# Patient Record
Sex: Female | Born: 1945 | Race: White | Hispanic: No | Marital: Married | Smoking: Never smoker
Health system: Southern US, Community
[De-identification: ages and names within clinical notes are randomized; demographics above are authoritative.]

## PROBLEM LIST (undated history)

## (undated) DIAGNOSIS — Z8709 Personal history of other diseases of the respiratory system: Secondary | ICD-10-CM

## (undated) DIAGNOSIS — K219 Gastro-esophageal reflux disease without esophagitis: Secondary | ICD-10-CM

## (undated) DIAGNOSIS — Z8679 Personal history of other diseases of the circulatory system: Secondary | ICD-10-CM

## (undated) DIAGNOSIS — M5030 Other cervical disc degeneration, unspecified cervical region: Secondary | ICD-10-CM

## (undated) DIAGNOSIS — G459 Transient cerebral ischemic attack, unspecified: Secondary | ICD-10-CM

## (undated) DIAGNOSIS — I10 Essential (primary) hypertension: Secondary | ICD-10-CM

## (undated) DIAGNOSIS — E785 Hyperlipidemia, unspecified: Secondary | ICD-10-CM

## (undated) DIAGNOSIS — Z8701 Personal history of pneumonia (recurrent): Secondary | ICD-10-CM

## (undated) DIAGNOSIS — R011 Cardiac murmur, unspecified: Secondary | ICD-10-CM

## (undated) DIAGNOSIS — D649 Anemia, unspecified: Secondary | ICD-10-CM

## (undated) DIAGNOSIS — G629 Polyneuropathy, unspecified: Secondary | ICD-10-CM

## (undated) DIAGNOSIS — N183 Chronic kidney disease, stage 3 unspecified: Secondary | ICD-10-CM

## (undated) DIAGNOSIS — C541 Malignant neoplasm of endometrium: Secondary | ICD-10-CM

## (undated) DIAGNOSIS — Z9289 Personal history of other medical treatment: Secondary | ICD-10-CM

## (undated) DIAGNOSIS — J189 Pneumonia, unspecified organism: Secondary | ICD-10-CM

## (undated) DIAGNOSIS — J45909 Unspecified asthma, uncomplicated: Secondary | ICD-10-CM

## (undated) DIAGNOSIS — Z951 Presence of aortocoronary bypass graft: Secondary | ICD-10-CM

## (undated) DIAGNOSIS — M1990 Unspecified osteoarthritis, unspecified site: Secondary | ICD-10-CM

## (undated) DIAGNOSIS — E119 Type 2 diabetes mellitus without complications: Secondary | ICD-10-CM

## (undated) DIAGNOSIS — H9190 Unspecified hearing loss, unspecified ear: Secondary | ICD-10-CM

## (undated) DIAGNOSIS — R0600 Dyspnea, unspecified: Secondary | ICD-10-CM

## (undated) DIAGNOSIS — I639 Cerebral infarction, unspecified: Secondary | ICD-10-CM

## (undated) DIAGNOSIS — C649 Malignant neoplasm of unspecified kidney, except renal pelvis: Secondary | ICD-10-CM

## (undated) DIAGNOSIS — I2510 Atherosclerotic heart disease of native coronary artery without angina pectoris: Secondary | ICD-10-CM

## (undated) DIAGNOSIS — R7881 Bacteremia: Secondary | ICD-10-CM

## (undated) HISTORY — DX: Anemia, unspecified: D64.9

## (undated) HISTORY — PX: COLONOSCOPY: SHX174

## (undated) HISTORY — PX: CHOLECYSTECTOMY: SHX55

## (undated) HISTORY — DX: Personal history of pneumonia (recurrent): Z87.01

## (undated) HISTORY — PX: TUBAL LIGATION: SHX77

## (undated) HISTORY — PX: TONSILLECTOMY: SUR1361

## (undated) HISTORY — DX: Atherosclerotic heart disease of native coronary artery without angina pectoris: I25.10

## (undated) HISTORY — PX: WISDOM TOOTH EXTRACTION: SHX21

## (undated) HISTORY — DX: Bacteremia: R78.81

## (undated) HISTORY — DX: Hyperlipidemia, unspecified: E78.5

## (undated) HISTORY — DX: Presence of aortocoronary bypass graft: Z95.1

## (undated) HISTORY — DX: Chronic kidney disease, stage 3 unspecified: N18.30

## (undated) HISTORY — DX: Personal history of other diseases of the circulatory system: Z86.79

## (undated) HISTORY — DX: Personal history of other diseases of the respiratory system: Z87.09

## (undated) HISTORY — DX: Essential (primary) hypertension: I10

## (undated) HISTORY — PX: UPPER GI ENDOSCOPY: SHX6162

## (undated) HISTORY — DX: Type 2 diabetes mellitus without complications: E11.9

## (undated) HISTORY — PX: CAROTID ENDARTERECTOMY: SUR193

## (undated) HISTORY — DX: Malignant neoplasm of unspecified kidney, except renal pelvis: C64.9

## (undated) HISTORY — DX: Chronic kidney disease, stage 3 (moderate): N18.3

## (undated) HISTORY — PX: EYE SURGERY: SHX253

---

## 2003-07-22 ENCOUNTER — Ambulatory Visit (HOSPITAL_COMMUNITY): Admission: RE | Admit: 2003-07-22 | Discharge: 2003-07-22 | Payer: Self-pay | Admitting: Gastroenterology

## 2003-09-04 ENCOUNTER — Encounter: Admission: RE | Admit: 2003-09-04 | Discharge: 2003-09-04 | Payer: Self-pay | Admitting: Family Medicine

## 2005-09-08 ENCOUNTER — Inpatient Hospital Stay (HOSPITAL_COMMUNITY): Admission: RE | Admit: 2005-09-08 | Discharge: 2005-09-09 | Payer: Self-pay | Admitting: Neurology

## 2005-09-08 ENCOUNTER — Encounter: Payer: Self-pay | Admitting: Cardiology

## 2005-09-08 ENCOUNTER — Ambulatory Visit: Payer: Self-pay | Admitting: Cardiology

## 2005-10-11 ENCOUNTER — Inpatient Hospital Stay (HOSPITAL_COMMUNITY): Admission: RE | Admit: 2005-10-11 | Discharge: 2005-10-13 | Payer: Self-pay | Admitting: Neurosurgery

## 2005-10-11 HISTORY — PX: ANTERIOR CERVICAL DECOMP/DISCECTOMY FUSION: SHX1161

## 2006-05-12 ENCOUNTER — Ambulatory Visit (HOSPITAL_COMMUNITY): Admission: RE | Admit: 2006-05-12 | Discharge: 2006-05-12 | Payer: Self-pay | Admitting: Neurosurgery

## 2007-07-26 HISTORY — PX: NM MYOCAR SINGLE W/SPECT: HXRAD625

## 2008-10-11 ENCOUNTER — Encounter: Admission: RE | Admit: 2008-10-11 | Discharge: 2008-10-11 | Payer: Self-pay | Admitting: Nephrology

## 2008-11-18 ENCOUNTER — Inpatient Hospital Stay (HOSPITAL_COMMUNITY): Admission: RE | Admit: 2008-11-18 | Discharge: 2008-11-26 | Payer: Self-pay | Admitting: Urology

## 2008-11-18 ENCOUNTER — Encounter (INDEPENDENT_AMBULATORY_CARE_PROVIDER_SITE_OTHER): Payer: Self-pay | Admitting: Urology

## 2008-11-18 DIAGNOSIS — C649 Malignant neoplasm of unspecified kidney, except renal pelvis: Secondary | ICD-10-CM

## 2008-11-18 HISTORY — PX: PARTIAL NEPHRECTOMY: SHX414

## 2008-11-18 HISTORY — DX: Malignant neoplasm of unspecified kidney, except renal pelvis: C64.9

## 2008-12-04 ENCOUNTER — Inpatient Hospital Stay (HOSPITAL_COMMUNITY): Admission: EM | Admit: 2008-12-04 | Discharge: 2008-12-07 | Payer: Self-pay | Admitting: Emergency Medicine

## 2008-12-31 ENCOUNTER — Encounter (HOSPITAL_COMMUNITY): Admission: RE | Admit: 2008-12-31 | Discharge: 2009-03-19 | Payer: Self-pay | Admitting: Nephrology

## 2009-06-04 ENCOUNTER — Ambulatory Visit (HOSPITAL_COMMUNITY): Admission: RE | Admit: 2009-06-04 | Discharge: 2009-06-04 | Payer: Self-pay | Admitting: Urology

## 2009-12-19 ENCOUNTER — Ambulatory Visit (HOSPITAL_COMMUNITY)
Admission: RE | Admit: 2009-12-19 | Discharge: 2009-12-19 | Payer: Self-pay | Source: Home / Self Care | Attending: Urology | Admitting: Urology

## 2010-04-15 LAB — HEMOGLOBIN: Hemoglobin: 7.4 g/dL — ABNORMAL LOW (ref 12.0–15.0)

## 2010-04-15 LAB — PROTIME-INR
INR: 1.08 (ref 0.00–1.49)
Prothrombin Time: 13.9 seconds (ref 11.6–15.2)

## 2010-04-15 LAB — HEMOGLOBIN AND HEMATOCRIT, BLOOD
HCT: 22.1 % — ABNORMAL LOW (ref 36.0–46.0)
HCT: 19.8 % — ABNORMAL LOW (ref 36.0–46.0)
Hemoglobin: 7.6 g/dL — ABNORMAL LOW (ref 12.0–15.0)
Hemoglobin: 7.1 g/dL — ABNORMAL LOW (ref 12.0–15.0)

## 2010-04-15 LAB — CBC
HCT: 28 % — ABNORMAL LOW (ref 36.0–46.0)
Hemoglobin: 7.6 g/dL — ABNORMAL LOW (ref 12.0–15.0)
MCHC: 34.6 g/dL (ref 30.0–36.0)
MCHC: 34.8 g/dL (ref 30.0–36.0)
MCHC: 35 g/dL (ref 30.0–36.0)
MCHC: 34.6 g/dL (ref 30.0–36.0)
MCHC: 35.5 g/dL (ref 30.0–36.0)
MCV: 94.8 fL (ref 78.0–100.0)
MCV: 96.8 fL (ref 78.0–100.0)
MCV: 97.1 fL (ref 78.0–100.0)
Platelets: 264 10*3/uL (ref 150–400)
Platelets: 271 10*3/uL (ref 150–400)
Platelets: 104 10*3/uL — ABNORMAL LOW (ref 150–400)
Platelets: 123 10*3/uL — ABNORMAL LOW (ref 150–400)
Platelets: 92 10*3/uL — ABNORMAL LOW (ref 150–400)
RBC: 2.25 MIL/uL — ABNORMAL LOW (ref 3.87–5.11)
RBC: 2.52 MIL/uL — ABNORMAL LOW (ref 3.87–5.11)
RBC: 2.96 MIL/uL — ABNORMAL LOW (ref 3.87–5.11)
RBC: 2.97 MIL/uL — ABNORMAL LOW (ref 3.87–5.11)
RBC: 2.42 MIL/uL — ABNORMAL LOW (ref 3.87–5.11)
RDW: 12.4 % (ref 11.5–15.5)
RDW: 13.5 % (ref 11.5–15.5)
RDW: 13.7 % (ref 11.5–15.5)
WBC: 7.1 10*3/uL (ref 4.0–10.5)
WBC: 8.1 10*3/uL (ref 4.0–10.5)
WBC: 10.4 10*3/uL (ref 4.0–10.5)
WBC: 5.9 10*3/uL (ref 4.0–10.5)

## 2010-04-15 LAB — BASIC METABOLIC PANEL
BUN: 24 mg/dL — ABNORMAL HIGH (ref 6–23)
BUN: 27 mg/dL — ABNORMAL HIGH (ref 6–23)
BUN: 30 mg/dL — ABNORMAL HIGH (ref 6–23)
BUN: 33 mg/dL — ABNORMAL HIGH (ref 6–23)
BUN: 35 mg/dL — ABNORMAL HIGH (ref 6–23)
CO2: 26 mEq/L (ref 19–32)
CO2: 24 mEq/L (ref 19–32)
CO2: 20 mEq/L (ref 19–32)
CO2: 23 mEq/L (ref 19–32)
CO2: 23 mEq/L (ref 19–32)
CO2: 23 mEq/L (ref 19–32)
CO2: 24 mEq/L (ref 19–32)
CO2: 25 mEq/L (ref 19–32)
CO2: 25 mEq/L (ref 19–32)
CO2: 28 mEq/L (ref 19–32)
Calcium: 8 mg/dL — ABNORMAL LOW (ref 8.4–10.5)
Calcium: 8.1 mg/dL — ABNORMAL LOW (ref 8.4–10.5)
Calcium: 8.1 mg/dL — ABNORMAL LOW (ref 8.4–10.5)
Calcium: 8.1 mg/dL — ABNORMAL LOW (ref 8.4–10.5)
Calcium: 8.1 mg/dL — ABNORMAL LOW (ref 8.4–10.5)
Calcium: 8.3 mg/dL — ABNORMAL LOW (ref 8.4–10.5)
Chloride: 100 mEq/L (ref 96–112)
Chloride: 97 mEq/L (ref 96–112)
Chloride: 112 mEq/L (ref 96–112)
Chloride: 103 mEq/L (ref 96–112)
Chloride: 105 mEq/L (ref 96–112)
Chloride: 106 mEq/L (ref 96–112)
Chloride: 108 mEq/L (ref 96–112)
Chloride: 108 mEq/L (ref 96–112)
Chloride: 108 mEq/L (ref 96–112)
Creatinine, Ser: 2.06 mg/dL — ABNORMAL HIGH (ref 0.4–1.2)
Creatinine, Ser: 1.78 mg/dL — ABNORMAL HIGH (ref 0.4–1.2)
Creatinine, Ser: 1.96 mg/dL — ABNORMAL HIGH (ref 0.4–1.2)
Creatinine, Ser: 2.03 mg/dL — ABNORMAL HIGH (ref 0.4–1.2)
Creatinine, Ser: 2.18 mg/dL — ABNORMAL HIGH (ref 0.4–1.2)
Creatinine, Ser: 2.34 mg/dL — ABNORMAL HIGH (ref 0.4–1.2)
Creatinine, Ser: 2.64 mg/dL — ABNORMAL HIGH (ref 0.4–1.2)
Creatinine, Ser: 2.68 mg/dL — ABNORMAL HIGH (ref 0.4–1.2)
GFR calc Af Amer: 31 mL/min — ABNORMAL LOW (ref >60)
GFR calc Af Amer: 35 mL/min — ABNORMAL LOW (ref >60)
GFR calc Af Amer: 29 mL/min — ABNORMAL LOW (ref >60)
GFR calc Af Amer: 28 mL/min — ABNORMAL LOW (ref >60)
GFR calc Af Amer: 29 mL/min — ABNORMAL LOW (ref >60)
GFR calc Af Amer: 30 mL/min — ABNORMAL LOW (ref >60)
GFR calc Af Amer: 31 mL/min — ABNORMAL LOW (ref >60)
GFR calc non Af Amer: 29 mL/min — ABNORMAL LOW (ref >60)
GFR calc non Af Amer: 24 mL/min — ABNORMAL LOW (ref >60)
GFR calc non Af Amer: 25 mL/min — ABNORMAL LOW (ref >60)
GFR calc non Af Amer: 26 mL/min — ABNORMAL LOW (ref >60)
Glucose, Bld: 150 mg/dL — ABNORMAL HIGH (ref 70–99)
Glucose, Bld: 116 mg/dL — ABNORMAL HIGH (ref 70–99)
Glucose, Bld: 118 mg/dL — ABNORMAL HIGH (ref 70–99)
Glucose, Bld: 159 mg/dL — ABNORMAL HIGH (ref 70–99)
Glucose, Bld: 176 mg/dL — ABNORMAL HIGH (ref 70–99)
Potassium: 4.5 mEq/L (ref 3.5–5.1)
Potassium: 3.7 mEq/L (ref 3.5–5.1)
Potassium: 3.3 mEq/L — ABNORMAL LOW (ref 3.5–5.1)
Potassium: 3.4 mEq/L — ABNORMAL LOW (ref 3.5–5.1)
Sodium: 134 mEq/L — ABNORMAL LOW (ref 135–145)
Sodium: 130 mEq/L — ABNORMAL LOW (ref 135–145)
Sodium: 139 mEq/L (ref 135–145)
Sodium: 141 mEq/L (ref 135–145)

## 2010-04-15 LAB — URINALYSIS, ROUTINE W REFLEX MICROSCOPIC
Bilirubin Urine: NEGATIVE
Glucose, UA: NEGATIVE mg/dL
Hgb urine dipstick: NEGATIVE
Nitrite: NEGATIVE
Specific Gravity, Urine: 1.011 (ref 1.005–1.030)
pH: 7.5 (ref 5.0–8.0)

## 2010-04-15 LAB — CROSSMATCH

## 2010-04-15 LAB — DIFFERENTIAL
Basophils Absolute: 0 10*3/uL (ref 0.0–0.1)
Basophils Relative: 0 % (ref 0–1)
Eosinophils Absolute: 0.1 10*3/uL (ref 0.0–0.7)
Eosinophils Relative: 2 % (ref 0–5)
Monocytes Absolute: 0.5 10*3/uL (ref 0.1–1.0)
Monocytes Relative: 9 % (ref 3–12)

## 2010-04-15 LAB — URINE MICROSCOPIC-ADD ON

## 2010-04-15 LAB — HEMATOCRIT: HCT: 20.9 % — ABNORMAL LOW (ref 36.0–46.0)

## 2010-04-15 LAB — TYPE AND SCREEN: ABO/RH(D): A POS

## 2010-04-15 LAB — APTT: aPTT: 30 seconds (ref 24–37)

## 2010-04-15 LAB — ABO/RH: ABO/RH(D): A POS

## 2010-05-29 NOTE — Procedures (Signed)
HISTORY:  [age]-year-old woman with a history of fall and subarachnoid  hemorrhage.  Study is being done to look for the presence of seizures.   PROCEDURE:  The procedure is carried out on a 32-channel digital Cadwell  recorder reformatted into 16-channel montages with one devoted to EKG.  The  patient was awake and drowsy during the recording.  The International 7/20  system lead placement was used.   DESCRIPTION OF FINDINGS:  Dominant frequency is in 10-20 microvolt 8 Hz  activity that is well regulated.  Background activity shows mixed frequency  theta range components.  The patient appears to be somewhat drowsy at this  time.  Light natural sleep was not achieved.  The patient was aroused with  agitation and significant motion artifact.  Photic stimulation was not well  seen.  EKG showed regular sinus rhythm with ventricular response of 66 beats  per minute.   IMPRESSION:  Normal record with the patient awake and drowsy.      Princess Bruins. Gaynell Face, M.D.  Electronically Signed     [id number]  D:  09/09/2005 13:28:44  T:  09/10/2005 08:23:09  Job #:  [id number]   cc:   Hosie Spangle, M.D.  Fax: [phone]   Alyson Locket. Love, M.D.  Fax: [phone]

## 2010-05-29 NOTE — Op Note (Signed)
NAME:  Dana Jackson, Dana Jackson                        ACCOUNT NO.:  [id number]   MEDICAL RECORD NO.:  [id number]                   PATIENT TYPE:  AMB   LOCATION:  ENDO                                 FACILITY:  [facility]   PHYSICIAN:  Nelwyn Salisbury, M.D.               DATE OF BIRTH:  Mar 03, 1945   DATE OF PROCEDURE:  07/22/2003  DATE OF DISCHARGE:                                 OPERATIVE REPORT   PROCEDURE PERFORMED:  Screening colonoscopy.   ENDOSCOPIST:  Nelwyn Salisbury, M.D.   INSTRUMENT USED:  Olympus video colonoscope.   INDICATION FOR PROCEDURE:  A 65 year old white female with a history of  occasional BRBPR, undergoing a screening colonoscopy.  Rule out colonic  polyps, masses, etc.   PREPROCEDURE PREPARATION:  Informed consent was procured from the patient.  The patient was fasted for eight hours prior to the procedure and prepped  with a bottle of magnesium citrate and a gallon of GoLYTELY the night prior  to the procedure.   PREPROCEDURE PHYSICAL:  VITAL SIGNS:  The patient had stable vital signs.  NECK:  Supple.  CHEST:  Clear to auscultation.  S1, S2 regular.  ABDOMEN:  Soft with normal bowel sounds.   DESCRIPTION OF PROCEDURE:  The patient was placed in the left lateral  decubitus position and sedated with 50 mg of Demerol and 5 mg of Versed in  slow incremental doses.  Once the patient was adequately sedate and  maintained on low-flow oxygen and continuous cardiac monitoring, the Olympus  video colonoscope was advanced from the rectum to the cecum.  The  appendiceal orifice and the ileocecal valve were visualized and  photographed.  There was some residual stool in the colon.  Multiple washes  were done.  No masses, polyps, erosions, ulcerations, or diverticula were  seen.  Small lesions could have been missed.  Retroflexion in the rectum  revealed small hemorrhoids.  The patient tolerated the procedure well  without immediate complications.   IMPRESSION:  1.  Essentially unrevealing colonoscopy up to the cecum except for small     internal hemorrhoids.  2. Some residual stool in the colon.  Small lesions could have been missed.   RECOMMENDATIONS:  1. Continue a high-fiber diet with liberal fluid intake.  2. Repeat colonoscopy in the next five to 10 years unless the patient     develops any abnormal symptoms in the     interim.  3. Outpatient follow-up if rectal bleeding continues in spite of soft     stools.  4. Use stool softeners and avoid laxatives as needed.                                               Nelwyn Salisbury, M.D.    JNM/MEDQ  D:  07/22/2003  T:  07/22/2003  Job:  [id number]   cc:   Youlanda Roys. Deatra Ina, M.D.  [address]  [place]  [place] [address]  Fax: [phone]   Stanford Scotland, P.A.-C.  [facility]

## 2010-05-29 NOTE — H&P (Signed)
[name], [name]:  [id number]   MEDICAL RECORD NO.:  [id number]          PATIENT TYPE:  INP   LOCATION:  2852                         FACILITY:  [facility]   PHYSICIAN:  Alyson Locket. Love, M.D.    DATE OF BIRTH:  29-May-1945   DATE OF ADMISSION:  09/08/2005  DATE OF DISCHARGE:                                HISTORY & PHYSICAL   This is one of several [facility] admissions for this 65 year old,  right-handed, white, married female admitted from short stay for evaluation  of aphasia and poor riding ability.   HISTORY OF PRESENT ILLNESS:  Dana Jackson has a known history of cervical spine  and lumbar spine disease and was in short stay to be admitted for anterior  diskectomy at C3-4, 4-5, 5-6 and 6-7 with fusion in the neck.  She had had a  history of neck and left arm pain with left hand numbness.  She fell  approximately 9 days prior to admission by tripping and struck her head  against the tank of a commode which cracked the tank.  She had no loss of  consciousness at the time.  Yesterday, she was noted to have problems with  her writing and was confused and today was reported to be confused by her  husband.  She had difficulty in getting the words out for her medications  when she came to the short stay to be preadmitted for surgery.  She had no  associated chest pain, palpitations or amaurosis fugax with this.   PAST MEDICAL HISTORY:  Significant for hypertension for 20 years, borderline  diabetes mellitus, TIA 25 years ago associated with right-sided numbness and  weakness with problems talking. Results of that evaluation are unknown.  She  had a cholecystectomy 30 years ago, tubal ligation, forehead cyst removal,  cervical disk disease and lumbar disk disease.  She has had hyperlipidemia.   SOCIAL HISTORY:  She is married and does not smoke cigarettes or drink  alcohol.  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   ALLERGIES:  She has an allergy to CODEINE causing nausea and vomiting and an  allergy to ERYTHROMYCIN causing nausea and vomiting.   MEDICATIONS:  1. Vitamin E 400 international units per day.  2. Multivitamins one daily.  3. Adderall XR 20 mg daily.  4  Etodolac 400 mg b.i.d.  1. Caltrate 600 mg b.i.d.  2. Aspirin 81 mg daily.  3. Lasix 1/2 of a 40 mg tablet daily.  4. Atenolol 50 mg daily.  5. Niaspan 2000 mg q.h.s.  6. Simvastatin 40 mg q.p.m.  7. Tramadol 50 mg q. a.m.   FAMILY HISTORY:  Mother died at 60 from kidney disease and multiple myeloma.  Her father is 29 and living well.  She has a brother 79, sister 68 and a  sister 70 living and well.   PHYSICAL EXAMINATION:  GENERAL:  Revealed a well-developed white female.  VITAL SIGNS:  Blood pressure right arm 150/80, left arm 150/80, heart rate  was 64.  NECK:  There were no bruits.  MENTAL STATUS:  She was alert and oriented x3.  CRANIAL NERVES:  Examination revealed visual fields full, disks flat.  Extraocular movements full.  Corneals present.  No seventh nerve palsy.  Tongue midline.  Uvula midline.  Gags present.  Sternocleidomastoid and  trapezius testing normal.  MOTOR EXAMINATION:  Revealed 5/5 strength in the upper and lower extremities  except for 4+/5 strength in the right arm. She had a mild outstretched hand  and arm tremor.  She had increased reflexes in her right arm and decreased  reflexes in her left arm and 2+ knee jerks and plantar responses that were  downgoing.   General examination revealed the tympanic membranes not seen. The thyroid  was not enlarged. Both lungs were listened to and there was no abnormal  sounds. The heart was without murmurs. Bowel sounds were normal. There was  no enlargement of the liver, spleen or kidneys.   LABORATORY DATA:  Cervical spine MRI was reviewed from [facility] showing swan-neck deformity with degenerative disk disease at  C3-  4, 4-5, 5-6 and 6-7 with spur formation as well narrowing the left C3-4 and  bilaterally the C5-6 formation. 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  CT scan of the brain showed  bilateral left greater than the right subcortical white matter disease. MRA  of the brain by MRI testing was normal. Extracranial MRA showed no  abnormalities.  White blood cell count was 9100, hemoglobin 13.1, hematocrit  36.9 platelets 240K. Sodium is 139, potassium 3.9, chloride 108, CO2 content  25, BUN 24, creatinine 1.2, glucose 93 on September 06, 2005.  EKG revealed  normal sinus rhythm.   IMPRESSION:  Left brain transient ischemic attack with aphasia, code 435.9  and  784.3.   PLAN:  Admit the patient for further evaluation.           ______________________________  Alyson Locket. Erling Cruz, M.D.     JML/MEDQ  D:  09/08/2005  T:  09/08/2005  Job:  [id number]

## 2010-05-29 NOTE — Discharge Summary (Signed)
NAMEELIAS, HENIGAN              ACCOUNT NO.:  [id number]   MEDICAL RECORD NO.:  [id number]          PATIENT TYPE:  INP   LOCATION:  3012                         FACILITY:  [facility]   PHYSICIAN:  Pramod P. Leonie Man, MD    DATE OF BIRTH:  [birth year]   DATE OF ADMISSION:  09/08/2005  DATE OF DISCHARGE:  09/09/2005                                 DISCHARGE SUMMARY   DIAGNOSES AT TIME OF DISCHARGE:  1. Left brain transient ischemic attack.  2. Recent fall.  3. Cervical spine disease, requiring surgery within the next two to three      weeks.  4. Hypertension x20 years.  5. Borderline diabetes.  6. Transient ischemic attack 25 years ago, associated with right-sided      numbness and weakness with problems talking.  7. Cholecystectomy 30 years ago.  8. Tubal ligation.  9. Forehead cyst removal.  10.Cervical disk disease.  11.Lumbar disk disease.  12.Dyslipidemia.   MEDICINES AT TIME OF DISCHARGE:  1. Aspirin 325 mg a day.  2. Etodolac 400 mg b.i.d.  3. Atenolol 40 mg a day.  4. Lasix 20 mg a day.  5. Tramadol 50 mg 1 to 2 q.6 hours p.r.n.  6. Adderall XR 20 mg a day.  7. Niaspan 2000 mg at bedtime.  8. Simvastatin 40 mg at bedtime.  9. Multivitamin 1 a day.  10.Vitamin E 4000 units 1 a day.  11.Caltrate 600 plus D b.i.d.   STUDIES PERFORMED:  1. CT of the brain on admission.  Shows no acute abnormality.  Low density      scattered throughout the periventricular and subcortical white matter      disease are likely insignificant.  2. MRI of the brain.  Shows chronic ischemic changes.  No acute infarct.  3. MRA of the neck.  Shows proximal vertebral arteries are not well seen,      but appear to be widely patent to the base.  No carotid stenosis is      identified.  4. MRA of the head.  Negative.  5. Carotid Doppler.  Shows no ICA stenosis.  6. 2-D echocardiogram.  Shows normal ejection fraction and no embolic      source.  7. EKG shows normal sinus rhythm.   LABORATORY  STUDIES:  Show potassium 3.4, total protein 5.8, albumin 3.3.  Coagulation study is normal.  Hemoglobin A1c normal.  Amathrobin-3 70, sed  rate 28.  Hypercoagulable panel pending.  Homocysteine 7.5.  Lipids  anticoagulant not detected.  Protein C function was high at 196.  RPR  negative.  Urinalysis with moderate bilirubin.  Urine drug screen positive  for amphetamines.  CBC with red blood cells 3.81, hematocrit 35.1.  Cholesterol 219, triglycerides 268, HDL 36, and LDL 131.   HISTORY OF PRESENT ILLNESS:  [name] is a 65 year old right-  handed white female, who has a known history of cervical spine and lumbar  spine disease and was in short-stay to be admitted for anterior diskectomy  at C3-4, 4-5, 5-6, and 6-7 with fusion in the neck.  She had a history  of  neck and left arm pain, with left-hand numbness.  She fell approximately 9  days prior to admission, by tripping and struck her head against the tank of  a commode, which cracked the tank.  She had no loss of consciousness at that  time.  Yesterday, she was noted to have problems with her writing and was  confused, and today was reported to be confused by her husband.  She had  difficulty in getting her words out her medications, when she came to the  short-stay to be pre-admitted for surgery.  She had no associated chest  pain, palpitations, or amaurosis fugax with this.  She was not a TPA  candidate secondary to time.  She was admitted to the hospital for further  stroke evaluation.   HOSPITAL COURSE:  MRI was negative for acute infarct.  She has vascular  factors of hypertension, dyslipidemia, and borderline diabetes.  Her  hemoglobin A1c was normal.  She was recently changed from Lipitor to Zocor  and this is being managed by her primary care physician.  Goal LDL was less  than 130 and would like to increase this, but would refer this to the  primary.  The patient had no significant etiology for stroke and was   neurologically at baseline, and was discharged home with recommendations for  surgery in two to three weeks, secondary to TIA.   CONDITION AT DISCHARGE:  The patient is alert and oriented x3.  Appears to  be clear.  No aphasia.  Her chest is clear to auscultation.  Heart rate is  regular.  Her visual fields are full and extraocular movements are intact.  She has no arm droop.  Strength on the right side 5/5, strength on the left  is 4+/5 with mild increase in fine motor movements on the left and finger-  nose-finger on the left.  Her right arm does satellite around her left.  Of  note, per patient, her left side has always been weak.   DISCHARGE PLAN:  1. Discharge home with husband.  2. Increase aspirin to 325 mg a day.  3. Follow up with Dr. Sherwood Gambler.  He had recommendation for surgery in two      to three weeks.  4. Follow up with primary care physician for risk factor control.  Goal      LDL less than 130.  Will defer statin adjustment to primary care and      follow up with Dr. Leonie Man in two to three months.      Burnetta Sabin, N.P.    ______________________________  Kathie Rhodes. Leonie Man, MD    SB/MEDQ  D:  09/09/2005  T:  09/09/2005  Job:  [id number]   cc:   Pramod P. Leonie Man, MD  Hosie Spangle, M.D.  Juanita Craver, M.D.

## 2010-05-29 NOTE — Op Note (Signed)
Dana Jackson, KITCHELL NO.:  [id number]   MEDICAL RECORD NO.:  [id number]          PATIENT TYPE:  INP   LOCATION:  [id number]                         FACILITY:  [facility]   PHYSICIAN:  Hosie Spangle, M.D.DATE OF BIRTH:  1945/06/28   DATE OF PROCEDURE:  10/11/2005  DATE OF DISCHARGE:                                 OPERATIVE REPORT   PREOPERATIVE DIAGNOSES:  Cervical spondylolisthesis, cervical stenosis,  cervical degenerative disk disease, cervical spondylosis and cervical  radiculopathy.   POSTOPERATIVE DIAGNOSES:  Cervical spondylolisthesis, cervical stenosis,  cervical degenerative disk disease, cervical spondylosis and cervical  radiculopathy.   PROCEDURE:  C3-4, C4-5, C5-6 and C6-7 anterior cervical diskectomy and  arthrodesis with AVS PEEK interbody implants.  Infuse DBX putty, and Tether  cervical plating.   SURGEON:  Hosie Spangle, M.D.   ASSISTANT:  Christella Noa, and Judeth Horn R.N.   INDICATIONS:  Patient is a 65 year old woman who presented with left  cervical radiculopathy.  She was found to have advanced spondylosis with  degenerative disk disease at the C5-6 and C6-7 levels with a dynamic  degenerative spondylolisthesis C4 and 5, and a large left C3-4 osteophytic  neuroforaminal spur.  The decision was made to proceed with decompression  and arthrodesis at each level.   PROCEDURE:  The patient was brought to the operating room and placed under  general endotracheal anesthesia.  The patient was placed on 10 pounds of  halter traction and the neck was prepped with Betadine and soapy solution,  draped in a sterile fashion.  An oblique incision was made on the left side  of the neck.  The line of the incision was infiltrated with local anesthetic  with epinephrine.  Incision made and carried down to the subcutaneous tissue  and platysma.  Bipolar cautery was used to maintain hemostasis.  Dissection  was then carried out through an avascular plane, leaving  the  sternocleidomastoid, the carotid artery and jugular vein laterally, and the  trachea and esophagus medially.  The ventral aspect of the vertebral column  was identified and local osteophytes were taken, and the C3-4, C4-5, C5-6  and C6-7 intervertebral disk spaces were identified.  Diskectomy was begun  at each level with incision at the annulus, and continued with Mitek  curettes and pituitary rongeurs.  There was significant osteophytic  overgrowth anteriorly particularly at C5-6 and C6-7 that was carefully  removed.  Each of the disk space showed significant spondylitic  degeneration, and the cartilaginous endplates of the corresponding vertebrae  were removed using Mitek curettes along with the XMax drill.  The microscope  was then draped and brought onto the field to provide additional  magnification, illumination and visualization, and the diskectomy was  completed using microdissection and microsurgical technique.  There was  significant posterior osteophytic overgrowth at each level and this was  removed using the XMax drill along with 2-mm Kerrison punch with a thin foot  plate.  At each level, the spinal canal and thecal sac were decompressed,  and then attention was paid to the neuroforamina, and each of these were  decompressed with particular  attention being paid to the left C3-4  neuroforamen where a large osteophyte had been demonstrated.  In the end,  good decompression of all the neuroforamina was achieved.  Hemostasis was  established with the use of Gelfoam soaked in thrombin.   We then measured the height of the intervertebral disk spaces and selected 4-  mm implants for C5-6 and C6-7, and 5-mm implants for C3-4 and C4-5.  We used  AVS 12 x 14 mm x 0-degree implants.  Each of them was packed with DBX small  pledgets infused, and then more DBX, and then each of these implants was  positioned in the intervertebral disk space and caps were sunk.  We placed  the 5-mm  implants at C3-4 and C4-5 and the 4-mm implants at C5-6 and C6-7.  We then selected a 16-mm Tether cervical plate.  It was positioned over the  fusion construct.  Using the plate bender, we eliminated the lordosis of the  plate so that it lay against the vertebra more flatly.  We secured the place  with a variety of 4-mm variable angle screws, placing a pair of 13-mm screws  at C3, another pair at C7, placing 14-mm screws at C4 and C6, and placing a  16-mm screw at C5.  Each screw hole was drilled and tapped and the screws  placed in an alternating fashion.  Once all seven screws were in place,  final tightening was done.  The wound was irrigated with Bacitracin  solution.  Meticulous hemostasis was established and confirmed, and then we  proceeded with closure.   The platysma was closed with interrupted inverted 2-0 Vicryl sutures.  The  subcutaneous and subcuticular were closed with interrupted inverted 3-0  Vicryl sutures.  The skin was reapproximated with Dermabond.  The procedure  was tolerated well.  The estimated blood loss was 50 cc.  Sponge and needle  counts were correct.  Following surgery, the patient was placed in an Aspen  cervical collar, to be reversed in anesthetic, extubated and transferred to  the recovery room for further care.      Hosie Spangle, M.D.  Electronically Signed     RWN/MEDQ  D:  10/11/2005  T:  10/12/2005  Job:  [id number]

## 2010-11-20 ENCOUNTER — Other Ambulatory Visit (HOSPITAL_COMMUNITY): Payer: Self-pay | Admitting: Urology

## 2010-11-20 DIAGNOSIS — N289 Disorder of kidney and ureter, unspecified: Secondary | ICD-10-CM

## 2010-12-08 ENCOUNTER — Other Ambulatory Visit: Payer: Self-pay | Admitting: Cardiology

## 2010-12-08 HISTORY — PX: DOPPLER ECHOCARDIOGRAPHY: SHX263

## 2010-12-09 ENCOUNTER — Encounter (HOSPITAL_COMMUNITY): Payer: Self-pay | Admitting: Pharmacy Technician

## 2010-12-10 ENCOUNTER — Other Ambulatory Visit: Payer: Self-pay | Admitting: Cardiology

## 2010-12-12 DIAGNOSIS — I2510 Atherosclerotic heart disease of native coronary artery without angina pectoris: Secondary | ICD-10-CM

## 2010-12-12 DIAGNOSIS — Z951 Presence of aortocoronary bypass graft: Secondary | ICD-10-CM

## 2010-12-12 HISTORY — DX: Atherosclerotic heart disease of native coronary artery without angina pectoris: I25.10

## 2010-12-12 HISTORY — DX: Presence of aortocoronary bypass graft: Z95.1

## 2010-12-16 ENCOUNTER — Ambulatory Visit (HOSPITAL_COMMUNITY)
Admission: RE | Admit: 2010-12-16 | Discharge: 2010-12-16 | Disposition: A | Discharge status: Home / Self Care | Payer: Medicare Other | Source: Ambulatory Visit | Attending: Urology | Admitting: Urology

## 2010-12-16 DIAGNOSIS — Z85528 Personal history of other malignant neoplasm of kidney: Secondary | ICD-10-CM | POA: Insufficient documentation

## 2010-12-16 DIAGNOSIS — N289 Disorder of kidney and ureter, unspecified: Secondary | ICD-10-CM

## 2010-12-16 DIAGNOSIS — N281 Cyst of kidney, acquired: Secondary | ICD-10-CM | POA: Insufficient documentation

## 2010-12-16 DIAGNOSIS — Z905 Acquired absence of kidney: Secondary | ICD-10-CM | POA: Insufficient documentation

## 2010-12-23 ENCOUNTER — Other Ambulatory Visit: Payer: Self-pay

## 2010-12-23 ENCOUNTER — Inpatient Hospital Stay (HOSPITAL_COMMUNITY)
Admission: AD | Admit: 2010-12-23 | Discharge: 2010-12-29 | DRG: 234 | Disposition: A | Discharge status: Home / Self Care | Payer: Medicare Other | Source: Ambulatory Visit | Attending: Thoracic Surgery (Cardiothoracic Vascular Surgery) | Admitting: Thoracic Surgery (Cardiothoracic Vascular Surgery)

## 2010-12-23 ENCOUNTER — Encounter (HOSPITAL_COMMUNITY): Payer: Self-pay | Admitting: Cardiology

## 2010-12-23 ENCOUNTER — Inpatient Hospital Stay (HOSPITAL_COMMUNITY): Payer: Medicare Other

## 2010-12-23 DIAGNOSIS — C649 Malignant neoplasm of unspecified kidney, except renal pelvis: Secondary | ICD-10-CM | POA: Insufficient documentation

## 2010-12-23 DIAGNOSIS — E785 Hyperlipidemia, unspecified: Secondary | ICD-10-CM | POA: Diagnosis present

## 2010-12-23 DIAGNOSIS — Z8673 Personal history of transient ischemic attack (TIA), and cerebral infarction without residual deficits: Secondary | ICD-10-CM

## 2010-12-23 DIAGNOSIS — I209 Angina pectoris, unspecified: Secondary | ICD-10-CM | POA: Diagnosis present

## 2010-12-23 DIAGNOSIS — R079 Chest pain, unspecified: Secondary | ICD-10-CM | POA: Diagnosis present

## 2010-12-23 DIAGNOSIS — I2510 Atherosclerotic heart disease of native coronary artery without angina pectoris: Principal | ICD-10-CM

## 2010-12-23 DIAGNOSIS — N184 Chronic kidney disease, stage 4 (severe): Secondary | ICD-10-CM | POA: Diagnosis present

## 2010-12-23 DIAGNOSIS — Z85528 Personal history of other malignant neoplasm of kidney: Secondary | ICD-10-CM

## 2010-12-23 DIAGNOSIS — N183 Chronic kidney disease, stage 3 unspecified: Secondary | ICD-10-CM | POA: Diagnosis present

## 2010-12-23 DIAGNOSIS — D696 Thrombocytopenia, unspecified: Secondary | ICD-10-CM | POA: Diagnosis not present

## 2010-12-23 DIAGNOSIS — D62 Acute posthemorrhagic anemia: Secondary | ICD-10-CM | POA: Diagnosis not present

## 2010-12-23 DIAGNOSIS — Z951 Presence of aortocoronary bypass graft: Secondary | ICD-10-CM

## 2010-12-23 DIAGNOSIS — I129 Hypertensive chronic kidney disease with stage 1 through stage 4 chronic kidney disease, or unspecified chronic kidney disease: Secondary | ICD-10-CM | POA: Diagnosis present

## 2010-12-23 DIAGNOSIS — D649 Anemia, unspecified: Secondary | ICD-10-CM

## 2010-12-23 DIAGNOSIS — I10 Essential (primary) hypertension: Secondary | ICD-10-CM | POA: Diagnosis present

## 2010-12-23 HISTORY — DX: Gastro-esophageal reflux disease without esophagitis: K21.9

## 2010-12-23 HISTORY — DX: Transient cerebral ischemic attack, unspecified: G45.9

## 2010-12-23 HISTORY — DX: Unspecified osteoarthritis, unspecified site: M19.90

## 2010-12-23 HISTORY — DX: Other cervical disc degeneration, unspecified cervical region: M50.30

## 2010-12-23 HISTORY — DX: Cerebral infarction, unspecified: I63.9

## 2010-12-23 HISTORY — DX: Pneumonia, unspecified organism: J18.9

## 2010-12-23 LAB — HEPATIC FUNCTION PANEL
ALT: 23 U/L (ref 0–35)
AST: 21 U/L (ref 0–37)
Total Protein: 6.2 g/dL (ref 6.0–8.3)

## 2010-12-23 LAB — PROTIME-INR: Prothrombin Time: 13.6 seconds (ref 11.6–15.2)

## 2010-12-23 LAB — BASIC METABOLIC PANEL
BUN: 26 mg/dL — ABNORMAL HIGH (ref 6–23)
CO2: 26 mEq/L (ref 19–32)
Chloride: 103 mEq/L (ref 96–112)
GFR calc Af Amer: 41 mL/min — ABNORMAL LOW (ref >90)
Glucose, Bld: 149 mg/dL — ABNORMAL HIGH (ref 70–99)
Potassium: 4 mEq/L (ref 3.5–5.1)

## 2010-12-23 LAB — CARDIAC PANEL(CRET KIN+CKTOT+MB+TROPI)
CK, MB: 2.8 ng/mL (ref 0.3–4.0)
Troponin I: <0.3 ng/mL (ref <0.30)

## 2010-12-23 LAB — PHOSPHORUS: Phosphorus: 3.4 mg/dL (ref 2.3–4.6)

## 2010-12-23 MED ORDER — DIAZEPAM 5 MG PO TABS
5.0000 mg | ORAL_TABLET | ORAL | Status: AC
  Administered 2010-12-24: 5 mg via ORAL
  Filled 2010-12-23: qty 1

## 2010-12-23 MED ORDER — NIACIN ER (ANTIHYPERLIPIDEMIC) 500 MG PO TBCR
2000.0000 mg | EXTENDED_RELEASE_TABLET | ORAL | Status: DC
  Administered 2010-12-24 – 2010-12-29 (×2): 2000 mg via ORAL
  Filled 2010-12-23 (×4): qty 4

## 2010-12-23 MED ORDER — SODIUM CHLORIDE 0.9 % IJ SOLN: 3.0000 mL | Freq: Two times a day (BID) | INTRAMUSCULAR | Status: DC

## 2010-12-23 MED ORDER — ONDANSETRON HCL 4 MG/2ML IJ SOLN: 4.0000 mg | Freq: Four times a day (QID) | INTRAMUSCULAR | Status: DC | PRN

## 2010-12-23 MED ORDER — SODIUM CHLORIDE 0.9 % IJ SOLN: 3.0000 mL | INTRAMUSCULAR | Status: DC | PRN

## 2010-12-23 MED ORDER — ACETAMINOPHEN 325 MG PO TABS: 650.0000 mg | ORAL_TABLET | ORAL | Status: DC | PRN

## 2010-12-23 MED ORDER — SODIUM CHLORIDE 0.9 % IV SOLN
INTRAVENOUS | Status: DC
  Administered 2010-12-23: 68 mL/h via INTRAVENOUS
  Administered 2010-12-24: 08:00:00 via INTRAVENOUS

## 2010-12-23 MED ORDER — PANTOPRAZOLE SODIUM 40 MG PO TBEC
40.0000 mg | DELAYED_RELEASE_TABLET | Freq: Every day | ORAL | Status: DC
  Administered 2010-12-24 (×2): 40 mg via ORAL
  Filled 2010-12-23 (×2): qty 1

## 2010-12-23 MED ORDER — MAGNESIUM OXIDE 400 MG PO TABS
400.0000 mg | ORAL_TABLET | Freq: Every day | ORAL | Status: DC
  Administered 2010-12-24: 400 mg via ORAL
  Filled 2010-12-23 (×2): qty 1

## 2010-12-23 MED ORDER — SODIUM CHLORIDE 0.9 % IV SOLN: 250.0000 mL | INTRAVENOUS | Status: DC | PRN

## 2010-12-23 MED ORDER — SIMVASTATIN 20 MG PO TABS
20.0000 mg | ORAL_TABLET | Freq: Every day | ORAL | Status: DC
  Administered 2010-12-24 – 2010-12-28 (×4): 20 mg via ORAL
  Filled 2010-12-23 (×7): qty 1

## 2010-12-23 MED ORDER — ASPIRIN 81 MG PO CHEW
324.0000 mg | CHEWABLE_TABLET | ORAL | Status: AC
  Administered 2010-12-24: 324 mg via ORAL
  Filled 2010-12-23: qty 3

## 2010-12-23 NOTE — H&P (Signed)
History and Physical Interval Note:  NAME:  Dana Jackson   MRN: [id number] DOB:  12-17-1945   ADMIT DATE: 12/23/2010   12/23/2010 6:21 PM  Dana Jackson is a 65 y.o. female with a PMH of: HTN, HLD, CKD- Stage 3 (followed by Dr. 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  The chest discomfort is described as a "heaviness", and it comes on with less & less exertion, including simple activities around the heart.  This is also associated with shortness breath.  It has not occurred at rest.  Her symptoms have coincided with her discontinuing her Atenolol (stopped by her PMD due to concern over hypotension).   She has had and echocardiogram (11/27) demonstrating mild concentric LVH with normal LVEF & wall motion, aortic sclerosis and mitral annular calcification. Based upon her risk factors and the classic nature of her symptoms, concerning for progressive ("crescendo") angina, we determined the most direct course of action would be to proceed with invasive cardiac evaluation (over non-invasive) in order to most accurately determine the presence of obstructive CAD.  She was seen by Dr. Hassell Jackson, who noted concern for her renal status with catheterization.  He has asked that she hold her ARB the day prior to the procedure & be admitted for overnight hydration.  We will also check a CMP prior to her procedure.  Please see my most recent clinic note (11/27/2010)  printed out in the paper chart, for full details.  Mrs. Dana Jackson has presented this evening forpre-catheterization hydration for planned heart catheterization - scheduled for exertional chest heaviness and shortness of breath, concerning for Angina. The various methods of treatment have been discussed with the patient and family. After consideration of  risks, benefits and other options for treatment, the patient has consented to Procedure(s):  LEFT HEART CATHETERIZATION AND CORONARY ANGIOGRAPHY +/- AD HOC PERCUTANEOUS CORONARY INTERVENTION -- as a surgical intervention.   The patients' history has been reviewed, patient examined, no change in status from most recent note (dated 11/27/2010).  She will be assessed in the morning to ensure that she is stable for surgery. I have reviewed the patients' chart and labs, and will review the labs in the morning. Questions were answered to the patient's satisfaction.    [place] [place], [place]  [address]  [phone]  12/23/2010 6:21 PM

## 2010-12-23 NOTE — Progress Notes (Signed)
Pt. Had 8 beats of Ventricular Tachycardia. Pt. Stable, no chest pain, and vital signs stable. MD notified, no orders given but to closely monitor patient.

## 2010-12-24 ENCOUNTER — Encounter (HOSPITAL_COMMUNITY)
Admission: AD | Disposition: A | Discharge status: Home / Self Care | Payer: Self-pay | Source: Ambulatory Visit | Attending: Thoracic Surgery (Cardiothoracic Vascular Surgery)

## 2010-12-24 ENCOUNTER — Encounter (HOSPITAL_COMMUNITY): Payer: Self-pay | Admitting: Thoracic Surgery (Cardiothoracic Vascular Surgery)

## 2010-12-24 ENCOUNTER — Other Ambulatory Visit: Payer: Self-pay

## 2010-12-24 ENCOUNTER — Inpatient Hospital Stay (HOSPITAL_COMMUNITY): Payer: Medicare Other

## 2010-12-24 DIAGNOSIS — Z01810 Encounter for preprocedural cardiovascular examination: Secondary | ICD-10-CM

## 2010-12-24 DIAGNOSIS — I2510 Atherosclerotic heart disease of native coronary artery without angina pectoris: Secondary | ICD-10-CM

## 2010-12-24 HISTORY — PX: LEFT HEART CATHETERIZATION WITH CORONARY ANGIOGRAM: SHX5451

## 2010-12-24 HISTORY — PX: CARDIAC CATHETERIZATION: SHX172

## 2010-12-24 LAB — CBC
Hemoglobin: 12.2 g/dL (ref 12.0–15.0)
MCH: 31.7 pg (ref 26.0–34.0)
MCV: 92.1 fL (ref 78.0–100.0)
Platelets: 113 10*3/uL — ABNORMAL LOW (ref 150–400)
Platelets: 99 10*3/uL — ABNORMAL LOW (ref 150–400)
RBC: 3.85 MIL/uL — ABNORMAL LOW (ref 3.87–5.11)
RBC: 3.69 MIL/uL — ABNORMAL LOW (ref 3.87–5.11)
RDW: 11.5 % (ref 11.5–15.5)
WBC: 8.1 10*3/uL (ref 4.0–10.5)
WBC: 10.8 10*3/uL — ABNORMAL HIGH (ref 4.0–10.5)

## 2010-12-24 LAB — LIPID PANEL
HDL: 59 mg/dL (ref >39)
LDL Cholesterol: 77 mg/dL (ref 0–99)
Total CHOL/HDL Ratio: 2.5 RATIO
Triglycerides: 58 mg/dL (ref <150)

## 2010-12-24 LAB — PLATELET INHIBITION P2Y12: Platelet Function  P2Y12: 298 [PRU] (ref 194–418)

## 2010-12-24 LAB — CREATININE, SERUM
GFR calc Af Amer: 39 mL/min — ABNORMAL LOW (ref >90)
GFR calc non Af Amer: 34 mL/min — ABNORMAL LOW (ref >90)

## 2010-12-24 SURGERY — LEFT HEART CATHETERIZATION WITH CORONARY ANGIOGRAM
Anesthesia: LOCAL

## 2010-12-24 MED ORDER — HYDROCODONE-ACETAMINOPHEN 10-325 MG PO TABS: 1.0000 | ORAL_TABLET | Freq: Two times a day (BID) | ORAL | Status: DC

## 2010-12-24 MED ORDER — SODIUM CHLORIDE 0.9 % IV SOLN
0.1000 ug/kg/h | INTRAVENOUS | Status: AC
  Administered 2010-12-25: .2 ug/kg/h via INTRAVENOUS
  Filled 2010-12-24: qty 4

## 2010-12-24 MED ORDER — METOPROLOL TARTRATE 12.5 MG HALF TABLET
12.5000 mg | ORAL_TABLET | Freq: Once | ORAL | Status: AC
  Administered 2010-12-25: 12.5 mg via ORAL
  Filled 2010-12-24 (×2): qty 1

## 2010-12-24 MED ORDER — DIAZEPAM 5 MG PO TABS: 5.0000 mg | ORAL_TABLET | ORAL | Status: DC | PRN

## 2010-12-24 MED ORDER — EPINEPHRINE HCL 1 MG/ML IJ SOLN
0.5000 ug/min | INTRAMUSCULAR | Status: DC
  Filled 2010-12-24: qty 4

## 2010-12-24 MED ORDER — TEMAZEPAM 15 MG PO CAPS: 15.0000 mg | ORAL_CAPSULE | Freq: Once | ORAL | Status: AC | PRN

## 2010-12-24 MED ORDER — DEXTROSE 5 % IV SOLN
750.0000 mg | INTRAVENOUS | Status: DC
  Filled 2010-12-24: qty 750

## 2010-12-24 MED ORDER — CHLORHEXIDINE GLUCONATE 4 % EX LIQD
60.0000 mL | Freq: Once | CUTANEOUS | Status: AC
  Administered 2010-12-25: 4 via TOPICAL
  Filled 2010-12-24: qty 60

## 2010-12-24 MED ORDER — NITROGLYCERIN IN D5W 200-5 MCG/ML-% IV SOLN
2.0000 ug/min | INTRAVENOUS | Status: DC
  Filled 2010-12-24: qty 250

## 2010-12-24 MED ORDER — BISACODYL 5 MG PO TBEC: 5.0000 mg | DELAYED_RELEASE_TABLET | Freq: Once | ORAL | Status: DC

## 2010-12-24 MED ORDER — SODIUM CHLORIDE 0.9 % IV SOLN
INTRAVENOUS | Status: DC
  Filled 2010-12-24: qty 1

## 2010-12-24 MED ORDER — HYDROCODONE-ACETAMINOPHEN 5-325 MG PO TABS
2.0000 | ORAL_TABLET | Freq: Two times a day (BID) | ORAL | Status: DC
  Administered 2010-12-24: 1 via ORAL
  Filled 2010-12-24: qty 2

## 2010-12-24 MED ORDER — PLASMA-LYTE 148 IV SOLN
INTRAVENOUS | Status: AC
  Administered 2010-12-25: 10:00:00
  Filled 2010-12-24: qty 0.5

## 2010-12-24 MED ORDER — SODIUM CHLORIDE 0.9 % IV SOLN
INTRAVENOUS | Status: DC
  Filled 2010-12-24: qty 40

## 2010-12-24 MED ORDER — MIDAZOLAM HCL 2 MG/2ML IJ SOLN
INTRAMUSCULAR | Status: AC
  Filled 2010-12-24: qty 2

## 2010-12-24 MED ORDER — ALBUTEROL SULFATE (5 MG/ML) 0.5% IN NEBU
2.5000 mg | INHALATION_SOLUTION | Freq: Once | RESPIRATORY_TRACT | Status: AC
  Administered 2010-12-24: 2.5 mg via RESPIRATORY_TRACT

## 2010-12-24 MED ORDER — DOPAMINE-DEXTROSE 3.2-5 MG/ML-% IV SOLN
2.0000 ug/kg/min | INTRAVENOUS | Status: DC
  Filled 2010-12-24: qty 250

## 2010-12-24 MED ORDER — POTASSIUM CHLORIDE 2 MEQ/ML IV SOLN
80.0000 meq | INTRAVENOUS | Status: DC
  Filled 2010-12-24: qty 40

## 2010-12-24 MED ORDER — PHENYLEPHRINE HCL 10 MG/ML IJ SOLN
30.0000 ug/min | INTRAVENOUS | Status: DC
  Filled 2010-12-24: qty 2

## 2010-12-24 MED ORDER — SODIUM CHLORIDE 0.9 % IV SOLN: 250.0000 mL | INTRAVENOUS | Status: DC | PRN

## 2010-12-24 MED ORDER — ONDANSETRON HCL 4 MG/2ML IJ SOLN: 4.0000 mg | Freq: Four times a day (QID) | INTRAMUSCULAR | Status: DC | PRN

## 2010-12-24 MED ORDER — OLMESARTAN 10 MG HALF TABLET
10.0000 mg | ORAL_TABLET | Freq: Every day | ORAL | Status: DC
  Administered 2010-12-24: 10 mg via ORAL
  Filled 2010-12-24 (×2): qty 1

## 2010-12-24 MED ORDER — NITROGLYCERIN 0.2 MG/ML ON CALL CATH LAB
INTRAVENOUS | Status: AC
  Filled 2010-12-24: qty 1

## 2010-12-24 MED ORDER — MAGNESIUM SULFATE 50 % IJ SOLN
40.0000 meq | INTRAMUSCULAR | Status: DC
  Filled 2010-12-24: qty 10

## 2010-12-24 MED ORDER — FUROSEMIDE 20 MG PO TABS
20.0000 mg | ORAL_TABLET | Freq: Every day | ORAL | Status: DC
  Administered 2010-12-24: 20 mg via ORAL
  Filled 2010-12-24 (×2): qty 1

## 2010-12-24 MED ORDER — HEPARIN SODIUM (PORCINE) 5000 UNIT/ML IJ SOLN
5000.0000 [IU] | Freq: Three times a day (TID) | INTRAMUSCULAR | Status: DC
  Filled 2010-12-24 (×4): qty 1

## 2010-12-24 MED ORDER — SODIUM CHLORIDE 0.9 % IV SOLN: 1.0000 mL/kg/h | INTRAVENOUS | Status: AC

## 2010-12-24 MED ORDER — SODIUM CHLORIDE 0.9 % IJ SOLN
3.0000 mL | Freq: Two times a day (BID) | INTRAMUSCULAR | Status: DC
  Administered 2010-12-24 (×2): 3 mL via INTRAVENOUS

## 2010-12-24 MED ORDER — CHLORHEXIDINE GLUCONATE 4 % EX LIQD: 60.0000 mL | Freq: Once | CUTANEOUS | Status: DC

## 2010-12-24 MED ORDER — VANCOMYCIN HCL 1000 MG IV SOLR
1250.0000 mg | INTRAVENOUS | Status: AC
  Administered 2010-12-25: 1250 mg via INTRAVENOUS
  Filled 2010-12-24: qty 1250

## 2010-12-24 MED ORDER — ASPIRIN 81 MG PO CHEW
81.0000 mg | CHEWABLE_TABLET | Freq: Every day | ORAL | Status: DC
  Filled 2010-12-24: qty 1

## 2010-12-24 MED ORDER — CHLORHEXIDINE GLUCONATE 4 % EX LIQD
60.0000 mL | Freq: Once | CUTANEOUS | Status: AC
  Administered 2010-12-24: 4 via TOPICAL
  Filled 2010-12-24: qty 60

## 2010-12-24 MED ORDER — SODIUM CHLORIDE 0.9 % IJ SOLN: 3.0000 mL | INTRAMUSCULAR | Status: DC | PRN

## 2010-12-24 MED ORDER — FENTANYL CITRATE 0.05 MG/ML IJ SOLN
INTRAMUSCULAR | Status: AC
  Filled 2010-12-24: qty 2

## 2010-12-24 MED ORDER — DOPAMINE-DEXTROSE 3.2-5 MG/ML-% IV SOLN: 2.0000 ug/kg/min | INTRAVENOUS | Status: DC

## 2010-12-24 MED ORDER — LIDOCAINE HCL (PF) 1 % IJ SOLN
INTRAMUSCULAR | Status: AC
  Filled 2010-12-24: qty 30

## 2010-12-24 MED ORDER — VERAPAMIL HCL 2.5 MG/ML IV SOLN
INTRAVENOUS | Status: AC
  Filled 2010-12-24: qty 2

## 2010-12-24 MED ORDER — ATENOLOL 25 MG PO TABS
25.0000 mg | ORAL_TABLET | Freq: Every day | ORAL | Status: DC
  Administered 2010-12-24: 25 mg via ORAL
  Filled 2010-12-24 (×2): qty 1

## 2010-12-24 MED ORDER — ACETAMINOPHEN 325 MG PO TABS: 650.0000 mg | ORAL_TABLET | ORAL | Status: DC | PRN

## 2010-12-24 MED ORDER — HEPARIN (PORCINE) IN NACL 2-0.9 UNIT/ML-% IJ SOLN
INTRAMUSCULAR | Status: AC
  Filled 2010-12-24: qty 2000

## 2010-12-24 MED ORDER — DEXTROSE 5 % IV SOLN
1.5000 g | INTRAVENOUS | Status: AC
  Administered 2010-12-25: 750 mg via INTRAVENOUS
  Administered 2010-12-25: 1500 mg via INTRAVENOUS
  Filled 2010-12-24: qty 1.5

## 2010-12-24 NOTE — Progress Notes (Signed)
Upper extremity Dopplers for pre CABG completed at 15:40. Dana Jackson 12/24/2010, 4:30 PM

## 2010-12-24 NOTE — Brief Op Note (Signed)
12/23/2010 - 12/24/2010  10:23 AM  PATIENT:  Dana Jackson  65 y.o. female with a PMH of HTN, HLD, CKD-3 (s/p partial L nehprectomy for RCCA) who was admitted for overnight hydration prior to diagnostic cardiac catheterization.  She has been having progressively worsening chest tightness & shortness of breath with minimal exertion.  Overnight, she had a brief run of NSVT.  PRE-OPERATIVE DIAGNOSIS:  chest pain/shortness of breath  POST-OPERATIVE DIAGNOSIS:  Severe Multivessel CAD.  Diffusely calcified proximal LCA.  LM - tapers to distal ~40-50%  LAD -- proximal tubular ~70%, followed by diffuse 50-60% then mid focal ~80% @ D2 & SP3 takeoff, several septal trunks in this proximal region.  Moderate caliber D1-bifurcating (one branch diffusely diseased)  LCx - ostial 95%, calcified, remainder of vessel is large caliber, non-dominant vessel that terminates as a large branching OMB.  Small-moderate caliber RI / High OM1 -- diffusely diseased up to 70-80%, but perfuses large distribution  RCA - prox-distal with minimal luminal irregularities (worst is tubular 40%);   Prox RPDA long tubular 80-90%,  Large RPL system with ~3 PLBs, most significant lesion ~40%   Procedure(s): LEFT HEART CATHETERIZATION WITH CORONARY ANGIOGRAM - Via 5Fr R Radial Artery Access IC NITROGLYCERIN INJECTION:  200 mcg   Surgeon(s): Leonie Man, MD, MS   ASSISTANTS: Garen Lah, RCIS   ANESTHESIA:   local and IV sedation -- Valium 5mg  PO, IV: Versed 3 mg, Fentanyl 90mcg  EBL:   <31ml  TOURNIQUET:  TR BAND - 50ml air, 0942  DICTATION: .Note written in EPIC  PLAN OF CARE: Admit for overnight observation; CT Surgery Consult for CABG  PATIENT DISPOSITION:  PACU - hemodynamically stable.   Delay start of Pharmacological VTE agent (>24hrs) due to surgical blood loss or risk of bleeding: Yes

## 2010-12-24 NOTE — Progress Notes (Signed)
*  PRELIMINARY RESULTS*  Pre CABG Carotid Doppler has been performed. Bilateral:  No evidence of hemodynamically significant internal carotid artery stenosis.  Vertebral artery flow is antegrade.      Landry Mellow 12/24/2010, 3:50 PM

## 2010-12-24 NOTE — Progress Notes (Signed)
UR Completed.  Dana Jackson [id number] 12/24/2010

## 2010-12-24 NOTE — Consults (Signed)
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 The patient presents with recent onset symptoms of exertional chest discomfort and shortness of breath consistent with angina pectoris. She was referred to Dr. Ellyn Hack who scheduled her for elective cardiac catheterization. Cardiac catheterization demonstrates severe three-vessel coronary artery disease with normal left ventricular function. Cardiothoracic surgical consultation has been requested to consider elective surgical revascularization.  Past Medical History  Diagnosis Date  . Hypertension   . Renal insufficiency   . Hyperlipidemia   . Degenerative disc disease, lumbar   . Degenerative disc disease, cervical   . Renal cell carcinoma 11/18/2008    T2aNx s/p partial left nephrectomy  . TIA (transient ischemic attack)     Past Surgical History  Procedure Date  . Partial nephrectomy 11/18/2008    left partial nephrectomy for renal cell CA  . Anterior cervical decomp/discectomy fusion 10/11/2005    multi-level    Family History  Problem Relation Age of Onset  . Coronary artery disease Father   . Coronary artery disease Sister   . Coronary artery disease Brother     Social History History  Substance Use Topics  . Smoking status: Never Smoker   . Smokeless tobacco: Not on file  . Alcohol Use: Yes    Current Facility-Administered Medications  Medication Dose Route Frequency Provider Last Rate Last Dose  . 0.9 %  sodium chloride infusion  1  mL/kg/hr Intravenous Continuous Leonie Man      . 0.9 %  sodium chloride infusion  250 mL Intravenous PRN Leonie Man      . acetaminophen (TYLENOL) tablet 650 mg  650 mg Oral Q4H PRN Leonie Man      . albuterol (PROVENTIL) (5 MG/ML) 0.5% nebulizer solution 2.5 mg  2.5 mg Nebulization Once Rexene Alberts, MD      . aspirin chewable tablet 324 mg  324 mg Oral Pre-Cath Laverle Hobby, PA   324 mg at 12/24/10 0756  . aspirin chewable tablet 81 mg  81 mg Oral Daily Leonie Man      . atenolol (TENORMIN) tablet 25 mg  25 mg Oral Daily Leonie Man   25 mg at 12/24/10 1417  . diazepam (VALIUM) tablet 5 mg  5 mg Oral On Call Leonie Man   5 mg at 12/24/10 [id number]  . fentaNYL (SUBLIMAZE) 0.05 MG/ML injection           . furosemide (LASIX) tablet 20 mg  20 mg Oral Daily Leonie Man   20 mg at 12/24/10 1411  . heparin 2-0.9 UNIT/ML-% infusion           . heparin injection 5,000 Units  5,000 Units Subcutaneous Q8H Leonie Man      . HYDROcodone-acetaminophen (NORCO) 5-325 MG per tablet 2 tablet  2 tablet Oral Q12H Otila Back, PHARMD      . lidocaine (XYLOCAINE) 1 % injection           . midazolam (VERSED) 2 MG/2ML injection           .  midazolam (VERSED) 2 MG/2ML injection           . niacin (NIASPAN) CR tablet 2,000 mg  2,000 mg Oral QODAY Laverle Hobby, PA   2,000 mg at 12/24/10 0015  . nitroGLYCERIN (NTG ON-CALL) 0.2 mg/mL injection           . olmesartan (BENICAR) tablet 10 mg  10 mg Oral Daily Leonie Man   10 mg at 12/24/10 1413  . ondansetron (ZOFRAN) injection 4 mg  4 mg Intravenous Q6H PRN Leonie Man      . pantoprazole (PROTONIX) EC tablet 40 mg  40 mg Oral Q1200 Laverle Hobby, PA   40 mg at 12/24/10 1413  . simvastatin (ZOCOR) tablet 20 mg  20 mg Oral q1800 Laverle Hobby, PA      . sodium chloride 0.9 % injection 3 mL  3 mL Intravenous Q12H Leonie Man   3 mL at 12/24/10 1413  . sodium chloride 0.9 % injection 3 mL  3 mL  Intravenous PRN Leonie Man      . verapamil (ISOPTIN) 2.5 MG/ML injection           . DISCONTD: 0.9 %  sodium chloride infusion  250 mL Intravenous PRN Leonie Man      . DISCONTD: 0.9 %  sodium chloride infusion   Intravenous Continuous Leonie Man 68 mL/hr at 12/24/10 [id number]    . DISCONTD: 0.9 %  sodium chloride infusion  250 mL Intravenous PRN Laverle Hobby, PA      . DISCONTD: acetaminophen (TYLENOL) tablet 650 mg  650 mg Oral Q4H PRN Leonie Man      . DISCONTD: HYDROcodone-acetaminophen (NORCO) 10-325 MG per tablet 1 tablet  1 tablet Oral BID Leonie Man      . DISCONTD: magnesium oxide (MAG-OX) tablet 400 mg  400 mg Oral Daily Laverle Hobby, PA   400 mg at 12/24/10 0015  . DISCONTD: ondansetron (ZOFRAN) injection 4 mg  4 mg Intravenous Q6H PRN Leonie Man      . DISCONTD: sodium chloride 0.9 % injection 3 mL  3 mL Intravenous Q12H Leonie Man      . DISCONTD: sodium chloride 0.9 % injection 3 mL  3 mL Intravenous PRN Leonie Man      . DISCONTD: sodium chloride 0.9 % injection 3 mL  3 mL Intravenous Q12H Laverle Hobby, PA      . DISCONTD: sodium chloride 0.9 % injection 3 mL  3 mL Intravenous PRN Laverle Hobby, PA        Allergies  Allergen Reactions  . Codeine Nausea Only  . Erythromycin Itching    Review of Systems:  General:  normal appetite, normal energy   Respiratory:  no cough, no wheezing, no hemoptysis, no pain with inspiration or cough, no shortness of breath   Cardiac:  + exertional chest pain or tightness over last several weeks, associated with exertional SOB, no resting chest pain nor resting SOB, no PND, no orthopnea, no LE edema, no palpitations, no syncope  GI:   Occasional mild difficulty swallowing, occasional hematochezia but negative colonoscopy by report, no hematemesis, no melena, no constipation, no diarrhea   GU:   + mild dysuria, no urgency, no frequency   Musculoskeletal: + arthritis involving lower back and legs,  R>L  Vascular:  no pain suggestive of claudication   Neuro:   no recent symptoms suggestive of TIA's  although she had 2 spells several years ago, no seizures, no headaches, no peripheral neuropathy except occasional numbness and paresthesias right arm and hand  Endocrine:  Negative   HEENT:  no loose teeth or painful teeth,  no recent vision changes  Psych:   no anxiety, no depression    Physical Exam:   BP 133/70  Pulse 79  Temp(Src) 97.7 F (36.5 C) (Axillary)  Resp 15  Ht 5' (1.524 m)  Wt 67.4 kg (148 lb 9.4 oz)  BMI 29.02 kg/m2  SpO2 100%  General:    well-appearing  HEENT:  Unremarkable   Neck:   no JVD, no bruits, no adenopathy   Chest:   clear to auscultation, symmetrical breath sounds, no wheezes, no rhonchi   CV:   RRR, no  murmur   Abdomen:  soft, non-tender, no masses   Extremities:  warm, well-perfused, pulses diminished but palpable, no LE edema, no varicose veins nor other signs of venous insufficiency  Rectal/GU  Deferred  Neuro:   Grossly non-focal and symmetrical throughout  Skin:   Clean and dry, no rashes, no breakdown  Diagnostic Tests:  Echocardiogram (12/08/2010):   By report demonstrating mild concentric LVH with normal LVEF & wall motion, aortic sclerosis and mitral annular calcification (outpatient study - full report and films not currently available)   Cardiac catheterization (12/24/2010):  Severe Multivessel CAD. Diffusely calcified proximal LCA.  LM - tapers to distal ~40-50%  LAD -- proximal tubular ~70%, followed by diffuse 50-60% then mid focal ~80% @ D2 & SP3 takeoff, several septal trunks in this proximal region. Moderate caliber D1-bifurcating (one branch diffusely diseased)  LCx - ostial 95%, calcified, remainder of vessel is large caliber, non-dominant vessel that terminates as a large branching OMB.  Small-moderate caliber RI / High OM1 -- diffusely diseased up to 70-80%, but perfuses large distribution  RCA - prox-distal with minimal  luminal irregularities (worst is tubular 40%);  Prox RPDA long tubular 80-90%,  Large RPL system with ~3 PLBs, most significant lesion ~40%    Impression:  Severe three-vessel coronary artery disease with normal left ventricular function and recent onset symptoms of angina pectoris. I've reviewed the patient's cath films and agree that surgical revascularization would likely represent the best treatment option. The patient does have fairly diffuse disease with relatively small target vessels for grafting, but overall her distal target vessels appear graftable and her coronary anatomy is certainly not amenable to percutaneous coronary intervention. Risks of surgery will be slightly increased because of the patient's history of chronic renal insufficiency and possible cerebrovascular disease. However, overall the patient appears to be reasonably good candidate for coronary artery bypass grafting.  Plan:  I've discussed possible treatment options at length with the patient and her husband. The rationale for coronary artery bypass grafting has been discussed in his long-term prognosis compared in contrast with both medical therapy and percutaneous coronary intervention. The patient understands and accepts all potential associated risks of surgery including but not limited to risk of death, stroke, myocardial infarction, congestive heart failure, respiratory failure, renal failure, bleeding requiring blood transfusion and/or reexploration, arrhythmia, heart block or bradycardia requiring permanent pacemaker, pneumonia, pleural effusion, wound infection, pulmonary embolus or other thromboembolic complication, chronic pain or other delayed complications.  She also understands the risks of late recurrence of symptomatic coronary artery disease and the need to carefully address all of her underlying chronic medical conditions.  All questions answered.  We will repeat basic metabolic panel in the morning  and  tentatively plan to proceed with surgery tomorrow if her renal function is stable since catheterization.     Valentina Gu. Roxy Manns, MD 12/24/2010 3:57 PM

## 2010-12-24 NOTE — Plan of Care (Signed)
Problem: Phase I Progression Outcomes Goal: Verbalized level of anxiety/depression Outcome: Completed/Met Date Met:  12/24/10 Low level of anxiety towards surgery. Pt seems eager to learn and willing to follow through with the surgery.

## 2010-12-24 NOTE — Op Note (Signed)
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 She has been having progressively worsening chest tightness & shortness of breath with minimal exertion. Overnight, she had a brief run of NSVT.  Consent: The procedure with Risks/Benefits/Alternatives and Indications was reviewed with the patient (and family).  All questions were answered.    Risks / Complications include, but not limited to: Death, MI, CVA/TIA, VF/VT (with defibrillation), Bradycardia (need for temporary pacer placement), contrast induced nephropathy -- notably increased given her baseline renal insufficiency, bleeding / bruising / hematoma / pseudoaneurysm, vascular or coronary injury (with possible emergent CT or Vascular Surgery), adverse medication reactions, infection.    The patient (and family) voice understanding and agree to proceed.    Consent for signed by MD and patient with RN witness -- placed on chart.  Procedure: The patient was brought to the [facility] in the fasting state and prepped and draped in the usual sterile fashion for Right groin or radial access. A modified Allen's test with plethysmography was performed on the right wrist demonstrating adequate Ulnar Artery collateral flow.      Sterile technique was used including antiseptics, cap, gloves, gown, hand hygiene, mask and sheet.  Skin prep: Chlorhexidine;  Time Out: Verified patient identification, verified procedure, site/side was marked, verified correct patient position, special equipment/implants available, medications/allergies/relevent history reviewed, required imaging and test results available.  Performed  The right wrist was anesthetized with 1% subcutaneous Lidocaine.  The right radial artery was accessed using the Seldinger Technique with placement of a 5 Fr Glide Sheath. The sheath was aspirated and flushed.  Then a total of 10 ml of standard Radial Artery Cocktail (see medications) was infused.  Radial Cocktail: 5 mg Verapamil, 400 mcg NTG, 2 ml 2% Lidocaine  A 5 Fr TIG 4.0 Catheter was advanced of over a Long Exchange Safety J wire into the ascending Aorta.  The catheter was used to engage the Left and  Right Coronary Artery.  Multiple cineangiographic views of the left and right coronary artery system(s) were performed.  This catheter was then advanced across the Aortic Valve.  LV hemodynamics were measured, and the catheter was pulled back across the Aortic Valve for measurement of "pull-back" gradient.  The catheter and the wire was removed completely out of the body.  The sheath was removed in the Cath Lab with a TR band placed at 11 ml Air at 0942 hrs.  Reverse Allen's test revealed non-occlusive hemostasis).  The patient was transported to the PACU in stable condition.   The patient  was stable before, during and following the procedure.   Patient did tolerate procedure well. There were not complications.  EBL: <5 ml  Medications:  Premedication: 5 mg  Valium  Sedation:  3 mg IV Versed, 50 mcg IV Fentanyl  Contrast:  70 Omnipaque  IV Heparin:  4000 Units  Radial Cocktail: 5 mg Verapamil, 400 mcg NTG, 2 ml 2% Lidocaine  Hemodynamics:  Central Aortic Pressure / Mean Aortic Pressure:  108/62  mmHg; 81 mmHg  LV Pressure / LV End diastolic Pressure:  [id number] mmHg; 18 mmHg  Left Ventriculography:  EF:  55-60% by Echo  Wall Motion: Normal by Echo  Coronary Angiographic Data:  Right dominant  Diffuse calcification of the proximal LAD & LCx  Left Main:  Large caliber, tapers to ~40-50% distal stenosis.  Left Anterior Descending (LAD):  Just after D1 (yet very proximal) tubular 70-80%, after SP1 - 60%; midLAD, after D2 - 80-90% with 80% ostial SP3; distal LAD tapers to small caliber with apical ~60%.  1st diagonal (D1):  Small-moderate caliber bifurcating vessel, no clear evidence of CAD.  2nd diagonal (D2):  Small caliber, angiographically normal  Circumflex (LCx):  Large caliber, Ostial / calcified ~95% lesion, after this the vessel continues as a large vessel gives rise to small atrioventricular groove branch before terminating in a large branching Obtuse Marginal branch filling minimal luminal irregularities  Ramus Intermedius:  Small caliber vessel, diffusely diseased anywhere from 70-80%.  Perfuses large distribution  Right Coronary Artery: Proximal to distal vessel minimal luminal irregularities; worst being tubular 40-50% distal.  Posterior Descending Artery: Long tubular 80-90%. Good distal target  Posterior Lateral System: Large system with 3 posterior lateral branches, minimal luminal irregularities most significant 40% in PL3  Impression: 1.  Severe multivessel coronary disease. Severely calcified proximal left coronary artery into the circumflex and LAD with distal LM 40-50%, ostial 95% circumflex, along with proximal 70% followed by 60% proximal LAD. Also mid 80% LAD and 90% proximal RPDA. 2.  Preserved Left Ventricular Ejection Fraction by echocardiogram. Normal LV EDP 3.  Chronic Kidney Disease Stage III -- status post partial Left Nephrectomy for Renal Renal Cell Carcinoma 4.  Brief episode of Nonsustained Ventricular Tachycardia on telemetry.  Plan: 1.  CT  surgery consultation for multivessel CABG. 2.  Post catheterization hydration for CK D. Standard post-radial care. 3.  Will that the patient overnight for potential CABG in the morning  The case and results was discussed with the patient (and family). The case and results was discussed with the patient's PCP. The case and results was discussed with the patient's Cardiologist.  Time Spend Directly with Patient:  1 hour  Jamie Belger W, M.D., M.S. [facility] [address] [place], [place]  [address]  [phone]  12/24/2010 6:45 PM

## 2010-12-24 NOTE — Progress Notes (Signed)
Patient ID: Dana Jackson, female   DOB: 1946/01/09, 65 y.o.   MRN: [id number] [facility]  DAILY PROGRESS NOTE   Subjective:  No chest pain or SOB.  Has not been exerting herself.  Walking around without difficulty. Has had some dysuria - UA sent last PM  Objective:  Temp:  [98.2 F (36.8 C)-99 F (37.2 C)] 98.2 F (36.8 C) (12/13 0700) Pulse Rate:  [80-91] 91  (12/13 0700) Resp:  [18] 18  (12/13 0700) BP: (139-153)/(77-87) 145/77 mmHg (12/13 0700) SpO2:  [95 %-98 %] 98 % (12/13 0700) Weight:  [67.4 kg (148 lb 9.4 oz)] 148 lb 9.4 oz (67.4 kg) (12/12 1800) Weight change:   Intake/Output from previous day: 12/12 0701 - 12/13 0700 In: -  Out: 400 [Urine:400]  Intake/Output from this shift:    Medications: Current Facility-Administered Medications  Medication Dose Route Frequency Provider Last Rate Last Dose  . 0.9 %  sodium chloride infusion  250 mL Intravenous PRN Leonie Man      . 0.9 %  sodium chloride infusion   Intravenous Continuous Leonie Man 68 mL/hr at 12/24/10 [id number]    . acetaminophen (TYLENOL) tablet 650 mg  650 mg Oral Q4H PRN Leonie Man      . aspirin chewable tablet 324 mg  324 mg Oral Pre-Cath Laverle Hobby, PA   324 mg at 12/24/10 0756  . diazepam (VALIUM) tablet 5 mg  5 mg Oral On Call Leonie Man      . magnesium oxide (MAG-OX) tablet 400 mg  400 mg Oral Daily Laverle Hobby, PA   400 mg at 12/24/10 0015  . niacin (NIASPAN) CR tablet 2,000 mg  2,000 mg Oral QODAY Laverle Hobby, PA   2,000 mg at 12/24/10 0015  . ondansetron (ZOFRAN) injection 4 mg  4 mg Intravenous Q6H PRN Leonie Man      . pantoprazole (PROTONIX) EC tablet 40 mg  40 mg Oral Q1200 Laverle Hobby, PA   40 mg at 12/24/10 0015  . simvastatin (ZOCOR) tablet 20 mg  20 mg Oral q1800 Laverle Hobby, PA      . sodium chloride 0.9 % injection 3 mL  3 mL Intravenous Q12H Leonie Man      . sodium chloride 0.9 % injection 3 mL  3 mL  Intravenous PRN Leonie Man      . DISCONTD: 0.9 %  sodium chloride infusion  250 mL Intravenous PRN Laverle Hobby, PA      . DISCONTD: sodium chloride 0.9 % injection 3 mL  3 mL Intravenous Q12H Laverle Hobby, PA      . DISCONTD: sodium chloride 0.9 % injection 3 mL  3 mL Intravenous PRN Laverle Hobby, PA        Physical Exam: General appearance: alert, cooperative, appears stated age and no distress Neck: no adenopathy, no carotid bruit, no JVD, supple, symmetrical, trachea midline and thyroid not enlarged, symmetric, no tenderness/mass/nodules Lungs: clear to auscultation bilaterally, normal percussion bilaterally and non-labored, no W/R/R Heart: normal apical impulse, regular rate and rhythm, S1, S2 normal and systolic murmur: systolic ejection 1/6, crescendo and decrescendo at 2nd right intercostal space Abdomen: soft, non-tender; bowel sounds normal; no masses,  no organomegaly Extremities: extremities normal, atraumatic, no cyanosis or edema Pulses: 2+ and symmetric Skin: Skin color, texture, turgor normal. No rashes or lesions Neurologic: Alert and oriented X 3, normal strength and  tone. Normal symmetric reflexes. Normal coordination and gait MALLAMPATI AIRWAY: #3  Lab Results: Results for orders placed during the hospital encounter of 12/23/10 (from the past 48 hour(s))  PHOSPHORUS     Status: Normal   Collection Time   12/23/10 10:43 PM      Component Value Range Comment   Phosphorus 3.4  2.3 - 4.6 (mg/dL)   MAGNESIUM     Status: Normal   Collection Time   12/23/10 10:43 PM      Component Value Range Comment   Magnesium 1.7  1.5 - 2.5 (mg/dL)   BASIC METABOLIC PANEL     Status: Abnormal   Collection Time   12/23/10 10:43 PM      Component Value Range Comment   Sodium 138  135 - 145 (mEq/L)    Potassium 4.0  3.5 - 5.1 (mEq/L)    Chloride 103  96 - 112 (mEq/L)    CO2 26  19 - 32 (mEq/L)    Glucose, Bld 149 (*) 70 - 99 (mg/dL)    BUN 26 (*) 6 - 23 (mg/dL)     Creatinine, Ser 1.48 (*) 0.50 - 1.10 (mg/dL)    Calcium 9.7  8.4 - 10.5 (mg/dL)    GFR calc non Af Amer 36 (*) >90 (mL/min)    GFR calc Af Amer 41 (*) >90 (mL/min)   HEPATIC FUNCTION PANEL     Status: Abnormal   Collection Time   12/23/10 10:43 PM      Component Value Range Comment   Total Protein 6.2  6.0 - 8.3 (g/dL)    Albumin 3.4 (*) 3.5 - 5.2 (g/dL)    AST 21  0 - 37 (U/L)    ALT 23  0 - 35 (U/L)    Alkaline Phosphatase 101  39 - 117 (U/L)    Total Bilirubin 0.3  0.3 - 1.2 (mg/dL)    Bilirubin, Direct <0.1  0.0 - 0.3 (mg/dL)    Indirect Bilirubin NOT CALCULATED  0.3 - 0.9 (mg/dL)   PROTIME-INR     Status: Normal   Collection Time   12/23/10 10:43 PM      Component Value Range Comment   Prothrombin Time 13.6  11.6 - 15.2 (seconds)    INR 1.02  0.00 - 1.49    CARDIAC PANEL(CRET KIN+CKTOT+MB+TROPI)     Status: Abnormal   Collection Time   12/23/10 10:43 PM      Component Value Range Comment   Total CK 104  7 - 177 (U/L)    CK, MB 2.8  0.3 - 4.0 (ng/mL)    Troponin I <0.30  <0.30 (ng/mL)    Relative Index 2.7 (*) 0.0 - 2.5    CBC     Status: Abnormal   Collection Time   12/23/10 10:43 PM      Component Value Range Comment   WBC 8.1  4.0 - 10.5 (K/uL)    RBC 3.85 (*) 3.87 - 5.11 (MIL/uL)    Hemoglobin 12.2  12.0 - 15.0 (g/dL)    HCT 35.4 (*) 36.0 - 46.0 (%)    MCV 91.9  78.0 - 100.0 (fL)    MCH 31.7  26.0 - 34.0 (pg)    MCHC 34.5  30.0 - 36.0 (g/dL)    RDW 11.4 (*) 11.5 - 15.5 (%)    Platelets 113 (*) 150 - 400 (K/uL) PLATELET COUNT CONFIRMED BY SMEAR  PLATELET INHIBITION P2Y12     Status: Normal   Collection Time  12/23/10 10:43 PM      Component Value Range Comment   Platelet Function  P2Y12 298  194 - 418 (PRU)    CXR: No abnormal findings (personally reviewed) Telemetry: short run ~6 beats of NSVT overnight  Assessment:  1. Principal Problem: 2.  *Chest pain on exertion 3. Active Problems: 4.  Chronic kidney disease (CKD), stage III (moderate) 5.   Hypertension 6.  Dyslipidemia 7.   Plan: 1. Left Heart Catheterization +/- PCI today 2. Creatinine stable 3. F/u UA - Rx UTI if indicated, no WBC elevation & afebrile.  Performing MD:  Leonie Man, M.D., M.S.  Procedure:  LEFT HEART CATHETERIZATION WITH CORONARY ANGIOGRAPHY POSSIBLE AD HOC PERCUTANEOUS CORONARY INTERVENTION  The procedure with Risks/Benefits/Alternatives and Indications was reviewed with the patient and husband.  All questions were answered.    Risks / Complications include, but not limited to: Death, MI, CVA/TIA, VF/VT (with defibrillation), Bradycardia (need for temporary pacer placement), contrast induced nephropathy -- increased risk with baseline CKD-III (pre-hydrated), bleeding / bruising / hematoma / pseudoaneurysm, vascular or coronary injury (with possible emergent CT or Vascular Surgery), adverse medication reactions, infection.    The patient (and family) voice understanding and agree to proceed.   Consent form placed it on the chart for patient signature and RN witness.     Leonie Man, M.D., M.S. [facility] [address] [place], [place]  [address]  [phone]  12/24/2010 8:38 AM    Time Spent Directly with Patient:  15 minutes  Length of Stay:  LOS: 1 day   Pixie Casino, MD Attending Cardiologist [facility] 12/24/2010, 8:33 AM

## 2010-12-25 ENCOUNTER — Encounter (HOSPITAL_COMMUNITY)
Admission: AD | Disposition: A | Discharge status: Home / Self Care | Payer: Self-pay | Source: Ambulatory Visit | Attending: Thoracic Surgery (Cardiothoracic Vascular Surgery)

## 2010-12-25 ENCOUNTER — Inpatient Hospital Stay (HOSPITAL_COMMUNITY): Payer: Medicare Other

## 2010-12-25 ENCOUNTER — Encounter (HOSPITAL_COMMUNITY): Payer: Self-pay | Admitting: Certified Registered Nurse Anesthetist

## 2010-12-25 ENCOUNTER — Other Ambulatory Visit: Payer: Self-pay

## 2010-12-25 ENCOUNTER — Inpatient Hospital Stay (HOSPITAL_COMMUNITY): Payer: Medicare Other | Admitting: Certified Registered Nurse Anesthetist

## 2010-12-25 DIAGNOSIS — I2510 Atherosclerotic heart disease of native coronary artery without angina pectoris: Secondary | ICD-10-CM

## 2010-12-25 DIAGNOSIS — Z951 Presence of aortocoronary bypass graft: Secondary | ICD-10-CM

## 2010-12-25 HISTORY — PX: CORONARY ARTERY BYPASS GRAFT: SHX141

## 2010-12-25 LAB — POCT I-STAT 4, (NA,K, GLUC, HGB,HCT)
Glucose, Bld: 99 mg/dL (ref 70–99)
Glucose, Bld: 141 mg/dL — ABNORMAL HIGH (ref 70–99)
Glucose, Bld: 190 mg/dL — ABNORMAL HIGH (ref 70–99)
Glucose, Bld: 214 mg/dL — ABNORMAL HIGH (ref 70–99)
Glucose, Bld: 154 mg/dL — ABNORMAL HIGH (ref 70–99)
HCT: 30 % — ABNORMAL LOW (ref 36.0–46.0)
HCT: 25 % — ABNORMAL LOW (ref 36.0–46.0)
HCT: 21 % — ABNORMAL LOW (ref 36.0–46.0)
Hemoglobin: 10.2 g/dL — ABNORMAL LOW (ref 12.0–15.0)
Hemoglobin: 5.8 g/dL — CL (ref 12.0–15.0)
Hemoglobin: 7.1 g/dL — ABNORMAL LOW (ref 12.0–15.0)
Potassium: 3.6 mEq/L (ref 3.5–5.1)
Potassium: 3.4 mEq/L — ABNORMAL LOW (ref 3.5–5.1)
Potassium: 5 mEq/L (ref 3.5–5.1)
Potassium: 4 mEq/L (ref 3.5–5.1)
Sodium: 139 mEq/L (ref 135–145)
Sodium: 137 mEq/L (ref 135–145)
Sodium: 137 mEq/L (ref 135–145)

## 2010-12-25 LAB — POCT I-STAT 3, ART BLOOD GAS (G3+)
Acid-base deficit: 1 mmol/L (ref 0.0–2.0)
Bicarbonate: 23.2 mEq/L (ref 20.0–24.0)
Bicarbonate: 22.8 mEq/L (ref 20.0–24.0)
Bicarbonate: 21.9 mEq/L (ref 20.0–24.0)
Bicarbonate: 21.2 mEq/L (ref 20.0–24.0)
O2 Saturation: 100 %
O2 Saturation: 99 %
Patient temperature: 35.9
Patient temperature: 37.2
Patient temperature: 37.4
TCO2: 24 mmol/L (ref 0–100)
TCO2: 24 mmol/L (ref 0–100)
TCO2: 25 mmol/L (ref 0–100)
TCO2: 23 mmol/L (ref 0–100)
pCO2 arterial: 38.5 mmHg (ref 35.0–45.0)
pCO2 arterial: 40.7 mmHg (ref 35.0–45.0)
pH, Arterial: 7.389 (ref 7.350–7.400)
pH, Arterial: 7.4 (ref 7.350–7.400)
pH, Arterial: 7.362 (ref 7.350–7.400)
pH, Arterial: 7.326 — ABNORMAL LOW (ref 7.350–7.400)
pO2, Arterial: 447 mmHg — ABNORMAL HIGH (ref 80.0–100.0)
pO2, Arterial: 414 mmHg — ABNORMAL HIGH (ref 80.0–100.0)
pO2, Arterial: 70 mmHg — ABNORMAL LOW (ref 80.0–100.0)

## 2010-12-25 LAB — CBC
HCT: 22.5 % — ABNORMAL LOW (ref 36.0–46.0)
HCT: 24.1 % — ABNORMAL LOW (ref 36.0–46.0)
Hemoglobin: 7.9 g/dL — ABNORMAL LOW (ref 12.0–15.0)
Hemoglobin: 8.5 g/dL — ABNORMAL LOW (ref 12.0–15.0)
MCH: 31.5 pg (ref 26.0–34.0)
MCH: 31.3 pg (ref 26.0–34.0)
MCHC: 34 g/dL (ref 30.0–36.0)
MCHC: 35.1 g/dL (ref 30.0–36.0)
MCV: 89.3 fL (ref 78.0–100.0)
Platelets: 104 10*3/uL — ABNORMAL LOW (ref 150–400)
RBC: 2.52 MIL/uL — ABNORMAL LOW (ref 3.87–5.11)
RBC: 2.7 MIL/uL — ABNORMAL LOW (ref 3.87–5.11)
RDW: 11.4 % — ABNORMAL LOW (ref 11.5–15.5)
WBC: 7.8 10*3/uL (ref 4.0–10.5)

## 2010-12-25 LAB — BLOOD GAS, ARTERIAL
Bicarbonate: 24.3 mEq/L — ABNORMAL HIGH (ref 20.0–24.0)
Drawn by: 31101
FIO2: 0.21 %
O2 Content: 0.2 L/min
O2 Saturation: 98 %
Patient temperature: 98.6

## 2010-12-25 LAB — PLATELET COUNT: Platelets: 65 10*3/uL — ABNORMAL LOW (ref 150–400)

## 2010-12-25 LAB — BASIC METABOLIC PANEL
BUN: 22 mg/dL (ref 6–23)
GFR calc Af Amer: 35 mL/min — ABNORMAL LOW (ref >90)
GFR calc non Af Amer: 30 mL/min — ABNORMAL LOW (ref >90)
Potassium: 4.1 mEq/L (ref 3.5–5.1)

## 2010-12-25 LAB — POCT I-STAT, CHEM 8
BUN: 20 mg/dL (ref 6–23)
Chloride: 105 mEq/L (ref 96–112)
Creatinine, Ser: 1.4 mg/dL — ABNORMAL HIGH (ref 0.50–1.10)
Glucose, Bld: 147 mg/dL — ABNORMAL HIGH (ref 70–99)
HCT: 24 % — ABNORMAL LOW (ref 36.0–46.0)
Potassium: 4 mEq/L (ref 3.5–5.1)

## 2010-12-25 LAB — GLUCOSE, CAPILLARY
Glucose-Capillary: 83 mg/dL (ref 70–99)
Glucose-Capillary: 86 mg/dL (ref 70–99)
Glucose-Capillary: 126 mg/dL — ABNORMAL HIGH (ref 70–99)

## 2010-12-25 LAB — PROTIME-INR: INR: 1.39 (ref 0.00–1.49)

## 2010-12-25 LAB — APTT: aPTT: 41 seconds — ABNORMAL HIGH (ref 24–37)

## 2010-12-25 LAB — URINE CULTURE
Colony Count: >=100000
Culture  Setup Time: 201212131036
Special Requests: NORMAL

## 2010-12-25 LAB — CREATININE, SERUM: GFR calc Af Amer: 47 mL/min — ABNORMAL LOW (ref >90)

## 2010-12-25 LAB — MAGNESIUM: Magnesium: 3 mg/dL — ABNORMAL HIGH (ref 1.5–2.5)

## 2010-12-25 SURGERY — CORONARY ARTERY BYPASS GRAFTING (CABG)
Anesthesia: General | Site: Chest | Wound class: Clean

## 2010-12-25 SURGERY — CORONARY ARTERY BYPASS GRAFTING (CABG)
Anesthesia: General | Site: Chest

## 2010-12-25 MED ORDER — LACTATED RINGERS IV SOLN
INTRAVENOUS | Status: DC | PRN
  Administered 2010-12-25 (×2): via INTRAVENOUS

## 2010-12-25 MED ORDER — ONDANSETRON HCL 4 MG/2ML IJ SOLN
4.0000 mg | Freq: Four times a day (QID) | INTRAMUSCULAR | Status: DC | PRN
  Administered 2010-12-26: 4 mg via INTRAVENOUS
  Filled 2010-12-25: qty 2

## 2010-12-25 MED ORDER — BISACODYL 10 MG RE SUPP: 10.0000 mg | Freq: Every day | RECTAL | Status: DC

## 2010-12-25 MED ORDER — SODIUM CHLORIDE 0.9 % IV SOLN
10.0000 g | INTRAVENOUS | Status: DC | PRN
  Administered 2010-12-25: 5 g/h via INTRAVENOUS

## 2010-12-25 MED ORDER — CALCIUM CHLORIDE 10 % IV SOLN
1.0000 g | Freq: Once | INTRAVENOUS | Status: AC | PRN
  Filled 2010-12-25: qty 10

## 2010-12-25 MED ORDER — OXYCODONE HCL 5 MG PO TABS
5.0000 mg | ORAL_TABLET | ORAL | Status: DC | PRN
  Administered 2010-12-26 – 2010-12-27 (×3): 10 mg via ORAL
  Administered 2010-12-28: 5 mg via ORAL
  Administered 2010-12-29: 10 mg via ORAL
  Filled 2010-12-25: qty 2
  Filled 2010-12-25: qty 1
  Filled 2010-12-25 (×3): qty 2

## 2010-12-25 MED ORDER — LACTATED RINGERS IV SOLN
INTRAVENOUS | Status: DC | PRN
  Administered 2010-12-25 (×2): via INTRAVENOUS

## 2010-12-25 MED ORDER — NITROGLYCERIN IN D5W 200-5 MCG/ML-% IV SOLN
INTRAVENOUS | Status: DC | PRN
  Administered 2010-12-25: 16.6 ug/min via INTRAVENOUS

## 2010-12-25 MED ORDER — SODIUM CHLORIDE 0.9 % IV SOLN: 250.0000 mL | INTRAVENOUS | Status: DC

## 2010-12-25 MED ORDER — MORPHINE SULFATE 4 MG/ML IJ SOLN
2.0000 mg | INTRAMUSCULAR | Status: DC | PRN
  Administered 2010-12-25 – 2010-12-26 (×7): 4 mg via INTRAVENOUS
  Administered 2010-12-27: 2 mg via INTRAVENOUS
  Filled 2010-12-25 (×8): qty 1

## 2010-12-25 MED ORDER — ASPIRIN EC 325 MG PO TBEC
325.0000 mg | DELAYED_RELEASE_TABLET | Freq: Every day | ORAL | Status: DC
  Filled 2010-12-25 (×3): qty 1

## 2010-12-25 MED ORDER — 0.9 % SODIUM CHLORIDE (POUR BTL) OPTIME
TOPICAL | Status: DC | PRN
  Administered 2010-12-25: 6000 mL

## 2010-12-25 MED ORDER — PAPAVERINE HCL 30 MG/ML IJ SOLN
INTRAMUSCULAR | Status: DC | PRN
  Administered 2010-12-25: 60 mg via INTRAVENOUS

## 2010-12-25 MED ORDER — ASPIRIN 81 MG PO CHEW
324.0000 mg | CHEWABLE_TABLET | Freq: Every day | ORAL | Status: DC
  Filled 2010-12-25: qty 1

## 2010-12-25 MED ORDER — FENTANYL CITRATE 0.05 MG/ML IJ SOLN
INTRAMUSCULAR | Status: DC | PRN
  Administered 2010-12-25: 900 ug via INTRAVENOUS
  Administered 2010-12-25: 100 ug via INTRAVENOUS
  Administered 2010-12-25 (×3): 250 ug via INTRAVENOUS

## 2010-12-25 MED ORDER — PHENYLEPHRINE HCL 10 MG/ML IJ SOLN
0.0000 ug/min | INTRAVENOUS | Status: DC
  Filled 2010-12-25: qty 2

## 2010-12-25 MED ORDER — DEXTROSE 5 % IV SOLN
1.5000 g | Freq: Two times a day (BID) | INTRAVENOUS | Status: AC
  Administered 2010-12-25 – 2010-12-26 (×3): 1.5 g via INTRAVENOUS
  Filled 2010-12-25 (×4): qty 1.5

## 2010-12-25 MED ORDER — INSULIN ASPART 100 UNIT/ML ~~LOC~~ SOLN
0.0000 [IU] | Freq: Three times a day (TID) | SUBCUTANEOUS | Status: DC
  Filled 2010-12-25: qty 3

## 2010-12-25 MED ORDER — NITROGLYCERIN IN D5W 200-5 MCG/ML-% IV SOLN
0.0000 ug/min | INTRAVENOUS | Status: DC
  Filled 2010-12-25: qty 250

## 2010-12-25 MED ORDER — INSULIN ASPART 100 UNIT/ML ~~LOC~~ SOLN
0.0000 [IU] | SUBCUTANEOUS | Status: DC
  Administered 2010-12-25 (×3): 2 [IU] via SUBCUTANEOUS
  Filled 2010-12-25 (×2): qty 3

## 2010-12-25 MED ORDER — METOPROLOL TARTRATE 1 MG/ML IV SOLN
2.5000 mg | INTRAVENOUS | Status: DC | PRN
  Administered 2010-12-26: 2.5 mg via INTRAVENOUS
  Filled 2010-12-25: qty 5

## 2010-12-25 MED ORDER — SODIUM CHLORIDE 0.9 % IV SOLN
10.0000 mg | INTRAVENOUS | Status: DC | PRN
  Administered 2010-12-25: 40 ug/min via INTRAVENOUS

## 2010-12-25 MED ORDER — ACETAMINOPHEN 160 MG/5ML PO SOLN: 650.0000 mg | ORAL | Status: AC

## 2010-12-25 MED ORDER — LACTATED RINGERS IV SOLN: INTRAVENOUS | Status: DC

## 2010-12-25 MED ORDER — SODIUM CHLORIDE 0.9 % IV SOLN
INTRAVENOUS | Status: DC
Start: 2010-12-25 — End: 2010-12-28
  Filled 2010-12-25: qty 1

## 2010-12-25 MED ORDER — ACETAMINOPHEN 160 MG/5ML PO SOLN
975.0000 mg | Freq: Four times a day (QID) | ORAL | Status: DC
  Administered 2010-12-26 – 2010-12-27 (×4): 975 mg
  Filled 2010-12-25: qty 40.6
  Filled 2010-12-25: qty 20.3

## 2010-12-25 MED ORDER — SODIUM CHLORIDE 0.9 % IV SOLN
INTRAVENOUS | Status: DC | PRN
  Administered 2010-12-25: 07:00:00 via INTRAVENOUS

## 2010-12-25 MED ORDER — BISACODYL 5 MG PO TBEC
10.0000 mg | DELAYED_RELEASE_TABLET | Freq: Every day | ORAL | Status: DC
  Administered 2010-12-27 – 2010-12-29 (×3): 10 mg via ORAL
  Filled 2010-12-25 (×2): qty 2
  Filled 2010-12-25: qty 1

## 2010-12-25 MED ORDER — PROPOFOL 10 MG/ML IV EMUL
INTRAVENOUS | Status: DC | PRN
  Administered 2010-12-25: 20 mg via INTRAVENOUS

## 2010-12-25 MED ORDER — METOPROLOL TARTRATE 25 MG/10 ML ORAL SUSPENSION
12.5000 mg | Freq: Two times a day (BID) | ORAL | Status: DC
  Administered 2010-12-25: 12.5 mg
  Filled 2010-12-25 (×3): qty 5

## 2010-12-25 MED ORDER — ACETAMINOPHEN 650 MG RE SUPP
650.0000 mg | RECTAL | Status: AC
  Administered 2010-12-25: 650 mg via RECTAL

## 2010-12-25 MED ORDER — HEPARIN SODIUM (PORCINE) 1000 UNIT/ML IJ SOLN
INTRAMUSCULAR | Status: DC | PRN
  Administered 2010-12-25: 2000 [IU] via INTRAVENOUS
  Administered 2010-12-25: 9000 [IU] via INTRAVENOUS

## 2010-12-25 MED ORDER — ALBUMIN HUMAN 5 % IV SOLN
250.0000 mL | INTRAVENOUS | Status: AC | PRN
  Administered 2010-12-25: 250 mL via INTRAVENOUS

## 2010-12-25 MED ORDER — VANCOMYCIN HCL 1000 MG IV SOLR
1000.0000 mg | Freq: Once | INTRAVENOUS | Status: AC
  Administered 2010-12-25: 1000 mg via INTRAVENOUS
  Filled 2010-12-25: qty 1000

## 2010-12-25 MED ORDER — METOPROLOL TARTRATE 12.5 MG HALF TABLET
12.5000 mg | ORAL_TABLET | Freq: Two times a day (BID) | ORAL | Status: DC
  Filled 2010-12-25 (×3): qty 1

## 2010-12-25 MED ORDER — SODIUM CHLORIDE 0.45 % IV SOLN
INTRAVENOUS | Status: DC
  Administered 2010-12-25: 20 mL/h via INTRAVENOUS

## 2010-12-25 MED ORDER — MORPHINE SULFATE 2 MG/ML IJ SOLN: 1.0000 mg | INTRAMUSCULAR | Status: AC | PRN

## 2010-12-25 MED ORDER — HEMOSTATIC AGENTS (NO CHARGE) OPTIME
TOPICAL | Status: DC | PRN
  Administered 2010-12-25: 1 via TOPICAL

## 2010-12-25 MED ORDER — SODIUM CHLORIDE 0.9 % IJ SOLN
3.0000 mL | Freq: Two times a day (BID) | INTRAMUSCULAR | Status: DC
  Administered 2010-12-26 – 2010-12-28 (×3): 3 mL via INTRAVENOUS

## 2010-12-25 MED ORDER — MIDAZOLAM HCL 2 MG/2ML IJ SOLN: 2.0000 mg | INTRAMUSCULAR | Status: DC | PRN

## 2010-12-25 MED ORDER — SODIUM CHLORIDE 0.9 % IV SOLN: INTRAVENOUS | Status: DC

## 2010-12-25 MED ORDER — FAMOTIDINE IN NACL 20-0.9 MG/50ML-% IV SOLN
20.0000 mg | Freq: Two times a day (BID) | INTRAVENOUS | Status: AC
  Administered 2010-12-25: 20 mg via INTRAVENOUS

## 2010-12-25 MED ORDER — NITROPRUSSIDE SODIUM 25 MG/ML IV SOLN
0.2500 ug/kg/min | INTRAVENOUS | Status: AC
  Filled 2010-12-25 (×4): qty 2

## 2010-12-25 MED ORDER — DOCUSATE SODIUM 100 MG PO CAPS
200.0000 mg | ORAL_CAPSULE | Freq: Every day | ORAL | Status: DC
  Administered 2010-12-26 – 2010-12-29 (×5): 200 mg via ORAL
  Filled 2010-12-25: qty 1
  Filled 2010-12-25 (×3): qty 2

## 2010-12-25 MED ORDER — NITROPRUSSIDE SODIUM 25 MG/ML IV SOLN
0.2500 ug/kg/min | INTRAVENOUS | Status: DC
  Administered 2010-12-26: 0.25 ug/kg/min via INTRAVENOUS
  Filled 2010-12-25 (×6): qty 2

## 2010-12-25 MED ORDER — PROTAMINE SULFATE 10 MG/ML IV SOLN
INTRAVENOUS | Status: DC | PRN
  Administered 2010-12-25: 80 mg via INTRAVENOUS

## 2010-12-25 MED ORDER — DEXTROSE 5 % IV SOLN
4.0000 g | Freq: Once | INTRAVENOUS | Status: AC
  Administered 2010-12-25: 4 g via INTRAVENOUS
  Filled 2010-12-25: qty 8

## 2010-12-25 MED ORDER — ROCURONIUM BROMIDE 100 MG/10ML IV SOLN
INTRAVENOUS | Status: DC | PRN
  Administered 2010-12-25 (×2): 50 mg via INTRAVENOUS

## 2010-12-25 MED ORDER — POTASSIUM CHLORIDE 10 MEQ/50ML IV SOLN: 10.0000 meq | INTRAVENOUS | Status: AC

## 2010-12-25 MED ORDER — SODIUM CHLORIDE 0.9 % IJ SOLN: 3.0000 mL | INTRAMUSCULAR | Status: DC | PRN

## 2010-12-25 MED ORDER — PANTOPRAZOLE SODIUM 40 MG PO TBEC
40.0000 mg | DELAYED_RELEASE_TABLET | Freq: Every day | ORAL | Status: DC
  Administered 2010-12-27 – 2010-12-29 (×3): 40 mg via ORAL
  Filled 2010-12-25 (×3): qty 1

## 2010-12-25 MED ORDER — MIDAZOLAM HCL 5 MG/5ML IJ SOLN
INTRAMUSCULAR | Status: DC | PRN
  Administered 2010-12-25 (×2): 3 mg via INTRAVENOUS
  Administered 2010-12-25 (×2): 2 mg via INTRAVENOUS

## 2010-12-25 MED ORDER — DEXMEDETOMIDINE HCL 100 MCG/ML IV SOLN
0.4000 ug/kg/h | INTRAVENOUS | Status: DC
  Filled 2010-12-25: qty 4

## 2010-12-25 MED ORDER — SODIUM CHLORIDE 0.9 % IV SOLN
0.1000 ug/kg/h | INTRAVENOUS | Status: DC
  Filled 2010-12-25: qty 2

## 2010-12-25 MED ORDER — SODIUM CHLORIDE 0.9 % IV SOLN
100.0000 [IU] | INTRAVENOUS | Status: DC | PRN
  Administered 2010-12-25: 1.2 [IU]/h via INTRAVENOUS

## 2010-12-25 MED ORDER — ACETAMINOPHEN 500 MG PO TABS: 1000.0000 mg | ORAL_TABLET | Freq: Four times a day (QID) | ORAL | Status: DC

## 2010-12-25 MED FILL — Potassium Chloride Inj 2 mEq/ML: INTRAVENOUS | Qty: 40 | Status: AC

## 2010-12-25 MED FILL — Magnesium Sulfate Inj 50%: INTRAMUSCULAR | Qty: 10 | Status: AC

## 2010-12-25 SURGICAL SUPPLY — 136 items
ADAPTER CARDIO PERF ANTE/RETRO (ADAPTER) IMPLANT
ADH SKN CLS APL DERMABOND .7 (GAUZE/BANDAGES/DRESSINGS) ×1
ADPR PRFSN 84XANTGRD RTRGD (ADAPTER)
APL SKNCLS STERI-STRIP NONHPOA (GAUZE/BANDAGES/DRESSINGS)
APPLIER CLIP 9.375 MED OPEN (MISCELLANEOUS)
APPLIER CLIP 9.375 SM OPEN (CLIP) ×2
APR CLP MED 9.3 20 MLT OPN (MISCELLANEOUS)
APR CLP SM 9.3 20 MLT OPN (CLIP) ×1
ATTRACTOMAT 16X20 MAGNETIC DRP (DRAPES) ×2 IMPLANT
BAG DECANTER FOR FLEXI CONT (MISCELLANEOUS) ×2 IMPLANT
BANDAGE ELASTIC 4 VELCRO ST LF (GAUZE/BANDAGES/DRESSINGS) ×2 IMPLANT
BANDAGE ELASTIC 6 VELCRO ST LF (GAUZE/BANDAGES/DRESSINGS) ×2 IMPLANT
BANDAGE GAUZE ELAST BULKY 4 IN (GAUZE/BANDAGES/DRESSINGS) ×2 IMPLANT
BASKET HEART (ORDER IN 25'S) (MISCELLANEOUS) ×1
BASKET HEART (ORDER IN 25S) (MISCELLANEOUS) ×1 IMPLANT
BENZOIN TINCTURE PRP APPL 2/3 (GAUZE/BANDAGES/DRESSINGS) ×1 IMPLANT
BLADE STERNUM SYSTEM 6 (BLADE) ×2 IMPLANT
BLADE SURG ROTATE 9660 (MISCELLANEOUS) IMPLANT
CANISTER SUCTION 2500CC (MISCELLANEOUS) ×2 IMPLANT
CANNULA GUNDRY RCSP 15FR (MISCELLANEOUS) IMPLANT
CATH CPB KIT OWEN (MISCELLANEOUS) ×2 IMPLANT
CATH HEART VENT LEFT (CATHETERS) IMPLANT
CATH ROBINSON RED A/P 18FR (CATHETERS) ×4 IMPLANT
CATH THORACIC 28FR (CATHETERS) IMPLANT
CATH THORACIC 28FR RT ANG (CATHETERS) IMPLANT
CATH THORACIC 36FR (CATHETERS) ×2 IMPLANT
CATH THORACIC 36FR RT ANG (CATHETERS) ×2 IMPLANT
CLIP APPLIE 9.375 MED OPEN (MISCELLANEOUS) IMPLANT
CLIP APPLIE 9.375 SM OPEN (CLIP) IMPLANT
CLIP FOGARTY SPRING 6M (CLIP) IMPLANT
CLIP RETRACTION 3.0MM CORONARY (MISCELLANEOUS) ×1 IMPLANT
CLIP TI MEDIUM 24 (CLIP) IMPLANT
CLIP TI MEDIUM 6 (CLIP) IMPLANT
CLIP TI WIDE RED SMALL 24 (CLIP) IMPLANT
CLIP TI WIDE RED SMALL 6 (CLIP) IMPLANT
CLOTH BEACON ORANGE TIMEOUT ST (SAFETY) ×2 IMPLANT
CONN Y 3/8X3/8X3/8  BEN (MISCELLANEOUS)
CONN Y 3/8X3/8X3/8 BEN (MISCELLANEOUS) IMPLANT
COVER SURGICAL LIGHT HANDLE (MISCELLANEOUS) ×4 IMPLANT
CRADLE DONUT ADULT HEAD (MISCELLANEOUS) ×2 IMPLANT
DERMABOND ADVANCED (GAUZE/BANDAGES/DRESSINGS) ×1
DERMABOND ADVANCED .7 DNX12 (GAUZE/BANDAGES/DRESSINGS) IMPLANT
DRAIN CHANNEL 32F RND 10.7 FF (WOUND CARE) ×2 IMPLANT
DRAPE CARDIOVASCULAR INCISE (DRAPES) ×2
DRAPE SLUSH/WARMER DISC (DRAPES) ×1 IMPLANT
DRAPE SRG 135X102X78XABS (DRAPES) ×1 IMPLANT
DRSG COVADERM 4X14 (GAUZE/BANDAGES/DRESSINGS) ×2 IMPLANT
ELECT REM PT RETURN 9FT ADLT (ELECTROSURGICAL) ×4
ELECTRODE REM PT RTRN 9FT ADLT (ELECTROSURGICAL) ×2 IMPLANT
GLOVE BIO SURGEON STRL SZ 6 (GLOVE) ×3 IMPLANT
GLOVE BIO SURGEON STRL SZ 6.5 (GLOVE) IMPLANT
GLOVE BIO SURGEON STRL SZ7 (GLOVE) IMPLANT
GLOVE BIO SURGEON STRL SZ7.5 (GLOVE) ×1 IMPLANT
GLOVE BIOGEL PI IND STRL 6 (GLOVE) IMPLANT
GLOVE BIOGEL PI IND STRL 6.5 (GLOVE) IMPLANT
GLOVE BIOGEL PI IND STRL 7.0 (GLOVE) IMPLANT
GLOVE BIOGEL PI INDICATOR 6 (GLOVE) ×1
GLOVE BIOGEL PI INDICATOR 6.5 (GLOVE)
GLOVE BIOGEL PI INDICATOR 7.0 (GLOVE) ×8
GLOVE EUDERMIC 7 POWDERFREE (GLOVE) IMPLANT
GLOVE ORTHO TXT STRL SZ7.5 (GLOVE) ×4 IMPLANT
GOWN PREVENTION PLUS XLARGE (GOWN DISPOSABLE) ×3 IMPLANT
GOWN STRL NON-REIN LRG LVL3 (GOWN DISPOSABLE) ×12 IMPLANT
HEMOSTAT POWDER SURGIFOAM 1G (HEMOSTASIS) ×6 IMPLANT
INSERT FOGARTY 61MM (MISCELLANEOUS) IMPLANT
INSERT FOGARTY XLG (MISCELLANEOUS) ×4 IMPLANT
KIT BASIN OR (CUSTOM PROCEDURE TRAY) ×2 IMPLANT
KIT PAIN CUSTOM (MISCELLANEOUS) IMPLANT
KIT ROOM TURNOVER OR (KITS) ×2 IMPLANT
KIT SUCTION CATH 14FR (SUCTIONS) ×2 IMPLANT
KIT VASOVIEW W/TROCAR VH 2000 (KITS) ×2 IMPLANT
LEAD PACING MYOCARDI (MISCELLANEOUS) ×2 IMPLANT
MARKER GRAFT CORONARY BYPASS (MISCELLANEOUS) ×6 IMPLANT
NS IRRIG 1000ML POUR BTL (IV SOLUTION) ×11 IMPLANT
PACK OPEN HEART (CUSTOM PROCEDURE TRAY) ×2 IMPLANT
PAD ARMBOARD 7.5X6 YLW CONV (MISCELLANEOUS) ×4 IMPLANT
PENCIL BUTTON HOLSTER BLD 10FT (ELECTRODE) ×2 IMPLANT
PUNCH AORTIC ROTATE 4.0MM (MISCELLANEOUS) ×1 IMPLANT
PUNCH AORTIC ROTATE 4.5MM 8IN (MISCELLANEOUS) IMPLANT
PUNCH AORTIC ROTATE 5MM 8IN (MISCELLANEOUS) IMPLANT
SET CARDIOPLEGIA MPS 5001102 (MISCELLANEOUS) ×1 IMPLANT
SOLUTION ANTI FOG 6CC (MISCELLANEOUS) IMPLANT
SPONGE GAUZE 4X4 12PLY (GAUZE/BANDAGES/DRESSINGS) ×5 IMPLANT
SPONGE INTESTINAL PEANUT (DISPOSABLE) IMPLANT
SPONGE LAP 18X18 X RAY DECT (DISPOSABLE) ×2 IMPLANT
SPONGE LAP 4X18 X RAY DECT (DISPOSABLE) ×1 IMPLANT
STOPCOCK 4 WAY LG BORE MALE ST (IV SETS) IMPLANT
STRIP CLOSURE SKIN 1/2X4 (GAUZE/BANDAGES/DRESSINGS) ×1 IMPLANT
SUT BONE WAX W31G (SUTURE) ×4 IMPLANT
SUT ETHIBOND X763 2 0 SH 1 (SUTURE) ×6 IMPLANT
SUT MNCRL AB 3-0 PS2 18 (SUTURE) ×4 IMPLANT
SUT MNCRL AB 4-0 PS2 18 (SUTURE) IMPLANT
SUT PDS AB 1 CTX 36 (SUTURE) ×4 IMPLANT
SUT PROLENE 2 0 SH DA (SUTURE) IMPLANT
SUT PROLENE 3 0 SH DA (SUTURE) ×2 IMPLANT
SUT PROLENE 3 0 SH1 36 (SUTURE) IMPLANT
SUT PROLENE 4 0 RB 1 (SUTURE)
SUT PROLENE 4 0 SH DA (SUTURE) IMPLANT
SUT PROLENE 4-0 RB1 .5 CRCL 36 (SUTURE) IMPLANT
SUT PROLENE 5 0 C 1 36 (SUTURE) IMPLANT
SUT PROLENE 6 0 C 1 30 (SUTURE) ×4 IMPLANT
SUT PROLENE 6 0 CC (SUTURE) IMPLANT
SUT PROLENE 7 0 BV 1 (SUTURE) IMPLANT
SUT PROLENE 7 0 BV1 MDA (SUTURE) IMPLANT
SUT PROLENE 7.0 RB 3 (SUTURE) ×18 IMPLANT
SUT PROLENE 8 0 BV175 6 (SUTURE) ×1 IMPLANT
SUT PROLENE BLUE 7 0 (SUTURE) ×2 IMPLANT
SUT PROLENE POLY MONO (SUTURE) ×2 IMPLANT
SUT SILK  1 MH (SUTURE) ×1
SUT SILK 1 MH (SUTURE) ×1 IMPLANT
SUT SILK 2 0 SH CR/8 (SUTURE) IMPLANT
SUT SILK 3 0 SH CR/8 (SUTURE) IMPLANT
SUT STEEL 6MS V (SUTURE) ×1 IMPLANT
SUT STEEL STERNAL CCS#1 18IN (SUTURE) ×1 IMPLANT
SUT STEEL SZ 6 DBL 3X14 BALL (SUTURE) ×3 IMPLANT
SUT VIC AB 1 CTX 36 (SUTURE)
SUT VIC AB 1 CTX36XBRD ANBCTR (SUTURE) IMPLANT
SUT VIC AB 2-0 CT1 27 (SUTURE) ×2
SUT VIC AB 2-0 CT1 TAPERPNT 27 (SUTURE) IMPLANT
SUT VIC AB 2-0 CTX 27 (SUTURE) IMPLANT
SUT VIC AB 3-0 SH 27 (SUTURE)
SUT VIC AB 3-0 SH 27X BRD (SUTURE) IMPLANT
SUT VIC AB 3-0 X1 27 (SUTURE) ×1 IMPLANT
SUT VICRYL 4-0 PS2 18IN ABS (SUTURE) IMPLANT
SUTURE E-PAK OPEN HEART (SUTURE) ×2 IMPLANT
SYSTEM SAHARA CHEST DRAIN ATS (WOUND CARE) ×2 IMPLANT
TAPE CLOTH SURG 4X10 WHT LF (GAUZE/BANDAGES/DRESSINGS) ×2 IMPLANT
TOWEL OR 17X24 6PK STRL BLUE (TOWEL DISPOSABLE) ×2 IMPLANT
TOWEL OR 17X26 10 PK STRL BLUE (TOWEL DISPOSABLE) ×2 IMPLANT
TRAY FOLEY IC TEMP SENS 14FR (CATHETERS) ×2 IMPLANT
TUBE CONNECTING 12X1/4 (SUCTIONS) ×1 IMPLANT
TUBE SUCT INTRACARD DLP 20F (MISCELLANEOUS) ×2 IMPLANT
TUBING INSUFFLATION 10FT LAP (TUBING) ×3 IMPLANT
UNDERPAD 30X30 INCONTINENT (UNDERPADS AND DIAPERS) ×2 IMPLANT
VENT LEFT HEART 12002 (CATHETERS)
WATER STERILE IRR 1000ML POUR (IV SOLUTION) ×4 IMPLANT

## 2010-12-25 NOTE — Anesthesia Procedure Notes (Addendum)
Procedure Name: Intubation Date/Time: 12/25/2010 7:55 AM Performed by: Koren Bound, TOM Pre-anesthesia Checklist: Patient identified, Emergency Drugs available, Suction available, Patient being monitored and Timeout performed Patient Re-evaluated:Patient Re-evaluated prior to inductionOxygen Delivery Method: Circle System Utilized Preoxygenation: Pre-oxygenation with 100% oxygen Intubation Type: IV induction Ventilation: Mask ventilation without difficulty and Oral airway inserted - appropriate to patient size Laryngoscope Size: Mac and 4 Grade View: Grade IV Tube size: 8.0 mm Number of attempts: 2 Airway Equipment and Method: video-laryngoscopy Placement Confirmation: ETT inserted through vocal cords under direct vision,  positive ETCO2 and breath sounds checked- equal and bilateral Secured at: 20 cm Tube secured with: Tape Dental Injury: Teeth and Oropharynx as per pre-operative assessment     Date/Time: 12/25/2010 7:55 AM Performed by: Koren Bound, TOM

## 2010-12-25 NOTE — Progress Notes (Signed)
Patient ID: Dana Jackson, female   DOB: 1945-02-21, 65 y.o.   MRN: [id number]  Filed Vitals:   12/25/10 1630 12/25/10 1645 12/25/10 1700 12/25/10 1710  BP: 79/47 87/51 85/49  118/59  Pulse: 81 81 81 80  Temp: 98.4 F (36.9 C) 98.6 F (37 C) 98.6 F (37 C)   TempSrc:   Core (Comment)   Resp: 13 13 16 15   Height:      Weight:      SpO2: 100% 100% 100% 100%   CI=2.0 on no drips  Still intubated but close to extubation  CToutput low, UO ok  BMET    Component Value Date/Time   NA 137 12/25/2010 1246   K 4.0 12/25/2010 1246   CL 102 12/25/2010 0403   CO2 25 12/25/2010 0403   GLUCOSE 154* 12/25/2010 1246   BUN 22 12/25/2010 0403   CREATININE 1.69* 12/25/2010 0403   CALCIUM 9.7 12/25/2010 0403   GFRNONAA 30* 12/25/2010 0403   GFRAA 35* 12/25/2010 0403    CBC    Component Value Date/Time   WBC 5.5 12/25/2010 1248   RBC 2.52* 12/25/2010 1248   HGB 7.9* 12/25/2010 1248   HCT 22.5* 12/25/2010 1248   PLT 43* 12/25/2010 1248   MCV 89.3 12/25/2010 1248   MCH 31.3 12/25/2010 1248   MCHC 35.1 12/25/2010 1248   RDW 12.8 12/25/2010 1248   LYMPHSABS 0.8 12/04/2008 1809   MONOABS 0.5 12/04/2008 1809   EOSABS 0.1 12/04/2008 1809   BASOSABS 0.0 12/04/2008 1809    A/P:  Stable.  Extubate when ready.  Follow up on HCT  At 6 hr.

## 2010-12-25 NOTE — Anesthesia Preprocedure Evaluation (Addendum)
Anesthesia Evaluation  Patient identified by MRN, date of birth, ID band Patient awake    Reviewed: Allergy & Precautions, H&P , NPO status , Patient's Chart, lab work & pertinent test results  Airway Mallampati: III TM Distance: >3 FB Neck ROM: Full  Mouth opening: Limited Mouth Opening  Dental No notable dental hx. (+) Teeth Intact and Dental Advisory Given   Pulmonary  clear to auscultation  Pulmonary exam normal       Cardiovascular hypertension, Pt. on medications + angina (chest pain yesterday) + CAD (12/12 cath:  normal LVF, 50% LM, 80% LAD, 95% Cx, 90% PDA) Regular Normal+ Systolic murmurs (1/6 SEM)    Neuro/Psych TIA   GI/Hepatic Neg liver ROS, GERD-  Medicated and Controlled,  Endo/Other    Renal/GU Renal InsufficiencyRenal disease (creat 1.69)     Musculoskeletal   Abdominal (+) obese,   Peds  Hematology thrombocytopenic   Anesthesia Other Findings   Reproductive/Obstetrics                          Anesthesia Physical Anesthesia Plan  ASA: III  Anesthesia Plan: General   Post-op Pain Management:    Induction: Intravenous  Airway Management Planned: Oral ETT  Additional Equipment: Arterial line and PA Cath  Intra-op Plan:   Post-operative Plan: Post-operative intubation/ventilation  Informed Consent: I have reviewed the patients History and Physical, chart, labs and discussed the procedure including the risks, benefits and alternatives for the proposed anesthesia with the patient or authorized representative who has indicated his/her understanding and acceptance.   Dental advisory given  Plan Discussed with: CRNA, Surgeon and Anesthesiologist  Anesthesia Plan Comments: (Plan routine monitors, A line, PA cath, GETA with post op ventilation)       Anesthesia Quick Evaluation

## 2010-12-25 NOTE — Progress Notes (Signed)
Pt transferred to OR for CABG.  VSS.  Caryl Pina, RN transferred with pt.  Husband [name]) took pts belongings to their car.

## 2010-12-25 NOTE — Procedures (Signed)
Extubation Procedure Note  Patient Details:   Name: Dana Jackson DOB: 1945-03-19 MRN: [id number]                     Pt awake and alert.Pt extubated per protocol, placed on 3L [place], sat 97%. Positive cuff leak, NIF -20, VC 700, HR 90 RR 19. BBs rhonchus. Pt able to vocalize. Evaluation  O2 sats: stable throughout and currently acceptable Complications: No apparent complications Patient did tolerate procedure well. Bilateral Breath Sounds: Clear   Yes Vallery Sa 12/25/2010, 5:59 PM

## 2010-12-25 NOTE — Anesthesia Postprocedure Evaluation (Signed)
  Anesthesia Post-op Note  Patient: Dana Jackson  Procedure(s) Performed:  CORONARY ARTERY BYPASS GRAFTING (CABG)  Patient Location: SICU  Anesthesia Type: General  Level of Consciousness: sedated  Airway and Oxygen Therapy: Patient remains intubated per anesthesia plan and Patient placed on Ventilator (see vital sign flow sheet for setting)  Post-op Pain: none  Post-op Assessment: Post-op Vital signs reviewed, Patient's Cardiovascular Status Stable, Respiratory Function Stable and Pain level controlled, remains on ventilator  Post-op Vital Signs: Reviewed and stable  Complications: No apparent anesthesia complications

## 2010-12-25 NOTE — Transfer of Care (Signed)
Immediate Anesthesia Transfer of Care Note  Patient: Dana Jackson  Procedure(s) Performed:  CORONARY ARTERY BYPASS GRAFTING (CABG)  Patient Location: SICU  Anesthesia Type: General  Level of Consciousness: unresponsive  Airway & Oxygen Therapy: Patient remains intubated per anesthesia plan and Patient placed on Ventilator (see vital sign flow sheet for setting)  Post-op Assessment: Report given to PACU RN and Post -op Vital signs reviewed and stable  Post vital signs: Reviewed and stable  Complications: No apparent anesthesia complications

## 2010-12-25 NOTE — Preoperative (Signed)
Beta Blockers   Reason not to administer Beta Blockers:Not Applicable 

## 2010-12-25 NOTE — Brief Op Note (Addendum)
12/23/2010 - 12/25/2010                   [address]            [place] [id number]          [phone]    12/23/2010 - 12/25/2010  10:40 AM  PATIENT:  Dana Jackson  65 y.o. female  PRE-OPERATIVE DIAGNOSIS:  CAD  POST-OPERATIVE DIAGNOSIS:  CAD  PROCEDURE:  Procedure(s): CORONARY ARTERY BYPASS GRAFTING (CABG)X3 (LIMA-LAD; SVG- OM; SVG- PL) EVH Right Leg  SURGEON:  Surgeon(s): Rexene Alberts, MD  PHYSICIAN ASSISTANT: Jadene Pierini PA-C  ANESTHESIA:   general  PATIENT CONDITION:  ICU - intubated and hemodynamically stable.  PRE-OPERATIVE WEIGHT: [id number]  COMPLICATIONS: NO KNOWN   WAYNE GOLD PA-C  [name],[name] 12/25/2010 12:06 PM

## 2010-12-25 NOTE — Op Note (Signed)
CARDIOTHORACIC SURGERY OPERATIVE NOTE  Date of Procedure: 12/25/2010  Preoperative Diagnosis:   Severe 3-vessel Coronary Artery Disease  Postoperative Diagnosis:   Same  Procedure:    Coronary Artery Bypass Grafting x 3   Left Internal Mammary Artery to Distal Left Anterior Descending Coronary Artery  Saphenous Vein Graft to Right Posterolateral Branch Coronary Artery  Saphenous Vein Graft to  Obtuse Marginal Branch of Left Circumflex Coronary Artery  Endoscopic Vein Harvest from Right Thigh and Lower Leg  Surgeon: Valentina Gu. 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 The patient presents with recent onset symptoms of exertional chest discomfort and shortness of breath consistent with angina pectoris. She was referred to Dr. Ellyn Hack who scheduled her for elective cardiac catheterization. Cardiac catheterization demonstrates severe three-vessel coronary artery disease with normal left ventricular function. Cardiothoracic surgical consultation has been requested to consider elective surgical revascularization.    DETAILS OF THE OPERATIVE PROCEDURE  The patient is brought to the operating room on the above mentioned date and central monitoring was established by the anesthesia team including placement of Swan-Ganz catheter and radial arterial line. The  patient is placed in the supine position on the operating table.  Intravenous antibiotics are administered. General endotracheal anesthesia is induced uneventfully. A Foley catheter is placed.  Baseline transesophageal echocardiogram was performed.  Findings were notable for normal LV function  The patient's chest, abdomen, both groins, and both lower extremities are prepared and draped in a sterile manner. A time out procedure is performed.  A median sternotomy incision was performed and the left internal mammary artery is dissected from the chest wall and prepared for bypass grafting. The left internal mammary artery is notably good quality conduit. Simultaneously, saphenous vein is obtained from the patient's right using endoscopic vein harvest technique. The saphenous vein is notably fair quality conduit. After removal of the saphenous vein, the small surgical incisions in the lower extremity are closed with absorbable suture. Following systemic heparinization, the left internal mammary artery was transected distally noted to have excellent flow.  The pericardium is opened. The ascending aorta is normal in appearance. The ascending aorta and the right atrium are cannulated for cardioplegia bypass.  Adequate heparinization is verified.   The entire pre-bypass portion of the operation was notable for stable hemodynamics except for brief episode of atrial fibrillation.  Cardiopulmonary bypass was begun and the surface of the heart is inspected. Distal target vessels are selected for coronary artery bypass grafting. A cardioplegia cannula is placed in the ascending aorta.  A temperature probe was placed in the interventricular septum.  The patient is cooled to 32C systemic temperature.  The aortic cross clamp is applied and cold blood cardioplegia is delivered initially in an antegrade fashion through the aortic root.   Iced saline slush is applied for topical hypothermia.  The initial cardioplegic  arrest is rapid with early diastolic arrest.  Repeat doses of cardioplegia are administered intermittently throughout the entire cross clamp  portion of the operation through the aortic root and through subsequently placed vein grafts in order to maintain completely flat electrocardiogram and septal myocardial temperature below 15C.  Myocardial protection was felt to be  excellent.  The following distal coronary artery bypass grafts were performed:   The posterolateral branch of the distal right coronary artery was grafted using a reversed saphenous vein graft in an end-to-side fashion.  At the site of distal anastomosis the target vessel was fair to good quality and measured approximately 1.5 mm in diameter.  The posterior descending branch was too small and diffusely diseased for grafting.  The obtuse marginal branch of the left circumflex coronary artery was grafted using a reversed saphenous vein graft in an end-to-side fashion.  At the site of distal anastomosis the target vessel was diffusely diseased and only fair quality with diffuse plaque making choice for anastomosis site difficult.  It measured approximately 1.8 mm in diameter.  The distal left anterior coronary artery was grafted with the left internal mammary artery in an end-to-side fashion.  At the site of distal anastomosis the target vessel was fair quality and measured approximately 1.5 mm in diameter.  This vessel was also noted to have hard plaque throughout, making the choice of site for distal anastomosis difficult.   All proximal vein graft anastomoses were placed directly to the ascending aorta prior to removal of the aortic cross clamp.  The septal myocardial temperature rose rapidly after reperfusion of the left internal mammary artery graft.  The aortic cross clamp was removed after a total cross clamp time of 59 minutes.  All proximal and distal coronary anastomoses were inspected for hemostasis and appropriate graft  orientation. Epicardial pacing wires are fixed to the right ventricular outflow tract and to the right atrial appendage. The patient is rewarmed to 37C temperature. The patient is weaned and disconnected from cardiopulmonary bypass.  The patient's rhythm at separation from bypass was sinus.  The patient was weaned from cardioplegic bypass  without any inotropic support. Total cardiopulmonary bypass time for the operation was 85 minutes.  Followup transesophageal echocardiogram performed after separation from bypass revealed  no changes from the preoperative exam.  The aortic and venous cannula were removed uneventfully. Protamine was administered to reverse the anticoagulation. The mediastinum and pleural space were inspected for hemostasis and irrigated with saline solution. The mediastinum and the left pleural space were drained using 3 chest tubes placed through separate stab incisions inferiorly.  The soft tissues anterior to the aorta were reapproximated loosely. The sternum is closed with double strength sternal wire. The soft tissues anterior to the sternum were closed in multiple layers and the skin is closed with a running subcuticular skin closure.   The post-bypass portion of the operation was notable for stable rhythm and hemodynamics other than a tendency for hypertension.   No blood products were administered during the operation.  The patient tolerated the procedure well and is transported to the surgical intensive care in stable condition. There are no intraoperative complications. All sponge instrument and needle counts are verified correct at completion of the operation.    Valentina Gu. Roxy Manns MD

## 2010-12-26 ENCOUNTER — Inpatient Hospital Stay (HOSPITAL_COMMUNITY): Payer: Medicare Other

## 2010-12-26 LAB — CBC
HCT: 24 % — ABNORMAL LOW (ref 36.0–46.0)
HCT: 25.5 % — ABNORMAL LOW (ref 36.0–46.0)
Hemoglobin: 8.4 g/dL — ABNORMAL LOW (ref 12.0–15.0)
Hemoglobin: 8.8 g/dL — ABNORMAL LOW (ref 12.0–15.0)
MCH: 31.1 pg (ref 26.0–34.0)
MCH: 31 pg (ref 26.0–34.0)
MCHC: 35 g/dL (ref 30.0–36.0)
MCHC: 34.5 g/dL (ref 30.0–36.0)
MCV: 88.9 fL (ref 78.0–100.0)
RBC: 2.7 MIL/uL — ABNORMAL LOW (ref 3.87–5.11)

## 2010-12-26 LAB — PREPARE FRESH FROZEN PLASMA: Unit division: 0

## 2010-12-26 LAB — CREATININE, SERUM: Creatinine, Ser: 1.65 mg/dL — ABNORMAL HIGH (ref 0.50–1.10)

## 2010-12-26 LAB — POCT I-STAT, CHEM 8
BUN: 20 mg/dL (ref 6–23)
Chloride: 100 mEq/L (ref 96–112)
Potassium: 4.1 mEq/L (ref 3.5–5.1)
Sodium: 136 mEq/L (ref 135–145)

## 2010-12-26 LAB — BASIC METABOLIC PANEL
BUN: 19 mg/dL (ref 6–23)
CO2: 21 mEq/L (ref 19–32)
Calcium: 8.7 mg/dL (ref 8.4–10.5)
GFR calc non Af Amer: 34 mL/min — ABNORMAL LOW (ref >90)
Glucose, Bld: 111 mg/dL — ABNORMAL HIGH (ref 70–99)

## 2010-12-26 LAB — PREPARE PLATELET PHERESIS: Unit division: 0

## 2010-12-26 LAB — GLUCOSE, CAPILLARY
Glucose-Capillary: 111 mg/dL — ABNORMAL HIGH (ref 70–99)
Glucose-Capillary: 119 mg/dL — ABNORMAL HIGH (ref 70–99)

## 2010-12-26 MED ORDER — METOPROLOL TARTRATE 25 MG/10 ML ORAL SUSPENSION
25.0000 mg | Freq: Two times a day (BID) | ORAL | Status: DC
  Filled 2010-12-26 (×6): qty 10

## 2010-12-26 MED ORDER — FUROSEMIDE 10 MG/ML IJ SOLN
40.0000 mg | Freq: Two times a day (BID) | INTRAMUSCULAR | Status: AC
  Administered 2010-12-26 (×2): 40 mg via INTRAVENOUS
  Filled 2010-12-26 (×2): qty 4

## 2010-12-26 MED ORDER — POTASSIUM CHLORIDE CRYS ER 20 MEQ PO TBCR
20.0000 meq | EXTENDED_RELEASE_TABLET | Freq: Two times a day (BID) | ORAL | Status: AC
  Administered 2010-12-26 (×2): 20 meq via ORAL
  Filled 2010-12-26: qty 1
  Filled 2010-12-26: qty 2

## 2010-12-26 MED ORDER — METOPROLOL TARTRATE 25 MG PO TABS
25.0000 mg | ORAL_TABLET | Freq: Two times a day (BID) | ORAL | Status: DC
  Administered 2010-12-26 – 2010-12-29 (×4): 25 mg via ORAL
  Filled 2010-12-26 (×8): qty 1

## 2010-12-26 MED ORDER — INSULIN ASPART 100 UNIT/ML ~~LOC~~ SOLN
0.0000 [IU] | SUBCUTANEOUS | Status: DC
  Administered 2010-12-26: 2 [IU] via SUBCUTANEOUS
  Administered 2010-12-26: 4 [IU] via SUBCUTANEOUS
  Administered 2010-12-27 – 2010-12-28 (×5): 2 [IU] via SUBCUTANEOUS

## 2010-12-26 NOTE — Progress Notes (Signed)
Patient ID: Dana Jackson, female   DOB: 1946/01/02, 65 y.o.   MRN: [id number]  Filed Vitals:   12/26/10 1500 12/26/10 1600 12/26/10 1700 12/26/10 1800  BP: 119/61 116/56 122/68 96/35  Pulse: 88 91 90 90  Temp:      TempSrc:      Resp: 11 10 17 17   Height:      Weight:      SpO2: 97% 93% 99% 92%   Had a good day.  Ambulated Urine output ok  BMET    Component Value Date/Time   NA 136 12/26/2010 1653   K 4.1 12/26/2010 1653   CL 100 12/26/2010 1653   CO2 21 12/26/2010 0400   GLUCOSE 145* 12/26/2010 1653   BUN 20 12/26/2010 1653   CREATININE 1.65* 12/26/2010 1656   CALCIUM 8.7 12/26/2010 0400   GFRNONAA 31* 12/26/2010 1656   GFRAA 36* 12/26/2010 1656    CBC    Component Value Date/Time   WBC 10.4 12/26/2010 1656   RBC 2.84* 12/26/2010 1656   HGB 8.8* 12/26/2010 1656   HCT 25.5* 12/26/2010 1656   PLT 78* 12/26/2010 1656   MCV 89.8 12/26/2010 1656   MCH 31.0 12/26/2010 1656   MCHC 34.5 12/26/2010 1656   RDW 13.2 12/26/2010 1656   LYMPHSABS 0.8 12/04/2008 1809   MONOABS 0.5 12/04/2008 1809   EOSABS 0.1 12/04/2008 1809   BASOSABS 0.0 12/04/2008 1809    A/P:  Stable.  Continue present course.

## 2010-12-26 NOTE — Progress Notes (Signed)
1 Day Post-Op Procedure(s) (LRB): CORONARY ARTERY BYPASS GRAFTING (CABG) (N/A) Subjective: No specific complaints  Objective: Vital signs in last 24 hours: Temp:  [96.8 F (36 C)-99.3 F (37.4 C)] 98.8 F (37.1 C) (12/15 0715) Pulse Rate:  [80-95] 87  (12/15 0800) Cardiac Rhythm:  [-] Normal sinus rhythm (12/15 0800) Resp:  [11-20] 12  (12/15 0800) BP: (79-140)/(47-73) 103/64 mmHg (12/15 0800) SpO2:  [91 %-100 %] 94 % (12/15 0800) Arterial Line BP: (98-143)/(48-63) 124/50 mmHg (12/15 0800) FiO2 (%):  [39.9 %-51 %] 40.1 % (12/14 1730) Weight:  [64.2 kg (141 lb 8.6 oz)-72.4 kg (159 lb 9.8 oz)] 159 lb 9.8 oz (72.4 kg) (12/15 0600)  Hemodynamic parameters for last 24 hours: PAP: (17-29)/(6-18) 21/7 mmHg CO:  [3.1 L/min-4.4 L/min] 3.9 L/min CI:  [1.9 L/min/m2-2.7 L/min/m2] 2.4 L/min/m2  Intake/Output from previous day: 12/14 0701 - 12/15 0700 In: 5965.8 [I.V.:4037.8; IN:573108; NG/GT:30; IV Piggyback:450] Out: [id number] [Urine:1725; Emesis/NG output:250; Blood:800; Chest Tube:450] Intake/Output this shift: Total I/O In: 40 [I.V.:40] Out: -   General appearance: alert and cooperative Neurologic: intact Heart: regular rate and rhythm, S1, S2 normal, no murmur, click,  or gallop.  Rub from tubes Lungs: clear to auscultation bilaterally Extremities: edema moderate Wound: dressing dry  Lab Results:  Basename 12/26/10 0400 12/25/10 1847 12/25/10 1845  WBC 9.6 -- 7.8  HGB 8.4* 8.2* --  HCT 24.0* 24.0* --  PLT 59* -- 48*   BMET:  Basename 12/26/10 0400 12/25/10 1847 12/25/10 0403  NA 138 137 --  Jackson 4.0 4.0 --  CL 105 105 --  CO2 21 -- 25  GLUCOSE 111* 147* --  BUN 19 20 --  CREATININE 1.53* 1.40* --  CALCIUM 8.7 -- 9.7    PT/INR:  Basename 12/25/10 1248  LABPROT 17.3*  INR 1.39   ABG    Component Value Date/Time   PHART 7.326* 12/25/2010 1843   HCO3 21.2 12/25/2010 1843   TCO2 19 12/25/2010 1847   ACIDBASEDEF 4.0* 12/25/2010 1843   O2SAT 92.0 12/25/2010 1843    CBG (last 3)   Basename 12/26/10 0815 12/26/10 0019 12/25/10 2207  GLUCAP 119* 114* 126*   chest X-ray:  Left basilar atelectasis  Assessment/Plan: S/P Procedure(s) (LRB): CORONARY ARTERY BYPASS GRAFTING (CABG) (N/A) Mobilize Diuresis d/c tubes/lines See progression orders Hx.of chronic kidney disease s/p partial nephrectomy.  Watch creatinine. Postop thrombocytopenia:  Platelet count is increasing. Acute blood loss anemia:  Observe and start Fe2+ when taking po's.  LOS: 3 days    Dana Jackson,Dana Jackson 12/26/2010

## 2010-12-26 NOTE — Progress Notes (Signed)
Subjective:  POD #1 CABG X3. Still week with chest wall pain.  Objective:  Temp:  [96.8 F (36 C)-99.3 F (37.4 C)] 98.8 F (37.1 C) (12/15 0715) Pulse Rate:  [80-95] 87  (12/15 0800) Resp:  [11-20] 12  (12/15 0800) BP: (79-140)/(47-73) 103/64 mmHg (12/15 0800) SpO2:  [91 %-100 %] 94 % (12/15 0800) Arterial Line BP: (98-143)/(48-63) 124/50 mmHg (12/15 0800) FiO2 (%):  [39.9 %-51 %] 40.1 % (12/14 1730) Weight:  [64.2 kg (141 lb 8.6 oz)-72.4 kg (159 lb 9.8 oz)] 159 lb 9.8 oz (72.4 kg) (12/15 0600) Weight change: 0 kg (0 lb)  Intake/Output from previous day: 12/14 0701 - 12/15 0700 In: 5965.8 [I.V.:4037.8; HI:560558; NG/GT:30; IV Piggyback:450] Out: [id number] [Urine:1725; Emesis/NG output:250; Blood:800; Chest Tube:450]  Intake/Output from this shift: Total I/O In: 80 [I.V.:80] Out: -   Physical Exam: General appearance: alert, cooperative and no distress Lungs: clear to auscultation bilaterally Heart: regular rate and rhythm, S1, S2 normal, no murmur, click, rub or gallop Extremities: edema 1+ RLE  Lab Results: Results for orders placed during the hospital encounter of 12/23/10 (from the past 48 hour(s))  CBC     Status: Abnormal   Collection Time   12/24/10 11:21 AM      Component Value Range Comment   WBC 10.8 (*) 4.0 - 10.5 (K/uL)    RBC 3.69 (*) 3.87 - 5.11 (MIL/uL)    Hemoglobin 11.7 (*) 12.0 - 15.0 (g/dL)    HCT 34.0 (*) 36.0 - 46.0 (%)    MCV 92.1  78.0 - 100.0 (fL)    MCH 31.7  26.0 - 34.0 (pg)    MCHC 34.4  30.0 - 36.0 (g/dL)    RDW 11.5  11.5 - 15.5 (%)    Platelets 99 (*) 150 - 400 (K/uL) CONSISTENT WITH PREVIOUS RESULT  CREATININE, SERUM     Status: Abnormal   Collection Time   12/24/10 11:21 AM      Component Value Range Comment   Creatinine, Ser 1.55 (*) 0.50 - 1.10 (mg/dL)    GFR calc non Af Amer 34 (*) >90 (mL/min)    GFR calc Af Amer 39 (*) >90 (mL/min)   TYPE AND SCREEN     Status: Normal (Preliminary result)   Collection Time   12/24/10  7:13 PM        Component Value Range Comment   ABO/RH(D) A POS      Antibody Screen NEG      Sample Expiration 12/27/2010      Unit Number [id number]      Blood Component Type RED CELLS,LR      Unit division 00      Status of Unit ISSUED      Transfusion Status OK TO TRANSFUSE      Crossmatch Result Compatible      Unit Number [id number]      Blood Component Type RED CELLS,LR      Unit division 00      Status of Unit ISSUED      Transfusion Status OK TO TRANSFUSE      Crossmatch Result Compatible      Unit Number [id number]      Blood Component Type RED CELLS,LR      Unit division 00      Status of Unit ALLOCATED      Transfusion Status OK TO TRANSFUSE      Crossmatch Result Compatible      Unit Number [id number]  Blood Component Type RED CELLS,LR      Unit division 00      Status of Unit ALLOCATED      Transfusion Status OK TO TRANSFUSE      Crossmatch Result Compatible      Unit Number [id number]      Blood Component Type RED CELLS,LR      Unit division 00      Status of Unit ALLOCATED      Transfusion Status OK TO TRANSFUSE      Crossmatch Result Compatible      Unit Number [id number]      Blood Component Type RED CELLS,LR      Unit division 00      Status of Unit ALLOCATED      Transfusion Status OK TO TRANSFUSE      Crossmatch Result Compatible     PREPARE PLATELET PHERESIS     Status: Normal (Preliminary result)   Collection Time   12/24/10  7:30 PM      Component Value Range Comment   Unit Number [id number]      Blood Component Type PLTPHER LR2      Unit division 00      Status of Unit ISSUED      Transfusion Status OK TO TRANSFUSE      Unit Number [id number]      Blood Component Type PLTPHER LR3      Unit division 00      Status of Unit ISSUED      Transfusion Status OK TO TRANSFUSE     BLOOD GAS, ARTERIAL     Status: Abnormal   Collection Time   12/25/10  2:06 AM      Component Value Range Comment   FIO2 .21      O2 Content .2      pH, Arterial 7.423 (*) 7.350  - 7.400     pCO2 arterial 38.0  35.0 - 45.0 (mmHg)    pO2, Arterial 85.8  80.0 - 100.0 (mmHg)    Bicarbonate 24.3 (*) 20.0 - 24.0 (mEq/L)    TCO2 25.5  0 - 100 (mmol/L)    Acid-Base Excess 0.4  0.0 - 2.0 (mmol/L)    O2 Saturation 98.0      Patient temperature 98.6      Collection site LEFT RADIAL      Drawn by [id number]      Sample type ARTERIAL DRAW      Allens test (pass/fail) PASS  PASS    BASIC METABOLIC PANEL     Status: Abnormal   Collection Time   12/25/10  4:03 AM      Component Value Range Comment   Sodium 138  135 - 145 (mEq/L)    Potassium 4.1  3.5 - 5.1 (mEq/L)    Chloride 102  96 - 112 (mEq/L)    CO2 25  19 - 32 (mEq/L)    Glucose, Bld 107 (*) 70 - 99 (mg/dL)    BUN 22  6 - 23 (mg/dL)    Creatinine, Ser 1.69 (*) 0.50 - 1.10 (mg/dL)    Calcium 9.7  8.4 - 10.5 (mg/dL)    GFR calc non Af Amer 30 (*) >90 (mL/min)    GFR calc Af Amer 35 (*) >90 (mL/min)   CBC     Status: Abnormal   Collection Time   12/25/10  4:03 AM      Component Value Range Comment   WBC 9.1  4.0 - 10.5 (  K/uL)    RBC 3.75 (*) 3.87 - 5.11 (MIL/uL)    Hemoglobin 11.8 (*) 12.0 - 15.0 (g/dL)    HCT 34.7 (*) 36.0 - 46.0 (%)    MCV 92.5  78.0 - 100.0 (fL)    MCH 31.5  26.0 - 34.0 (pg)    MCHC 34.0  30.0 - 36.0 (g/dL)    RDW 11.4 (*) 11.5 - 15.5 (%)    Platelets 104 (*) 150 - 400 (K/uL) CONSISTENT WITH PREVIOUS RESULT  HEMOGLOBIN A1C     Status: Abnormal   Collection Time   12/25/10  4:03 AM      Component Value Range Comment   Hemoglobin A1C 6.0 (*) <5.7 (%)    Mean Plasma Glucose 126 (*) <117 (mg/dL)   APTT     Status: Abnormal   Collection Time   12/25/10  4:03 AM      Component Value Range Comment   aPTT 41 (*) 24 - 37 (seconds)   POCT I-STAT 4, (NA,K, GLUC, HGB,HCT)     Status: Abnormal   Collection Time   12/25/10  8:04 AM      Component Value Range Comment   Sodium 139  135 - 145 (mEq/L)    Potassium 3.6  3.5 - 5.1 (mEq/L)    Glucose, Bld 99  70 - 99 (mg/dL)    HCT 30.0 (*) 36.0 - 46.0  (%)    Hemoglobin 10.2 (*) 12.0 - 15.0 (g/dL)   POCT I-STAT 3, BLOOD GAS (G3+)     Status: Abnormal   Collection Time   12/25/10  8:07 AM      Component Value Range Comment   pH, Arterial 7.359  7.350 - 7.400     pCO2 arterial 44.8  35.0 - 45.0 (mmHg)    pO2, Arterial 447.0 (*) 80.0 - 100.0 (mmHg)    Bicarbonate 25.3 (*) 20.0 - 24.0 (mEq/L)    TCO2 27  0 - 100 (mmol/L)    O2 Saturation 100.0      Sample type ARTERIAL     POCT I-STAT 4, (NA,K, GLUC, HGB,HCT)     Status: Abnormal   Collection Time   12/25/10  9:40 AM      Component Value Range Comment   Sodium 137  135 - 145 (mEq/L)    Potassium 3.4 (*) 3.5 - 5.1 (mEq/L)    Glucose, Bld 141 (*) 70 - 99 (mg/dL)    HCT 25.0 (*) 36.0 - 46.0 (%)    Hemoglobin 8.5 (*) 12.0 - 15.0 (g/dL)   POCT I-STAT 4, (NA,K, GLUC, HGB,HCT)     Status: Abnormal   Collection Time   12/25/10  9:52 AM      Component Value Range Comment   Sodium 138  135 - 145 (mEq/L)    Potassium 3.8  3.5 - 5.1 (mEq/L)    Glucose, Bld 116 (*) 70 - 99 (mg/dL)    HCT 17.0 (*) 36.0 - 46.0 (%)    Hemoglobin 5.8 (*) 12.0 - 15.0 (g/dL)   PREPARE RBC (CROSSMATCH)     Status: Normal   Collection Time   12/25/10 10:00 AM      Component Value Range Comment   Order Confirmation ORDER PROCESSED BY BLOOD BANK     POCT I-STAT 3, BLOOD GAS (G3+)     Status: Abnormal   Collection Time   12/25/10 10:01 AM      Component Value Range Comment   pH, Arterial 7.389  7.350 - 7.400  pCO2 arterial 38.5  35.0 - 45.0 (mmHg)    pO2, Arterial 414.0 (*) 80.0 - 100.0 (mmHg)    Bicarbonate 23.2  20.0 - 24.0 (mEq/L)    TCO2 24  0 - 100 (mmol/L)    O2 Saturation 100.0      Acid-base deficit 2.0  0.0 - 2.0 (mmol/L)    Sample type ARTERIAL     HEMOGLOBIN AND HEMATOCRIT, BLOOD     Status: Abnormal   Collection Time   12/25/10 10:28 AM      Component Value Range Comment   Hemoglobin 7.9 (*) 12.0 - 15.0 (g/dL) DELTA CHECK NOTED   HCT 22.5 (*) 36.0 - 46.0 (%)   PLATELET COUNT     Status:  Abnormal   Collection Time   12/25/10 10:28 AM      Component Value Range Comment   Platelets 65 (*) 150 - 400 (K/uL)   POCT I-STAT 4, (NA,K, GLUC, HGB,HCT)     Status: Abnormal   Collection Time   12/25/10 10:33 AM      Component Value Range Comment   Sodium 134 (*) 135 - 145 (mEq/L)    Potassium 5.8 (*) 3.5 - 5.1 (mEq/L)    Glucose, Bld 190 (*) 70 - 99 (mg/dL)    HCT 21.0 (*) 36.0 - 46.0 (%)    Hemoglobin 7.1 (*) 12.0 - 15.0 (g/dL)   PREPARE FRESH FROZEN PLASMA     Status: Normal (Preliminary result)   Collection Time   12/25/10 10:47 AM      Component Value Range Comment   Unit Number [id number]      Blood Component Type THAWED PLASMA      Unit division 00      Status of Unit ISSUED      Transfusion Status OK TO TRANSFUSE      Unit Number [id number]      Blood Component Type THAWED PLASMA      Unit division 00      Status of Unit ISSUED      Transfusion Status OK TO TRANSFUSE     POCT I-STAT 4, (NA,K, GLUC, HGB,HCT)     Status: Abnormal   Collection Time   12/25/10 11:33 AM      Component Value Range Comment   Sodium 134 (*) 135 - 145 (mEq/L)    Potassium 5.0  3.5 - 5.1 (mEq/L)    Glucose, Bld 214 (*) 70 - 99 (mg/dL)    HCT 20.0 (*) 36.0 - 46.0 (%)    Hemoglobin 6.8 (*) 12.0 - 15.0 (g/dL)   POCT I-STAT 3, BLOOD GAS (G3+)     Status: Abnormal   Collection Time   12/25/10 11:37 AM      Component Value Range Comment   pH, Arterial 7.330 (*) 7.350 - 7.400     pCO2 arterial 43.2  35.0 - 45.0 (mmHg)    pO2, Arterial 346.0 (*) 80.0 - 100.0 (mmHg)    Bicarbonate 22.8  20.0 - 24.0 (mEq/L)    TCO2 24  0 - 100 (mmol/L)    O2 Saturation 100.0      Acid-base deficit 3.0 (*) 0.0 - 2.0 (mmol/L)    Sample type ARTERIAL     POCT I-STAT 4, (NA,K, GLUC, HGB,HCT)     Status: Abnormal   Collection Time   12/25/10 12:46 PM      Component Value Range Comment   Sodium 137  135 - 145 (mEq/L)    Potassium 4.0  3.5 -  5.1 (mEq/L)    Glucose, Bld 154 (*) 70 - 99 (mg/dL)    HCT 22.0 (*)  36.0 - 46.0 (%)    Hemoglobin 7.5 (*) 12.0 - 15.0 (g/dL)   CBC     Status: Abnormal   Collection Time   12/25/10 12:48 PM      Component Value Range Comment   WBC 5.5  4.0 - 10.5 (K/uL)    RBC 2.52 (*) 3.87 - 5.11 (MIL/uL)    Hemoglobin 7.9 (*) 12.0 - 15.0 (g/dL)    HCT 22.5 (*) 36.0 - 46.0 (%)    MCV 89.3  78.0 - 100.0 (fL)    MCH 31.3  26.0 - 34.0 (pg)    MCHC 35.1  30.0 - 36.0 (g/dL)    RDW 12.8  11.5 - 15.5 (%)    Platelets 43 (*) 150 - 400 (K/uL) PLATELET COUNT CONFIRMED BY SMEAR  PROTIME-INR     Status: Abnormal   Collection Time   12/25/10 12:48 PM      Component Value Range Comment   Prothrombin Time 17.3 (*) 11.6 - 15.2 (seconds)    INR 1.39  0.00 - 1.49    APTT     Status: Abnormal   Collection Time   12/25/10 12:48 PM      Component Value Range Comment   aPTT 39 (*) 24 - 37 (seconds)   POCT I-STAT 3, BLOOD GAS (G3+)     Status: Abnormal   Collection Time   12/25/10 12:50 PM      Component Value Range Comment   pH, Arterial 7.400  7.350 - 7.400     pCO2 arterial 38.5  35.0 - 45.0 (mmHg)    pO2, Arterial 83.0  80.0 - 100.0 (mmHg)    Bicarbonate 24.2 (*) 20.0 - 24.0 (mEq/L)    TCO2 25  0 - 100 (mmol/L)    O2 Saturation 97.0      Acid-base deficit 1.0  0.0 - 2.0 (mmol/L)    Patient temperature 35.9 C      Collection site RADIAL, ALLEN'S TEST ACCEPTABLE      Drawn by Operator      Sample type ARTERIAL     GLUCOSE, CAPILLARY     Status: Abnormal   Collection Time   12/25/10  1:55 PM      Component Value Range Comment   Glucose-Capillary 111 (*) 70 - 99 (mg/dL)   GLUCOSE, CAPILLARY     Status: Normal   Collection Time   12/25/10  2:56 PM      Component Value Range Comment   Glucose-Capillary 83  70 - 99 (mg/dL)   GLUCOSE, CAPILLARY     Status: Normal   Collection Time   12/25/10  3:56 PM      Component Value Range Comment   Glucose-Capillary 86  70 - 99 (mg/dL)   POCT I-STAT 3, BLOOD GAS (G3+)     Status: Abnormal   Collection Time   12/25/10  5:35 PM       Component Value Range Comment   pH, Arterial 7.362  7.350 - 7.400     pCO2 arterial 38.7  35.0 - 45.0 (mmHg)    pO2, Arterial 133.0 (*) 80.0 - 100.0 (mmHg)    Bicarbonate 21.9  20.0 - 24.0 (mEq/L)    TCO2 23  0 - 100 (mmol/L)    O2 Saturation 99.0      Acid-base deficit 3.0 (*) 0.0 - 2.0 (mmol/L)    Patient temperature  37.2 C      Sample type ARTERIAL     GLUCOSE, CAPILLARY     Status: Abnormal   Collection Time   12/25/10  5:56 PM      Component Value Range Comment   Glucose-Capillary 146 (*) 70 - 99 (mg/dL)   POCT I-STAT 3, BLOOD GAS (G3+)     Status: Abnormal   Collection Time   12/25/10  6:43 PM      Component Value Range Comment   pH, Arterial 7.326 (*) 7.350 - 7.400     pCO2 arterial 40.7  35.0 - 45.0 (mmHg)    pO2, Arterial 70.0 (*) 80.0 - 100.0 (mmHg)    Bicarbonate 21.2  20.0 - 24.0 (mEq/L)    TCO2 22  0 - 100 (mmol/L)    O2 Saturation 92.0      Acid-base deficit 4.0 (*) 0.0 - 2.0 (mmol/L)    Patient temperature 37.4 C      Sample type ARTERIAL     MAGNESIUM     Status: Abnormal   Collection Time   12/25/10  6:45 PM      Component Value Range Comment   Magnesium 3.0 (*) 1.5 - 2.5 (mg/dL)   CREATININE, SERUM     Status: Abnormal   Collection Time   12/25/10  6:45 PM      Component Value Range Comment   Creatinine, Ser 1.34 (*) 0.50 - 1.10 (mg/dL)    GFR calc non Af Amer 40 (*) >90 (mL/min)    GFR calc Af Amer 47 (*) >90 (mL/min)   CBC     Status: Abnormal   Collection Time   12/25/10  6:45 PM      Component Value Range Comment   WBC 7.8  4.0 - 10.5 (K/uL)    RBC 2.70 (*) 3.87 - 5.11 (MIL/uL)    Hemoglobin 8.5 (*) 12.0 - 15.0 (g/dL)    HCT 24.1 (*) 36.0 - 46.0 (%)    MCV 89.3  78.0 - 100.0 (fL)    MCH 31.5  26.0 - 34.0 (pg)    MCHC 35.3  30.0 - 36.0 (g/dL)    RDW 13.3  11.5 - 15.5 (%)    Platelets 48 (*) 150 - 400 (K/uL) CONSISTENT WITH PREVIOUS RESULT  POCT I-STAT, CHEM 8     Status: Abnormal   Collection Time   12/25/10  6:47 PM      Component Value  Range Comment   Sodium 137  135 - 145 (mEq/L)    Potassium 4.0  3.5 - 5.1 (mEq/L)    Chloride 105  96 - 112 (mEq/L)    BUN 20  6 - 23 (mg/dL)    Creatinine, Ser 1.40 (*) 0.50 - 1.10 (mg/dL)    Glucose, Bld 147 (*) 70 - 99 (mg/dL)    Calcium, Ion 1.25  1.12 - 1.32 (mmol/L)    TCO2 19  0 - 100 (mmol/L)    Hemoglobin 8.2 (*) 12.0 - 15.0 (g/dL)    HCT 24.0 (*) 36.0 - 46.0 (%)   GLUCOSE, CAPILLARY     Status: Abnormal   Collection Time   12/25/10  8:01 PM      Component Value Range Comment   Glucose-Capillary 136 (*) 70 - 99 (mg/dL)    Comment 1 Notify RN      Comment 2 Documented in Chart     GLUCOSE, CAPILLARY     Status: Abnormal   Collection Time   12/25/10 10:07 PM  Component Value Range Comment   Glucose-Capillary 126 (*) 70 - 99 (mg/dL)   GLUCOSE, CAPILLARY     Status: Abnormal   Collection Time   12/26/10 12:19 AM      Component Value Range Comment   Glucose-Capillary 114 (*) 70 - 99 (mg/dL)   CBC     Status: Abnormal   Collection Time   12/26/10  4:00 AM      Component Value Range Comment   WBC 9.6  4.0 - 10.5 (K/uL)    RBC 2.70 (*) 3.87 - 5.11 (MIL/uL)    Hemoglobin 8.4 (*) 12.0 - 15.0 (g/dL)    HCT 24.0 (*) 36.0 - 46.0 (%)    MCV 88.9  78.0 - 100.0 (fL)    MCH 31.1  26.0 - 34.0 (pg)    MCHC 35.0  30.0 - 36.0 (g/dL)    RDW 13.3  11.5 - 15.5 (%)    Platelets 59 (*) 150 - 400 (K/uL) CONSISTENT WITH PREVIOUS RESULT  BASIC METABOLIC PANEL     Status: Abnormal   Collection Time   12/26/10  4:00 AM      Component Value Range Comment   Sodium 138  135 - 145 (mEq/L)    Potassium 4.0  3.5 - 5.1 (mEq/L)    Chloride 105  96 - 112 (mEq/L)    CO2 21  19 - 32 (mEq/L)    Glucose, Bld 111 (*) 70 - 99 (mg/dL)    BUN 19  6 - 23 (mg/dL)    Creatinine, Ser 1.53 (*) 0.50 - 1.10 (mg/dL)    Calcium 8.7  8.4 - 10.5 (mg/dL)    GFR calc non Af Amer 34 (*) >90 (mL/min)    GFR calc Af Amer 40 (*) >90 (mL/min)   MAGNESIUM     Status: Abnormal   Collection Time   12/26/10  4:00 AM       Component Value Range Comment   Magnesium 2.6 (*) 1.5 - 2.5 (mg/dL)   GLUCOSE, CAPILLARY     Status: Abnormal   Collection Time   12/26/10  8:15 AM      Component Value Range Comment   Glucose-Capillary 119 (*) 70 - 99 (mg/dL)    Comment 1 Notify RN       Imaging: Imaging results have been reviewed  Assessment/Plan:   1. Principal Problem: 2.  *S/P CABG x 3 3. Active Problems: 4.  Chest pain on exertion 5.  Chronic kidney disease (CKD), stage III (moderate) 6.  Hypertension 7.  Dyslipidemia 8.   Time Spent Directly with Patient:  20 minutes  Length of Stay:  LOS: 3 days   POD #1 CABG X 3. On low dose iv NTG. VSS. Exam benign. !+ RLE edema. Getting gentle diuresis. SCr 1.5. Good Sats. NSR. Continue current therapy per TCTS.  Lorretta Harp 12/26/2010, 9:36 AM

## 2010-12-27 ENCOUNTER — Inpatient Hospital Stay (HOSPITAL_COMMUNITY): Payer: Medicare Other

## 2010-12-27 LAB — CBC
Hemoglobin: 8.6 g/dL — ABNORMAL LOW (ref 12.0–15.0)
MCH: 31.4 pg (ref 26.0–34.0)
MCV: 90.5 fL (ref 78.0–100.0)
RBC: 2.74 MIL/uL — ABNORMAL LOW (ref 3.87–5.11)

## 2010-12-27 LAB — GLUCOSE, CAPILLARY
Glucose-Capillary: 97 mg/dL (ref 70–99)
Glucose-Capillary: 139 mg/dL — ABNORMAL HIGH (ref 70–99)
Glucose-Capillary: 123 mg/dL — ABNORMAL HIGH (ref 70–99)
Glucose-Capillary: 104 mg/dL — ABNORMAL HIGH (ref 70–99)

## 2010-12-27 LAB — BASIC METABOLIC PANEL
CO2: 28 mEq/L (ref 19–32)
Chloride: 99 mEq/L (ref 96–112)
Glucose, Bld: 142 mg/dL — ABNORMAL HIGH (ref 70–99)
Potassium: 4 mEq/L (ref 3.5–5.1)
Sodium: 133 mEq/L — ABNORMAL LOW (ref 135–145)

## 2010-12-27 MED ORDER — ACETAMINOPHEN 160 MG/5ML PO SOLN
975.0000 mg | Freq: Four times a day (QID) | ORAL | Status: DC
  Administered 2010-12-28 (×2): 650 mg via ORAL
  Filled 2010-12-27: qty 40.6

## 2010-12-27 MED ORDER — POTASSIUM CHLORIDE CRYS ER 20 MEQ PO TBCR
20.0000 meq | EXTENDED_RELEASE_TABLET | Freq: Every day | ORAL | Status: DC
  Administered 2010-12-27 – 2010-12-29 (×3): 20 meq via ORAL
  Filled 2010-12-27 (×3): qty 1

## 2010-12-27 MED ORDER — FUROSEMIDE 40 MG PO TABS
40.0000 mg | ORAL_TABLET | Freq: Every day | ORAL | Status: DC
  Administered 2010-12-27 – 2010-12-29 (×3): 40 mg via ORAL
  Filled 2010-12-27 (×3): qty 1

## 2010-12-27 NOTE — Progress Notes (Signed)
Subjective:  POD #2 CABG X3.  Chest wall pain. No dyspnea, no arrhythmia. Off all drips.  Objective:  Temp:  [97.8 F (36.6 C)-98.8 F (37.1 C)] 98.8 F (37.1 C) (12/16 0801) Pulse Rate:  [85-94] 85  (12/16 0700) Resp:  [10-20] 11  (12/16 0700) BP: (92-134)/(35-88) 92/55 mmHg (12/16 0700) SpO2:  [92 %-100 %] 97 % (12/16 0700) Weight:  [155 lb 13.8 oz (70.7 kg)] 155 lb 13.8 oz (70.7 kg) (12/16 0600) Weight change: 14 lb 5.3 oz (6.5 kg)  Intake/Output from previous day: 12/15 0701 - 12/16 0700 In: 1090 [P.O.:480; I.V.:510; IV Piggyback:100] Out: 2700 [Urine:2700]  Intake/Output from this shift:    Physical Exam: General appearance: alert, cooperative and no distress Lungs: clear to auscultation bilaterally Heart: regular rate and rhythm, S1, S2 normal, no murmur, click, rub or gallop Extremities: edema 1+ RLE  Lab Results: BMET    Component Value Date/Time   NA 133* 12/27/2010 0406   K 4.0 12/27/2010 0406   CL 99 12/27/2010 0406   CO2 28 12/27/2010 0406   GLUCOSE 142* 12/27/2010 0406   BUN 20 12/27/2010 0406   CREATININE 1.80* 12/27/2010 0406   CALCIUM 9.0 12/27/2010 0406   GFRNONAA 28* 12/27/2010 0406   GFRAA 33* 12/27/2010 0406    CBC    Component Value Date/Time   WBC 9.1 12/27/2010 0406   RBC 2.74* 12/27/2010 0406   HGB 8.6* 12/27/2010 0406   HCT 24.8* 12/27/2010 0406   PLT 80* 12/27/2010 0406   MCV 90.5 12/27/2010 0406   MCH 31.4 12/27/2010 0406   MCHC 34.7 12/27/2010 0406   RDW 13.0 12/27/2010 0406   LYMPHSABS 0.8 12/04/2008 1809   MONOABS 0.5 12/04/2008 1809   EOSABS 0.1 12/04/2008 1809   BASOSABS 0.0 12/04/2008 1809    Imaging: CXR L atelectasis/effusion  Assessment/Plan:   1. CAD p CABG day 2 without complications 2. CKD stage III, slight increase in creatinine postop (baseline 1.7 per H&P, early this admission  was 1.4-1.5) 3. HTN, well controlled; LVH on ECG and echo 4. Thrombocytopenia, slowly recovering postop 5 Anemia, acute blood  loss, mild to moderate 6. PAOD, infrapopliteal  Will follow.  Time Spent Directly with Patient:  20 minutes  Length of Stay:  LOS: 4 days   POD #1 CABG X 3. On low dose iv NTG. VSS. Exam benign. !+ RLE edema. Getting gentle diuresis. SCr 1.5. Good Sats. NSR. Continue current therapy per TCTS.  Kaylee Trivett 12/27/2010, 9:55 AM

## 2010-12-27 NOTE — Progress Notes (Signed)
2 Days Post-Op Procedure(s) (LRB): CORONARY ARTERY BYPASS GRAFTING (CABG) (N/A) Subjective: No complaints  Objective: Vital signs in last 24 hours: Temp:  [97.8 F (36.6 C)-98.8 F (37.1 C)] 98.8 F (37.1 C) (12/16 0801) Pulse Rate:  [85-94] 86  (12/16 1000) Cardiac Rhythm:  [-] Normal sinus rhythm (12/16 0800) Resp:  [10-20] 13  (12/16 1000) BP: (92-124)/(35-88) 102/48 mmHg (12/16 1000) SpO2:  [92 %-100 %] 99 % (12/16 1000) Weight:  [70.7 kg (155 lb 13.8 oz)] 155 lb 13.8 oz (70.7 kg) (12/16 0600)  Hemodynamic parameters for last 24 hours:    Intake/Output from previous day: 12/15 0701 - 12/16 0700 In: 1090 [P.O.:480; I.V.:510; IV Piggyback:100] Out: 2700 [Urine:2700] Intake/Output this shift: Total I/O In: 300 [P.O.:240; I.V.:60] Out: 250 [Urine:250]  General appearance: alert and cooperative Neurologic: intact Heart: regular rate and rhythm, S1, S2 normal, no murmur, click, rub or gallop Lungs: diminished breath sounds bibasilar Extremities: edema mild Wound: incision ok  Lab Results:  Basename 12/27/10 0406 12/26/10 1656  WBC 9.1 10.4  HGB 8.6* 8.8*  HCT 24.8* 25.5*  PLT 80* 78*   BMET:  Basename 12/27/10 0406 12/26/10 1656 12/26/10 1653 12/26/10 0400  NA 133* -- 136 --  K 4.0 -- 4.1 --  CL 99 -- 100 --  CO2 28 -- -- 21  GLUCOSE 142* -- 145* --  BUN 20 -- 20 --  CREATININE 1.80* 1.65* -- --  CALCIUM 9.0 -- -- 8.7    PT/INR:  Basename 12/25/10 1248  LABPROT 17.3*  INR 1.39   ABG    Component Value Date/Time   PHART 7.326* 12/25/2010 1843   HCO3 21.2 12/25/2010 1843   TCO2 24 12/26/2010 1653   ACIDBASEDEF 4.0* 12/25/2010 1843   O2SAT 92.0 12/25/2010 1843   CBG (last 3)   Basename 12/27/10 0759 12/27/10 0347 12/26/10 2346  GLUCAP 123* 139* 145*    Assessment/Plan: S/P Procedure(s) (LRB): CORONARY ARTERY BYPASS GRAFTING (CABG) (N/A) Mobilize Diuresis Chronic kidney disease:  Creat slightly above baseline.  Observe. Acute blood loss  anemia:  Observe, start Fe2+ Thrombocytopemia:  Improving.   LOS: 4 days    BARTLE,BRYAN K 12/27/2010

## 2010-12-28 DIAGNOSIS — D649 Anemia, unspecified: Secondary | ICD-10-CM

## 2010-12-28 LAB — TYPE AND SCREEN
ABO/RH(D): A POS
Unit division: 0
Unit division: 0
Unit division: 0
Unit division: 0

## 2010-12-28 LAB — GLUCOSE, CAPILLARY: Glucose-Capillary: 105 mg/dL — ABNORMAL HIGH (ref 70–99)

## 2010-12-28 MED ORDER — OXYCODONE HCL 5 MG PO TABS: 5.0000 mg | ORAL_TABLET | Freq: Four times a day (QID) | ORAL | Status: AC | PRN

## 2010-12-28 MED ORDER — METOPROLOL TARTRATE 25 MG PO TABS: 25.0000 mg | ORAL_TABLET | Freq: Two times a day (BID) | ORAL | Status: DC

## 2010-12-28 MED ORDER — SODIUM CHLORIDE 0.9 % IJ SOLN
3.0000 mL | Freq: Two times a day (BID) | INTRAMUSCULAR | Status: DC
  Administered 2010-12-28 – 2010-12-29 (×3): 3 mL via INTRAVENOUS

## 2010-12-28 MED ORDER — ACETAMINOPHEN 325 MG PO TABS: 650.0000 mg | ORAL_TABLET | Freq: Four times a day (QID) | ORAL | Status: DC | PRN

## 2010-12-28 MED ORDER — SODIUM CHLORIDE 0.9 % IV SOLN: 250.0000 mL | INTRAVENOUS | Status: DC | PRN

## 2010-12-28 MED ORDER — SODIUM CHLORIDE 0.9 % IJ SOLN: 3.0000 mL | INTRAMUSCULAR | Status: DC | PRN

## 2010-12-28 MED ORDER — MOVING RIGHT ALONG BOOK
Freq: Once | Status: AC
  Administered 2010-12-28: 16:00:00
  Filled 2010-12-28: qty 1

## 2010-12-28 MED ORDER — POVIDONE-IODINE 10 % EX SOLN
1.0000 "application " | Freq: Two times a day (BID) | CUTANEOUS | Status: DC
  Administered 2010-12-28 – 2010-12-29 (×3): 1 via TOPICAL
  Filled 2010-12-28: qty 15

## 2010-12-28 MED ORDER — POTASSIUM CHLORIDE CRYS ER 20 MEQ PO TBCR: 20.0000 meq | EXTENDED_RELEASE_TABLET | Freq: Every day | ORAL | Status: DC

## 2010-12-28 MED ORDER — FUROSEMIDE 40 MG PO TABS: 40.0000 mg | ORAL_TABLET | Freq: Every day | ORAL | Status: DC

## 2010-12-28 MED FILL — Sodium Chloride IV Soln 0.9%: INTRAVENOUS | Qty: 1000 | Status: AC

## 2010-12-28 MED FILL — Electrolyte-R (PH 7.4) Solution: INTRAVENOUS | Qty: 6000 | Status: AC

## 2010-12-28 MED FILL — Heparin Sodium (Porcine) Inj 1000 Unit/ML: INTRAMUSCULAR | Qty: 60 | Status: AC

## 2010-12-28 MED FILL — Sodium Chloride Irrigation Soln 0.9%: Qty: 3000 | Status: AC

## 2010-12-28 NOTE — Discharge Summary (Signed)
I agree with the above discharge summary and plan for follow-up.  Raeanna Soberanes H  

## 2010-12-28 NOTE — Discharge Summary (Signed)
[facility] [address] [address]     Bich Franca 02-Jun-1945 65 y.o. 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 The patient presents with recent onset symptoms of exertional chest discomfort and shortness of breath consistent with angina pectoris. She was referred to Dr. Ellyn Hack who scheduled her for elective cardiac catheterization. Cardiac catheterization demonstrates severe three-vessel coronary artery disease with normal left ventricular function. Cardiothoracic surgical consultation has been requested to consider elective surgical revascularization. Dr. Roxy Manns evaluated the patient and her studies and agreed with recommendations to proceed with coronary artery surgical revascularization.  Past Medical History   Diagnosis  Date   .  Hypertension    .  Renal insufficiency    .  Hyperlipidemia    .  Degenerative disc disease, lumbar    .  Degenerative disc disease, cervical    .  Renal cell carcinoma  11/18/2008     T2aNx s/p partial left nephrectomy   .  TIA (transient ischemic attack)     Past Surgical History   Procedure  Date   .  Partial nephrectomy  11/18/2008     left partial nephrectomy for renal cell CA   .  Anterior cervical decomp/discectomy fusion  10/11/2005     multi-level    Family History   Problem  Relation  Age of Onset   .  Coronary artery disease  Father    .  Coronary artery disease  Sister    .  Coronary artery disease  Brother     Social History  History   Substance Use Topics   .  Smoking status:  Never Smoker   .  Smokeless tobacco:  Not on file   .  Alcohol Use:  Yes    Current Facility-Administered  Medications At time of consultation:   Medication  Dose  Route  Frequency  Provider  Last Rate  Last Dose   .  0.9 % sodium chloride infusion  1 mL/kg/hr  Intravenous  Continuous  Leonie Man     .  0.9 % sodium chloride infusion  250 mL  Intravenous  PRN  Leonie Man     .  acetaminophen (TYLENOL) tablet 650 mg  650 mg  Oral  Q4H PRN  Leonie Man     .  albuterol (PROVENTIL) (5 MG/ML) 0.5% nebulizer solution 2.5 mg  2.5 mg  Nebulization  Once  Rexene Alberts, MD     .  aspirin chewable tablet 324 mg  324 mg  Oral  Pre-Cath  Laverle Hobby, PA   324 mg at 12/24/10 0756   .  aspirin chewable tablet 81 mg  81 mg  Oral  Daily  Leonie Man     .  atenolol (TENORMIN) tablet 25 mg  25 mg  Oral  Daily  Leonie Man   25 mg at 12/24/10 1417   .  diazepam (VALIUM) tablet 5 mg  5 mg  Oral  On Call  Leonie Man   5 mg at 12/24/10 [id number]   .  fentaNYL (SUBLIMAZE) 0.05 MG/ML injection         .  furosemide (LASIX) tablet 20 mg  20 mg  Oral  Daily  Leonie Man   20 mg at 12/24/10 1411   .  heparin 2-0.9 UNIT/ML-% infusion         .  heparin injection 5,000 Units  5,000 Units  Subcutaneous  Q8H  Leonie Man     .  HYDROcodone-acetaminophen (NORCO) 5-325 MG per tablet 2 tablet  2 tablet  Oral  Q12H  Otila Back, PHARMD     .  lidocaine (XYLOCAINE) 1 % injection         .  midazolam (VERSED) 2 MG/2ML injection         .  midazolam (VERSED) 2 MG/2ML injection         .  niacin (NIASPAN) CR tablet 2,000 mg  2,000 mg  Oral  QODAY  Laverle Hobby, PA   2,000 mg at 12/24/10 0015   .  nitroGLYCERIN (NTG ON-CALL) 0.2 mg/mL injection         .  olmesartan (BENICAR) tablet 10 mg  10 mg  Oral  Daily  Leonie Man   10 mg at 12/24/10 1413   .  ondansetron (ZOFRAN) injection 4 mg  4 mg  Intravenous  Q6H PRN  Leonie Man     .  pantoprazole (PROTONIX) EC tablet 40 mg  40 mg  Oral  Q1200  Laverle Hobby, PA   40 mg at 12/24/10 1413   .  simvastatin (ZOCOR) tablet 20 mg   20 mg  Oral  q1800  Laverle Hobby, PA     .  sodium chloride 0.9 % injection 3 mL  3 mL  Intravenous  Q12H  Leonie Man   3 mL at 12/24/10 1413   .  sodium chloride 0.9 % injection 3 mL  3 mL  Intravenous  PRN  Leonie Man     .  verapamil (ISOPTIN) 2.5 MG/ML injection         .  DISCONTD: 0.9 % sodium chloride infusion  250 mL  Intravenous  PRN  Leonie Man     .  DISCONTD: 0.9 % sodium chloride infusion   Intravenous  Continuous  Leonie Man  68 mL/hr at 12/24/10 [id number]    .  DISCONTD: 0.9 % sodium chloride infusion  250 mL  Intravenous  PRN  Laverle Hobby, PA     .  DISCONTD: acetaminophen (TYLENOL) tablet 650 mg  650 mg  Oral  Q4H PRN  Leonie Man     .  DISCONTD: HYDROcodone-acetaminophen (NORCO) 10-325 MG per tablet 1 tablet  1 tablet  Oral  BID  Leonie Man     .  DISCONTD: magnesium oxide (MAG-OX) tablet 400 mg  400 mg  Oral  Daily  Laverle Hobby, PA   400 mg at 12/24/10 0015   .  DISCONTD: ondansetron (ZOFRAN) injection 4 mg  4 mg  Intravenous  Q6H PRN  Leonie Man     .  DISCONTD: sodium chloride 0.9 % injection 3 mL  3 mL  Intravenous  Q12H  Leonie Man     .  DISCONTD: sodium chloride 0.9 % injection 3 mL  3 mL  Intravenous  PRN  Leonie Man     .  DISCONTD: sodium chloride 0.9 % injection 3 mL  3 mL  Intravenous  Q12H  Laverle Hobby, PA     .  DISCONTD: sodium chloride 0.9 % injection 3 mL  3 mL  Intravenous  PRN  Laverle Hobby, PA      Allergies   Allergen  Reactions   .  Codeine  Nausea Only   .  Erythromycin  Itching    Review of Systems: At time of consultation:  General: normal appetite, normal energy  Respiratory: no cough, no wheezing, no hemoptysis, no pain with inspiration or cough, no shortness of breath  Cardiac: + exertional chest pain or tightness over last several weeks, associated with exertional SOB, no resting chest pain nor resting SOB, no PND, no orthopnea, no LE edema, no palpitations, no syncope  GI: Occasional  mild difficulty swallowing, occasional hematochezia but negative colonoscopy by report, no hematemesis, no melena, no constipation, no diarrhea  GU: + mild dysuria, no urgency, no frequency  Musculoskeletal: + arthritis involving lower back and legs, R>L  Vascular: no pain suggestive of claudication  Neuro: no recent symptoms suggestive of TIA's although she had 2 spells several years ago, no seizures, no headaches, no peripheral neuropathy except occasional numbness and paresthesias right arm and hand  Endocrine: Negative  HEENT: no loose teeth or painful teeth, no recent vision changes  Psych: no anxiety, no depression  Physical Exam: Time of consultation:  BP 133/70  Pulse 79  Temp(Src) 97.7 F (36.5 C) (Axillary)  Resp 15  Ht 5' (1.524 m)  Wt 67.4 kg (148 lb 9.4 oz)  BMI 29.02 kg/m2  SpO2 100%  General: well-appearing  HEENT: Unremarkable  Neck: no JVD, no bruits, no adenopathy  Chest: clear to auscultation, symmetrical breath sounds, no wheezes, no rhonchi  CV: RRR, no murmur  Abdomen: soft, non-tender, no masses  Extremities: warm, well-perfused, pulses diminished but palpable, no LE edema, no varicose veins nor other signs of venous insufficiency  Rectal/GU Deferred  Neuro: Grossly non-focal and symmetrical throughout  Skin: Clean and dry, no rashes, no breakdown  Diagnostic Tests:  Echocardiogram (12/08/2010):  By report demonstrating mild concentric LVH with normal LVEF & wall motion, aortic sclerosis and mitral annular calcification (outpatient study - full report and films not currently available) Cardiac catheterization (12/24/2010):  Severe Multivessel CAD. Diffusely calcified proximal LCA.  LM - tapers to distal ~40-50%  LAD -- proximal tubular ~70%, followed by diffuse 50-60% then mid focal ~80% @ D2 & SP3 takeoff, several septal trunks in this proximal region. Moderate caliber D1-bifurcating (one branch diffusely diseased)  LCx - ostial 95%, calcified, remainder of  vessel is large caliber, non-dominant vessel that terminates as a large branching OMB.  Small-moderate caliber RI / High OM1 -- diffusely diseased up to 70-80%, but perfuses large distribution  RCA - prox-distal with minimal luminal irregularities (worst is tubular 40%);  Prox RPDA long tubular 80-90%,  Large RPL system with ~3 PLBs, most significant lesion ~40% Impression:  Severe three-vessel coronary artery disease with normal left ventricular function and recent onset symptoms of angina pectoris. I've reviewed the patient's cath films and agree that surgical revascularization would likely represent the best treatment option. The patient does have fairly diffuse disease with relatively small target vessels for grafting, but overall her distal target vessels appear graftable and her coronary anatomy is certainly not amenable to percutaneous coronary intervention. Risks of surgery will be slightly increased because of the patient's history of chronic renal insufficiency and possible cerebrovascular disease. However, overall the patient appears  to be reasonably good candidate for coronary artery bypass grafting.    Hospital Course:  The patient was medically stable to proceed with surgery on 12/14/2012and she underwent the following procedure:  Date of Procedure: 12/25/2010  Preoperative Diagnosis:  Severe 3-vessel Coronary Artery Disease Postoperative Diagnosis:  Same Procedure:  Coronary Artery Bypass Grafting x 3  Left Internal Mammary Artery to Distal Left Anterior Descending Coronary Artery  Saphenous Vein Graft to Right Posterolateral Branch Coronary Artery  Saphenous Vein Graft to Obtuse Marginal Branch of Left Circumflex Coronary Artery  Endoscopic Vein Harvest from Right Thigh and Lower Leg Surgeon: Valentina Gu. Roxy Manns, MD  Assistant: Jadene Pierini, PA-C  Anesthesia: Annye Asa, MD  Operative Findings:  Normal left ventricular systolic function  Good quality left internal mammary  artery conduit  Fair quality saphenous vein conduit  Fair quality target vessels for grafting  Relatively small and diffusely diseased distal vessels   The patient tolerated the procedure well and was taken to the surgical intensive care unit in stable condition.  Postoperative hospital course:  Overall the patient has progressed nicely. He was weaned from the ventilator without difficulty. All routine lines, monitors, and drainage devices have been discontinued in the standard fashion. She does have an acute blood loss anemia. Her most recent hemoglobin and hematocrit dated 12/27/2010 are 8.6 and 24.8 respectively. He did also have a slight increase in her creatinine. He does have a chronic renal insufficiency most recent creatinine is 1.80 and is being monitored.he does have some volume overload but is responding well to diuretics. He also has a thrombocytopenia but values appear to be stabilized.most recent platelet count is 80,000 on 12/27/2010. She is responding well to her routine rehabilitation using standard protocols. Incisions are healing well without evidence of infection. He is felt to be healing well and tentatively is scheduled for discharge possibly as soon as tomorrow morning pending further recovery.   Basename 12/27/10 0406 12/26/10 1653 12/26/10 0400  NA 133* 136 --  K 4.0 4.1 --  CL 99 100 --  CO2 28 -- 21  GLUCOSE 142* 145* --  BUN 20 20 --  CALCIUM 9.0 -- 8.7    Basename 12/27/10 0406 12/26/10 1656  WBC 9.1 10.4  HGB 8.6* 8.8*  HCT 24.8* 25.5*  PLT 80* 78*   No results found for this basename: INR:2 in the last 72 hours   Discharge Instructions:  The patient is discharged to home with extensive instructions on wound care and progressive ambulation.  They are instructed not to drive or perform any heavy lifting until returning to see the physician in his office.  Discharge Diagnosis:  Severe coronary artery disease  Secondary Diagnosis: Patient Active  Problem List  Diagnoses  . Chest pain on exertion  . Renal cell carcinoma  . Chronic kidney disease (CKD), stage III (moderate)  . Hypertension  . Dyslipidemia  . S/P CABG x 3  . Anemia   Past Medical History  Diagnosis Date  . Hypertension   . Renal insufficiency   . Hyperlipidemia   . Degenerative disc disease, cervical 12/24/10    "they are not going to operate; they told me to handle it with RXs"  . Renal cell carcinoma 11/18/2008    T2aNx s/p partial left nephrectomy  . TIA (transient ischemic attack) 1992 &  2010  . Coronary artery disease   . Angina   . Shortness of breath on exertion   . Shortness of breath 12/24/10    "recently  w/lying down and at rest sometimes; not real often"  . Asthma     "when I was a child"  . Pneumonia     "2-3 times"  . GERD (gastroesophageal reflux disease)   . Stroke   . Arthritis        Millian, Vestal  Home Medication Instructions [id number]   Printed on:12/28/10 1444  Medication Information                    omeprazole (PRILOSEC) 40 MG capsule Take 40 mg by mouth daily.             MAGNESIUM PO Take 400 mg by mouth daily.             pravastatin (PRAVACHOL) 40 MG tablet Take 40 mg by mouth at bedtime.             aspirin 81 MG tablet Take 81 mg by mouth daily.             niacin (NIASPAN) 1000 MG CR tablet Take 1,000 mg by mouth at bedtime. And Takes 2000 mg every other night            furosemide (LASIX) 40 MG tablet Take 1 tablet (40 mg total) by mouth daily.           metoprolol tartrate (LOPRESSOR) 25 MG tablet Take 1 tablet (25 mg total) by mouth 2 (two) times daily.           oxyCODONE (OXY IR/ROXICODONE) 5 MG immediate release tablet Take 1-2 tablets (5-10 mg total) by mouth every 6 (six) hours as needed.           potassium chloride SA (K-DUR,KLOR-CON) 20 MEQ tablet Take 1 tablet (20 mEq total) by mouth daily.             Disposition: discharged to home  Patient's condition is Good  Jadene Pierini,  PA-C 12/28/2010  2:44 PM

## 2010-12-28 NOTE — Progress Notes (Signed)
   CARDIOTHORACIC SURGERY PROGRESS NOTE   R3 Days Post-Op Procedure(s) (LRB): CORONARY ARTERY BYPASS GRAFTING (CABG) (N/A)  Subjective: Feels well. No complaints except that she still has foley catheter in place.  Wants to get up more.  Objective: Vital signs: BP Readings from Last 1 Encounters:  12/28/10 91/57   Pulse Readings from Last 1 Encounters:  12/28/10 90   Resp Readings from Last 1 Encounters:  12/28/10 13   Temp Readings from Last 1 Encounters:  12/28/10 98.2 F (36.8 C) Oral    Hemodynamics:    Physical Exam:  Rhythm:   sinus  Breath sounds: clear  Heart sounds:  RRR  Incisions:  Clean and dry  Abdomen:  soft  Extremities:  Warm, no edema   Intake/Output from previous day: 12/16 0701 - 12/17 0700 In: 960 [P.O.:780; I.V.:180] Out: 2685 [Urine:2685] Intake/Output this shift:    Lab Results:  Basename 12/27/10 0406 12/26/10 1656  WBC 9.1 10.4  HGB 8.6* 8.8*  HCT 24.8* 25.5*  PLT 80* 78*   BMET:  Basename 12/27/10 0406 12/26/10 1656 12/26/10 1653 12/26/10 0400  NA 133* -- 136 --  K 4.0 -- 4.1 --  CL 99 -- 100 --  CO2 28 -- -- 21  GLUCOSE 142* -- 145* --  BUN 20 -- 20 --  CREATININE 1.80* 1.65* -- --  CALCIUM 9.0 -- -- 8.7    CBG (last 3)   Basename 12/28/10 0747 12/28/10 0403 12/27/10 2343  GLUCAP 105* 122* 125*   ABG    Component Value Date/Time   PHART 7.326* 12/25/2010 1843   HCO3 21.2 12/25/2010 1843   TCO2 24 12/26/2010 1653   ACIDBASEDEF 4.0* 12/25/2010 1843   O2SAT 92.0 12/25/2010 1843   CXR: n/a  Assessment/Plan: S/P Procedure(s) (LRB): CORONARY ARTERY BYPASS GRAFTING (CABG) (N/A)  Doing well POD #3. Needs to be allowed to progress. Possibly ready for d/c home 1-2 days. Expected post op acute blood loss anemia, stable.   [name],[name] 12/28/2010 8:11 AM

## 2010-12-28 NOTE — Progress Notes (Signed)
[facility]  Subjective: No dyspnea or orthopnea.  +chest wall tenderness  Objective: Vital signs in last 24 hours: Temp:  [97.5 F (36.4 C)-98.2 F (36.8 C)] 98.2 F (36.8 C) (12/17 0728) Pulse Rate:  [81-100] 99  (12/17 1000) Resp:  [12-20] 20  (12/17 1000) BP: (86-125)/(48-64) 89/62 mmHg (12/17 1000) SpO2:  [91 %-99 %] 95 % (12/17 1000) Weight:  [68.9 kg (151 lb 14.4 oz)] 151 lb 14.4 oz (68.9 kg) (12/17 0600) Last BM Date: 12/24/10  Intake/Output from previous day: 12/16 0701 - 12/17 0700 In: 960 [P.O.:780; I.V.:180] Out: 2685 [Urine:2685] Intake/Output this shift: Total I/O In: 100 [P.O.:100] Out: 225 [Urine:225]  Medications Current Facility-Administered Medications  Medication Dose Route Frequency Provider Last Rate Last Dose  . 0.9 %  sodium chloride infusion  250 mL Intravenous PRN Rexene Alberts, MD      . acetaminophen (TYLENOL) tablet 650 mg  650 mg Oral Q6H PRN Rexene Alberts, MD      . bisacodyl (DULCOLAX) EC tablet 10 mg  10 mg Oral Daily Rexene Alberts, MD   10 mg at 12/28/10 0957   Or  . bisacodyl (DULCOLAX) suppository 10 mg  10 mg Rectal Daily Rexene Alberts, MD      . docusate sodium (COLACE) capsule 200 mg  200 mg Oral Daily Rexene Alberts, MD   200 mg at 12/28/10 0957  . furosemide (LASIX) tablet 40 mg  40 mg Oral Daily Gaye Pollack, MD   40 mg at 12/28/10 0957  . metoprolol tartrate (LOPRESSOR) tablet 25 mg  25 mg Oral BID Gaye Pollack, MD   25 mg at 12/27/10 1000  . moving right along book   Does not apply Once Rexene Alberts, MD      . niacin (NIASPAN) CR tablet 2,000 mg  2,000 mg Oral QODAY Laverle Hobby, PA   2,000 mg at 12/24/10 0015  . ondansetron (ZOFRAN) injection 4 mg  4 mg Intravenous Q6H PRN Rexene Alberts, MD   4 mg at 12/26/10 1247  . oxyCODONE (Oxy IR/ROXICODONE) immediate release tablet 5-10 mg  5-10 mg Oral Q3H PRN Rexene Alberts, MD   10 mg at 12/27/10 1207  . pantoprazole (PROTONIX) EC  tablet 40 mg  40 mg Oral Q1200 Rexene Alberts, MD   40 mg at 12/27/10 1208  . potassium chloride SA (K-DUR,KLOR-CON) CR tablet 20 mEq  20 mEq Oral Daily Gaye Pollack, MD   20 mEq at 12/28/10 0957  . povidone-iodine (BETADINE) 10 % external solution 1 application  1 application Topical BID Rexene Alberts, MD      . simvastatin (ZOCOR) tablet 20 mg  20 mg Oral q1800 Laverle Hobby, PA   20 mg at 12/27/10 1827  . sodium chloride 0.9 % injection 3 mL  3 mL Intravenous Q12H Rexene Alberts, MD      . sodium chloride 0.9 % injection 3 mL  3 mL Intravenous PRN Rexene Alberts, MD      . DISCONTD: 0.45 % sodium chloride infusion   Intravenous Continuous Rexene Alberts, MD 20 mL/hr at 12/25/10 1300 20 mL/hr at 12/25/10 1300  . DISCONTD: 0.9 %  sodium chloride infusion   Intravenous Continuous Rexene Alberts, MD 40 mL/hr at 12/25/10 1300    . DISCONTD: 0.9 %  sodium chloride infusion  250 mL Intravenous Continuous Rexene Alberts, MD      .  DISCONTD: acetaminophen (TYLENOL) solution 975 mg  975 mg Per Tube Q6H Rexene Alberts, MD   975 mg at 12/27/10 1815  . DISCONTD: acetaminophen (TYLENOL) solution 975 mg  975 mg Oral Q6H Gaye Pollack, MD   650 mg at 12/28/10 0656  . DISCONTD: aspirin chewable tablet 324 mg  324 mg Per Tube Daily Rexene Alberts, MD      . DISCONTD: aspirin EC tablet 325 mg  325 mg Oral Daily Rexene Alberts, MD      . DISCONTD: insulin aspart (novoLOG) injection 0-24 Units  0-24 Units Subcutaneous Q4H Gaye Pollack, MD   2 Units at 12/28/10 0006  . DISCONTD: insulin regular (NOVOLIN R,HUMULIN R) 1 Units/mL in sodium chloride 0.9 % 100 mL infusion   Intravenous Continuous Rexene Alberts, MD      . DISCONTD: lactated ringers infusion   Intravenous Continuous Rexene Alberts, MD      . DISCONTD: metoprolol (LOPRESSOR) injection 2.5-5 mg  2.5-5 mg Intravenous Q2H PRN Rexene Alberts, MD   2.5 mg at 12/26/10 0415  . DISCONTD: metoprolol tartrate (LOPRESSOR) 25 mg/10 mL oral  suspension 25 mg  25 mg Per Tube BID Gaye Pollack, MD      . DISCONTD: midazolam (VERSED) injection 2 mg  2 mg Intravenous Q1H PRN Rexene Alberts, MD      . DISCONTD: morphine 4 MG/ML injection 2-5 mg  2-5 mg Intravenous Q1H PRN Rexene Alberts, MD   2 mg at 12/27/10 0405  . DISCONTD: sodium chloride 0.9 % injection 3 mL  3 mL Intravenous Q12H Rexene Alberts, MD   3 mL at 12/28/10 0007  . DISCONTD: sodium chloride 0.9 % injection 3 mL  3 mL Intravenous PRN Rexene Alberts, MD        PE: General appearance: alert, cooperative and no distress Lungs: Decreased BS bilaterally. Heart: regular rate and rhythm, S1, S2 normal, no murmur, click, rub or gallop Extremities: 1+ right LEE Pulses: 2+ and symmetric  Lab Results:   Basename 12/27/10 0406 12/26/10 1656 12/26/10 1653 12/26/10 0400  WBC 9.1 10.4 -- 9.6  HGB 8.6* 8.8* 8.2* --  HCT 24.8* 25.5* 24.0* --  PLT 80* 78* -- 59*   BMET  Basename 12/27/10 0406 12/26/10 1656 12/26/10 1653 12/26/10 0400  NA 133* -- 136 138  K 4.0 -- 4.1 4.0  CL 99 -- 100 105  CO2 28 -- -- 21  GLUCOSE 142* -- 145* 111*  BUN 20 -- 20 19  CREATININE 1.80* 1.65* 1.60* --  CALCIUM 9.0 -- -- 8.7   PT/INR  Basename 12/25/10 1248  LABPROT 17.3*  INR 1.39   Studies/Results: PORTABLE CHEST - 1 VIEW  Comparison: 12/26/2010.  Findings: Left-sided chest tube has been removed with atelectasis  along the course of the removed chest tube.  Swan-Ganz catheter removed with introducer tip at the level of the  proximal superior vena cava. No gross pneumothorax.  Mediastinal drain removed.  Cardiomegaly. Basilar consolidation greater on the left may  represent atelectasis with pleural effusion. Infiltrate not  entirely excluded in the proper clinical setting.  Mildly tortuous aorta.  Post CABG.  Epicardial leads project over the left upper quadrant of the  abdomen.  IMPRESSION:  Removal of Swan-Ganz catheter, mediastinal drains and left-sided  chest tube.  Otherwise no significant change as detailed above.  Assessment/Plan  Principal Problem:  *S/P CABG x 3 Active Problems:  Chest pain on exertion  Chronic kidney disease (CKD), stage III (moderate)  Hypertension  Dyslipidemia  Anemia  Plan:  POD #3.  Looks great.  BP a little soft. Emphasized the use of incentive spirometer.  Diuresing well.   LOS: 5 days    HAGER,BRYAN W 12/28/2010 10:29 AM  I have seen and examined the patient along with Tarri Fuller, PA.  I have reviewed the chart, notes and new data.  I agree with PA's note.  Key new complaints: discomfort at sternotomy site otherwise no complaint Key examination changes: no perioperative arrhythmia Key new findings / data: no BMET or platelet ct. today   PLAN: : Continue aspirin beta blockers and statin; recheck platelet count and creatinine. thrombocytopenia pattern is not typical for HIT, but this diagnosis should be entertained check HIT. panel Sanda Klein, MD, [facility] [phone] 12/28/2010, 12:59 PM

## 2010-12-29 ENCOUNTER — Encounter (HOSPITAL_COMMUNITY): Payer: Self-pay | Admitting: Thoracic Surgery (Cardiothoracic Vascular Surgery)

## 2010-12-29 ENCOUNTER — Inpatient Hospital Stay (HOSPITAL_COMMUNITY): Payer: Medicare Other

## 2010-12-29 LAB — CBC
Hemoglobin: 8.8 g/dL — ABNORMAL LOW (ref 12.0–15.0)
MCH: 30.8 pg (ref 26.0–34.0)
MCV: 90.9 fL (ref 78.0–100.0)
Platelets: 141 10*3/uL — ABNORMAL LOW (ref 150–400)
RBC: 2.86 MIL/uL — ABNORMAL LOW (ref 3.87–5.11)
WBC: 7.3 10*3/uL (ref 4.0–10.5)

## 2010-12-29 LAB — BASIC METABOLIC PANEL
CO2: 30 mEq/L (ref 19–32)
Chloride: 101 mEq/L (ref 96–112)
Glucose, Bld: 101 mg/dL — ABNORMAL HIGH (ref 70–99)
Sodium: 137 mEq/L (ref 135–145)

## 2010-12-29 NOTE — Progress Notes (Signed)
Subjective: No complaints  Objective: Vital signs in last 24 hours: Temp:  [97.1 F (36.2 C)-98.2 F (36.8 C)] 97.1 F (36.2 C) (12/18 0444) Pulse Rate:  [96-104] 96  (12/18 0444) Resp:  [15-22] 16  (12/18 0444) BP: (91-123)/(52-67) 123/67 mmHg (12/18 0444) SpO2:  [92 %-99 %] 92 % (12/18 0444) Weight:  [67.9 kg (149 lb 11.1 oz)] 149 lb 11.1 oz (67.9 kg) (12/18 0444) Weight change: -1 kg (-2 lb 3.3 oz) Last BM Date: 12/24/10 Intake/Output from previous day: 12/17 0701 - 12/18 0700 In: 580 [P.O.:580] Out: 225 [Urine:225] Intake/Output this shift: Total I/O In: 240 [P.O.:240] Out: 300 [Urine:300]  PE: General: Heart: Lungs: Abd: Ext:   Lab Results:  Basename 12/29/10 0540 12/27/10 0406  WBC 7.3 9.1  HGB 8.8* 8.6*  HCT 26.0* 24.8*  PLT 141* 80*   BMET  Basename 12/29/10 0540 12/27/10 0406  NA 137 133*  K 4.0 4.0  CL 101 99  CO2 30 28  GLUCOSE 101* 142*  BUN 32* 20  CREATININE 1.80* 1.80*  CALCIUM 9.5 9.0   No results found for this basename: TROPONINI:2,CK,MB:2 in the last 72 hours  Lab Results  Component Value Date   CHOL 148 12/24/2010   HDL 59 12/24/2010   LDLCALC 77 12/24/2010   TRIG 58 12/24/2010   CHOLHDL 2.5 12/24/2010   Lab Results  Component Value Date   HGBA1C 6.0* 12/25/2010     No results found for this basename: TSH    Hepatic Function Panel No results found for this basename: PROT,ALBUMIN,AST,ALT,ALKPHOS,BILITOT,BILIDIR,IBILI in the last 72 hours No results found for this basename: CHOL in the last 72 hours No results found for this basename: PROTIME in the last 72 hours    EKG: Orders placed during the hospital encounter of 12/23/10  . EKG 12-LEAD  . EKG 12-LEAD    Studies/Results: Dg Chest 2 View  12/29/2010  *RADIOLOGY REPORT*  Clinical Data: Chest pain.  CHEST - 2 VIEW  Comparison: 12/27/2010  Findings: Prior CABG.  Bibasilar opacities and small effusions. Left mid lung atelectasis.  Slight improvement in aeration since  prior study.  No pneumothorax.  IMPRESSION: Slight improvement in aeration of the bases.  Continued bibasilar atelectasis and small effusions.  Original Report Authenticated By: Raelyn Number, M.D.    Medications: I have reviewed the patient's current medications.  Assessment/Plan:4 Days Post-Op S/P Procedure(s) (LRB):  CORONARY ARTERY BYPASS GRAFTING (CABG) (N/A)   Patient Active Problem List  Diagnoses  . Chest pain on exertion  . Renal cell carcinoma  . Chronic kidney disease (CKD), stage III (moderate)  . Hypertension  . Dyslipidemia  . S/P CABG x 3  . Anemia   PLAN:Ready for discharge home    LOS: 6 days   INGOLD,LAURA R 12/29/2010, 10:41 AM Agree with note written by Cecilie Kicks RNP  AS/P CABG. Home today per [facility].    Lorretta Harp 12/29/2010 11:50 AM

## 2010-12-29 NOTE — Progress Notes (Signed)
   CARDIOTHORACIC SURGERY PROGRESS NOTE  4 Days Post-Op  S/P Procedure(s) (LRB): CORONARY ARTERY BYPASS GRAFTING (CABG) (N/A)  Subjective: Feels well. No complaints.  Objective: Vital signs in last 24 hours: Temp:  [97.1 F (36.2 C)-98.2 F (36.8 C)] 97.1 F (36.2 C) (12/18 0444) Pulse Rate:  [96-104] 96  (12/18 0444) Cardiac Rhythm:  [-] Normal sinus rhythm;Sinus tachycardia;Other (Comment) (12/17 2000) Resp:  [15-22] 16  (12/18 0444) BP: (89-123)/(50-67) 123/67 mmHg (12/18 0444) SpO2:  [92 %-99 %] 92 % (12/18 0444) Weight:  [67.9 kg (149 lb 11.1 oz)] 149 lb 11.1 oz (67.9 kg) (12/18 0444)  Physical Exam:  Rhythm:   sinus  Breath sounds: clear  Heart sounds:  RRR  Incisions:  Clean and dry  Abdomen:  soft  Extremities:  warm   Intake/Output from previous day: 12/17 0701 - 12/18 0700 In: 580 [P.O.:580] Out: 225 [Urine:225] Intake/Output this shift:    Lab Results:  Basename 12/29/10 0540 12/27/10 0406  WBC 7.3 9.1  HGB 8.8* 8.6*  HCT 26.0* 24.8*  PLT 141* 80*   BMET:  Basename 12/29/10 0540 12/27/10 0406  NA 137 133*  K 4.0 4.0  CL 101 99  CO2 30 28  GLUCOSE 101* 142*  BUN 32* 20  CREATININE 1.80* 1.80*  CALCIUM 9.5 9.0    CBG (last 3)   Basename 12/28/10 0747 12/28/10 0403 12/27/10 2343  GLUCAP 105* 122* 125*   PT/INR:  No results found for this basename: LABPROT,INR in the last 72 hours  CXR:  Clear, looks good  Assessment/Plan: S/P Procedure(s) (LRB): CORONARY ARTERY BYPASS GRAFTING (CABG) (N/A)  Doing well POD #4 Expected post op acute blood loss anemia, stable Ready for d/c home    Dana,CLARENCE Jackson 12/29/2010 8:08 AM

## 2010-12-29 NOTE — Progress Notes (Signed)
EPWs pulled per protocol without difficulty.  All ends intact, pt tolerated procedure well.  Instructed to one hour of bedrest.  114/78, pulse 97. Will continue to monitor. Vergie Living

## 2010-12-29 NOTE — Progress Notes (Signed)
UR Completed.  Dana Jackson [id number] 12/29/2010

## 2010-12-29 NOTE — Progress Notes (Signed)
Cardiac Rehab [phone] Pt walking without difficulty. Education completed. Referring to [facility] Phase 2 with pt's permission.Dana Naves DunlapRN

## 2010-12-30 LAB — HEPARIN INDUCED THROMBOCYTOPENIA PNL
Heparin Induced Plt Ab: NEGATIVE
Patient O.D.: 0.121
UFH High Dose UFH H: 0 % Release
UFH Low Dose 0.5 IU/mL: 0 % Release

## 2010-12-30 NOTE — Progress Notes (Signed)
   CARE MANAGEMENT NOTE 12/30/2010  Patient:  Dana Jackson, Dana Jackson   Account Number:  [id number]  Date Initiated:  12/29/2010  Documentation initiated by:  Luz Lex  Subjective/Objective Assessment:   Post op CABG x3 on 12-25-10.     Action/Plan:   Anticipated DC Date:  12/31/2010   Anticipated DC Plan:  [place]  CM consult      Choice offered to / List presented to:             Status of service:  Completed, signed off Medicare Important Message given?   (If response is "NO", the following Medicare IM given date fields will be blank) Date Medicare IM given:   Date Additional Medicare IM given:    Discharge Disposition:  HOME/SELF CARE  Per UR Regulation:  Reviewed for med. necessity/level of care/duration of stay  Comments:  12/29/10 Briahnna Harries,RN,BSN PT DISCHARGED TO HOME ON 12/29/10 WITH FAMILY.  NO HOME HEALTH OR DME NEEDED. Phone [phone]   12-29-10 [place] UR completed.

## 2011-01-11 ENCOUNTER — Telehealth: Payer: Self-pay | Admitting: Thoracic Surgery (Cardiothoracic Vascular Surgery)

## 2011-01-11 NOTE — Telephone Encounter (Signed)
Ms. Overman called saying she was having "chest tightness and right shoulder pain". She had CABG x 3 on 12/14. She says this discomfort is NOT the same as she was having prior to surgery, as that was primarily shortness of breath. She is not having any sensation of SOB at present. She has had some relief with pain meds.  I advised her that it is common to have pain that migrates after surgery. If she does not get relief with meds or pain worsens or she experiences SOB she should go to ED for eval.

## 2011-01-14 ENCOUNTER — Other Ambulatory Visit: Payer: Self-pay | Admitting: Thoracic Surgery (Cardiothoracic Vascular Surgery)

## 2011-01-14 DIAGNOSIS — I2510 Atherosclerotic heart disease of native coronary artery without angina pectoris: Secondary | ICD-10-CM

## 2011-01-18 ENCOUNTER — Ambulatory Visit
Admission: RE | Admit: 2011-01-18 | Discharge: 2011-01-18 | Disposition: A | Discharge status: Home / Self Care | Payer: Medicare Other | Source: Ambulatory Visit | Attending: Thoracic Surgery (Cardiothoracic Vascular Surgery) | Admitting: Thoracic Surgery (Cardiothoracic Vascular Surgery)

## 2011-01-18 ENCOUNTER — Ambulatory Visit (INDEPENDENT_AMBULATORY_CARE_PROVIDER_SITE_OTHER): Payer: Self-pay | Admitting: Physician Assistant

## 2011-01-18 VITALS — BP 132/76 | HR 100 | Resp 20 | Ht 61.0 in | Wt 140.0 lb

## 2011-01-18 DIAGNOSIS — I2510 Atherosclerotic heart disease of native coronary artery without angina pectoris: Secondary | ICD-10-CM

## 2011-01-18 DIAGNOSIS — Z951 Presence of aortocoronary bypass graft: Secondary | ICD-10-CM

## 2011-01-18 NOTE — Progress Notes (Signed)
  HPI:  Patient returns for routine postoperative follow-up having undergone CABGx3 on 12/25/2010. The patient's early postoperative recovery while in the hospital was notable for thrombocytopenia and renal insufficiency (chronic). Since hospital discharge the patient reports she is continuing to progress well. Her only complaint is some minor swelling and tenderness distal to right EVH site. She denies shortness of breath, chest pain, fever, or chills.   Current Outpatient Prescriptions  Medication Sig Dispense Refill  . aspirin 81 MG tablet Take 81 mg by mouth daily.        . furosemide (LASIX) 40 MG tablet Take 20 mg by mouth daily.        [name] MAGNESIUM PO Take 400 mg by mouth daily.        . metoprolol tartrate (LOPRESSOR) 25 MG tablet Take 50 mg by mouth 2 (two) times daily.        . niacin (NIASPAN) 1000 MG CR tablet Take 1,000 mg by mouth at bedtime. And Takes 2000 mg every other night       . omeprazole (PRILOSEC) 40 MG capsule Take 40 mg by mouth daily.        . pravastatin (PRAVACHOL) 40 MG tablet Take 40 mg by mouth at bedtime.        Vital Signs: BP 132/76, HR 100, RR 18, O2 Sat 98% on room air.  Physical Exam: Cardiovascular: Regular rate and rhythm Pulmonary: Clear to auscultation bilaterally; no rales, wheezes, rhonchi. Abdomen: Soft, nontender, bowel sounds present. Extremities: Trace right lower extremity edema; there appears to be a small hematoma and slight bruising distal to the [facility] site. EVH site is clean dry well-healed.                     No edema the left lower extremity. Sternal wound: Sternum is solid. Wound is clean, dry, well-healed.  Diagnostic Tests: Chest x-ray done today shows resolution of the previously seen small left pleural effusion, no pneumothorax, scoliosis.  Impression and Plan: Overall, patient is continued to progress well status post CABG x3. She has already seen Dr. Ellyn Hack on 01/15/2011 and he increased her Lopressor to 50 mg by mouth 2  times daily. She has a followup appointment to see him in approximately 3 months. He is only taking Tylenol for pain. She was instructed she may begin driving short distances i.e. 30 minutes or less during the day. She may gradually increase her frequency and duration as tolerates. She is also been contacted by cardiac rehabilitation. She was instructed she is able to participate and plans on doing so next week. She was also informed her hemoglobin A1C pre op was 6 and that she should avoid sugar and followup with her medical doctor.She was instructed if he develops any redness, increase swelling, or increased tenderness of the right lower extremity, she is to contact our office immediately. Otherwise, she will return to see Dr. Roxy Manns on a PRN basis.

## 2011-01-28 ENCOUNTER — Encounter (HOSPITAL_COMMUNITY)
Admission: RE | Admit: 2011-01-28 | Discharge: 2011-01-28 | Disposition: A | Discharge status: Home / Self Care | Payer: Medicare Other | Source: Ambulatory Visit | Attending: Cardiology | Admitting: Cardiology

## 2011-01-28 ENCOUNTER — Encounter (HOSPITAL_COMMUNITY): Payer: Self-pay

## 2011-01-28 DIAGNOSIS — Z5189 Encounter for other specified aftercare: Secondary | ICD-10-CM | POA: Insufficient documentation

## 2011-01-28 DIAGNOSIS — E785 Hyperlipidemia, unspecified: Secondary | ICD-10-CM | POA: Insufficient documentation

## 2011-01-28 DIAGNOSIS — I209 Angina pectoris, unspecified: Secondary | ICD-10-CM | POA: Insufficient documentation

## 2011-01-28 DIAGNOSIS — Z8673 Personal history of transient ischemic attack (TIA), and cerebral infarction without residual deficits: Secondary | ICD-10-CM | POA: Insufficient documentation

## 2011-01-28 DIAGNOSIS — I129 Hypertensive chronic kidney disease with stage 1 through stage 4 chronic kidney disease, or unspecified chronic kidney disease: Secondary | ICD-10-CM | POA: Insufficient documentation

## 2011-01-28 DIAGNOSIS — Z951 Presence of aortocoronary bypass graft: Secondary | ICD-10-CM | POA: Insufficient documentation

## 2011-01-28 DIAGNOSIS — Z85528 Personal history of other malignant neoplasm of kidney: Secondary | ICD-10-CM | POA: Insufficient documentation

## 2011-01-28 DIAGNOSIS — I2510 Atherosclerotic heart disease of native coronary artery without angina pectoris: Secondary | ICD-10-CM | POA: Insufficient documentation

## 2011-01-28 DIAGNOSIS — N183 Chronic kidney disease, stage 3 unspecified: Secondary | ICD-10-CM | POA: Insufficient documentation

## 2011-02-01 ENCOUNTER — Encounter (HOSPITAL_COMMUNITY)
Admission: RE | Admit: 2011-02-01 | Discharge: 2011-02-01 | Disposition: A | Discharge status: Home / Self Care | Payer: Medicare Other | Source: Ambulatory Visit | Attending: Cardiology | Admitting: Cardiology

## 2011-02-01 LAB — GLUCOSE, CAPILLARY: Glucose-Capillary: 160 mg/dL — ABNORMAL HIGH (ref 70–99)

## 2011-02-01 NOTE — Progress Notes (Signed)
Pt started cardiac rehab today.  Pt tolerated light exercise without difficulty, vss. Telemetry rhythm sinus with t wave inversion. T wave inversion present on office 12 lead form Dr Allison Quarry office. Will continue to monitor the patient throughout  the program. CBG 160.

## 2011-02-03 ENCOUNTER — Encounter (HOSPITAL_COMMUNITY)
Admission: RE | Admit: 2011-02-03 | Discharge: 2011-02-03 | Disposition: A | Discharge status: Home / Self Care | Payer: Medicare Other | Source: Ambulatory Visit | Attending: Cardiology | Admitting: Cardiology

## 2011-02-03 LAB — GLUCOSE, CAPILLARY: Glucose-Capillary: 97 mg/dL (ref 70–99)

## 2011-02-05 ENCOUNTER — Encounter (HOSPITAL_COMMUNITY)
Admission: RE | Admit: 2011-02-05 | Discharge: 2011-02-05 | Disposition: A | Discharge status: Home / Self Care | Payer: Medicare Other | Source: Ambulatory Visit | Attending: Cardiology | Admitting: Cardiology

## 2011-02-05 LAB — GLUCOSE, CAPILLARY: Glucose-Capillary: 159 mg/dL — ABNORMAL HIGH (ref 70–99)

## 2011-02-05 NOTE — Progress Notes (Signed)
Chrsitina Jackson 66 y.o. female       Nutrition Screen                                                                    YES  NO Do you live in a nursing home?  X   Do you eat out more than 3 times/week?    X If yes, how many times per week do you eat out?  Do you have food allergies?   X If yes, what are you allergic to?  Have you gained or lost more than 10 lbs without trying?               X If yes, how much weight have you lost and over what time period?  lbs gained or lost over  weeks/month  Do you want to lose weight?    X  If yes, what is a goal weight or amount of weight you would like to lose? 5-10 LBS  Do you eat alone most of the time?   X   Do you eat less than 2 meals/day?  X If yes, how many meals do you eat?  Do you drink more than 3 alcohol drinks/day?  X If yes, how many drinks per day?  Are you having trouble with constipation? *  X If yes, what are you doing to help relieve constipation?  Do you have financial difficulties with buying food?*    X   Are you experiencing regular nausea/ vomiting?*     X   Not regular  Do you have a poor appetite? *                                        X   Do you have trouble chewing/swallowing? *   X    Pt with diagnoses of:  X CABG              X Dyslipidemia  / HDL< 45 / LDL>70 / High TG      X %  Body fat >goal / Body Mass Index >25 X HTN / BP >120/80 X Pre-diabetes       Pt Risk Score   1       Diagnosis Risk Score  25       Total Risk Score   26                         High Risk               X Low Risk              HT: 60.1" Ht Readings from Last 1 Encounters:  01/28/11 5' (1.524 m)    WT:   144.3 lb (65.6 kg) Wt Readings from Last 3 Encounters:  01/28/11 144 lb 10 oz (65.6 kg)  01/18/11 140 lb (63.504 kg)  12/29/10 149 lb 11.1 oz (67.9 kg)     IBW 45.7 144%IBW BMI 28.2 41.5%body fat   Meds reviewed: Lasix, magnesium, Niaspan ER  Past Medical History  Diagnosis Date  . Hypertension   .  Renal insufficiency     . Hyperlipidemia   . Degenerative disc disease, cervical 12/24/10    "they are not going to operate; they told me to handle it with RXs"  . Renal cell carcinoma 11/18/2008    T2aNx s/p partial left nephrectomy  . TIA (transient ischemic attack) 1992 &  2010  . Coronary artery disease   . Angina   . Shortness of breath on exertion   . Shortness of breath 12/24/10    "recently w/lying down and at rest sometimes; not real often"  . Asthma     "when I was a child"  . Pneumonia     "2-3 times"  . GERD (gastroesophageal reflux disease)   . Stroke   . Arthritis         Activity level: Pt is moderately active  Wt goal: 120-134 lb ( 54.5-60 kg) Current tobacco use? No      Food/Drug Interaction? No      Labs:  Lipid Panel     Component Value Date/Time   CHOL 148 12/24/2010 0645   TRIG 58 12/24/2010 0645   HDL 59 12/24/2010 0645   CHOLHDL 2.5 12/24/2010 0645   VLDL 12 12/24/2010 0645   LDLCALC 77 12/24/2010 0645   Lab Results  Component Value Date   HGBA1C 6.0* 12/25/2010   12/29/10 Glucose 101   LDL goal:< 70  Carotid and > 2:  HTN, family h/o,>67 yo female   Estimated Daily Nutrition Needs for: ? wt loss  1200 Kcal , Total Fat 30gm, Saturated Fat 9 gm, Trans Fat 1.2 gm,  Sodium less than 1500 mg  Comment: Spoke with pt per Maurice Small, RN request.  Pt pre-exercise CBG 221 mg/dL.  Pt is aware that she is pre-diabetic.  Per pt, she has a family h/o diabetes and her husband has diabetes.  Pt has been following the BRAT diet because she has had a stomach virus.  Pt states she ate regular Special K, skim milk, and a banana this morning.  Pt encouraged to resume her heart healthy diet with plenty of whole grains, fresh fruit and vegetables once she felt better. Pre-diabetic diet and avoiding processed grains discussed.  Pt has lost 20 lbs on Weight Watchers and plans to resume Weight Watchers program to lose "5-10 more lbs."  Pt to have A1c re-drawn in "about a month."  Pt  expressed understanding of the above information reviewed.  Pt given Pre-diabetes Q and A handout. Continue client-centered nutrition education by RD as part of interdisciplinary care.  Monitor and evaluate progress toward nutrition goal with team.

## 2011-02-08 ENCOUNTER — Encounter (HOSPITAL_COMMUNITY): Payer: Medicare Other

## 2011-02-10 ENCOUNTER — Encounter (HOSPITAL_COMMUNITY)
Admission: RE | Admit: 2011-02-10 | Discharge: 2011-02-10 | Disposition: A | Discharge status: Home / Self Care | Payer: Medicare Other | Source: Ambulatory Visit | Attending: Cardiology | Admitting: Cardiology

## 2011-02-10 LAB — GLUCOSE, CAPILLARY: Glucose-Capillary: 160 mg/dL — ABNORMAL HIGH (ref 70–99)

## 2011-02-10 NOTE — Progress Notes (Signed)
Dana Jackson, "Jean's blood sugars have ranged from 97 to 221.  Will send exercise flow sheets with CBG's from cardiac rehab for Dr Marlyn Corporal to review.  [name] is not on any medication for diabetes at this time.

## 2011-02-12 ENCOUNTER — Encounter (HOSPITAL_COMMUNITY)
Admission: RE | Admit: 2011-02-12 | Discharge: 2011-02-12 | Disposition: A | Discharge status: Home / Self Care | Payer: Medicare Other | Source: Ambulatory Visit | Attending: Cardiology | Admitting: Cardiology

## 2011-02-12 DIAGNOSIS — Z8673 Personal history of transient ischemic attack (TIA), and cerebral infarction without residual deficits: Secondary | ICD-10-CM | POA: Insufficient documentation

## 2011-02-12 DIAGNOSIS — Z951 Presence of aortocoronary bypass graft: Secondary | ICD-10-CM | POA: Insufficient documentation

## 2011-02-12 DIAGNOSIS — E785 Hyperlipidemia, unspecified: Secondary | ICD-10-CM | POA: Insufficient documentation

## 2011-02-12 DIAGNOSIS — Z5189 Encounter for other specified aftercare: Secondary | ICD-10-CM | POA: Insufficient documentation

## 2011-02-12 DIAGNOSIS — I209 Angina pectoris, unspecified: Secondary | ICD-10-CM | POA: Insufficient documentation

## 2011-02-12 DIAGNOSIS — I2510 Atherosclerotic heart disease of native coronary artery without angina pectoris: Secondary | ICD-10-CM | POA: Insufficient documentation

## 2011-02-12 DIAGNOSIS — I129 Hypertensive chronic kidney disease with stage 1 through stage 4 chronic kidney disease, or unspecified chronic kidney disease: Secondary | ICD-10-CM | POA: Insufficient documentation

## 2011-02-12 DIAGNOSIS — Z85528 Personal history of other malignant neoplasm of kidney: Secondary | ICD-10-CM | POA: Insufficient documentation

## 2011-02-12 DIAGNOSIS — N183 Chronic kidney disease, stage 3 unspecified: Secondary | ICD-10-CM | POA: Insufficient documentation

## 2011-02-15 ENCOUNTER — Encounter (HOSPITAL_COMMUNITY)
Admission: RE | Admit: 2011-02-15 | Discharge: 2011-02-15 | Disposition: A | Discharge status: Home / Self Care | Payer: Medicare Other | Source: Ambulatory Visit | Attending: Cardiology | Admitting: Cardiology

## 2011-02-15 NOTE — Progress Notes (Signed)
Reviewed home exercise with pt today.  Pt plans to walk and use [employer] at home for exercise.  Reviewed THR, pulse (needs practice), RPE, sign and symptoms, NTG use, and when to call 911 or MD.  Pt voiced understanding. Alberteen Sam, MA, ACSM RCEP

## 2011-02-15 NOTE — Progress Notes (Signed)
Dana Jackson 66 y.o. female Nutrition Note Spoke with pt.  Nutrition Plan and Nutrition Survey reviewed with pt.  Pt is following a step 2 heart healthy diet. Weight loss tips discussed.  Pt plans on starting back on her Weight Watchers program this week.  Pt reports she manages to lose wt better "if I write down what I eat."  Pre-diabetes discussed again.  Pt expressed understanding.  Nutrition Diagnosis   Food-and nutrition-related knowledge deficit related to lack of exposure to information as related to diagnosis of: ? CVD ? Pre-DM (A1c 6.0)   Overweight related to excessive energy intake as evidenced by a BMI of 28.2  Estimated Daily Nutrition Needs for: ? wt loss  1200 Kcal , Total Fat 30gm, Saturated Fat 9 gm, Trans Fat 1.2 gm, Sodium less than 1500 mg Nutrition Intervention   Pt's individual nutrition plan including cholesterol goals reviewed with pt.   Benefits of adopting Therapeutic Lifestyle Changes discussed when Medficts reviewed.   Pt to attend the Portion Distortion class   Pt to attend the  ? Nutrition I class                         ? Nutrition II class    Pt given handouts for: ? wt loss ? pre-DM    Continue client-centered nutrition education by RD, as part of interdisciplinary care. Goal(s)   Pt to identify food quantities necessary to achieve: ? wt loss to a goal wt of 120-132# (54.5-60 kg) at graduation from cardiac rehab.  Monitor and Evaluate progress toward nutrition goal with team.

## 2011-02-17 ENCOUNTER — Encounter (HOSPITAL_COMMUNITY)
Admission: RE | Admit: 2011-02-17 | Discharge: 2011-02-17 | Disposition: A | Discharge status: Home / Self Care | Payer: Medicare Other | Source: Ambulatory Visit | Attending: Cardiology | Admitting: Cardiology

## 2011-02-17 ENCOUNTER — Encounter (HOSPITAL_COMMUNITY): Payer: Medicare Other

## 2011-02-19 ENCOUNTER — Encounter (HOSPITAL_COMMUNITY)
Admission: RE | Admit: 2011-02-19 | Discharge: 2011-02-19 | Disposition: A | Discharge status: Home / Self Care | Payer: Medicare Other | Source: Ambulatory Visit | Attending: Cardiology | Admitting: Cardiology

## 2011-02-19 ENCOUNTER — Encounter (HOSPITAL_COMMUNITY): Payer: Medicare Other

## 2011-02-22 ENCOUNTER — Encounter (HOSPITAL_COMMUNITY)
Admission: RE | Admit: 2011-02-22 | Discharge: 2011-02-22 | Disposition: A | Discharge status: Home / Self Care | Payer: Medicare Other | Source: Ambulatory Visit | Attending: Cardiology | Admitting: Cardiology

## 2011-02-22 ENCOUNTER — Encounter (HOSPITAL_COMMUNITY): Payer: Medicare Other

## 2011-02-24 ENCOUNTER — Encounter (HOSPITAL_COMMUNITY): Payer: Medicare Other

## 2011-02-26 ENCOUNTER — Encounter (HOSPITAL_COMMUNITY)
Admission: RE | Admit: 2011-02-26 | Discharge: 2011-02-26 | Disposition: A | Discharge status: Home / Self Care | Payer: Medicare Other | Source: Ambulatory Visit | Attending: Cardiology | Admitting: Cardiology

## 2011-02-26 ENCOUNTER — Encounter (HOSPITAL_COMMUNITY): Payer: Medicare Other

## 2011-03-01 ENCOUNTER — Encounter (HOSPITAL_COMMUNITY): Payer: Medicare Other

## 2011-03-03 ENCOUNTER — Encounter (HOSPITAL_COMMUNITY): Payer: Medicare Other

## 2011-03-03 ENCOUNTER — Encounter (HOSPITAL_COMMUNITY)
Admission: RE | Admit: 2011-03-03 | Discharge: 2011-03-03 | Disposition: A | Discharge status: Home / Self Care | Payer: Medicare Other | Source: Ambulatory Visit | Attending: Cardiology | Admitting: Cardiology

## 2011-03-05 ENCOUNTER — Encounter (HOSPITAL_COMMUNITY)
Admission: RE | Admit: 2011-03-05 | Discharge: 2011-03-05 | Disposition: A | Discharge status: Home / Self Care | Payer: Medicare Other | Source: Ambulatory Visit | Attending: Cardiology | Admitting: Cardiology

## 2011-03-05 ENCOUNTER — Encounter (HOSPITAL_COMMUNITY): Payer: Medicare Other

## 2011-03-08 ENCOUNTER — Encounter (HOSPITAL_COMMUNITY): Payer: Medicare Other

## 2011-03-08 ENCOUNTER — Encounter (HOSPITAL_COMMUNITY)
Admission: RE | Admit: 2011-03-08 | Discharge: 2011-03-08 | Disposition: A | Discharge status: Home / Self Care | Payer: Medicare Other | Source: Ambulatory Visit | Attending: Cardiology | Admitting: Cardiology

## 2011-03-10 ENCOUNTER — Encounter (HOSPITAL_COMMUNITY)
Admission: RE | Admit: 2011-03-10 | Discharge: 2011-03-10 | Disposition: A | Discharge status: Home / Self Care | Payer: Medicare Other | Source: Ambulatory Visit | Attending: Cardiology | Admitting: Cardiology

## 2011-03-10 ENCOUNTER — Encounter (HOSPITAL_COMMUNITY): Payer: Medicare Other

## 2011-03-12 ENCOUNTER — Encounter (HOSPITAL_COMMUNITY)
Admission: RE | Admit: 2011-03-12 | Discharge: 2011-03-12 | Disposition: A | Discharge status: Home / Self Care | Payer: Medicare Other | Source: Ambulatory Visit | Attending: Cardiology | Admitting: Cardiology

## 2011-03-12 ENCOUNTER — Encounter (HOSPITAL_COMMUNITY): Payer: Medicare Other

## 2011-03-12 DIAGNOSIS — I129 Hypertensive chronic kidney disease with stage 1 through stage 4 chronic kidney disease, or unspecified chronic kidney disease: Secondary | ICD-10-CM | POA: Insufficient documentation

## 2011-03-12 DIAGNOSIS — I209 Angina pectoris, unspecified: Secondary | ICD-10-CM | POA: Insufficient documentation

## 2011-03-12 DIAGNOSIS — Z85528 Personal history of other malignant neoplasm of kidney: Secondary | ICD-10-CM | POA: Insufficient documentation

## 2011-03-12 DIAGNOSIS — N183 Chronic kidney disease, stage 3 unspecified: Secondary | ICD-10-CM | POA: Insufficient documentation

## 2011-03-12 DIAGNOSIS — Z951 Presence of aortocoronary bypass graft: Secondary | ICD-10-CM | POA: Insufficient documentation

## 2011-03-12 DIAGNOSIS — Z5189 Encounter for other specified aftercare: Secondary | ICD-10-CM | POA: Insufficient documentation

## 2011-03-12 DIAGNOSIS — E785 Hyperlipidemia, unspecified: Secondary | ICD-10-CM | POA: Insufficient documentation

## 2011-03-12 DIAGNOSIS — Z8673 Personal history of transient ischemic attack (TIA), and cerebral infarction without residual deficits: Secondary | ICD-10-CM | POA: Insufficient documentation

## 2011-03-12 DIAGNOSIS — I2510 Atherosclerotic heart disease of native coronary artery without angina pectoris: Secondary | ICD-10-CM | POA: Insufficient documentation

## 2011-03-15 ENCOUNTER — Encounter (HOSPITAL_COMMUNITY): Payer: Medicare Other

## 2011-03-15 ENCOUNTER — Encounter (HOSPITAL_COMMUNITY)
Admission: RE | Admit: 2011-03-15 | Discharge: 2011-03-15 | Disposition: A | Discharge status: Home / Self Care | Payer: Medicare Other | Source: Ambulatory Visit | Attending: Cardiology | Admitting: Cardiology

## 2011-03-17 ENCOUNTER — Encounter (HOSPITAL_COMMUNITY)
Admission: RE | Admit: 2011-03-17 | Discharge: 2011-03-17 | Disposition: A | Discharge status: Home / Self Care | Payer: Medicare Other | Source: Ambulatory Visit | Attending: Cardiology | Admitting: Cardiology

## 2011-03-17 ENCOUNTER — Encounter (HOSPITAL_COMMUNITY): Payer: Medicare Other

## 2011-03-19 ENCOUNTER — Encounter (HOSPITAL_COMMUNITY)
Admission: RE | Admit: 2011-03-19 | Discharge: 2011-03-19 | Disposition: A | Discharge status: Home / Self Care | Payer: Medicare Other | Source: Ambulatory Visit | Attending: Cardiology | Admitting: Cardiology

## 2011-03-19 ENCOUNTER — Encounter (HOSPITAL_COMMUNITY): Payer: Medicare Other

## 2011-03-22 ENCOUNTER — Encounter (HOSPITAL_COMMUNITY)
Admission: RE | Admit: 2011-03-22 | Discharge: 2011-03-22 | Disposition: A | Discharge status: Home / Self Care | Payer: Medicare Other | Source: Ambulatory Visit | Attending: Cardiology | Admitting: Cardiology

## 2011-03-22 ENCOUNTER — Encounter (HOSPITAL_COMMUNITY): Payer: Medicare Other

## 2011-03-24 ENCOUNTER — Encounter (HOSPITAL_COMMUNITY)
Admission: RE | Admit: 2011-03-24 | Discharge: 2011-03-24 | Disposition: A | Discharge status: Home / Self Care | Payer: Medicare Other | Source: Ambulatory Visit | Attending: Cardiology | Admitting: Cardiology

## 2011-03-24 ENCOUNTER — Encounter (HOSPITAL_COMMUNITY): Payer: Medicare Other

## 2011-03-26 ENCOUNTER — Encounter (HOSPITAL_COMMUNITY)
Admission: RE | Admit: 2011-03-26 | Discharge: 2011-03-26 | Disposition: A | Discharge status: Home / Self Care | Payer: Medicare Other | Source: Ambulatory Visit | Attending: Cardiology | Admitting: Cardiology

## 2011-03-26 ENCOUNTER — Encounter (HOSPITAL_COMMUNITY): Payer: Medicare Other

## 2011-03-29 ENCOUNTER — Encounter (HOSPITAL_COMMUNITY): Payer: Medicare Other

## 2011-03-29 ENCOUNTER — Encounter (HOSPITAL_COMMUNITY)
Admission: RE | Admit: 2011-03-29 | Discharge: 2011-03-29 | Disposition: A | Discharge status: Home / Self Care | Payer: Medicare Other | Source: Ambulatory Visit | Attending: Cardiology | Admitting: Cardiology

## 2011-03-31 ENCOUNTER — Encounter (HOSPITAL_COMMUNITY): Payer: Medicare Other

## 2011-04-02 ENCOUNTER — Encounter (HOSPITAL_COMMUNITY)
Admission: RE | Admit: 2011-04-02 | Discharge: 2011-04-02 | Disposition: A | Discharge status: Home / Self Care | Payer: Medicare Other | Source: Ambulatory Visit | Attending: Cardiology | Admitting: Cardiology

## 2011-04-02 ENCOUNTER — Encounter (HOSPITAL_COMMUNITY): Payer: Medicare Other

## 2011-04-05 ENCOUNTER — Encounter (HOSPITAL_COMMUNITY)
Admission: RE | Admit: 2011-04-05 | Discharge: 2011-04-05 | Disposition: A | Discharge status: Home / Self Care | Payer: Medicare Other | Source: Ambulatory Visit | Attending: Cardiology | Admitting: Cardiology

## 2011-04-05 ENCOUNTER — Encounter (HOSPITAL_COMMUNITY): Payer: Medicare Other

## 2011-04-07 ENCOUNTER — Encounter (HOSPITAL_COMMUNITY)
Admission: RE | Admit: 2011-04-07 | Discharge: 2011-04-07 | Disposition: A | Discharge status: Home / Self Care | Payer: Medicare Other | Source: Ambulatory Visit | Attending: Cardiology | Admitting: Cardiology

## 2011-04-07 ENCOUNTER — Encounter (HOSPITAL_COMMUNITY): Payer: Medicare Other

## 2011-04-09 ENCOUNTER — Encounter (HOSPITAL_COMMUNITY)
Admission: RE | Admit: 2011-04-09 | Discharge: 2011-04-09 | Disposition: A | Discharge status: Home / Self Care | Payer: Medicare Other | Source: Ambulatory Visit | Attending: Cardiology | Admitting: Cardiology

## 2011-04-09 ENCOUNTER — Encounter (HOSPITAL_COMMUNITY): Payer: Medicare Other

## 2011-04-12 ENCOUNTER — Encounter (HOSPITAL_COMMUNITY): Payer: Medicare Other

## 2011-04-12 ENCOUNTER — Encounter (HOSPITAL_COMMUNITY)
Admission: RE | Admit: 2011-04-12 | Discharge: 2011-04-12 | Disposition: A | Discharge status: Home / Self Care | Payer: Medicare Other | Source: Ambulatory Visit | Attending: Cardiology | Admitting: Cardiology

## 2011-04-12 DIAGNOSIS — Z8673 Personal history of transient ischemic attack (TIA), and cerebral infarction without residual deficits: Secondary | ICD-10-CM | POA: Insufficient documentation

## 2011-04-12 DIAGNOSIS — Z85528 Personal history of other malignant neoplasm of kidney: Secondary | ICD-10-CM | POA: Insufficient documentation

## 2011-04-12 DIAGNOSIS — I2510 Atherosclerotic heart disease of native coronary artery without angina pectoris: Secondary | ICD-10-CM | POA: Insufficient documentation

## 2011-04-12 DIAGNOSIS — I209 Angina pectoris, unspecified: Secondary | ICD-10-CM | POA: Insufficient documentation

## 2011-04-12 DIAGNOSIS — Z951 Presence of aortocoronary bypass graft: Secondary | ICD-10-CM | POA: Insufficient documentation

## 2011-04-12 DIAGNOSIS — Z5189 Encounter for other specified aftercare: Secondary | ICD-10-CM | POA: Insufficient documentation

## 2011-04-12 DIAGNOSIS — E785 Hyperlipidemia, unspecified: Secondary | ICD-10-CM | POA: Insufficient documentation

## 2011-04-12 DIAGNOSIS — I129 Hypertensive chronic kidney disease with stage 1 through stage 4 chronic kidney disease, or unspecified chronic kidney disease: Secondary | ICD-10-CM | POA: Insufficient documentation

## 2011-04-12 DIAGNOSIS — N183 Chronic kidney disease, stage 3 unspecified: Secondary | ICD-10-CM | POA: Insufficient documentation

## 2011-04-14 ENCOUNTER — Encounter (HOSPITAL_COMMUNITY)
Admission: RE | Admit: 2011-04-14 | Discharge: 2011-04-14 | Disposition: A | Discharge status: Home / Self Care | Payer: Medicare Other | Source: Ambulatory Visit | Attending: Cardiology | Admitting: Cardiology

## 2011-04-14 ENCOUNTER — Encounter (HOSPITAL_COMMUNITY): Payer: Medicare Other

## 2011-04-16 ENCOUNTER — Encounter (HOSPITAL_COMMUNITY): Payer: Medicare Other

## 2011-04-19 ENCOUNTER — Encounter (HOSPITAL_COMMUNITY)
Admission: RE | Admit: 2011-04-19 | Discharge: 2011-04-19 | Disposition: A | Discharge status: Home / Self Care | Payer: Medicare Other | Source: Ambulatory Visit | Attending: Cardiology | Admitting: Cardiology

## 2011-04-19 ENCOUNTER — Encounter (HOSPITAL_COMMUNITY): Payer: Medicare Other

## 2011-04-21 ENCOUNTER — Encounter (HOSPITAL_COMMUNITY): Payer: Medicare Other

## 2011-04-21 ENCOUNTER — Encounter (HOSPITAL_COMMUNITY)
Admission: RE | Admit: 2011-04-21 | Discharge: 2011-04-21 | Disposition: A | Discharge status: Home / Self Care | Payer: Medicare Other | Source: Ambulatory Visit | Attending: Cardiology | Admitting: Cardiology

## 2011-04-23 ENCOUNTER — Encounter (HOSPITAL_COMMUNITY)
Admission: RE | Admit: 2011-04-23 | Discharge: 2011-04-23 | Disposition: A | Discharge status: Home / Self Care | Payer: Medicare Other | Source: Ambulatory Visit | Attending: Cardiology | Admitting: Cardiology

## 2011-04-23 ENCOUNTER — Encounter (HOSPITAL_COMMUNITY): Payer: Medicare Other

## 2011-04-26 ENCOUNTER — Encounter (HOSPITAL_COMMUNITY)
Admission: RE | Admit: 2011-04-26 | Discharge: 2011-04-26 | Disposition: A | Discharge status: Home / Self Care | Payer: Medicare Other | Source: Ambulatory Visit | Attending: Cardiology | Admitting: Cardiology

## 2011-04-26 ENCOUNTER — Encounter (HOSPITAL_COMMUNITY): Payer: Medicare Other

## 2011-04-28 ENCOUNTER — Encounter (HOSPITAL_COMMUNITY): Payer: Medicare Other

## 2011-04-28 ENCOUNTER — Encounter (HOSPITAL_COMMUNITY)
Admission: RE | Admit: 2011-04-28 | Discharge: 2011-04-28 | Disposition: A | Discharge status: Home / Self Care | Payer: Medicare Other | Source: Ambulatory Visit | Attending: Cardiology | Admitting: Cardiology

## 2011-04-29 NOTE — Progress Notes (Signed)
Dana Jackson did not take her metoprolol yesterday she plans to get her medication refilled after class. Dana Jackson did exceed her target heart rate with exercise today. Will continue to monitor the patient.

## 2011-04-30 ENCOUNTER — Encounter (HOSPITAL_COMMUNITY)
Admission: RE | Admit: 2011-04-30 | Discharge: 2011-04-30 | Disposition: A | Discharge status: Home / Self Care | Payer: Medicare Other | Source: Ambulatory Visit | Attending: Cardiology | Admitting: Cardiology

## 2011-04-30 ENCOUNTER — Encounter (HOSPITAL_COMMUNITY): Payer: Medicare Other

## 2011-04-30 NOTE — Progress Notes (Signed)
Dana Jackson is taking her betablocker again.  No problems with exercise today.

## 2011-05-03 ENCOUNTER — Encounter (HOSPITAL_COMMUNITY): Payer: Medicare Other

## 2011-05-03 ENCOUNTER — Encounter (HOSPITAL_COMMUNITY)
Admission: RE | Admit: 2011-05-03 | Discharge: 2011-05-03 | Disposition: A | Discharge status: Home / Self Care | Payer: Medicare Other | Source: Ambulatory Visit | Attending: Cardiology | Admitting: Cardiology

## 2011-05-05 ENCOUNTER — Encounter (HOSPITAL_COMMUNITY)
Admission: RE | Admit: 2011-05-05 | Discharge: 2011-05-05 | Disposition: A | Discharge status: Home / Self Care | Payer: Medicare Other | Source: Ambulatory Visit | Attending: Cardiology | Admitting: Cardiology

## 2011-05-05 ENCOUNTER — Encounter (HOSPITAL_COMMUNITY): Payer: Medicare Other

## 2011-05-06 NOTE — Progress Notes (Signed)
Dana Jackson graduates today. Dana Jackson plans to continue exercise on her own

## 2011-05-07 ENCOUNTER — Encounter (HOSPITAL_COMMUNITY): Payer: Medicare Other

## 2011-11-17 ENCOUNTER — Other Ambulatory Visit (HOSPITAL_COMMUNITY): Payer: Self-pay | Admitting: Urology

## 2011-11-17 DIAGNOSIS — IMO0001 Reserved for inherently not codable concepts without codable children: Secondary | ICD-10-CM

## 2012-01-27 ENCOUNTER — Ambulatory Visit (HOSPITAL_COMMUNITY)
Admission: RE | Admit: 2012-01-27 | Discharge: 2012-01-27 | Disposition: A | Discharge status: Home / Self Care | Payer: PRIVATE HEALTH INSURANCE | Source: Ambulatory Visit | Attending: Urology | Admitting: Urology

## 2012-01-27 DIAGNOSIS — Z940 Kidney transplant status: Secondary | ICD-10-CM | POA: Insufficient documentation

## 2012-01-27 DIAGNOSIS — IMO0001 Reserved for inherently not codable concepts without codable children: Secondary | ICD-10-CM

## 2012-01-27 DIAGNOSIS — N281 Cyst of kidney, acquired: Secondary | ICD-10-CM | POA: Insufficient documentation

## 2012-01-27 DIAGNOSIS — Z9089 Acquired absence of other organs: Secondary | ICD-10-CM | POA: Insufficient documentation

## 2012-01-27 DIAGNOSIS — N289 Disorder of kidney and ureter, unspecified: Secondary | ICD-10-CM | POA: Insufficient documentation

## 2012-01-27 DIAGNOSIS — C649 Malignant neoplasm of unspecified kidney, except renal pelvis: Secondary | ICD-10-CM | POA: Insufficient documentation

## 2012-03-06 ENCOUNTER — Other Ambulatory Visit (HOSPITAL_COMMUNITY): Payer: Self-pay | Admitting: Urology

## 2012-03-07 ENCOUNTER — Other Ambulatory Visit: Payer: Self-pay | Admitting: Radiology

## 2012-03-07 ENCOUNTER — Other Ambulatory Visit (HOSPITAL_COMMUNITY): Payer: Self-pay | Admitting: Urology

## 2012-03-07 DIAGNOSIS — N2889 Other specified disorders of kidney and ureter: Secondary | ICD-10-CM

## 2012-03-08 ENCOUNTER — Encounter (HOSPITAL_COMMUNITY): Payer: Self-pay | Admitting: Pharmacy Technician

## 2012-03-09 ENCOUNTER — Ambulatory Visit (HOSPITAL_COMMUNITY)
Admission: RE | Admit: 2012-03-09 | Discharge: 2012-03-09 | Disposition: A | Discharge status: Home / Self Care | Payer: PRIVATE HEALTH INSURANCE | Source: Ambulatory Visit | Attending: Urology | Admitting: Urology

## 2012-03-09 ENCOUNTER — Encounter (HOSPITAL_COMMUNITY): Payer: Self-pay

## 2012-03-09 DIAGNOSIS — I129 Hypertensive chronic kidney disease with stage 1 through stage 4 chronic kidney disease, or unspecified chronic kidney disease: Secondary | ICD-10-CM | POA: Insufficient documentation

## 2012-03-09 DIAGNOSIS — Z8673 Personal history of transient ischemic attack (TIA), and cerebral infarction without residual deficits: Secondary | ICD-10-CM | POA: Insufficient documentation

## 2012-03-09 DIAGNOSIS — Z905 Acquired absence of kidney: Secondary | ICD-10-CM | POA: Insufficient documentation

## 2012-03-09 DIAGNOSIS — N289 Disorder of kidney and ureter, unspecified: Secondary | ICD-10-CM | POA: Insufficient documentation

## 2012-03-09 DIAGNOSIS — K219 Gastro-esophageal reflux disease without esophagitis: Secondary | ICD-10-CM | POA: Insufficient documentation

## 2012-03-09 DIAGNOSIS — I10 Essential (primary) hypertension: Secondary | ICD-10-CM | POA: Insufficient documentation

## 2012-03-09 DIAGNOSIS — N269 Renal sclerosis, unspecified: Secondary | ICD-10-CM | POA: Insufficient documentation

## 2012-03-09 DIAGNOSIS — E785 Hyperlipidemia, unspecified: Secondary | ICD-10-CM | POA: Insufficient documentation

## 2012-03-09 DIAGNOSIS — N2889 Other specified disorders of kidney and ureter: Secondary | ICD-10-CM

## 2012-03-09 DIAGNOSIS — Z85528 Personal history of other malignant neoplasm of kidney: Secondary | ICD-10-CM | POA: Insufficient documentation

## 2012-03-09 LAB — CBC
HCT: 38 % (ref 36.0–46.0)
MCV: 90.3 fL (ref 78.0–100.0)
Platelets: 239 10*3/uL (ref 150–400)
RBC: 4.21 MIL/uL (ref 3.87–5.11)
RDW: 11.8 % (ref 11.5–15.5)
WBC: 12.1 10*3/uL — ABNORMAL HIGH (ref 4.0–10.5)

## 2012-03-09 LAB — APTT: aPTT: 32 seconds (ref 24–37)

## 2012-03-09 LAB — PROTIME-INR: INR: 0.96 (ref 0.00–1.49)

## 2012-03-09 MED ORDER — ACETAMINOPHEN 500 MG PO TABS: 500.0000 mg | ORAL_TABLET | Freq: Four times a day (QID) | ORAL | Status: DC | PRN

## 2012-03-09 MED ORDER — FENTANYL CITRATE 0.05 MG/ML IJ SOLN
INTRAMUSCULAR | Status: AC
  Filled 2012-03-09: qty 4

## 2012-03-09 MED ORDER — MIDAZOLAM HCL 2 MG/2ML IJ SOLN
INTRAMUSCULAR | Status: AC
  Filled 2012-03-09: qty 4

## 2012-03-09 MED ORDER — SODIUM CHLORIDE 0.9 % IV SOLN
Freq: Once | INTRAVENOUS | Status: AC
  Administered 2012-03-09: 10:00:00 via INTRAVENOUS

## 2012-03-09 MED ORDER — MIDAZOLAM HCL 2 MG/2ML IJ SOLN
INTRAMUSCULAR | Status: AC | PRN
  Administered 2012-03-09: 1 mg via INTRAVENOUS
  Administered 2012-03-09: 2 mg via INTRAVENOUS

## 2012-03-09 MED ORDER — FENTANYL CITRATE 0.05 MG/ML IJ SOLN
INTRAMUSCULAR | Status: AC | PRN
  Administered 2012-03-09 (×3): 50 ug via INTRAVENOUS

## 2012-03-09 NOTE — Procedures (Signed)
US guided biopsy of left renal lesion (lower pole).  4 cores performed, no immediate complication.

## 2012-03-09 NOTE — H&P (Signed)
Dana Jackson is an 67 y.o. female.   Chief Complaint: left renal mass Pt with hx left partial nephrectomy 2010; + renal cell ca Routine f/u reveals new renal lesion Scheduled now for left renal mass biopsy HPI: HTN; HLD; Renal Ca; TIA; CAD/CABG; CVA  Past Medical History  Diagnosis Date  . Hypertension   . Renal insufficiency   . Hyperlipidemia   . Degenerative disc disease, cervical 12/24/10    "they are not going to operate; they told me to handle it with RXs"  . Renal cell carcinoma 11/18/2008    T2aNx s/p partial left nephrectomy  . TIA (transient ischemic attack) 1992 &  2010  . Coronary artery disease   . Angina   . Shortness of breath on exertion   . Shortness of breath 12/24/10    "recently w/lying down and at rest sometimes; not real often"  . Asthma     "when I was a child"  . Pneumonia     "2-3 times"  . GERD (gastroesophageal reflux disease)   . Stroke   . Arthritis     Past Surgical History  Procedure Laterality Date  . Partial nephrectomy  11/18/2008    left partial nephrectomy for renal cell CA  . Anterior cervical decomp/discectomy fusion  10/11/2005    multi-level  . Cardiac catheterization  12/24/10  . Tonsillectomy      "in college"  . Cholecystectomy  ~ 1971  . Tubal ligation  ~ 1984  . Coronary artery bypass graft  12/25/2010    Procedure: CORONARY ARTERY BYPASS GRAFTING (CABG);  Surgeon: Rexene Alberts, MD;  Location: [facility];  Service: Open Heart Surgery;  Laterality: N/A;    Family History  Problem Relation Age of Onset  . Coronary artery disease Father   . Coronary artery disease Sister   . Coronary artery disease Brother    Social History:  reports that she has never smoked. She has never used smokeless tobacco. She reports that  drinks alcohol. She reports that she does not use illicit drugs.  Allergies:  Allergies  Allergen Reactions  . Codeine Nausea Only  . Erythromycin Itching  . Lopid (Gemfibrozil) Other (See Comments)   unknown     (Not in a hospital admission)  No results found for this or any previous visit (from the past 48 hour(s)). No results found.  Review of Systems  Constitutional: Negative for fever.  Respiratory: Positive for cough.   Cardiovascular: Negative for chest pain.  Gastrointestinal: Negative for nausea, vomiting and abdominal pain.  Genitourinary: Negative for dysuria and hematuria.  Neurological: Negative for headaches.    Blood pressure 145/82, pulse 80, temperature 96.9 F (36.1 C), temperature source Oral, resp. rate 20, height 5\' 1"  (1.549 m), weight 150 lb (68.04 kg), SpO2 97.00%. Physical Exam  Constitutional: She is oriented to person, place, and time. She appears well-developed and well-nourished.  Cardiovascular: Normal rate, regular rhythm and normal heart sounds.   No murmur heard. Respiratory: Effort normal and breath sounds normal. She has no wheezes.  GI: Soft. Bowel sounds are normal. There is no tenderness.  Neurological: She is alert and oriented to person, place, and time.  Psychiatric: She has a normal mood and affect. Her behavior is normal. Judgment and thought content normal.     Assessment/Plan Left renal mass Hx renal cell ca Scheduled for renal mass bx Pt aware of procedure benefits and risks and agreeable to proceed Consent signed and in chart   Erikah Thumm A  03/09/2012, 10:00 AM

## 2012-03-10 ENCOUNTER — Telehealth (HOSPITAL_COMMUNITY): Payer: Self-pay | Admitting: *Deleted

## 2012-03-14 ENCOUNTER — Other Ambulatory Visit (HOSPITAL_COMMUNITY): Payer: Self-pay | Admitting: Urology

## 2012-03-14 DIAGNOSIS — D49519 Neoplasm of unspecified behavior of unspecified kidney: Secondary | ICD-10-CM

## 2012-05-29 ENCOUNTER — Other Ambulatory Visit (HOSPITAL_COMMUNITY): Payer: Self-pay | Admitting: Urology

## 2012-05-29 ENCOUNTER — Ambulatory Visit (HOSPITAL_COMMUNITY)
Admission: RE | Admit: 2012-05-29 | Discharge: 2012-05-29 | Disposition: A | Discharge status: Home / Self Care | Payer: PRIVATE HEALTH INSURANCE | Source: Ambulatory Visit | Attending: Urology | Admitting: Urology

## 2012-05-29 DIAGNOSIS — D49519 Neoplasm of unspecified behavior of unspecified kidney: Secondary | ICD-10-CM

## 2012-05-29 DIAGNOSIS — K838 Other specified diseases of biliary tract: Secondary | ICD-10-CM | POA: Insufficient documentation

## 2012-05-29 DIAGNOSIS — N281 Cyst of kidney, acquired: Secondary | ICD-10-CM | POA: Insufficient documentation

## 2012-05-29 DIAGNOSIS — Z905 Acquired absence of kidney: Secondary | ICD-10-CM | POA: Insufficient documentation

## 2012-05-29 DIAGNOSIS — Z85528 Personal history of other malignant neoplasm of kidney: Secondary | ICD-10-CM | POA: Insufficient documentation

## 2012-05-29 LAB — CREATININE, SERUM
GFR calc Af Amer: 35 mL/min — ABNORMAL LOW (ref >90)
GFR calc non Af Amer: 30 mL/min — ABNORMAL LOW (ref >90)

## 2012-08-29 ENCOUNTER — Encounter: Payer: Self-pay | Admitting: Cardiology

## 2012-08-29 ENCOUNTER — Ambulatory Visit (INDEPENDENT_AMBULATORY_CARE_PROVIDER_SITE_OTHER): Payer: PRIVATE HEALTH INSURANCE | Admitting: Cardiology

## 2012-08-29 VITALS — BP 130/78 | HR 70 | Ht 60.0 in | Wt 154.9 lb

## 2012-08-29 DIAGNOSIS — C649 Malignant neoplasm of unspecified kidney, except renal pelvis: Secondary | ICD-10-CM

## 2012-08-29 DIAGNOSIS — C642 Malignant neoplasm of left kidney, except renal pelvis: Secondary | ICD-10-CM

## 2012-08-29 DIAGNOSIS — E669 Obesity, unspecified: Secondary | ICD-10-CM

## 2012-08-29 DIAGNOSIS — I10 Essential (primary) hypertension: Secondary | ICD-10-CM

## 2012-08-29 DIAGNOSIS — Z951 Presence of aortocoronary bypass graft: Secondary | ICD-10-CM

## 2012-08-29 DIAGNOSIS — I2510 Atherosclerotic heart disease of native coronary artery without angina pectoris: Secondary | ICD-10-CM

## 2012-08-29 DIAGNOSIS — E785 Hyperlipidemia, unspecified: Secondary | ICD-10-CM

## 2012-08-29 NOTE — Patient Instructions (Signed)
No changes today  Your physician recommends that you schedule a follow-up appointment 12 months Dr Ellyn Hack

## 2012-08-31 ENCOUNTER — Encounter: Payer: Self-pay | Admitting: Cardiology

## 2012-09-03 ENCOUNTER — Encounter: Payer: Self-pay | Admitting: Cardiology

## 2012-09-03 DIAGNOSIS — E669 Obesity, unspecified: Secondary | ICD-10-CM | POA: Insufficient documentation

## 2012-09-03 DIAGNOSIS — E785 Hyperlipidemia, unspecified: Secondary | ICD-10-CM | POA: Insufficient documentation

## 2012-09-03 DIAGNOSIS — E1169 Type 2 diabetes mellitus with other specified complication: Secondary | ICD-10-CM | POA: Insufficient documentation

## 2012-09-03 NOTE — Assessment & Plan Note (Signed)
She definitely recovered from the surgery.  I member when she came back the first postop visit her to she just noted how much better she felt once the soreness went away.  She didn't realize how long she had been dealing with exertional dyspnea, before the angina came.

## 2012-09-03 NOTE — Assessment & Plan Note (Signed)
I have not seen any labs on her since 2012, poorly because this is being followed by her primary physician's office.  She is on Niaspan and atorvastatin without any myalgias.  I don't think that she is having a reading related to her medications.

## 2012-09-03 NOTE — Assessment & Plan Note (Signed)
She had very classic symptoms of angina when she came in for her catheterization 2 years ago.  She has not had anything like that since her CABG.  She is on a stable regimen of aspirin beta blocker and statin with niacin/Niaspan.  She is a lobe of mild edema on occasion that is well-controlled with her Lasix.  She uses it with a sliding scale plan.  This actually predated her coronary disease.  Plan.  Continue current regimen.  Followup in 1 year.  Would probably consider checking a stress test in about 2 years for followup if no symptoms, just to evaluate for graft patency.

## 2012-09-03 NOTE — Assessment & Plan Note (Signed)
She had been doing fairly well the last time I saw her pain was down to 27.  She does acknowledge that she has gotten away from Weight Watchers and has not been exercising much his tube.  She chided herself for getting away from her exercise and diet routine.  I think she's gotten carried away with the new exciting social situation.  Plan: Restart exercise as before, along with Weight Watchers diet plan.

## 2012-09-03 NOTE — Assessment & Plan Note (Signed)
Fairly well-controlled on moderate dose of beta blocker.  She is also on low-dose furosemide as a diuretic.  Plan: Continue current regimen.  If low pressure were to increase, would consider restarting ARB or ACE inhibitor if okay from nephrology.  Her renal function has been relatively stable.

## 2012-09-03 NOTE — Progress Notes (Signed)
Patient ID: Dana Jackson, female   DOB: 05-07-1945, 67 y.o.   MRN: [id number] PCP: Stephens Shire, MD  Clinic Note: Chief Complaint  Patient presents with  . ROV 1 year    Has some cramps in her legs every once in a while   HPI: Dana Jackson is a 67 y.o. female with a PMH of prior TIA and carotid directly as well as partial nephrectomy from renal cell carcinoma with residual CK D-3B who presents today for followup of her CAD status post CABG. As you recall she is a very pleasant woman who I first met in consultation just prior to Thanksgiving of 2012.  She came in with exertional chest tightness and shortness of breath that was very concerning for exertional angina.  His was in the setting of also having inferior lateral T-wave inversions.  She wanted to spend Thanksgiving with her family, but noted significant symptoms during the patient and how they.  She was brought in for cardiac apposition medially after which showed extensive multivessel disease she is referred for CABG later that week by Dr. Roxy Manns.  She really has been stable from a cardiac standpoint ever since. My last visit with her was September of last year.    Interval History: Since her last visit.  She's been doing quite well he'll be really notes occasional cramping here and there as well as mild arthritic pains.  She denies any symptoms of chest pain or shortness of breath with rest or exertion.  She's been spending the last several months she snapped her 3-year-old a 71-year-old who had been living with her daughter as foster children with a plan for possible duction.  She has a big smile on her face at the prospect of becoming a grandmother of his children.  She is not really doing a lot of exercise, but has been having her hands full staying busy with his children.  She has gotten away from her Weight Watchers diet of late however.  She notes that she's put on some weight.    The remainder of Cardiovascular ROS: negative for  - chest pain, dyspnea on exertion, edema, irregular heartbeat, loss of consciousness, orthopnea, palpitations, paroxysmal nocturnal dyspnea, rapid heart rate or shortness of breath is as follows: Additional cardiac review of systems: Lightheadedness - no, dizziness - no, syncope/near-syncope - no; TIA/amaurosis fugax - no Melena - no, hematochezia no; hematuria - no; nosebleeds - no; claudication - no    Past Medical History  Diagnosis Date  . 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  95% ostial circumflex, 80-90% PDA  . 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 History of unstable angina November/December 2012    T wave inversions in inferolateral leads.  No stress test performed.  . H/O echocardiogram 12/24/10    Mild concentric LVH.  EF greater than 55%, mild aortic sclerosis  . TIA (transient ischemic attack) 1992 &  2010  . Hypertension, essential, benign   . Hyperlipidemia LDL goal <70   . CKD (chronic kidney disease) stage 3, GFR 30-59 ml/min   . Renal cell carcinoma 11/18/2008    T2aNx s/p partial left nephrectomy  . GERD (gastroesophageal reflux disease)   . Stroke   . Arthritis   . History of asthma      childhood  . History of pneumonia     "  2-3 times"  . Degenerative disc disease, cervical     Degenerative disc disease, cervical [722.4]    Prior Cardiac Evaluation and Past Surgical History: Past Surgical History  Procedure Laterality Date  . Partial nephrectomy Left 11/18/2008    left partial nephrectomy for renal cell CA  . Anterior cervical decomp/discectomy fusion  10/11/2005    multi-level  . Cardiac catheterization  12/24/10  . Tonsillectomy      "in college"  . Cholecystectomy  ~ 1971  . Tubal ligation  ~ 1984  . Coronary artery bypass graft  12/25/2010    Procedure: CORONARY ARTERY BYPASS GRAFTING (CABGX3 - LIMA-LAD, SVG RPL, SVG-Circumflex);  Surgeon:  Rexene Alberts, MD;  Location: [place];  Service: Open Heart Surgery;  Laterality: N/A;  . Carotid endarterectomy Left    Allergies  Allergen Reactions  . Codeine Nausea Only  . Erythromycin Itching  . Lopid [Gemfibrozil] Other (See Comments)    unknown   Current Outpatient Prescriptions  Medication Sig Dispense Refill  . aspirin 81 MG tablet Take 81 mg by mouth daily.        [name] atorvastatin (LIPITOR) 40 MG tablet Take 40 mg by mouth daily.      . furosemide (LASIX) 20 MG tablet Take 20 mg by mouth daily.      [name] loperamide (IMODIUM) 2 MG capsule Take 2 mg by mouth 4 (four) times daily as needed for diarrhea or loose stools.      [name] MAGNESIUM PO Take 400 mg by mouth daily.        . metoprolol (LOPRESSOR) 50 MG tablet Take 25 mg by mouth 2 (two) times daily.      . niacin (NIASPAN) 1000 MG CR tablet Take 1,000-2,000 mg by mouth at bedtime. Take 1000mg  on night 1, 2000mg  on night 2, 1000mg  on night 3, and continue accordingly      . nitroGLYCERIN (NITROSTAT) 0.4 MG SL tablet Place 0.4 mg under the tongue every 5 (five) minutes as needed for chest pain.      [name] omeprazole (PRILOSEC) 40 MG capsule Take 40 mg by mouth daily.         No current facility-administered medications for this visit.    History   Social History Narrative   She is a married Mother of 2.  -- Currently being very busy taking care of 2 foster children that are staying with her daughter.  A 68-year-old and a 44-year-old that they're trying to adopt.  She is very excited about the possibility of becoming a Grandmother.   She does walk and getting exercise, but she is wanting to get back into more activities just because she has really been limited due to her arthritic pains. She used to do things like walking and biking, and she may try to do some biking again, or at least stationary biking.    Does not smoke, does not drink.   ROS: A comprehensive Review of Systems - Negative except Pertinent positives noted above and  below. Musculoskeletal ROS: positive for - pain in Back and neck radiate to shoulders.  Also pains in her hips and legs, less pronounced.  Other to keep her from exercising much. and Mild cramps on occasion  PHYSICAL EXAM BP 130/78  Pulse 70  Ht 5' (1.524 m)  Wt 154 lb 14.4 oz (70.262 kg)  BMI 30.25 kg/m2 -- up from 151 pounds (which was 6 pounds up from postop weight) General: She is a very pleasant, healthy appearing  woman. NAD, A&O x 3.  Answers questions appropriately. HEENT: NCAT, EOMI, MMM. Neck: Supple without LAN, JVD or carotid bruit.  Heart: RRR, normal S1, S2, soft 1/6 C-D SEM  heard at theRUSB, No R/G. Nondisplaced PMI.  Lungs: CTAB, nonlabored, normal effort, good air movement.  Abdomen: Soft/NT/ND/NABS, no HSM.  Extremities: No C/C/E, 2+ and equal pulses throughout Neuro: CN 2-12 grossly intact  [id number] today: Yes Rate: 70 , Rhythm: Normal Sinus Rhythm  normal ECG;    Recent Labs: None recently  ASSESSMENT / PLAN: CAD in native artery She had very classic symptoms of angina when she came in for her catheterization 2 years ago.  She has not had anything like that since her CABG.  She is on a stable regimen of aspirin beta blocker and statin with niacin/Niaspan.  She is a lobe of mild edema on occasion that is well-controlled with her Lasix.  She uses it with a sliding scale plan.  This actually predated her coronary disease.  Plan.  Continue current regimen.  Followup in 1 year.  Would probably consider checking a stress test in about 2 years for followup if no symptoms, just to evaluate for graft patency.  S/P CABG x 3 She definitely recovered from the surgery.  I member when she came back the first postop visit her to she just noted how much better she felt once the soreness went away.  She didn't realize how long she had been dealing with exertional dyspnea, before the angina came.  Hyperlipidemia LDL goal <70 I have not seen any labs on her since 2012, poorly  because this is being followed by her primary physician's office.  She is on Niaspan and atorvastatin without any myalgias.  I don't think that she is having a reading related to her medications.  Hypertension Fairly well-controlled on moderate dose of beta blocker.  She is also on low-dose furosemide as a diuretic.  Plan: Continue current regimen.  If low pressure were to increase, would consider restarting ARB or ACE inhibitor if okay from nephrology.  Her renal function has been relatively stable.  Obesity (BMI 30-39.9) She had been doing fairly well the last time I saw her pain was down to 27.  She does acknowledge that she has gotten away from Weight Watchers and has not been exercising much his tube.  She chided herself for getting away from her exercise and diet routine.  I think she's gotten carried away with the new exciting social situation.  Plan: Restart exercise as before, along with Weight Watchers diet plan.   Orders Placed This Encounter  Procedures  . EKG 12-Lead   No orders of the defined types were placed in this encounter.    Followup: One year  Jamal Pavon W. Ellyn Hack, M.D., M.S. [facility] [address] [place], [place]  [address]  [phone] Pager # [phone]

## 2012-10-18 ENCOUNTER — Other Ambulatory Visit (HOSPITAL_COMMUNITY): Payer: Self-pay | Admitting: Urology

## 2012-10-18 DIAGNOSIS — D49519 Neoplasm of unspecified behavior of unspecified kidney: Secondary | ICD-10-CM

## 2012-10-30 ENCOUNTER — Encounter: Payer: Self-pay | Admitting: Cardiology

## 2012-12-13 ENCOUNTER — Other Ambulatory Visit (HOSPITAL_COMMUNITY): Payer: Self-pay | Admitting: Urology

## 2012-12-13 ENCOUNTER — Ambulatory Visit (HOSPITAL_COMMUNITY)
Admission: RE | Admit: 2012-12-13 | Discharge: 2012-12-13 | Disposition: A | Discharge status: Home / Self Care | Payer: PRIVATE HEALTH INSURANCE | Source: Ambulatory Visit | Attending: Urology | Admitting: Urology

## 2012-12-13 DIAGNOSIS — D4959 Neoplasm of unspecified behavior of other genitourinary organ: Secondary | ICD-10-CM

## 2012-12-13 DIAGNOSIS — C649 Malignant neoplasm of unspecified kidney, except renal pelvis: Secondary | ICD-10-CM | POA: Insufficient documentation

## 2012-12-15 ENCOUNTER — Other Ambulatory Visit (HOSPITAL_COMMUNITY): Payer: Self-pay | Admitting: Urology

## 2012-12-15 ENCOUNTER — Ambulatory Visit (HOSPITAL_COMMUNITY)
Admission: RE | Admit: 2012-12-15 | Discharge: 2012-12-15 | Disposition: A | Discharge status: Home / Self Care | Payer: Medicare Other | Source: Ambulatory Visit | Attending: Urology | Admitting: Urology

## 2012-12-15 DIAGNOSIS — D49519 Neoplasm of unspecified behavior of unspecified kidney: Secondary | ICD-10-CM

## 2012-12-15 DIAGNOSIS — N281 Cyst of kidney, acquired: Secondary | ICD-10-CM | POA: Insufficient documentation

## 2012-12-15 DIAGNOSIS — M47817 Spondylosis without myelopathy or radiculopathy, lumbosacral region: Secondary | ICD-10-CM | POA: Insufficient documentation

## 2012-12-15 DIAGNOSIS — C649 Malignant neoplasm of unspecified kidney, except renal pelvis: Secondary | ICD-10-CM | POA: Insufficient documentation

## 2012-12-15 DIAGNOSIS — K838 Other specified diseases of biliary tract: Secondary | ICD-10-CM | POA: Insufficient documentation

## 2013-05-17 ENCOUNTER — Other Ambulatory Visit (HOSPITAL_COMMUNITY): Payer: Self-pay | Admitting: Urology

## 2013-05-17 DIAGNOSIS — C649 Malignant neoplasm of unspecified kidney, except renal pelvis: Secondary | ICD-10-CM

## 2013-06-11 DIAGNOSIS — B9620 Unspecified Escherichia coli [E. coli] as the cause of diseases classified elsewhere: Secondary | ICD-10-CM

## 2013-06-11 DIAGNOSIS — R7881 Bacteremia: Secondary | ICD-10-CM

## 2013-06-11 HISTORY — DX: Unspecified Escherichia coli (E. coli) as the cause of diseases classified elsewhere: B96.20

## 2013-06-11 HISTORY — DX: Bacteremia: R78.81

## 2013-07-10 ENCOUNTER — Ambulatory Visit (HOSPITAL_COMMUNITY)
Admission: RE | Admit: 2013-07-10 | Discharge: 2013-07-10 | Disposition: A | Discharge status: Home / Self Care | Payer: Medicare Other | Source: Ambulatory Visit | Attending: Urology | Admitting: Urology

## 2013-07-10 ENCOUNTER — Other Ambulatory Visit (HOSPITAL_COMMUNITY): Payer: PRIVATE HEALTH INSURANCE

## 2013-07-10 DIAGNOSIS — N281 Cyst of kidney, acquired: Secondary | ICD-10-CM | POA: Insufficient documentation

## 2013-07-10 DIAGNOSIS — Z905 Acquired absence of kidney: Secondary | ICD-10-CM | POA: Insufficient documentation

## 2013-07-10 DIAGNOSIS — C649 Malignant neoplasm of unspecified kidney, except renal pelvis: Secondary | ICD-10-CM

## 2013-09-03 ENCOUNTER — Encounter: Payer: Self-pay | Admitting: Cardiology

## 2013-09-03 ENCOUNTER — Ambulatory Visit (INDEPENDENT_AMBULATORY_CARE_PROVIDER_SITE_OTHER): Payer: PRIVATE HEALTH INSURANCE | Admitting: Cardiology

## 2013-09-03 VITALS — BP 122/80 | HR 73 | Ht 60.0 in | Wt 158.0 lb

## 2013-09-03 DIAGNOSIS — I10 Essential (primary) hypertension: Secondary | ICD-10-CM

## 2013-09-03 DIAGNOSIS — Z951 Presence of aortocoronary bypass graft: Secondary | ICD-10-CM

## 2013-09-03 DIAGNOSIS — E785 Hyperlipidemia, unspecified: Secondary | ICD-10-CM

## 2013-09-03 DIAGNOSIS — I2510 Atherosclerotic heart disease of native coronary artery without angina pectoris: Secondary | ICD-10-CM

## 2013-09-03 MED ORDER — NITROGLYCERIN 0.4 MG SL SUBL: 0.4000 mg | SUBLINGUAL_TABLET | SUBLINGUAL | Status: DC | PRN

## 2013-09-03 NOTE — Progress Notes (Signed)
PCP: Stephens Shire, MD Nephrologist: Dr. Joelyn Oms  Clinic Note: Chief Complaint  Patient presents with  . Annual Exam    NO CHEST PAIN, LITTLE SOB SOMETIMES, NO EDEMA    HPI: Dana Jackson is a 68 y.o. female with a PMH of prior TIA and carotid directly as well as partial nephrectomy from renal cell carcinoma with residual CK D-3B who presents today for followup of her CAD status post CABG.  Interval History: She continues to do well in the past year with no further concerning episodes of possible angina.  Rare exertional dyspnea if she over-exerts (& has allowed herself to get out of shape).  No rest or exertional Angina.  Occasional orthostatic dizziness in early AM. The remainder of her Cardiovascular ROS is as follows:  No PND, orthopnea or edema.   No palpitations, weakness or syncope/near syncope.  No TIA/amaurosis fugax symptoms.  No melena, hematochezia, hematuria, or epstaxis.  No claudication.  Past Medical History  Diagnosis Date  . 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  95% ostial circumflex, 80-90% PDA  . 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 History of unstable angina November/December 2012    T wave inversions in inferolateral leads.  No stress test performed.  . H/O echocardiogram 12/24/10    Mild concentric LVH.  EF greater than 55%, mild aortic sclerosis  . TIA (transient ischemic attack) 1992 &  2010  . Hypertension, essential, benign   . Hyperlipidemia LDL goal <70   . CKD (chronic kidney disease) stage 3, GFR 30-59 ml/min   . Renal cell carcinoma 11/18/2008    T2aNx s/p partial left nephrectomy  . GERD (gastroesophageal reflux disease)   . Stroke   . Arthritis   . History of asthma      childhood  . History of pneumonia     "2-3 times"  . Degenerative disc disease, cervical     Degenerative disc disease, cervical [722.4]    Prior  Cardiac Evaluation and Past Surgical History: Procedure Laterality Date  . Partial nephrectomy Left 11/18/2008    left partial nephrectomy for renal cell CA  . Cardiac catheterization  12/24/10  . Coronary artery bypass graft  12/25/2010    Procedure: CORONARY ARTERY BYPASS GRAFTING (CABGX3 - LIMA-LAD, SVG RPL, SVG-Circumflex);  Surgeon: Rexene Alberts, MD;  Location: [facility];  Service: Open Heart Surgery;  Laterality: N/A;  . Carotid endarterectomy Left    MEDICATIONS AND ALLERGIES REVIEWED IN EPIC No Change in Social and Family History  ROS: A comprehensive Review of Systems - was performed. Review of Systems  Constitutional: Negative for malaise/fatigue.       Has become lax with her diet => off of Wgt. Watchers. Less exercise.  Eyes: Positive for blurred vision.       Pending Cataract surgery  Respiratory: Negative for cough, hemoptysis, sputum production, shortness of breath and wheezing.   Cardiovascular:       Per HPI  Gastrointestinal: Negative for heartburn, blood in stool and melena.  Genitourinary: Negative for hematuria.  Musculoskeletal:       Mild "aches & pains"  Neurological: Positive for tingling. Negative for dizziness, sensory change, speech change, focal weakness, seizures, loss of consciousness and headaches.       Bilateral arm fuzzy feeling  Psychiatric/Behavioral: Negative.   All other systems reviewed and are negative.  Wt Readings from Last 3 Encounters:  09/03/13 158 lb (71.668 kg)  08/29/12 154 lb 14.4 oz (70.262 kg)  03/09/12 150 lb (68.04 kg)   PHYSICAL EXAM BP 122/80  Pulse 73  Ht 5' (1.524 m)  Wt 158 lb (71.668 kg)  BMI 30.86 kg/m2 General appearance: alert, cooperative, appears stated age, no distress and Well-nourished and well-groomed. Very talkative. Intelligent gentleman. Answers questions appropriately.  Neck: no adenopathy, no carotid bruit, no JVD and supple, symmetrical, trachea midline  Lungs: CTAB, normal percussion bilaterally and  Nonlabored, good air movement  Heart: RRR with ectopy, S1: normal, S2: physiologically split and Otherwise no M/R/G.  Abdomen: soft, non-tender; bowel sounds normal; no masses, no organomegaly  Extremities: extremities normal, atraumatic, no cyanosis or edema, no edema, redness or tenderness in the calves or thighs and no ulcers, gangrene or trophic changes  Pulses: 2+ and symmetric  Neurologic: Grossly normal   Adult ECG Report  Rate: 73 ;  Rhythm: normal sinus rhythm -   Narrative Interpretation: normal EKG  Recent Labs :  Not available.  Checked by PCP   ASSESSMENT / PLAN: CAD in native artery Doing well - no further episodes of Angina (she had classic Angina symptoms leading to her cath). On stable regimen: BB, ASA, Statin/Niacin.  Plan: continue current regimen; 1 yr f/u. Refill PRN NTG  S/P CABG x 3 With Surgery in 2012 - surveillance Myoview in Nov 2016.  Essential hypertension Well controlled on moderate dose BB. Low dose diuretic ~PRN for intermittent hand edema  Hyperlipidemia LDL goal <70 Monitored by PCP - on statin & niacin -->Previously well controlled & close to goal.  Would strongly consider NMR panel for reassurance of screening lab accuracy.    Orders Placed This Encounter  Procedures  . EKG 12-Lead   Meds ordered this encounter  Medications  . nitroGLYCERIN (NITROSTAT) 0.4 MG SL tablet    Sig: Place 1 tablet (0.4 mg total) under the tongue every 5 (five) minutes as needed for chest pain.    Dispense:  25 tablet    Refill:  3     Followup: 12 months  Jarrod Mcenery W. Ellyn Hack, M.D., M.S. Interventional Cardiologist [facility]

## 2013-09-03 NOTE — Patient Instructions (Signed)
NO CHANGE IN CURRENT MEDICATIONS   YOUR NTG was refilled.  Please have your PRIMARY TO SEND COPY OF LIPID LEVEL  Your physician wants you to follow-up in 12 months Dr Ellyn Hack.You will receive a reminder letter in the mail two months in advance. If you don't receive a letter, please call our office to schedule the follow-up appointment.

## 2013-09-04 ENCOUNTER — Encounter: Payer: Self-pay | Admitting: Cardiology

## 2013-09-04 NOTE — Assessment & Plan Note (Signed)
Monitored by PCP - on statin & niacin -->Previously well controlled & close to goal.  Would strongly consider NMR panel for reassurance of screening lab accuracy.

## 2013-09-04 NOTE — Assessment & Plan Note (Signed)
With Surgery in 2012 - surveillance Myoview in Nov 2016.

## 2013-09-04 NOTE — Assessment & Plan Note (Signed)
Well controlled on moderate dose BB. Low dose diuretic ~PRN for intermittent hand edema

## 2013-09-04 NOTE — Assessment & Plan Note (Addendum)
Doing well - no further episodes of Angina (she had classic Angina symptoms leading to her cath). On stable regimen: BB, ASA, Statin/Niacin.  Plan: continue current regimen; 1 yr f/u. Refill PRN NTG

## 2013-10-22 ENCOUNTER — Encounter (HOSPITAL_COMMUNITY): Payer: Self-pay | Admitting: Pharmacist

## 2013-11-01 ENCOUNTER — Encounter (HOSPITAL_COMMUNITY): Payer: Self-pay

## 2013-11-01 ENCOUNTER — Encounter (HOSPITAL_COMMUNITY)
Admission: RE | Admit: 2013-11-01 | Discharge: 2013-11-01 | Disposition: A | Discharge status: Home / Self Care | Payer: Medicare Other | Source: Ambulatory Visit | Attending: Obstetrics and Gynecology | Admitting: Obstetrics and Gynecology

## 2013-11-01 ENCOUNTER — Encounter (INDEPENDENT_AMBULATORY_CARE_PROVIDER_SITE_OTHER): Payer: Self-pay

## 2013-11-01 DIAGNOSIS — Z885 Allergy status to narcotic agent status: Secondary | ICD-10-CM | POA: Diagnosis not present

## 2013-11-01 DIAGNOSIS — Z951 Presence of aortocoronary bypass graft: Secondary | ICD-10-CM | POA: Diagnosis not present

## 2013-11-01 DIAGNOSIS — N950 Postmenopausal bleeding: Secondary | ICD-10-CM | POA: Diagnosis present

## 2013-11-01 DIAGNOSIS — I2510 Atherosclerotic heart disease of native coronary artery without angina pectoris: Secondary | ICD-10-CM | POA: Diagnosis not present

## 2013-11-01 DIAGNOSIS — E785 Hyperlipidemia, unspecified: Secondary | ICD-10-CM | POA: Diagnosis not present

## 2013-11-01 DIAGNOSIS — M5030 Other cervical disc degeneration, unspecified cervical region: Secondary | ICD-10-CM | POA: Diagnosis not present

## 2013-11-01 DIAGNOSIS — K219 Gastro-esophageal reflux disease without esophagitis: Secondary | ICD-10-CM | POA: Diagnosis not present

## 2013-11-01 DIAGNOSIS — I129 Hypertensive chronic kidney disease with stage 1 through stage 4 chronic kidney disease, or unspecified chronic kidney disease: Secondary | ICD-10-CM | POA: Diagnosis not present

## 2013-11-01 DIAGNOSIS — C541 Malignant neoplasm of endometrium: Secondary | ICD-10-CM | POA: Diagnosis not present

## 2013-11-01 DIAGNOSIS — Z881 Allergy status to other antibiotic agents status: Secondary | ICD-10-CM | POA: Diagnosis not present

## 2013-11-01 DIAGNOSIS — Z8553 Personal history of malignant neoplasm of renal pelvis: Secondary | ICD-10-CM | POA: Diagnosis not present

## 2013-11-01 DIAGNOSIS — N183 Chronic kidney disease, stage 3 (moderate): Secondary | ICD-10-CM | POA: Diagnosis not present

## 2013-11-01 DIAGNOSIS — Z888 Allergy status to other drugs, medicaments and biological substances status: Secondary | ICD-10-CM | POA: Diagnosis not present

## 2013-11-01 DIAGNOSIS — Z8673 Personal history of transient ischemic attack (TIA), and cerebral infarction without residual deficits: Secondary | ICD-10-CM | POA: Diagnosis not present

## 2013-11-01 HISTORY — DX: Unspecified asthma, uncomplicated: J45.909

## 2013-11-01 LAB — COMPREHENSIVE METABOLIC PANEL
ALBUMIN: 3.5 g/dL (ref 3.5–5.2)
ALK PHOS: 107 U/L (ref 39–117)
ALT: 23 U/L (ref 0–35)
AST: 26 U/L (ref 0–37)
Anion gap: 9 (ref 5–15)
BILIRUBIN TOTAL: 0.5 mg/dL (ref 0.3–1.2)
BUN: 28 mg/dL — ABNORMAL HIGH (ref 6–23)
CHLORIDE: 102 meq/L (ref 96–112)
CO2: 29 mEq/L (ref 19–32)
Calcium: 9.5 mg/dL (ref 8.4–10.5)
Creatinine, Ser: 1.77 mg/dL — ABNORMAL HIGH (ref 0.50–1.10)
GFR calc Af Amer: 33 mL/min — ABNORMAL LOW (ref >90)
GFR calc non Af Amer: 28 mL/min — ABNORMAL LOW (ref >90)
Glucose, Bld: 117 mg/dL — ABNORMAL HIGH (ref 70–99)
POTASSIUM: 5.2 meq/L (ref 3.7–5.3)
Sodium: 140 mEq/L (ref 137–147)
Total Protein: 6.5 g/dL (ref 6.0–8.3)

## 2013-11-01 LAB — CBC
HCT: 39 % (ref 36.0–46.0)
Hemoglobin: 13.2 g/dL (ref 12.0–15.0)
MCH: 31.3 pg (ref 26.0–34.0)
MCHC: 33.8 g/dL (ref 30.0–36.0)
MCV: 92.4 fL (ref 78.0–100.0)
PLATELETS: 132 10*3/uL — AB (ref 150–400)
RBC: 4.22 MIL/uL (ref 3.87–5.11)
RDW: 12 % (ref 11.5–15.5)
WBC: 7 10*3/uL (ref 4.0–10.5)

## 2013-11-01 NOTE — Pre-Procedure Instructions (Signed)
Dr. Jillyn Hidden made aware of platelets 132 no new orders received at this time.

## 2013-11-01 NOTE — Patient Instructions (Signed)
Your procedure is scheduled on:  Friday, Oct. 23, 2015  Enter through the Main Entrance of [facility] at: 6:00 a.m.  Pick up the phone at the desk and dial [phone].  Call this number if you have problems the morning of surgery: [phone].  Remember: Do NOT eat food: After midnight tonight Do NOT drink clear liquids after: After midnight tonight Take these medicines the morning of surgery with a SIP OF WATER:  Metoprolol, Omeprazole  Do NOT wear jewelry (body piercing), metal hair clips/bobby pins, make-up, or nail polish. Do NOT wear lotions, powders, or perfumes.  You may wear deoderant. Do NOT shave for 48 hours prior to surgery. Do NOT bring valuables to the hospital. Contacts, dentures, or bridgework may not be worn into surgery. Have a responsible adult drive you home and stay with you for 24 hours after your procedure

## 2013-11-02 ENCOUNTER — Encounter (HOSPITAL_COMMUNITY): Payer: Self-pay

## 2013-11-02 ENCOUNTER — Ambulatory Visit (HOSPITAL_COMMUNITY)
Admission: RE | Admit: 2013-11-02 | Discharge: 2013-11-02 | Disposition: A | Discharge status: Home / Self Care | Payer: Medicare Other | Source: Ambulatory Visit | Attending: Obstetrics and Gynecology | Admitting: Obstetrics and Gynecology

## 2013-11-02 ENCOUNTER — Encounter (HOSPITAL_COMMUNITY): Payer: Medicare Other | Admitting: Anesthesiology

## 2013-11-02 ENCOUNTER — Encounter (HOSPITAL_COMMUNITY)
Admission: RE | Disposition: A | Discharge status: Home / Self Care | Payer: Self-pay | Source: Ambulatory Visit | Attending: Obstetrics and Gynecology

## 2013-11-02 ENCOUNTER — Ambulatory Visit (HOSPITAL_COMMUNITY): Payer: Medicare Other | Admitting: Anesthesiology

## 2013-11-02 DIAGNOSIS — Z881 Allergy status to other antibiotic agents status: Secondary | ICD-10-CM | POA: Insufficient documentation

## 2013-11-02 DIAGNOSIS — I2510 Atherosclerotic heart disease of native coronary artery without angina pectoris: Secondary | ICD-10-CM | POA: Diagnosis not present

## 2013-11-02 DIAGNOSIS — E785 Hyperlipidemia, unspecified: Secondary | ICD-10-CM | POA: Insufficient documentation

## 2013-11-02 DIAGNOSIS — Z888 Allergy status to other drugs, medicaments and biological substances status: Secondary | ICD-10-CM | POA: Insufficient documentation

## 2013-11-02 DIAGNOSIS — Z8673 Personal history of transient ischemic attack (TIA), and cerebral infarction without residual deficits: Secondary | ICD-10-CM | POA: Insufficient documentation

## 2013-11-02 DIAGNOSIS — I129 Hypertensive chronic kidney disease with stage 1 through stage 4 chronic kidney disease, or unspecified chronic kidney disease: Secondary | ICD-10-CM | POA: Insufficient documentation

## 2013-11-02 DIAGNOSIS — K219 Gastro-esophageal reflux disease without esophagitis: Secondary | ICD-10-CM | POA: Insufficient documentation

## 2013-11-02 DIAGNOSIS — Z885 Allergy status to narcotic agent status: Secondary | ICD-10-CM | POA: Insufficient documentation

## 2013-11-02 DIAGNOSIS — C541 Malignant neoplasm of endometrium: Secondary | ICD-10-CM | POA: Insufficient documentation

## 2013-11-02 DIAGNOSIS — M5030 Other cervical disc degeneration, unspecified cervical region: Secondary | ICD-10-CM | POA: Insufficient documentation

## 2013-11-02 DIAGNOSIS — Z8553 Personal history of malignant neoplasm of renal pelvis: Secondary | ICD-10-CM | POA: Insufficient documentation

## 2013-11-02 DIAGNOSIS — N183 Chronic kidney disease, stage 3 (moderate): Secondary | ICD-10-CM | POA: Insufficient documentation

## 2013-11-02 DIAGNOSIS — Z951 Presence of aortocoronary bypass graft: Secondary | ICD-10-CM | POA: Insufficient documentation

## 2013-11-02 HISTORY — PX: DILATATION & CURETTAGE/HYSTEROSCOPY WITH MYOSURE: SHX6511

## 2013-11-02 SURGERY — DILATATION & CURETTAGE/HYSTEROSCOPY WITH MYOSURE
Anesthesia: General | Site: Vagina

## 2013-11-02 MED ORDER — LIDOCAINE HCL 1 % IJ SOLN
INTRAMUSCULAR | Status: AC
  Filled 2013-11-02: qty 20

## 2013-11-02 MED ORDER — LIDOCAINE HCL (CARDIAC) 20 MG/ML IV SOLN
INTRAVENOUS | Status: DC | PRN
  Administered 2013-11-02: 60 mg via INTRAVENOUS

## 2013-11-02 MED ORDER — MIDAZOLAM HCL 2 MG/2ML IJ SOLN
INTRAMUSCULAR | Status: DC | PRN
  Administered 2013-11-02: 2 mg via INTRAVENOUS

## 2013-11-02 MED ORDER — LIDOCAINE HCL 1 % IJ SOLN
INTRAMUSCULAR | Status: DC | PRN
  Administered 2013-11-02: 20 mL

## 2013-11-02 MED ORDER — PHENYLEPHRINE HCL 10 MG/ML IJ SOLN
INTRAMUSCULAR | Status: AC
  Filled 2013-11-02: qty 1

## 2013-11-02 MED ORDER — BUPIVACAINE HCL (PF) 0.25 % IJ SOLN
INTRAMUSCULAR | Status: AC
  Filled 2013-11-02: qty 30

## 2013-11-02 MED ORDER — ONDANSETRON HCL 4 MG/2ML IJ SOLN
INTRAMUSCULAR | Status: DC | PRN
  Administered 2013-11-02: 4 mg via INTRAVENOUS

## 2013-11-02 MED ORDER — SODIUM CHLORIDE 0.9 % IR SOLN
Status: DC | PRN
  Administered 2013-11-02: 3000 mL

## 2013-11-02 MED ORDER — ONDANSETRON HCL 4 MG/2ML IJ SOLN
INTRAMUSCULAR | Status: AC
  Filled 2013-11-02: qty 2

## 2013-11-02 MED ORDER — LACTATED RINGERS IV SOLN
INTRAVENOUS | Status: DC
  Administered 2013-11-02: 06:00:00 via INTRAVENOUS

## 2013-11-02 MED ORDER — LIDOCAINE HCL (CARDIAC) 20 MG/ML IV SOLN
INTRAVENOUS | Status: AC
  Filled 2013-11-02: qty 5

## 2013-11-02 MED ORDER — ACETAMINOPHEN 325 MG PO TABS: 325.0000 mg | ORAL_TABLET | ORAL | Status: DC | PRN

## 2013-11-02 MED ORDER — DEXAMETHASONE SODIUM PHOSPHATE 4 MG/ML IJ SOLN
INTRAMUSCULAR | Status: DC | PRN
  Administered 2013-11-02: 4 mg via INTRAVENOUS

## 2013-11-02 MED ORDER — MIDAZOLAM HCL 2 MG/2ML IJ SOLN
INTRAMUSCULAR | Status: AC
  Filled 2013-11-02: qty 2

## 2013-11-02 MED ORDER — PROMETHAZINE HCL 25 MG/ML IJ SOLN: 6.2500 mg | INTRAMUSCULAR | Status: DC | PRN

## 2013-11-02 MED ORDER — ACETAMINOPHEN 160 MG/5ML PO SOLN
325.0000 mg | ORAL | Status: DC | PRN
Start: 2013-11-02 — End: 2013-11-02

## 2013-11-02 MED ORDER — CEFAZOLIN SODIUM-DEXTROSE 2-3 GM-% IV SOLR
2.0000 g | INTRAVENOUS | Status: AC
  Administered 2013-11-02: 2 g via INTRAVENOUS

## 2013-11-02 MED ORDER — CEFAZOLIN SODIUM-DEXTROSE 2-3 GM-% IV SOLR
INTRAVENOUS | Status: AC
  Filled 2013-11-02: qty 50

## 2013-11-02 MED ORDER — DEXAMETHASONE SODIUM PHOSPHATE 4 MG/ML IJ SOLN
INTRAMUSCULAR | Status: AC
  Filled 2013-11-02: qty 1

## 2013-11-02 MED ORDER — SCOPOLAMINE 1 MG/3DAYS TD PT72
1.0000 | MEDICATED_PATCH | Freq: Once | TRANSDERMAL | Status: DC
  Administered 2013-11-02: 1.5 mg via TRANSDERMAL

## 2013-11-02 MED ORDER — PROPOFOL 10 MG/ML IV EMUL
INTRAVENOUS | Status: AC
  Filled 2013-11-02: qty 20

## 2013-11-02 MED ORDER — FENTANYL CITRATE 0.05 MG/ML IJ SOLN
INTRAMUSCULAR | Status: DC | PRN
  Administered 2013-11-02: 50 ug via INTRAVENOUS

## 2013-11-02 MED ORDER — PROPOFOL 10 MG/ML IV BOLUS
INTRAVENOUS | Status: DC | PRN
  Administered 2013-11-02: 60 mg via INTRAVENOUS
  Administered 2013-11-02: 100 mg via INTRAVENOUS

## 2013-11-02 MED ORDER — FENTANYL CITRATE 0.05 MG/ML IJ SOLN
INTRAMUSCULAR | Status: AC
Start: 2013-11-02 — End: 2013-11-02
  Filled 2013-11-02: qty 2

## 2013-11-02 MED ORDER — FENTANYL CITRATE 0.05 MG/ML IJ SOLN: 25.0000 ug | INTRAMUSCULAR | Status: DC | PRN

## 2013-11-02 MED ORDER — DEXAMETHASONE SODIUM PHOSPHATE 10 MG/ML IJ SOLN
INTRAMUSCULAR | Status: AC
  Filled 2013-11-02: qty 1

## 2013-11-02 MED ORDER — SCOPOLAMINE 1 MG/3DAYS TD PT72
MEDICATED_PATCH | TRANSDERMAL | Status: AC
  Administered 2013-11-02: 1.5 mg via TRANSDERMAL
  Filled 2013-11-02: qty 1

## 2013-11-02 SURGICAL SUPPLY — 17 items
CATH ROBINSON RED A/P 16FR (CATHETERS) ×2 IMPLANT
CLOTH BEACON ORANGE TIMEOUT ST (SAFETY) ×2 IMPLANT
CONTAINER PREFILL 10% NBF 60ML (FORM) ×4 IMPLANT
DEVICE MYOSURE CLASSIC (MISCELLANEOUS) IMPLANT
DEVICE MYOSURE LITE (MISCELLANEOUS) ×1 IMPLANT
DRAPE HYSTEROSCOPY (DRAPE) ×2 IMPLANT
FILTER ARTHROSCOPY CONVERTOR (FILTER) ×2 IMPLANT
GLOVE BIO SURGEON STRL SZ7.5 (GLOVE) ×4 IMPLANT
GLOVE BIOGEL PI IND STRL 8 (GLOVE) ×1 IMPLANT
GLOVE BIOGEL PI INDICATOR 8 (GLOVE) ×1
GOWN STRL REUS W/TWL LRG LVL3 (GOWN DISPOSABLE) ×4 IMPLANT
PACK VAGINAL MINOR WOMEN LF (CUSTOM PROCEDURE TRAY) ×2 IMPLANT
PAD OB MATERNITY 4.3X12.25 (PERSONAL CARE ITEMS) ×2 IMPLANT
SEAL ROD LENS SCOPE MYOSURE (ABLATOR) ×2 IMPLANT
SET TUBING HYSTEROSCOPY 2 NDL (TUBING) ×2 IMPLANT
TOWEL OR 17X24 6PK STRL BLUE (TOWEL DISPOSABLE) ×4 IMPLANT
TUBE HYSTEROSCOPY W Y-CONNECT (TUBING) ×2 IMPLANT

## 2013-11-02 NOTE — H&P (Signed)
Dana Jackson is an 68 y.o. female with PMB and U/S C/W endometrial mass.  Pertinent Gynecological History: Menses: post-menopausal Bleeding: post menopausal bleeding Contraception: none DES exposure: denies Blood transfusions: none Sexually transmitted diseases: no past history Previous GYN Procedures: none  Last mammogram: normal Date: 2015 Last pap: normal Date: 2015 OB History: G-, P-   Menstrual History: Menarche age: unknown  No LMP recorded. Patient is postmenopausal.    Past Medical History  Diagnosis Date  . 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  95% ostial circumflex, 80-90% PDA  . 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 History of unstable angina November/December 2012    T wave inversions in inferolateral leads.  No stress test performed.  . H/O echocardiogram 12/24/10    Mild concentric LVH.  EF greater than 55%, mild aortic sclerosis  . TIA (transient ischemic attack) 1992 &  2010  . Hypertension, essential, benign   . Hyperlipidemia LDL goal <70   . CKD (chronic kidney disease) stage 3, GFR 30-59 ml/min   . Renal cell carcinoma 11/18/2008    T2aNx s/p partial left nephrectomy  . GERD (gastroesophageal reflux disease)   . Arthritis   . History of asthma      childhood  . History of pneumonia     "2-3 times"  . Degenerative disc disease, cervical     Degenerative disc disease, cervical [722.4]  . Stroke     TIA  . Asthma     childhood  . Pneumonia     walking    Past Surgical History  Procedure Laterality Date  . Partial nephrectomy Left 11/18/2008    left partial nephrectomy for renal cell CA  . Anterior cervical decomp/discectomy fusion  10/11/2005    multi-level  . Cardiac catheterization  12/24/10  . Tonsillectomy      "in college"  . Cholecystectomy  ~ 1971  . Tubal ligation  ~ 1984  . Coronary artery bypass graft  12/25/2010     Procedure: CORONARY ARTERY BYPASS GRAFTING (CABGX3 - LIMA-LAD, SVG RPL, SVG-Circumflex);  Surgeon: Rexene Alberts, MD;  Location: [place];  Service: Open Heart Surgery;  Laterality: N/A;  . Carotid endarterectomy Left   . Colonoscopy    . Upper gi endoscopy    . Wisdom tooth extraction      Family History  Problem Relation Age of Onset  . Coronary artery disease Father   . Coronary artery disease Sister   . Coronary artery disease Brother     Social History:  reports that she has never smoked. She has never used smokeless tobacco. She reports that she drinks alcohol. She reports that she does not use illicit drugs.  Allergies:  Allergies  Allergen Reactions  . Codeine Nausea Only  . Erythromycin Itching  . Lopid [Gemfibrozil] Itching    Prescriptions prior to admission  Medication Sig Dispense Refill  . aspirin 81 MG tablet Take 81 mg by mouth daily.        [name] atorvastatin (LIPITOR) 40 MG tablet Take 40 mg by mouth at bedtime.       . furosemide (LASIX) 20 MG tablet Take 20 mg by mouth daily.      [name] loperamide (IMODIUM) 2 MG capsule Take 2 mg by mouth 4 (four) times daily as needed for diarrhea or loose stools.      [name]  MAGNESIUM PO Take 400 mg by mouth daily.        . metoprolol (LOPRESSOR) 50 MG tablet Take 25 mg by mouth 2 (two) times daily.      . niacin (NIASPAN) 1000 MG CR tablet Take 1,000-2,000 mg by mouth at bedtime. Alternates 1,000 mg (1 tablet) at night with 2,000 mg (2 tablets) the next night.      . nitroGLYCERIN (NITROSTAT) 0.4 MG SL tablet Place 1 tablet (0.4 mg total) under the tongue every 5 (five) minutes as needed for chest pain.  25 tablet  3  . omeprazole (PRILOSEC) 40 MG capsule Take 40 mg by mouth daily.        . Vitamin D, Ergocalciferol, (DRISDOL) 50000 UNITS CAPS capsule Take 50,000 Units by mouth every 7 (seven) days.        ROS  Blood pressure 146/79, pulse 72, temperature 97.5 F (36.4 C), temperature source Oral, resp. rate 16, SpO2  97.00%. Physical Exam  Cardiovascular: Normal rate.   Respiratory: Effort normal.  GI: Soft.    Results for orders placed during the hospital encounter of 11/01/13 (from the past 24 hour(s))  CBC     Status: Abnormal   Collection Time    11/01/13  8:45 AM      Result Value Ref Range   WBC 7.0  4.0 - 10.5 K/uL   RBC 4.22  3.87 - 5.11 MIL/uL   Hemoglobin 13.2  12.0 - 15.0 g/dL   HCT 39.0  36.0 - 46.0 %   MCV 92.4  78.0 - 100.0 fL   MCH 31.3  26.0 - 34.0 pg   MCHC 33.8  30.0 - 36.0 g/dL   RDW 12.0  11.5 - 15.5 %   Platelets 132 (*) 150 - 400 K/uL  COMPREHENSIVE METABOLIC PANEL     Status: Abnormal   Collection Time    11/01/13  8:45 AM      Result Value Ref Range   Sodium 140  137 - 147 mEq/L   Potassium 5.2  3.7 - 5.3 mEq/L   Chloride 102  96 - 112 mEq/L   CO2 29  19 - 32 mEq/L   Glucose, Bld 117 (*) 70 - 99 mg/dL   BUN 28 (*) 6 - 23 mg/dL   Creatinine, Ser 1.77 (*) 0.50 - 1.10 mg/dL   Calcium 9.5  8.4 - 10.5 mg/dL   Total Protein 6.5  6.0 - 8.3 g/dL   Albumin 3.5  3.5 - 5.2 g/dL   AST 26  0 - 37 U/L   ALT 23  0 - 35 U/L   Alkaline Phosphatase 107  39 - 117 U/L   Total Bilirubin 0.5  0.3 - 1.2 mg/dL   GFR calc non Af Amer 28 (*) >90 mL/min   GFR calc Af Amer 33 (*) >90 mL/min   Anion gap 9  5 - 15    No results found.  Assessment/Plan: 68 yo with PMB and endometrial mass  D/W patient H/S, D&C, Myosure and risks including infection, organ damage, bleeding/transfusion, DVT/PE, pneumonia, persistent or recurrent bleeding, laparotomy, return to OR, pelvic/abdominal pain. All questions answered  Margeaux Swantek II,Merrik Puebla E 11/02/2013, 7:21 AM

## 2013-11-02 NOTE — Anesthesia Procedure Notes (Signed)
Procedure Name: LMA Insertion Date/Time: 11/02/2013 7:44 AM Performed by: Flossie Dibble Pre-anesthesia Checklist: Patient identified, Timeout performed, Emergency Drugs available, Suction available and Patient being monitored Patient Re-evaluated:Patient Re-evaluated prior to inductionOxygen Delivery Method: Circle system utilized Preoxygenation: Pre-oxygenation with 100% oxygen Intubation Type: IV induction Ventilation: Mask ventilation without difficulty LMA: LMA inserted LMA Size: 4.0 Number of attempts: 1 Placement Confirmation: breath sounds checked- equal and bilateral and positive ETCO2 Tube secured with: Tape Dental Injury: Teeth and Oropharynx as per pre-operative assessment

## 2013-11-02 NOTE — Anesthesia Preprocedure Evaluation (Addendum)
Anesthesia Evaluation  Patient identified by MRN, date of birth, ID band Patient awake    Reviewed: Allergy & Precautions, H&P , NPO status , Patient's Chart, lab work & pertinent test results  Airway Mallampati: III TM Distance: <3 FB Neck ROM: Full    Dental no notable dental hx. (+) Teeth Intact, Dental Advisory Given   Pulmonary neg shortness of breath, neg pneumonia -,  breath sounds clear to auscultation  Pulmonary exam normal       Cardiovascular Exercise Tolerance: Good hypertension, On Home Beta Blockers - angina+ CAD and + CABG (x3, 12/12 ) Rhythm:Regular Rate:Normal + Systolic murmurs (1/6 SEM) Hyperlipidemia Echo 12/12 - EF 55%   Neuro/Psych Cervical DDD - s/p ACDF  TIA (1992 and 2010)negative psych ROS   GI/Hepatic Neg liver ROS, GERD-  Medicated and Controlled,  Endo/Other  negative endocrine ROS  Renal/GU Renal InsufficiencyRenal disease (s/p partial left nephrectomy for renal cell CA 2010.  CRI - Cr 1.77)  Female GU complaint     Musculoskeletal  (+) Arthritis - (back and hip pain),   Abdominal (+) + obese,   Peds  Hematology thrombocytopenic   Anesthesia Other Findings   Reproductive/Obstetrics negative OB ROS                        Anesthesia Physical  Anesthesia Plan  ASA: III  Anesthesia Plan: General   Post-op Pain Management:    Induction: Intravenous  Airway Management Planned: LMA  Additional Equipment:   Intra-op Plan:   Post-operative Plan:   Informed Consent: I have reviewed the patients History and Physical, chart, labs and discussed the procedure including the risks, benefits and alternatives for the proposed anesthesia with the patient or authorized representative who has indicated his/her understanding and acceptance.     Plan Discussed with: CRNA, Surgeon and Anesthesiologist  Anesthesia Plan Comments:      Anesthesia Quick Evaluation

## 2013-11-02 NOTE — Transfer of Care (Signed)
Immediate Anesthesia Transfer of Care Note  Patient: Dana Jackson  Procedure(s) Performed: Procedure(s): DILATATION & CURETTAGE/HYSTEROSCOPY WITH MYOSURE ABLATION (N/A)  Patient Location: PACU  Anesthesia Type:General  Level of Consciousness: awake, alert  and oriented  Airway & Oxygen Therapy: Patient Spontanous Breathing and Patient connected to nasal cannula oxygen  Post-op Assessment: Report given to PACU RN and Post -op Vital signs reviewed and stable  Post vital signs: Reviewed and stable  Complications: No apparent anesthesia complications

## 2013-11-02 NOTE — Op Note (Signed)
NAMEDEVINN, Dana [name]:  [id number]  MEDICAL RECORD NO.:  [id number]  LOCATION:  [facility]                          FACILITY:  [facility]  PHYSICIAN:  Daleen Bo. Gaetano Net, M.D. DATE OF BIRTH:  [birth year]  DATE OF PROCEDURE:  11/02/2013 DATE OF DISCHARGE:                              OPERATIVE REPORT   PREOPERATIVE DIAGNOSIS:  Postmenopausal bleeding.  POSTOPERATIVE DIAGNOSIS:  Postmenopausal bleeding.  PROCEDURE:  Hysteroscopy, D and C, MyoSure resection.  SURGEON:  Daleen Bo. Gaetano Net, M.D.  ANESTHESIA:  General with LMA, Lyn Hollingshead, M.D.  ESTIMATED BLOOD LOSS:  Less than 50 mL.  I'S AND O'S:  Distending media, 110 mL deficit.  SPECIMENS:  Endometrial curettings and endometrial resections, both to Pathology.  INDICATIONS AND CONSENT:  This patient is a 68 year old patient with postmenopausal bleeding.  Ultrasound was suggestive of an endometrial mass.  Hysteroscopy, D and C, MyoSure have been discussed with the patient.  Potential risks and complications are reviewed preoperatively including but not limited to infection, uterine perforation, organ damage, bleeding requiring transfusion of blood products with HIV and hepatitis acquisition, DVT, PE, pneumonia, laparotomy, return to the operating room, persistent or recurrent abnormal bleeding, pelvic pain, abdominal pain.  All questions have been answered and consent is signed on the chart.  FINDINGS:  Both fallopian tube ostia identified.  There is approximately 2 cm polypoid type mass on the superior anterior endometrial cavity. There is also some flocculent tissue on the right-hand side of the endometrial cavity.  PROCEDURE IN DETAIL:  The patient was taken to the operating room where she was identified, placed in a dorsal supine position, and general anesthesia was induced via LMA.  She was placed in a dorsal lithotomy position.  Time-out undertaken.  She was prepped, bladder straight catheterized and  draped in the sterile fashion.  Bivalve speculum was placed.  Anterior cervical lip was injected with 1% plain Xylocaine and grasped with a single-tooth tenaculum.  Paracervical block was placed at the 2, 4, 5, 7, 8, and 10 o'clock positions with approximately 20 mL of the same solution.  Cervix was gently progressively dilated and the MyoSure resectoscope was placed under direct visualization with distending media.  The above findings were noted.  MyoSure was used to resect the masses. Sharp curettage was carried out.  Reinspection with the hysteroscope revealed the cavity to be clean.  All instruments were removed.  All counts were correct.  The patient was awakened, taken to recovery room in stable condition.     Daleen Bo Gaetano Net, M.D.     JET/MEDQ  D:  11/02/2013  T:  11/02/2013  Job:  [id number]

## 2013-11-02 NOTE — Brief Op Note (Signed)
11/02/2013  8:15 AM  PATIENT:  Dana Jackson  68 y.o. female  PRE-OPERATIVE DIAGNOSIS:  PMB  POST-OPERATIVE DIAGNOSIS:  post menopausal bleeding  PROCEDURE:  Procedure(s): DILATATION & CURETTAGE/HYSTEROSCOPY WITH MYOSURE ABLATION (N/A)  SURGEON:  Surgeon(s) and Role:    * Allena Katz, MD - Primary  PHYSICIAN ASSISTANT:   ASSISTANTS: none   ANESTHESIA:   general  EBL:     BLOOD ADMINISTERED:none  DRAINS: none   LOCAL MEDICATIONS USED:  LIDOCAINE  and Amount: 20 ml  SPECIMEN:  Source of Specimen:  endometrial resection, endometrial curretings  DISPOSITION OF SPECIMEN:  PATHOLOGY  COUNTS:  YES  TOURNIQUET:  * No tourniquets in log *  DICTATION: .Other Dictation: Dictation Number [id number]  PLAN OF CARE: Discharge to home after PACU  PATIENT DISPOSITION:  PACU - hemodynamically stable.   Delay start of Pharmacological VTE agent (>24hrs) due to surgical blood loss or risk of bleeding: not applicable

## 2013-11-02 NOTE — Anesthesia Postprocedure Evaluation (Signed)
Anesthesia Post Note  Patient: Dana Jackson  Procedure(s) Performed: Procedure(s) (LRB): DILATATION & CURETTAGE/HYSTEROSCOPY WITH MYOSURE ABLATION (N/A)  Anesthesia type: General  Patient location: PACU  Post pain: Pain level controlled  Post assessment: Post-op Vital signs reviewed  Last Vitals:  Filed Vitals:   11/02/13 0606  BP: 146/79  Pulse: 72  Temp: 36.4 C  Resp: 16    Post vital signs: Reviewed  Level of consciousness: sedated  Complications: No apparent anesthesia complications

## 2013-11-02 NOTE — Discharge Instructions (Signed)
DISCHARGE INSTRUCTIONS: HYSTEROSCOPY / ENDOMETRIAL ABLATION °The following instructions have been prepared to help you care for yourself upon your return home. ° °Personal hygiene: °• Use sanitary pads for vaginal drainage, not tampons. °• Shower the day after your procedure. °• NO tub baths, pools or Jacuzzis for 2-3 weeks. °• Wipe front to back after using the bathroom. ° °Activity and limitations: °• Do NOT drive or operate any equipment for 24 hours. The effects of anesthesia are still present °and drowsiness may result. °• Do NOT rest in bed all day. °• Walking is encouraged. °• Walk up and down stairs slowly. °• You may resume your normal activity in one to two days or as indicated by your physician. °Sexual activity: NO intercourse for at least 2 weeks after the procedure, or as indicated by your °Doctor. ° °Diet: Eat a light meal as desired this evening. You may resume your usual diet tomorrow. ° °Return to Work: You may resume your work activities in one to two days or as indicated by your °Doctor. ° °What to expect after your surgery: Expect to have vaginal bleeding/discharge for 2-3 days and °spotting for up to 10 days. It is not unusual to have soreness for up to 1-2 weeks. You may have a °slight burning sensation when you urinate for the first day. Mild cramps may continue for a couple of °days. You may have a regular period in 2-6 weeks. ° °Call your doctor for any of the following: °• Excessive vaginal bleeding or clotting, saturating and changing one pad every hour. °• Inability to urinate 6 hours after discharge from hospital. °• Pain not relieved by pain medication. °• Fever of 100.4° F or greater. °• Unusual vaginal discharge or odor. ° °Post Anesthesia Care Unit [phone] °

## 2013-11-05 ENCOUNTER — Encounter (HOSPITAL_COMMUNITY): Payer: Self-pay | Admitting: Obstetrics and Gynecology

## 2013-11-11 DIAGNOSIS — C541 Malignant neoplasm of endometrium: Secondary | ICD-10-CM

## 2013-11-11 HISTORY — PX: TOTAL ABDOMINAL HYSTERECTOMY: SHX209

## 2013-11-11 HISTORY — DX: Malignant neoplasm of endometrium: C54.1

## 2013-11-16 ENCOUNTER — Encounter: Payer: Self-pay | Admitting: Gynecology

## 2013-11-16 ENCOUNTER — Ambulatory Visit: Payer: PRIVATE HEALTH INSURANCE | Attending: Gynecology | Admitting: Gynecology

## 2013-11-16 DIAGNOSIS — Z8553 Personal history of malignant neoplasm of renal pelvis: Secondary | ICD-10-CM | POA: Insufficient documentation

## 2013-11-16 DIAGNOSIS — Z8673 Personal history of transient ischemic attack (TIA), and cerebral infarction without residual deficits: Secondary | ICD-10-CM | POA: Diagnosis not present

## 2013-11-16 DIAGNOSIS — C541 Malignant neoplasm of endometrium: Secondary | ICD-10-CM | POA: Insufficient documentation

## 2013-11-16 DIAGNOSIS — K219 Gastro-esophageal reflux disease without esophagitis: Secondary | ICD-10-CM | POA: Diagnosis not present

## 2013-11-16 DIAGNOSIS — Z951 Presence of aortocoronary bypass graft: Secondary | ICD-10-CM | POA: Diagnosis not present

## 2013-11-16 DIAGNOSIS — I129 Hypertensive chronic kidney disease with stage 1 through stage 4 chronic kidney disease, or unspecified chronic kidney disease: Secondary | ICD-10-CM | POA: Insufficient documentation

## 2013-11-16 DIAGNOSIS — Z79899 Other long term (current) drug therapy: Secondary | ICD-10-CM | POA: Insufficient documentation

## 2013-11-16 DIAGNOSIS — E785 Hyperlipidemia, unspecified: Secondary | ICD-10-CM | POA: Diagnosis not present

## 2013-11-16 DIAGNOSIS — N183 Chronic kidney disease, stage 3 (moderate): Secondary | ICD-10-CM | POA: Insufficient documentation

## 2013-11-16 DIAGNOSIS — Z7982 Long term (current) use of aspirin: Secondary | ICD-10-CM | POA: Diagnosis not present

## 2013-11-16 DIAGNOSIS — I2510 Atherosclerotic heart disease of native coronary artery without angina pectoris: Secondary | ICD-10-CM | POA: Insufficient documentation

## 2013-11-16 NOTE — Patient Instructions (Signed)
Pre-op appointment with Dr. Clarene Essex at [facility] on November 16 at 10:00am.  Surgery scheduled with Dr. Clarene Essex on November 20.

## 2013-11-16 NOTE — Progress Notes (Signed)
H&P   Dana Jackson 68 y.o. female    Past Medical History  Diagnosis Date  . 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  95% ostial circumflex, 80-90% PDA  . 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 History of unstable angina November/December 2012    T wave inversions in inferolateral leads.  No stress test performed.  . H/O echocardiogram 12/24/10    Mild concentric LVH.  EF greater than 55%, mild aortic sclerosis  . TIA (transient ischemic attack) 1992 &  2010  . Hypertension, essential, benign   . Hyperlipidemia LDL goal <70   . CKD (chronic kidney disease) stage 3, GFR 30-59 ml/min   . Renal cell carcinoma 11/18/2008    T2aNx s/p partial left nephrectomy  . GERD (gastroesophageal reflux disease)   . Arthritis   . History of asthma      childhood  . History of pneumonia     "2-3 times"  . Degenerative disc disease, cervical     Degenerative disc disease, cervical [722.4]  . Stroke     TIA  . Asthma     childhood  . Pneumonia     walking   Past Surgical History  Procedure Laterality Date  . Partial nephrectomy Left 11/18/2008    left partial nephrectomy for renal cell CA  . Anterior cervical decomp/discectomy fusion  10/11/2005    multi-level  . Cardiac catheterization  12/24/10  . Tonsillectomy      "in college"  . Cholecystectomy  ~ 1971  . Tubal ligation  ~ 1984  . Coronary artery bypass graft  12/25/2010    Procedure: CORONARY ARTERY BYPASS GRAFTING (CABGX3 - LIMA-LAD, SVG RPL, SVG-Circumflex);  Surgeon: Rexene Alberts, MD;  Location: [facility];  Service: Open Heart Surgery;  Laterality: N/A;  . Carotid endarterectomy Left   . Colonoscopy    . Upper gi endoscopy    . Wisdom tooth extraction    . Dilatation & curettage/hysteroscopy with myosure N/A 11/02/2013    Procedure: DILATATION & CURETTAGE/HYSTEROSCOPY WITH MYOSURE ABLATION;  Surgeon: Allena Katz,  MD;  Location: [facility];  Service: Gynecology;  Laterality: N/A;   Current Outpatient Prescriptions  Medication Sig Dispense Refill  . aspirin 81 MG tablet Take 81 mg by mouth daily.      [name] atorvastatin (LIPITOR) 40 MG tablet Take 40 mg by mouth at bedtime.     . furosemide (LASIX) 20 MG tablet Take 20 mg by mouth daily.    [name] MAGNESIUM PO Take 400 mg by mouth daily.      . metoprolol (LOPRESSOR) 50 MG tablet Take 25 mg by mouth 2 (two) times daily.    . niacin (NIASPAN) 1000 MG CR tablet Take 1,000-2,000 mg by mouth at bedtime. Alternates 1,000 mg (1 tablet) at night with 2,000 mg (2 tablets) the next night.    [name] omeprazole (PRILOSEC) 40 MG capsule Take 40 mg by mouth daily.      . Probiotic Product (ALIGN PO) Take 1 each by mouth daily.    . Vitamin D, Ergocalciferol, (DRISDOL) 50000 UNITS CAPS capsule Take 50,000 Units by mouth every 7 (seven) days.    [name] loperamide (IMODIUM) 2 MG capsule Take 2 mg by mouth 4 (four) times daily as needed for diarrhea or loose stools.    . nitroGLYCERIN (NITROSTAT) 0.4 MG SL tablet  Place 1 tablet (0.4 mg total) under the tongue every 5 (five) minutes as needed for chest pain. 25 tablet 3   No current facility-administered medications for this visit.   Allergies  Allergen Reactions  . Codeine Nausea Only  . Erythromycin Itching  . Lopid [Gemfibrozil] Itching   History   Social History  . Marital Status: Married    Spouse Name: [name]    Number of Children: 2  . Years of Education: many   Occupational History  . 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     Social History Main Topics  . Smoking status: Never Smoker   . Smokeless tobacco: Never Used  . Alcohol Use: Yes     Comment: "mixed drink once a year"  . Drug Use: No  . Sexual Activity:    Partners: Female   Other Topics Concern  . Not on file   Social History Narrative   She is a married Mother of 2.  -- Currently being very busy taking care of 2 foster  children that are staying with her daughter.  A 58-year-old and a 32-year-old that they're trying to adopt.  She is very excited about the possibility of becoming a Grandmother.   She does walk and getting exercise, but she is wanting to get back into more activities just because she has really been limited due to her arthritic pains. She used to do things like walking and biking, and she may try to do some biking again, or at least stationary biking.    Does not smoke, does not drink.   Family History  Problem Relation Age of Onset  . Coronary artery disease Father   . Coronary artery disease Sister   . Coronary artery disease Brother     Pertinent Gynecological History: patient is seen in consultation request of Dr. Radene Journey regarding management of newly diagnosed endometrial carcinoma. The patient presented with postmenopausal bleeding and ultimately underwent an ultrasound showing a thickened endometrial stripe. 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 (Grade 2) the patient is continued have some spotting. She denies any other constitutional symptoms. She denies any past gynecologic history. Obstetrical history gravida 2.  Patient does have a significant medical history including coronary artery disease, renal cell carcinoma, cervical disc surgery, chronic renal failure andcholecystectomy.  ROS:10 point review systems negative except as noted above Vitals:  There were no vitals taken for this visit.  Physical Exam:general the patient healthy white female no acute distress HEENT is negative  Neck is supple without thyromegaly she does have a cervical disc scar in her anterior neck.  The abdomen is soft nontender no masses organomegaly ascites , hernias, scar noted.  Pelvic exam:  EGBUS normal  Vagina normal but atrophic.  Cervix appears normal, uterus anterior normal shape size and consistency  Adnexa without masses or  nodularity  Rectovaginal exam confirms.    Assessment/Plan  :endometrial carcinoma. We recommend the patient undergo a hysterectomy with bilateral salpingo-oophorectomy and selective pelvic and pararectal lymphadenectomy for surgical staging and treatment. The risks of surgery were outlined including hemorrhage infection injury to adjacent viscera thromboembolic complications and anesthetic risks.  Because her first available operating day in [place] is December 8, we will coordinate surgery at [facility] in [place], [place] 11/16/2013, 12:43 PM

## 2013-11-26 ENCOUNTER — Telehealth: Payer: Self-pay | Admitting: Cardiology

## 2013-11-26 NOTE — Telephone Encounter (Signed)
Dr Charlsie Quest from [facility] is calling for cardiac clearance.  Dana Jackson is scheduled for surgery on Friday, Nov 20 at [facility] for endometrial cancer.  The surgery will be done under general anethesia; it will be a robatic surgery that will last approximately 3 hours. Please fax clearance letter to [phone].  Patient's ASA has already been stopped in preparation for surgery. Will defer to Dr Ellyn Hack for clearance.

## 2013-11-27 NOTE — Telephone Encounter (Signed)
Based on the Revised Cardiac Risk Index, he would be considered a moderate risk patient for a moderate to severe risk surgery (intra-abdominal).  Her risk factors include prior stroke/TIA and high-moderate risk surgery. She does have history of CAD but has been revascularized. 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 She does not have diabetes on insulin.   As she is relatively asymptomatic, I would not recommend any evaluation preoperatively. Would continue on beta blocker and statin for risk modification.  Leonie Man, M.D., M.S. Interventional Cardiologist   Pager # [phone]

## 2013-11-28 ENCOUNTER — Encounter: Payer: Self-pay | Admitting: *Deleted

## 2013-11-28 NOTE — Telephone Encounter (Signed)
RN called left message on voicemail Tresa Garter surgical scheduler Letter was faxed via [facility] for clearance.

## 2013-11-30 DIAGNOSIS — Z8673 Personal history of transient ischemic attack (TIA), and cerebral infarction without residual deficits: Secondary | ICD-10-CM | POA: Insufficient documentation

## 2013-12-19 ENCOUNTER — Encounter (HOSPITAL_COMMUNITY): Payer: Self-pay | Admitting: Cardiology

## 2013-12-24 DIAGNOSIS — Z5111 Encounter for antineoplastic chemotherapy: Secondary | ICD-10-CM | POA: Insufficient documentation

## 2014-04-06 ENCOUNTER — Other Ambulatory Visit (HOSPITAL_COMMUNITY)
Admission: RE | Admit: 2014-04-06 | Discharge: 2014-04-06 | Disposition: A | Discharge status: Home / Self Care | Payer: Medicare Other | Source: Ambulatory Visit | Attending: Gynecologic Oncology | Admitting: Gynecologic Oncology

## 2014-04-06 DIAGNOSIS — C541 Malignant neoplasm of endometrium: Secondary | ICD-10-CM | POA: Diagnosis present

## 2014-04-06 LAB — PLATELET COUNT: Platelets: 14 10*3/uL — CL (ref 150–400)

## 2014-04-07 ENCOUNTER — Other Ambulatory Visit (HOSPITAL_COMMUNITY)
Admission: RE | Admit: 2014-04-07 | Discharge: 2014-04-07 | Disposition: A | Discharge status: Home / Self Care | Payer: Medicare Other | Source: Ambulatory Visit | Attending: Gynecologic Oncology | Admitting: Gynecologic Oncology

## 2014-04-07 DIAGNOSIS — C649 Malignant neoplasm of unspecified kidney, except renal pelvis: Secondary | ICD-10-CM | POA: Insufficient documentation

## 2014-04-07 DIAGNOSIS — E785 Hyperlipidemia, unspecified: Secondary | ICD-10-CM | POA: Diagnosis present

## 2014-04-07 LAB — PLATELET COUNT: PLATELETS: 17 10*3/uL — AB (ref 150–400)

## 2014-06-10 ENCOUNTER — Encounter (HOSPITAL_COMMUNITY): Payer: Self-pay | Admitting: *Deleted

## 2014-06-10 ENCOUNTER — Emergency Department (HOSPITAL_COMMUNITY)
Admission: EM | Admit: 2014-06-10 | Discharge: 2014-06-10 | Disposition: A | Discharge status: Other Acute Inpatient Hospital | Payer: Medicare Other | Attending: Emergency Medicine | Admitting: Emergency Medicine

## 2014-06-10 ENCOUNTER — Emergency Department (HOSPITAL_COMMUNITY): Payer: Medicare Other

## 2014-06-10 DIAGNOSIS — Z7982 Long term (current) use of aspirin: Secondary | ICD-10-CM | POA: Diagnosis not present

## 2014-06-10 DIAGNOSIS — E785 Hyperlipidemia, unspecified: Secondary | ICD-10-CM | POA: Diagnosis not present

## 2014-06-10 DIAGNOSIS — K219 Gastro-esophageal reflux disease without esophagitis: Secondary | ICD-10-CM | POA: Insufficient documentation

## 2014-06-10 DIAGNOSIS — I25119 Atherosclerotic heart disease of native coronary artery with unspecified angina pectoris: Secondary | ICD-10-CM | POA: Diagnosis not present

## 2014-06-10 DIAGNOSIS — Z951 Presence of aortocoronary bypass graft: Secondary | ICD-10-CM | POA: Diagnosis not present

## 2014-06-10 DIAGNOSIS — Z8701 Personal history of pneumonia (recurrent): Secondary | ICD-10-CM | POA: Insufficient documentation

## 2014-06-10 DIAGNOSIS — C541 Malignant neoplasm of endometrium: Secondary | ICD-10-CM

## 2014-06-10 DIAGNOSIS — Z79899 Other long term (current) drug therapy: Secondary | ICD-10-CM | POA: Insufficient documentation

## 2014-06-10 DIAGNOSIS — D649 Anemia, unspecified: Secondary | ICD-10-CM | POA: Diagnosis not present

## 2014-06-10 DIAGNOSIS — M1990 Unspecified osteoarthritis, unspecified site: Secondary | ICD-10-CM | POA: Diagnosis not present

## 2014-06-10 DIAGNOSIS — I129 Hypertensive chronic kidney disease with stage 1 through stage 4 chronic kidney disease, or unspecified chronic kidney disease: Secondary | ICD-10-CM | POA: Diagnosis not present

## 2014-06-10 DIAGNOSIS — D696 Thrombocytopenia, unspecified: Secondary | ICD-10-CM | POA: Insufficient documentation

## 2014-06-10 DIAGNOSIS — N183 Chronic kidney disease, stage 3 (moderate): Secondary | ICD-10-CM | POA: Diagnosis not present

## 2014-06-10 DIAGNOSIS — Z8673 Personal history of transient ischemic attack (TIA), and cerebral infarction without residual deficits: Secondary | ICD-10-CM | POA: Insufficient documentation

## 2014-06-10 DIAGNOSIS — J45909 Unspecified asthma, uncomplicated: Secondary | ICD-10-CM | POA: Diagnosis not present

## 2014-06-10 DIAGNOSIS — R110 Nausea: Secondary | ICD-10-CM | POA: Diagnosis present

## 2014-06-10 DIAGNOSIS — R509 Fever, unspecified: Secondary | ICD-10-CM

## 2014-06-10 HISTORY — DX: Malignant neoplasm of endometrium: C54.1

## 2014-06-10 LAB — URINALYSIS, ROUTINE W REFLEX MICROSCOPIC
Bilirubin Urine: NEGATIVE
Glucose, UA: NEGATIVE mg/dL
Ketones, ur: NEGATIVE mg/dL
Nitrite: NEGATIVE
PROTEIN: 100 mg/dL — AB
Specific Gravity, Urine: 1.008 (ref 1.005–1.030)
Urobilinogen, UA: 0.2 mg/dL (ref 0.0–1.0)
pH: 5.5 (ref 5.0–8.0)

## 2014-06-10 LAB — COMPREHENSIVE METABOLIC PANEL
ALK PHOS: 101 U/L (ref 38–126)
ALT: 33 U/L (ref 14–54)
AST: 33 U/L (ref 15–41)
Albumin: 2.6 g/dL — ABNORMAL LOW (ref 3.5–5.0)
Anion gap: 10 (ref 5–15)
BILIRUBIN TOTAL: 0.8 mg/dL (ref 0.3–1.2)
BUN: 55 mg/dL — AB (ref 6–20)
CALCIUM: 8.5 mg/dL — AB (ref 8.9–10.3)
CHLORIDE: 102 mmol/L (ref 101–111)
CO2: 19 mmol/L — AB (ref 22–32)
CREATININE: 3.22 mg/dL — AB (ref 0.44–1.00)
GFR calc Af Amer: 16 mL/min — ABNORMAL LOW (ref >60)
GFR calc non Af Amer: 14 mL/min — ABNORMAL LOW (ref >60)
GLUCOSE: 233 mg/dL — AB (ref 65–99)
Potassium: 4.4 mmol/L (ref 3.5–5.1)
Sodium: 131 mmol/L — ABNORMAL LOW (ref 135–145)
TOTAL PROTEIN: 5.5 g/dL — AB (ref 6.5–8.1)

## 2014-06-10 LAB — CBC WITH DIFFERENTIAL/PLATELET
BASOS ABS: 0 10*3/uL (ref 0.0–0.1)
BASOS PCT: 0 % (ref 0–1)
EOS PCT: 0 % (ref 0–5)
Eosinophils Absolute: 0 10*3/uL (ref 0.0–0.7)
HCT: 20.4 % — ABNORMAL LOW (ref 36.0–46.0)
Hemoglobin: 7.1 g/dL — ABNORMAL LOW (ref 12.0–15.0)
Lymphocytes Relative: 8 % — ABNORMAL LOW (ref 12–46)
Lymphs Abs: 0.4 10*3/uL — ABNORMAL LOW (ref 0.7–4.0)
MCH: 33.3 pg (ref 26.0–34.0)
MCHC: 34.8 g/dL (ref 30.0–36.0)
MCV: 95.8 fL (ref 78.0–100.0)
Monocytes Absolute: 0.7 10*3/uL (ref 0.1–1.0)
Monocytes Relative: 14 % — ABNORMAL HIGH (ref 3–12)
Neutro Abs: 3.7 10*3/uL (ref 1.7–7.7)
Neutrophils Relative %: 78 % — ABNORMAL HIGH (ref 43–77)
Platelets: 105 10*3/uL — ABNORMAL LOW (ref 150–400)
RBC: 2.13 MIL/uL — AB (ref 3.87–5.11)
WBC: 4.8 10*3/uL (ref 4.0–10.5)

## 2014-06-10 LAB — I-STAT CG4 LACTIC ACID, ED
LACTIC ACID, VENOUS: 1.56 mmol/L (ref 0.5–2.0)
LACTIC ACID, VENOUS: 0.93 mmol/L (ref 0.5–2.0)

## 2014-06-10 LAB — URINE MICROSCOPIC-ADD ON

## 2014-06-10 MED ORDER — VANCOMYCIN HCL IN DEXTROSE 1-5 GM/200ML-% IV SOLN: 1000.0000 mg | INTRAVENOUS | Status: DC

## 2014-06-10 MED ORDER — ACETAMINOPHEN 325 MG PO TABS
650.0000 mg | ORAL_TABLET | Freq: Once | ORAL | Status: AC
  Administered 2014-06-10: 650 mg via ORAL
  Filled 2014-06-10: qty 2

## 2014-06-10 MED ORDER — SODIUM CHLORIDE 0.9 % IV BOLUS (SEPSIS)
2040.0000 mL | Freq: Once | INTRAVENOUS | Status: AC
  Administered 2014-06-10: 2040 mL via INTRAVENOUS

## 2014-06-10 MED ORDER — PIPERACILLIN-TAZOBACTAM 3.375 G IVPB
3.3750 g | INTRAVENOUS | Status: DC
  Administered 2014-06-10: 3.375 g via INTRAVENOUS
  Filled 2014-06-10: qty 50

## 2014-06-10 MED ORDER — PIPERACILLIN-TAZOBACTAM IN DEX 2-0.25 GM/50ML IV SOLN
2.2500 g | Freq: Three times a day (TID) | INTRAVENOUS | Status: DC
  Filled 2014-06-10 (×2): qty 50

## 2014-06-10 MED ORDER — PIPERACILLIN-TAZOBACTAM 3.375 G IVPB 30 MIN: 3.3750 g | Freq: Once | INTRAVENOUS | Status: DC

## 2014-06-10 MED ORDER — VANCOMYCIN HCL IN DEXTROSE 1-5 GM/200ML-% IV SOLN
1000.0000 mg | INTRAVENOUS | Status: AC
Start: 2014-06-10 — End: 2014-06-10
  Administered 2014-06-10: 1000 mg via INTRAVENOUS
  Filled 2014-06-10: qty 200

## 2014-06-10 NOTE — ED Notes (Signed)
Dr. Wentz at the bedside.  

## 2014-06-10 NOTE — ED Notes (Addendum)
Pt states her last chemo treatment was April 18th but her platelets kept dropping and it took a while for them to come up. Her next chemo treatment is scheduled for tomorrow.

## 2014-06-10 NOTE — ED Provider Notes (Signed)
CSN: [id number]     Arrival date & time 06/10/14  0817 History   First MD Initiated Contact with Patient 06/10/14 0915     Chief Complaint  Patient presents with  . Nausea     (Consider location/radiation/quality/duration/timing/severity/associated sxs/prior Treatment) HPI   Dana Jackson is a 69 y.o. female who presents for evaluation of chills. He has had chills for 2 days, with documented fever yesterday. Her last chemotherapy was about 6 weeks ago. Her recent treatment course has been complicated by periods of low white blood cell count and low platelets. Her counts are checked last week and were satisfactory, so she is scheduled for chemotherapy, tomorrow at [facility], [place]. She is being treated for endometrial cancer. She has not had cough, dysuria, diarrhea, noted sores or other sources for fever. No known sick contacts. She is taking her medications as prescribed. There are no other known modifying factors.   Past Medical History  Diagnosis Date  . 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  95% ostial circumflex, 80-90% PDA  . 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 History of unstable angina November/December 2012    T wave inversions in inferolateral leads.  No stress test performed.  . H/O echocardiogram 12/24/10    Mild concentric LVH.  EF greater than 55%, mild aortic sclerosis  . TIA (transient ischemic attack) 1992 &  2010  . Hypertension, essential, benign   . Hyperlipidemia LDL goal <70   . CKD (chronic kidney disease) stage 3, GFR 30-59 ml/min   . GERD (gastroesophageal reflux disease)   . Arthritis   . History of asthma      childhood  . History of pneumonia     "2-3 times"  . Degenerative disc disease, cervical     Degenerative disc disease, cervical [722.4]  . Stroke     TIA  . Asthma     childhood  . Pneumonia     walking  . Renal cell  carcinoma 11/18/2008    T2aNx s/p partial left nephrectomy  . Endometrial cancer    Past Surgical History  Procedure Laterality Date  . Partial nephrectomy Left 11/18/2008    left partial nephrectomy for renal cell CA  . Anterior cervical decomp/discectomy fusion  10/11/2005    multi-level  . Cardiac catheterization  12/24/10  . Tonsillectomy      "in college"  . Cholecystectomy  ~ 1971  . Tubal ligation  ~ 1984  . Coronary artery bypass graft  12/25/2010    Procedure: CORONARY ARTERY BYPASS GRAFTING (CABGX3 - LIMA-LAD, SVG RPL, SVG-Circumflex);  Surgeon: Rexene Alberts, MD;  Location: [facility];  Service: Open Heart Surgery;  Laterality: N/A;  . Carotid endarterectomy Left   . Colonoscopy    . Upper gi endoscopy    . Wisdom tooth extraction    . Dilatation & curettage/hysteroscopy with myosure N/A 11/02/2013    Procedure: DILATATION & CURETTAGE/HYSTEROSCOPY WITH MYOSURE ABLATION;  Surgeon: Allena Katz, MD;  Location: [facility];  Service: Gynecology;  Laterality: N/A;  . Left heart catheterization with coronary angiogram N/A 12/24/2010    Procedure: LEFT HEART CATHETERIZATION WITH CORONARY ANGIOGRAM;  Surgeon: Leonie Man, MD;  Location: [facility] CATH LAB;  Service: Cardiovascular;  Laterality: N/A;   Family History  Problem Relation Age of Onset  . Coronary artery disease Father   .  Coronary artery disease Sister   . Coronary artery disease Brother    History  Substance Use Topics  . Smoking status: Never Smoker   . Smokeless tobacco: Never Used  . Alcohol Use: Yes     Comment: "mixed drink once a year"   OB History    No data available     Review of Systems  All other systems reviewed and are negative.     Allergies  Codeine; Erythromycin; and Lopid  Home Medications   Prior to Admission medications   Medication Sig Start Date End Date Taking? Authorizing Provider  aspirin 81 MG tablet Take 81 mg by mouth daily.      Historical Provider, MD  atorvastatin  (LIPITOR) 40 MG tablet Take 40 mg by mouth at bedtime.     Historical Provider, MD  furosemide (LASIX) 20 MG tablet Take 20 mg by mouth daily.    Historical Provider, MD  loperamide (IMODIUM) 2 MG capsule Take 2 mg by mouth 4 (four) times daily as needed for diarrhea or loose stools.    Historical Provider, MD  MAGNESIUM PO Take 400 mg by mouth daily.      Historical Provider, MD  metoprolol (LOPRESSOR) 50 MG tablet Take 25 mg by mouth 2 (two) times daily.    Historical Provider, MD  niacin (NIASPAN) 1000 MG CR tablet Take 1,000-2,000 mg by mouth at bedtime. Alternates 1,000 mg (1 tablet) at night with 2,000 mg (2 tablets) the next night.    Historical Provider, MD  nitroGLYCERIN (NITROSTAT) 0.4 MG SL tablet Place 1 tablet (0.4 mg total) under the tongue every 5 (five) minutes as needed for chest pain. 09/03/13   Leonie Man, MD  omeprazole (PRILOSEC) 40 MG capsule Take 40 mg by mouth daily.      Historical Provider, MD  Probiotic Product (ALIGN PO) Take 1 each by mouth daily.    Historical Provider, MD  Vitamin D, Ergocalciferol, (DRISDOL) 50000 UNITS CAPS capsule Take 50,000 Units by mouth every 7 (seven) days.    Historical Provider, MD   BP 111/54 mmHg  Pulse 125  Temp(Src) 103.8 F (39.9 C) (Rectal)  Resp 21  Wt 150 lb 1.6 oz (68.085 kg)  SpO2 96% Physical Exam  Constitutional: She is oriented to person, place, and time. She appears well-developed.  Elderly, frail  HENT:  Head: Normocephalic and atraumatic.  Right Ear: External ear normal.  Left Ear: External ear normal.  Eyes: Conjunctivae and EOM are normal. Pupils are equal, round, and reactive to light.  Neck: Normal range of motion and phonation normal. Neck supple.  Cardiovascular: Normal rate, regular rhythm and normal heart sounds.   Port-A-Cath site, upper right chest wall appears normal.  Pulmonary/Chest: Effort normal and breath sounds normal. She exhibits no bony tenderness.  Abdominal: Soft. There is no tenderness.   Musculoskeletal: Normal range of motion.  Neurological: She is alert and oriented to person, place, and time. No cranial nerve deficit or sensory deficit. She exhibits normal muscle tone. Coordination normal.  She is somewhat confused regarding historical responses. She is responsive and cooperative.  Skin: Skin is warm, dry and intact. There is pallor.  Psychiatric: She has a normal mood and affect. Her behavior is normal. Judgment and thought content normal.  Nursing note and vitals reviewed.   ED Course  Procedures (including critical care time)  Medications  vancomycin (VANCOCIN) IVPB 1000 mg/200 mL premix (1,000 mg Intravenous New Bag/Given 06/10/14 1334)  piperacillin-tazobactam (ZOSYN) IVPB 2.25 g (  not administered)  vancomycin (VANCOCIN) IVPB 1000 mg/200 mL premix (not administered)  piperacillin-tazobactam (ZOSYN) IVPB 3.375 g (not administered)  acetaminophen (TYLENOL) tablet 650 mg (650 mg Oral Given 06/10/14 0930)  sodium chloride 0.9 % bolus 2,040 mL (0 mLs Intravenous Stopped 06/10/14 1208)    Patient Vitals for the past 24 hrs:  BP Temp Temp src Pulse Resp SpO2 Weight  06/10/14 1330 (!) 102/52 mmHg - - 90 18 99 % -  06/10/14 1315 101/57 mmHg - - 91 17 94 % -  06/10/14 1310 (!) 115/54 mmHg 99.9 F (37.7 C) Rectal - 19 98 % -  06/10/14 1300 (!) 117/54 mmHg - - 91 17 93 % -  06/10/14 1259 - 98 F (36.7 C) Oral - - - -  06/10/14 1245 114/55 mmHg - - 95 19 98 % -  06/10/14 1230 113/56 mmHg - - 92 21 93 % -  06/10/14 1215 (!) 110/52 mmHg - - 93 19 97 % -  06/10/14 1200 (!) 113/53 mmHg - - 96 18 92 % -  06/10/14 1145 (!) 95/37 mmHg - - 94 19 98 % -  06/10/14 1130 (!) 99/50 mmHg - - 95 20 96 % -  06/10/14 1015 (!) 112/41 mmHg - - 108 20 97 % -  06/10/14 1000 (!) 108/40 mmHg - - 109 20 93 % -  06/10/14 0945 (!) 123/45 mmHg - - 111 19 99 % -  06/10/14 0930 99/75 mmHg - - 113 18 96 % -  06/10/14 0916 - (!) 103.8 F (39.9 C) Rectal - - - -  06/10/14 0915 (!) 125/51 mmHg - -  116 (!) 30 97 % -  06/10/14 0902 (!) 111/54 mmHg 102.7 F (39.3 C) Oral - 21 96 % -  06/10/14 0853 - - - - - - 150 lb 1.6 oz (68.085 kg)  06/10/14 0824 (!) 134/47 mmHg 98.5 F (36.9 C) Oral (!) 125 26 96 % 150 lb (68.04 kg)    13:55- . I discussed case with Dr. Cindie Laroche, her gynecologic oncologist, at [facility] in [place], [place]. He would like her transferred there for admission.   2:04 PM Reevaluation with update and discussion. After initial assessment and treatment, an updated evaluation reveals clinical status unchanged. Family and patient updated on findings and plan. They are agreeable. All questions answered.Daleen Bo L   CRITICAL CARE Performed by: Richarda Blade Total critical care time: 40 minutes Critical care time was exclusive of separately billable procedures and treating other patients. Critical care was necessary to treat or prevent imminent or life-threatening deterioration. Critical care was time spent personally by me on the following activities: development of treatment plan with patient and/or surrogate as well as nursing, discussions with consultants, evaluation of patient's response to treatment, examination of patient, obtaining history from patient or surrogate, ordering and performing treatments and interventions, ordering and review of laboratory studies, ordering and review of radiographic studies, pulse oximetry and re-evaluation of patient's condition.   Labs Review Labs Reviewed  COMPREHENSIVE METABOLIC PANEL - Abnormal; Notable for the following:    Sodium 131 (*)    CO2 19 (*)    Glucose, Bld 233 (*)    BUN 55 (*)    Creatinine, Ser 3.22 (*)    Calcium 8.5 (*)    Total Protein 5.5 (*)    Albumin 2.6 (*)    GFR calc non Af Amer 14 (*)    GFR calc Af Amer 16 (*)  All other components within normal limits  CBC WITH DIFFERENTIAL/PLATELET - Abnormal; Notable for the following:    RBC 2.13 (*)    Hemoglobin 7.1 (*)    HCT  20.4 (*)    Platelets 105 (*)    Neutrophils Relative % 78 (*)    Lymphocytes Relative 8 (*)    Monocytes Relative 14 (*)    Lymphs Abs 0.4 (*)    All other components within normal limits  URINE CULTURE  URINALYSIS, ROUTINE W REFLEX MICROSCOPIC (NOT AT [facility])  I-STAT CG4 LACTIC ACID, ED   Blood cultures were obtained from her Porta-cath site, 5 minutes apart.   Labs from [facility]: 05/24/2014 05/20/2014 05/14/2014 04/29/2014 04/04/2014 04/03/2014 04/03/2014 04/02/2014 02/04/2014 01/08/2014 12/01/2013 11/26/2013    3.7 (L) 4.9 3.2 (L) 4.2 (L) 4.2 (L) 4.6 4.1 (L) 4.3 Fever and cancer patient. She is not neutropenic. Fever in neutropenic patient(L)  12.5 (H) 12.2 (H) 6  2.87 (L) 2.85 (L) 2.18 (L) 2.71 (L) 2.85 (L) 2.87 (L) 2.45 (L) 2.3 (L)  3.83 (L) 3.74 (L) 4.34  9.5 (L) 9.4 (L) 7.6 (L) 9.1 (L) 8.9 (L) 9 (L) 7.7 (L) 7.5 (L)  11.8 (L) 11.7 (L) 13.6  27 (L) 27 (L) 22 (L) 27 (L) 27.2 (L) 27 (L) 22.2 (L) 22.6 (L)  35.6 (L) 34.6 (L) 40.3  94 94.6 101.2 (H) 99.8 95 94 91 98  93 93 93  33 33.1 34.9 (H) 33.7 31 31 31  33  31 31 31   35.1 35 34.5 33.8 33 33 35 33  33 34 34  22 (H) 21.5 (H) 20.7 (H) 20.9 (H) 17 (H) 16.9 (H) 18 (H) 16.8 (H)  12.9 12.6 13  8.6 8.5 7.9 7.9 10 10.7 (H) 9.4 9.5  7.5 6.9 (L) 7.3  30 (L) 31 (L) 54 (L) 124 (L) 20 (L) 20 (L) 25 (L) 43 (L) 60 (L) 196 159 165                    Imaging Review No results found.   EKG Interpretation None      MDM   Final diagnoses:  Febrile illness  Endometrial cancer  Anemia, unspecified anemia type  Thrombocytopenia    Fever and cancer patient. No neutropenia, normal lactate. Creatinine is elevated, from baseline. This indicates volume depletion. Doubt severe sepsis, metabolic instability or impending vascular collapse. Patient is complex and will require close monitoring. Her oncologist recommends transferring the patient to his facility in [place], [place]. Patient and husband are agreeable with this plan.  Nursing Notes  Reviewed/ Care Coordinated, and agree without changes. Applicable Imaging Reviewed.  Interpretation of Laboratory Data incorporated into ED treatment   Plan: Transfer to [facility], to admit for treatment    Daleen Bo, MD 06/10/14 1551

## 2014-06-10 NOTE — Progress Notes (Signed)
ANTIBIOTIC CONSULT NOTE - INITIAL  Pharmacy Consult for Vanco/Zosyn Indication: rule out sepsis  Allergies  Allergen Reactions  . Codeine Nausea Only  . Erythromycin Itching  . Lopid [Gemfibrozil] Itching    Patient Measurements: Weight: 150 lb 1.6 oz (68.085 kg) Adjusted Body Weight:   Vital Signs: Temp: 98 F (36.7 C) (05/30 1259) Temp Source: Oral (05/30 1259) BP: 114/55 mmHg (05/30 1245) Pulse Rate: 95 (05/30 1245) Intake/Output from previous day:   Intake/Output from this shift: Total I/O In: 2040 [I.V.:2040] Out: -   Labs:  Recent Labs  06/10/14 0830  WBC 4.8  HGB 7.1*  PLT 105*  CREATININE 3.22*   CrCl cannot be calculated (Unknown ideal weight.). No results for input(s): VANCOTROUGH, VANCOPEAK, VANCORANDOM, GENTTROUGH, GENTPEAK, GENTRANDOM, TOBRATROUGH, TOBRAPEAK, TOBRARND, AMIKACINPEAK, AMIKACINTROU, AMIKACIN in the last 72 hours.   Microbiology: No results found for this or any previous visit (from the past 720 hour(s)).  Medical History: Past Medical History  Diagnosis Date  . 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  95% ostial circumflex, 80-90% PDA  . 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 History of unstable angina November/December 2012    T wave inversions in inferolateral leads.  No stress test performed.  . H/O echocardiogram 12/24/10    Mild concentric LVH.  EF greater than 55%, mild aortic sclerosis  . TIA (transient ischemic attack) 1992 &  2010  . Hypertension, essential, benign   . Hyperlipidemia LDL goal <70   . CKD (chronic kidney disease) stage 3, GFR 30-59 ml/min   . GERD (gastroesophageal reflux disease)   . Arthritis   . History of asthma      childhood  . History of pneumonia     "2-3 times"  . Degenerative disc disease, cervical     Degenerative disc disease, cervical [722.4]  . Stroke     TIA  . Asthma    childhood  . Pneumonia     walking  . Renal cell carcinoma 11/18/2008    T2aNx s/p partial left nephrectomy  . Endometrial cancer     Medications: see med rec  Assessment: 69 y/o F c/o nausea for several days, chills, fever . Patient is immunocompromised from receiving chemotherapy (last 4/18, next 5/31) for endometrial cancer.   ID: r/o sepsis. Scr 3.22 (calculated CrCl 17). WBC 4.8. LA 0.94.Temp 103.8 Vanco 5/30>> Zosyn 5/30>>  Heme/Onc: Endometrial Cancer. Receives chemo in [place]. Plts currently 105. Hgb low at 7.1.   Goal of Therapy:  Vancomycin trough level 15-20 mcg/ml  Plan:  Zosyn 2.25g IV q8hr. Vancomycin 1g IV q48h. Trough at steady state.  Dana Jackson, PharmD, BCPS Clinical Staff Pharmacist Pager [phone]  Dana Jackson 06/10/2014,1:00 PM

## 2014-06-10 NOTE — ED Notes (Signed)
amb to the bathroom for urine specimen

## 2014-06-10 NOTE — ED Notes (Signed)
Pt in c/o nausea for the last few days, pt is receiving chemo and was told to come in for evaluation for the nausea, denies vomiting, pt appears pale in triage, mucous membranes dry

## 2014-06-10 NOTE — ED Notes (Signed)
Ambulatory to bathroom for urine specimen

## 2014-06-10 NOTE — ED Notes (Signed)
Walked by pts room and found pt anxious, diaphoretic, HR on monitor 170, sats 93%, incontinent of urine. Primary RN into room and explained she gave pt Tylenol an hour prior for 101 temp. Temp rechecked, 102.5 oral. Sheets and gown changed. Ice packs to axilla. 2 L O2 [place] placed. Pt calmed down, skin minimally moist. Denies pain or SOB. Unable to tell this RN why she was rolling from side to side in the stretcher on first encounter. Moving pt back to Acute Side while wait for transport to [facility]

## 2014-06-10 NOTE — ED Notes (Signed)
Bed is ready at [facility]. Pt will be going to [phone]

## 2014-06-10 NOTE — ED Notes (Signed)
Pt unable to provide a specimen at this time.

## 2014-06-12 LAB — CULTURE, BLOOD (ROUTINE X 2)

## 2014-06-12 LAB — URINE CULTURE: Colony Count: >=100000

## 2014-06-13 ENCOUNTER — Telehealth (HOSPITAL_BASED_OUTPATIENT_CLINIC_OR_DEPARTMENT_OTHER): Payer: Self-pay | Admitting: Emergency Medicine

## 2014-06-13 DIAGNOSIS — R9439 Abnormal result of other cardiovascular function study: Secondary | ICD-10-CM | POA: Insufficient documentation

## 2014-06-13 DIAGNOSIS — R7881 Bacteremia: Secondary | ICD-10-CM | POA: Insufficient documentation

## 2014-06-13 HISTORY — PX: TRANSTHORACIC ECHOCARDIOGRAM: SHX275

## 2014-06-13 HISTORY — PX: OTHER SURGICAL HISTORY: SHX169

## 2014-06-13 NOTE — Telephone Encounter (Signed)
Post ED Visit - Positive Culture Follow-up  Culture report reviewed by antimicrobial stewardship pharmacist: []  Wes Dulaney, Pharm.D., BCPS []  Heide Guile, Pharm.D., BCPS [x]  Alycia Rossetti, Pharm.D., BCPS []  [employer], Pharm.D., BCPS, AAHIVP []  Legrand Como, Pharm.D., BCPS, AAHIVP []  Isac Sarna, Pharm.  Blood and urine culture results called to Josephina Gip @ 0900  06/11/14 according to notes  Hazle Nordmann 06/13/2014, 1:21 PM

## 2014-06-14 DIAGNOSIS — Z09 Encounter for follow-up examination after completed treatment for conditions other than malignant neoplasm: Secondary | ICD-10-CM | POA: Insufficient documentation

## 2014-06-18 ENCOUNTER — Encounter: Payer: Self-pay | Admitting: Cardiology

## 2014-06-18 ENCOUNTER — Ambulatory Visit (INDEPENDENT_AMBULATORY_CARE_PROVIDER_SITE_OTHER): Payer: Medicare Other | Admitting: Cardiology

## 2014-06-18 VITALS — BP 116/68 | HR 77 | Ht 60.0 in | Wt 148.9 lb

## 2014-06-18 DIAGNOSIS — I10 Essential (primary) hypertension: Secondary | ICD-10-CM | POA: Diagnosis not present

## 2014-06-18 DIAGNOSIS — I2510 Atherosclerotic heart disease of native coronary artery without angina pectoris: Secondary | ICD-10-CM | POA: Diagnosis not present

## 2014-06-18 DIAGNOSIS — E785 Hyperlipidemia, unspecified: Secondary | ICD-10-CM

## 2014-06-18 DIAGNOSIS — Z951 Presence of aortocoronary bypass graft: Secondary | ICD-10-CM

## 2014-06-18 DIAGNOSIS — R931 Abnormal findings on diagnostic imaging of heart and coronary circulation: Secondary | ICD-10-CM

## 2014-06-18 DIAGNOSIS — D689 Coagulation defect, unspecified: Secondary | ICD-10-CM

## 2014-06-18 DIAGNOSIS — R9439 Abnormal result of other cardiovascular function study: Secondary | ICD-10-CM

## 2014-06-18 DIAGNOSIS — Z79899 Other long term (current) drug therapy: Secondary | ICD-10-CM

## 2014-06-18 MED ORDER — METOPROLOL TARTRATE 50 MG PO TABS: 25.0000 mg | ORAL_TABLET | Freq: Two times a day (BID) | ORAL | Status: DC

## 2014-06-18 NOTE — Progress Notes (Signed)
PCP: Stephens Shire, MD Nephrologist: Dr. Joelyn Oms  Clinic Note: Chief Complaint  Patient presents with  . Hospitalization Follow-up    swelling bilateral feet swelling,stress test results,  . Coronary Artery Disease    Abnormal nuclear stress test   HPI: Dana Jackson is a 69 y.o. female with a PMH of prior TIA and carotid directly as well as partial nephrectomy from renal cell carcinoma with residual CK D-3B who presents today for followup of her CAD status post CABG. Was @ [facility] for Endometrial Ca Rx Followed by urosepsis-- had Nuc ST. Has subsequently developed chronic renal insufficiency with a creatinine (related to ATN) of roughly 2.7-3.0. Back in March the creatinine was 1.7-1.8 range  Since I last saw her, she has been treated at [facility] for endometrial cancer status post TAH/BSO followed by chemotherapy and radiation. Her chemotherapy. The fourth round. She was then readmitted to the hospital recently in early June with riders and chills. She was eventually diagnosed with bacteremia with pansensitive Escherichia coli. He was not sure what the source was. She is just now completing a perianal buttocks at the end of this week to beginning of next week. Part of her evaluation in the hospital, she had a PET Myoview stress test done to evaluate her for potential ischemic reason for her symptoms. Apparently she had mildly elevated troponin levels. This study stress test showed a reversible defect in the anterior wall consistent with ischemia. Thought was that she would likely need cardiac catheterization to delineate her anatomy. This was not done in the hospital because of acute on chronic kidney injury. They also want her to see 48 hours negative blood cultures. She now presents here today with the results of her stress test. She has not been febrile or had any right wrist since. She is finally now starting to get back some energy.  Interval History: From a cardiac standpoint, she  really denies having any symptoms. She doesn't really remember much at all about the recent hospitalization with bacteremia/septic shock. She doesn't recall having any evidence of any chest tightness or pressure. Since then, she has not had any anginal symptoms with rest or exertion. No heart failure symptoms of PND, orthopnea,but does have some intermittent lower extremity edema that is chronic. Usually in her feet. Remainder of her Cardiovascular ROS is as follows:  No palpitations, weakness or syncope/near syncope.  No TIA/amaurosis fugax symptoms.  No melena, hematochezia, hematuria, or epstaxis.  No claudication.  She still has some occasional lightheadedness and dizziness that is positional most. If she overdoes her exercise she will get short of breath.  Past Medical History  Diagnosis Date  . 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  95% ostial circumflex, 80-90% PDA  . 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 History of unstable angina November/December 2012    T wave inversions in inferolateral leads.  No stress test performed.  [name] TIA (transient ischemic attack) 1992 &  2010  . Hypertension, essential, benign   . Hyperlipidemia LDL goal <70   . CKD (chronic kidney disease) stage 3, GFR 30-59 ml/min   . GERD (gastroesophageal reflux disease)   . Arthritis   . History of asthma      childhood  . History of pneumonia     "2-3 times"  . Degenerative disc disease, cervical  Degenerative disc disease, cervical [722.4]  . Stroke     TIA  . Asthma     childhood  . Pneumonia     walking  . Renal cell carcinoma 11/18/2008    T2aNx s/p partial left nephrectomy  . Endometrial cancer November 2015    Treated with TAH with pelvic lymphadenectomy followed by radiation and chemotherapy  . Bacteremia due to Escherichia coli June 2015     Currently being treated with  anti-biotics   Past Surgical History  Procedure Laterality Date  . Partial nephrectomy Left 11/18/2008    left partial nephrectomy for renal cell CA  . Anterior cervical decomp/discectomy fusion  10/11/2005    multi-level  . Cardiac catheterization  12/24/10    40% left main, 80% mid LAD, 95% ostial circumflex, 80-90% PDA.  [name] Tonsillectomy      "in college"  . Cholecystectomy  ~ 1971  . Tubal ligation  ~ 1984  . Coronary artery bypass graft  12/25/2010    Procedure: CORONARY ARTERY BYPASS GRAFTING (CABGX3 - LIMA-LAD, SVG RPL, SVG-Circumflex);  Surgeon: Rexene Alberts, MD;  Location: [place];  Service: Open Heart Surgery;  Laterality: N/A;  . Carotid endarterectomy Left   . Colonoscopy    . Upper gi endoscopy    . Wisdom tooth extraction    . Dilatation & curettage/hysteroscopy with myosure N/A 11/02/2013    Procedure: DILATATION & CURETTAGE/HYSTEROSCOPY WITH MYOSURE ABLATION;  Surgeon: Allena Katz, MD;  Location: [facility];  Service: Gynecology;  Laterality: N/A;  . Left heart catheterization with coronary angiogram N/A 12/24/2010    Procedure: LEFT HEART CATHETERIZATION WITH CORONARY ANGIOGRAM;  Surgeon: Leonie Man, MD;  Location: [facility] CATH LAB;  Service: Cardiovascular;  Laterality: N/A;  . Total abdominal hysterectomy  November 2015     At [facility]: Robotic procedure with pelvic lymphadenectomy  . Transthoracic echocardiogram  06/13/2014    At [place]: EF 60-65%. Grade 1 diastolic dysfunction. Mild MR. Aortic sclerosis. Moderate pulmonary hypertension  . Pet myocardial perfusion scan  06/13/2014    At [place]: Moderate size, mild severity completely reversible defect involving the basal anterior, mid anterior and apical anterior segments consistent with ischemia. EF 65% with normal global function.    Recent Cardiac  Evaluation: From [facility] 2nd  Echocardiogram: EF 60-65%. Grade 1 diastolic dysfunction. Mild MR. Aortic sclerosis. Moderate pulmonary  hypertension.  PET Myocardial Perfusion Scan:  Moderate size, mild severity completely reversible defect involving the basal anterior, mid anterior and apical anterior segments consistent with ischemia. EF 65% with normal global function.   Current Outpatient Prescriptions on File Prior to Visit  Medication Sig Dispense Refill  . Alpha-Lipoic Acid 300 MG TABS Take 600 mg by mouth daily.    [name] aspirin 81 MG tablet Take 81 mg by mouth daily.      [name] atorvastatin (LIPITOR) 40 MG tablet Take 40 mg by mouth at bedtime.     . docusate sodium (COLACE) 100 MG capsule Take 100 mg by mouth daily as needed for mild constipation.    . furosemide (LASIX) 20 MG tablet Take 20 mg by mouth daily.    [name] gabapentin (NEURONTIN) 100 MG capsule Take 100 mg by mouth 3 (three) times daily.    [name] glipiZIDE (GLUCOTROL) 5 MG tablet Take 2.5 mg by mouth 2 (two) times daily before a meal.     . loperamide (IMODIUM) 2 MG capsule Take 2 mg by mouth 4 (four) times daily  as needed for diarrhea or loose stools.    [name] loratadine (CLARITIN) 10 MG tablet Take 10 mg by mouth daily.    . nitroGLYCERIN (NITROSTAT) 0.4 MG SL tablet Place 1 tablet (0.4 mg total) under the tongue every 5 (five) minutes as needed for chest pain. 25 tablet 3  . omeprazole (PRILOSEC) 40 MG capsule Take 40 mg by mouth daily.      . ondansetron (ZOFRAN) 4 MG tablet Take 4 mg by mouth every 8 (eight) hours as needed for nausea or vomiting.    . Probiotic Product (ALIGN PO) Take 1 each by mouth daily.    . prochlorperazine (COMPAZINE) 10 MG tablet Take 10 mg by mouth every 6 (six) hours as needed for nausea or vomiting.    . promethazine (PHENERGAN) 25 MG tablet Take 25 mg by mouth every 6 (six) hours as needed for nausea or vomiting.    . Vitamin D, Ergocalciferol, (DRISDOL) 50000 UNITS CAPS capsule Take 50,000 Units by mouth every 7 (seven) days.     No current facility-administered medications on file prior to visit.   Allergies  Allergen Reactions  .  Codeine Nausea Only  . Erythromycin Itching  . Lopid [Gemfibrozil] Itching   History  Substance Use Topics  . Smoking status: Never Smoker   . Smokeless tobacco: Never Used  . Alcohol Use: Yes     Comment: "mixed drink once a year"   Family History  Problem Relation Age of Onset  . Coronary artery disease Father   . Coronary artery disease Sister   . Coronary artery disease Brother    ROS: A comprehensive Review of Systems - was performed. Review of Systems  Constitutional: Positive for malaise/fatigue (expected with chemotherapy radiation etc. but getting better).  Respiratory: Negative for cough.   Gastrointestinal: Negative for blood in stool and melena.  Genitourinary: Negative for hematuria.  Musculoskeletal: Positive for joint pain. Negative for falls.  Neurological: Positive for dizziness (Occasional positional).  Psychiatric/Behavioral: Negative for depression.  All other systems reviewed and are negative.  Wt Readings from Last 3 Encounters:  06/18/14 67.541 kg (148 lb 14.4 oz)  06/10/14 68.085 kg (150 lb 1.6 oz)  11/01/13 69.627 kg (153 lb 8 oz)   PHYSICAL EXAM BP 116/68 mmHg  Pulse 77  Ht 5' (1.524 m)  Wt 67.541 kg (148 lb 14.4 oz)  BMI 29.08 kg/m2 General appearance: alert, cooperative, appears stated age, no distress and Well-nourished and well-groomed. Very talkative. Intelligent gentleman. Answers questions appropriately.  Neck: no adenopathy, no carotid bruit, no JVD and supple, symmetrical, trachea midline  Lungs: CTAB, normal percussion bilaterally and Nonlabored, good air movement  Heart: RRR with ectopy, S1: normal, S2: physiologically split and Otherwise no M/R/G.  Abdomen: soft, non-tender; bowel sounds normal; no masses, no organomegaly  Extremities: extremities normal, atraumatic, no cyanosis or edema, no edema,  Pulses: 2+ and symmetric  Neurologic: Grossly normal   Adult ECG Report  Rate: 73 ;  Rhythm: normal sinus rhythm -   Narrative  Interpretation: normal EKG  Recent Labs :  From [facility] viewed through care everywhere.  June 3: Sodium 137, potassium 4, chloride 107, bicarbonate 19, BUN/creatinine 44/2.74, glucose 140.   ASSESSMENT / PLAN: Problem List Items Addressed This Visit    Abnormal nuclear stress test: Anterior ischemia, INTERMEDIATE RISK  - Primary (Chronic)    This is a tough situation where she is just recovering from Escherichia coli sepsis and is not yet patient and a monitor. She has  an intermediate stress test which does warrant cardiac catheterization to delineate her anatomy. Interestingly is that this would suggest an issue with the LIMA or the upstream LAD. She is due to have relook blood cultures either at the end of this week or next week and should be completing anabiotic's soon. I would like for her to be at least 48 hours post having negative blood cultures. I'll also like to give her kidneys a little more time to recover since she had an acute exacerbation of her chronic renal insufficiency during her last hospitalization. We will tentatively posterior for catheterization 2 weeks from now. She may require preadmission for hydration.      Relevant Orders   EKG 12-Lead (Completed)   LEFT HEART CATHETERIZATION WITH CORONARY/GRAFT ANGIOGRAM   APTT   Basic metabolic panel   CBC   Protime-INR   Coronary artery disease involving native coronary artery without angina pectoris (Chronic)    So far she's not really had any anginal symptoms, but she did have positive troponin in the setting of her acute illness. The stress test was relatively convincing and therefore we should proceed with cardiac catheterization as noted above. She remains on aspirin and statin as well as beta blocker. Not on an ACE inhibitor due to renal insufficiency. She takes low-dose of furosemide for her pedal edema.      Relevant Medications   metoprolol (LOPRESSOR) 50 MG tablet   Other Relevant Orders   EKG 12-Lead (Completed)    LEFT HEART CATHETERIZATION WITH CORONARY/GRAFT ANGIOGRAM   APTT   Basic metabolic panel   CBC   Protime-INR   Essential hypertension (Chronic)    Well-controlled on current medication.      Relevant Medications   metoprolol (LOPRESSOR) 50 MG tablet   Other Relevant Orders   EKG 12-Lead (Completed)   LEFT HEART CATHETERIZATION WITH CORONARY/GRAFT ANGIOGRAM   APTT   Basic metabolic panel   CBC   Protime-INR   Hyperlipidemia LDL goal <70 (Chronic)    I have not seen recent labs. We can reassess this once we have seen her post. She is on statin.      Relevant Medications   metoprolol (LOPRESSOR) 50 MG tablet   Other Relevant Orders   EKG 12-Lead (Completed)   LEFT HEART CATHETERIZATION WITH CORONARY/GRAFT ANGIOGRAM   APTT   Basic metabolic panel   CBC   Protime-INR   S/P CABG x 3 (Chronic)   Relevant Orders   EKG 12-Lead (Completed)   LEFT HEART CATHETERIZATION WITH CORONARY/GRAFT ANGIOGRAM   APTT   Basic metabolic panel   CBC   Protime-INR    Other Visit Diagnoses    Polypharmacy        Relevant Orders    APTT    Basic metabolic panel    CBC    Protime-INR    Blood clotting disorder        Relevant Orders    APTT    Basic metabolic panel    CBC    Protime-INR       Orders Placed This Encounter  Procedures  . APTT  . Basic metabolic panel  . CBC  . Protime-INR  . EKG 12-Lead  . LEFT HEART CATHETERIZATION WITH CORONARY/GRAFT ANGIOGRAM   Meds ordered this encounter  Medications  . cefTRIAXone 1 g in dextrose 5 % 50 mL    Sig: Inject 1 g into the vein daily. 1 g in sodium chloride 0.9 % 0.9 % 100 mL IVPB  .  nystatin (MYCOSTATIN) powder    Sig: Apply 1 cartridge topically daily as needed.  . ONE TOUCH ULTRA TEST test strip    Sig:     Refill:  3  . ONETOUCH DELICA LANCETS [id number] MISC    Sig: 2 (two) times daily.    Refill:  3  . metoprolol (LOPRESSOR) 50 MG tablet    Sig: Take 0.5 tablets (25 mg total) by mouth 2 (two) times daily.    Dispense:  90  tablet    Refill:  3     Followup: ~1 months, as post cath Follow-up     Charon Smedberg, Leonie Green, M.D., M.S. Interventional Cardiologist   Pager # [phone]

## 2014-06-18 NOTE — Patient Instructions (Signed)
SCHEDULE L AND CORS WITH GRAFTS THE WEEK OF June 20 ,2016- Your physician has requested that you have a cardiac catheterization. Cardiac catheterization is used to diagnose and/or treat various heart conditions. Doctors may recommend this procedure for a number of different reasons. The most common reason is to evaluate chest pain. Chest pain can be a symptom of coronary artery disease (CAD), and cardiac catheterization can show whether plaque is narrowing or blocking your heart's arteries. This procedure is also used to evaluate the valves, as well as measure the blood flow and oxygen levels in different parts of your heart. For further information please visit [email or web address]. Please follow instruction sheet, as given.  DO LABS WHEN YOU GOT NEXT WEEK TO OTHER DOCTORS OFFICE.  Your physician recommends that you schedule a follow-up appointment in 2-3 WEEKS AFTER CATH.

## 2014-06-20 ENCOUNTER — Encounter: Payer: Self-pay | Admitting: Cardiology

## 2014-06-20 NOTE — Assessment & Plan Note (Signed)
This is a tough situation where she is just recovering from Escherichia coli sepsis and is not yet patient and a monitor. She has an intermediate stress test which does warrant cardiac catheterization to delineate her anatomy. Interestingly is that this would suggest an issue with the LIMA or the upstream LAD. She is due to have relook blood cultures either at the end of this week or next week and should be completing anabiotic's soon. I would like for her to be at least 48 hours post having negative blood cultures. I'll also like to give her kidneys a little more time to recover since she had an acute exacerbation of her chronic renal insufficiency during her last hospitalization. We will tentatively posterior for catheterization 2 weeks from now. She may require preadmission for hydration.

## 2014-06-20 NOTE — Assessment & Plan Note (Signed)
Well- controlled on current medication 

## 2014-06-20 NOTE — Assessment & Plan Note (Signed)
I have not seen recent labs. We can reassess this once we have seen her post. She is on statin.

## 2014-06-20 NOTE — Assessment & Plan Note (Signed)
So far she's not really had any anginal symptoms, but she did have positive troponin in the setting of her acute illness. The stress test was relatively convincing and therefore we should proceed with cardiac catheterization as noted above. She remains on aspirin and statin as well as beta blocker. Not on an ACE inhibitor due to renal insufficiency. She takes low-dose of furosemide for her pedal edema.

## 2014-06-24 ENCOUNTER — Telehealth: Payer: Self-pay | Admitting: Cardiology

## 2014-06-24 NOTE — Telephone Encounter (Signed)
Pt is going to have lab work at home [place] coming to draw lab work. 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 Pt wants to know does she need both lab work today?

## 2014-06-24 NOTE — Telephone Encounter (Signed)
Spoke to patient Informed patient,she can have labs drawn by [facility] through her port a cath. She does not need a chest xray. She verbalized understanding.

## 2014-06-24 NOTE — Telephone Encounter (Signed)
Spoke with pt, okay to have pre-cath labs drawn today

## 2014-06-26 ENCOUNTER — Telehealth: Payer: Self-pay | Admitting: Cardiology

## 2014-06-26 NOTE — Telephone Encounter (Signed)
Pt called in stating that [facility] took some labs on 6/13 and she wanted to know if Dr.Harding had a chance to look over them yet. She states that her creatinine levels were off and she wanted to know if they had been regulated since she will be having a catheterization coming up soon. Please call  Thanks

## 2014-06-26 NOTE — Telephone Encounter (Signed)
Informed patient lab results are not present in the office Informed patient will contact [facility] to fax [facility] and have Dr Ellyn Hack to review and contact her tomorrow

## 2014-06-27 ENCOUNTER — Other Ambulatory Visit: Payer: Self-pay | Admitting: *Deleted

## 2014-06-27 DIAGNOSIS — Z01818 Encounter for other preprocedural examination: Secondary | ICD-10-CM

## 2014-06-27 NOTE — Telephone Encounter (Signed)
Spoke to patient Informed patient Dr Ellyn Hack reviewed lab work from 06/24/14. Per order , arrive at hospital 7am for hydration for 6 hours and cath will be in the afternoon. Patient is aware and voiced understanding.

## 2014-06-28 ENCOUNTER — Encounter: Payer: Self-pay | Admitting: Cardiology

## 2014-07-01 ENCOUNTER — Ambulatory Visit (HOSPITAL_COMMUNITY)
Admission: RE | Admit: 2014-07-01 | Discharge: 2014-07-02 | Disposition: A | Discharge status: Home / Self Care | Payer: Medicare Other | Source: Ambulatory Visit | Attending: Cardiology | Admitting: Cardiology

## 2014-07-01 ENCOUNTER — Encounter (HOSPITAL_COMMUNITY)
Admission: RE | Disposition: A | Discharge status: Home / Self Care | Payer: Medicare Other | Source: Ambulatory Visit | Attending: Cardiology

## 2014-07-01 ENCOUNTER — Encounter (HOSPITAL_COMMUNITY): Payer: Self-pay | Admitting: *Deleted

## 2014-07-01 DIAGNOSIS — D649 Anemia, unspecified: Secondary | ICD-10-CM | POA: Diagnosis present

## 2014-07-01 DIAGNOSIS — I2582 Chronic total occlusion of coronary artery: Secondary | ICD-10-CM | POA: Diagnosis not present

## 2014-07-01 DIAGNOSIS — E785 Hyperlipidemia, unspecified: Secondary | ICD-10-CM | POA: Diagnosis not present

## 2014-07-01 DIAGNOSIS — Z8673 Personal history of transient ischemic attack (TIA), and cerebral infarction without residual deficits: Secondary | ICD-10-CM | POA: Diagnosis not present

## 2014-07-01 DIAGNOSIS — Z8542 Personal history of malignant neoplasm of other parts of uterus: Secondary | ICD-10-CM | POA: Insufficient documentation

## 2014-07-01 DIAGNOSIS — Z905 Acquired absence of kidney: Secondary | ICD-10-CM | POA: Diagnosis not present

## 2014-07-01 DIAGNOSIS — Z7982 Long term (current) use of aspirin: Secondary | ICD-10-CM | POA: Insufficient documentation

## 2014-07-01 DIAGNOSIS — K219 Gastro-esophageal reflux disease without esophagitis: Secondary | ICD-10-CM | POA: Insufficient documentation

## 2014-07-01 DIAGNOSIS — I2510 Atherosclerotic heart disease of native coronary artery without angina pectoris: Secondary | ICD-10-CM

## 2014-07-01 DIAGNOSIS — E119 Type 2 diabetes mellitus without complications: Secondary | ICD-10-CM

## 2014-07-01 DIAGNOSIS — Z9221 Personal history of antineoplastic chemotherapy: Secondary | ICD-10-CM | POA: Insufficient documentation

## 2014-07-01 DIAGNOSIS — I10 Essential (primary) hypertension: Secondary | ICD-10-CM | POA: Diagnosis present

## 2014-07-01 DIAGNOSIS — I25810 Atherosclerosis of coronary artery bypass graft(s) without angina pectoris: Secondary | ICD-10-CM | POA: Diagnosis not present

## 2014-07-01 DIAGNOSIS — I129 Hypertensive chronic kidney disease with stage 1 through stage 4 chronic kidney disease, or unspecified chronic kidney disease: Secondary | ICD-10-CM | POA: Insufficient documentation

## 2014-07-01 DIAGNOSIS — Z8249 Family history of ischemic heart disease and other diseases of the circulatory system: Secondary | ICD-10-CM | POA: Diagnosis not present

## 2014-07-01 DIAGNOSIS — Z85528 Personal history of other malignant neoplasm of kidney: Secondary | ICD-10-CM | POA: Insufficient documentation

## 2014-07-01 DIAGNOSIS — R9439 Abnormal result of other cardiovascular function study: Secondary | ICD-10-CM | POA: Diagnosis present

## 2014-07-01 DIAGNOSIS — Z951 Presence of aortocoronary bypass graft: Secondary | ICD-10-CM

## 2014-07-01 DIAGNOSIS — Z923 Personal history of irradiation: Secondary | ICD-10-CM | POA: Diagnosis not present

## 2014-07-01 DIAGNOSIS — N183 Chronic kidney disease, stage 3 (moderate): Secondary | ICD-10-CM | POA: Diagnosis not present

## 2014-07-01 DIAGNOSIS — E1169 Type 2 diabetes mellitus with other specified complication: Secondary | ICD-10-CM | POA: Diagnosis present

## 2014-07-01 DIAGNOSIS — N184 Chronic kidney disease, stage 4 (severe): Secondary | ICD-10-CM | POA: Diagnosis present

## 2014-07-01 DIAGNOSIS — Z01818 Encounter for other preprocedural examination: Secondary | ICD-10-CM

## 2014-07-01 HISTORY — PX: CARDIAC CATHETERIZATION: SHX172

## 2014-07-01 LAB — BASIC METABOLIC PANEL
Anion gap: 6 (ref 5–15)
BUN: 30 mg/dL — AB (ref 6–20)
CO2: 28 mmol/L (ref 22–32)
Calcium: 8.7 mg/dL — ABNORMAL LOW (ref 8.9–10.3)
Chloride: 106 mmol/L (ref 101–111)
Creatinine, Ser: 2.27 mg/dL — ABNORMAL HIGH (ref 0.44–1.00)
GFR, EST AFRICAN AMERICAN: 24 mL/min — AB (ref >60)
GFR, EST NON AFRICAN AMERICAN: 21 mL/min — AB (ref >60)
Glucose, Bld: 108 mg/dL — ABNORMAL HIGH (ref 65–99)
POTASSIUM: 5 mmol/L (ref 3.5–5.1)
Sodium: 140 mmol/L (ref 135–145)

## 2014-07-01 LAB — GLUCOSE, CAPILLARY
GLUCOSE-CAPILLARY: 107 mg/dL — AB (ref 65–99)
Glucose-Capillary: 77 mg/dL (ref 65–99)
Glucose-Capillary: 69 mg/dL (ref 65–99)
Glucose-Capillary: 98 mg/dL (ref 65–99)
Glucose-Capillary: 123 mg/dL — ABNORMAL HIGH (ref 65–99)

## 2014-07-01 LAB — CBC
HEMATOCRIT: 24 % — AB (ref 36.0–46.0)
HEMOGLOBIN: 8.1 g/dL — AB (ref 12.0–15.0)
MCH: 31.8 pg (ref 26.0–34.0)
MCHC: 33.8 g/dL (ref 30.0–36.0)
MCV: 94.1 fL (ref 78.0–100.0)
Platelets: 158 10*3/uL (ref 150–400)
RBC: 2.55 MIL/uL — ABNORMAL LOW (ref 3.87–5.11)
RDW: 20.1 % — AB (ref 11.5–15.5)
WBC: 4.3 10*3/uL (ref 4.0–10.5)

## 2014-07-01 SURGERY — LEFT HEART CATH AND CORS/GRAFTS ANGIOGRAPHY

## 2014-07-01 MED ORDER — SODIUM CHLORIDE 0.9 % IV SOLN: 250.0000 mL | INTRAVENOUS | Status: DC | PRN

## 2014-07-01 MED ORDER — HEPARIN (PORCINE) IN NACL 2-0.9 UNIT/ML-% IJ SOLN
INTRAMUSCULAR | Status: AC
  Filled 2014-07-01: qty 1500

## 2014-07-01 MED ORDER — LOPERAMIDE HCL 2 MG PO CAPS: 2.0000 mg | ORAL_CAPSULE | Freq: Four times a day (QID) | ORAL | Status: DC | PRN

## 2014-07-01 MED ORDER — FUROSEMIDE 20 MG PO TABS
20.0000 mg | ORAL_TABLET | Freq: Every day | ORAL | Status: DC
  Filled 2014-07-01: qty 1

## 2014-07-01 MED ORDER — SODIUM CHLORIDE 0.9 % IV SOLN: INTRAVENOUS | Status: DC

## 2014-07-01 MED ORDER — VITAMIN D (ERGOCALCIFEROL) 1.25 MG (50000 UNIT) PO CAPS
50000.0000 [IU] | ORAL_CAPSULE | ORAL | Status: DC
  Filled 2014-07-01: qty 1

## 2014-07-01 MED ORDER — FENTANYL CITRATE (PF) 100 MCG/2ML IJ SOLN
INTRAMUSCULAR | Status: DC | PRN
  Administered 2014-07-01: 25 ug via INTRAVENOUS

## 2014-07-01 MED ORDER — SODIUM CHLORIDE 0.9 % IV SOLN
INTRAVENOUS | Status: AC
  Administered 2014-07-01: 20:00:00 via INTRAVENOUS

## 2014-07-01 MED ORDER — LORATADINE 10 MG PO TABS
10.0000 mg | ORAL_TABLET | Freq: Every day | ORAL | Status: DC
  Administered 2014-07-02: 09:00:00 10 mg via ORAL
  Filled 2014-07-01 (×2): qty 1

## 2014-07-01 MED ORDER — ONDANSETRON HCL 4 MG/2ML IJ SOLN: 4.0000 mg | Freq: Four times a day (QID) | INTRAMUSCULAR | Status: DC | PRN

## 2014-07-01 MED ORDER — PANTOPRAZOLE SODIUM 40 MG PO TBEC
40.0000 mg | DELAYED_RELEASE_TABLET | Freq: Every day | ORAL | Status: DC
  Administered 2014-07-02: 09:00:00 40 mg via ORAL
  Filled 2014-07-01 (×2): qty 1

## 2014-07-01 MED ORDER — SODIUM CHLORIDE 0.9 % IJ SOLN: 3.0000 mL | Freq: Two times a day (BID) | INTRAMUSCULAR | Status: DC

## 2014-07-01 MED ORDER — ONDANSETRON HCL 4 MG PO TABS: 4.0000 mg | ORAL_TABLET | Freq: Three times a day (TID) | ORAL | Status: DC | PRN

## 2014-07-01 MED ORDER — LIDOCAINE HCL (PF) 1 % IJ SOLN
INTRAMUSCULAR | Status: AC
  Filled 2014-07-01: qty 30

## 2014-07-01 MED ORDER — SODIUM CHLORIDE 0.9 % IV SOLN
INTRAVENOUS | Status: DC
  Administered 2014-07-01: 06:00:00 via INTRAVENOUS

## 2014-07-01 MED ORDER — PROMETHAZINE HCL 25 MG PO TABS: 25.0000 mg | ORAL_TABLET | Freq: Four times a day (QID) | ORAL | Status: DC | PRN

## 2014-07-01 MED ORDER — ACETAMINOPHEN 325 MG PO TABS: 650.0000 mg | ORAL_TABLET | ORAL | Status: DC | PRN

## 2014-07-01 MED ORDER — ASPIRIN 81 MG PO TABS
81.0000 mg | ORAL_TABLET | Freq: Every day | ORAL | Status: DC
  Filled 2014-07-01 (×3): qty 1

## 2014-07-01 MED ORDER — DOCUSATE SODIUM 100 MG PO CAPS: 100.0000 mg | ORAL_CAPSULE | Freq: Every day | ORAL | Status: DC | PRN

## 2014-07-01 MED ORDER — NITROGLYCERIN 0.4 MG SL SUBL: 0.4000 mg | SUBLINGUAL_TABLET | SUBLINGUAL | Status: DC | PRN

## 2014-07-01 MED ORDER — GABAPENTIN 100 MG PO CAPS
100.0000 mg | ORAL_CAPSULE | Freq: Three times a day (TID) | ORAL | Status: DC
  Administered 2014-07-01 – 2014-07-02 (×3): 100 mg via ORAL
  Filled 2014-07-01 (×3): qty 1

## 2014-07-01 MED ORDER — FENTANYL CITRATE (PF) 100 MCG/2ML IJ SOLN
INTRAMUSCULAR | Status: AC
  Filled 2014-07-01: qty 2

## 2014-07-01 MED ORDER — MIDAZOLAM HCL 2 MG/2ML IJ SOLN
INTRAMUSCULAR | Status: DC | PRN
  Administered 2014-07-01: 2 mg via INTRAVENOUS

## 2014-07-01 MED ORDER — IOHEXOL 350 MG/ML SOLN
INTRAVENOUS | Status: DC | PRN
  Administered 2014-07-01: 105 mL via INTRA_ARTERIAL

## 2014-07-01 MED ORDER — SODIUM CHLORIDE 0.9 % WEIGHT BASED INFUSION: 3.0000 mL/kg/h | INTRAVENOUS | Status: AC

## 2014-07-01 MED ORDER — MIDAZOLAM HCL 2 MG/2ML IJ SOLN
INTRAMUSCULAR | Status: AC
  Filled 2014-07-01: qty 2

## 2014-07-01 MED ORDER — PROCHLORPERAZINE MALEATE 10 MG PO TABS
10.0000 mg | ORAL_TABLET | Freq: Four times a day (QID) | ORAL | Status: DC | PRN
  Filled 2014-07-01: qty 1

## 2014-07-01 MED ORDER — ASPIRIN 81 MG PO CHEW
81.0000 mg | CHEWABLE_TABLET | Freq: Every day | ORAL | Status: DC
  Administered 2014-07-01: 22:00:00 81 mg via ORAL

## 2014-07-01 MED ORDER — SODIUM CHLORIDE 0.9 % IJ SOLN: 3.0000 mL | INTRAMUSCULAR | Status: DC | PRN

## 2014-07-01 MED ORDER — ATORVASTATIN CALCIUM 40 MG PO TABS
40.0000 mg | ORAL_TABLET | Freq: Every day | ORAL | Status: DC
  Administered 2014-07-01: 21:00:00 40 mg via ORAL
  Filled 2014-07-01: qty 1

## 2014-07-01 MED ORDER — GLIPIZIDE 2.5 MG HALF TABLET
2.5000 mg | ORAL_TABLET | Freq: Two times a day (BID) | ORAL | Status: DC
  Administered 2014-07-02: 2.5 mg via ORAL
  Filled 2014-07-01 (×4): qty 1

## 2014-07-01 MED ORDER — METOPROLOL TARTRATE 25 MG PO TABS
25.0000 mg | ORAL_TABLET | Freq: Two times a day (BID) | ORAL | Status: DC
  Administered 2014-07-01 – 2014-07-02 (×2): 25 mg via ORAL
  Filled 2014-07-01 (×3): qty 1

## 2014-07-01 SURGICAL SUPPLY — 21 items
CATH EXPO 5F MPA-1 (CATHETERS) ×1 IMPLANT
CATH INFINITI 5 FR AR1 MOD (CATHETERS) ×1 IMPLANT
CATH INFINITI 5 FR RCB (CATHETERS) ×1 IMPLANT
CATH INFINITI 5FR AL1 (CATHETERS) ×1 IMPLANT
CATH INFINITI 5FR ANG PIGTAIL (CATHETERS) ×1 IMPLANT
CATH INFINITI 5FR MULTPACK ANG (CATHETERS) IMPLANT
CATH LAUNCHER 5F RADR (CATHETERS) IMPLANT
CATH OPTITORQUE TIG 4.0 5F (CATHETERS) ×1 IMPLANT
CATHETER LAUNCHER 5F RADR (CATHETERS) ×2
DEVICE RAD COMP TR BAND LRG (VASCULAR PRODUCTS) ×1 IMPLANT
GLIDESHEATH SLEND A-KIT 6F 22G (SHEATH) ×1 IMPLANT
KIT HEART LEFT (KITS) ×2 IMPLANT
PACK CARDIAC CATHETERIZATION (CUSTOM PROCEDURE TRAY) ×2 IMPLANT
PINNACLE LONG 5F 25CM (SHEATH) ×2
SHEATH INTROD PINNACLE 5F 25CM (SHEATH) IMPLANT
SHEATH PINNACLE 5F 10CM (SHEATH) ×1 IMPLANT
SYR MEDRAD MARK V 150ML (SYRINGE) ×2 IMPLANT
TRANSDUCER W/STOPCOCK (MISCELLANEOUS) ×2 IMPLANT
TUBING CIL FLEX 10 FLL-RA (TUBING) ×2 IMPLANT
WIRE EMERALD 3MM-J .035X150CM (WIRE) ×1 IMPLANT
WIRE SAFE-T 1.5MM-J .035X260CM (WIRE) ×1 IMPLANT

## 2014-07-01 NOTE — H&P (View-Only) (Signed)
PCP: Stephens Shire, MD Nephrologist: Dr. Joelyn Oms  Clinic Note: Chief Complaint  Patient presents with  . Hospitalization Follow-up    swelling bilateral feet swelling,stress test results,  . Coronary Artery Disease    Abnormal nuclear stress test   HPI: Dana Jackson is a 69 y.o. female with a PMH of prior TIA and carotid directly as well as partial nephrectomy from renal cell carcinoma with residual CK D-3B who presents today for followup of her CAD status post CABG. Was @ [facility] for Endometrial Ca Rx Followed by urosepsis-- had Nuc ST. Has subsequently developed chronic renal insufficiency with a creatinine (related to ATN) of roughly 2.7-3.0. Back in March the creatinine was 1.7-1.8 range  Since I last saw her, she has been treated at [facility] for endometrial cancer status post TAH/BSO followed by chemotherapy and radiation. Her chemotherapy. The fourth round. She was then readmitted to the hospital recently in early June with riders and chills. She was eventually diagnosed with bacteremia with pansensitive Escherichia coli. He was not sure what the source was. She is just now completing a perianal buttocks at the end of this week to beginning of next week. Part of her evaluation in the hospital, she had a PET Myoview stress test done to evaluate her for potential ischemic reason for her symptoms. Apparently she had mildly elevated troponin levels. This study stress test showed a reversible defect in the anterior wall consistent with ischemia. Thought was that she would likely need cardiac catheterization to delineate her anatomy. This was not done in the hospital because of acute on chronic kidney injury. They also want her to see 48 hours negative blood cultures. She now presents here today with the results of her stress test. She has not been febrile or had any right wrist since. She is finally now starting to get back some energy.  Interval History: From a cardiac standpoint, she  really denies having any symptoms. She doesn't really remember much at all about the recent hospitalization with bacteremia/septic shock. She doesn't recall having any evidence of any chest tightness or pressure. Since then, she has not had any anginal symptoms with rest or exertion. No heart failure symptoms of PND, orthopnea,but does have some intermittent lower extremity edema that is chronic. Usually in her feet. Remainder of her Cardiovascular ROS is as follows:  No palpitations, weakness or syncope/near syncope.  No TIA/amaurosis fugax symptoms.  No melena, hematochezia, hematuria, or epstaxis.  No claudication.  She still has some occasional lightheadedness and dizziness that is positional most. If she overdoes her exercise she will get short of breath.  Past Medical History  Diagnosis Date  . 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  95% ostial circumflex, 80-90% PDA  . 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 History of unstable angina November/December 2012    T wave inversions in inferolateral leads.  No stress test performed.  [name] TIA (transient ischemic attack) 1992 &  2010  . Hypertension, essential, benign   . Hyperlipidemia LDL goal <70   . CKD (chronic kidney disease) stage 3, GFR 30-59 ml/min   . GERD (gastroesophageal reflux disease)   . Arthritis   . History of asthma      childhood  . History of pneumonia     "2-3 times"  . Degenerative disc disease, cervical  Degenerative disc disease, cervical [722.4]  . Stroke     TIA  . Asthma     childhood  . Pneumonia     walking  . Renal cell carcinoma 11/18/2008    T2aNx s/p partial left nephrectomy  . Endometrial cancer November 2015    Treated with TAH with pelvic lymphadenectomy followed by radiation and chemotherapy  . Bacteremia due to Escherichia coli June 2015     Currently being treated with  anti-biotics   Past Surgical History  Procedure Laterality Date  . Partial nephrectomy Left 11/18/2008    left partial nephrectomy for renal cell CA  . Anterior cervical decomp/discectomy fusion  10/11/2005    multi-level  . Cardiac catheterization  12/24/10    40% left main, 80% mid LAD, 95% ostial circumflex, 80-90% PDA.  [name] Tonsillectomy      "in college"  . Cholecystectomy  ~ 1971  . Tubal ligation  ~ 1984  . Coronary artery bypass graft  12/25/2010    Procedure: CORONARY ARTERY BYPASS GRAFTING (CABGX3 - LIMA-LAD, SVG RPL, SVG-Circumflex);  Surgeon: Rexene Alberts, MD;  Location: [place];  Service: Open Heart Surgery;  Laterality: N/A;  . Carotid endarterectomy Left   . Colonoscopy    . Upper gi endoscopy    . Wisdom tooth extraction    . Dilatation & curettage/hysteroscopy with myosure N/A 11/02/2013    Procedure: DILATATION & CURETTAGE/HYSTEROSCOPY WITH MYOSURE ABLATION;  Surgeon: Allena Katz, MD;  Location: [facility];  Service: Gynecology;  Laterality: N/A;  . Left heart catheterization with coronary angiogram N/A 12/24/2010    Procedure: LEFT HEART CATHETERIZATION WITH CORONARY ANGIOGRAM;  Surgeon: Leonie Man, MD;  Location: [facility] CATH LAB;  Service: Cardiovascular;  Laterality: N/A;  . Total abdominal hysterectomy  November 2015     At [facility]: Robotic procedure with pelvic lymphadenectomy  . Transthoracic echocardiogram  06/13/2014    At [place]: EF 60-65%. Grade 1 diastolic dysfunction. Mild MR. Aortic sclerosis. Moderate pulmonary hypertension  . Pet myocardial perfusion scan  06/13/2014    At [place]: Moderate size, mild severity completely reversible defect involving the basal anterior, mid anterior and apical anterior segments consistent with ischemia. EF 65% with normal global function.    Recent Cardiac  Evaluation: From [facility] 2nd  Echocardiogram: EF 60-65%. Grade 1 diastolic dysfunction. Mild MR. Aortic sclerosis. Moderate pulmonary  hypertension.  PET Myocardial Perfusion Scan:  Moderate size, mild severity completely reversible defect involving the basal anterior, mid anterior and apical anterior segments consistent with ischemia. EF 65% with normal global function.   Current Outpatient Prescriptions on File Prior to Visit  Medication Sig Dispense Refill  . Alpha-Lipoic Acid 300 MG TABS Take 600 mg by mouth daily.    [name] aspirin 81 MG tablet Take 81 mg by mouth daily.      [name] atorvastatin (LIPITOR) 40 MG tablet Take 40 mg by mouth at bedtime.     . docusate sodium (COLACE) 100 MG capsule Take 100 mg by mouth daily as needed for mild constipation.    . furosemide (LASIX) 20 MG tablet Take 20 mg by mouth daily.    [name] gabapentin (NEURONTIN) 100 MG capsule Take 100 mg by mouth 3 (three) times daily.    [name] glipiZIDE (GLUCOTROL) 5 MG tablet Take 2.5 mg by mouth 2 (two) times daily before a meal.     . loperamide (IMODIUM) 2 MG capsule Take 2 mg by mouth 4 (four) times daily  as needed for diarrhea or loose stools.    [name] loratadine (CLARITIN) 10 MG tablet Take 10 mg by mouth daily.    . nitroGLYCERIN (NITROSTAT) 0.4 MG SL tablet Place 1 tablet (0.4 mg total) under the tongue every 5 (five) minutes as needed for chest pain. 25 tablet 3  . omeprazole (PRILOSEC) 40 MG capsule Take 40 mg by mouth daily.      . ondansetron (ZOFRAN) 4 MG tablet Take 4 mg by mouth every 8 (eight) hours as needed for nausea or vomiting.    . Probiotic Product (ALIGN PO) Take 1 each by mouth daily.    . prochlorperazine (COMPAZINE) 10 MG tablet Take 10 mg by mouth every 6 (six) hours as needed for nausea or vomiting.    . promethazine (PHENERGAN) 25 MG tablet Take 25 mg by mouth every 6 (six) hours as needed for nausea or vomiting.    . Vitamin D, Ergocalciferol, (DRISDOL) 50000 UNITS CAPS capsule Take 50,000 Units by mouth every 7 (seven) days.     No current facility-administered medications on file prior to visit.   Allergies  Allergen Reactions  .  Codeine Nausea Only  . Erythromycin Itching  . Lopid [Gemfibrozil] Itching   History  Substance Use Topics  . Smoking status: Never Smoker   . Smokeless tobacco: Never Used  . Alcohol Use: Yes     Comment: "mixed drink once a year"   Family History  Problem Relation Age of Onset  . Coronary artery disease Father   . Coronary artery disease Sister   . Coronary artery disease Brother    ROS: A comprehensive Review of Systems - was performed. Review of Systems  Constitutional: Positive for malaise/fatigue (expected with chemotherapy radiation etc. but getting better).  Respiratory: Negative for cough.   Gastrointestinal: Negative for blood in stool and melena.  Genitourinary: Negative for hematuria.  Musculoskeletal: Positive for joint pain. Negative for falls.  Neurological: Positive for dizziness (Occasional positional).  Psychiatric/Behavioral: Negative for depression.  All other systems reviewed and are negative.  Wt Readings from Last 3 Encounters:  06/18/14 67.541 kg (148 lb 14.4 oz)  06/10/14 68.085 kg (150 lb 1.6 oz)  11/01/13 69.627 kg (153 lb 8 oz)   PHYSICAL EXAM BP 116/68 mmHg  Pulse 77  Ht 5' (1.524 m)  Wt 67.541 kg (148 lb 14.4 oz)  BMI 29.08 kg/m2 General appearance: alert, cooperative, appears stated age, no distress and Well-nourished and well-groomed. Very talkative. Intelligent gentleman. Answers questions appropriately.  Neck: no adenopathy, no carotid bruit, no JVD and supple, symmetrical, trachea midline  Lungs: CTAB, normal percussion bilaterally and Nonlabored, good air movement  Heart: RRR with ectopy, S1: normal, S2: physiologically split and Otherwise no M/R/G.  Abdomen: soft, non-tender; bowel sounds normal; no masses, no organomegaly  Extremities: extremities normal, atraumatic, no cyanosis or edema, no edema,  Pulses: 2+ and symmetric  Neurologic: Grossly normal   Adult ECG Report  Rate: 73 ;  Rhythm: normal sinus rhythm -   Narrative  Interpretation: normal EKG  Recent Labs :  From [facility] viewed through care everywhere.  June 3: Sodium 137, potassium 4, chloride 107, bicarbonate 19, BUN/creatinine 44/2.74, glucose 140.   ASSESSMENT / PLAN: Problem List Items Addressed This Visit    Abnormal nuclear stress test: Anterior ischemia, INTERMEDIATE RISK  - Primary (Chronic)    This is a tough situation where she is just recovering from Escherichia coli sepsis and is not yet patient and a monitor. She has  an intermediate stress test which does warrant cardiac catheterization to delineate her anatomy. Interestingly is that this would suggest an issue with the LIMA or the upstream LAD. She is due to have relook blood cultures either at the end of this week or next week and should be completing anabiotic's soon. I would like for her to be at least 48 hours post having negative blood cultures. I'll also like to give her kidneys a little more time to recover since she had an acute exacerbation of her chronic renal insufficiency during her last hospitalization. We will tentatively posterior for catheterization 2 weeks from now. She may require preadmission for hydration.      Relevant Orders   EKG 12-Lead (Completed)   LEFT HEART CATHETERIZATION WITH CORONARY/GRAFT ANGIOGRAM   APTT   Basic metabolic panel   CBC   Protime-INR   Coronary artery disease involving native coronary artery without angina pectoris (Chronic)    So far she's not really had any anginal symptoms, but she did have positive troponin in the setting of her acute illness. The stress test was relatively convincing and therefore we should proceed with cardiac catheterization as noted above. She remains on aspirin and statin as well as beta blocker. Not on an ACE inhibitor due to renal insufficiency. She takes low-dose of furosemide for her pedal edema.      Relevant Medications   metoprolol (LOPRESSOR) 50 MG tablet   Other Relevant Orders   EKG 12-Lead (Completed)    LEFT HEART CATHETERIZATION WITH CORONARY/GRAFT ANGIOGRAM   APTT   Basic metabolic panel   CBC   Protime-INR   Essential hypertension (Chronic)    Well-controlled on current medication.      Relevant Medications   metoprolol (LOPRESSOR) 50 MG tablet   Other Relevant Orders   EKG 12-Lead (Completed)   LEFT HEART CATHETERIZATION WITH CORONARY/GRAFT ANGIOGRAM   APTT   Basic metabolic panel   CBC   Protime-INR   Hyperlipidemia LDL goal <70 (Chronic)    I have not seen recent labs. We can reassess this once we have seen her post. She is on statin.      Relevant Medications   metoprolol (LOPRESSOR) 50 MG tablet   Other Relevant Orders   EKG 12-Lead (Completed)   LEFT HEART CATHETERIZATION WITH CORONARY/GRAFT ANGIOGRAM   APTT   Basic metabolic panel   CBC   Protime-INR   S/P CABG x 3 (Chronic)   Relevant Orders   EKG 12-Lead (Completed)   LEFT HEART CATHETERIZATION WITH CORONARY/GRAFT ANGIOGRAM   APTT   Basic metabolic panel   CBC   Protime-INR    Other Visit Diagnoses    Polypharmacy        Relevant Orders    APTT    Basic metabolic panel    CBC    Protime-INR    Blood clotting disorder        Relevant Orders    APTT    Basic metabolic panel    CBC    Protime-INR       Orders Placed This Encounter  Procedures  . APTT  . Basic metabolic panel  . CBC  . Protime-INR  . EKG 12-Lead  . LEFT HEART CATHETERIZATION WITH CORONARY/GRAFT ANGIOGRAM   Meds ordered this encounter  Medications  . cefTRIAXone 1 g in dextrose 5 % 50 mL    Sig: Inject 1 g into the vein daily. 1 g in sodium chloride 0.9 % 0.9 % 100 mL IVPB  .  nystatin (MYCOSTATIN) powder    Sig: Apply 1 cartridge topically daily as needed.  . ONE TOUCH ULTRA TEST test strip    Sig:     Refill:  3  . ONETOUCH DELICA LANCETS [id number] MISC    Sig: 2 (two) times daily.    Refill:  3  . metoprolol (LOPRESSOR) 50 MG tablet    Sig: Take 0.5 tablets (25 mg total) by mouth 2 (two) times daily.    Dispense:  90  tablet    Refill:  3     Followup: ~1 months, as post cath Follow-up     Makhayla Mcmurry, Leonie Green, M.D., M.S. Interventional Cardiologist   Pager # [phone]

## 2014-07-01 NOTE — Progress Notes (Signed)
Site area: lt groin fa sheath pulled  by Moishe Spice Site Prior to Removal:  Level  0 Pressure Applied For:  20 minutes Manual:   yes Patient Status During Pull:  stable Post Pull Site:  Level  0 Post Pull Instructions Given:  yes Post Pull Pulses Present: yes Dressing Applied:  yes Bedrest begins @  [id number] Comments:  none

## 2014-07-01 NOTE — Interval H&P Note (Signed)
History and Physical Interval Note:  07/01/2014 2:05 PM  Dana Jackson  has presented today for surgery, with the diagnosis of abnormal stress test, CAD s/p various methods of treatment have been discussed with the patient and family. After consideration of risks, benefits and other options for treatment, the patient has consented to  Procedure(s): Left Heart Cath and Cors/Grafts Angiography (N/A) as a surgical intervention .  The patient's history has been reviewed, patient examined, no change in status, stable for surgery.  I have reviewed the patient's chart and labs.  Questions were answered to the patient's satisfaction.     Kayliana Codd W

## 2014-07-01 NOTE — Progress Notes (Signed)
Reported Hgb and creatinine levels to Rennis Harding, RN to relay to Dr. Ellyn Hack.

## 2014-07-02 ENCOUNTER — Encounter (HOSPITAL_COMMUNITY): Payer: Self-pay | Admitting: Cardiology

## 2014-07-02 ENCOUNTER — Other Ambulatory Visit: Payer: Self-pay | Admitting: Physician Assistant

## 2014-07-02 ENCOUNTER — Telehealth: Payer: Self-pay | Admitting: Cardiology

## 2014-07-02 DIAGNOSIS — N183 Chronic kidney disease, stage 3 unspecified: Secondary | ICD-10-CM

## 2014-07-02 DIAGNOSIS — R931 Abnormal findings on diagnostic imaging of heart and coronary circulation: Secondary | ICD-10-CM

## 2014-07-02 DIAGNOSIS — D6489 Other specified anemias: Secondary | ICD-10-CM

## 2014-07-02 DIAGNOSIS — I2510 Atherosclerotic heart disease of native coronary artery without angina pectoris: Secondary | ICD-10-CM | POA: Diagnosis not present

## 2014-07-02 DIAGNOSIS — D631 Anemia in chronic kidney disease: Secondary | ICD-10-CM

## 2014-07-02 DIAGNOSIS — I25810 Atherosclerosis of coronary artery bypass graft(s) without angina pectoris: Secondary | ICD-10-CM | POA: Diagnosis not present

## 2014-07-02 LAB — BASIC METABOLIC PANEL
Anion gap: 6 (ref 5–15)
BUN: 26 mg/dL — AB (ref 6–20)
CALCIUM: 8.6 mg/dL — AB (ref 8.9–10.3)
CO2: 25 mmol/L (ref 22–32)
CREATININE: 2.12 mg/dL — AB (ref 0.44–1.00)
Chloride: 108 mmol/L (ref 101–111)
GFR calc Af Amer: 26 mL/min — ABNORMAL LOW (ref >60)
GFR, EST NON AFRICAN AMERICAN: 23 mL/min — AB (ref >60)
GLUCOSE: 90 mg/dL (ref 65–99)
Potassium: 5.1 mmol/L (ref 3.5–5.1)
Sodium: 139 mmol/L (ref 135–145)

## 2014-07-02 LAB — CBC
HEMATOCRIT: 23.8 % — AB (ref 36.0–46.0)
Hemoglobin: 7.8 g/dL — ABNORMAL LOW (ref 12.0–15.0)
MCH: 31.3 pg (ref 26.0–34.0)
MCHC: 32.8 g/dL (ref 30.0–36.0)
MCV: 95.6 fL (ref 78.0–100.0)
Platelets: 152 10*3/uL (ref 150–400)
RBC: 2.49 MIL/uL — ABNORMAL LOW (ref 3.87–5.11)
RDW: 20.3 % — ABNORMAL HIGH (ref 11.5–15.5)
WBC: 4.1 10*3/uL (ref 4.0–10.5)

## 2014-07-02 LAB — GLUCOSE, CAPILLARY: Glucose-Capillary: 97 mg/dL (ref 65–99)

## 2014-07-02 MED ORDER — METOPROLOL TARTRATE 50 MG PO TABS: 50.0000 mg | ORAL_TABLET | Freq: Two times a day (BID) | ORAL | Status: DC

## 2014-07-02 MED ORDER — ISOSORBIDE MONONITRATE ER 30 MG PO TB24
30.0000 mg | ORAL_TABLET | Freq: Every day | ORAL | Status: DC
Start: 2014-07-02 — End: 2014-10-31

## 2014-07-02 MED FILL — Lidocaine HCl Local Preservative Free (PF) Inj 1%: INTRAMUSCULAR | Qty: 30 | Status: AC

## 2014-07-02 MED FILL — Heparin Sodium (Porcine) 2 Unit/ML in Sodium Chloride 0.9%: INTRAMUSCULAR | Qty: 1500 | Status: AC

## 2014-07-02 NOTE — Discharge Summary (Signed)
CARDIOLOGY DISCHARGE SUMMARY   Patient ID: Dana Jackson MRN: [id number] DOB/AGE: [birth year] 69 y.o.  Admit date: 07/01/2014 Discharge date: 07/04/2014  PCP: Dana Shire, MD Primary Cardiologist: Dr Ellyn Hack  Primary Discharge Diagnosis:  Abnormal nuclear stress test: Anterior ischemia, INTERMEDIATE RISK  Secondary Discharge Diagnosis:    Chronic kidney disease (CKD), stage III (moderate)   Essential hypertension   S/P CABG x 3   Anemia   Coronary artery disease involving native coronary artery without angina pectoris   Hyperlipidemia LDL goal <70  Procedures:   Hospital Course: Dana Jackson is a 69 y.o. female with a history of CABG, TIA, CEA, partial nephrectomy 2nd CA, endometrial CA, recent sepsis, who had an intermediate-risk stress test and was scheduled for cath.    Labs:   Lab Results  Component Value Date   WBC 4.1 07/02/2014   HGB 7.8* 07/02/2014   HCT 23.8* 07/02/2014   MCV 95.6 07/02/2014   PLT 152 07/02/2014     Recent Labs Lab 07/02/14 0400  NA 139  K 5.1  CL 108  CO2 25  BUN 26*  CREATININE 2.12*  CALCIUM 8.6*  GLUCOSE 90      Cardiac cath: 07/01/2014 Conclusion    1. Severe multivessel native CAD with 2 of 3 grafts patent and occluded SVG-rPDA. Patent LIMA-~dLAD, SVG-OM. Occluded SVG-rPDA at the origin 2. Ost LAD lesion, 80% stenosed. Mid LAD lesion, 100% stenosed after 2nd septal perforator. The proximal-mid LAD is at least moderately diseased 3. Ramus lesion, 70% stenosed - small caliber, non-PCI amenable 4. Ost Cx to Prox Cx lesion, 99% stenosed. 1st Mrg lesion, 90% stenosed. 5. Ost RPDA to RPDA lesion, 80% stenosed. Similar to pre-CABG. < 2 mm vessel. 6. Potential culprit for abnormal Nuclear Stress Test is the diffusely diseased proximal-mid LAD with diseased septal perforators from LAD & rPDA  No PCI target available to treat the "anterior ischemia" in an asymptomatic patient.  Recommendations:  Post Femoral cath  with sheath removal   Aggressive IV fluid hydration overnight  Overnight - outpatient in bed for post cath hydration given CKD.  Continue current med Rx.  Will need to check CMP at the end of the week.   Coronary Findings    Dominance: Right   Left Main  The vessel is large .     Left Anterior Descending  There is severe the vessel.   Colon Flattery LAD lesion, 80% stenosed.   . Mid LAD lesion, 100% stenosed. calcified chronic total occlusion .   [name] First Septal Branch   The vessel is small in size.   [name] Second Septal Branch   The vessel is small in size.     Ramus Intermedius  The vessel is small .   [name] Ramus lesion, 70% stenosed. tubular eccentric . Tapered to small branch.   . Lateral Ramus Intermedius   The vessel is small in size.     Left Circumflex  The vessel is large .   [name] Ost Cx to Prox Cx lesion, 99% stenosed. diffuse located at the major branch .   . Mid Cx lesion, 60% stenosed. discrete . At this point, the vessel is small caliber   . First Obtuse Marginal Branch   . 1st Mrg lesion, 90% stenosed. tubular . Competitive flow from SVG   . Lateral First Obtuse Marginal Branch   The vessel is small in size.   . Third Obtuse Marginal Branch   The vessel is small in size.  Right Coronary Artery  The vessel is large .   [name] Mid RCA to Dist RCA lesion, 50% stenosed. tubular .   [name] Right Posterior Descending Artery   The vessel is small in size.   Colon Flattery RPDA to RPDA lesion, 80% stenosed. diffuse . Previously grafted, small caliber vessel. < 2 mm   . Right Posterior Atrioventricular Branch   The vessel is moderate in size.   . First Right Posterolateral   The vessel is moderate in size.   [name] Second Right Posterolateral   The vessel is small in size.   . Third Right Posterolateral   The vessel is moderate in size.     Graft Angiography    Free LIMA Graft to  Dist LAD  LIMA is and is anatomically normal. Tortuous graft to mid-distal LAD.     Free Graft to 1st Mrg  SVG is normal in caliber. There is competitive flow.     Free Graft to RPDA  SVG   . Origin lesion, 100% stenosed. chronic total occlusion .              EKG: SR, no acute ischemic changes  FOLLOW UP PLANS AND APPOINTMENTS Allergies  Allergen Reactions  . Codeine Nausea Only  . Erythromycin Itching  . Lopid [Gemfibrozil] Itching     Medication List    TAKE these medications        ALIGN PO  Take 1 each by mouth daily.     Alpha-Lipoic Acid 300 MG Tabs  Take 600 mg by mouth daily.     aspirin 81 MG tablet  Take 81 mg by mouth daily.     atorvastatin 40 MG tablet  Commonly known as:  LIPITOR  Take 40 mg by mouth at bedtime.     docusate sodium 100 MG capsule  Commonly known as:  COLACE  Take 100 mg by mouth daily as needed for mild constipation.     furosemide 20 MG tablet  Commonly known as:  LASIX  Take 20 mg by mouth daily.  Notes to Patient:  Hold for now, restart on 07/04/2014.     gabapentin 100 MG capsule  Commonly known as:  NEURONTIN  Take 100 mg by mouth 3 (three) times daily.     glipiZIDE 5 MG tablet  Commonly known as:  GLUCOTROL  Take 2.5 mg by mouth 2 (two) times daily before a meal.     isosorbide mononitrate 30 MG 24 hr tablet  Commonly known as:  IMDUR  Take 1 tablet (30 mg total) by mouth daily.     loperamide 2 MG capsule  Commonly known as:  IMODIUM  Take 2 mg by mouth 4 (four) times daily as needed for diarrhea or loose stools.     loratadine 10 MG tablet  Commonly known as:  CLARITIN  Take 10 mg by mouth daily.     metoprolol 50 MG tablet  Commonly known as:  LOPRESSOR  Take 1 tablet (50 mg total) by mouth 2 (two) times daily.     nitroGLYCERIN 0.4 MG SL tablet  Commonly known as:  NITROSTAT  Place 1 tablet (0.4 mg total) under the tongue every 5 (five) minutes as needed for chest pain.      nystatin powder  Commonly known as:  MYCOSTATIN  Apply 1 g topically daily as needed.     omeprazole 40 MG capsule  Commonly known as:  PRILOSEC  Take 40 mg by mouth daily.  ondansetron 4 MG tablet  Commonly known as:  ZOFRAN  Take 4 mg by mouth every 8 (eight) hours as needed for nausea or vomiting.     prochlorperazine 10 MG tablet  Commonly known as:  COMPAZINE  Take 10 mg by mouth every 6 (six) hours as needed for nausea or vomiting.     promethazine 25 MG tablet  Commonly known as:  PHENERGAN  Take 25 mg by mouth every 6 (six) hours as needed for nausea or vomiting.     Vitamin D (Ergocalciferol) 50000 UNITS Caps capsule  Commonly known as:  DRISDOL  Take 50,000 Units by mouth every 7 (seven) days.        Discharge Instructions    Diet - low sodium heart healthy    Complete by:  As directed      Diet Carb Modified    Complete by:  As directed      Increase activity slowly    Complete by:  As directed           Follow-up Information    Follow up with Erlene Quan, PA-C On 07/10/2014.   Specialties:  Cardiology, Radiology   Why:  See provider at 9:00 AM. Please arrive 30 minutes early for lab work and paperwork.   Contact information:   [place] [place] [address] [place] [address] [phone]       BRING ALL MEDICATIONS WITH YOU TO FOLLOW UP APPOINTMENTS  Time spent with patient to include physician time: 47 min Signed: Rosaria Ferries, PA-C 07/04/2014, 11:35 AM Co-Sign MD

## 2014-07-02 NOTE — Telephone Encounter (Signed)
New message      TCM appt on 07-10-14 with luke per Suanne Marker

## 2014-07-02 NOTE — Discharge Instructions (Signed)
PLEASE REMEMBER TO BRING ALL OF YOUR MEDICATIONS TO EACH OF YOUR FOLLOW-UP OFFICE VISITS. ° °PLEASE ATTEND ALL SCHEDULED FOLLOW-UP APPOINTMENTS.  ° °Activity: Increase activity slowly as tolerated. You may shower, but no soaking baths (or swimming) for 1 week. No driving for 2 days. No lifting over 5 lbs for 1 week. No sexual activity for 1 week.  ° °You May Return to Work: in 1 week (if applicable) ° °Wound Care: You may wash cath site gently with soap and water. Keep cath site clean and dry. If you notice pain, swelling, bleeding or pus at your cath site, please call [phone]. ° ° ° °Cardiac Cath Site Care °Refer to this sheet in the next few weeks. These instructions provide you with information on caring for yourself after your procedure. Your caregiver may also give you more specific instructions. Your treatment has been planned according to current medical practices, but problems sometimes occur. Call your caregiver if you have any problems or questions after your procedure. °HOME CARE INSTRUCTIONS °· You may shower 24 hours after the procedure. Remove the bandage (dressing) and gently wash the site with plain soap and water. Gently pat the site dry.  °· Do not apply powder or lotion to the site.  °· Do not sit in a bathtub, swimming pool, or whirlpool for 5 to 7 days.  °· No bending, squatting, or lifting anything over 10 pounds (4.5 kg) as directed by your caregiver.  °· Inspect the site at least twice daily.  °· Do not drive home if you are discharged the same day of the procedure. Have someone else drive you.  °· You may drive 24 hours after the procedure unless otherwise instructed by your caregiver.  °What to expect: °· Any bruising will usually fade within 1 to 2 weeks.  °· Blood that collects in the tissue (hematoma) may be painful to the touch. It should usually decrease in size and tenderness within 1 to 2 weeks.  °SEEK IMMEDIATE MEDICAL CARE IF: °· You have unusual pain at the site or down the  affected limb.  °· You have redness, warmth, swelling, or pain at the site.  °· You have drainage (other than a small amount of blood on the dressing).  °· You have chills.  °· You have a fever or persistent symptoms for more than 72 hours.  °· You have a fever and your symptoms suddenly get worse.  °· Your leg becomes pale, cool, tingly, or numb.  °· You have heavy bleeding from the site. Hold pressure on the site.  °Document Released: 01/30/2010 Document Revised: 12/17/2010 Document Reviewed: 01/30/2010 °ExitCare® Patient Information ©2012 [employer]. ° °

## 2014-07-02 NOTE — Progress Notes (Addendum)
Patient Name: Dana Jackson Date of Encounter: 07/02/2014  Principal Problem:   Abnormal nuclear stress test: Anterior ischemia, INTERMEDIATE RISK  Active Problems:   Chronic kidney disease (CKD), stage III (moderate)   Essential hypertension   S/P CABG x 3   Anemia   Coronary artery disease involving native coronary artery without angina pectoris   Hyperlipidemia LDL goal <70   Primary Cardiologist: Dr. Ellyn Hack  Patient Profile: 69 y.o. female with a history of CABG, TIA, CEA, partial nephrectomy 2nd CA, endometrial CA, recent sepsis, who had an intermediate-risk stress test and was scheduled for cath.   SUBJECTIVE: No chest pain, sob or weakness  OBJECTIVE Filed Vitals:   07/01/14 2000 07/02/14 0043 07/02/14 0411 07/02/14 0757  BP: 131/49 138/67 153/59 131/57  Pulse: 72 76 84 85  Temp:  97.6 F (36.4 C) 98.1 F (36.7 C) 98.1 F (36.7 C)  TempSrc:  Oral Oral Oral  Resp: 17 15 18 19   Height:      Weight:  153 lb 14.1 oz (69.8 kg)    SpO2: 97% 96% 95% 95%    Intake/Output Summary (Last 24 hours) at 07/02/14 0806 Last data filed at 07/02/14 0758  Gross per 24 hour  Intake    935 ml  Output   1550 ml  Net   -615 ml   Filed Weights   07/01/14 0539 07/02/14 0043  Weight: 150 lb (68.04 kg) 153 lb 14.1 oz (69.8 kg)    PHYSICAL EXAM General: Well developed, well nourished, female in no acute distress. Head: Normocephalic, atraumatic.  Neck: Supple without bruits, JVD not elevated. Lungs:  Resp regular and unlabored, CTA. Heart: RRR, S1, S2, no S3, S4, 2/6 murmur; no rub. Abdomen: Soft, non-tender, non-distended, BS + x 4.  Extremities: No clubbing, cyanosis, edema. L groin cath site without ecchymosis or hematoma Neuro: Alert and oriented X 3. Moves all extremities spontaneously. Psych: Normal affect.  LABS: CBC: Recent Labs  07/01/14 1040 07/02/14 0400  WBC 4.3 4.1  HGB 8.1* 7.8*  HCT 24.0* 23.8*  MCV 94.1 95.6  PLT 158 0000000   Basic Metabolic  Panel: Recent Labs  07/01/14 1040 07/02/14 0400  NA 140 139  K 5.0 5.1  CL 106 108  CO2 28 25  GLUCOSE 108* 90  BUN 30* 26*  CREATININE 2.27* 2.12*  CALCIUM 8.7* 8.6*   TELE:  SR      Cardiac cath: 07/01/2014 Conclusion    1. Severe multivessel native CAD with 2 of 3 grafts patent and occluded SVG-rPDA. Patent LIMA-~dLAD, SVG-OM. Occluded SVG-rPDA at the origin 2. Ost LAD lesion, 80% stenosed. Mid LAD lesion, 100% stenosed after 2nd septal perforator. The proximal-mid LAD is at least moderately diseased 3. Ramus lesion, 70% stenosed - small caliber, non-PCI amenable 4. Ost Cx to Prox Cx lesion, 99% stenosed. 1st Mrg lesion, 90% stenosed. 5. Ost RPDA to RPDA lesion, 80% stenosed. Similar to pre-CABG. < 2 mm vessel. 6. Potential culprit for abnormal Nuclear Stress Test is the diffusely diseased proximal-mid LAD with diseased septal perforators from LAD & rPDA  No PCI target available to treat the "anterior ischemia" in an asymptomatic patient.  Recommendations:  Post Femoral cath with sheath removal   Aggressive IV fluid hydration overnight  Overnight - outpatient in bed for post cath hydration given CKD.  Continue current med Rx.  Will need to check CMP at the end of the week.   Coronary Findings    Dominance: Right  Left Main  The vessel is large .     Left Anterior Descending  There is severe the vessel.   Colon Flattery LAD lesion, 80% stenosed.   . Mid LAD lesion, 100% stenosed. calcified chronic total occlusion .   [name] First Septal Branch   The vessel is small in size.   [name] Second Septal Branch   The vessel is small in size.     Ramus Intermedius  The vessel is small .   [name] Ramus lesion, 70% stenosed. tubular eccentric . Tapered to small branch.   . Lateral Ramus Intermedius   The vessel is small in size.     Left Circumflex  The vessel is large .   [name] Ost Cx to Prox Cx lesion, 99% stenosed. diffuse located at the major branch .   . Mid Cx lesion, 60%  stenosed. discrete . At this point, the vessel is small caliber   . First Obtuse Marginal Branch   . 1st Mrg lesion, 90% stenosed. tubular . Competitive flow from SVG   . Lateral First Obtuse Marginal Branch   The vessel is small in size.   . Third Obtuse Marginal Branch   The vessel is small in size.     Right Coronary Artery  The vessel is large .   [name] Mid RCA to Dist RCA lesion, 50% stenosed. tubular .   [name] Right Posterior Descending Artery   The vessel is small in size.   Colon Flattery RPDA to RPDA lesion, 80% stenosed. diffuse . Previously grafted, small caliber vessel. < 2 mm   . Right Posterior Atrioventricular Branch   The vessel is moderate in size.   . First Right Posterolateral   The vessel is moderate in size.   [name] Second Right Posterolateral   The vessel is small in size.   . Third Right Posterolateral   The vessel is moderate in size.     Graft Angiography    Free LIMA Graft to Dist LAD  LIMA is and is anatomically normal. Tortuous graft to mid-distal LAD.     Free Graft to 1st Mrg  SVG is normal in caliber. There is competitive flow.     Free Graft to RPDA  SVG   . Origin lesion, 100% stenosed. chronic total occlusion .          Current Medications:  . aspirin  81 mg Oral QHS  . atorvastatin  40 mg Oral QHS  . gabapentin  100 mg Oral TID  . glipiZIDE  2.5 mg Oral BID AC  . loratadine  10 mg Oral Daily  . metoprolol  25 mg Oral BID  . pantoprazole  40 mg Oral Daily  . Vitamin D (Ergocalciferol)  50,000 Units Oral Q7 days      ASSESSMENT AND PLAN: Principal Problem:   Abnormal nuclear stress test: Anterior ischemia, INTERMEDIATE RISK  - med rx - increase BB, add nitrates and see how tolerated - continue other meds.   Active Problems:   Chronic kidney disease (CKD), stage III (moderate) - not on ACE/ARB - PTA Lasix 20 mg qd, hold x 2 days    Essential hypertension - BP/HR should tolerate metoprolol 37.5 mg bid    S/P CABG x 3 - see above     Anemia - H&H are lower than yesterday, but higher than 3 wks ago - 06/14/2014 7.7/23/5 and [facility] Cola - f/u with oncologist    Coronary artery disease involving native coronary artery without  angina pectoris - see above    Hyperlipidemia LDL goal <70 - continue statin  Plan - d/c today  Signed, Rosaria Ferries , PA-C 8:06 AM 07/02/2014   Patient seen and examined. Agree with assessment and plan. No chest pain. H/H low, but similar to [facility] labs from 06/14/14.  Medical therapy for CAD. Not well BB'ed; increase lopressor to 50mg  bid and start nitrates. DC today; f/u with Dr. Ellyn Hack.   Troy Sine, MD, [facility] 07/02/2014 8:23 AM

## 2014-07-02 NOTE — Telephone Encounter (Deleted)
Patient contacted regarding discharge from blank on blank.

## 2014-07-03 NOTE — Telephone Encounter (Signed)
Patient contacted regarding discharge from blank on blank.   Patient understands to follow up with provider  On 07/10/14 at 0900 at [facility]  Patient understands discharge instructions? Yes  Patient understands medications and regiment? Yes  Patient understands to bring all medications to this visit? Yes

## 2014-07-04 ENCOUNTER — Other Ambulatory Visit (HOSPITAL_COMMUNITY): Payer: Self-pay | Admitting: Urology

## 2014-07-04 DIAGNOSIS — N185 Chronic kidney disease, stage 5: Secondary | ICD-10-CM

## 2014-07-04 DIAGNOSIS — C649 Malignant neoplasm of unspecified kidney, except renal pelvis: Secondary | ICD-10-CM

## 2014-07-10 ENCOUNTER — Other Ambulatory Visit: Payer: Medicare Other

## 2014-07-10 ENCOUNTER — Ambulatory Visit (INDEPENDENT_AMBULATORY_CARE_PROVIDER_SITE_OTHER): Payer: Medicare Other | Admitting: Cardiology

## 2014-07-10 ENCOUNTER — Encounter: Payer: Self-pay | Admitting: Cardiology

## 2014-07-10 VITALS — BP 120/70 | HR 75 | Ht 60.0 in | Wt 150.0 lb

## 2014-07-10 DIAGNOSIS — N184 Chronic kidney disease, stage 4 (severe): Secondary | ICD-10-CM | POA: Diagnosis not present

## 2014-07-10 DIAGNOSIS — I10 Essential (primary) hypertension: Secondary | ICD-10-CM | POA: Diagnosis not present

## 2014-07-10 DIAGNOSIS — E1129 Type 2 diabetes mellitus with other diabetic kidney complication: Secondary | ICD-10-CM | POA: Insufficient documentation

## 2014-07-10 DIAGNOSIS — C541 Malignant neoplasm of endometrium: Secondary | ICD-10-CM | POA: Insufficient documentation

## 2014-07-10 DIAGNOSIS — Z79899 Other long term (current) drug therapy: Secondary | ICD-10-CM | POA: Diagnosis not present

## 2014-07-10 DIAGNOSIS — R9439 Abnormal result of other cardiovascular function study: Secondary | ICD-10-CM

## 2014-07-10 DIAGNOSIS — R931 Abnormal findings on diagnostic imaging of heart and coronary circulation: Secondary | ICD-10-CM | POA: Diagnosis not present

## 2014-07-10 DIAGNOSIS — E785 Hyperlipidemia, unspecified: Secondary | ICD-10-CM

## 2014-07-10 DIAGNOSIS — Z951 Presence of aortocoronary bypass graft: Secondary | ICD-10-CM | POA: Diagnosis not present

## 2014-07-10 DIAGNOSIS — N058 Unspecified nephritic syndrome with other morphologic changes: Secondary | ICD-10-CM

## 2014-07-10 DIAGNOSIS — A4151 Sepsis due to Escherichia coli [E. coli]: Secondary | ICD-10-CM

## 2014-07-10 LAB — BASIC METABOLIC PANEL
BUN: 46 mg/dL — ABNORMAL HIGH (ref 6–23)
CO2: 25 mEq/L (ref 19–32)
Calcium: 9.2 mg/dL (ref 8.4–10.5)
Chloride: 106 mEq/L (ref 96–112)
Creatinine, Ser: 2.52 mg/dL — ABNORMAL HIGH (ref 0.40–1.20)
GFR: 20.07 mL/min — ABNORMAL LOW (ref >60.00)
Glucose, Bld: 101 mg/dL — ABNORMAL HIGH (ref 70–99)
Potassium: 5.2 mEq/L — ABNORMAL HIGH (ref 3.5–5.1)
Sodium: 137 mEq/L (ref 135–145)

## 2014-07-10 LAB — CBC
HCT: 25.8 % — ABNORMAL LOW (ref 36.0–46.0)
Hemoglobin: 8.9 g/dL — ABNORMAL LOW (ref 12.0–15.0)
MCHC: 34.2 g/dL (ref 30.0–36.0)
MCV: 95.7 fl (ref 78.0–100.0)
Platelets: 217 10*3/uL (ref 150.0–400.0)
RBC: 2.7 Mil/uL — ABNORMAL LOW (ref 3.87–5.11)
RDW: 22.5 % — ABNORMAL HIGH (ref 11.5–15.5)
WBC: 7.2 10*3/uL (ref 4.0–10.5)

## 2014-07-10 NOTE — Assessment & Plan Note (Signed)
Nov 2015 at [facility]- surgery, chemo, radiation.

## 2014-07-10 NOTE — Assessment & Plan Note (Signed)
LIMA to LAD, SVG to OM, SVG to RPL, EVH via right thigh and leg

## 2014-07-10 NOTE — Assessment & Plan Note (Signed)
Type 2 NIDDM with CRI

## 2014-07-10 NOTE — Assessment & Plan Note (Signed)
Cath done 07/02/14- occluded SVG-PDA. Plan is for medical Rx.

## 2014-07-10 NOTE — Assessment & Plan Note (Signed)
Due for lipids.  

## 2014-07-10 NOTE — Patient Instructions (Signed)
Medication Instructions:  Your physician recommends that you continue on your current medications as directed. Please refer to the Current Medication list given to you today.  Labwork: Please have CBC and BMP today.  Follow-Up: Follow up with Dr Ellyn Hack as scheduled.  Thank you for choosing [facility]!!

## 2014-07-10 NOTE — Assessment & Plan Note (Signed)
Treated- June 2016

## 2014-07-10 NOTE — Progress Notes (Signed)
07/10/2014 Dana Jackson   [birth year]  [id number]  Primary Physician Dana Shire, MD Primary Cardiologist: Dana Dana Jackson  HPI:  69 y/o female with a history of CABG x 3 in 2012. She and her husband help care for 3 adopted grandchildren ages 34,3,and 46. She has chronic renal insufficiency followed by Dana Jackson. She had a partial nephrectomy in 2010 (Dana Dana Jackson). In Nov 2015 she was diagnosed with endometrial Jackson. She was treated at [facility]- surgery, chemo, radiation. In June she presented with sepsis and was transferred from [facility] to [facility] for treatment. There she apparently had a positive Troponin and underwent Myoview which was abnormal. She was referred back to Dana Dana Jackson for cath. This was done as an OP 07/01/14. The pt has no complaints of chest pain. Cath reveled an occluded SVG-PDA and the plan is for medical Rx.    Current Outpatient Prescriptions  Medication Sig Dispense Refill  . Alpha-Lipoic Acid 300 MG TABS Take 600 mg by mouth daily.    [name] aspirin 81 MG tablet Take 81 mg by mouth daily.      [name] atorvastatin (LIPITOR) 40 MG tablet Take 40 mg by mouth at bedtime.     . docusate sodium (COLACE) 100 MG capsule Take 100 mg by mouth daily as needed for mild constipation.    . furosemide (LASIX) 20 MG tablet Take 20 mg by mouth daily.    [name] gabapentin (NEURONTIN) 100 MG capsule Take 100 mg by mouth 3 (three) times daily.    [name] glipiZIDE (GLUCOTROL) 5 MG tablet Take 2.5 mg by mouth 2 (two) times daily before a meal.     . isosorbide mononitrate (IMDUR) 30 MG 24 hr tablet Take 1 tablet (30 mg total) by mouth daily. 30 tablet 11  . loperamide (IMODIUM) 2 MG capsule Take 2 mg by mouth 4 (four) times daily as needed for diarrhea or loose stools.    [name] loratadine (CLARITIN) 10 MG tablet Take 10 mg by mouth daily.    . magnesium oxide (MAG-OX) 400 MG tablet Take 400 mg by mouth daily.    . metoprolol (LOPRESSOR) 50 MG tablet Take 1 tablet (50 mg total) by mouth 2 (two) times daily. 180 tablet 3  .  nitroGLYCERIN (NITROSTAT) 0.4 MG SL tablet Place 1 tablet (0.4 mg total) under the tongue every 5 (five) minutes as needed for chest pain. 25 tablet 3  . nystatin (MYCOSTATIN) powder Apply 1 g topically daily as needed.     [name] omeprazole (PRILOSEC) 40 MG capsule Take 40 mg by mouth daily.      . ondansetron (ZOFRAN) 4 MG tablet Take 4 mg by mouth every 8 (eight) hours as needed for nausea or vomiting.    . Probiotic Product (ALIGN PO) Take 1 each by mouth daily.    . prochlorperazine (COMPAZINE) 10 MG tablet Take 10 mg by mouth every 6 (six) hours as needed for nausea or vomiting.    . promethazine (PHENERGAN) 25 MG tablet Take 25 mg by mouth every 6 (six) hours as needed for nausea or vomiting.    . Vitamin D, Ergocalciferol, (DRISDOL) 50000 UNITS CAPS capsule Take 50,000 Units by mouth every 7 (seven) days.     No current facility-administered medications for this visit.    Allergies  Allergen Reactions  . Codeine Nausea Only  . Erythromycin Itching  . Lopid [Gemfibrozil] Itching    History   Social History  . Marital Status: Married    Spouse Name: Dana Jackson  .  Number of Children: 2  . Years of Education: many   Occupational History  . 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     Social History Main Topics  . Smoking status: Never Smoker   . Smokeless tobacco: Never Used  . Alcohol Use: 0.0 oz/week    0 Standard drinks or equivalent per week     Comment: "mixed drink once a year"  . Drug Use: No  . Sexual Activity:    Partners: Male     Comment: husband   Other Topics Concern  . Not on file   Social History Narrative   She is a married Mother of 2.  -- Currently being very busy taking care of 2 foster children that are staying with her daughter.  A 20-year-old and a 61-year-old that they're trying to adopt.  She is very excited about the possibility of becoming a Grandmother.   She does walk and getting exercise, but she is wanting to get back into  more activities just because she has really been limited due to her arthritic pains. She used to do things like walking and biking, and she may try to do some biking again, or at least stationary biking.    Does not smoke, does not drink.     Review of Systems: General: negative for chills, fever, night sweats or weight changes.  Cardiovascular: negative for chest pain, dyspnea on exertion, edema, orthopnea, palpitations, paroxysmal nocturnal dyspnea or shortness of breath Dermatological: negative for rash Respiratory: negative for cough or wheezing Urologic: negative for hematuria Abdominal: negative for nausea, vomiting, diarrhea, bright red blood per rectum, melena, or hematemesis Neurologic: negative for visual changes, syncope, or dizziness All other systems reviewed and are otherwise negative except as noted above.    Blood pressure 120/70, pulse 75, height 5' (1.524 m), weight 150 lb (68.04 kg), SpO2 98 %.  General appearance: alert, cooperative and no distress Neck: no carotid bruit and no JVD Lungs: clear to auscultation bilaterally Heart: regular rate and rhythm Extremities: Rt groin wihtout hematoma Skin: pale, cool, dry   ASSESSMENT AND PLAN:   S/P CABG x 3 LIMA to LAD, SVG to OM, SVG to RPL, EVH via right thigh and leg  Abnormal nuclear stress test: Anterior ischemia, INTERMEDIATE RISK  Cath done 07/02/14- occluded SVG-PDA. Plan is for medical Rx.  Chronic kidney disease (CKD), stage IV (severe) Baseline Cr ~2.0-2.5  Type 2 diabetes mellitus with renal manifestations, controlled Type 2 NIDDM with CRI  Essential hypertension Controlled  Hyperlipidemia LDL goal <70 Due for lipids  Endometrial Jackson Nov 2015 at [facility]- surgery, chemo, radiation.   Sepsis due to Escherichia coli Treated- June 2016   PLAN  I ordered a CBC (pt is pale) and a f/u BMP. Her lipids are followed by Dana Dana Jackson. She has a follow up with Dana Dana Jackson next month.  Dana Ransom K  PA-C 07/10/2014 9:25 AM

## 2014-07-10 NOTE — Assessment & Plan Note (Signed)
Controlled.  

## 2014-07-10 NOTE — Assessment & Plan Note (Signed)
Baseline Cr ~2.0-2.5

## 2014-07-17 ENCOUNTER — Encounter: Payer: Self-pay | Admitting: Cardiovascular Disease

## 2014-07-17 ENCOUNTER — Encounter: Payer: Self-pay | Admitting: *Deleted

## 2014-07-22 DIAGNOSIS — N764 Abscess of vulva: Secondary | ICD-10-CM | POA: Insufficient documentation

## 2014-07-24 ENCOUNTER — Ambulatory Visit: Payer: Medicare Other | Admitting: Cardiology

## 2014-07-29 ENCOUNTER — Inpatient Hospital Stay (HOSPITAL_COMMUNITY)
Admission: EM | Admit: 2014-07-29 | Discharge: 2014-08-01 | DRG: 682 | Disposition: A | Discharge status: Home / Self Care | Payer: Medicare Other | Attending: Family Medicine | Admitting: Family Medicine

## 2014-07-29 ENCOUNTER — Encounter (HOSPITAL_COMMUNITY): Payer: Self-pay

## 2014-07-29 DIAGNOSIS — N183 Chronic kidney disease, stage 3 (moderate): Secondary | ICD-10-CM | POA: Diagnosis present

## 2014-07-29 DIAGNOSIS — R944 Abnormal results of kidney function studies: Secondary | ICD-10-CM | POA: Diagnosis present

## 2014-07-29 DIAGNOSIS — E785 Hyperlipidemia, unspecified: Secondary | ICD-10-CM | POA: Diagnosis present

## 2014-07-29 DIAGNOSIS — N189 Chronic kidney disease, unspecified: Secondary | ICD-10-CM

## 2014-07-29 DIAGNOSIS — N764 Abscess of vulva: Secondary | ICD-10-CM | POA: Diagnosis present

## 2014-07-29 DIAGNOSIS — Z881 Allergy status to other antibiotic agents status: Secondary | ICD-10-CM

## 2014-07-29 DIAGNOSIS — J45909 Unspecified asthma, uncomplicated: Secondary | ICD-10-CM | POA: Diagnosis present

## 2014-07-29 DIAGNOSIS — I739 Peripheral vascular disease, unspecified: Secondary | ICD-10-CM | POA: Diagnosis present

## 2014-07-29 DIAGNOSIS — B9562 Methicillin resistant Staphylococcus aureus infection as the cause of diseases classified elsewhere: Secondary | ICD-10-CM | POA: Diagnosis present

## 2014-07-29 DIAGNOSIS — M1990 Unspecified osteoarthritis, unspecified site: Secondary | ICD-10-CM | POA: Diagnosis present

## 2014-07-29 DIAGNOSIS — I129 Hypertensive chronic kidney disease with stage 1 through stage 4 chronic kidney disease, or unspecified chronic kidney disease: Secondary | ICD-10-CM | POA: Diagnosis present

## 2014-07-29 DIAGNOSIS — E1169 Type 2 diabetes mellitus with other specified complication: Secondary | ICD-10-CM | POA: Diagnosis present

## 2014-07-29 DIAGNOSIS — N179 Acute kidney failure, unspecified: Secondary | ICD-10-CM | POA: Diagnosis present

## 2014-07-29 DIAGNOSIS — C541 Malignant neoplasm of endometrium: Secondary | ICD-10-CM | POA: Diagnosis present

## 2014-07-29 DIAGNOSIS — E1122 Type 2 diabetes mellitus with diabetic chronic kidney disease: Secondary | ICD-10-CM | POA: Diagnosis present

## 2014-07-29 DIAGNOSIS — Z8701 Personal history of pneumonia (recurrent): Secondary | ICD-10-CM

## 2014-07-29 DIAGNOSIS — I25810 Atherosclerosis of coronary artery bypass graft(s) without angina pectoris: Secondary | ICD-10-CM | POA: Diagnosis present

## 2014-07-29 DIAGNOSIS — Z85528 Personal history of other malignant neoplasm of kidney: Secondary | ICD-10-CM

## 2014-07-29 DIAGNOSIS — Z888 Allergy status to other drugs, medicaments and biological substances status: Secondary | ICD-10-CM

## 2014-07-29 DIAGNOSIS — Z8249 Family history of ischemic heart disease and other diseases of the circulatory system: Secondary | ICD-10-CM

## 2014-07-29 DIAGNOSIS — K219 Gastro-esophageal reflux disease without esophagitis: Secondary | ICD-10-CM | POA: Diagnosis present

## 2014-07-29 DIAGNOSIS — Z823 Family history of stroke: Secondary | ICD-10-CM

## 2014-07-29 DIAGNOSIS — E1129 Type 2 diabetes mellitus with other diabetic kidney complication: Secondary | ICD-10-CM | POA: Diagnosis present

## 2014-07-29 DIAGNOSIS — Z8673 Personal history of transient ischemic attack (TIA), and cerebral infarction without residual deficits: Secondary | ICD-10-CM

## 2014-07-29 DIAGNOSIS — C649 Malignant neoplasm of unspecified kidney, except renal pelvis: Secondary | ICD-10-CM | POA: Diagnosis present

## 2014-07-29 DIAGNOSIS — Z7982 Long term (current) use of aspirin: Secondary | ICD-10-CM

## 2014-07-29 DIAGNOSIS — Z885 Allergy status to narcotic agent status: Secondary | ICD-10-CM

## 2014-07-29 DIAGNOSIS — I10 Essential (primary) hypertension: Secondary | ICD-10-CM | POA: Diagnosis present

## 2014-07-29 DIAGNOSIS — Z79899 Other long term (current) drug therapy: Secondary | ICD-10-CM

## 2014-07-29 DIAGNOSIS — D5700 Hb-SS disease with crisis, unspecified: Secondary | ICD-10-CM | POA: Diagnosis present

## 2014-07-29 DIAGNOSIS — E875 Hyperkalemia: Secondary | ICD-10-CM | POA: Diagnosis present

## 2014-07-29 LAB — CBC WITH DIFFERENTIAL/PLATELET
Basophils Absolute: 0 10*3/uL (ref 0.0–0.1)
Basophils Relative: 0 % (ref 0–1)
EOS PCT: 4 % (ref 0–5)
Eosinophils Absolute: 0.2 10*3/uL (ref 0.0–0.7)
HCT: 24.7 % — ABNORMAL LOW (ref 36.0–46.0)
HEMOGLOBIN: 8.4 g/dL — AB (ref 12.0–15.0)
LYMPHS ABS: 2.1 10*3/uL (ref 0.7–4.0)
Lymphocytes Relative: 39 % (ref 12–46)
MCH: 33.1 pg (ref 26.0–34.0)
MCHC: 34 g/dL (ref 30.0–36.0)
MCV: 97.2 fL (ref 78.0–100.0)
MONOS PCT: 8 % (ref 3–12)
Monocytes Absolute: 0.5 10*3/uL (ref 0.1–1.0)
Neutro Abs: 2.6 10*3/uL (ref 1.7–7.7)
Neutrophils Relative %: 49 % (ref 43–77)
Platelets: 166 10*3/uL (ref 150–400)
RBC: 2.54 MIL/uL — ABNORMAL LOW (ref 3.87–5.11)
RDW: 17.2 % — ABNORMAL HIGH (ref 11.5–15.5)
WBC: 5.3 10*3/uL (ref 4.0–10.5)

## 2014-07-29 LAB — BASIC METABOLIC PANEL
Anion gap: 6 (ref 5–15)
BUN: 62 mg/dL — ABNORMAL HIGH (ref 6–20)
CHLORIDE: 108 mmol/L (ref 101–111)
CO2: 22 mmol/L (ref 22–32)
Calcium: 8.8 mg/dL — ABNORMAL LOW (ref 8.9–10.3)
Creatinine, Ser: 3.27 mg/dL — ABNORMAL HIGH (ref 0.44–1.00)
GFR calc non Af Amer: 13 mL/min — ABNORMAL LOW (ref >60)
GFR, EST AFRICAN AMERICAN: 16 mL/min — AB (ref >60)
Glucose, Bld: 115 mg/dL — ABNORMAL HIGH (ref 65–99)
POTASSIUM: 5.5 mmol/L — AB (ref 3.5–5.1)
Sodium: 136 mmol/L (ref 135–145)

## 2014-07-29 LAB — GLUCOSE, CAPILLARY: Glucose-Capillary: 95 mg/dL (ref 65–99)

## 2014-07-29 MED ORDER — HYDROCODONE-ACETAMINOPHEN 5-325 MG PO TABS: 1.0000 | ORAL_TABLET | ORAL | Status: DC | PRN

## 2014-07-29 MED ORDER — ASPIRIN 81 MG PO CHEW
81.0000 mg | CHEWABLE_TABLET | Freq: Every day | ORAL | Status: DC
  Administered 2014-07-30 – 2014-08-01 (×3): 81 mg via ORAL
  Filled 2014-07-29 (×3): qty 1

## 2014-07-29 MED ORDER — GABAPENTIN 100 MG PO CAPS
100.0000 mg | ORAL_CAPSULE | Freq: Three times a day (TID) | ORAL | Status: DC
  Administered 2014-07-29 – 2014-08-01 (×8): 100 mg via ORAL
  Filled 2014-07-29 (×9): qty 1

## 2014-07-29 MED ORDER — METOPROLOL TARTRATE 50 MG PO TABS
50.0000 mg | ORAL_TABLET | Freq: Two times a day (BID) | ORAL | Status: DC
  Administered 2014-07-29 – 2014-08-01 (×6): 50 mg via ORAL
  Filled 2014-07-29 (×6): qty 1

## 2014-07-29 MED ORDER — LOPERAMIDE HCL 2 MG PO CAPS: 2.0000 mg | ORAL_CAPSULE | Freq: Four times a day (QID) | ORAL | Status: DC | PRN

## 2014-07-29 MED ORDER — DOXYCYCLINE HYCLATE 100 MG PO TABS
100.0000 mg | ORAL_TABLET | Freq: Two times a day (BID) | ORAL | Status: DC
Start: 2014-07-29 — End: 2014-08-01
  Administered 2014-07-29 – 2014-08-01 (×6): 100 mg via ORAL
  Filled 2014-07-29 (×6): qty 1

## 2014-07-29 MED ORDER — LORATADINE 10 MG PO TABS
10.0000 mg | ORAL_TABLET | Freq: Every day | ORAL | Status: DC
  Administered 2014-07-30 – 2014-08-01 (×3): 10 mg via ORAL
  Filled 2014-07-29 (×3): qty 1

## 2014-07-29 MED ORDER — INSULIN ASPART 100 UNIT/ML ~~LOC~~ SOLN: 0.0000 [IU] | Freq: Three times a day (TID) | SUBCUTANEOUS | Status: DC

## 2014-07-29 MED ORDER — ISOSORBIDE MONONITRATE ER 30 MG PO TB24
30.0000 mg | ORAL_TABLET | Freq: Every day | ORAL | Status: DC
  Administered 2014-07-30 – 2014-08-01 (×3): 30 mg via ORAL
  Filled 2014-07-29 (×3): qty 1

## 2014-07-29 MED ORDER — HEPARIN SODIUM (PORCINE) 5000 UNIT/ML IJ SOLN
5000.0000 [IU] | Freq: Three times a day (TID) | INTRAMUSCULAR | Status: DC
  Administered 2014-07-29 – 2014-08-01 (×8): 5000 [IU] via SUBCUTANEOUS
  Filled 2014-07-29 (×9): qty 1

## 2014-07-29 MED ORDER — ONDANSETRON HCL 4 MG PO TABS: 4.0000 mg | ORAL_TABLET | Freq: Four times a day (QID) | ORAL | Status: DC | PRN

## 2014-07-29 MED ORDER — SODIUM CHLORIDE 0.9 % IV SOLN
INTRAVENOUS | Status: DC
  Administered 2014-07-29 – 2014-07-31 (×7): via INTRAVENOUS

## 2014-07-29 MED ORDER — ATORVASTATIN CALCIUM 40 MG PO TABS
40.0000 mg | ORAL_TABLET | Freq: Every day | ORAL | Status: DC
  Administered 2014-07-29 – 2014-07-31 (×3): 40 mg via ORAL
  Filled 2014-07-29 (×3): qty 1

## 2014-07-29 MED ORDER — MAGNESIUM OXIDE 400 (241.3 MG) MG PO TABS
400.0000 mg | ORAL_TABLET | Freq: Every day | ORAL | Status: DC
  Administered 2014-07-30 – 2014-08-01 (×3): 400 mg via ORAL
  Filled 2014-07-29 (×3): qty 1

## 2014-07-29 MED ORDER — ONDANSETRON HCL 4 MG/2ML IJ SOLN: 4.0000 mg | Freq: Four times a day (QID) | INTRAMUSCULAR | Status: DC | PRN

## 2014-07-29 MED ORDER — GLIPIZIDE 2.5 MG HALF TABLET
2.5000 mg | ORAL_TABLET | Freq: Every day | ORAL | Status: DC
  Administered 2014-07-30 – 2014-08-01 (×2): 2.5 mg via ORAL
  Filled 2014-07-29 (×3): qty 1

## 2014-07-29 MED ORDER — ALIGN PO CAPS
1.0000 | ORAL_CAPSULE | Freq: Every day | ORAL | Status: DC
Start: 2014-07-30 — End: 2014-08-01
  Administered 2014-07-30 – 2014-08-01 (×3): 1 via ORAL
  Filled 2014-07-29 (×3): qty 1

## 2014-07-29 MED ORDER — ONDANSETRON HCL 4 MG PO TABS
4.0000 mg | ORAL_TABLET | Freq: Three times a day (TID) | ORAL | Status: DC | PRN
Start: 2014-07-29 — End: 2014-07-29

## 2014-07-29 MED ORDER — PANTOPRAZOLE SODIUM 40 MG PO TBEC
40.0000 mg | DELAYED_RELEASE_TABLET | Freq: Every day | ORAL | Status: DC
  Administered 2014-07-30 – 2014-08-01 (×3): 40 mg via ORAL
  Filled 2014-07-29 (×4): qty 1

## 2014-07-29 MED ORDER — INSULIN ASPART 100 UNIT/ML ~~LOC~~ SOLN: 0.0000 [IU] | Freq: Every day | SUBCUTANEOUS | Status: DC

## 2014-07-29 MED ORDER — NITROGLYCERIN 0.4 MG SL SUBL: 0.4000 mg | SUBLINGUAL_TABLET | SUBLINGUAL | Status: DC | PRN

## 2014-07-29 MED ORDER — DOCUSATE SODIUM 100 MG PO CAPS: 100.0000 mg | ORAL_CAPSULE | Freq: Every day | ORAL | Status: DC | PRN

## 2014-07-29 MED ORDER — GLIPIZIDE 2.5 MG HALF TABLET: 2.5000 mg | ORAL_TABLET | Freq: Two times a day (BID) | ORAL | Status: DC | PRN

## 2014-07-29 MED ORDER — GLIPIZIDE 2.5 MG HALF TABLET
2.5000 mg | ORAL_TABLET | Freq: Every day | ORAL | Status: DC
  Administered 2014-07-31: 2.5 mg via ORAL
  Filled 2014-07-29 (×3): qty 1

## 2014-07-29 NOTE — H&P (Signed)
PCP:   Stephens Shire, MD   Chief Complaint: Abnormal labs.   HPI: This a 69 year old female who saw her PCP today and routine lab work done revealed elevated creatinine and potassium. She has a history of endometrial cancer, she has undergone and completed the treatment in June 2016. She had a follow up with her oncologist in [facility] today. Her routine labs were done. Her creatinine was elevated. She was sent to ER. The patient is on Bactrim today was day 7 of 10. This was started because she has a MRSA infection in her groin. She has an ulcer on the right labia. It is almost healed, her husband is packing it. She has chronic renal failure with a baseline creatinine of 2.5. Today to 3.27. Her potassium is also 5.5. Her UOP is normal   Review of Systems:  The patient denies anorexia, fever, weight loss,, vision loss, decreased hearing, hoarseness, chest pain, syncope, dyspnea on exertion, peripheral edema, balance deficits, hemoptysis, abdominal pain, melena, hematochezia, severe indigestion/heartburn, hematuria, incontinence, genital sore, muscle weakness, suspicious skin lesions, transient blindness, difficulty walking, depression, unusual weight change, abnormal bleeding, enlarged lymph nodes, angioedema, and breast masses.  Past Medical History: Past Medical History  Diagnosis Date  . 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  95% ostial circumflex, 80-90% PDA  . 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 History of unstable angina November/December 2012    T wave inversions in inferolateral leads.  No stress test performed.  [name] TIA (transient ischemic attack) 1992 &  2010  . Hypertension, essential, benign   . Hyperlipidemia LDL goal <70   . CKD (chronic kidney disease) stage 3, GFR 30-59 ml/min   . GERD (gastroesophageal reflux disease)   . Arthritis   . History of asthma    childhood  . History of pneumonia     "2-3 times"  . Degenerative disc disease, cervical     Degenerative disc disease, cervical [722.4]  . Stroke     TIA  . Asthma     childhood  . Pneumonia     walking  . Renal cell carcinoma 11/18/2008    T2aNx s/p partial left nephrectomy  . Endometrial cancer November 2015    Treated with TAH with pelvic lymphadenectomy followed by radiation and chemotherapy  . Bacteremia due to Escherichia coli June 2015     Currently being treated with anti-biotics   Past Surgical History  Procedure Laterality Date  . Partial nephrectomy Left 11/18/2008    left partial nephrectomy for renal cell CA  . Anterior cervical decomp/discectomy fusion  10/11/2005    multi-level  . Cardiac catheterization  12/24/10    40% left main, 80% mid LAD, 95% ostial circumflex, 80-90% PDA.  [name] Tonsillectomy      "in college"  . Cholecystectomy  ~ 1971  . Tubal ligation  ~ 1984  . Coronary artery bypass graft  12/25/2010    Procedure: CORONARY ARTERY BYPASS GRAFTING (CABGX3 - LIMA-LAD, SVG RPL, SVG-Circumflex);  Surgeon: Rexene Alberts, MD;  Location: [place];  Service: Open Heart Surgery;  Laterality: N/A;  . Carotid endarterectomy Left   . Colonoscopy    . Upper gi endoscopy    . Wisdom tooth extraction    . Dilatation & curettage/hysteroscopy with myosure N/A 11/02/2013    Procedure: DILATATION & CURETTAGE/HYSTEROSCOPY WITH MYOSURE ABLATION;  Surgeon: Allena Katz, MD;  Location: [facility];  Service: Gynecology;  Laterality: N/A;  . Left heart catheterization with coronary angiogram N/A 12/24/2010    Procedure: LEFT HEART CATHETERIZATION WITH CORONARY ANGIOGRAM;  Surgeon: Leonie Man, MD;  Location: [facility] C[facility] LAB;  Service: Cardiovascular;  Laterality: N/A;  . Total abdominal hysterectomy  November 2015     At [facility]: Robotic procedure with pelvic lymphadenectomy  . Transthoracic echocardiogram  06/13/2014    At [place]: EF 60-65%. Grade 1 diastolic  dysfunction. Mild MR. Aortic sclerosis. Moderate pulmonary hypertension  . Pet myocardial perfusion scan  06/13/2014    At [place]: Moderate size, mild severity completely reversible defect involving the basal anterior, mid anterior and apical anterior segments consistent with ischemia. EF 65% with normal global function.  . Cardiac catheterization N/A 07/01/2014    Procedure: Left Heart Cath and Cors/Grafts Angiography;  Surgeon: Leonie Man, MD;  Location: [place] CV LAB;  Service: Cardiovascular;  Laterality: N/A;  . Doppler echocardiography  12/08/2010    EF =>55%,MILD CONCENTRIC LEFT VENTRICULAR HYPERTROPHY  . Nm myocar single w/spect  07/26/2007    EF 79%, LEFT VENT.FUNCTION NORMAL    Medications: Prior to Admission medications   Medication Sig Start Date End Date Taking? Authorizing Provider  Alpha-Lipoic Acid 600 MG CAPS Take 600 mg by mouth daily.   Yes Historical Provider, MD  aspirin 81 MG tablet Take 81 mg by mouth daily.     Yes Historical Provider, MD  atorvastatin (LIPITOR) 40 MG tablet Take 40 mg by mouth at bedtime.    Yes Historical Provider, MD  docusate sodium (COLACE) 100 MG capsule Take 100 mg by mouth daily as needed for mild constipation.   Yes Historical Provider, MD  furosemide (LASIX) 20 MG tablet Take 20 mg by mouth daily.   Yes Historical Provider, MD  gabapentin (NEURONTIN) 100 MG capsule Take 100 mg by mouth 3 (three) times daily.   Yes Historical Provider, MD  glipiZIDE (GLUCOTROL) 5 MG tablet Take 2.5 mg by mouth 2 (two) times daily as needed (only take in the morning if BS is above 75 and always take evening dose).    Yes Historical Provider, MD  isosorbide mononitrate (IMDUR) 30 MG 24 hr tablet Take 1 tablet (30 mg total) by mouth daily. 07/02/14  Yes Rhonda G Barrett, PA-C  loperamide (IMODIUM) 2 MG capsule Take 2 mg by mouth 4 (four) times daily as needed for diarrhea or loose stools.   Yes Historical Provider, MD  loratadine (CLARITIN) 10 MG  tablet Take 10 mg by mouth daily.   Yes Historical Provider, MD  magnesium oxide (MAG-OX) 400 MG tablet Take 400 mg by mouth daily.   Yes Historical Provider, MD  metoprolol (LOPRESSOR) 50 MG tablet Take 1 tablet (50 mg total) by mouth 2 (two) times daily. 07/02/14  Yes Rhonda G Barrett, PA-C  nitroGLYCERIN (NITROSTAT) 0.4 MG SL tablet Place 1 tablet (0.4 mg total) under the tongue every 5 (five) minutes as needed for chest pain. 09/03/13  Yes Leonie Man, MD  nystatin (MYCOSTATIN) powder Apply 1 g topically daily as needed (breakout yeast infection).  06/14/14 06/14/15 Yes Historical Provider, MD  omeprazole (PRILOSEC) 40 MG capsule Take 40 mg by mouth daily.     Yes Historical Provider, MD  ondansetron (ZOFRAN) 4 MG tablet Take 4 mg by mouth every 8 (eight) hours as needed for nausea or vomiting.   Yes Historical Provider, MD  Probiotic Product (ALIGN PO) Take 1 each by mouth daily.   Yes Historical Provider, MD  prochlorperazine (COMPAZINE) 10 MG tablet Take 10 mg by mouth every 6 (six) hours as needed for nausea or vomiting.   Yes Historical Provider, MD  promethazine (PHENERGAN) 25 MG tablet Take 25 mg by mouth every 6 (six) hours as needed for nausea or vomiting.   Yes Historical Provider, MD  Pyridoxine HCl (VITAMIN B-6 PO) Take 2 tablets by mouth daily.   Yes Historical Provider, MD  sulfamethoxazole-trimethoprim (BACTRIM,SEPTRA) 400-80 MG per tablet Take 1 tablet by mouth 2 (two) times daily. for 10 days 07/21/14 07/31/14 Yes Historical Provider, MD  Vitamin D, Ergocalciferol, (DRISDOL) 50000 UNITS CAPS capsule Take 50,000 Units by mouth every 7 (seven) days. Wednesday   Yes Historical Provider, MD    Allergies:   Allergies  Allergen Reactions  . Codeine Nausea Only  . Erythromycin Itching  . Lopid [Gemfibrozil] Itching  . Pravastatin Other (See Comments)    Used 01/2011 to 03/2011  . Simvastatin Other (See Comments)    Used 07/2007 to 11/2010 chg to pravastatin    Social History:   reports that she has never smoked. She has never used smokeless tobacco. She reports that she drinks alcohol. She reports that she does not use illicit drugs.  Family History: Family History  Problem Relation Age of Onset  . Coronary artery disease Father   . Coronary artery disease Sister   . Coronary artery disease Brother   . Heart attack Neg Hx   . Stroke Maternal Grandmother     Physical Exam: Filed Vitals:   07/29/14 1922  BP: 125/55  Pulse: 81  Temp: 98.3 F (36.8 C)  TempSrc: Oral  Resp: 20  SpO2: 98%    General:  Alert and oriented times three, well developed and nourished, no acute distress Eyes: PERRLA, pink conjunctiva, no scleral icterus ENT: Moist oral mucosa, neck supple, no thyromegaly Lungs: clear to ascultation, no wheeze, no crackles, no use of accessory muscles Cardiovascular: regular rate and rhythm, no regurgitation, no gallops, no murmurs. No carotid bruits, no JVD Abdomen: soft, positive BS, non-tender, non-distended, no organomegaly, not an acute abdomen GU: small ulcer on bottom of right labia Neuro: CN II - XII grossly intact, sensation intact Musculoskeletal: strength 5/5 all extremities, no clubbing, cyanosis or edema Skin: no rash, no subcutaneous crepitation, no decubitus Psych: appropriate patient   Labs on Admission:   Recent Labs  07/29/14 1955  NA 136  K 5.5*  CL 108  CO2 22  GLUCOSE 115*  BUN 62*  CREATININE 3.27*  CALCIUM 8.8*   No results for input(s): AST, ALT, ALKPHOS, BILITOT, PROT, ALBUMIN in the last 72 hours. No results for input(s): LIPASE, AMYLASE in the last 72 hours.  Recent Labs  07/29/14 1955  WBC 5.3  NEUTROABS 2.6  HGB 8.4*  HCT 24.7*  MCV 97.2  PLT 166    Invalid input(s): FREET3 No results for input(s): VITAMINB12, FOLATE, FERRITIN, TIBC, IRON, RETICCTPCT in the last 72 hours.  Micro Results: No results found for this or any previous visit (from the past 240 hour(s)).   Radiological Exams on  Admission: No results found.  Assessment/Plan Present on Admission:  . Acute-on-chronic kidney injury -bring in for 23 hour observation -IVF hydration, repeat BMP in AM -likely d/t bactrim which will be stopped . Hyperkalemia  -repeat BMP in 4 hours will likely improve with hydration Groin ulcer MRSA -nursing to change dressing -d/c bactrim,  substitute doxycycline . Endometrial cancer . Essential hypertension . Hyperlipidemia LDL goal <70 . Renal cell carcinoma . Type 2 diabetes mellitus with renal manifestations, controlled -ADA diet, SSI -resume home medications   Vidhi Delellis 07/29/2014, 9:27 PM

## 2014-07-29 NOTE — ED Notes (Signed)
Pt had labs drawn at her Cancer doctor today at [facility] and was called with abnormal values when she got home, creatinine at 3.08 and K at 5.4 and was told to come here for follow up

## 2014-07-29 NOTE — ED Provider Notes (Signed)
CSN: [id number]     Arrival date & time 07/29/14  1857 History   First MD Initiated Contact with Patient 07/29/14 1944     Chief Complaint  Patient presents with  . Abnormal Lab     (Consider location/radiation/quality/duration/timing/severity/associated sxs/prior Treatment) The history is provided by the patient and medical records.    This is a 69 year old female with history of hypertension, stroke, GERD, chronic kidney disease, hyperlipidemia, endometrial cancer status post chemotherapy and radiation, presenting to the ED for abnormal labs. Patient states she was seen earlier today by her oncologist in [place]. She does report that her chemotherapy was cut short by 2 cycles due to infected central line in her left groin. She did have an abscess at site of catheter insertion which was incised.  Culture was MRSA +, she is currently on bactrim which she will finish on Wednesday.  She states overall she has been feeling well. No nausea, vomiting, abdominal pain, chest pain, shortness of breath, fever or chills. Her screening lab work today revealed an elevated potassium and elevated serum creatinine. Patient does have history of chronic kidney disease, states her values are usually between 2.0 - 2.5.  She states her oncologist told her to come to nearest ED for IVF.  Patient has no complaints at present.  VSS.  Past Medical History  Diagnosis Date  . 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  95% ostial circumflex, 80-90% PDA  . 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 History of unstable angina November/December 2012    T wave inversions in inferolateral leads.  No stress test performed.  [name] TIA (transient ischemic attack) 1992 &  2010  . Hypertension, essential, benign   . Hyperlipidemia LDL goal <70   . CKD (chronic kidney disease) stage 3, GFR 30-59 ml/min   . GERD (gastroesophageal  reflux disease)   . Arthritis   . History of asthma      childhood  . History of pneumonia     "2-3 times"  . Degenerative disc disease, cervical     Degenerative disc disease, cervical [722.4]  . Stroke     TIA  . Asthma     childhood  . Pneumonia     walking  . Renal cell carcinoma 11/18/2008    T2aNx s/p partial left nephrectomy  . Endometrial cancer November 2015    Treated with TAH with pelvic lymphadenectomy followed by radiation and chemotherapy  . Bacteremia due to Escherichia coli June 2015     Currently being treated with anti-biotics   Past Surgical History  Procedure Laterality Date  . Partial nephrectomy Left 11/18/2008    left partial nephrectomy for renal cell CA  . Anterior cervical decomp/discectomy fusion  10/11/2005    multi-level  . Cardiac catheterization  12/24/10    40% left main, 80% mid LAD, 95% ostial circumflex, 80-90% PDA.  [name] Tonsillectomy      "in college"  . Cholecystectomy  ~ 1971  . Tubal ligation  ~ 1984  . Coronary artery bypass graft  12/25/2010    Procedure: CORONARY ARTERY BYPASS GRAFTING (CABGX3 - LIMA-LAD, SVG RPL, SVG-Circumflex);  Surgeon: Rexene Alberts, MD;  Location: [place];  Service: Open Heart Surgery;  Laterality: N/A;  . Carotid endarterectomy Left   . Colonoscopy    . Upper gi endoscopy    .  Wisdom tooth extraction    . Dilatation & curettage/hysteroscopy with myosure N/A 11/02/2013    Procedure: DILATATION & CURETTAGE/HYSTEROSCOPY WITH MYOSURE ABLATION;  Surgeon: Allena Katz, MD;  Location: [facility];  Service: Gynecology;  Laterality: N/A;  . Left heart catheterization with coronary angiogram N/A 12/24/2010    Procedure: LEFT HEART CATHETERIZATION WITH CORONARY ANGIOGRAM;  Surgeon: Leonie Man, MD;  Location: [facility] CATH LAB;  Service: Cardiovascular;  Laterality: N/A;  . Total abdominal hysterectomy  November 2015     At [facility]: Robotic procedure with pelvic lymphadenectomy  . Transthoracic echocardiogram   06/13/2014    At [facility]: EF 60-65%. Grade 1 diastolic dysfunction. Mild MR. Aortic sclerosis. Moderate pulmonary hypertension  . Pet myocardial perfusion scan  06/13/2014    At [place]: Moderate size, mild severity completely reversible defect involving the basal anterior, mid anterior and apical anterior segments consistent with ischemia. EF 65% with normal global function.  . Cardiac catheterization N/A 07/01/2014    Procedure: Left Heart Cath and Cors/Grafts Angiography;  Surgeon: Leonie Man, MD;  Location: [place] [facility];  Service: Cardiovascular;  Laterality: N/A;  . Doppler echocardiography  12/08/2010    EF =>55%,MILD CONCENTRIC LEFT VENTRICULAR HYPERTROPHY  . Nm myocar single w/spect  07/26/2007    EF 79%, LEFT VENT.FUNCTION NORMAL   Family History  Problem Relation Age of Onset  . Coronary artery disease Father   . Coronary artery disease Sister   . Coronary artery disease Brother   . Heart attack Neg Hx   . Stroke Maternal Grandmother    History  Substance Use Topics  . Smoking status: Never Smoker   . Smokeless tobacco: Never Used  . Alcohol Use: 0.0 oz/week    0 Standard drinks or equivalent per week     Comment: "mixed drink once a year"   OB History    No data available     Review of Systems  Constitutional:       Abnormal labs  All other systems reviewed and are negative.     Allergies  Codeine; Erythromycin; Lopid; Pravastatin; and Simvastatin  Home Medications   Prior to Admission medications   Medication Sig Start Date End Date Taking? Authorizing Provider  Alpha-Lipoic Acid 300 MG TABS Take 600 mg by mouth daily.    Historical Provider, MD  aspirin 81 MG tablet Take 81 mg by mouth daily.      Historical Provider, MD  atorvastatin (LIPITOR) 40 MG tablet Take 40 mg by mouth at bedtime.     Historical Provider, MD  docusate sodium (COLACE) 100 MG capsule Take 100 mg by mouth daily as needed for mild constipation.    Historical  Provider, MD  furosemide (LASIX) 20 MG tablet Take 20 mg by mouth daily.    Historical Provider, MD  gabapentin (NEURONTIN) 100 MG capsule Take 100 mg by mouth 3 (three) times daily.    Historical Provider, MD  glipiZIDE (GLUCOTROL) 5 MG tablet Take 2.5 mg by mouth 2 (two) times daily before a meal.     Historical Provider, MD  isosorbide mononitrate (IMDUR) 30 MG 24 hr tablet Take 1 tablet (30 mg total) by mouth daily. 07/02/14   Rhonda G Barrett, PA-C  loperamide (IMODIUM) 2 MG capsule Take 2 mg by mouth 4 (four) times daily as needed for diarrhea or loose stools.    Historical Provider, MD  loratadine (CLARITIN) 10 MG tablet Take 10 mg by mouth daily.  Historical Provider, MD  magnesium oxide (MAG-OX) 400 MG tablet Take 400 mg by mouth daily.    Historical Provider, MD  metoprolol (LOPRESSOR) 50 MG tablet Take 1 tablet (50 mg total) by mouth 2 (two) times daily. 07/02/14   Rhonda G Barrett, PA-C  nitroGLYCERIN (NITROSTAT) 0.4 MG SL tablet Place 1 tablet (0.4 mg total) under the tongue every 5 (five) minutes as needed for chest pain. 09/03/13   Leonie Man, MD  nystatin (MYCOSTATIN) powder Apply 1 g topically daily as needed.  06/14/14 06/14/15  Historical Provider, MD  omeprazole (PRILOSEC) 40 MG capsule Take 40 mg by mouth daily.      Historical Provider, MD  ondansetron (ZOFRAN) 4 MG tablet Take 4 mg by mouth every 8 (eight) hours as needed for nausea or vomiting.    Historical Provider, MD  Probiotic Product (ALIGN PO) Take 1 each by mouth daily.    Historical Provider, MD  prochlorperazine (COMPAZINE) 10 MG tablet Take 10 mg by mouth every 6 (six) hours as needed for nausea or vomiting.    Historical Provider, MD  promethazine (PHENERGAN) 25 MG tablet Take 25 mg by mouth every 6 (six) hours as needed for nausea or vomiting.    Historical Provider, MD  Vitamin D, Ergocalciferol, (DRISDOL) 50000 UNITS CAPS capsule Take 50,000 Units by mouth every 7 (seven) days.    Historical Provider, MD   BP  125/55 mmHg  Pulse 81  Temp(Src) 98.3 F (36.8 C) (Oral)  Resp 20  SpO2 98%   Physical Exam  Constitutional: She is oriented to person, place, and time. She appears well-developed and well-nourished. No distress.  HENT:  Head: Normocephalic and atraumatic.  Mouth/Throat: Oropharynx is clear and moist.  Eyes: Conjunctivae and EOM are normal. Pupils are equal, round, and reactive to light.  Neck: Normal range of motion. Neck supple.  Cardiovascular: Normal rate, regular rhythm and normal heart sounds.   Pulmonary/Chest: Effort normal and breath sounds normal. No respiratory distress. She has no wheezes.  Port right chest wall  Abdominal: Soft. Bowel sounds are normal. There is no tenderness. There is no guarding.  Musculoskeletal: Normal range of motion. She exhibits no edema.  Neurological: She is alert and oriented to person, place, and time.  Skin: Skin is warm and dry. She is not diaphoretic.  Psychiatric: She has a normal mood and affect.  Nursing note and vitals reviewed.   ED Course  Procedures (including critical care time) Labs Review Labs Reviewed  CBC WITH DIFFERENTIAL/PLATELET - Abnormal; Notable for the following:    RBC 2.54 (*)    Hemoglobin 8.4 (*)    HCT 24.7 (*)    RDW 17.2 (*)    All other components within normal limits  BASIC METABOLIC PANEL - Abnormal; Notable for the following:    Potassium 5.5 (*)    Glucose, Bld 115 (*)    BUN 62 (*)    Creatinine, Ser 3.27 (*)    Calcium 8.8 (*)    GFR calc non Af Amer 13 (*)    GFR calc Af Amer 16 (*)    All other components within normal limits    Imaging Review No results found.   EKG Interpretation None      MDM   Final diagnoses:  AKI (acute kidney injury)  Endometrial cancer  Hyperkalemia   69 year old female here for abnormal labs drawn by her oncologist earlier today. Patient appears well, she has no current complaints. VSS.  She is currently  on Bactrim for resolving infection from her left  femoral central line. No reported fever, chills.  Repeat lab work here does confirm hyperkalemia with acute kidney injury. This is likely multifactorial. EKG without acute t-wave changes.  Patient will be admitted to medicine service for IVF and further management.  Larene Pickett, PA-C 07/29/14 2344  Gareth Morgan, MD 07/30/14 1343

## 2014-07-30 DIAGNOSIS — Z7982 Long term (current) use of aspirin: Secondary | ICD-10-CM | POA: Diagnosis not present

## 2014-07-30 DIAGNOSIS — B9562 Methicillin resistant Staphylococcus aureus infection as the cause of diseases classified elsewhere: Secondary | ICD-10-CM | POA: Diagnosis present

## 2014-07-30 DIAGNOSIS — I739 Peripheral vascular disease, unspecified: Secondary | ICD-10-CM | POA: Diagnosis present

## 2014-07-30 DIAGNOSIS — I25810 Atherosclerosis of coronary artery bypass graft(s) without angina pectoris: Secondary | ICD-10-CM | POA: Diagnosis present

## 2014-07-30 DIAGNOSIS — J45909 Unspecified asthma, uncomplicated: Secondary | ICD-10-CM | POA: Diagnosis present

## 2014-07-30 DIAGNOSIS — I10 Essential (primary) hypertension: Secondary | ICD-10-CM | POA: Diagnosis not present

## 2014-07-30 DIAGNOSIS — Z823 Family history of stroke: Secondary | ICD-10-CM | POA: Diagnosis not present

## 2014-07-30 DIAGNOSIS — I129 Hypertensive chronic kidney disease with stage 1 through stage 4 chronic kidney disease, or unspecified chronic kidney disease: Secondary | ICD-10-CM | POA: Diagnosis present

## 2014-07-30 DIAGNOSIS — E1122 Type 2 diabetes mellitus with diabetic chronic kidney disease: Secondary | ICD-10-CM | POA: Diagnosis present

## 2014-07-30 DIAGNOSIS — N183 Chronic kidney disease, stage 3 (moderate): Secondary | ICD-10-CM | POA: Diagnosis present

## 2014-07-30 DIAGNOSIS — D5700 Hb-SS disease with crisis, unspecified: Secondary | ICD-10-CM | POA: Diagnosis present

## 2014-07-30 DIAGNOSIS — C541 Malignant neoplasm of endometrium: Secondary | ICD-10-CM | POA: Diagnosis present

## 2014-07-30 DIAGNOSIS — Z85528 Personal history of other malignant neoplasm of kidney: Secondary | ICD-10-CM | POA: Diagnosis not present

## 2014-07-30 DIAGNOSIS — N179 Acute kidney failure, unspecified: Secondary | ICD-10-CM | POA: Diagnosis present

## 2014-07-30 DIAGNOSIS — Z8673 Personal history of transient ischemic attack (TIA), and cerebral infarction without residual deficits: Secondary | ICD-10-CM | POA: Diagnosis not present

## 2014-07-30 DIAGNOSIS — N764 Abscess of vulva: Secondary | ICD-10-CM | POA: Diagnosis present

## 2014-07-30 DIAGNOSIS — E785 Hyperlipidemia, unspecified: Secondary | ICD-10-CM | POA: Diagnosis present

## 2014-07-30 DIAGNOSIS — Z885 Allergy status to narcotic agent status: Secondary | ICD-10-CM | POA: Diagnosis not present

## 2014-07-30 DIAGNOSIS — Z888 Allergy status to other drugs, medicaments and biological substances status: Secondary | ICD-10-CM | POA: Diagnosis not present

## 2014-07-30 DIAGNOSIS — R944 Abnormal results of kidney function studies: Secondary | ICD-10-CM | POA: Diagnosis present

## 2014-07-30 DIAGNOSIS — E875 Hyperkalemia: Secondary | ICD-10-CM

## 2014-07-30 DIAGNOSIS — M1990 Unspecified osteoarthritis, unspecified site: Secondary | ICD-10-CM | POA: Diagnosis present

## 2014-07-30 DIAGNOSIS — Z8701 Personal history of pneumonia (recurrent): Secondary | ICD-10-CM | POA: Diagnosis not present

## 2014-07-30 DIAGNOSIS — K219 Gastro-esophageal reflux disease without esophagitis: Secondary | ICD-10-CM | POA: Diagnosis present

## 2014-07-30 DIAGNOSIS — Z8249 Family history of ischemic heart disease and other diseases of the circulatory system: Secondary | ICD-10-CM | POA: Diagnosis not present

## 2014-07-30 DIAGNOSIS — Z79899 Other long term (current) drug therapy: Secondary | ICD-10-CM | POA: Diagnosis not present

## 2014-07-30 DIAGNOSIS — N189 Chronic kidney disease, unspecified: Secondary | ICD-10-CM | POA: Diagnosis not present

## 2014-07-30 DIAGNOSIS — Z881 Allergy status to other antibiotic agents status: Secondary | ICD-10-CM | POA: Diagnosis not present

## 2014-07-30 LAB — BASIC METABOLIC PANEL
ANION GAP: 4 — AB (ref 5–15)
Anion gap: 4 — ABNORMAL LOW (ref 5–15)
Anion gap: 4 — ABNORMAL LOW (ref 5–15)
BUN: 59 mg/dL — AB (ref 6–20)
BUN: 57 mg/dL — ABNORMAL HIGH (ref 6–20)
BUN: 53 mg/dL — ABNORMAL HIGH (ref 6–20)
CALCIUM: 8.8 mg/dL — AB (ref 8.9–10.3)
CHLORIDE: 110 mmol/L (ref 101–111)
CO2: 24 mmol/L (ref 22–32)
CO2: 24 mmol/L (ref 22–32)
CO2: 24 mmol/L (ref 22–32)
CREATININE: 3.18 mg/dL — AB (ref 0.44–1.00)
Calcium: 8.9 mg/dL (ref 8.9–10.3)
Calcium: 8.7 mg/dL — ABNORMAL LOW (ref 8.9–10.3)
Chloride: 111 mmol/L (ref 101–111)
Chloride: 111 mmol/L (ref 101–111)
Creatinine, Ser: 2.84 mg/dL — ABNORMAL HIGH (ref 0.44–1.00)
Creatinine, Ser: 2.65 mg/dL — ABNORMAL HIGH (ref 0.44–1.00)
GFR calc Af Amer: 18 mL/min — ABNORMAL LOW (ref >60)
GFR calc Af Amer: 20 mL/min — ABNORMAL LOW (ref >60)
GFR calc non Af Amer: 16 mL/min — ABNORMAL LOW (ref >60)
GFR calc non Af Amer: 17 mL/min — ABNORMAL LOW (ref >60)
GFR, EST AFRICAN AMERICAN: 16 mL/min — AB (ref >60)
GFR, EST NON AFRICAN AMERICAN: 14 mL/min — AB (ref >60)
GLUCOSE: 123 mg/dL — AB (ref 65–99)
Glucose, Bld: 105 mg/dL — ABNORMAL HIGH (ref 65–99)
Glucose, Bld: 71 mg/dL (ref 65–99)
POTASSIUM: 5.8 mmol/L — AB (ref 3.5–5.1)
Potassium: 5.3 mmol/L — ABNORMAL HIGH (ref 3.5–5.1)
Potassium: 5.2 mmol/L — ABNORMAL HIGH (ref 3.5–5.1)
SODIUM: 138 mmol/L (ref 135–145)
SODIUM: 139 mmol/L (ref 135–145)
Sodium: 139 mmol/L (ref 135–145)

## 2014-07-30 LAB — CBC
HCT: 24.4 % — ABNORMAL LOW (ref 36.0–46.0)
Hemoglobin: 8.2 g/dL — ABNORMAL LOW (ref 12.0–15.0)
MCH: 32.8 pg (ref 26.0–34.0)
MCHC: 33.6 g/dL (ref 30.0–36.0)
MCV: 97.6 fL (ref 78.0–100.0)
Platelets: 138 10*3/uL — ABNORMAL LOW (ref 150–400)
RBC: 2.5 MIL/uL — ABNORMAL LOW (ref 3.87–5.11)
RDW: 16.9 % — AB (ref 11.5–15.5)
WBC: 5.1 10*3/uL (ref 4.0–10.5)

## 2014-07-30 LAB — GLUCOSE, CAPILLARY
GLUCOSE-CAPILLARY: 92 mg/dL (ref 65–99)
GLUCOSE-CAPILLARY: 108 mg/dL — AB (ref 65–99)
GLUCOSE-CAPILLARY: 79 mg/dL (ref 65–99)
GLUCOSE-CAPILLARY: 113 mg/dL — AB (ref 65–99)
Glucose-Capillary: 66 mg/dL (ref 65–99)
Glucose-Capillary: 66 mg/dL (ref 65–99)

## 2014-07-30 NOTE — Progress Notes (Signed)
[facility] Progress Note   Kadijatu Frizzell [id number] DOB: 22-Apr-1945 DOA: 07/29/2014 PCP: Stephens Shire, MD  Brief narrative: Dana Jackson is a 69 y.o. female admitted for hyperkalemia and elevated Cr from oncolgist's office.    Subjective: No compliant of pain, nausea vomiting, diarrhea.   Assessment/Plan: Principal Problem:   Acute-on-chronic kidney injury - she states her baseline Cr is about 2.1- its 2.8 now- recheck in AM - Lasix on hold- hydrating - Bactrim changed to Doxycycline- she is tolerating it OK  Active Problems: Hyperkalemia - K was 5 one month ago- now still 5.2 - repeat in AM  Vulvar abscess - cont Doxy to complete 10 day course- today is day 8- abscess area looks clean    Endometrial cancer - oncologist at [place] hypertension - cont Metoprolol, Imdur    Type 2 diabetes mellitus with renal manifestations, controlled -cont Glucotrol / ISS    CAD (coronary artery disease) of artery bypass graft  Code Status: full code Family Communication: husband and son in law at bedside Disposition Plan: home hopefully tomorrow DVT prophylaxis: heparin Consultants: Procedures:  Antibiotics: Anti-infectives    Start     Dose/Rate Route Frequency Ordered Stop   07/29/14 2300  doxycycline (VIBRA-TABS) tablet 100 mg     100 mg Oral Every 12 hours 07/29/14 2244        Objective: Filed Weights   07/29/14 2244  Weight: 69.5 kg (153 lb 3.5 oz)    Intake/Output Summary (Last 24 hours) at 07/30/14 1718 Last data filed at 07/30/14 1500  Gross per 24 hour  Intake   1600 ml  Output      0 ml  Net   1600 ml     Vitals Filed Vitals:   07/29/14 2200 07/29/14 2244 07/30/14 0441 07/30/14 1440  BP: 146/95 139/62 124/60 119/60  Pulse: 75 77 70 63  Temp:  98 F (36.7 C) 97.5 F (36.4 C) 97.4 F (36.3 C)  TempSrc:   Oral Oral  Resp: 20 18 18 18   Height:  5' (1.524 m)    Weight:  69.5 kg (153 lb 3.5 oz)    SpO2: 98% 97% 100% 100%     Exam:  General:  Pt is alert, not in acute distress  HEENT: No icterus, No thrush, oral mucosa moist  Cardiovascular: regular rate and rhythm, S1/S2 No murmur  Respiratory: clear to auscultation bilaterally   Abdomen: Soft, +Bowel sounds, non tender, non distended, no guarding  MSK: No LE edema, cyanosis or clubbing  Genitalia: incision on right labia clean, no induration  Data Reviewed: Basic Metabolic Panel:  Recent Labs Lab 07/29/14 1955 07/30/14 0242 07/30/14 1135  NA 136 138 139  K 5.5* 5.3* 5.2*  CL 108 110 111  CO2 22 24 24   GLUCOSE 115* 105* 123*  BUN 62* 59* 57*  CREATININE 3.27* 3.18* 2.84*  CALCIUM 8.8* 8.8* 8.9   Liver Function Tests: No results for input(s): AST, ALT, ALKPHOS, BILITOT, PROT, ALBUMIN in the last 168 hours. No results for input(s): LIPASE, AMYLASE in the last 168 hours. No results for input(s): AMMONIA in the last 168 hours. CBC:  Recent Labs Lab 07/29/14 1955 07/30/14 0242  WBC 5.3 5.1  NEUTROABS 2.6  --   HGB 8.4* 8.2*  HCT 24.7* 24.4*  MCV 97.2 97.6  PLT 166 138*   Cardiac Enzymes: No results for input(s): CKTOTAL, CKMB, CKMBINDEX, TROPONINI in the last 168 hours. BNP (last 3 results) No results for  input(s): BNP in the last 8760 hours.  ProBNP (last 3 results) No results for input(s): PROBNP in the last 8760 hours.  CBG:  Recent Labs Lab 07/29/14 2239 07/30/14 0736 07/30/14 1159  GLUCAP 95 92 108*    No results found for this or any previous visit (from the past 240 hour(s)).   Studies: No results found.  Scheduled Meds:  Scheduled Meds: . aspirin  81 mg Oral Daily  . atorvastatin  40 mg Oral QHS  . bifidobacterium infantis  1 capsule Oral Daily  . doxycycline  100 mg Oral Q12H  . gabapentin  100 mg Oral TID  . glipiZIDE  2.5 mg Oral QAC breakfast  . glipiZIDE  2.5 mg Oral QAC supper  . heparin  5,000 Units Subcutaneous 3 times per day  . insulin aspart  0-15 Units Subcutaneous TID WC  . insulin  aspart  0-5 Units Subcutaneous QHS  . isosorbide mononitrate  30 mg Oral Daily  . loratadine  10 mg Oral Daily  . magnesium oxide  400 mg Oral Daily  . metoprolol  50 mg Oral BID  . pantoprazole  40 mg Oral Daily   Continuous Infusions: . sodium chloride 125 mL/hr at 07/30/14 1326    Time spent on care of this patient: 30 min   [place], MD 07/30/2014, 5:18 PM  LOS: 0 days   [facility] Office  [phone] Pager - Text Page per [email or web address] If 7PM-7AM, please contact night-coverage [email or web address]

## 2014-07-30 NOTE — Progress Notes (Signed)
Hypoglycemic Event  CBG: 66  Treatment: Dinner tray  Symptoms: None  Follow-up CBG: Time: 1830 CBG Result: 79  Possible Reasons for Event: Unknown  Comments/MD notified: pt asymptomatic    Lyda Jester  Remember to initiate Hypoglycemia Order Set & complete

## 2014-07-31 DIAGNOSIS — N189 Chronic kidney disease, unspecified: Secondary | ICD-10-CM

## 2014-07-31 DIAGNOSIS — N179 Acute kidney failure, unspecified: Principal | ICD-10-CM

## 2014-07-31 LAB — GLUCOSE, CAPILLARY
GLUCOSE-CAPILLARY: 54 mg/dL — AB (ref 65–99)
GLUCOSE-CAPILLARY: 106 mg/dL — AB (ref 65–99)
Glucose-Capillary: 85 mg/dL (ref 65–99)
Glucose-Capillary: 89 mg/dL (ref 65–99)
Glucose-Capillary: 120 mg/dL — ABNORMAL HIGH (ref 65–99)

## 2014-07-31 LAB — BASIC METABOLIC PANEL
Anion gap: 4 — ABNORMAL LOW (ref 5–15)
BUN: 50 mg/dL — AB (ref 6–20)
CALCIUM: 8.9 mg/dL (ref 8.9–10.3)
CO2: 22 mmol/L (ref 22–32)
Chloride: 114 mmol/L — ABNORMAL HIGH (ref 101–111)
Creatinine, Ser: 2.43 mg/dL — ABNORMAL HIGH (ref 0.44–1.00)
GFR, EST AFRICAN AMERICAN: 22 mL/min — AB (ref >60)
GFR, EST NON AFRICAN AMERICAN: 19 mL/min — AB (ref >60)
GLUCOSE: 91 mg/dL (ref 65–99)
Potassium: 6.4 mmol/L (ref 3.5–5.1)
SODIUM: 140 mmol/L (ref 135–145)

## 2014-07-31 LAB — POTASSIUM: POTASSIUM: 6.2 mmol/L — AB (ref 3.5–5.1)

## 2014-07-31 MED ORDER — DEXTROSE 50 % IV SOLN
50.0000 mL | Freq: Once | INTRAVENOUS | Status: AC
Start: 2014-07-31 — End: 2014-07-31
  Administered 2014-07-31: 50 mL via INTRAVENOUS
  Filled 2014-07-31 (×2): qty 50

## 2014-07-31 MED ORDER — DEXTROSE 50 % IV SOLN
1.0000 | Freq: Once | INTRAVENOUS | Status: DC
  Filled 2014-07-31: qty 50

## 2014-07-31 MED ORDER — SODIUM CHLORIDE 0.9 % IV SOLN
1.0000 g | Freq: Once | INTRAVENOUS | Status: AC
  Administered 2014-07-31: 1 g via INTRAVENOUS
  Filled 2014-07-31: qty 10

## 2014-07-31 MED ORDER — INSULIN ASPART 100 UNIT/ML IV SOLN
10.0000 [IU] | Freq: Once | INTRAVENOUS | Status: AC
  Administered 2014-07-31: 10 [IU] via INTRAVENOUS

## 2014-07-31 MED ORDER — SODIUM POLYSTYRENE SULFONATE 15 GM/60ML PO SUSP
30.0000 g | Freq: Two times a day (BID) | ORAL | Status: DC
  Administered 2014-07-31 (×2): 30 g via ORAL
  Filled 2014-07-31 (×3): qty 120

## 2014-07-31 NOTE — Progress Notes (Signed)
CRITICAL VALUE ALERT  Critical value received:  K+ 6.2  Date of notification:  07/31/2014  Time of notification:  [id number]  Critical value read back:Yes.    Nurse who received alert:  K. Hardin Negus RN   MD notified (1st page):  Samtani  Time of first page:  1430  MD notified (2nd page):  Time of second page:  Responding MD: Verlon Au  Time MD responded:  [phone]

## 2014-07-31 NOTE — Care Management Note (Signed)
Case Management Note  Patient Details  Name: Dana Jackson MRN: [id number] Date of Birth: 08-Dec-1945  Subjective/Objective:   69 y/o f admitted w/AKI. From home.                 Action/Plan:d/c plan home.No anticipated d/c needs.   Expected Discharge Date:   (unknown)               Expected Discharge Plan:  Home/Self Care  In-House Referral:     Discharge planning Services  CM Consult  Post Acute Care Choice:    Choice offered to:     DME Arranged:    DME Agency:     HH Arranged:    HH Agency:     Status of Service:  In process, will continue to follow  Medicare Important Message Given:    Date Medicare IM Given:    Medicare IM give by:    Date Additional Medicare IM Given:    Additional Medicare Important Message give by:     If discussed at [place] of Stay Meetings, dates discussed:    Additional Comments:  Dessa Phi, RN 07/31/2014, 4:19 PM

## 2014-07-31 NOTE — Progress Notes (Signed)
[facility] Progress Note   Dana Jackson [id number] DOB: 1945-09-19 DOA: 07/29/2014 PCP: Stephens Shire, MD  Brief narrative: 69 y/o ? CABG x 3 2012 is, Partial  2010CKD- Stage 3 (followed by Dr. Kristen Cardinal of Nephrology -- s/p partial L nephrectomy for RCCA, 11/2008, baseline Cr of ~1.7), TIA x 2 and claudication  Rx Endometrial Ca 11/2013 and under   chemotherapy radiation [facility] at [facility] Dr. Gaetano Net admitted for hyperkalemia and elevated Cr from oncolgist's office.   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 - Place Sulpha as allergy - she states her baseline Cr is about 2.1- on admit 2.6-->2.4 now - Lasix on hold- hydrating - Bactrim changed to Doxycycline- she is tolerating it OK  Active Problems: Hyperkalemia - K was 5 one month ago- now still 5.2 -Kayexalate started 7/20 -needs OPrenal appt as has only 1 kidney   Vulvar abscess - cont Doxy to complete 10 day course- today is day 8- abscess area looks clean    Endometrial cancer - oncologist at [place] hypertension - cont Metoprolol, Imdur    Type 2 diabetes mellitus with renal manifestations, controlled -cont Glucotrol / ISS    CAD (coronary artery disease) of artery bypass graft  Code Status: full code Family Communication: husband and son in law at bedside Disposition Plan: home hopefully tomorrow DVT prophylaxis: heparin Consultants: Procedures:  Antibiotics: Anti-infectives    Start     Dose/Rate Route Frequency Ordered Stop   07/29/14 2300  doxycycline (VIBRA-TABS) tablet 100 mg     100 mg Oral Every 12 hours 07/29/14 2244         Objective: Filed Weights   07/29/14 2244  Weight: 69.5 kg (153 lb 3.5 oz)    Intake/Output Summary (Last 24 hours) at 07/31/14 1120 Last data filed at 07/31/14 0600  Gross per 24 hour  Intake   3225 ml  Output      0 ml  Net   3225 ml     Vitals Filed Vitals:   07/30/14 1440 07/30/14 2031 07/30/14 2254 07/31/14 0422  BP: 119/60 130/56 138/66 142/56  Pulse: 63 72 73 69  Temp: 97.4 F (36.3 C) 97.9 F (36.6 C)  97.5 F (36.4 C)  TempSrc: Oral Oral  Oral  Resp: 18 20  16   Height:      Weight:      SpO2: 100% 100%  99%    Exam:  General:  Pt is alert, not in acute distress  HEENT: No icterus, No thrush, oral mucosa moist  Cardiovascular: regular rate and rhythm, S1/S2 No murmur  Respiratory: clear to auscultation bilaterally   Abdomen: Soft, +Bowel sounds, non tender, non distended, no guarding   Data Reviewed: Basic Metabolic Panel:  Recent Labs Lab 07/29/14 1955 07/30/14 0242 07/30/14 1135 07/30/14 1748 07/31/14 0405  NA 136 138 139 139 140  K 5.5* 5.3* 5.2* 5.8* 6.4*  CL 108 110 111 111 114*  CO2 22 24 24 24 22   GLUCOSE 115* 105* 123* 71 91  BUN 62* 59* 57* 53* 50*  CREATININE 3.27* 3.18* 2.84* 2.65* 2.43*  CALCIUM 8.8* 8.8* 8.9 8.7* 8.9  Liver Function Tests: No results for input(s): AST, ALT, ALKPHOS, BILITOT, PROT, ALBUMIN in the last 168 hours. No results for input(s): LIPASE, AMYLASE in the last 168 hours. No results for input(s): AMMONIA in the last 168 hours. CBC:  Recent Labs Lab 07/29/14 1955 07/30/14 0242  WBC 5.3 5.1  NEUTROABS 2.6  --   HGB 8.4* 8.2*  HCT 24.7* 24.4*  MCV 97.2 97.6  PLT 166 138*   Cardiac Enzymes: No results for input(s): CKTOTAL, CKMB, CKMBINDEX, TROPONINI in the last 168 hours. BNP (last 3 results) No results for input(s): BNP in the last 8760 hours.  ProBNP (last 3 results) No results for input(s): PROBNP in the last 8760 hours.  CBG:  Recent Labs Lab 07/30/14 1747 07/30/14 1853  07/30/14 2242 07/31/14 0752 07/31/14 0829  GLUCAP 66 79 113* 54* 85    No results found for this or any previous visit (from the past 240 hour(s)).   Studies: No results found.  Scheduled Meds:  Scheduled Meds: . aspirin  81 mg Oral Daily  . atorvastatin  40 mg Oral QHS  . bifidobacterium infantis  1 capsule Oral Daily  . doxycycline  100 mg Oral Q12H  . gabapentin  100 mg Oral TID  . glipiZIDE  2.5 mg Oral QAC breakfast  . glipiZIDE  2.5 mg Oral QAC supper  . heparin  5,000 Units Subcutaneous 3 times per day  . insulin aspart  0-15 Units Subcutaneous TID WC  . insulin aspart  0-5 Units Subcutaneous QHS  . isosorbide mononitrate  30 mg Oral Daily  . loratadine  10 mg Oral Daily  . magnesium oxide  400 mg Oral Daily  . metoprolol  50 mg Oral BID  . pantoprazole  40 mg Oral Daily   Continuous Infusions: . sodium chloride 125 mL/hr at 07/31/14 0505    Time spent on care of this patient: 30 min  Verneita Griffes, MD [facility] [phone]

## 2014-07-31 NOTE — Progress Notes (Signed)
Hypoglycemic Event  CBG: 55  Treatment: 240cc orange juice  Symptoms: None  Follow-up CBG: Time: 0830 CBG Result: 85  Possible Reasons for Event: Unknown  Comments/MD notified: will notify on rounding    Lyda Jester  Remember to initiate Hypoglycemia Order Set & complete

## 2014-07-31 NOTE — Progress Notes (Signed)
CRITICAL VALUE ALERT  Critical value received: Potassium 6.4  Date of notification: 07/31/14  Time of notification:  0457  Critical value read back:yes  Nurse who received alert:  Dellie Catholic  MD notified (1st page):  Yes  Time of first page:  0500

## 2014-08-01 LAB — RENAL FUNCTION PANEL
ALBUMIN: 3 g/dL — AB (ref 3.5–5.0)
Anion gap: 5 (ref 5–15)
BUN: 40 mg/dL — ABNORMAL HIGH (ref 6–20)
CO2: 22 mmol/L (ref 22–32)
Calcium: 9 mg/dL (ref 8.9–10.3)
Chloride: 113 mmol/L — ABNORMAL HIGH (ref 101–111)
Creatinine, Ser: 2.07 mg/dL — ABNORMAL HIGH (ref 0.44–1.00)
GFR calc Af Amer: 27 mL/min — ABNORMAL LOW (ref >60)
GFR, EST NON AFRICAN AMERICAN: 23 mL/min — AB (ref >60)
GLUCOSE: 70 mg/dL (ref 65–99)
Phosphorus: 4.4 mg/dL (ref 2.5–4.6)
Potassium: 4.4 mmol/L (ref 3.5–5.1)
SODIUM: 140 mmol/L (ref 135–145)

## 2014-08-01 LAB — GLUCOSE, CAPILLARY: Glucose-Capillary: 82 mg/dL (ref 65–99)

## 2014-08-01 MED ORDER — SODIUM POLYSTYRENE SULFONATE 15 GM/60ML PO SUSP: 15.0000 g | Freq: Every day | ORAL | Status: DC

## 2014-08-01 NOTE — Discharge Summary (Signed)
Physician Discharge Summary  Dana Jackson [id number] DOB: 03-17-1945 DOA: 07/29/2014  PCP: Stephens Shire, MD  Admit date: 07/29/2014 Discharge date: 08/01/2014  Time spent: 35 minutes  Recommendations for Outpatient Follow-up:  1. Recommend Kayexalate 2 doses 7/22 and 7/23 and then follow-up with PCP for basic metabolic panel as well as CBC on Monday 7/25 2. Recommend holding Lasix until blood work done 3. She will need outpatient follow-up with nephrology and I have given her the name of one of the nephrologists in town who she can see a physician working.   Discharge Diagnoses:  Principal Problem:   Acute-on-chronic kidney injury Active Problems:   Renal cell carcinoma   Essential hypertension   Hyperlipidemia LDL goal <70   Type 2 diabetes mellitus with renal manifestations, controlled   Endometrial cancer   Hyperkalemia   CAD (coronary artery disease) of artery bypass graft   Sickle cell anemia with crisis   Discharge Condition: Stable   Diet recommendation:heart healthy low-salt   Filed Weights   07/29/14 2244  Weight: 69.5 kg (153 lb 3.5 oz)    History of present illness:   69 y/o ? CABG x 3 2012 , Partialnephrectomy 201 as well as CKD- Stage 3 (followed by Dr. Kristen Cardinal of Nephrology -- s/p partial L nephrectomy for RCCA, 11/2008, baseline Cr of ~1.7), TIA x 2 and claudication Rx Endometrial Ca 11/2013 and under chemotherapy radiation Rx at [facility] Dr. Gaetano Net admitted for hyperkalemia and elevated Cr from oncolgist's office.  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 - Place Sulpha as allergy - she states her baseline Cr is about 2.1- on admit 2.6--> trended downwards to 2.0 on day of discharge - Lasix on hold temporarily- hydrated initially with 125 cc an hour and cut back to 50 cc an hour and then discontinued on day of discharge - Bactrim  changed to Doxycycline- and she completed a total course of 8 days therapy  Active Problems: Hyperkalemia - K was 5 one month ago- peaked to a potassium of 6.4 and then trended down after Kayexalate -Kayexalate started 7/20 -Given Kayexalate 7/20 twice a day and then given a prescription for 15 mg on 7/22 and 7/23 with labs to follow-up as an outpatient -needs OP renal appt as has only 1 kidney-this we will attempt to arrange  Vulvar abscess - cont Doxy to complete 10 day course- today is day 8- abscess area looks clean   Endometrial cancer - oncologist at [place] hypertension - cont Metoprolol, Imdur   Type 2 diabetes mellitus with renal manifestations, controlled -cont Glucotrol / ISS   CAD (coronary artery disease) of artery bypass graft    Discharge Exam: Filed Vitals:   08/01/14 0605  BP: 157/80  Pulse: 74  Temp: 98.9 F (37.2 C)  Resp: 20    General: Alert pleasant oriented multiple stools Cardiovascular: S1-S2 no murmur rub or gallop Respiratory: Clinically clear  Discharge Instructions   Discharge Instructions    Diet - low sodium heart healthy    Complete by:  As directed      Discharge instructions    Complete by:  As directed   Do not take Lasix for another 3 or 4 days but instead find out from her primary care physician by labs if you should restart this Please get lab work done at your primary care physician's office within 5-6 days I do  not think you need anymore doxycycline for now I would recommend that you establish care with a nephrologist given that you have one kidney and we will arrange/give you the name of a nephrologist to follow up with or their practice. I would recommend you called them and set up an appointment within 1-2 weeks and this can also be coordinated by her primary care physician     Increase activity slowly    Complete by:  As directed           Current Discharge Medication List    START taking these  medications   Details  sodium polystyrene (KAYEXALATE) 15 GM/60ML suspension Take 60 mLs (15 g total) by mouth daily. Qty: 120 mL, Refills: 0      CONTINUE these medications which have NOT CHANGED   Details  Alpha-Lipoic Acid 600 MG CAPS Take 600 mg by mouth daily.    aspirin 81 MG tablet Take 81 mg by mouth daily.      atorvastatin (LIPITOR) 40 MG tablet Take 40 mg by mouth at bedtime.     docusate sodium (COLACE) 100 MG capsule Take 100 mg by mouth daily as needed for mild constipation.    gabapentin (NEURONTIN) 100 MG capsule Take 100 mg by mouth 3 (three) times daily.    glipiZIDE (GLUCOTROL) 5 MG tablet Take 2.5 mg by mouth 2 (two) times daily as needed (only take in the morning if BS is above 75 and always take evening dose).     isosorbide mononitrate (IMDUR) 30 MG 24 hr tablet Take 1 tablet (30 mg total) by mouth daily. Qty: 30 tablet, Refills: 11    loperamide (IMODIUM) 2 MG capsule Take 2 mg by mouth 4 (four) times daily as needed for diarrhea or loose stools.    loratadine (CLARITIN) 10 MG tablet Take 10 mg by mouth daily.    magnesium oxide (MAG-OX) 400 MG tablet Take 400 mg by mouth daily.    metoprolol (LOPRESSOR) 50 MG tablet Take 1 tablet (50 mg total) by mouth 2 (two) times daily. Qty: 180 tablet, Refills: 3    nitroGLYCERIN (NITROSTAT) 0.4 MG SL tablet Place 1 tablet (0.4 mg total) under the tongue every 5 (five) minutes as needed for chest pain. Qty: 25 tablet, Refills: 3   Associated Diagnoses: CAD in native artery; S/P CABG x 3    nystatin (MYCOSTATIN) powder Apply 1 g topically daily as needed (breakout yeast infection).     omeprazole (PRILOSEC) 40 MG capsule Take 40 mg by mouth daily.      ondansetron (ZOFRAN) 4 MG tablet Take 4 mg by mouth every 8 (eight) hours as needed for nausea or vomiting.    Probiotic Product (ALIGN PO) Take 1 each by mouth daily.    prochlorperazine (COMPAZINE) 10 MG tablet Take 10 mg by mouth every 6 (six) hours as needed  for nausea or vomiting.    promethazine (PHENERGAN) 25 MG tablet Take 25 mg by mouth every 6 (six) hours as needed for nausea or vomiting.    Pyridoxine HCl (VITAMIN B-6 PO) Take 2 tablets by mouth daily.    Vitamin D, Ergocalciferol, (DRISDOL) 50000 UNITS CAPS capsule Take 50,000 Units by mouth every 7 (seven) days. Wednesday      STOP taking these medications     furosemide (LASIX) 20 MG tablet      sulfamethoxazole-trimethoprim (BACTRIM,SEPTRA) 400-80 MG per tablet        Allergies  Allergen Reactions  . Codeine Nausea Only  .  Erythromycin Itching  . Lopid [Gemfibrozil] Itching  . Pravastatin Other (See Comments)    Used 01/2011 to 03/2011  . Simvastatin Other (See Comments)    Used 07/2007 to 11/2010 chg to pravastatin  . Sulfa Antibiotics     aki       The results of significant diagnostics from this hospitalization (including imaging, microbiology, ancillary and laboratory) are listed below for reference.    Significant Diagnostic Studies: No results found.  Microbiology: No results found for this or any previous visit (from the past 240 hour(s)).   Labs: Basic Metabolic Panel:  Recent Labs Lab 07/30/14 0242 07/30/14 1135 07/30/14 1748 07/31/14 0405 07/31/14 1310 08/01/14 0430  NA 138 139 139 140  --  140  K 5.3* 5.2* 5.8* 6.4* 6.2* 4.4  CL 110 111 111 114*  --  113*  CO2 24 24 24 22   --  22  GLUCOSE 105* 123* 71 91  --  70  BUN 59* 57* 53* 50*  --  40*  CREATININE 3.18* 2.84* 2.65* 2.43*  --  2.07*  CALCIUM 8.8* 8.9 8.7* 8.9  --  9.0  PHOS  --   --   --   --   --  4.4   Liver Function Tests:  Recent Labs Lab 08/01/14 0430  ALBUMIN 3.0*   No results for input(s): LIPASE, AMYLASE in the last 168 hours. No results for input(s): AMMONIA in the last 168 hours. CBC:  Recent Labs Lab 07/29/14 1955 07/30/14 0242  WBC 5.3 5.1  NEUTROABS 2.6  --   HGB 8.4* 8.2*  HCT 24.7* 24.4*  MCV 97.2 97.6  PLT 166 138*   Cardiac Enzymes: No results  for input(s): CKTOTAL, CKMB, CKMBINDEX, TROPONINI in the last 168 hours. BNP: BNP (last 3 results) No results for input(s): BNP in the last 8760 hours.  ProBNP (last 3 results) No results for input(s): PROBNP in the last 8760 hours.  CBG:  Recent Labs Lab 07/31/14 0752 07/31/14 0829 07/31/14 1154 07/31/14 1734 07/31/14 2133  GLUCAP 54* 85 89 120* 106*       Signed:  Nita Sells  [facility] 08/01/2014, 8:12 AM

## 2014-08-01 NOTE — Care Management Important Message (Signed)
Important Message  Patient Details  Name: Anetha Korst MRN: [id number] Date of Birth: [birth year]   Medicare Important Message Given:  [facility] notification given    Camillo Flaming 08/01/2014, 11:38 AMImportant Message  Patient Details  Name: Laruen Wahlstrom MRN: [id number] Date of Birth: 1945-11-13   Medicare Important Message Given:  Yes-second notification given    Camillo Flaming 08/01/2014, 11:38 AM

## 2014-08-01 NOTE — Care Management Note (Signed)
Case Management Note  Patient Details  Name: Dana Jackson MRN: [id number] Date of Birth: 11/09/1945  Subjective/Objective:                    Action/Plan:d/c home no needs or orders.   Expected Discharge Date:   (unknown)               Expected Discharge Plan:  Home/Self Care  In-House Referral:     Discharge planning Services  CM Consult  Post Acute Care Choice:    Choice offered to:     DME Arranged:    DME Agency:     HH Arranged:    [place] Agency:     Status of Service:  Completed, signed off  Medicare Important Message Given:    Date Medicare IM Given:    Medicare IM give by:    Date Additional Medicare IM Given:    Additional Medicare Important Message give by:     If discussed at [place] of Stay Meetings, dates discussed:    Additional Comments:  Dessa Phi, RN 08/01/2014, 10:51 AM

## 2014-08-13 ENCOUNTER — Ambulatory Visit (HOSPITAL_COMMUNITY)
Admission: RE | Admit: 2014-08-13 | Discharge: 2014-08-13 | Disposition: A | Discharge status: Home / Self Care | Payer: Medicare Other | Source: Ambulatory Visit | Attending: Urology | Admitting: Urology

## 2014-08-13 DIAGNOSIS — Z905 Acquired absence of kidney: Secondary | ICD-10-CM | POA: Insufficient documentation

## 2014-08-13 DIAGNOSIS — N185 Chronic kidney disease, stage 5: Secondary | ICD-10-CM | POA: Diagnosis not present

## 2014-08-13 DIAGNOSIS — N281 Cyst of kidney, acquired: Secondary | ICD-10-CM | POA: Diagnosis not present

## 2014-08-13 DIAGNOSIS — C649 Malignant neoplasm of unspecified kidney, except renal pelvis: Secondary | ICD-10-CM

## 2014-08-13 DIAGNOSIS — N289 Disorder of kidney and ureter, unspecified: Secondary | ICD-10-CM | POA: Insufficient documentation

## 2014-08-28 ENCOUNTER — Telehealth: Payer: Self-pay | Admitting: Cardiology

## 2014-08-28 NOTE — Telephone Encounter (Signed)
Received records from [facility] for appointment on 09/06/14 with Dr Ellyn Hack.  Records given to [facility] (medical records) for Dr Allison Quarry schedule on 09/06/14. lp

## 2014-09-06 ENCOUNTER — Ambulatory Visit (INDEPENDENT_AMBULATORY_CARE_PROVIDER_SITE_OTHER): Payer: Medicare Other | Admitting: Cardiology

## 2014-09-06 ENCOUNTER — Encounter: Payer: Self-pay | Admitting: Cardiology

## 2014-09-06 VITALS — BP 118/70 | HR 74 | Ht 60.0 in | Wt 149.0 lb

## 2014-09-06 DIAGNOSIS — I25810 Atherosclerosis of coronary artery bypass graft(s) without angina pectoris: Secondary | ICD-10-CM

## 2014-09-06 DIAGNOSIS — R9439 Abnormal result of other cardiovascular function study: Secondary | ICD-10-CM

## 2014-09-06 DIAGNOSIS — I10 Essential (primary) hypertension: Secondary | ICD-10-CM | POA: Diagnosis not present

## 2014-09-06 DIAGNOSIS — I2510 Atherosclerotic heart disease of native coronary artery without angina pectoris: Secondary | ICD-10-CM | POA: Diagnosis not present

## 2014-09-06 DIAGNOSIS — N184 Chronic kidney disease, stage 4 (severe): Secondary | ICD-10-CM

## 2014-09-06 DIAGNOSIS — E669 Obesity, unspecified: Secondary | ICD-10-CM

## 2014-09-06 DIAGNOSIS — E785 Hyperlipidemia, unspecified: Secondary | ICD-10-CM

## 2014-09-06 DIAGNOSIS — R931 Abnormal findings on diagnostic imaging of heart and coronary circulation: Secondary | ICD-10-CM

## 2014-09-06 NOTE — Progress Notes (Signed)
PCP: Stephens Shire, MD Nephrologist: Dr. Joelyn Oms  Clinic Note: Chief Complaint  Patient presents with  . 2 months    Patient has felt light headed and dizzy.  . Coronary Artery Disease    s/p CATH    HPI: Dana Jackson is a 69 y.o. female with a PMH of prior TIA and carotid directly as well as partial nephrectomy from renal cell carcinoma with residual CK D-3B who presents today for followup of her CAD status post CABG. She has been treated at [facility] for endometrial cancer status post TAH/BSO followed by chemotherapy and radiation. Was @ [facility] for Endometrial Ca Rx Followed by urosepsis-- had Nuc ST read as abnormal --> Cardiac Cath Has subsequently developed chronic renal insufficiency with a creatinine (related to ATN) of roughly 2.7-3.0. Back in March the creatinine was 1.7-1.8 range  Studies Reviewed:  CARDIAC CATH 07/01/2014  Conclusion    1. Severe multivessel native CAD with 2 of 3 grafts patent and occluded SVG-rPDA. Patent LIMA-~dLAD, SVG-OM. Occluded SVG-rPDA at the origin 2. Ost LAD lesion, 80% stenosed. Mid LAD lesion, 100% stenosed after 2nd septal perforator. The proximal-mid LAD is at least moderately diseased 3. Ramus lesion, 70% stenosed - small caliber, non-PCI amenable 4. Ost Cx to Prox Cx lesion, 99% stenosed. 1st Mrg lesion, 90% stenosed. 5. Ost RPDA to RPDA lesion, 80% stenosed. Similar to pre-CABG. < 2 mm vessel. 6. Potential culprit for abnormal Nuclear Stress Test is the diffusely diseased proximal-mid LAD with diseased septal perforators from LAD & rPDA  No PCI target available to treat the "anterior ischemia" in an asymptomatic patient.  Recommendations:  Post Femoral cath with sheath removal   Aggressive IV fluid hydration overnight  Overnight - outpatient in bed for post cath hydration given CKD.  Continue current med Rx.  Will need to check CMP at the end of the week.   She was seen by Rosalyn Gess, PA-C. On June 29 for postop  followup.  On 7/18 - 21/2016, was admitted to [facility] for Acute on Chronic Renal Failure - (told by [facility] MD Office that Cr level was increase @ labs checked there -- admitted for hydration).  Thought to be related to Sulfa Abx for UTI/ Vulvar abscess - Abx converted to Doxycycline.  Also had HyperKalemia 6.4 --> Kayexalate.   Interval History: She finally presents for her post-catheterization follow-up.  She has not had any CP or notable dyspnea with rest / exertion & has been relatively stable from a cardiac standpoint, with the exception of mild positional dizziness.  No heart failure symptoms of PND, orthopnea,but does have some intermittent lower extremity edema that is chronic. Usually in her feet. Remainder of her Cardiovascular ROS is as follows:  No palpitations, weakness or syncope/near syncope.  No TIA/amaurosis fugax symptoms.  No melena, hematochezia, hematuria, or epstaxis.  No claudication.  She still has some occasional lightheadedness and dizziness that is positional most. If she overdoes her exercise she will get short of breath.  Past Medical History  Diagnosis Date  . 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  95% ostial circumflex, 80-90% PDA  . 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 History of unstable angina November/December 2012    T wave inversions in inferolateral leads.  No stress test performed.  [name] TIA (transient ischemic attack) 1992 &  2010  . Hypertension,  essential, benign   . Hyperlipidemia LDL goal <70   . CKD (chronic kidney disease) stage 3, GFR 30-59 ml/min   . GERD (gastroesophageal reflux disease)   . Arthritis   . History of asthma      childhood  . History of pneumonia     "2-3 times"  . Degenerative disc disease, cervical     Degenerative disc disease, cervical [722.4]  . Stroke     TIA  . Asthma     childhood  . Pneumonia      walking  . Renal cell carcinoma 11/18/2008    T2aNx s/p partial left nephrectomy  . Endometrial cancer November 2015    Treated with TAH with pelvic lymphadenectomy followed by radiation and chemotherapy  . Bacteremia due to Escherichia coli June 2015     Currently being treated with anti-biotics   Past Surgical History  Procedure Laterality Date  . Partial nephrectomy Left 11/18/2008    left partial nephrectomy for renal cell CA  . Anterior cervical decomp/discectomy fusion  10/11/2005    multi-level  . Cardiac catheterization  12/24/10    40% left main, 80% mid LAD, 95% ostial circumflex, 80-90% PDA.  [name] Tonsillectomy      "in college"  . Cholecystectomy  ~ 1971  . Tubal ligation  ~ 1984  . Coronary artery bypass graft  12/25/2010    Procedure: CORONARY ARTERY BYPASS GRAFTING (CABGX3 - LIMA-LAD, SVG RPL, SVG-Circumflex);  Surgeon: Rexene Alberts, MD;  Location: [place];  Service: Open Heart Surgery;  Laterality: N/A;  . Carotid endarterectomy Left   . Colonoscopy    . Upper gi endoscopy    . Wisdom tooth extraction    . Dilatation & curettage/hysteroscopy with myosure N/A 11/02/2013    Procedure: DILATATION & CURETTAGE/HYSTEROSCOPY WITH MYOSURE ABLATION;  Surgeon: Allena Katz, MD;  Location: [facility];  Service: Gynecology;  Laterality: N/A;  . Left heart catheterization with coronary angiogram N/A 12/24/2010    Procedure: LEFT HEART CATHETERIZATION WITH CORONARY ANGIOGRAM;  Surgeon: Leonie Man, MD;  Location: [facility] CATH LAB;  Service: Cardiovascular;  Laterality: N/A;  . Total abdominal hysterectomy  November 2015     At [facility]: Robotic procedure with pelvic lymphadenectomy  . Transthoracic echocardiogram  06/13/2014    At [place]: EF 60-65%. Grade 1 diastolic dysfunction. Mild MR. Aortic sclerosis. Moderate pulmonary hypertension  . Pet myocardial perfusion scan  06/13/2014    At [place]: Moderate size, mild severity completely reversible defect  involving the basal anterior, mid anterior and apical anterior segments consistent with ischemia. EF 65% with normal global function.  . Cardiac catheterization N/A 07/01/2014    Procedure: Left Heart Cath and Cors/Grafts Angiography;  Surgeon: Leonie Man, MD;  Location: [place] CV LAB;  Service: Cardiovascular;  Laterality: N/A;  . Doppler echocardiography  12/08/2010    EF =>55%,MILD CONCENTRIC LEFT VENTRICULAR HYPERTROPHY  . Nm myocar single w/spect  07/26/2007    EF 79%, LEFT VENT.FUNCTION NORMAL    Recent Cardiac  Evaluation: From [facility] 2nd  Echocardiogram: EF 60-65%. Grade 1 diastolic dysfunction. Mild MR. Aortic sclerosis. Moderate pulmonary hypertension.  PET Myocardial Perfusion Scan:  Moderate size, mild severity completely reversible defect involving the basal anterior, mid anterior and apical anterior segments consistent with ischemia. EF 65% with normal global function.   Prior to Admission medications   Medication Sig Start Date End Date Taking? Authorizing Provider  Alpha-Lipoic Acid 600 MG CAPS Take 600  mg by mouth daily.   Yes Historical Provider, MD  aspirin 81 MG tablet Take 81 mg by mouth daily.     Yes Historical Provider, MD  atorvastatin (LIPITOR) 40 MG tablet Take 40 mg by mouth at bedtime.    Yes Historical Provider, MD  docusate sodium (COLACE) 100 MG capsule Take 100 mg by mouth daily as needed for mild constipation.   Yes Historical Provider, MD  gabapentin (NEURONTIN) 100 MG capsule Take 100 mg by mouth 3 (three) times daily.   Yes Historical Provider, MD  glipiZIDE (GLUCOTROL) 5 MG tablet Take 2.5 mg by mouth 2 (two) times daily as needed (only take in the morning if BS is above 75 and always take evening dose).    Yes Historical Provider, MD  isosorbide mononitrate (IMDUR) 30 MG 24 hr tablet Take 1 tablet (30 mg total) by mouth daily. 07/02/14  Yes Rhonda G Barrett, PA-C  loperamide (IMODIUM) 2 MG capsule Take 2 mg by mouth 4 (four) times daily as  needed for diarrhea or loose stools.   Yes Historical Provider, MD  loratadine (CLARITIN) 10 MG tablet Take 10 mg by mouth daily.   Yes Historical Provider, MD  magnesium oxide (MAG-OX) 400 MG tablet Take 400 mg by mouth daily.   Yes Historical Provider, MD  metoprolol (LOPRESSOR) 50 MG tablet Take 1 tablet (50 mg total) by mouth 2 (two) times daily. 07/02/14  Yes Rhonda G Barrett, PA-C  nitroGLYCERIN (NITROSTAT) 0.4 MG SL tablet Place 1 tablet (0.4 mg total) under the tongue every 5 (five) minutes as needed for chest pain. 09/03/13  Yes Leonie Man, MD  nystatin (MYCOSTATIN) powder Apply 1 g topically daily as needed (breakout yeast infection).  06/14/14 06/14/15 Yes Historical Provider, MD  omeprazole (PRILOSEC) 40 MG capsule Take 40 mg by mouth daily.     Yes Historical Provider, MD  ondansetron (ZOFRAN) 4 MG tablet Take 4 mg by mouth every 8 (eight) hours as needed for nausea or vomiting.   Yes Historical Provider, MD  Probiotic Product (ALIGN PO) Take 1 each by mouth daily.   Yes Historical Provider, MD  prochlorperazine (COMPAZINE) 10 MG tablet Take 10 mg by mouth every 6 (six) hours as needed for nausea or vomiting.   Yes Historical Provider, MD  promethazine (PHENERGAN) 25 MG tablet Take 25 mg by mouth every 6 (six) hours as needed for nausea or vomiting.   Yes Historical Provider, MD  Pyridoxine HCl (VITAMIN B-6 PO) Take 2 tablets by mouth daily.   Yes Historical Provider, MD  Vitamin D, Ergocalciferol, (DRISDOL) 50000 UNITS CAPS capsule Take 50,000 Units by mouth every 7 (seven) days. Wednesday   Yes Historical Provider, MD  levofloxacin (LEVAQUIN) 500 MG tablet Take 500 mg by mouth daily. Take for one week. 09/03/14   Historical Provider, MD   Also takes Furosemide 20 mg PO PRN edema  Allergies  Allergen Reactions  . Codeine Nausea Only  . Erythromycin Itching  . Lopid [Gemfibrozil] Itching  . Pravastatin Other (See Comments)    Used 01/2011 to 03/2011  . Simvastatin Other (See  Comments)    Used 07/2007 to 11/2010 chg to pravastatin  . Sulfa Antibiotics     aki    Social History  Substance Use Topics  . Smoking status: Never Smoker   . Smokeless tobacco: Never Used  . Alcohol Use: 0.0 oz/week    0 Standard drinks or equivalent per week     Comment: "mixed drink once a  year"   Family History  Problem Relation Age of Onset  . Coronary artery disease Father   . Coronary artery disease Sister   . Coronary artery disease Brother   . Heart attack Neg Hx   . Stroke Maternal Grandmother    ROS: A comprehensive Review of Systems - was performed. Review of Systems  Constitutional: Positive for malaise/fatigue (expected with chemotherapy radiation etc. but getting better).  Respiratory: Negative for cough.   Gastrointestinal: Negative for blood in stool and melena.  Genitourinary: Negative for hematuria.  Musculoskeletal: Positive for joint pain. Negative for falls.  Neurological: Positive for dizziness (Occasional positional).  Psychiatric/Behavioral: Negative for depression.  All other systems reviewed and are negative.  Wt Readings from Last 3 Encounters:  09/06/14 149 lb (67.586 kg)  07/29/14 153 lb 3.5 oz (69.5 kg)  07/10/14 150 lb (68.04 kg)   PHYSICAL EXAM BP 118/70 mmHg  Pulse 74  Ht 5' (1.524 m)  Wt 149 lb (67.586 kg)  BMI 29.10 kg/m2 General appearance: alert, cooperative, appears stated age, no distress and Well-nourished and well-groomed. Very talkative. Intelligent gentleman. Answers questions appropriately.  Neck: no adenopathy, no carotid bruit, no JVD and supple, symmetrical, trachea midline  Lungs: CTAB, normal percussion bilaterally and Nonlabored, good air movement  Heart: RRR with ectopy, S1: normal, S2: physiologically split and Otherwise no M/R/G.  Abdomen: soft, non-tender; bowel sounds normal; no masses, no organomegaly  Extremities: extremities normal, atraumatic, no cyanosis or edema, no edema,  Pulses: 2+ and symmetric   Neurologic: Grossly normal   Adult ECG Report  Rate: 73 ;  Rhythm: normal sinus rhythm -   Narrative Interpretation: normal EKG   Recent Labs :    Chemistry      Component Value Date/Time   NA 140 08/01/2014 0430   K 4.4 08/01/2014 0430   CL 113* 08/01/2014 0430   CO2 22 08/01/2014 0430   BUN 40* 08/01/2014 0430   CREATININE 2.07* 08/01/2014 0430      Component Value Date/Time   CALCIUM 9.0 08/01/2014 0430   ALKPHOS 101 06/10/2014 0830   AST 33 06/10/2014 0830   ALT 33 06/10/2014 0830   BILITOT 0.8 06/10/2014 0830       ASSESSMENT / PLAN: Problem List Items Addressed This Visit    Abnormal nuclear stress test: Anterior ischemia, INTERMEDIATE RISK  (Chronic)    As per Noted above. Plan is medical management -- especially in the absence of symptoms.      CAD (coronary artery disease) of artery bypass graft - Primary    She is to 3 grafts patent with an occluded SVG to PDA. Severe native vessel disease with 80% ostial LAD and 100% mid LAD, 70% ramus and a small amount he said minimal vessel. The circumflex with a 90% proximal lesion and 11/11/1988 percent stenosis in his graft. Unfortunately the ostial PDA has an 80% stenosis it is similar to pre-CABG finding in a less than 2 mm vessel and therefore likely not a good candidate. Probably the culprit for her abnormal stress test is related to the proximal portion of the LAD and septals that are compromised by the 80% ostial lesion. She doesn't have any active anginal symptoms. With a low risk stress test finding and no culprit lesion for PCI, medical management is the best choice. Continue aspirin low-dose Imdur and beta blocker. Is also on statin. Not having to use any when necessary nitroglycerin.      Chronic kidney disease (CKD), stage IV (severe) (  Chronic)    Recent admission for acute on chronic renal insufficiency thought to be related to antibiotic choice. She will definitely need pre-hydration for any potential invasive  evaluation with cardiac standpoint. Thankfully her creatinine seems to be back to normal.      Coronary artery disease involving native coronary artery without angina pectoris (Chronic)    Severe native coronary disease. She did not have any signs of inferior ischemia, therefore this was suggested to PDA region is not in jeopardy. Would not be considered a good PCI target anyway. Continue medical management as noted in other CAD segment      Essential hypertension (Chronic)    Well-controlled on current regimen. If she is noticing days of feeling somewhat dizzy, she can cut her beta blocker dose in half.      Hyperlipidemia LDL goal <70 (Chronic)    She is on atorvastatin, but this has been on hold for a while during her treatment course. She is due for labs to be checked soon. This has been followed by her primary physician.      Obesity (BMI 30-39.9) (Chronic)    At this point, with her undergoing cancer treatment, she is now below the obesity category criteria. Hopefully if she starts to get stronger from her cancer treatments, she can get back into full exercise.         No orders of the defined types were placed in this encounter.   Meds ordered this encounter  Medications  . levofloxacin (LEVAQUIN) 500 MG tablet    Sig: Take 500 mg by mouth daily. Take for one week.    Refill:  0     NO CHANGE IN MEDICATIONS  IF YOU FEE DIZZY OR LIGHTHEADED ONE DAY-  YOU MAY TAKE 1/2 OF YOUR METOPROLOL (TOPROL) FOR THAT DAY THEN RETURN TO REGULAR DOSE THE NEXT DAY.   Your physician wants you to follow-up in 6 months with Dr Ellyn Hack- 30 MIN    Geron Mulford, Leonie Green, M.D., M.S. Interventional Cardiologist   Pager # [phone]

## 2014-09-06 NOTE — Patient Instructions (Signed)
NO CHANGE IN MEDICATIONS  IF YOU FEE DIZZY OR LIGHTHEADED ONE DAY-  YOU MAY TAKE 1/2 OF YOUR METOPROLOL (TOPROL) FOR THAT DAY THEN RETURN TO REGULAR DOSE THE NEXT DAY.   Your physician wants you to follow-up in 6 months with Dr Ellyn Hack- 30 MIN You will receive a reminder letter in the mail two months in advance. If you don't receive a letter, please call our office to schedule the follow-up appointment.

## 2014-09-08 ENCOUNTER — Encounter: Payer: Self-pay | Admitting: Cardiology

## 2014-09-08 DIAGNOSIS — I25119 Atherosclerotic heart disease of native coronary artery with unspecified angina pectoris: Secondary | ICD-10-CM | POA: Insufficient documentation

## 2014-09-08 NOTE — Assessment & Plan Note (Signed)
She is on atorvastatin, but this has been on hold for a while during her treatment course. She is due for labs to be checked soon. This has been followed by her primary physician.

## 2014-09-08 NOTE — Assessment & Plan Note (Signed)
Well-controlled on current regimen. If she is noticing days of feeling somewhat dizzy, she can cut her beta blocker dose in half.

## 2014-09-08 NOTE — Assessment & Plan Note (Signed)
Recent admission for acute on chronic renal insufficiency thought to be related to antibiotic choice. She will definitely need pre-hydration for any potential invasive evaluation with cardiac standpoint. Thankfully her creatinine seems to be back to normal.

## 2014-09-08 NOTE — Assessment & Plan Note (Signed)
She is to 3 grafts patent with an occluded SVG to PDA. Severe native vessel disease with 80% ostial LAD and 100% mid LAD, 70% ramus and a small amount he said minimal vessel. The circumflex with a 90% proximal lesion and 11/11/1988 percent stenosis in his graft. Unfortunately the ostial PDA has an 80% stenosis it is similar to pre-CABG finding in a less than 2 mm vessel and therefore likely not a good candidate. Probably the culprit for her abnormal stress test is related to the proximal portion of the LAD and septals that are compromised by the 80% ostial lesion. She doesn't have any active anginal symptoms. With a low risk stress test finding and no culprit lesion for PCI, medical management is the best choice. Continue aspirin low-dose Imdur and beta blocker. Is also on statin. Not having to use any when necessary nitroglycerin.

## 2014-09-08 NOTE — Assessment & Plan Note (Signed)
As per Noted above. Plan is medical management -- especially in the absence of symptoms.

## 2014-09-08 NOTE — Assessment & Plan Note (Signed)
Severe native coronary disease. She did not have any signs of inferior ischemia, therefore this was suggested to PDA region is not in jeopardy. Would not be considered a good PCI target anyway. Continue medical management as noted in other CAD segment

## 2014-09-08 NOTE — Assessment & Plan Note (Signed)
At this point, with her undergoing cancer treatment, she is now below the obesity category criteria. Hopefully if she starts to get stronger from her cancer treatments, she can get back into full exercise.

## 2014-10-31 ENCOUNTER — Other Ambulatory Visit: Payer: Self-pay | Admitting: Cardiology

## 2014-10-31 MED ORDER — ISOSORBIDE MONONITRATE ER 30 MG PO TB24: 30.0000 mg | ORAL_TABLET | Freq: Every day | ORAL | Status: DC

## 2014-11-15 ENCOUNTER — Other Ambulatory Visit: Payer: Self-pay | Admitting: Cardiology

## 2015-03-06 ENCOUNTER — Ambulatory Visit (INDEPENDENT_AMBULATORY_CARE_PROVIDER_SITE_OTHER): Payer: Medicare Other | Admitting: Cardiology

## 2015-03-06 ENCOUNTER — Encounter: Payer: Self-pay | Admitting: Cardiology

## 2015-03-06 VITALS — BP 106/66 | HR 72 | Ht 60.0 in | Wt 155.4 lb

## 2015-03-06 DIAGNOSIS — E785 Hyperlipidemia, unspecified: Secondary | ICD-10-CM

## 2015-03-06 DIAGNOSIS — I2510 Atherosclerotic heart disease of native coronary artery without angina pectoris: Secondary | ICD-10-CM

## 2015-03-06 DIAGNOSIS — I25810 Atherosclerosis of coronary artery bypass graft(s) without angina pectoris: Secondary | ICD-10-CM | POA: Diagnosis not present

## 2015-03-06 DIAGNOSIS — I10 Essential (primary) hypertension: Secondary | ICD-10-CM | POA: Diagnosis not present

## 2015-03-06 DIAGNOSIS — N184 Chronic kidney disease, stage 4 (severe): Secondary | ICD-10-CM | POA: Diagnosis not present

## 2015-03-06 DIAGNOSIS — E669 Obesity, unspecified: Secondary | ICD-10-CM

## 2015-03-06 NOTE — Patient Instructions (Signed)
NO CHANGE TO CURRENT MEDICATIONS  Your physician wants you to follow-up in [place].- 30 MIN  You will receive a reminder letter in the mail two months in advance. If you don't receive a letter, please call our office to schedule the follow-up appointment.  If you need a refill on your cardiac medications before your next appointment, please call your pharmacy.

## 2015-03-06 NOTE — Progress Notes (Signed)
PCP: Stephens Shire, MD Nephrologist: Dr. Joelyn Oms  Clinic Note: Chief Complaint  Patient presents with  . Follow-up    6 month//pt c/o mild SOB on exertion, no other Sx.  . Coronary Artery Disease   HPI: Carressa Kiesow is a 70 y.o. female with a PMH of prior TIA and carotid directly as well as partial nephrectomy from renal cell carcinoma with residual CK D-3B who presents today for followup of her CAD status post CABG. She has been treated at [facility] for endometrial cancer status post TAH/BSO followed by chemotherapy and radiation. Was @ [facility] for Endometrial Ca Rx Followed by urosepsis-- had Nuc ST read as abnormal --> Cardiac Cath Has subsequently developed chronic renal insufficiency with a creatinine (related to ATN) of roughly 2.7-3.0. Back in March the creatinine was 1.7-1.8 range  Studies Reviewed: no new studies  Interval History: She resents today doing relatively well. She has some mild exertional dyspnea if she overdoes it. This is partly because she is now trying to actually start exercising/walking. She is also doing weight watchers currently his weight. Now that she is finally finished her chemotherapy and radiation therapy she feels that she may have enough energy to get back in exercising. She is quite likely somewhat deconditioned. She denies any resting or exertional chest pain/anginal symptoms. No PND, orthopnea or edema.  Remainder of her Cardiovascular ROS is as follows:  No palpitations, weakness or syncope/near syncope.  No TIA/amaurosis fugax symptoms.  No melena, hematochezia, hematuria, or epstaxis.  No claudication.  She still has some occasional lightheadedness and dizziness that is positional most. If she overdoes her exercise she will get short of breath.  Past Medical History  Diagnosis Date  . 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  95% ostial circumflex, 80-90%  PDA  . 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 History of unstable angina November/December 2012    T wave inversions in inferolateral leads.  No stress test performed.  [name] TIA (transient ischemic attack) 1992 &  2010  . Hypertension, essential, benign   . Hyperlipidemia LDL goal <70   . CKD (chronic kidney disease) stage 3, GFR 30-59 ml/min   . GERD (gastroesophageal reflux disease)   . Arthritis   . History of asthma      childhood  . History of pneumonia     "2-3 times"  . Degenerative disc disease, cervical     Degenerative disc disease, cervical [722.4]  . Stroke [facility])     TIA  . Asthma     childhood  . Pneumonia     walking  . Renal cell carcinoma ([place]) 11/18/2008    T2aNx s/p partial left nephrectomy  . Endometrial cancer [facility]) November 2015    Treated with TAH with pelvic lymphadenectomy followed by radiation and chemotherapy  . Bacteremia due to Escherichia coli June 2015     Currently being treated with anti-biotics   Past Surgical History  Procedure Laterality Date  . Partial nephrectomy Left 11/18/2008    left partial nephrectomy for renal cell CA  . Anterior cervical decomp/discectomy fusion  10/11/2005    multi-level  . Cardiac catheterization  12/24/10    40% left main, 80% mid LAD, 95% ostial circumflex, 80-90% PDA.  [name] Tonsillectomy      "in college"  . Cholecystectomy  ~ 1971  . Tubal  ligation  ~ 1984  . Coronary artery bypass graft  12/25/2010    Procedure: CORONARY ARTERY BYPASS GRAFTING (CABGX3 - LIMA-LAD, SVG RPL, SVG-Circumflex);  Surgeon: Rexene Alberts, MD;  Location: [place];  Service: Open Heart Surgery;  Laterality: N/A;  . Carotid endarterectomy Left   . Colonoscopy    . Upper gi endoscopy    . Wisdom tooth extraction    . Dilatation & curettage/hysteroscopy with myosure N/A 11/02/2013    Procedure: DILATATION & CURETTAGE/HYSTEROSCOPY WITH MYOSURE ABLATION;  Surgeon: Allena Katz, MD;  Location: [place] [facility];  Service:  Gynecology;  Laterality: N/A;  . Left heart catheterization with coronary angiogram N/A 12/24/2010    Procedure: LEFT HEART CATHETERIZATION WITH CORONARY ANGIOGRAM;  Surgeon: Leonie Man, MD;  Location: [facility] CATH LAB;  Service: Cardiovascular;  Laterality: N/A;  . Total abdominal hysterectomy  November 2015     At [facility]: Robotic procedure with pelvic lymphadenectomy  . Transthoracic echocardiogram  06/13/2014    At [place]: EF 60-65%. Grade 1 diastolic dysfunction. Mild MR. Aortic sclerosis. Moderate pulmonary hypertension  . Pet myocardial perfusion scan  06/13/2014    At [place]: Moderate size, mild severity completely reversible defect involving the basal anterior, mid anterior and apical anterior segments consistent with ischemia. EF 65% with normal global function.  . Cardiac catheterization N/A 07/01/2014    Procedure: Left Heart Cath and Cors/Grafts Angiography;  Surgeon: Leonie Man, MD;  Location: [place] CV LAB;  Service: Cardiovascular; ostial LAD 80%, mid LAD 100% after S/P 2, 70% RI (no PCI target), osteo-proximal circumflex 99%, OM1 90%. Ostial RPDA 80% (similar to pre-CABG), occluded SVG-RPDA, patent LIMA-d LAD, SVG OM; potential culprit for abn nuclear scan - pLAD Dz with poor PDA  . Doppler echocardiography  12/08/2010    EF =>55%,MILD CONCENTRIC LEFT VENTRICULAR HYPERTROPHY  . Nm myocar single w/spect  07/26/2007    EF 79%, LEFT VENT.FUNCTION NORMAL    Recent Cardiac  Evaluation: From [facility] 2nd  Echocardiogram: EF 60-65%. Grade 1 diastolic dysfunction. Mild MR. Aortic sclerosis. Moderate pulmonary hypertension.  PET Myocardial Perfusion Scan:  Moderate size, mild severity completely reversible defect involving the basal anterior, mid anterior and apical anterior segments consistent with ischemia. EF 65% with normal global function.   Prior to Admission medications   Medication Sig Start Date End Date Taking? Authorizing Provider    Alpha-Lipoic Acid 600 MG CAPS Take 600 mg by mouth daily.   Yes Historical Provider, MD  aspirin 81 MG tablet Take 81 mg by mouth daily.     Yes Historical Provider, MD  atorvastatin (LIPITOR) 40 MG tablet Take 40 mg by mouth at bedtime.    Yes Historical Provider, MD  docusate sodium (COLACE) 100 MG capsule Take 100 mg by mouth daily as needed for mild constipation.   Yes Historical Provider, MD  gabapentin (NEURONTIN) 100 MG capsule Take 100 mg by mouth 3 (three) times daily.   Yes Historical Provider, MD  glipiZIDE (GLUCOTROL) 5 MG tablet Take 2.5 mg by mouth 2 (two) times daily as needed (only take in the morning if BS is above 75 and always take evening dose).    Yes Historical Provider, MD  isosorbide mononitrate (IMDUR) 30 MG 24 hr tablet Take 1 tablet (30 mg total) by mouth daily. 07/02/14  Yes Rhonda G Barrett, PA-C  loperamide (IMODIUM) 2 MG capsule Take 2 mg by mouth 4 (four) times daily as needed for diarrhea or loose stools.  Yes Historical Provider, MD  loratadine (CLARITIN) 10 MG tablet Take 10 mg by mouth daily.   Yes Historical Provider, MD  magnesium oxide (MAG-OX) 400 MG tablet Take 400 mg by mouth daily.   Yes Historical Provider, MD  metoprolol (LOPRESSOR) 50 MG tablet Take 1 tablet (50 mg total) by mouth 2 (two) times daily. 07/02/14  Yes Rhonda G Barrett, PA-C  nitroGLYCERIN (NITROSTAT) 0.4 MG SL tablet Place 1 tablet (0.4 mg total) under the tongue every 5 (five) minutes as needed for chest pain. 09/03/13  Yes Leonie Man, MD  nystatin (MYCOSTATIN) powder Apply 1 g topically daily as needed (breakout yeast infection).  06/14/14 06/14/15 Yes Historical Provider, MD  omeprazole (PRILOSEC) 40 MG capsule Take 40 mg by mouth daily.     Yes Historical Provider, MD  ondansetron (ZOFRAN) 4 MG tablet Take 4 mg by mouth every 8 (eight) hours as needed for nausea or vomiting.   Yes Historical Provider, MD  Probiotic Product (ALIGN PO) Take 1 each by mouth daily.   Yes Historical Provider,  MD  prochlorperazine (COMPAZINE) 10 MG tablet Take 10 mg by mouth every 6 (six) hours as needed for nausea or vomiting.   Yes Historical Provider, MD  promethazine (PHENERGAN) 25 MG tablet Take 25 mg by mouth every 6 (six) hours as needed for nausea or vomiting.   Yes Historical Provider, MD  Pyridoxine HCl (VITAMIN B-6 PO) Take 2 tablets by mouth daily.   Yes Historical Provider, MD  Vitamin D, Ergocalciferol, (DRISDOL) 50000 UNITS CAPS capsule Take 50,000 Units by mouth every 7 (seven) days. Wednesday   Yes Historical Provider, MD  levofloxacin (LEVAQUIN) 500 MG tablet Take 500 mg by mouth daily. Take for one week. 09/03/14   Historical Provider, MD   Also takes Furosemide 20 mg PO PRN edema  Allergies  Allergen Reactions  . Codeine Nausea Only  . Erythromycin Itching  . Lopid [Gemfibrozil] Itching  . Pravastatin Other (See Comments)    Used 01/2011 to 03/2011  . Simvastatin Other (See Comments)    Used 07/2007 to 11/2010 chg to pravastatin  . Sulfa Antibiotics     aki    Social History  Substance Use Topics  . Smoking status: Never Smoker   . Smokeless tobacco: Never Used  . Alcohol Use: 0.0 oz/week    0 Standard drinks or equivalent per week     Comment: "mixed drink once a year"   Family History  Problem Relation Age of Onset  . Coronary artery disease Father   . Coronary artery disease Sister   . Coronary artery disease Brother   . Heart attack Neg Hx   . Stroke Maternal Grandmother    ROS: A comprehensive Review of Systems - was performed. Review of Systems  Constitutional: Positive for weight loss (on Wgt watchers & increased walking) and malaise/fatigue (Much better of Chemo).  HENT: Negative for nosebleeds.   Respiratory: Negative for cough.   Cardiovascular: Negative for claudication.  Gastrointestinal: Negative for blood in stool and melena.  Genitourinary: Negative for hematuria.  Musculoskeletal: Positive for joint pain. Negative for falls.  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  Psychiatric/Behavioral: Negative for depression. The patient does not have insomnia.   All other systems reviewed and are negative.  Wt Readings from Last 3 Encounters:  03/06/15 155 lb 6.4 oz (70.489 kg)  09/06/14 149 lb (67.586  kg)  07/29/14 153 lb 3.5 oz (69.5 kg)   PHYSICAL EXAM BP 106/66 mmHg  Pulse 72  Ht 5' (1.524 m)  Wt 155 lb 6.4 oz (70.489 kg)  BMI 30.35 kg/m2 General appearance: alert, cooperative, appears stated age, no distress and Well-nourished and well-groomed. Very talkative. Intelligent gentleman. Answers questions appropriately.  Neck: no adenopathy, no carotid bruit, no JVD and supple, symmetrical, trachea midline  Lungs: CTAB, normal percussion bilaterally and Nonlabored, good air movement  Heart: RRR with ectopy, S1: normal, S2: physiologically split and Otherwise no M/R/G.  Abdomen: soft, non-tender; bowel sounds normal; no masses, no organomegaly  Extremities: extremities normal, atraumatic, no cyanosis or edema, no edema,  Pulses: 2+ and symmetric  Neurologic: Grossly normal   Adult ECG Report  Rate: 72 ;  Rhythm: normal sinus rhythm - able nonspecific ST-T wave abnormality  Narrative Interpretation: normal EKG   Recent Labs :  Checked by PCP.  ASSESSMENT / PLAN: Problem List Items Addressed This Visit    Obesity (BMI 30-39.9) (Chronic)    Partially with her cancer treatment and because it now her temps with diet and exercise, she is almost below the "obesity "category. She is opening get back into full exercise program.      Hyperlipidemia LDL goal <70 (Chronic)    She is on atorvastatin. I hope that when she has some of her chemotherapy related labs she can get her lipid levels checked. Have not seen recent labs.      Relevant Orders   EKG 12-Lead (Completed)   Essential hypertension (Chronic)    Well-controlled. She is  on stable dose of beta blocker and Imdur only.      Relevant Orders   EKG 12-Lead (Completed)   Coronary artery disease involving native coronary artery without angina pectoris (Chronic)    She does have significant native artery CAD, but after CABG is not had any recurrent anginal symptoms. PDA disease was stable, and not really PCI amenable. Her proximal LAD albeit diseased, does not have a lot of runoff in order to warrant intervention. She remains on aspirin, statin as well as Imdur and Lopressor.      Chronic kidney disease (CKD), stage IV (severe) (HCC) (Chronic)    Judicious consideration for invasive evaluation with cardiac catheterization if necessary in the future.      Relevant Orders   EKG 12-Lead (Completed)   CAD (coronary artery disease) of artery bypass graft - Primary (Chronic)    I suspect that the occluded vein graft to PDA is related to competitive flow from the native vessel. Unfortunately the native PDA is not at target for PCI. Thankfully other grafts are patent.      Relevant Orders   EKG 12-Lead (Completed)      Orders Placed This Encounter  Procedures  . EKG 12-Lead   Meds ordered this encounter  Medications  . ranitidine (ZANTAC) 300 MG tablet    Sig: Take 1 tablet by mouth at bedtime.     Patient Instructions:  NO CHANGE IN MEDICATIONS    Your physician wants you to follow-up in 6 months with Dr Micki Riley, Leonie Green, M.D., M.S. Interventional Cardiologist   Pager # [phone]

## 2015-03-09 ENCOUNTER — Encounter: Payer: Self-pay | Admitting: Cardiology

## 2015-03-09 NOTE — Assessment & Plan Note (Signed)
She is on atorvastatin. I hope that when she has some of her chemotherapy related labs she can get her lipid levels checked. Have not seen recent labs.

## 2015-03-09 NOTE — Assessment & Plan Note (Signed)
I suspect that the occluded vein graft to PDA is related to competitive flow from the native vessel. Unfortunately the native PDA is not at target for PCI. Thankfully other grafts are patent.

## 2015-03-09 NOTE — Assessment & Plan Note (Signed)
Well-controlled. She is on stable dose of beta blocker and Imdur only.

## 2015-03-09 NOTE — Assessment & Plan Note (Signed)
Judicious consideration for invasive evaluation with cardiac catheterization if necessary in the future.

## 2015-03-09 NOTE — Assessment & Plan Note (Addendum)
She does have significant native artery CAD, but after CABG is not had any recurrent anginal symptoms. PDA disease was stable, and not really PCI amenable. Her proximal LAD albeit diseased, does not have a lot of runoff in order to warrant intervention. She remains on aspirin, statin as well as Imdur and Lopressor.

## 2015-03-09 NOTE — Assessment & Plan Note (Signed)
Partially with her cancer treatment and because it now her temps with diet and exercise, she is almost below the "obesity "category. She is opening get back into full exercise program.

## 2015-03-21 DIAGNOSIS — R053 Chronic cough: Secondary | ICD-10-CM | POA: Insufficient documentation

## 2015-05-19 DIAGNOSIS — B372 Candidiasis of skin and nail: Secondary | ICD-10-CM | POA: Insufficient documentation

## 2015-06-10 DIAGNOSIS — R300 Dysuria: Secondary | ICD-10-CM | POA: Insufficient documentation

## 2015-08-11 ENCOUNTER — Telehealth: Payer: Self-pay | Admitting: Cardiology

## 2015-08-11 NOTE — Telephone Encounter (Signed)
Received records from [facility] for appointment on 09/22/15 with Dr Ellyn Hack.  Records given to [facility] for Dr Allison Quarry schedule on 09/22/15. lp

## 2015-08-20 ENCOUNTER — Ambulatory Visit (HOSPITAL_COMMUNITY)
Admission: RE | Admit: 2015-08-20 | Discharge: 2015-08-20 | Disposition: A | Discharge status: Home / Self Care | Payer: Medicare Other | Source: Ambulatory Visit | Attending: Urology | Admitting: Urology

## 2015-08-20 ENCOUNTER — Other Ambulatory Visit (HOSPITAL_COMMUNITY): Payer: Self-pay | Admitting: Urology

## 2015-08-20 DIAGNOSIS — C649 Malignant neoplasm of unspecified kidney, except renal pelvis: Secondary | ICD-10-CM | POA: Insufficient documentation

## 2015-08-20 DIAGNOSIS — R938 Abnormal findings on diagnostic imaging of other specified body structures: Secondary | ICD-10-CM | POA: Insufficient documentation

## 2015-08-27 ENCOUNTER — Other Ambulatory Visit (HOSPITAL_COMMUNITY): Payer: Self-pay | Admitting: Urology

## 2015-08-27 DIAGNOSIS — D4102 Neoplasm of uncertain behavior of left kidney: Secondary | ICD-10-CM

## 2015-08-27 DIAGNOSIS — D49512 Neoplasm of unspecified behavior of left kidney: Secondary | ICD-10-CM

## 2015-09-05 ENCOUNTER — Ambulatory Visit (HOSPITAL_COMMUNITY)
Admission: RE | Admit: 2015-09-05 | Discharge: 2015-09-05 | Disposition: A | Discharge status: Home / Self Care | Payer: Medicare Other | Source: Ambulatory Visit | Attending: Urology | Admitting: Urology

## 2015-09-05 DIAGNOSIS — Z905 Acquired absence of kidney: Secondary | ICD-10-CM | POA: Diagnosis not present

## 2015-09-05 DIAGNOSIS — K862 Cyst of pancreas: Secondary | ICD-10-CM | POA: Insufficient documentation

## 2015-09-05 DIAGNOSIS — N289 Disorder of kidney and ureter, unspecified: Secondary | ICD-10-CM | POA: Insufficient documentation

## 2015-09-05 DIAGNOSIS — D4102 Neoplasm of uncertain behavior of left kidney: Secondary | ICD-10-CM

## 2015-09-05 DIAGNOSIS — D49512 Neoplasm of unspecified behavior of left kidney: Secondary | ICD-10-CM | POA: Diagnosis not present

## 2015-09-22 ENCOUNTER — Ambulatory Visit: Payer: Medicare Other | Admitting: Cardiology

## 2015-09-26 ENCOUNTER — Ambulatory Visit (INDEPENDENT_AMBULATORY_CARE_PROVIDER_SITE_OTHER): Payer: Medicare Other | Admitting: Cardiology

## 2015-09-26 ENCOUNTER — Encounter: Payer: Self-pay | Admitting: Cardiology

## 2015-09-26 VITALS — BP 132/78 | HR 74 | Ht 60.0 in | Wt 156.0 lb

## 2015-09-26 DIAGNOSIS — E785 Hyperlipidemia, unspecified: Secondary | ICD-10-CM

## 2015-09-26 DIAGNOSIS — I2510 Atherosclerotic heart disease of native coronary artery without angina pectoris: Secondary | ICD-10-CM | POA: Diagnosis not present

## 2015-09-26 DIAGNOSIS — E669 Obesity, unspecified: Secondary | ICD-10-CM

## 2015-09-26 DIAGNOSIS — N184 Chronic kidney disease, stage 4 (severe): Secondary | ICD-10-CM

## 2015-09-26 DIAGNOSIS — I10 Essential (primary) hypertension: Secondary | ICD-10-CM | POA: Diagnosis not present

## 2015-09-26 DIAGNOSIS — I25708 Atherosclerosis of coronary artery bypass graft(s), unspecified, with other forms of angina pectoris: Secondary | ICD-10-CM | POA: Diagnosis not present

## 2015-09-26 DIAGNOSIS — Z951 Presence of aortocoronary bypass graft: Secondary | ICD-10-CM

## 2015-09-26 NOTE — Patient Instructions (Signed)
PLEASE HAVE LABS DONE CMP LIPID-- DO NOT EAT OR DRINK THE MORNING OF THE TESTS   NO CHANGES WITH MEDICATIONS   Your physician wants you to follow-up in: [place] [address] [place]. You will receive a reminder letter in the mail two months in advance. If you don't receive a letter, please call our office to schedule the follow-up appointment.   If you need a refill on your cardiac medications before your next appointment, please call your pharmacy.

## 2015-09-26 NOTE — Progress Notes (Signed)
PCP: Stephens Shire, MD -- needs new PCP Nephrologist: Dr. Joelyn Oms  Clinic Note: Chief Complaint  Patient presents with  . Follow-up    6 MONTHS  . Coronary Artery Disease   HPI: Dana Jackson is a 70 y.o. female with a PMH of prior TIA and carotid directly as well as partial nephrectomy from renal cell carcinoma with residual CK D-3B who presents today for followup of her CAD status post CABG. She has been treated at [facility] for endometrial cancer status post TAH/BSO followed by chemotherapy and radiation - now on maintenance Rx.  Finally growing some hair back. Last Seen in Feb 2017.    No recent hospitalizations. Studies Reviewed: no new studies  Interval History: She resents today doing relatively well. She has some mild exertional dyspnea if she overdoes it.  She is trying to actually start exercising/walking. She is concerned that she actually gained back to much weight and is wanting to get back on weight watchers. She says that the weather is a slow time for her sports exercising because of her legs and back all her. She is therefore been less active. Now with trying to exercise, she usesa "stool cane" when walking to give herself a rest. Energy level better - off chemo.   She denies any resting or exertional chest pain/anginal symptoms. No PND, orthopnea or edema.  Remainder of her Cardiovascular ROS is as follows:  No palpitations, weakness or syncope/near syncope.  No TIA/amaurosis fugax symptoms.  No melena, hematochezia, hematuria, or epstaxis.  No claudication.  She still has some occasional lightheadedness and dizziness that is positional most. If she overdoes her exercise she will get short of breath.  Past Medical History:  Diagnosis Date  . Arthritis   . Asthma    childhood  . Bacteremia due to Escherichia coli June 2015    Currently being treated with anti-biotics  . CAD in native artery 12/2010   a) Cath for exertional angina & EKG changes:  40% LM, 80% mid LAD.  95% ost cX, 80-90% PDA --> CABG X3; b) Post CABG CATH for + Myoview with basal anterior ischemia -> Ost LAD 80% (diffuse) then 100% after SP2, RI 70% (too small for PCI), Ost-prox Cx 99% & OM1 90%, OstrPDA 80% (small); Occluded SVG-rPDA.  Patent LIMA-dLAD, SVG-OM: culprit ~ p-m LAD pre-LIMA & RI - not good PCI target --> Med Rx  . CKD (chronic kidney disease) stage 3, GFR 30-59 ml/min   . Degenerative disc disease, cervical    Degenerative disc disease, cervical [722.4]  . Endometrial cancer [facility]) November 2015   Treated with TAH with pelvic lymphadenectomy followed by radiation and chemotherapy  . GERD (gastroesophageal reflux disease)   . History of asthma     childhood  . History of pneumonia    "2-3 times"  . History of unstable angina November/December 2012   T wave inversions in inferolateral leads.  No stress test performed.  . Hyperlipidemia LDL goal <70   . Hypertension, essential, benign   . Pneumonia    walking  . Renal cell carcinoma ([place]) 11/18/2008   T2aNx s/p partial left nephrectomy  . 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 Stroke [facility])    TIA  . TIA (transient ischemic attack) 1992 &  2010   Past Surgical History:  Procedure Laterality Date  . ANTERIOR CERVICAL DECOMP/DISCECTOMY FUSION  10/11/2005   multi-level  . CARDIAC CATHETERIZATION  12/24/10  40% left main, 80% mid LAD, 95% ostial circumflex, 80-90% PDA.  [name] CARDIAC CATHETERIZATION N/A 07/01/2014   Procedure: Left Heart Cath and Cors/Grafts Angiography;  Surgeon: Leonie Man, MD;  Location: [facility] INVASIVE CV LAB:  For Abn Nuc @ [facility]: Ost LAD 80%, mid LAD 100% after S/P 2, 70% RI (no PCI target), ost-prox Cx 99%, OM1 90%. Ost rPDA 80% (~ pre-CABG), occluded SVG-rPDA, patent LIMA-dLAD, SVG OM; potential culprit for abn Nuc scan = pLAD Dz, small RI 70% or PDA -> med Rx  . CAROTID ENDARTERECTOMY Left   . CHOLECYSTECTOMY  ~ 1971  . COLONOSCOPY    . CORONARY ARTERY  BYPASS GRAFT  12/25/2010   Procedure: CORONARY ARTERY BYPASS GRAFTING (CABGX3 - LIMA-LAD, SVG RPL, SVG-Circumflex);  Surgeon: Rexene Alberts, MD;  Location: [place];  Service: Open Heart Surgery;  Laterality: N/A;  . DILATATION & CURETTAGE/HYSTEROSCOPY WITH MYOSURE N/A 11/02/2013   Procedure: DILATATION & CURETTAGE/HYSTEROSCOPY WITH MYOSURE ABLATION;  Surgeon: Allena Katz, MD;  Location: [facility];  Service: Gynecology;  Laterality: N/A;  . DOPPLER ECHOCARDIOGRAPHY  12/08/2010   EF =>55%,MILD CONCENTRIC LEFT VENTRICULAR HYPERTROPHY  . LEFT HEART CATHETERIZATION WITH CORONARY ANGIOGRAM N/A 12/24/2010   Procedure: LEFT HEART CATHETERIZATION WITH CORONARY ANGIOGRAM;  Surgeon: Leonie Man, MD;  Location: [facility] CATH LAB;  Service: Cardiovascular;  Laterality: N/A;  . NM MYOCAR SINGLE W/SPECT  07/26/2007   EF 79%, LEFT VENT.FUNCTION NORMAL  . PARTIAL NEPHRECTOMY Left 11/18/2008   left partial nephrectomy for renal cell CA  . PET Myocardial Perfusion Scan  06/13/2014   At [place]: Moderate size, mild severity completely reversible defect involving the basal anterior, mid anterior and apical anterior segments consistent with ischemia. EF 65% with normal global function.  . TONSILLECTOMY     "in college"  . TOTAL ABDOMINAL HYSTERECTOMY  November 2015    At [facility]: Robotic procedure with pelvic lymphadenectomy  . TRANSTHORACIC ECHOCARDIOGRAM  06/13/2014   At [place]: EF 60-65%. Grade 1 diastolic dysfunction. Mild MR. Aortic sclerosis. Moderate pulmonary hypertension  . TUBAL LIGATION  ~ 1984  . UPPER GI ENDOSCOPY    . WISDOM TOOTH EXTRACTION      Prior to Admission medications   Medication Sig Start Date Taking? Authorizing Provider  Alpha-Lipoic Acid 600 MG CAPS Take 600 mg by mouth daily.  Yes Historical Provider, MD  aspirin 81 MG tablet Take 81 mg by mouth daily.    Yes Historical Provider, MD  atorvastatin (LIPITOR) 40 MG tablet Take 40 mg by mouth at bedtime.   Yes  Historical Provider, MD  calcitRIOL (ROCALTROL) 0.25 MCG capsule Take 0.25 mcg by mouth daily.  Yes Historical Provider, MD  docusate sodium (COLACE) 100 MG capsule Take 100 mg by mouth daily as needed for mild constipation.  Yes Historical Provider, MD  gabapentin (NEURONTIN) 100 MG capsule Take 100 mg by mouth 3 (three) times daily.  Yes Historical Provider, MD  glipiZIDE (GLUCOTROL) 5 MG tablet Take 2.5 mg by mouth 2 (two) times daily as needed (only take in the morning if BS is above 75 and always take evening dose).   Yes Historical Provider, MD  isosorbide mononitrate (IMDUR) 30 MG 24 hr tablet Take 1 tablet (30 mg total) by mouth daily. 10/31/14 Yes Leonie Man, MD  loperamide (IMODIUM) 2 MG capsule Take 2 mg by mouth 4 (four) times daily as needed for diarrhea or loose stools.  Yes Historical Provider, MD  loratadine (CLARITIN) 10  MG tablet Take 10 mg by mouth daily.  Yes Historical Provider, MD  magnesium oxide (MAG-OX) 400 MG tablet Take 400 mg by mouth daily.  Yes Historical Provider, MD  metoprolol (LOPRESSOR) 50 MG tablet Take 1 tablet (50 mg total) by mouth 2 (two) times daily. 07/02/14 Yes Rhonda G Barrett, PA-C  NITROSTAT 0.4 MG SL tablet PLACE 1 TABLET (0.4 MG TOTAL) UNDER THE TONGUE EVERY 5 (FIVE) MINUTES AS NEEDED FOR CHEST PAIN. 11/18/14 Yes Leonie Man, MD  omeprazole (PRILOSEC) 40 MG capsule Take 40 mg by mouth daily.    Yes Historical Provider, MD  ondansetron (ZOFRAN) 4 MG tablet Take 4 mg by mouth every 8 (eight) hours as needed for nausea or vomiting.  Yes Historical Provider, MD  Probiotic Product (ALIGN PO) Take 1 each by mouth daily.  Yes Historical Provider, MD  prochlorperazine (COMPAZINE) 10 MG tablet Take 10 mg by mouth every 6 (six) hours as needed for nausea or vomiting.  Yes Historical Provider, MD  promethazine (PHENERGAN) 25 MG tablet Take 25 mg by mouth every 6 (six) hours as needed for nausea or vomiting.  Yes Historical Provider, MD  Pyridoxine HCl  (VITAMIN B-6 PO) Take 2 tablets by mouth daily.  Yes Historical Provider, MD  ranitidine (ZANTAC) 300 MG tablet Take 1 tablet by mouth at bedtime. 01/20/15 Yes Historical Provider, MD  trimethoprim (TRIMPEX) 100 MG tablet Take 50 mg by mouth at bedtime.  Yes Historical Provider, MD  furosemide 20 mg Take 1 tablet (20 mg) daily and as needed for swelling        Allergies  Allergen Reactions  . Codeine Nausea Only  . Erythromycin Itching  . Lopid [Gemfibrozil] Itching  . Pravastatin Other (See Comments)    Used 01/2011 to 03/2011  . Simvastatin Other (See Comments)    Used 07/2007 to 11/2010 chg to pravastatin  . Sulfa Antibiotics     aki    Social History  Substance Use Topics  . Smoking status: Never Smoker  . Smokeless tobacco: Never Used  . Alcohol use 0.0 oz/week     Comment: "mixed drink once a year"   Family History  Problem Relation Age of Onset  . Coronary artery disease Father   . Coronary artery disease Sister   . Coronary artery disease Brother   . Stroke Maternal Grandmother   . Heart attack Neg Hx    ROS: A comprehensive Review of Systems - was performed. Review of Systems  Constitutional: Positive for malaise/fatigue (Much better of Chemo) and weight loss (on W[employer] watchers & increased walking).  HENT: Negative for nosebleeds.   Respiratory: Negative for cough.   Cardiovascular: Negative for claudication.  Gastrointestinal: Negative for blood in stool and melena.  Genitourinary: Negative for hematuria.  Musculoskeletal: Positive for joint pain. Negative for falls.  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  Psychiatric/Behavioral: Negative for depression. The patient does not have insomnia.   All other systems reviewed and are negative.  Wt Readings from Last 3 Encounters:  09/26/15 156 lb (70.8 kg)  03/06/15 155 lb 6.4 oz (70.5 kg)  09/06/14 149 lb (67.6 kg)      PHYSICAL EXAM BP 132/78   Pulse 74   Ht 5' (1.524 m)   Wt 156 lb (70.8 kg)   BMI 30.47 kg/m  General appearance: alert, cooperative, appears stated age, no distress and Well-nourished and well-groomed.  Very talkative. Intelligent gentleman. Answers questions appropriately.  Neck: no adenopathy, no carotid bruit, no JVD and supple, symmetrical, trachea midline  Lungs: CTAB, normal percussion bilaterally and Nonlabored, good air movement  Heart: RRR with ectopy, S1: normal, S2: physiologically split and Otherwise noR/G. Soft 1-2/6 SEM @ RUSB (from Ao Sclerosis) Abdomen: soft, non-tender; bowel sounds normal; no masses, no organomegaly  Extremities: extremities normal, atraumatic, no cyanosis or edema, no edema,  Pulses: 2+ and symmetric  Neurologic: Grossly normal   Adult ECG Report  Rate: 74;  Rhythm: normal sinus rhythm - CRO ant AM, age undetermined.  Narrative Interpretation: Stable EKG   Recent Labs :  Checked by PCP. - no recent labs  ASSESSMENT / PLAN: Problem List Items Addressed This Visit    Obesity (BMI 30-39.9) (Chronic)    She has now gained back most of her cancer-related weight loss. Now working on [employer] and trying to increase exercise. Deathly deconditioned and therefore notes exertional dyspnea      Hyperlipidemia LDL goal <70 - Primary (Chronic)    On atorvastatin.  I have not seen recent lipid panel. Will provide lab slip for CMP and FLP to be checked at her next oncology appointment.      Relevant Orders   EKG 12-Lead   Lipid panel   Comprehensive metabolic panel   Essential hypertension (Chronic)    Well-controlled on current medications. Just beta blocker and Imdur.      Relevant Orders   EKG 12-Lead   Lipid panel   Comprehensive metabolic panel   Coronary artery disease involving native coronary artery without angina pectoris (Chronic)    Doing well without any significant anginal symptoms. Severe native disease with now an occluded  vein graft to the right. Currently treating medically as she was asymptomatic at the time of her abnormal stress test. Would probably not repeat stress test if she has significant symptoms in the future, would simply go to the Cath Lab. She remains on aspirin, statin, beta blocker and Imdur.      Relevant Orders   EKG 12-Lead   Lipid panel   Comprehensive metabolic panel   Chronic kidney disease (CKD), stage IV (severe) (HCC) (Chronic)    Followed by Dr. Joelyn Oms. Most recent creatinine from his note back in July was 2.79. Based on these findings, I would need to have significant symptoms to consider catheterization again.      CAD (coronary artery disease) of artery bypass graft (Chronic)    Occluded vein graft to a relatively small caliber PDA. She likely had enough flow in the native vessel. Unfortunately it is awfully small and probably not a good PCI target. Since both the OM and LAD occluded, I would suspect that the remaining LIMA and SVG OM and OM grafts should do well. Continue medical management.      Relevant Orders   EKG 12-Lead   Lipid panel   Comprehensive metabolic panel    Other Visit Diagnoses   None.     Orders Placed This Encounter  Procedures  . Lipid panel    Order Specific Question:   Has the patient fasted?    Answer:   Yes  . Comprehensive metabolic panel    Order Specific Question:   Has the patient fasted?    Answer:   Yes  . EKG 12-Lead    Order Specific Question:   Where should this test be performed    Answer:   Other   Meds ordered this  encounter  Medications  . calcitRIOL (ROCALTROL) 0.25 MCG capsule    Sig: Take 0.25 mcg by mouth daily.  [name] trimethoprim (TRIMPEX) 100 MG tablet    Sig: Take 50 mg by mouth at bedtime.     Patient Instructions:  NO CHANGE IN MEDICATIONS  PLEASE HAVE LABS DONE CMP LIPID-- DO NOT EAT OR DRINK THE MORNING OF THE TESTS  ROV:  6 months with Dr Ellyn Hack   Glenetta Hew, M.D., M.S. Interventional  Cardiologist   Pager # [phone]

## 2015-09-27 ENCOUNTER — Encounter: Payer: Self-pay | Admitting: Cardiology

## 2015-09-27 NOTE — Assessment & Plan Note (Signed)
On atorvastatin.  I have not seen recent lipid panel. Will provide lab slip for CMP and FLP to be checked at her next oncology appointment.

## 2015-09-27 NOTE — Assessment & Plan Note (Signed)
Occluded vein graft to a relatively small caliber PDA. She likely had enough flow in the native vessel. Unfortunately it is awfully small and probably not a good PCI target. Since both the OM and LAD occluded, I would suspect that the remaining LIMA and SVG OM and OM grafts should do well. Continue medical management.

## 2015-09-27 NOTE — Assessment & Plan Note (Signed)
Well-controlled on current medications. Just beta blocker and Imdur.

## 2015-09-27 NOTE — Assessment & Plan Note (Signed)
She has now gained back most of her cancer-related weight loss. Now working on [employer] and trying to increase exercise. Deathly deconditioned and therefore notes exertional dyspnea

## 2015-09-27 NOTE — Assessment & Plan Note (Addendum)
Followed by Dr. Joelyn Oms. Most recent creatinine from his note back in July was 2.79. Based on these findings, I would need to have significant symptoms to consider catheterization again.

## 2015-09-27 NOTE — Assessment & Plan Note (Signed)
Doing well without any significant anginal symptoms. Severe native disease with now an occluded vein graft to the right. Currently treating medically as she was asymptomatic at the time of her abnormal stress test. Would probably not repeat stress test if she has significant symptoms in the future, would simply go to the Cath Lab. She remains on aspirin, statin, beta blocker and Imdur.

## 2015-10-06 LAB — LIPID PANEL W/O CHOL/HDL RATIO
Cholesterol, Total: 197 mg/dL (ref 100–199)
HDL: 29 mg/dL — ABNORMAL LOW (ref >39)
LDL Calculated: 102 mg/dL — ABNORMAL HIGH (ref 0–99)
Triglycerides: 331 mg/dL — ABNORMAL HIGH (ref 0–149)
VLDL CHOLESTEROL CAL: 66 mg/dL — AB (ref 5–40)

## 2015-10-06 LAB — COMPREHENSIVE METABOLIC PANEL
A/G RATIO: 1.7 (ref 1.2–2.2)
ALT: 19 IU/L (ref 0–32)
AST: 20 IU/L (ref 0–40)
Albumin: 3.8 g/dL (ref 3.5–4.8)
Alkaline Phosphatase: 79 IU/L (ref 39–117)
BUN/Creatinine Ratio: 17 (ref 12–28)
BUN: 49 mg/dL — ABNORMAL HIGH (ref 8–27)
Bilirubin Total: 0.2 mg/dL (ref 0.0–1.2)
CALCIUM: 9.4 mg/dL (ref 8.7–10.3)
CO2: 22 mmol/L (ref 18–29)
CREATININE: 2.9 mg/dL — AB (ref 0.57–1.00)
Chloride: 105 mmol/L (ref 96–106)
GFR, EST AFRICAN AMERICAN: 18 mL/min/{1.73_m2} — AB (ref >59)
GFR, EST NON AFRICAN AMERICAN: 16 mL/min/{1.73_m2} — AB (ref >59)
Globulin, Total: 2.2 g/dL (ref 1.5–4.5)
Glucose: 51 mg/dL — ABNORMAL LOW (ref 65–99)
Potassium: 5.3 mmol/L — ABNORMAL HIGH (ref 3.5–5.2)
Sodium: 143 mmol/L (ref 134–144)
TOTAL PROTEIN: 6 g/dL (ref 6.0–8.5)

## 2015-10-06 LAB — SPECIMEN STATUS REPORT

## 2015-10-15 ENCOUNTER — Other Ambulatory Visit: Payer: Self-pay | Admitting: Nephrology

## 2015-10-15 ENCOUNTER — Ambulatory Visit
Admission: RE | Admit: 2015-10-15 | Discharge: 2015-10-15 | Disposition: A | Discharge status: Home / Self Care | Payer: Medicare Other | Source: Ambulatory Visit | Attending: Nephrology | Admitting: Nephrology

## 2015-10-15 DIAGNOSIS — N184 Chronic kidney disease, stage 4 (severe): Secondary | ICD-10-CM

## 2015-10-20 ENCOUNTER — Other Ambulatory Visit: Payer: Self-pay | Admitting: *Deleted

## 2015-10-20 DIAGNOSIS — Z5189 Encounter for other specified aftercare: Secondary | ICD-10-CM

## 2015-10-20 DIAGNOSIS — E785 Hyperlipidemia, unspecified: Secondary | ICD-10-CM

## 2015-11-22 ENCOUNTER — Emergency Department (HOSPITAL_COMMUNITY): Payer: Medicare Other

## 2015-11-22 ENCOUNTER — Emergency Department (HOSPITAL_COMMUNITY)
Admission: EM | Admit: 2015-11-22 | Discharge: 2015-11-22 | Disposition: A | Discharge status: Home / Self Care | Payer: Medicare Other | Attending: Emergency Medicine | Admitting: Emergency Medicine

## 2015-11-22 ENCOUNTER — Encounter (HOSPITAL_COMMUNITY): Payer: Self-pay | Admitting: Emergency Medicine

## 2015-11-22 DIAGNOSIS — S0512XA Contusion of eyeball and orbital tissues, left eye, initial encounter: Secondary | ICD-10-CM | POA: Insufficient documentation

## 2015-11-22 DIAGNOSIS — Z79899 Other long term (current) drug therapy: Secondary | ICD-10-CM | POA: Diagnosis not present

## 2015-11-22 DIAGNOSIS — S60512A Abrasion of left hand, initial encounter: Secondary | ICD-10-CM | POA: Insufficient documentation

## 2015-11-22 DIAGNOSIS — N184 Chronic kidney disease, stage 4 (severe): Secondary | ICD-10-CM | POA: Insufficient documentation

## 2015-11-22 DIAGNOSIS — I129 Hypertensive chronic kidney disease with stage 1 through stage 4 chronic kidney disease, or unspecified chronic kidney disease: Secondary | ICD-10-CM | POA: Insufficient documentation

## 2015-11-22 DIAGNOSIS — S60511A Abrasion of right hand, initial encounter: Secondary | ICD-10-CM | POA: Diagnosis not present

## 2015-11-22 DIAGNOSIS — H578 Other specified disorders of eye and adnexa: Secondary | ICD-10-CM | POA: Insufficient documentation

## 2015-11-22 DIAGNOSIS — T07XXXA Unspecified multiple injuries, initial encounter: Secondary | ICD-10-CM

## 2015-11-22 DIAGNOSIS — S0990XA Unspecified injury of head, initial encounter: Secondary | ICD-10-CM | POA: Diagnosis not present

## 2015-11-22 DIAGNOSIS — Z7982 Long term (current) use of aspirin: Secondary | ICD-10-CM | POA: Diagnosis not present

## 2015-11-22 DIAGNOSIS — W0110XA Fall on same level from slipping, tripping and stumbling with subsequent striking against unspecified object, initial encounter: Secondary | ICD-10-CM | POA: Insufficient documentation

## 2015-11-22 DIAGNOSIS — Y929 Unspecified place or not applicable: Secondary | ICD-10-CM | POA: Insufficient documentation

## 2015-11-22 DIAGNOSIS — Z7984 Long term (current) use of oral hypoglycemic drugs: Secondary | ICD-10-CM | POA: Insufficient documentation

## 2015-11-22 DIAGNOSIS — S0083XA Contusion of other part of head, initial encounter: Secondary | ICD-10-CM

## 2015-11-22 DIAGNOSIS — Y9301 Activity, walking, marching and hiking: Secondary | ICD-10-CM | POA: Diagnosis not present

## 2015-11-22 DIAGNOSIS — Y999 Unspecified external cause status: Secondary | ICD-10-CM | POA: Diagnosis not present

## 2015-11-22 DIAGNOSIS — J45909 Unspecified asthma, uncomplicated: Secondary | ICD-10-CM | POA: Diagnosis not present

## 2015-11-22 DIAGNOSIS — I2510 Atherosclerotic heart disease of native coronary artery without angina pectoris: Secondary | ICD-10-CM | POA: Diagnosis not present

## 2015-11-22 DIAGNOSIS — S0592XA Unspecified injury of left eye and orbit, initial encounter: Secondary | ICD-10-CM | POA: Diagnosis present

## 2015-11-22 DIAGNOSIS — Z8673 Personal history of transient ischemic attack (TIA), and cerebral infarction without residual deficits: Secondary | ICD-10-CM | POA: Diagnosis not present

## 2015-11-22 DIAGNOSIS — Z951 Presence of aortocoronary bypass graft: Secondary | ICD-10-CM | POA: Insufficient documentation

## 2015-11-22 DIAGNOSIS — H1132 Conjunctival hemorrhage, left eye: Secondary | ICD-10-CM

## 2015-11-22 MED ORDER — BACITRACIN ZINC 500 UNIT/GM EX OINT
TOPICAL_OINTMENT | Freq: Two times a day (BID) | CUTANEOUS | Status: DC
  Administered 2015-11-22: 1 via TOPICAL
  Filled 2015-11-22: qty 0.9

## 2015-11-22 MED ORDER — TETANUS-DIPHTH-ACELL PERTUSSIS 5-2.5-18.5 LF-MCG/0.5 IM SUSP
0.5000 mL | Freq: Once | INTRAMUSCULAR | Status: AC
  Administered 2015-11-22: 0.5 mL via INTRAMUSCULAR
  Filled 2015-11-22: qty 0.5

## 2015-11-22 MED ORDER — HYDROCODONE-ACETAMINOPHEN 5-325 MG PO TABS
1.0000 | ORAL_TABLET | Freq: Once | ORAL | Status: AC
  Administered 2015-11-22: 1 via ORAL
  Filled 2015-11-22: qty 1

## 2015-11-22 NOTE — Discharge Instructions (Signed)
Apply bacitracin to your abrasions until they are healed. They will not appear shiny once this happens. Apply ice to your eye for 10-20 minutes 3 times daily. Keep your head slightly elevated to help with swelling. Take your home pain medications. Follow-up with your primary care physician in one week.Return to the emergency department for sudden worsening pain, visual changes, fever, increased swelling or redness around her abrasions, Pus drainage, or any new or concerning symptoms.

## 2015-11-22 NOTE — ED Provider Notes (Signed)
[facility] DEPT Provider Note   CSN: [id number] Arrival date & time: 11/22/15  1219  By signing my name below, I, Delton Prairie, attest that this documentation has been prepared under the direction and in the presence of  Gloriann Loan, PA-C. Electronically Signed: Delton Prairie, ED Scribe. 11/22/15. 1:12 PM.   History   Chief Complaint Chief Complaint  Patient presents with  . Fall   The history is provided by the patient. No language interpreter was used.   HPI Comments:  Dana Jackson is a 70 y.o. female, with a hx of cancer and CAD, who presents to the Emergency Department complaining of sudden onset bruising, swelling and pain surrounding her left eye s/p a fall which occurred prior to arrival. Pt states she was walking when she tripped, fell and hit her head. She notes she was able to get up a few minutes after falling. Pt also notes associated abrasions to bilateral hands. She denies chest pain, SOB or dizziness prior to falling. Pt also denies LOC, neck pain, eye pain, eye pain with movement, visual disturbances, vomiting and any other complaints at this time. Pt takes aspirin on a daily basis. Last tetanus unknown.   Past Medical History:  Diagnosis Date  . Arthritis   . Asthma    childhood  . Bacteremia due to Escherichia coli June 2015    Currently being treated with anti-biotics  . CAD in native artery 12/2010   a) Cath for exertional angina & EKG changes: 40% LM, 80% mid LAD.  95% ost cX, 80-90% PDA --> CABG X3; b) Post CABG CATH for + Myoview with basal anterior ischemia -> Ost LAD 80% (diffuse) then 100% after SP2, RI 70% (too small for PCI), Ost-prox Cx 99% & OM1 90%, OstrPDA 80% (small); Occluded SVG-rPDA.  Patent LIMA-dLAD, SVG-OM: culprit ~ p-m LAD pre-LIMA & RI - not good PCI target --> Med Rx  . CKD (chronic kidney disease) stage 3, GFR 30-59 ml/min   . Degenerative disc disease, cervical    Degenerative disc disease, cervical [722.4]  . Endometrial cancer [facility])  November 2015   Treated with TAH with pelvic lymphadenectomy followed by radiation and chemotherapy  . GERD (gastroesophageal reflux disease)   . History of asthma     childhood  . History of pneumonia    "2-3 times"  . History of unstable angina November/December 2012   T wave inversions in inferolateral leads.  No stress test performed.  . Hyperlipidemia LDL goal <70   . Hypertension, essential, benign   . Pneumonia    walking  . Renal cell carcinoma ([place]) 11/18/2008   T2aNx s/p partial left nephrectomy  . 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 Stroke [facility])    TIA  . TIA (transient ischemic attack) 1992 &  2010    Patient Active Problem List   Diagnosis Date Noted  . Coronary artery disease involving native coronary artery without angina pectoris 09/08/2014  . Acute-on-chronic kidney injury ([place]) 07/29/2014  . Hyperkalemia 07/29/2014  . CAD (coronary artery disease) of artery bypass graft 07/29/2014  . Type 2 diabetes mellitus with renal manifestations, controlled ([place]) 07/10/2014  . Endometrial cancer ([place]) 07/10/2014  . Obesity (BMI 30-39.9) 09/03/2012  . Hyperlipidemia LDL goal <70   . Anemia 12/28/2010  . S/P CABG x 3 12/25/2010  . Renal cell carcinoma ([place]) 12/23/2010    Class: History of  . Chronic kidney disease (CKD), stage IV (severe) ([place]) 12/23/2010  Class: Diagnosis of  . Essential hypertension 12/23/2010    Class: History of    Past Surgical History:  Procedure Laterality Date  . ANTERIOR CERVICAL DECOMP/DISCECTOMY FUSION  10/11/2005   multi-level  . CARDIAC CATHETERIZATION  12/24/10   40% left main, 80% mid LAD, 95% ostial circumflex, 80-90% PDA.  [name] CARDIAC CATHETERIZATION N/A 07/01/2014   Procedure: Left Heart Cath and Cors/Grafts Angiography;  Surgeon: Leonie Man, MD;  Location: [facility] INVASIVE CV LAB:  For Abn Nuc @ [facility]: Ost LAD 80%, mid LAD 100% after S/P 2, 70% RI (no PCI target), ost-prox Cx 99%, OM1 90%. Ost rPDA 80%  (~ pre-CABG), occluded SVG-rPDA, patent LIMA-dLAD, SVG OM; potential culprit for abn Nuc scan = pLAD Dz, small RI 70% or PDA -> med Rx  . CAROTID ENDARTERECTOMY Left   . CHOLECYSTECTOMY  ~ 1971  . COLONOSCOPY    . CORONARY ARTERY BYPASS GRAFT  12/25/2010   Procedure: CORONARY ARTERY BYPASS GRAFTING (CABGX3 - LIMA-LAD, SVG RPL, SVG-Circumflex);  Surgeon: Rexene Alberts, MD;  Location: [facility];  Service: Open Heart Surgery;  Laterality: N/A;  . DILATATION & CURETTAGE/HYSTEROSCOPY WITH MYOSURE N/A 11/02/2013   Procedure: DILATATION & CURETTAGE/HYSTEROSCOPY WITH MYOSURE ABLATION;  Surgeon: Allena Katz, MD;  Location: [facility];  Service: Gynecology;  Laterality: N/A;  . DOPPLER ECHOCARDIOGRAPHY  12/08/2010   EF =>55%,MILD CONCENTRIC LEFT VENTRICULAR HYPERTROPHY  . LEFT HEART CATHETERIZATION WITH CORONARY ANGIOGRAM N/A 12/24/2010   Procedure: LEFT HEART CATHETERIZATION WITH CORONARY ANGIOGRAM;  Surgeon: Leonie Man, MD;  Location: [facility] CATH LAB;  Service: Cardiovascular;  Laterality: N/A;  . NM MYOCAR SINGLE W/SPECT  07/26/2007   EF 79%, LEFT VENT.FUNCTION NORMAL  . PARTIAL NEPHRECTOMY Left 11/18/2008   left partial nephrectomy for renal cell CA  . PET Myocardial Perfusion Scan  06/13/2014   At [place]: Moderate size, mild severity completely reversible defect involving the basal anterior, mid anterior and apical anterior segments consistent with ischemia. EF 65% with normal global function.  . TONSILLECTOMY     "in college"  . TOTAL ABDOMINAL HYSTERECTOMY  November 2015    At [facility]: Robotic procedure with pelvic lymphadenectomy  . TRANSTHORACIC ECHOCARDIOGRAM  06/13/2014   At [place]: EF 60-65%. Grade 1 diastolic dysfunction. Mild MR. Aortic sclerosis. Moderate pulmonary hypertension  . TUBAL LIGATION  ~ 1984  . UPPER GI ENDOSCOPY    . WISDOM TOOTH EXTRACTION      OB History    No data available       Home Medications    Prior to Admission medications     Medication Sig Start Date End Date Taking? Authorizing Provider  Alpha-Lipoic Acid 600 MG CAPS Take 600 mg by mouth daily.    Historical Provider, MD  aspirin 81 MG tablet Take 81 mg by mouth daily.      Historical Provider, MD  atorvastatin (LIPITOR) 40 MG tablet Take 40 mg by mouth at bedtime.     Historical Provider, MD  calcitRIOL (ROCALTROL) 0.25 MCG capsule Take 0.25 mcg by mouth daily.    Historical Provider, MD  docusate sodium (COLACE) 100 MG capsule Take 100 mg by mouth daily as needed for mild constipation.    Historical Provider, MD  gabapentin (NEURONTIN) 100 MG capsule Take 100 mg by mouth 3 (three) times daily.    Historical Provider, MD  glipiZIDE (GLUCOTROL) 5 MG tablet Take 2.5 mg by mouth 2 (two) times daily as needed (only take in the morning  if BS is above 75 and always take evening dose).     Historical Provider, MD  isosorbide mononitrate (IMDUR) 30 MG 24 hr tablet Take 1 tablet (30 mg total) by mouth daily. 10/31/14   Leonie Man, MD  loperamide (IMODIUM) 2 MG capsule Take 2 mg by mouth 4 (four) times daily as needed for diarrhea or loose stools.    Historical Provider, MD  loratadine (CLARITIN) 10 MG tablet Take 10 mg by mouth daily.    Historical Provider, MD  magnesium oxide (MAG-OX) 400 MG tablet Take 400 mg by mouth daily.    Historical Provider, MD  metoprolol (LOPRESSOR) 50 MG tablet Take 1 tablet (50 mg total) by mouth 2 (two) times daily. 07/02/14   Rhonda G Barrett, PA-C  NITROSTAT 0.4 MG SL tablet PLACE 1 TABLET (0.4 MG TOTAL) UNDER THE TONGUE EVERY 5 (FIVE) MINUTES AS NEEDED FOR CHEST PAIN. 11/18/14   Leonie Man, MD  omeprazole (PRILOSEC) 40 MG capsule Take 40 mg by mouth daily.      Historical Provider, MD  ondansetron (ZOFRAN) 4 MG tablet Take 4 mg by mouth every 8 (eight) hours as needed for nausea or vomiting.    Historical Provider, MD  Probiotic Product (ALIGN PO) Take 1 each by mouth daily.    Historical Provider, MD  prochlorperazine (COMPAZINE)  10 MG tablet Take 10 mg by mouth every 6 (six) hours as needed for nausea or vomiting.    Historical Provider, MD  promethazine (PHENERGAN) 25 MG tablet Take 25 mg by mouth every 6 (six) hours as needed for nausea or vomiting.    Historical Provider, MD  Pyridoxine HCl (VITAMIN B-6 PO) Take 2 tablets by mouth daily.    Historical Provider, MD  ranitidine (ZANTAC) 300 MG tablet Take 1 tablet by mouth at bedtime. 01/20/15   Historical Provider, MD  trimethoprim (TRIMPEX) 100 MG tablet Take 50 mg by mouth at bedtime.    Historical Provider, MD    Family History Family History  Problem Relation Age of Onset  . Coronary artery disease Father   . Coronary artery disease Sister   . Coronary artery disease Brother   . Stroke Maternal Grandmother   . Heart attack Neg Hx     Social History Social History  Substance Use Topics  . Smoking status: Never Smoker  . Smokeless tobacco: Never Used  . Alcohol use 0.0 oz/week     Comment: "mixed drink once a year"     Allergies   Codeine; Erythromycin; Lopid [gemfibrozil]; Pravastatin; Simvastatin; and Sulfa antibiotics   Review of Systems Review of Systems  HENT: Positive for facial swelling.   Eyes: Negative for pain and visual disturbance.  Respiratory: Negative for shortness of breath.   Cardiovascular: Negative for chest pain.  Gastrointestinal: Negative for vomiting.  Musculoskeletal: Negative for neck pain.  Skin: Positive for color change and wound.  Neurological: Negative for dizziness and syncope.  All other systems reviewed and are negative.    Physical Exam Updated Vital Signs BP 143/82 (BP Location: Left Arm)   Pulse 70   Temp 97.5 F (36.4 C) (Oral)   Resp 18   SpO2 92%   Physical Exam  Constitutional: She is oriented to person, place, and time. She appears well-developed and well-nourished. No distress.  HENT:  Head: Normocephalic.  Nose: No sinus tenderness or nasal septal hematoma. No epistaxis.  Severe swelling  of superior inferior orbit with bruising. No crepitus.  No signs of entrapment.  Eyes: Conjunctivae are normal.  Medial scleral hemorrhage. No hyphema.  Neck:  No cervical midline tenderness   Cardiovascular: Normal rate, regular rhythm and normal heart sounds.   Pulses:      Radial pulses are 2+ on the right side, and 2+ on the left side.  Pulmonary/Chest: Effort normal and breath sounds normal.  Abdominal: She exhibits no distension.  Musculoskeletal: She exhibits no tenderness.  No bony tenderness of the bilateral hands. Normal ROM  Neurological: She is alert and oriented to person, place, and time.  Mental Status:   AOx3.  Speech clear without dysarthria. Cranial Nerves:  I-not tested  II-PERRLA  III, IV, VI-EOMs intact  V-temporal and masseter strength intact  VII-symmetrical facial movements intact, no facial droop  VIII-hearing grossly intact bilaterally  IX, X-gag intact  XI-strength of sternomastoid and trapezius muscles 5/5  XII-tongue midline Motor:   Good muscle bulk and tone  Strength 5/5 bilaterally in upper and lower extremities   Cerebellar--intact RAMs, finger to nose intact bilaterally.  Gait normal  No pronator drift Sensory:  Intact in upper and lower extremities   Skin: Skin is warm and dry.  Bilateral abrasions to greater thenar eminence.   Psychiatric: She has a normal mood and affect.  Nursing note and vitals reviewed.   ED Treatments / Results  DIAGNOSTIC STUDIES:  Oxygen Saturation is 92% on RA, adequate by my interpretation.    COORDINATION OF CARE:  1:06 PM Discussed treatment plan with pt at bedside and pt agreed to plan.  Labs (all labs ordered are listed, but only abnormal results are displayed) Labs Reviewed - No data to display  EKG  EKG Interpretation None       Radiology Ct Head Wo Contrast  Result Date: 11/22/2015 CLINICAL DATA:  Fall with injury to head and left orbit. Initial encounter. EXAM: CT HEAD WITHOUT  CONTRAST CT MAXILLOFACIAL WITHOUT CONTRAST CT CERVICAL SPINE WITHOUT CONTRAST TECHNIQUE: Multidetector CT imaging of the head, cervical spine, and maxillofacial structures were performed using the standard protocol without intravenous contrast. Multiplanar CT image reconstructions of the cervical spine and maxillofacial structures were also generated. COMPARISON:  CT of the head on 09/08/2005 FINDINGS: CT HEAD FINDINGS Brain: Stable periventricular small vessel disease. The brain demonstrates no evidence of hemorrhage, acute infarction, edema, mass effect, extra-axial fluid collection, hydrocephalus or mass lesion. Vascular: No hyperdense vessel or unexpected calcification. Skull: Normal. Negative for fracture or focal lesion. Other: None. CT MAXILLOFACIAL FINDINGS Osseous: No fracture or mandibular dislocation. No destructive process. No bony lesions. Orbits: Left periorbital subcutaneous hemorrhage identified. No evidence of underlying fracture or intraorbital hemorrhage. The globe is intact. No evidence of blowout fracture. Sinuses: Air-fluid level present in the left maxillary antrum. There is a mucosal thickening and fluid in a left-sided sphenoid air cell. Soft tissues: No soft tissue foreign body identified. CT CERVICAL SPINE FINDINGS Alignment: Normal. Skull base and vertebrae: No acute fracture. No primary bone lesion or focal pathologic process. Soft tissues and spinal canal: No prevertebral fluid or swelling. No visible canal hematoma. Disc levels: Prior cervical fusion across the C3-4, C4-5, C5-6 and C6-7 levels. Bony fusion appears solid. Anterior fusion plate and screws appear well positioned with no evidence of hardware failure or abnormal lucency. Upper chest: Port catheter partially visualized extending into the right internal jugular vein. Incidental thyroid enlargement with heterogeneous appearance of both lobes of the thyroid gland. The left lobe is slightly larger than the right. No mass effect  on the trachea. IMPRESSION:  1. Stable small vessel disease of the brain without acute head injury. 2. Subcutaneous hemorrhage surrounding the left orbit without evidence of underlying acute fracture. 3. Air-fluid level in left maxillary antrum and mucosal thickening/fluid in a left sphenoid air cell. 4. No evidence of acute cervical injury. Solid bony fusion at previous fused cervical levels spanning from the C3 level to the C7 level. Electronically Signed   By: Aletta Edouard M.D.   On: 11/22/2015 14:00   Ct Cervical Spine Wo Contrast  Result Date: 11/22/2015 CLINICAL DATA:  Fall with injury to head and left orbit. Initial encounter. EXAM: CT HEAD WITHOUT CONTRAST CT MAXILLOFACIAL WITHOUT CONTRAST CT CERVICAL SPINE WITHOUT CONTRAST TECHNIQUE: Multidetector CT imaging of the head, cervical spine, and maxillofacial structures were performed using the standard protocol without intravenous contrast. Multiplanar CT image reconstructions of the cervical spine and maxillofacial structures were also generated. COMPARISON:  CT of the head on 09/08/2005 FINDINGS: CT HEAD FINDINGS Brain: Stable periventricular small vessel disease. The brain demonstrates no evidence of hemorrhage, acute infarction, edema, mass effect, extra-axial fluid collection, hydrocephalus or mass lesion. Vascular: No hyperdense vessel or unexpected calcification. Skull: Normal. Negative for fracture or focal lesion. Other: None. CT MAXILLOFACIAL FINDINGS Osseous: No fracture or mandibular dislocation. No destructive process. No bony lesions. Orbits: Left periorbital subcutaneous hemorrhage identified. No evidence of underlying fracture or intraorbital hemorrhage. The globe is intact. No evidence of blowout fracture. Sinuses: Air-fluid level present in the left maxillary antrum. There is a mucosal thickening and fluid in a left-sided sphenoid air cell. Soft tissues: No soft tissue foreign body identified. CT CERVICAL SPINE FINDINGS Alignment:  Normal. Skull base and vertebrae: No acute fracture. No primary bone lesion or focal pathologic process. Soft tissues and spinal canal: No prevertebral fluid or swelling. No visible canal hematoma. Disc levels: Prior cervical fusion across the C3-4, C4-5, C5-6 and C6-7 levels. Bony fusion appears solid. Anterior fusion plate and screws appear well positioned with no evidence of hardware failure or abnormal lucency. Upper chest: Port catheter partially visualized extending into the right internal jugular vein. Incidental thyroid enlargement with heterogeneous appearance of both lobes of the thyroid gland. The left lobe is slightly larger than the right. No mass effect on the trachea. IMPRESSION: 1. Stable small vessel disease of the brain without acute head injury. 2. Subcutaneous hemorrhage surrounding the left orbit without evidence of underlying acute fracture. 3. Air-fluid level in left maxillary antrum and mucosal thickening/fluid in a left sphenoid air cell. 4. No evidence of acute cervical injury. Solid bony fusion at previous fused cervical levels spanning from the C3 level to the C7 level. Electronically Signed   By: Aletta Edouard M.D.   On: 11/22/2015 14:00   Ct Maxillofacial Wo Contrast  Result Date: 11/22/2015 CLINICAL DATA:  Fall with injury to head and left orbit. Initial encounter. EXAM: CT HEAD WITHOUT CONTRAST CT MAXILLOFACIAL WITHOUT CONTRAST CT CERVICAL SPINE WITHOUT CONTRAST TECHNIQUE: Multidetector CT imaging of the head, cervical spine, and maxillofacial structures were performed using the standard protocol without intravenous contrast. Multiplanar CT image reconstructions of the cervical spine and maxillofacial structures were also generated. COMPARISON:  CT of the head on 09/08/2005 FINDINGS: CT HEAD FINDINGS Brain: Stable periventricular small vessel disease. The brain demonstrates no evidence of hemorrhage, acute infarction, edema, mass effect, extra-axial fluid collection,  hydrocephalus or mass lesion. Vascular: No hyperdense vessel or unexpected calcification. Skull: Normal. Negative for fracture or focal lesion. Other: None. CT MAXILLOFACIAL FINDINGS Osseous: No fracture or mandibular  dislocation. No destructive process. No bony lesions. Orbits: Left periorbital subcutaneous hemorrhage identified. No evidence of underlying fracture or intraorbital hemorrhage. The globe is intact. No evidence of blowout fracture. Sinuses: Air-fluid level present in the left maxillary antrum. There is a mucosal thickening and fluid in a left-sided sphenoid air cell. Soft tissues: No soft tissue foreign body identified. CT CERVICAL SPINE FINDINGS Alignment: Normal. Skull base and vertebrae: No acute fracture. No primary bone lesion or focal pathologic process. Soft tissues and spinal canal: No prevertebral fluid or swelling. No visible canal hematoma. Disc levels: Prior cervical fusion across the C3-4, C4-5, C5-6 and C6-7 levels. Bony fusion appears solid. Anterior fusion plate and screws appear well positioned with no evidence of hardware failure or abnormal lucency. Upper chest: Port catheter partially visualized extending into the right internal jugular vein. Incidental thyroid enlargement with heterogeneous appearance of both lobes of the thyroid gland. The left lobe is slightly larger than the right. No mass effect on the trachea. IMPRESSION: 1. Stable small vessel disease of the brain without acute head injury. 2. Subcutaneous hemorrhage surrounding the left orbit without evidence of underlying acute fracture. 3. Air-fluid level in left maxillary antrum and mucosal thickening/fluid in a left sphenoid air cell. 4. No evidence of acute cervical injury. Solid bony fusion at previous fused cervical levels spanning from the C3 level to the C7 level. Electronically Signed   By: Aletta Edouard M.D.   On: 11/22/2015 14:00    Procedures Procedures (including critical care time)  Medications Ordered  in ED Medications  bacitracin ointment (1 application Topical Given 11/22/15 1402)  Tdap (BOOSTRIX) injection 0.5 mL (0.5 mLs Intramuscular Given 11/22/15 1402)  HYDROcodone-acetaminophen (NORCO/VICODIN) 5-325 MG per tablet 1 tablet (1 tablet Oral Given 11/22/15 1402)     Initial Impression / Assessment and Plan / ED Course  I have reviewed the triage vital signs and the nursing notes.  Pertinent labs & imaging results that were available during my care of the patient were reviewed by me and considered in my medical decision making (see chart for details).  Clinical Course    Patient presents with mechanical fall just PTA.  Tetanus up dated.  Abrasions cleaned and bacitracin applied. Initial concern for orbital fracture and CTs of c-spine, head, and face negative.  No visual disturbance or evidence of hyphema.  No focal neurological deficits. Discussed symptomatic care with ice and head elevation. Patient has hydrocodone at home for pain.Return precautions discussed. Stable for discharge.  Case has been discussed with and seen by Dr. Jeneen Rinks who agrees with the above plan for discharge.    Final Clinical Impressions(s) / ED Diagnoses   Final diagnoses:  Scleral hemorrhage of left eye  Contusion of face, initial encounter  Abrasions of multiple sites    New Prescriptions New Prescriptions   No medications on file   I personally performed the services described in this documentation, which was scribed in my presence. The recorded information has been reviewed and is accurate.     Gloriann Loan, PA-C 11/22/15 1414    Gloriann Loan, PA-C 11/22/15 1415    Tanna Furry, MD 12/10/15 1028

## 2015-11-22 NOTE — ED Notes (Signed)
Patient d/c'd self care.  F/U and medications reviewed.  Patient verbalized understanding. 

## 2015-11-22 NOTE — ED Notes (Signed)
Patient transported to CT 

## 2015-11-22 NOTE — ED Provider Notes (Signed)
Patient seen and evaluated. Discussed with Gloriann Loan PA-C. Has circumferential left periorbital ecchymosis. Has near circumferential subconjunctival hemorrhage. No hyphema. Reports her vision is normal. Normal sensation to face. Normal eye movement. No malocclusion or dental trauma. Imaging normal. Plan discharge home. Discussed slow resolution of swelling and discoloration. Plan pain control and expectant management.    Tanna Furry, MD 11/22/15 [phone]

## 2015-11-22 NOTE — ED Triage Notes (Signed)
Per pt, states she tripped going in sore-hematoma, bruising to left eye-no LOC

## 2015-11-22 NOTE — ED Notes (Signed)
Bed: WTR7 Expected date:  Expected time:  Means of arrival:  Comments: 

## 2015-12-05 ENCOUNTER — Other Ambulatory Visit: Payer: Self-pay | Admitting: Cardiology

## 2015-12-05 ENCOUNTER — Other Ambulatory Visit: Payer: Self-pay | Admitting: Physician Assistant

## 2015-12-08 NOTE — Telephone Encounter (Signed)
Rx request sent to pharmacy.  

## 2016-01-12 DIAGNOSIS — J189 Pneumonia, unspecified organism: Secondary | ICD-10-CM

## 2016-01-12 HISTORY — DX: Pneumonia, unspecified organism: J18.9

## 2016-02-23 ENCOUNTER — Other Ambulatory Visit: Payer: Self-pay | Admitting: Cardiology

## 2016-02-24 NOTE — Telephone Encounter (Signed)
Rx(s) sent to pharmacy electronically.  

## 2016-06-09 DIAGNOSIS — K219 Gastro-esophageal reflux disease without esophagitis: Secondary | ICD-10-CM | POA: Insufficient documentation

## 2016-06-10 ENCOUNTER — Other Ambulatory Visit: Payer: Self-pay | Admitting: Cardiology

## 2016-06-10 NOTE — Telephone Encounter (Signed)
REFILL 

## 2016-06-18 ENCOUNTER — Ambulatory Visit (INDEPENDENT_AMBULATORY_CARE_PROVIDER_SITE_OTHER): Payer: Medicare Other | Admitting: Cardiology

## 2016-06-18 ENCOUNTER — Encounter: Payer: Self-pay | Admitting: Cardiology

## 2016-06-18 VITALS — BP 118/60 | HR 69 | Ht 61.0 in | Wt 152.4 lb

## 2016-06-18 DIAGNOSIS — I25708 Atherosclerosis of coronary artery bypass graft(s), unspecified, with other forms of angina pectoris: Secondary | ICD-10-CM | POA: Diagnosis not present

## 2016-06-18 DIAGNOSIS — I10 Essential (primary) hypertension: Secondary | ICD-10-CM

## 2016-06-18 DIAGNOSIS — Z951 Presence of aortocoronary bypass graft: Secondary | ICD-10-CM

## 2016-06-18 DIAGNOSIS — I2510 Atherosclerotic heart disease of native coronary artery without angina pectoris: Secondary | ICD-10-CM | POA: Diagnosis not present

## 2016-06-18 DIAGNOSIS — E785 Hyperlipidemia, unspecified: Secondary | ICD-10-CM | POA: Diagnosis not present

## 2016-06-18 NOTE — Progress Notes (Signed)
PCP: [facility]  Clinic Note: Chief Complaint  Patient presents with  . Chest Pain    pt states some this morning but thinks it was just indigetion     HPI: Dana Jackson is a 71 y.o. female who is being seen today for 9 month follow-up for CAD-CABG and carotid disease. She has been treated at [facility] for endometrial cancer status post TAH/BSO followed by chemotherapy and radiation - now on maintenance Rx. They have reduced the intervals for her follow-up checks now. She has had a history of a TIA as well.  Dana Jackson was last seen on 09/26/2015  Recent Hospitalizations: None  Studies Personally Reviewed - (if available, images/films reviewed: From Epic Chart or Care Everywhere)  none  Interval History: Dana Jackson presents today very happy and in good mood. She has not had any further anginal chest discomfort or dyspnea. She was up all night long coughing after choking him on some food last night. This morning she woke up with discomfort in her chest but not her anginal symptoms. Now that she is finishing up her treatments for her various cancers, she is gradually trying to get back into exercising doing more walking. She has a FitBit the tracks or steps. She is trying to get up to close to 7000 steps, but currently is not getting too much more than 04-4998 steps. She does still have issues with fatigue, but has not had any resting or exertional chest tightness or pressure. No PND, orthopnea or edema. No lightheadedness, dizziness, wooziness or syncope/near syncope. No TIA or amaurosis fugax symptoms.  No claudication.  ROS: A comprehensive was performed. Review of Systems  Constitutional: Negative for malaise/fatigue.  HENT: Positive for congestion. Negative for nosebleeds.   Respiratory: Positive for cough (Apparently she choked on something ). Negative for shortness of breath and wheezing.   Gastrointestinal: Negative for blood in stool  and melena.  Genitourinary: Negative for hematuria.  Musculoskeletal: Positive for joint pain. Negative for falls.  Neurological: Negative for dizziness, seizures and loss of consciousness.  Endo/Heme/Allergies: Negative for environmental allergies.  Psychiatric/Behavioral: Negative for depression and memory loss. The patient has insomnia.   All other systems reviewed and are negative.  I have reviewed and (if needed) personally updated the patient's problem list, medications, allergies, past medical and surgical history, social and family history.   Past Medical History:  Diagnosis Date  . Arthritis   . Asthma    childhood  . Bacteremia due to Escherichia coli June 2015    Currently being treated with anti-biotics  . CAD in native artery 12/2010   a) Cath for exertional angina & EKG changes: 40% LM, 80% mid LAD.  95% ost cX, 80-90% PDA --> CABG X3; b) Post CABG CATH for + Myoview with basal anterior ischemia -> Ost LAD 80% (diffuse) then 100% after SP2, RI 70% (too small for PCI), Ost-prox Cx 99% & OM1 90%, OstrPDA 80% (small); Occluded SVG-rPDA.  Patent LIMA-dLAD, SVG-OM: culprit ~ p-m LAD pre-LIMA & RI - not good PCI target --> Med Rx  . CKD (chronic kidney disease) stage 3, GFR 30-59 ml/min   . Degenerative disc disease, cervical    Degenerative disc disease, cervical [722.4]  . Endometrial cancer [facility]) November 2015   Treated with TAH with pelvic lymphadenectomy followed by radiation and chemotherapy  . GERD (gastroesophageal reflux disease)   . History of asthma     childhood  . History of pneumonia    "  2-3 times"  . History of unstable angina November/December 2012   T wave inversions in inferolateral leads.  No stress test performed.  . Hyperlipidemia LDL goal <70   . Hypertension, essential, benign   . Pneumonia    walking  . Renal cell carcinoma ([place]) 11/18/2008   T2aNx s/p partial left nephrectomy  . 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  Stroke [facility])    TIA  . TIA (transient ischemic attack) 1992 &  2010    Past Surgical History:  Procedure Laterality Date  . ANTERIOR CERVICAL DECOMP/DISCECTOMY FUSION  10/11/2005   multi-level  . CARDIAC CATHETERIZATION  12/24/10   40% left main, 80% mid LAD, 95% ostial circumflex, 80-90% PDA.  [name] CARDIAC CATHETERIZATION N/A 07/01/2014   Procedure: Left Heart Cath and Cors/Grafts Angiography;  Surgeon: Leonie Man, MD;  Location: [facility] INVASIVE CV LAB:  For Abn Nuc @ [facility]: Ost LAD 80%, mid LAD 100% after S/P 2, 70% RI (no PCI target), ost-prox Cx 99%, OM1 90%. Ost rPDA 80% (~ pre-CABG), occluded SVG-rPDA, patent LIMA-dLAD, SVG OM; potential culprit for abn Nuc scan = pLAD Dz, small RI 70% or PDA -> med Rx  . CAROTID ENDARTERECTOMY Left   . CHOLECYSTECTOMY  ~ 1971  . COLONOSCOPY    . CORONARY ARTERY BYPASS GRAFT  12/25/2010   Procedure: CORONARY ARTERY BYPASS GRAFTING (CABGX3 - LIMA-LAD, SVG RPL, SVG-Circumflex);  Surgeon: Rexene Alberts, MD;  Location: [place];  Service: Open Heart Surgery;  Laterality: N/A;  . DILATATION & CURETTAGE/HYSTEROSCOPY WITH MYOSURE N/A 11/02/2013   Procedure: DILATATION & CURETTAGE/HYSTEROSCOPY WITH MYOSURE ABLATION;  Surgeon: Allena Katz, MD;  Location: [facility];  Service: Gynecology;  Laterality: N/A;  . DOPPLER ECHOCARDIOGRAPHY  12/08/2010   EF =>55%,MILD CONCENTRIC LEFT VENTRICULAR HYPERTROPHY  . LEFT HEART CATHETERIZATION WITH CORONARY ANGIOGRAM N/A 12/24/2010   Procedure: LEFT HEART CATHETERIZATION WITH CORONARY ANGIOGRAM;  Surgeon: Leonie Man, MD;  Location: [facility] CATH LAB;  Service: Cardiovascular;  Laterality: N/A;  . NM MYOCAR SINGLE W/SPECT  07/26/2007   EF 79%, LEFT VENT.FUNCTION NORMAL  . PARTIAL NEPHRECTOMY Left 11/18/2008   left partial nephrectomy for renal cell CA  . PET Myocardial Perfusion Scan  06/13/2014   At [place]: Moderate size, mild severity completely reversible defect involving the basal anterior, mid anterior and apical  anterior segments consistent with ischemia. EF 65% with normal global function.  . TONSILLECTOMY     "in college"  . TOTAL ABDOMINAL HYSTERECTOMY  November 2015    At [facility]: Robotic procedure with pelvic lymphadenectomy  . TRANSTHORACIC ECHOCARDIOGRAM  06/13/2014   At [place]: EF 60-65%. Grade 1 diastolic dysfunction. Mild MR. Aortic sclerosis. Moderate pulmonary hypertension  . TUBAL LIGATION  ~ 1984  . UPPER GI ENDOSCOPY    . WISDOM TOOTH EXTRACTION      Current Meds  Medication Sig  . Alpha-Lipoic Acid 600 MG CAPS Take 600 mg by mouth daily.  [name] aspirin 81 MG tablet Take 81 mg by mouth daily.    [name] atorvastatin (LIPITOR) 40 MG tablet Take 40 mg by mouth at bedtime.   . calcitRIOL (ROCALTROL) 0.25 MCG capsule Take 0.25 mcg by mouth daily.  [name] docusate sodium (COLACE) 100 MG capsule Take 100 mg by mouth daily as needed for mild constipation.  . gabapentin (NEURONTIN) 100 MG capsule Take 100 mg by mouth 3 (three) times daily.  [name] glipiZIDE (GLUCOTROL) 5 MG tablet  Take 2.5 mg by mouth 2 (two) times daily as needed (only take in the morning if BS is above 75 and always take evening dose).   . isosorbide mononitrate (IMDUR) 30 MG 24 hr tablet TAKE 1 TABLET DAILY  . loperamide (IMODIUM) 2 MG capsule Take 2 mg by mouth 4 (four) times daily as needed for diarrhea or loose stools.  [name] loratadine (CLARITIN) 10 MG tablet Take 10 mg by mouth daily.  . magnesium oxide (MAG-OX) 400 MG tablet Take 400 mg by mouth daily.  . metoprolol (LOPRESSOR) 50 MG tablet TAKE 1 TABLET BY MOUTH TWICE A DAY  . nitroGLYCERIN (NITROSTAT) 0.4 MG SL tablet PLACE 1 TABLET (0.4 MG TOTAL) UNDER THE TONGUE EVERY 5 (FIVE) MINUTES AS NEEDED FOR CHEST PAIN.  [name] omeprazole (PRILOSEC) 40 MG capsule Take 40 mg by mouth daily.    . ondansetron (ZOFRAN) 4 MG tablet Take 4 mg by mouth as directed.   . Probiotic Product (ALIGN PO) Take 1 each by mouth daily.  . prochlorperazine (COMPAZINE) 10 MG tablet Take 10 mg by  mouth every 6 (six) hours as needed for nausea or vomiting.  . promethazine (PHENERGAN) 25 MG tablet Take 25 mg by mouth as directed.   . Pyridoxine HCl (VITAMIN B-6 PO) Take 2 tablets by mouth daily.  . ranitidine (ZANTAC) 300 MG tablet Take 1 tablet by mouth at bedtime.    Allergies  Allergen Reactions  . Codeine Nausea Only  . Erythromycin Itching  . Lopid [Gemfibrozil] Itching  . Pravastatin Other (See Comments)    Used 01/2011 to 03/2011  . Simvastatin Other (See Comments)    Used 07/2007 to 11/2010 chg to pravastatin  . Sulfa Antibiotics     aki     Social History   Social History  . Marital status: Married    Spouse name: [name]  . Number of children: 2  . Years of education: many   Occupational History  . 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  Retired   Social History Main Topics  . Smoking status: Never Smoker  . Smokeless tobacco: Never Used  . Alcohol use 0.0 oz/week     Comment: "mixed drink once a year"  . Drug use: No  . Sexual activity: Yes    Partners: Male     Comment: husband   Other Topics Concern  . None   Social History Narrative   She is a married Mother of 2.  -- Currently being very busy taking care of 2 foster children that are staying with her daughter.  A 41-year-old and a 25-year-old that they're trying to adopt.  She is very excited about the possibility of becoming a Grandmother.   She does walk and getting exercise, but she is wanting to get back into more activities just because she has really been limited due to her arthritic pains. She used to do things like walking and biking, and she may try to do some biking again, or at least stationary biking.    Does not smoke, does not drink.    family history includes Coronary artery disease in her brother, father, and sister; Stroke in her maternal grandmother.  Wt Readings from Last 3 Encounters:  06/18/16 152 lb 6.4 oz (69.1 kg)  09/26/15 156 lb (70.8 kg)    03/06/15 155 lb 6.4 oz (70.5 kg)    PHYSICAL EXAM BP 118/60   Pulse 69   Ht 5\' 1"  (1.549  m)   Wt 152 lb 6.4 oz (69.1 kg)   BMI 28.80 kg/m  General appearance: alert, cooperative, appears stated age, no distress. Mildly overweight. Otherwise well-nourished well-groomed. HEENT: [place]/AT, EOMI, MMM, anicteric sclera Neck: no adenopathy, no carotid bruit and no JVD Lungs: clear to auscultation bilaterally, normal percussion bilaterally and non-labored Heart: RRR. Normal S1 with physiologic split S2. Soft 1-2/6 SEM at RUSB. No other M/R/G.PMI nondisplaced Abdomen: soft, non-tender; bowel sounds normal; no masses,  no organomegaly; no HJR Extremities: extremities normal, atraumatic, no cyanosis; edema - trivial Pulses: 2+ and symmetric; none Skin: mobility and turgor normal; no notable rash or lesions.  Neurologic: Mental status: Alert & oriented x 3, thought content appropriate; non-focal exam.  Pleasant mood & affect.    Adult ECG Report  Rate: 69 ;  Rhythm: normal sinus rhythm and Nonspecific ST and T-wave changes.;   Narrative Interpretation: Otherwise normal EKG   Other studies Reviewed: Additional studies/ records that were reviewed today include:  Recent Labs:   Lab Results  Component Value Date   CREATININE 2.90 (H) 09/30/2015   BUN 49 (H) 09/30/2015   NA 143 09/30/2015   K 5.3 (H) 09/30/2015   CL 105 09/30/2015   CO2 22 09/30/2015   Lab Results  Component Value Date   CHOL 197 09/30/2015   HDL 29 (L) 09/30/2015   LDLCALC 102 (H) 09/30/2015   TRIG 331 (H) 09/30/2015   CHOLHDL 2.5 12/24/2010  - Followed by PCP  ASSESSMENT / PLAN:  Problem List Items Addressed This Visit    CAD (coronary artery disease) of artery bypass graft (Chronic)    Known occluded vein graft to the right. Doing well with no active symptoms. On stable regimen.      Coronary artery disease involving native coronary artery without angina pectoris - Primary (Chronic)    Post-Bypass  catheterization, she has an occluded vein graft to the right  -> medical management.  Currently doing well. No anginal symptoms on beta blocker and Imdur. She is on aspirin and statin. Trying to be more active. We will monitor for further symptoms, and only or Myoview test for symptoms.        Relevant Orders   EKG 12-Lead   Essential hypertension (Chronic)    Well controlled on current medications. No change      Hyperlipidemia LDL goal <70 (Chronic)    On atorvastatin. Last followed by PCP. We'll need to ask for labs to be sent for evaluation and to update the chart.      S/P CABG x 3 (Chronic)    No plans for follow-up Myoview unless symptoms occur.      Relevant Orders   EKG 12-Lead      Current medicines are reviewed at length with the patient today. (+/- concerns) none The following changes have been made: none  Patient Instructions  NO CHANGE IN CURRENT MEDICATION.    Your physician wants you to follow-up in [place]. You will receive a reminder letter in the mail two months in advance. If you don't receive a letter, please call our office to schedule the follow-up appointment.    Studies Ordered:   Orders Placed This Encounter  Procedures  . EKG 12-Lead      Glenetta Hew, M.D., M.S. Interventional Cardiologist   Pager # [phone] Phone # [phone] [address] [place],  [address]

## 2016-06-18 NOTE — Patient Instructions (Signed)
NO CHANGE IN CURRENT MEDICATION.    Your physician wants you to follow-up in [place]. You will receive a reminder letter in the mail two months in advance. If you don't receive a letter, please call our office to schedule the follow-up appointment.

## 2016-06-19 NOTE — Assessment & Plan Note (Addendum)
Post-Bypass catheterization, she has an occluded vein graft to the right  -> medical management.  Currently doing well. No anginal symptoms on beta blocker and Imdur. She is on aspirin and statin. Trying to be more active. We will monitor for further symptoms, and only or Myoview test for symptoms.

## 2016-06-19 NOTE — Assessment & Plan Note (Signed)
Known occluded vein graft to the right. Doing well with no active symptoms. On stable regimen.

## 2016-06-19 NOTE — Assessment & Plan Note (Addendum)
Well controlled on current medications. No change

## 2016-06-19 NOTE — Assessment & Plan Note (Signed)
No plans for follow-up Myoview unless symptoms occur.

## 2016-06-19 NOTE — Assessment & Plan Note (Addendum)
On atorvastatin. Last followed by PCP. We'll need to ask for labs to be sent for evaluation and to update the chart.

## 2016-08-25 ENCOUNTER — Ambulatory Visit (HOSPITAL_COMMUNITY)
Admission: RE | Admit: 2016-08-25 | Discharge: 2016-08-25 | Disposition: A | Discharge status: Home / Self Care | Payer: Medicare Other | Source: Ambulatory Visit | Attending: Urology | Admitting: Urology

## 2016-08-25 ENCOUNTER — Other Ambulatory Visit (HOSPITAL_COMMUNITY): Payer: Self-pay | Admitting: Urology

## 2016-08-25 DIAGNOSIS — Z8553 Personal history of malignant neoplasm of renal pelvis: Secondary | ICD-10-CM | POA: Diagnosis not present

## 2016-08-25 DIAGNOSIS — Z951 Presence of aortocoronary bypass graft: Secondary | ICD-10-CM | POA: Insufficient documentation

## 2016-09-08 ENCOUNTER — Other Ambulatory Visit: Payer: Self-pay | Admitting: Cardiology

## 2016-09-09 ENCOUNTER — Other Ambulatory Visit: Payer: Self-pay | Admitting: Cardiology

## 2016-12-08 ENCOUNTER — Other Ambulatory Visit: Payer: Self-pay | Admitting: Cardiology

## 2016-12-22 ENCOUNTER — Telehealth: Payer: Self-pay | Admitting: *Deleted

## 2016-12-22 DIAGNOSIS — Z79899 Other long term (current) drug therapy: Secondary | ICD-10-CM

## 2016-12-22 DIAGNOSIS — E785 Hyperlipidemia, unspecified: Secondary | ICD-10-CM

## 2016-12-22 DIAGNOSIS — I25708 Atherosclerosis of coronary artery bypass graft(s), unspecified, with other forms of angina pectoris: Secondary | ICD-10-CM

## 2016-12-22 NOTE — Telephone Encounter (Signed)
Mailed letter and labslip 

## 2016-12-22 NOTE — Telephone Encounter (Addendum)
-----   Message from Raiford Simmonds, RN sent at 10/20/2015  4:04 PM EDT ----- [facility] ,LIPID IN DEC 2018  DUE 01/19/2017

## 2017-05-09 ENCOUNTER — Emergency Department (HOSPITAL_COMMUNITY): Payer: Medicare Other

## 2017-05-09 ENCOUNTER — Encounter (HOSPITAL_COMMUNITY): Payer: Self-pay

## 2017-05-09 ENCOUNTER — Inpatient Hospital Stay (HOSPITAL_COMMUNITY)
Admission: EM | Admit: 2017-05-09 | Discharge: 2017-05-13 | DRG: 871 | Disposition: A | Discharge status: Home / Self Care | Payer: Medicare Other | Attending: Internal Medicine | Admitting: Internal Medicine

## 2017-05-09 ENCOUNTER — Other Ambulatory Visit: Payer: Self-pay

## 2017-05-09 DIAGNOSIS — E785 Hyperlipidemia, unspecified: Secondary | ICD-10-CM | POA: Diagnosis present

## 2017-05-09 DIAGNOSIS — Z823 Family history of stroke: Secondary | ICD-10-CM

## 2017-05-09 DIAGNOSIS — Z881 Allergy status to other antibiotic agents status: Secondary | ICD-10-CM

## 2017-05-09 DIAGNOSIS — Z951 Presence of aortocoronary bypass graft: Secondary | ICD-10-CM

## 2017-05-09 DIAGNOSIS — Z9221 Personal history of antineoplastic chemotherapy: Secondary | ICD-10-CM | POA: Diagnosis not present

## 2017-05-09 DIAGNOSIS — J9601 Acute respiratory failure with hypoxia: Secondary | ICD-10-CM | POA: Diagnosis present

## 2017-05-09 DIAGNOSIS — Z885 Allergy status to narcotic agent status: Secondary | ICD-10-CM

## 2017-05-09 DIAGNOSIS — N184 Chronic kidney disease, stage 4 (severe): Secondary | ICD-10-CM | POA: Diagnosis present

## 2017-05-09 DIAGNOSIS — Z8542 Personal history of malignant neoplasm of other parts of uterus: Secondary | ICD-10-CM | POA: Diagnosis not present

## 2017-05-09 DIAGNOSIS — J181 Lobar pneumonia, unspecified organism: Secondary | ICD-10-CM | POA: Diagnosis present

## 2017-05-09 DIAGNOSIS — N182 Chronic kidney disease, stage 2 (mild): Secondary | ICD-10-CM | POA: Diagnosis not present

## 2017-05-09 DIAGNOSIS — J45909 Unspecified asthma, uncomplicated: Secondary | ICD-10-CM | POA: Diagnosis present

## 2017-05-09 DIAGNOSIS — E1129 Type 2 diabetes mellitus with other diabetic kidney complication: Secondary | ICD-10-CM | POA: Diagnosis present

## 2017-05-09 DIAGNOSIS — G9349 Other encephalopathy: Secondary | ICD-10-CM | POA: Diagnosis present

## 2017-05-09 DIAGNOSIS — N179 Acute kidney failure, unspecified: Secondary | ICD-10-CM | POA: Diagnosis present

## 2017-05-09 DIAGNOSIS — Z7984 Long term (current) use of oral hypoglycemic drugs: Secondary | ICD-10-CM

## 2017-05-09 DIAGNOSIS — N189 Chronic kidney disease, unspecified: Secondary | ICD-10-CM | POA: Diagnosis present

## 2017-05-09 DIAGNOSIS — Z882 Allergy status to sulfonamides status: Secondary | ICD-10-CM

## 2017-05-09 DIAGNOSIS — I10 Essential (primary) hypertension: Secondary | ICD-10-CM | POA: Diagnosis present

## 2017-05-09 DIAGNOSIS — K219 Gastro-esophageal reflux disease without esophagitis: Secondary | ICD-10-CM | POA: Diagnosis present

## 2017-05-09 DIAGNOSIS — J069 Acute upper respiratory infection, unspecified: Secondary | ICD-10-CM | POA: Diagnosis present

## 2017-05-09 DIAGNOSIS — E1122 Type 2 diabetes mellitus with diabetic chronic kidney disease: Secondary | ICD-10-CM | POA: Diagnosis present

## 2017-05-09 DIAGNOSIS — G9340 Encephalopathy, unspecified: Secondary | ICD-10-CM | POA: Diagnosis present

## 2017-05-09 DIAGNOSIS — Z79899 Other long term (current) drug therapy: Secondary | ICD-10-CM

## 2017-05-09 DIAGNOSIS — R0902 Hypoxemia: Secondary | ICD-10-CM | POA: Diagnosis not present

## 2017-05-09 DIAGNOSIS — J189 Pneumonia, unspecified organism: Secondary | ICD-10-CM | POA: Diagnosis present

## 2017-05-09 DIAGNOSIS — Z8249 Family history of ischemic heart disease and other diseases of the circulatory system: Secondary | ICD-10-CM

## 2017-05-09 DIAGNOSIS — Z8701 Personal history of pneumonia (recurrent): Secondary | ICD-10-CM

## 2017-05-09 DIAGNOSIS — D631 Anemia in chronic kidney disease: Secondary | ICD-10-CM | POA: Diagnosis present

## 2017-05-09 DIAGNOSIS — Z923 Personal history of irradiation: Secondary | ICD-10-CM | POA: Diagnosis not present

## 2017-05-09 DIAGNOSIS — E860 Dehydration: Secondary | ICD-10-CM | POA: Diagnosis present

## 2017-05-09 DIAGNOSIS — Z981 Arthrodesis status: Secondary | ICD-10-CM | POA: Diagnosis not present

## 2017-05-09 DIAGNOSIS — Z888 Allergy status to other drugs, medicaments and biological substances status: Secondary | ICD-10-CM

## 2017-05-09 DIAGNOSIS — I25810 Atherosclerosis of coronary artery bypass graft(s) without angina pectoris: Secondary | ICD-10-CM | POA: Diagnosis present

## 2017-05-09 DIAGNOSIS — Z8673 Personal history of transient ischemic attack (TIA), and cerebral infarction without residual deficits: Secondary | ICD-10-CM | POA: Diagnosis not present

## 2017-05-09 DIAGNOSIS — Z85528 Personal history of other malignant neoplasm of kidney: Secondary | ICD-10-CM

## 2017-05-09 DIAGNOSIS — A419 Sepsis, unspecified organism: Secondary | ICD-10-CM | POA: Diagnosis present

## 2017-05-09 DIAGNOSIS — Z905 Acquired absence of kidney: Secondary | ICD-10-CM | POA: Diagnosis not present

## 2017-05-09 DIAGNOSIS — I25708 Atherosclerosis of coronary artery bypass graft(s), unspecified, with other forms of angina pectoris: Secondary | ICD-10-CM | POA: Diagnosis not present

## 2017-05-09 DIAGNOSIS — D649 Anemia, unspecified: Secondary | ICD-10-CM

## 2017-05-09 DIAGNOSIS — E1169 Type 2 diabetes mellitus with other specified complication: Secondary | ICD-10-CM | POA: Diagnosis present

## 2017-05-09 DIAGNOSIS — I129 Hypertensive chronic kidney disease with stage 1 through stage 4 chronic kidney disease, or unspecified chronic kidney disease: Secondary | ICD-10-CM | POA: Diagnosis present

## 2017-05-09 DIAGNOSIS — Z7982 Long term (current) use of aspirin: Secondary | ICD-10-CM

## 2017-05-09 LAB — CBC WITH DIFFERENTIAL/PLATELET
Basophils Absolute: 0 10*3/uL (ref 0.0–0.1)
Basophils Relative: 0 %
Eosinophils Absolute: 0.2 10*3/uL (ref 0.0–0.7)
Eosinophils Relative: 1 %
HCT: 29.9 % — ABNORMAL LOW (ref 36.0–46.0)
Hemoglobin: 9.8 g/dL — ABNORMAL LOW (ref 12.0–15.0)
Lymphocytes Relative: 8 %
Lymphs Abs: 1.3 10*3/uL (ref 0.7–4.0)
MCH: 30.1 pg (ref 26.0–34.0)
MCHC: 32.8 g/dL (ref 30.0–36.0)
MCV: 91.7 fL (ref 78.0–100.0)
Monocytes Absolute: 1.5 10*3/uL — ABNORMAL HIGH (ref 0.1–1.0)
Monocytes Relative: 9 %
Neutro Abs: 13.6 10*3/uL — ABNORMAL HIGH (ref 1.7–7.7)
Neutrophils Relative %: 82 %
Platelets: 264 10*3/uL (ref 150–400)
RBC: 3.26 MIL/uL — ABNORMAL LOW (ref 3.87–5.11)
RDW: 13.6 % (ref 11.5–15.5)
WBC: 16.6 10*3/uL — ABNORMAL HIGH (ref 4.0–10.5)

## 2017-05-09 LAB — COMPREHENSIVE METABOLIC PANEL
ALT: 33 U/L (ref 14–54)
AST: 39 U/L (ref 15–41)
Albumin: 3 g/dL — ABNORMAL LOW (ref 3.5–5.0)
Alkaline Phosphatase: 90 U/L (ref 38–126)
Anion gap: 11 (ref 5–15)
BUN: 50 mg/dL — ABNORMAL HIGH (ref 6–20)
CO2: 25 mmol/L (ref 22–32)
Calcium: 9.8 mg/dL (ref 8.9–10.3)
Chloride: 100 mmol/L — ABNORMAL LOW (ref 101–111)
Creatinine, Ser: 3.61 mg/dL — ABNORMAL HIGH (ref 0.44–1.00)
GFR calc Af Amer: 13 mL/min — ABNORMAL LOW (ref >60)
GFR calc non Af Amer: 12 mL/min — ABNORMAL LOW (ref >60)
Glucose, Bld: 98 mg/dL (ref 65–99)
Potassium: 4.7 mmol/L (ref 3.5–5.1)
Sodium: 136 mmol/L (ref 135–145)
Total Bilirubin: 0.7 mg/dL (ref 0.3–1.2)
Total Protein: 7.3 g/dL (ref 6.5–8.1)

## 2017-05-09 LAB — URINALYSIS, ROUTINE W REFLEX MICROSCOPIC
Bacteria, UA: NONE SEEN
Bilirubin Urine: NEGATIVE
Glucose, UA: NEGATIVE mg/dL
Ketones, ur: NEGATIVE mg/dL
Nitrite: NEGATIVE
Protein, ur: 100 mg/dL — AB
Specific Gravity, Urine: 1.014 (ref 1.005–1.030)
pH: 5 (ref 5.0–8.0)

## 2017-05-09 LAB — I-STAT TROPONIN, ED: Troponin i, poc: 0 ng/mL (ref 0.00–0.08)

## 2017-05-09 LAB — I-STAT CG4 LACTIC ACID, ED: LACTIC ACID, VENOUS: 0.95 mmol/L (ref 0.5–1.9)

## 2017-05-09 MED ORDER — ATORVASTATIN CALCIUM 40 MG PO TABS
40.0000 mg | ORAL_TABLET | Freq: Every day | ORAL | Status: DC
  Administered 2017-05-10 – 2017-05-12 (×4): 40 mg via ORAL
  Filled 2017-05-09 (×4): qty 1

## 2017-05-09 MED ORDER — ONDANSETRON HCL 4 MG/2ML IJ SOLN: 4.0000 mg | Freq: Four times a day (QID) | INTRAMUSCULAR | Status: DC | PRN

## 2017-05-09 MED ORDER — ONDANSETRON HCL 4 MG PO TABS: 4.0000 mg | ORAL_TABLET | Freq: Four times a day (QID) | ORAL | Status: DC | PRN

## 2017-05-09 MED ORDER — INSULIN ASPART 100 UNIT/ML ~~LOC~~ SOLN
0.0000 [IU] | Freq: Three times a day (TID) | SUBCUTANEOUS | Status: DC
  Administered 2017-05-11: 1 [IU] via SUBCUTANEOUS
  Administered 2017-05-11: 2 [IU] via SUBCUTANEOUS
  Administered 2017-05-12: 1 [IU] via SUBCUTANEOUS
  Administered 2017-05-12: 2 [IU] via SUBCUTANEOUS
  Administered 2017-05-13: 1 [IU] via SUBCUTANEOUS
  Administered 2017-05-13: 3 [IU] via SUBCUTANEOUS

## 2017-05-09 MED ORDER — ENOXAPARIN SODIUM 30 MG/0.3ML ~~LOC~~ SOLN
30.0000 mg | Freq: Every day | SUBCUTANEOUS | Status: DC
  Administered 2017-05-10 – 2017-05-12 (×4): 30 mg via SUBCUTANEOUS
  Filled 2017-05-09 (×4): qty 0.3

## 2017-05-09 MED ORDER — GABAPENTIN 100 MG PO CAPS
100.0000 mg | ORAL_CAPSULE | Freq: Three times a day (TID) | ORAL | Status: DC
  Administered 2017-05-10 – 2017-05-13 (×11): 100 mg via ORAL
  Filled 2017-05-09 (×11): qty 1

## 2017-05-09 MED ORDER — ALBUTEROL SULFATE (2.5 MG/3ML) 0.083% IN NEBU: 2.5000 mg | INHALATION_SOLUTION | RESPIRATORY_TRACT | Status: DC | PRN

## 2017-05-09 MED ORDER — DOCUSATE SODIUM 100 MG PO CAPS: 100.0000 mg | ORAL_CAPSULE | Freq: Every day | ORAL | Status: DC | PRN

## 2017-05-09 MED ORDER — PANTOPRAZOLE SODIUM 40 MG PO TBEC
40.0000 mg | DELAYED_RELEASE_TABLET | Freq: Every day | ORAL | Status: DC
  Administered 2017-05-10 – 2017-05-13 (×4): 40 mg via ORAL
  Filled 2017-05-09 (×4): qty 1

## 2017-05-09 MED ORDER — FAMOTIDINE 20 MG PO TABS
10.0000 mg | ORAL_TABLET | Freq: Every day | ORAL | Status: DC
  Administered 2017-05-10 – 2017-05-13 (×4): 10 mg via ORAL
  Filled 2017-05-09 (×4): qty 1

## 2017-05-09 MED ORDER — LEVOFLOXACIN IN D5W 750 MG/150ML IV SOLN
750.0000 mg | Freq: Once | INTRAVENOUS | Status: AC
  Administered 2017-05-09: 750 mg via INTRAVENOUS
  Filled 2017-05-09: qty 150

## 2017-05-09 MED ORDER — VANCOMYCIN HCL IN DEXTROSE 1-5 GM/200ML-% IV SOLN
1000.0000 mg | Freq: Once | INTRAVENOUS | Status: AC
  Administered 2017-05-09: 1000 mg via INTRAVENOUS
  Filled 2017-05-09: qty 200

## 2017-05-09 MED ORDER — SODIUM CHLORIDE 0.9 % IV SOLN
INTRAVENOUS | Status: DC
  Administered 2017-05-10: 01:00:00 via INTRAVENOUS

## 2017-05-09 MED ORDER — ACETAMINOPHEN 325 MG PO TABS: 650.0000 mg | ORAL_TABLET | Freq: Four times a day (QID) | ORAL | Status: DC | PRN

## 2017-05-09 MED ORDER — LEVOFLOXACIN IN D5W 500 MG/100ML IV SOLN
500.0000 mg | INTRAVENOUS | Status: DC
  Administered 2017-05-11: 500 mg via INTRAVENOUS
  Filled 2017-05-09 (×2): qty 100

## 2017-05-09 MED ORDER — METOPROLOL TARTRATE 50 MG PO TABS
50.0000 mg | ORAL_TABLET | Freq: Two times a day (BID) | ORAL | Status: DC
  Administered 2017-05-10 – 2017-05-13 (×8): 50 mg via ORAL
  Filled 2017-05-09 (×3): qty 2
  Filled 2017-05-09: qty 1
  Filled 2017-05-09 (×3): qty 2
  Filled 2017-05-09: qty 1

## 2017-05-09 MED ORDER — ACETAMINOPHEN 500 MG PO TABS
1000.0000 mg | ORAL_TABLET | Freq: Once | ORAL | Status: AC
  Administered 2017-05-09: 1000 mg via ORAL
  Filled 2017-05-09: qty 2

## 2017-05-09 MED ORDER — INSULIN ASPART 100 UNIT/ML ~~LOC~~ SOLN
0.0000 [IU] | Freq: Every day | SUBCUTANEOUS | Status: DC
  Administered 2017-05-12: 2 [IU] via SUBCUTANEOUS

## 2017-05-09 MED ORDER — ACETAMINOPHEN 650 MG RE SUPP: 650.0000 mg | Freq: Four times a day (QID) | RECTAL | Status: DC | PRN

## 2017-05-09 MED ORDER — ASPIRIN EC 81 MG PO TBEC
81.0000 mg | DELAYED_RELEASE_TABLET | Freq: Every evening | ORAL | Status: DC
Start: 2017-05-10 — End: 2017-05-13
  Administered 2017-05-10 – 2017-05-12 (×3): 81 mg via ORAL
  Filled 2017-05-09 (×3): qty 1

## 2017-05-09 MED ORDER — GUAIFENESIN ER 600 MG PO TB12
600.0000 mg | ORAL_TABLET | Freq: Two times a day (BID) | ORAL | Status: DC
  Administered 2017-05-10 (×2): 600 mg via ORAL
  Filled 2017-05-09 (×2): qty 1

## 2017-05-09 MED ORDER — ISOSORBIDE MONONITRATE ER 30 MG PO TB24
30.0000 mg | ORAL_TABLET | Freq: Every day | ORAL | Status: DC
  Administered 2017-05-10 – 2017-05-13 (×4): 30 mg via ORAL
  Filled 2017-05-09 (×4): qty 1

## 2017-05-09 MED ORDER — LORATADINE 10 MG PO TABS
10.0000 mg | ORAL_TABLET | Freq: Every day | ORAL | Status: DC
  Administered 2017-05-10 – 2017-05-13 (×4): 10 mg via ORAL
  Filled 2017-05-09 (×4): qty 1

## 2017-05-09 MED ORDER — SODIUM CHLORIDE 0.9 % IV BOLUS
1000.0000 mL | Freq: Once | INTRAVENOUS | Status: AC
  Administered 2017-05-09: 1000 mL via INTRAVENOUS

## 2017-05-09 NOTE — Progress Notes (Signed)
A consult was received from an ED physician for vancomycin and levaquin per pharmacy dosing.  The patient's profile has been reviewed for ht/wt/allergies/indication/available labs.   A one time order has been placed for levaquin and vancomycin    Further antibiotics/pharmacy consults should be ordered by admitting physician if indicated.                       Thank you, Dolly Rias RPh 05/09/2017, 7:55 PM Pager [phone]

## 2017-05-09 NOTE — ED Notes (Signed)
Pt resting  Denies pain  Family at bedside  No acute distress noted

## 2017-05-09 NOTE — ED Triage Notes (Signed)
Patient  Has a productive cough with gray/green sputum. Patient is slow to respond to questions asked. Disoriented to place. Room air sats 86%.

## 2017-05-09 NOTE — H&P (Addendum)
Dana Jackson JJH:[id number] DOB: Aug 06, 1945 DOA: 05/09/2017     PCP: [facility]   Outpatient Specialists:   CARDS:   Dr. Ellyn Hack    Oncology  [facility] gyn/onc Patient arrived to ER on 05/09/17 at 1808  Patient coming from:    home Lives   With family    Chief Complaint:  Chief Complaint  Patient presents with  . Cough  . Altered Mental Status    HPI: Dana Jackson is a 72 y.o. female with medical history significant of CAD, CKD, HTN    Presented with   1 week of fever cough was seen by PCP thought to have viral   URI Patient got worse over the weekend developed confusion worsening cough productive green sputum became disoriented.  No associated chest pain or shortness of breath no sick contacts.  Reports poor p.o. intake for the past few days Patient tried to use cough syrup and Mucinex as well as using her inhaler  on arrival to ER desaturating down to 86% on room air.   Regarding pertinent Chronic problems: History of CAD followed by cardiology status post CABG with known occluded vein graft neuritis medical management on aspirin and statin   While in ER: Rehydrated given allergic reaction to erythromycin was started on Levaquin and vancomycin  Following Medications were ordered in ER: Medications  levofloxacin (LEVAQUIN) IVPB 750 mg (750 mg Intravenous New Bag/Given 05/09/17 2018)  vancomycin (VANCOCIN) IVPB 1000 mg/200 mL premix (has no administration in time range)  sodium chloride 0.9 % bolus 1,000 mL (1,000 mLs Intravenous New Bag/Given 05/09/17 2021)  acetaminophen (TYLENOL) tablet 1,000 mg (1,000 mg Oral Given 05/09/17 2019)    Significant initial  Findings: Abnormal Labs Reviewed  COMPREHENSIVE METABOLIC PANEL - Abnormal; Notable for the following components:      Result Value   Chloride 100 (*)    BUN 50 (*)    Creatinine, Ser 3.61 (*)    Albumin 3.0 (*)    GFR calc non Af Amer 12 (*)    GFR calc Af Amer 13 (*)    All  other components within normal limits  CBC WITH DIFFERENTIAL/PLATELET - Abnormal; Notable for the following components:   WBC 16.6 (*)    RBC 3.26 (*)    Hemoglobin 9.8 (*)    HCT 29.9 (*)    All other components within normal limits     Na 136 K 4.7  Cr    Up from baseline see below Lab Results  Component Value Date   CREATININE 3.61 (H) 05/09/2017   CREATININE 2.90 (H) 09/30/2015   CREATININE 2.07 (H) 08/01/2014      WBC  16.6  HG/HCT Up from baseline see below    Component Value Date/Time   HGB 9.8 (L) 05/09/2017 1944   HCT 29.9 (L) 05/09/2017 1944       Troponin (Point of Care Test) Recent Labs    05/09/17 2000  TROPIPOC 0.00       BNP (last 3 results) No results for input(s): BNP in the last 8760 hours.  ProBNP (last 3 results) No results for input(s): PROBNP in the last 8760 hours.  Lactic Acid, Venous    Component Value Date/Time   LATICACIDVEN 0.95 05/09/2017 2002      UA  No UTI      CXR -  RLL PNA possibly atypical pneumonia    ECG:  Personally reviewed by me showing: HR : 107 Rhythm: Sinus tachycardia  no evidence of ischemic changes QTC 457     ED Triage Vitals  Enc Vitals Group     BP 05/09/17 1854 110/74     Pulse Rate 05/09/17 1854 (!) 112     Resp 05/09/17 1854 20     Temp 05/09/17 1854 100.2 F (37.9 C)     Temp Source 05/09/17 1854 Oral     SpO2 05/09/17 1854 (!) 86 %     Weight 05/09/17 1854 154 lb (69.9 kg)     Height 05/09/17 1854 5' (1.524 m)     Head Circumference --      Peak Flow --      Pain Score 05/09/17 1903 0     Pain Loc --      Pain Edu? --      Excl. in [place]? --   TMAX(24)@       Latest  Blood pressure (!) 168/92, pulse (!) 109, temperature (!) 100.4 F (38 C), temperature source Oral, resp. rate 20, height 5' (1.524 m), weight 69.9 kg (154 lb), SpO2 95 %.     Hospitalist was called for admission for CAP resulting in sepsis  Review of Systems:    Pertinent positives include:  Fevers,  chills, fatigue, excess mucus,productive cough,  Constitutional:  No weight loss, night sweats, weight loss  HEENT:  No headaches, Difficulty swallowing,Tooth/dental problems,Sore throat,  No sneezing, itching, ear ache, nasal congestion, post nasal drip,  Cardio-vascular:  No chest pain, Orthopnea, PND, anasarca, dizziness, palpitations.no Bilateral lower extremity swelling  GI:  No heartburn, indigestion, abdominal pain, nausea, vomiting, diarrhea, change in bowel habits, loss of appetite, melena, blood in stool, hematemesis Resp:  no shortness of breath at rest. No dyspnea on exertion,  , No coughing up of blood.No change in color of mucus.No wheezing. Skin:  no rash or lesions. No jaundice GU:  no dysuria, change in color of urine, no urgency or frequency. No straining to urinate.  No flank pain.  Musculoskeletal:  No joint pain or no joint swelling. No decreased range of motion. No back pain.  Psych:  No change in mood or affect. No depression or anxiety. No memory loss.  Neuro: no localizing neurological complaints, no tingling, no weakness, no double vision, no gait abnormality, no slurred speech, no confusion  As per HPI otherwise 10 point review of systems negative.   Past Medical History:   Past Medical History:  Diagnosis Date  . Arthritis   . Asthma    childhood  . Bacteremia due to Escherichia coli June 2015    Currently being treated with anti-biotics  . CAD in native artery 12/2010   a) Cath for exertional angina & EKG changes: 40% LM, 80% mid LAD.  95% ost cX, 80-90% PDA --> CABG X3; b) Post CABG CATH for + Myoview with basal anterior ischemia -> Ost LAD 80% (diffuse) then 100% after SP2, RI 70% (too small for PCI), Ost-prox Cx 99% & OM1 90%, OstrPDA 80% (small); Occluded SVG-rPDA.  Patent LIMA-dLAD, SVG-OM: culprit ~ p-m LAD pre-LIMA & RI - not good PCI target --> Med Rx  . CKD (chronic kidney disease) stage 3, GFR 30-59 ml/min (HCC)   . Degenerative disc  disease, cervical    Degenerative disc disease, cervical [722.4]  . Endometrial cancer [facility]) November 2015   Treated with TAH with pelvic lymphadenectomy followed by radiation and chemotherapy  . GERD (gastroesophageal reflux disease)   . History of asthma     childhood  .  History of pneumonia    "2-3 times"  . History of unstable angina November/December 2012   T wave inversions in inferolateral leads.  No stress test performed.  . Hyperlipidemia LDL goal <70   . Hypertension, essential, benign   . Pneumonia    walking  . Renal cell carcinoma ([place]) 11/18/2008   T2aNx s/p partial left nephrectomy  . 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 Stroke [facility])    TIA  . TIA (transient ischemic attack) 1992 &  2010      Past Surgical History:  Procedure Laterality Date  . ANTERIOR CERVICAL DECOMP/DISCECTOMY FUSION  10/11/2005   multi-level  . CARDIAC CATHETERIZATION  12/24/10   40% left main, 80% mid LAD, 95% ostial circumflex, 80-90% PDA.  [name] CARDIAC CATHETERIZATION N/A 07/01/2014   Procedure: Left Heart Cath and Cors/Grafts Angiography;  Surgeon: Leonie Man, MD;  Location: [facility] INVASIVE CV LAB:  For Abn Nuc @ [facility]: Ost LAD 80%, mid LAD 100% after S/P 2, 70% RI (no PCI target), ost-prox Cx 99%, OM1 90%. Ost rPDA 80% (~ pre-CABG), occluded SVG-rPDA, patent LIMA-dLAD, SVG OM; potential culprit for abn Nuc scan = pLAD Dz, small RI 70% or PDA -> med Rx  . CAROTID ENDARTERECTOMY Left   . CHOLECYSTECTOMY  ~ 1971  . COLONOSCOPY    . CORONARY ARTERY BYPASS GRAFT  12/25/2010   Procedure: CORONARY ARTERY BYPASS GRAFTING (CABGX3 - LIMA-LAD, SVG RPL, SVG-Circumflex);  Surgeon: Rexene Alberts, MD;  Location: [place];  Service: Open Heart Surgery;  Laterality: N/A;  . DILATATION & CURETTAGE/HYSTEROSCOPY WITH MYOSURE N/A 11/02/2013   Procedure: DILATATION & CURETTAGE/HYSTEROSCOPY WITH MYOSURE ABLATION;  Surgeon: Allena Katz, MD;  Location: [place] [facility];  Service: Gynecology;   Laterality: N/A;  . DOPPLER ECHOCARDIOGRAPHY  12/08/2010   EF =>55%,MILD CONCENTRIC LEFT VENTRICULAR HYPERTROPHY  . LEFT HEART CATHETERIZATION WITH CORONARY ANGIOGRAM N/A 12/24/2010   Procedure: LEFT HEART CATHETERIZATION WITH CORONARY ANGIOGRAM;  Surgeon: Leonie Man, MD;  Location: [facility] CATH LAB;  Service: Cardiovascular;  Laterality: N/A;  . NM MYOCAR SINGLE W/SPECT  07/26/2007   EF 79%, LEFT VENT.FUNCTION NORMAL  . PARTIAL NEPHRECTOMY Left 11/18/2008   left partial nephrectomy for renal cell CA  . PET Myocardial Perfusion Scan  06/13/2014   At [place]: Moderate size, mild severity completely reversible defect involving the basal anterior, mid anterior and apical anterior segments consistent with ischemia. EF 65% with normal global function.  . TONSILLECTOMY     "in college"  . TOTAL ABDOMINAL HYSTERECTOMY  November 2015    At [facility]: Robotic procedure with pelvic lymphadenectomy  . TRANSTHORACIC ECHOCARDIOGRAM  06/13/2014   At [place]: EF 60-65%. Grade 1 diastolic dysfunction. Mild MR. Aortic sclerosis. Moderate pulmonary hypertension  . TUBAL LIGATION  ~ 1984  . UPPER GI ENDOSCOPY    . WISDOM TOOTH EXTRACTION      Social History:  Ambulatory   Independently      reports that she has never smoked. She has never used smokeless tobacco. She reports that she drinks alcohol. She reports that she does not use drugs.     Family History:   Family History  Problem Relation Age of Onset  . Coronary artery disease Father   . Coronary artery disease Sister   . Coronary artery disease Brother   . Stroke Maternal Grandmother   . Heart attack Neg Hx     Allergies: Allergies  Allergen  Reactions  . Codeine Nausea Only  . Erythromycin Itching  . Lopid [Gemfibrozil] Itching  . Pravastatin Other (See Comments)    Used 01/2011 to 03/2011  . Simvastatin Other (See Comments)    Used 07/2007 to 11/2010 chg to pravastatin  . Sulfa Antibiotics     aki       Prior to Admission medications   Medication Sig Start Date End Date Taking? Authorizing Provider  Alpha-Lipoic Acid 600 MG CAPS Take 600 mg by mouth daily.   Yes [provider]  aspirin 81 MG tablet Take 81 mg by mouth every evening.    Yes [provider]  atorvastatin (LIPITOR) 40 MG tablet Take 40 mg by mouth at bedtime.    Yes [provider]  calcitRIOL (ROCALTROL) 0.25 MCG capsule Take 0.25 mcg by mouth daily.   Yes [provider]  furosemide (LASIX) 20 MG tablet Take 20 mg by mouth every evening.  03/07/17  Yes [provider]  gabapentin (NEURONTIN) 100 MG capsule Take 100 mg by mouth 3 (three) times daily.   Yes [provider]  glipiZIDE (GLUCOTROL) 5 MG tablet Take 2.5 mg by mouth 2 (two) times daily before a meal.    Yes [provider]  HYDROcodone-homatropine (HYCODAN) 5-1.5 MG/5ML syrup Take 5 mLs by mouth every 6 (six) hours as needed for cough.  05/06/17  Yes [provider]  isosorbide mononitrate (IMDUR) 30 MG 24 hr tablet TAKE 1 TABLET BY MOUTH EVERY DAY 12/08/16  Yes Leonie Man, MD  loratadine (CLARITIN) 10 MG tablet Take 10 mg by mouth daily.   Yes [provider]  magnesium oxide (MAG-OX) 400 MG tablet Take 400 mg by mouth daily.   Yes [provider]  metoprolol tartrate (LOPRESSOR) 50 MG tablet TAKE 1 TABLET TWICE DAILY 09/09/16  Yes Leonie Man, MD  omeprazole (PRILOSEC) 40 MG capsule Take 40 mg by mouth daily.     Yes [provider]  PROAIR HFA 108 [phone] Base) MCG/ACT inhaler Inhale 2 puffs into the lungs every 6 (six) hours as needed for wheezing or shortness of breath.  05/09/17  Yes [provider]  Probiotic Product (ALIGN PO) Take 1 capsule by mouth daily.    Yes [provider]  Pyridoxine HCl (VITAMIN B-6 PO) Take 1 tablet by mouth daily.    Yes [provider]  ranitidine (ZANTAC) 300 MG tablet Take 1 tablet by mouth at  bedtime. 01/20/15  Yes [provider]  benzonatate (TESSALON) 100 MG capsule TAKE 1 CAPSULE (100 MG TOTAL) BY MOUTH 3 (THREE) TIMES DAILY AS NEEDED FOR UP TO 7 DAYS FOR COUGH. 05/05/17   [provider]  docusate sodium (COLACE) 100 MG capsule Take 100 mg by mouth daily as needed for mild constipation.    [provider]  loperamide (IMODIUM) 2 MG capsule Take 2 mg by mouth 4 (four) times daily as needed for diarrhea or loose stools.    [provider]  nitroGLYCERIN (NITROSTAT) 0.4 MG SL tablet PLACE 1 TABLET (0.4 MG TOTAL) UNDER THE TONGUE EVERY 5 (FIVE) MINUTES AS NEEDED FOR CHEST PAIN. 02/24/16   Leonie Man, MD  ondansetron (ZOFRAN) 4 MG tablet Take 4 mg by mouth every 8 (eight) hours as needed for nausea or vomiting.     [provider]  prochlorperazine (COMPAZINE) 10 MG tablet Take 10 mg by mouth every 6 (six) hours as needed for nausea or vomiting.    [provider]  promethazine (PHENERGAN) 25 MG tablet Take 25 mg by mouth every 6 (six) hours as needed for nausea or vomiting.     [provider]   Physical Exam: Blood pressure (!) 168/92, pulse (!) 109, temperature (!) 100.4 F (38 C), temperature source Oral, resp. rate 20, height 5' (1.524 m), weight 69.9 kg (154 lb), SpO2 95 %. 1. General:  in No Acute distress  well  -appearing 2. Psychological: Alert and   Oriented 3. Head/ENT:    Dry Mucous Membranes                          Head Non traumatic, neck supple                            Poor Dentition 4. SKIN:   decreased Skin turgor,  Skin clean Dry and intact no rash 5. Heart: Regular rate and rhythm no Murmur, no Rub or gallop 6. Lungs: distant no wheezes some crackles   7. Abdomen: Soft, non-tender, Non distended   obese  bowel sounds present 8. Lower extremities: no clubbing, cyanosis, or edema 9. Neurologically Grossly intact, moving all 4 extremities equally  10. MSK: Normal range of motion   LABS:      Recent Labs  Lab 05/09/17 1944  WBC 16.6*  NEUTROABS PENDING  HGB 9.8*  HCT 29.9*  MCV 91.7  PLT 035   Basic Metabolic Panel: Recent Labs  Lab 05/09/17 1944  NA 136  K 4.7  CL 100*  CO2 25  GLUCOSE 98  BUN 50*  CREATININE 3.61*  CALCIUM 9.8      Recent Labs  Lab 05/09/17 1944  AST 39  ALT 33  ALKPHOS 90  BILITOT 0.7  PROT 7.3  ALBUMIN 3.0*   No results for input(s): LIPASE, AMYLASE in the last 168 hours. No results for input(s): AMMONIA in the last 168 hours.    HbA1C: No results for input(s): HGBA1C in the last 72 hours. CBG: No results for input(s): GLUCAP in the last 168 hours.    Urine analysis:    Component Value Date/Time   COLORURINE YELLOW 06/10/2014 1259   APPEARANCEUR CLOUDY (A) 06/10/2014 1259   LABSPEC 1.008 06/10/2014 1259   PHURINE 5.5 06/10/2014 1259   GLUCOSEU NEGATIVE 06/10/2014 1259   HGBUR MODERATE (A) 06/10/2014 1259   BILIRUBINUR NEGATIVE 06/10/2014 1259   KETONESUR NEGATIVE 06/10/2014 1259   PROTEINUR 100 (A) 06/10/2014 1259   UROBILINOGEN 0.2 06/10/2014 1259   NITRITE NEGATIVE 06/10/2014 1259   LEUKOCYTESUR LARGE (A) 06/10/2014 1259    Cultures:    Component Value Date/Time   SDES URINE, RANDOM 06/10/2014 1259   SPECREQUEST NONE 06/10/2014 1259   CULT  06/10/2014 1259    ESCHERICHIA COLI Performed at Campus 06/12/2014 FINAL 06/10/2014 1259     Radiological Exams on Admission: Dg Chest 2 View  Result Date: 05/09/2017 CLINICAL DATA:  Productive cough EXAM: CHEST - 2 VIEW COMPARISON:  08/25/2016 FINDINGS: Surgical changes in the cervical spine. Right-sided central venous catheter tip overlies the cavoatrial region. Post sternotomy changes. Subsegmental atelectasis at the left base. Slightly nodular appearing airspace disease in the right mid and lower lung. Mild cardiomegaly with aortic atherosclerosis. No pneumothorax. IMPRESSION: 1. Slightly nodular appearing airspace disease in the right  mid to lower lung, suspect pneumonia, possible atypical pneumonia. 2. Mild cardiomegaly 3. Subsegmental atelectasis  at the left base. Electronically Signed   By: Donavan Foil M.D.   On: 05/09/2017 19:54    Chart has been reviewed    Assessment/Plan  72 y.o. female with medical history significant of CAD, CKD, HTN,Dm2  Admitted for CAP resulting in sepsis  Present on Admission: . CAP (community acquired pneumonia) -  - will admit for treatment of CAP will start on appropriate antibiotic coverage.   Obtain:  sputum cultures,                  blood cultures if febrile or if decompensates.                   strep pneumo UA antigen,                    Provide oxygen as needed.  Given nodularity found on chest x-ray would benefit from outpatient follow-up imaging to document resolution  . Acute-on-chronic kidney injury ([place]) - - likely secondary to dehydration, check FeNA and if not improved with IVF   . CAD (coronary artery disease) of artery bypass graft -  - chronic, continue aspirin  and statin  and beta blocker    . Chronic kidney disease (CKD), stage IV (severe) (HCC) avoid nephrotoxic medications and rehydrate for creatinine . Essential hypertension - stable continue home medications . Hyperlipidemia LDL goal <70 -  stable continue medications  . Type 2 diabetes mellitus with renal manifestations, controlled (HCC) -  - Order Sensitive  SI     -  check TSH and HgA1C  - Hold by mouth medications    . Sepsis ([place]) currently sepsis physiology improving continue with IV hydration await results of blood cultures continue IV antibiotics  . Anemia obtain anemia panel in a.m. . Acute encephalopathy -   - most likely multifactorial secondary to combination of  infection  mild dehydration hypoxia   - Will rehydrate   - treat underlining infection     - if no improvement may need further imaging to evaluate for CNS pathology    Other plan as per orders.  DVT prophylaxis:     Lovenox     Code Status:  FULL CODE   as per patient  I had personally discussed CODE STATUS with patient   Family Communication:   Family not  at  Bedside     Disposition Plan:    To home once workup is complete and patient is stable                              Consults called: none  Admission status: inpatient      Level of care        medical floor          Toy Baker 05/09/2017, 10:30 PM    [facility]  Pager [phone]   after 2 AM please page floor coverage PA If 7AM-7PM, please contact the day team taking care of the patient  [email or web address]  Password [email or web address]

## 2017-05-09 NOTE — ED Provider Notes (Signed)
[facility] DEPT Provider Note   CSN: [id number] Arrival date & time: 05/09/17  1808     History   Chief Complaint Chief Complaint  Patient presents with  . Cough  . Altered Mental Status    HPI Dana Jackson is a 72 y.o. female with history of CAD, CKD, hypertension who presents with a 4 to 5-day history of cough and 2-day history of confusion and altered mental status.  Patient saw her primary care provider 3 days ago and was prescribed cough syrup, Mucinex, and a pro-air inhaler.  Over the past few days, her husband reports she has become altered and speaking out of her head.  Patient denies any chest pain or shortness of breath.  She denies fever at home.  She also denies any abdominal pain, nausea, vomiting, urinary symptoms.  Patient has not eaten in the past 2 to 3 days.  HPI  Past Medical History:  Diagnosis Date  . Arthritis   . Asthma    childhood  . Bacteremia due to Escherichia coli June 2015    Currently being treated with anti-biotics  . CAD in native artery 12/2010   a) Cath for exertional angina & EKG changes: 40% LM, 80% mid LAD.  95% ost cX, 80-90% PDA --> CABG X3; b) Post CABG CATH for + Myoview with basal anterior ischemia -> Ost LAD 80% (diffuse) then 100% after SP2, RI 70% (too small for PCI), Ost-prox Cx 99% & OM1 90%, OstrPDA 80% (small); Occluded SVG-rPDA.  Patent LIMA-dLAD, SVG-OM: culprit ~ p-m LAD pre-LIMA & RI - not good PCI target --> Med Rx  . CKD (chronic kidney disease) stage 3, GFR 30-59 ml/min (HCC)   . Degenerative disc disease, cervical    Degenerative disc disease, cervical [722.4]  . Endometrial cancer [facility]) November 2015   Treated with TAH with pelvic lymphadenectomy followed by radiation and chemotherapy  . GERD (gastroesophageal reflux disease)   . History of asthma     childhood  . History of pneumonia    "2-3 times"  . History of unstable angina November/December 2012   T wave inversions in  inferolateral leads.  No stress test performed.  . Hyperlipidemia LDL goal <70   . Hypertension, essential, benign   . Pneumonia    walking  . Renal cell carcinoma ([place]) 11/18/2008   T2aNx s/p partial left nephrectomy  . 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 Stroke [facility])    TIA  . TIA (transient ischemic attack) 1992 &  2010    Patient Active Problem List   Diagnosis Date Noted  . Sepsis ([place]) 05/09/2017  . CAP (community acquired pneumonia) 05/09/2017  . Acute encephalopathy 05/09/2017  . Coronary artery disease involving native coronary artery without angina pectoris 09/08/2014  . Acute-on-chronic kidney injury ([place]) 07/29/2014  . Hyperkalemia 07/29/2014  . CAD (coronary artery disease) of artery bypass graft 07/29/2014  . Type 2 diabetes mellitus with renal manifestations, controlled ([place]) 07/10/2014  . Endometrial cancer ([place]) 07/10/2014  . Obesity (BMI 30-39.9) 09/03/2012  . Hyperlipidemia LDL goal <70   . Anemia 12/28/2010  . S/P CABG x 3 12/25/2010  . Renal cell carcinoma ([place]) 12/23/2010    Class: History of  . Chronic kidney disease (CKD), stage IV (severe) ([place]) 12/23/2010    Class: Diagnosis of  . Essential hypertension 12/23/2010    Class: History of    Past Surgical History:  Procedure Laterality Date  .  ANTERIOR CERVICAL DECOMP/DISCECTOMY FUSION  10/11/2005   multi-level  . CARDIAC CATHETERIZATION  12/24/10   40% left main, 80% mid LAD, 95% ostial circumflex, 80-90% PDA.  [name] CARDIAC CATHETERIZATION N/A 07/01/2014   Procedure: Left Heart Cath and Cors/Grafts Angiography;  Surgeon: Leonie Man, MD;  Location: [facility] INVASIVE CV LAB:  For Abn Nuc @ [facility]: Ost LAD 80%, mid LAD 100% after S/P 2, 70% RI (no PCI target), ost-prox Cx 99%, OM1 90%. Ost rPDA 80% (~ pre-CABG), occluded SVG-rPDA, patent LIMA-dLAD, SVG OM; potential culprit for abn Nuc scan = pLAD Dz, small RI 70% or PDA -> med Rx  . CAROTID ENDARTERECTOMY Left   . CHOLECYSTECTOMY   ~ 1971  . COLONOSCOPY    . CORONARY ARTERY BYPASS GRAFT  12/25/2010   Procedure: CORONARY ARTERY BYPASS GRAFTING (CABGX3 - LIMA-LAD, SVG RPL, SVG-Circumflex);  Surgeon: Rexene Alberts, MD;  Location: [facility];  Service: Open Heart Surgery;  Laterality: N/A;  . DILATATION & CURETTAGE/HYSTEROSCOPY WITH MYOSURE N/A 11/02/2013   Procedure: DILATATION & CURETTAGE/HYSTEROSCOPY WITH MYOSURE ABLATION;  Surgeon: Allena Katz, MD;  Location: [place] [facility];  Service: Gynecology;  Laterality: N/A;  . DOPPLER ECHOCARDIOGRAPHY  12/08/2010   EF =>55%,MILD CONCENTRIC LEFT VENTRICULAR HYPERTROPHY  . LEFT HEART CATHETERIZATION WITH CORONARY ANGIOGRAM N/A 12/24/2010   Procedure: LEFT HEART CATHETERIZATION WITH CORONARY ANGIOGRAM;  Surgeon: Leonie Man, MD;  Location: [facility] CATH LAB;  Service: Cardiovascular;  Laterality: N/A;  . NM MYOCAR SINGLE W/SPECT  07/26/2007   EF 79%, LEFT VENT.FUNCTION NORMAL  . PARTIAL NEPHRECTOMY Left 11/18/2008   left partial nephrectomy for renal cell CA  . PET Myocardial Perfusion Scan  06/13/2014   At [facility]: Moderate size, mild severity completely reversible defect involving the basal anterior, mid anterior and apical anterior segments consistent with ischemia. EF 65% with normal global function.  . TONSILLECTOMY     "in college"  . TOTAL ABDOMINAL HYSTERECTOMY  November 2015    At [facility]: Robotic procedure with pelvic lymphadenectomy  . TRANSTHORACIC ECHOCARDIOGRAM  06/13/2014   At [place]: EF 60-65%. Grade 1 diastolic dysfunction. Mild MR. Aortic sclerosis. Moderate pulmonary hypertension  . TUBAL LIGATION  ~ 1984  . UPPER GI ENDOSCOPY    . WISDOM TOOTH EXTRACTION       OB History   None      Home Medications    Prior to Admission medications   Medication Sig Start Date End Date Taking? Authorizing Provider  Alpha-Lipoic Acid 600 MG CAPS Take 600 mg by mouth daily.   Yes [provider]  aspirin 81 MG tablet Take 81 mg by mouth every  evening.    Yes [provider]  atorvastatin (LIPITOR) 40 MG tablet Take 40 mg by mouth at bedtime.    Yes [provider]  calcitRIOL (ROCALTROL) 0.25 MCG capsule Take 0.25 mcg by mouth daily.   Yes [provider]  furosemide (LASIX) 20 MG tablet Take 20 mg by mouth every evening.  03/07/17  Yes [provider]  gabapentin (NEURONTIN) 100 MG capsule Take 100 mg by mouth 3 (three) times daily.   Yes [provider]  glipiZIDE (GLUCOTROL) 5 MG tablet Take 2.5 mg by mouth 2 (two) times daily before a meal.    Yes [provider]  HYDROcodone-homatropine (HYCODAN) 5-1.5 MG/5ML syrup Take 5 mLs by mouth every 6 (six) hours as needed for cough.  05/06/17  Yes [provider]  isosorbide mononitrate (IMDUR) 30  MG 24 hr tablet TAKE 1 TABLET BY MOUTH EVERY DAY 12/08/16  Yes Leonie Man, MD  loratadine (CLARITIN) 10 MG tablet Take 10 mg by mouth daily.   Yes [provider]  magnesium oxide (MAG-OX) 400 MG tablet Take 400 mg by mouth daily.   Yes [provider]  metoprolol tartrate (LOPRESSOR) 50 MG tablet TAKE 1 TABLET TWICE DAILY 09/09/16  Yes Leonie Man, MD  omeprazole (PRILOSEC) 40 MG capsule Take 40 mg by mouth daily.     Yes [provider]  PROAIR HFA 108 [phone] Base) MCG/ACT inhaler Inhale 2 puffs into the lungs every 6 (six) hours as needed for wheezing or shortness of breath.  05/09/17  Yes [provider]  Probiotic Product (ALIGN PO) Take 1 capsule by mouth daily.    Yes [provider]  Pyridoxine HCl (VITAMIN B-6 PO) Take 1 tablet by mouth daily.    Yes [provider]  ranitidine (ZANTAC) 300 MG tablet Take 1 tablet by mouth at bedtime. 01/20/15  Yes [provider]  benzonatate (TESSALON) 100 MG capsule TAKE 1 CAPSULE (100 MG TOTAL) BY MOUTH 3 (THREE) TIMES DAILY AS NEEDED FOR UP TO 7 DAYS FOR COUGH. 05/05/17   [provider]  docusate sodium (COLACE) 100  MG capsule Take 100 mg by mouth daily as needed for mild constipation.    [provider]  loperamide (IMODIUM) 2 MG capsule Take 2 mg by mouth 4 (four) times daily as needed for diarrhea or loose stools.    [provider]  nitroGLYCERIN (NITROSTAT) 0.4 MG SL tablet PLACE 1 TABLET (0.4 MG TOTAL) UNDER THE TONGUE EVERY 5 (FIVE) MINUTES AS NEEDED FOR CHEST PAIN. 02/24/16   Leonie Man, MD  ondansetron (ZOFRAN) 4 MG tablet Take 4 mg by mouth every 8 (eight) hours as needed for nausea or vomiting.     [provider]  prochlorperazine (COMPAZINE) 10 MG tablet Take 10 mg by mouth every 6 (six) hours as needed for nausea or vomiting.    [provider]  promethazine (PHENERGAN) 25 MG tablet Take 25 mg by mouth every 6 (six) hours as needed for nausea or vomiting.     [provider]    Family History Family History  Problem Relation Age of Onset  . Coronary artery disease Father   . Coronary artery disease Sister   . Coronary artery disease Brother   . Stroke Maternal Grandmother   . Heart attack Neg Hx     Social History Social History   Tobacco Use  . Smoking status: Never Smoker  . Smokeless tobacco: Never Used  Substance Use Topics  . Alcohol use: Yes    Alcohol/week: 0.0 oz    Comment: "mixed drink once a year"  . Drug use: No     Allergies   Codeine; Erythromycin; Lopid [gemfibrozil]; Pravastatin; Simvastatin; and Sulfa antibiotics   Review of Systems Review of Systems  Constitutional: Positive for appetite change and fever (febrile in ED). Negative for chills.  HENT: Negative for facial swelling and sore throat.   Respiratory: Positive for cough. Negative for shortness of breath.   Cardiovascular: Negative for chest pain.  Gastrointestinal: Negative for abdominal pain, nausea and vomiting.  Genitourinary: Negative for dysuria.  Musculoskeletal: Negative for back pain.  Skin: Negative for rash and wound.  Neurological:  Negative for headaches.  Psychiatric/Behavioral: Positive for confusion. The patient is not nervous/anxious.      Physical Exam Updated Vital  Signs BP (!) 148/69 (BP Location: Right Arm)   Pulse 88   Temp 98.7 F (37.1 C) (Oral)   Resp 17   Ht 5' (1.524 m)   Wt 69.9 kg (154 lb)   SpO2 98%   BMI 30.08 kg/m   Physical Exam  Constitutional: She appears well-developed and well-nourished. No distress.  HENT:  Head: Normocephalic and atraumatic.  Mouth/Throat: Oropharynx is clear and moist. No oropharyngeal exudate.  Eyes: Pupils are equal, round, and reactive to light. Conjunctivae are normal. Right eye exhibits no discharge. Left eye exhibits no discharge. No scleral icterus.  Neck: Normal range of motion. Neck supple. No thyromegaly present.  Cardiovascular: Normal rate, regular rhythm, normal heart sounds and intact distal pulses. Exam reveals no gallop and no friction rub.  No murmur heard. Pulmonary/Chest: Effort normal. No stridor. No respiratory distress. She has no wheezes. She has rales (bilateral bases).  Abdominal: Soft. Bowel sounds are normal. She exhibits no distension. There is no tenderness. There is no rebound and no guarding.  Musculoskeletal: She exhibits no edema.  Lymphadenopathy:    She has no cervical adenopathy.  Neurological: She is alert. Coordination normal.  Patient answers my questions with answers do not make sense Alert and oriented to self only  Skin: Skin is warm and dry. No rash noted. She is not diaphoretic. No pallor.  Psychiatric: She has a normal mood and affect.  Nursing note and vitals reviewed.    ED Treatments / Results  Labs (all labs ordered are listed, but only abnormal results are displayed) Labs Reviewed  COMPREHENSIVE METABOLIC PANEL - Abnormal; Notable for the following components:      Result Value   Chloride 100 (*)    BUN 50 (*)    Creatinine, Ser 3.61 (*)    Albumin 3.0 (*)    GFR calc non Af Amer 12 (*)    GFR calc  Af Amer 13 (*)    All other components within normal limits  CBC WITH DIFFERENTIAL/PLATELET - Abnormal; Notable for the following components:   WBC 16.6 (*)    RBC 3.26 (*)    Hemoglobin 9.8 (*)    HCT 29.9 (*)    Neutro Abs 13.6 (*)    Monocytes Absolute 1.5 (*)    All other components within normal limits  URINALYSIS, ROUTINE W REFLEX MICROSCOPIC - Abnormal; Notable for the following components:   APPearance HAZY (*)    Hgb urine dipstick SMALL (*)    Protein, ur 100 (*)    Leukocytes, UA TRACE (*)    All other components within normal limits  CULTURE, BLOOD (ROUTINE X 2)  CULTURE, BLOOD (ROUTINE X 2)  MRSA PCR SCREENING  I-STAT CG4 LACTIC ACID, ED  I-STAT TROPONIN, ED  I-STAT CG4 LACTIC ACID, ED    EKG EKG Interpretation  Date/Time:  Monday May 09 2017 19:02:18 EDT Ventricular Rate:  107 PR Interval:    QRS Duration: 86 QT Interval:  343 QTC Calculation: 458 R Axis:   65 Text Interpretation:  Sinus tachycardia Borderline T abnormalities, lateral leads Non-specific TW changes since last EKG Otherwise no significant change Confirmed by Duffy Bruce [phone]) on 05/09/2017 11:10:14 PM   Radiology Dg Chest 2 View  Result Date: 05/09/2017 CLINICAL DATA:  Productive cough EXAM: CHEST - 2 VIEW COMPARISON:  08/25/2016 FINDINGS: Surgical changes in the cervical spine. Right-sided central venous catheter tip overlies the cavoatrial region. Post sternotomy changes. Subsegmental atelectasis at the left base. Slightly nodular appearing airspace  disease in the right mid and lower lung. Mild cardiomegaly with aortic atherosclerosis. No pneumothorax. IMPRESSION: 1. Slightly nodular appearing airspace disease in the right mid to lower lung, suspect pneumonia, possible atypical pneumonia. 2. Mild cardiomegaly 3. Subsegmental atelectasis at the left base. Electronically Signed   By: Donavan Foil M.D.   On: 05/09/2017 19:54    Procedures Procedures (including critical care  time)  CRITICAL CARE Performed by: Frederica Kuster   Total critical care time: 30 minutes  Critical care time was exclusive of separately billable procedures and treating other patients.  Critical care was necessary to treat or prevent imminent or life-threatening deterioration.  Critical care was time spent personally by me on the following activities: development of treatment plan with patient and/or surrogate as well as nursing, discussions with consultants, evaluation of patient's response to treatment, examination of patient, obtaining history from patient or surrogate, ordering and performing treatments and interventions, ordering and review of laboratory studies, ordering and review of radiographic studies, pulse oximetry and re-evaluation of patient's condition.   Medications Ordered in ED Medications  acetaminophen (TYLENOL) tablet 1,000 mg (1,000 mg Oral Given 05/09/17 2019)  levofloxacin (LEVAQUIN) IVPB 750 mg (0 mg Intravenous Stopped 05/09/17 2158)  vancomycin (VANCOCIN) IVPB 1000 mg/200 mL premix (1,000 mg Intravenous New Bag/Given 05/09/17 2158)  sodium chloride 0.9 % bolus 1,000 mL (0 mLs Intravenous Stopped 05/09/17 2143)     Initial Impression / Assessment and Plan / ED Course  I have reviewed the triage vital signs and the nursing notes.  Pertinent labs & imaging results that were available during my care of the patient were reviewed by me and considered in my medical decision making (see chart for details).     Patient with pneumonia in the right lobe is with sepsis criteria.  She has AKI.  She has new oxygen requirement.  Chest x-ray shows slightly nodular appearing airspace disease in the right mid to lower lung, suspect pneumonia, possible atypical pneumonia.  WBC 16.6.  CMP shows BUN 50, creatinine 3.61.  Lactic is negative.  Troponin is negative.  UA shows small hematuria, trace leukocytes, 6-10 WBCs.  Blood cultures sent.  1 L fluids given.  Levaquin and  vancomycin initiated in the ED.  I consulted Dr. Roel Cluck with [facility] who will admit the patient for further management.  Patient and husband understand agree with plan.  Patient also evaluated by Dr. Ellender Hose who guided the patient's management and agrees with plan.  Final Clinical Impressions(s) / ED Diagnoses   Final diagnoses:  Community acquired pneumonia of right lower lobe of lung ([place])  Sepsis, due to unspecified organism [facility])  Hypoxia    ED Discharge Orders    None       Frederica Kuster, PA-C 05/09/17 2313    Duffy Bruce, MD 05/10/17 1654

## 2017-05-09 NOTE — Progress Notes (Signed)
Pharmacy Antibiotic Note  Dana Jackson is a 72 y.o. female admitted on 05/09/2017 with pneumonia.  Pharmacy has been consulted for levaquin dosing.  Plan: Levaquin 750 mg x1 then 500 mg IV q48h F/u scr/cultures  Height: 5' (152.4 cm) Weight: 154 lb (69.9 kg) IBW/kg (Calculated) : 45.5  Temp (24hrs), Avg:99.8 F (37.7 C), Min:98.7 F (37.1 C), Max:100.4 F (38 C)  Recent Labs  Lab 05/09/17 1944 05/09/17 2002  WBC 16.6*  --   CREATININE 3.61*  --   LATICACIDVEN  --  0.95    Estimated Creatinine Clearance: 12.3 mL/min (A) (by C-G formula based on SCr of 3.61 mg/dL (H)).    Allergies  Allergen Reactions  . Codeine Nausea Only  . Erythromycin Itching  . Lopid [Gemfibrozil] Itching  . Pravastatin Other (See Comments)    Used 01/2011 to 03/2011  . Simvastatin Other (See Comments)    Used 07/2007 to 11/2010 chg to pravastatin  . Sulfa Antibiotics     aki     Antimicrobials this admission: 4/29 levaquin >>    >>   Dose adjustments this admission:   Microbiology results:  BCx:   UCx:    Sputum:    MRSA PCR:   Thank you for allowing pharmacy to be a part of this patient's care.  Dorrene German 05/09/2017 11:56 PM

## 2017-05-09 NOTE — ED Notes (Signed)
ED TO INPATIENT HANDOFF REPORT  Name/Age/Gender Dana Jackson 72 y.o. female  Code Status Code Status History    Date Active Date Inactive Code Status Order ID Comments User Context   07/29/2014 2235 08/01/2014 1352 Full Code [id number]  Quintella Baton, MD ED   07/01/2014 1619 07/02/2014 1353 Full Code [id number]  Leonie Man, MD Inpatient   12/28/2010 0820 12/29/2010 1643 Full Code [id number]  Rexene Alberts, MD Inpatient      Home/SNF/Other Home  Chief Complaint cough   Level of Care/Admitting Diagnosis ED Disposition    ED Disposition Condition [place] [facility] Area: [facility] [[id number]]  Level of Care: Med-Surg [16]  Diagnosis: CAP (community acquired pneumonia) [149702]  Admitting Physician: Toy Baker [[id number]]  Attending Physician: Toy Baker [[id number]]  Estimated length of stay: 3 - 4 days  Certification:: I certify this patient will need inpatient services for at least 2 midnights  PT Class (Do Not Modify): Inpatient [101]  PT Acc Code (Do Not Modify): Private [1]       Medical History Past Medical History:  Diagnosis Date  . Arthritis   . Asthma    childhood  . Bacteremia due to Escherichia coli June 2015    Currently being treated with anti-biotics  . CAD in native artery 12/2010   a) Cath for exertional angina & EKG changes: 40% LM, 80% mid LAD.  95% ost cX, 80-90% PDA --> CABG X3; b) Post CABG CATH for + Myoview with basal anterior ischemia -> Ost LAD 80% (diffuse) then 100% after SP2, RI 70% (too small for PCI), Ost-prox Cx 99% & OM1 90%, OstrPDA 80% (small); Occluded SVG-rPDA.  Patent LIMA-dLAD, SVG-OM: culprit ~ p-m LAD pre-LIMA & RI - not good PCI target --> Med Rx  . CKD (chronic kidney disease) stage 3, GFR 30-59 ml/min (HCC)   . Degenerative disc disease, cervical    Degenerative disc disease, cervical [722.4]  . Endometrial cancer [facility]) November 2015   Treated with TAH with pelvic lymphadenectomy followed  by radiation and chemotherapy  . GERD (gastroesophageal reflux disease)   . History of asthma     childhood  . History of pneumonia    "2-3 times"  . History of unstable angina November/December 2012   T wave inversions in inferolateral leads.  No stress test performed.  . Hyperlipidemia LDL goal <70   . Hypertension, essential, benign   . Pneumonia    walking  . Renal cell carcinoma ([place]) 11/18/2008   T2aNx s/p partial left nephrectomy  . 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 Stroke [facility])    TIA  . TIA (transient ischemic attack) 1992 &  2010    Allergies Allergies  Allergen Reactions  . Codeine Nausea Only  . Erythromycin Itching  . Lopid [Gemfibrozil] Itching  . Pravastatin Other (See Comments)    Used 01/2011 to 03/2011  . Simvastatin Other (See Comments)    Used 07/2007 to 11/2010 chg to pravastatin  . Sulfa Antibiotics     aki     IV Location/Drains/Wounds Patient Lines/Drains/Airways Status   Active Line/Drains/Airways    Name:   Placement date:   Placement time:   Site:   Days:   Implanted Port Right Chest   -    -    Chest      Peripheral IV 05/09/17 Right Antecubital   05/09/17    1926    Antecubital  less than 1   Peripheral IV 05/09/17 Left Antecubital   05/09/17    1926    Antecubital   less than 1   Wound / Incision (Open or Dehisced) 07/29/14 Other (Comment) Groin Right Open area is 1 cm in length, 0.5 cm in width, 0.5 cm in depth. 100% granulation present, scant amouth of serosanguinous drainage.   07/29/14    1145    Groin   1015          Labs/Imaging Results for orders placed or performed during the hospital encounter of 05/09/17 (from the past 48 hour(s))  Comprehensive metabolic panel     Status: Abnormal   Collection Time: 05/09/17  7:44 PM  Result Value Ref Range   Sodium 136 135 - 145 mmol/L   Potassium 4.7 3.5 - 5.1 mmol/L   Chloride 100 (L) 101 - 111 mmol/L   CO2 25 22 - 32 mmol/L   Glucose, Bld 98 65 - 99  mg/dL   BUN 50 (H) 6 - 20 mg/dL   Creatinine, Ser 3.61 (H) 0.44 - 1.00 mg/dL   Calcium 9.8 8.9 - 10.3 mg/dL   Total Protein 7.3 6.5 - 8.1 g/dL   Albumin 3.0 (L) 3.5 - 5.0 g/dL   AST 39 15 - 41 U/L   ALT 33 14 - 54 U/L   Alkaline Phosphatase 90 38 - 126 U/L   Total Bilirubin 0.7 0.3 - 1.2 mg/dL   GFR calc non Af Amer 12 (L) >60 mL/min   GFR calc Af Amer 13 (L) >60 mL/min    Comment: (NOTE) The eGFR has been calculated using the CKD EPI equation. This calculation has not been validated in all clinical situations. eGFR's persistently <60 mL/min signify possible Chronic Kidney Disease.    Anion gap 11 5 - 15    Comment: Performed at [facility], [place] [address] [place], [place] [address]  CBC WITH DIFFERENTIAL     Status: Abnormal   Collection Time: 05/09/17  7:44 PM  Result Value Ref Range   WBC 16.6 (H) 4.0 - 10.5 K/uL   RBC 3.26 (L) 3.87 - 5.11 MIL/uL   Hemoglobin 9.8 (L) 12.0 - 15.0 g/dL   HCT 29.9 (L) 36.0 - 46.0 %   MCV 91.7 78.0 - 100.0 fL   MCH 30.1 26.0 - 34.0 pg   MCHC 32.8 30.0 - 36.0 g/dL   RDW 13.6 11.5 - 15.5 %   Platelets 264 150 - 400 K/uL   Neutrophils Relative % 82 %   Lymphocytes Relative 8 %   Monocytes Relative 9 %   Eosinophils Relative 1 %   Basophils Relative 0 %   Neutro Abs 13.6 (H) 1.7 - 7.7 K/uL   Lymphs Abs 1.3 0.7 - 4.0 K/uL   Monocytes Absolute 1.5 (H) 0.1 - 1.0 K/uL   Eosinophils Absolute 0.2 0.0 - 0.7 K/uL   Basophils Absolute 0.0 0.0 - 0.1 K/uL   Smear Review MORPHOLOGY UNREMARKABLE     Comment: Performed at [facility], [place] [address], [place], [place] [address]  I-stat troponin, ED (not at [facility], [facility])     Status: None   Collection Time: 05/09/17  8:00 PM  Result Value Ref Range   Troponin i, poc 0.00 0.00 - 0.08 ng/mL   Comment 3            Comment: Due to the release kinetics of cTnI, a negative result within the first hours of the onset  of symptoms does not rule out myocardial infarction with  certainty. If myocardial infarction is still suspected, repeat the test at appropriate intervals.   I-Stat CG4 Lactic Acid, ED  (not at  [facility])     Status: None   Collection Time: 05/09/17  8:02 PM  Result Value Ref Range   Lactic Acid, Venous 0.95 0.5 - 1.9 mmol/L  Urinalysis, Routine w reflex microscopic     Status: Abnormal   Collection Time: 05/09/17 10:02 PM  Result Value Ref Range   Color, Urine YELLOW YELLOW   APPearance HAZY (A) CLEAR   Specific Gravity, Urine 1.014 1.005 - 1.030   pH 5.0 5.0 - 8.0   Glucose, UA NEGATIVE NEGATIVE mg/dL   Hgb urine dipstick SMALL (A) NEGATIVE   Bilirubin Urine NEGATIVE NEGATIVE   Ketones, ur NEGATIVE NEGATIVE mg/dL   Protein, ur 100 (A) NEGATIVE mg/dL   Nitrite NEGATIVE NEGATIVE   Leukocytes, UA TRACE (A) NEGATIVE   RBC / HPF 0-5 0 - 5 RBC/hpf   WBC, UA 6-10 0 - 5 WBC/hpf   Bacteria, UA NONE SEEN NONE SEEN   Squamous Epithelial / LPF 0-5 0 - 5    Comment: Please note change in reference range. Performed at [facility], [place] [address], [place], [place] [address]    Dg Chest 2 View  Result Date: 05/09/2017 CLINICAL DATA:  Productive cough EXAM: CHEST - 2 VIEW COMPARISON:  08/25/2016 FINDINGS: Surgical changes in the cervical spine. Right-sided central venous catheter tip overlies the cavoatrial region. Post sternotomy changes. Subsegmental atelectasis at the left base. Slightly nodular appearing airspace disease in the right mid and lower lung. Mild cardiomegaly with aortic atherosclerosis. No pneumothorax. IMPRESSION: 1. Slightly nodular appearing airspace disease in the right mid to lower lung, suspect pneumonia, possible atypical pneumonia. 2. Mild cardiomegaly 3. Subsegmental atelectasis at the left base. Electronically Signed   By: Donavan Foil M.D.   On: 05/09/2017 19:54    Pending Labs Unresulted Labs (From admission, onward)   Start     Ordered   05/09/17 2253  MRSA PCR Screening  Once,   R    Question:  Patient  immune status  Answer:  Normal   05/09/17 2252   05/09/17 1930  Blood Culture (routine x 2)  BLOOD CULTURE X 2,   STAT     05/09/17 1931   Signed and Held  Vitamin B12  (Anemia Panel (PNL))  Tomorrow morning,   R     Signed and Held   Signed and Held  Folate  (Anemia Panel (PNL))  Tomorrow morning,   R     Signed and Held   Signed and Held  Iron and TIBC  (Anemia Panel (PNL))  Tomorrow morning,   R     Signed and Held   Signed and Held  Ferritin  (Anemia Panel (PNL))  Tomorrow morning,   R     Signed and Held   Signed and Held  Reticulocytes  (Anemia Panel (PNL))  Tomorrow morning,   R     Signed and Held   Signed and Held  HIV antibody (Routine Screening)  Tomorrow morning,   R     Signed and Held   Signed and Held  Culture, sputum-assessment  Once,   R    Question:  Patient immune status  Answer:  Normal   Signed and Held   Signed and Held  Gram stain  Once,   R    Question:  Patient immune status  Answer:  Normal   Signed and Held   Signed and Held  Strep pneumoniae urinary antigen  Once,   R     Signed and Held   Signed and Held  Hemoglobin A1c  Tomorrow morning,   R    Comments:  To assess prior glycemic control    Signed and Held   Signed and Held  Procalcitonin  STAT,   R     Signed and Held   Signed and Held  Culture, sputum-assessment  Once,   R    Question:  Patient immune status  Answer:  Normal   Signed and Held   Signed and Held  Gram stain  Once,   R    Question:  Patient immune status  Answer:  Normal   Signed and Held   Signed and Held  Strep pneumoniae urinary antigen  Once,   R     Signed and Held   Signed and Held  Magnesium  Tomorrow morning,   R    Comments:  Call MD if <1.5    Signed and Held   Signed and Held  Phosphorus  Tomorrow morning,   R     Signed and Held   Signed and Held  TSH  Once,   R    Comments:  Cancel if already done within 1 month and notify MD    Signed and Held   Signed and Held  Comprehensive metabolic panel  Once,   R     Comments:  Cal MD for K<3.5 or >5.0    Signed and Held   Signed and Held  CBC  Once,   R    Comments:  Call for hg <8.0    Signed and Held      Vitals/Pain Today's Vitals   05/09/17 2230 05/09/17 2243 05/09/17 2300 05/09/17 2318  BP: (!) 140/50 (!) 148/69 139/63 140/61  Pulse: 95 88 88 88  Resp: (!) _0 Temp:      TempSrc:      SpO2: 97% 98% 94% 97%  Weight:      Height:      PainSc:        Isolation Precautions No active isolations  Medications Medications  acetaminophen (TYLENOL) tablet 1,000 mg (1,000 mg Oral Given 05/09/17 2019)  levofloxacin (LEVAQUIN) IVPB 750 mg (0 mg Intravenous Stopped 05/09/17 2158)  vancomycin (VANCOCIN) IVPB 1000 mg/200 mL premix (0 mg Intravenous Stopped 05/09/17 2333)  sodium chloride 0.9 % bolus 1,000 mL (0 mLs Intravenous Stopped 05/09/17 2143)    Mobility walks

## 2017-05-09 NOTE — ED Notes (Signed)
Second set of blood cultures collected °

## 2017-05-10 LAB — RETICULOCYTES
RBC.: 2.93 MIL/uL — AB (ref 3.87–5.11)
RETIC COUNT ABSOLUTE: 32.2 10*3/uL (ref 19.0–186.0)
RETIC CT PCT: 1.1 % (ref 0.4–3.1)

## 2017-05-10 LAB — IRON AND TIBC
IRON: 23 ug/dL — AB (ref 28–170)
Saturation Ratios: 14 % (ref 10.4–31.8)
TIBC: 161 ug/dL — ABNORMAL LOW (ref 250–450)
UIBC: 138 ug/dL

## 2017-05-10 LAB — CBC
HEMATOCRIT: 27.2 % — AB (ref 36.0–46.0)
Hemoglobin: 8.9 g/dL — ABNORMAL LOW (ref 12.0–15.0)
MCH: 30.4 pg (ref 26.0–34.0)
MCHC: 32.7 g/dL (ref 30.0–36.0)
MCV: 92.8 fL (ref 78.0–100.0)
PLATELETS: 261 10*3/uL (ref 150–400)
RBC: 2.93 MIL/uL — AB (ref 3.87–5.11)
RDW: 13.8 % (ref 11.5–15.5)
WBC: 17 10*3/uL — AB (ref 4.0–10.5)

## 2017-05-10 LAB — HEMOGLOBIN A1C
HEMOGLOBIN A1C: 5.6 % (ref 4.8–5.6)
MEAN PLASMA GLUCOSE: 114.02 mg/dL

## 2017-05-10 LAB — COMPREHENSIVE METABOLIC PANEL
ALT: 33 U/L (ref 14–54)
AST: 40 U/L (ref 15–41)
Albumin: 2.6 g/dL — ABNORMAL LOW (ref 3.5–5.0)
Alkaline Phosphatase: 80 U/L (ref 38–126)
Anion gap: 12 (ref 5–15)
BILIRUBIN TOTAL: 0.9 mg/dL (ref 0.3–1.2)
BUN: 46 mg/dL — AB (ref 6–20)
CO2: 20 mmol/L — ABNORMAL LOW (ref 22–32)
CREATININE: 3.08 mg/dL — AB (ref 0.44–1.00)
Calcium: 9.2 mg/dL (ref 8.9–10.3)
Chloride: 105 mmol/L (ref 101–111)
GFR, EST AFRICAN AMERICAN: 16 mL/min — AB (ref >60)
GFR, EST NON AFRICAN AMERICAN: 14 mL/min — AB (ref >60)
Glucose, Bld: 106 mg/dL — ABNORMAL HIGH (ref 65–99)
POTASSIUM: 4.8 mmol/L (ref 3.5–5.1)
Sodium: 137 mmol/L (ref 135–145)
TOTAL PROTEIN: 6.5 g/dL (ref 6.5–8.1)

## 2017-05-10 LAB — GLUCOSE, CAPILLARY
GLUCOSE-CAPILLARY: 101 mg/dL — AB (ref 65–99)
GLUCOSE-CAPILLARY: 96 mg/dL (ref 65–99)
Glucose-Capillary: 108 mg/dL — ABNORMAL HIGH (ref 65–99)
Glucose-Capillary: 82 mg/dL (ref 65–99)
Glucose-Capillary: 96 mg/dL (ref 65–99)

## 2017-05-10 LAB — EXPECTORATED SPUTUM ASSESSMENT W REFEX TO RESP CULTURE

## 2017-05-10 LAB — VITAMIN B12: Vitamin B-12: 206 pg/mL (ref 180–914)

## 2017-05-10 LAB — EXPECTORATED SPUTUM ASSESSMENT W GRAM STAIN, RFLX TO RESP C: Special Requests: NORMAL

## 2017-05-10 LAB — MAGNESIUM: Magnesium: 2.2 mg/dL (ref 1.7–2.4)

## 2017-05-10 LAB — MRSA PCR SCREENING: MRSA BY PCR: POSITIVE — AB

## 2017-05-10 LAB — HIV ANTIBODY (ROUTINE TESTING W REFLEX): HIV SCREEN 4TH GENERATION: NONREACTIVE

## 2017-05-10 LAB — PROCALCITONIN: PROCALCITONIN: 1.59 ng/mL

## 2017-05-10 LAB — CREATININE, URINE, RANDOM: Creatinine, Urine: 46.86 mg/dL

## 2017-05-10 LAB — TSH: TSH: 1.144 u[IU]/mL (ref 0.350–4.500)

## 2017-05-10 LAB — SODIUM, URINE, RANDOM: Sodium, Ur: 13 mmol/L

## 2017-05-10 LAB — PHOSPHORUS: PHOSPHORUS: 3.1 mg/dL (ref 2.5–4.6)

## 2017-05-10 LAB — FOLATE: Folate: 16.9 ng/mL (ref >5.9)

## 2017-05-10 LAB — FERRITIN: FERRITIN: 604 ng/mL — AB (ref 11–307)

## 2017-05-10 LAB — STREP PNEUMONIAE URINARY ANTIGEN: Strep Pneumo Urinary Antigen: NEGATIVE

## 2017-05-10 MED ORDER — SODIUM CHLORIDE 0.45 % IV SOLN
INTRAVENOUS | Status: DC
  Administered 2017-05-10 – 2017-05-11 (×3): via INTRAVENOUS

## 2017-05-10 MED ORDER — MUPIROCIN 2 % EX OINT
1.0000 "application " | TOPICAL_OINTMENT | Freq: Two times a day (BID) | CUTANEOUS | Status: DC
  Administered 2017-05-10 – 2017-05-13 (×7): 1 via NASAL
  Filled 2017-05-10 (×2): qty 22

## 2017-05-10 MED ORDER — CHLORHEXIDINE GLUCONATE CLOTH 2 % EX PADS
6.0000 | MEDICATED_PAD | Freq: Every day | CUTANEOUS | Status: DC
  Administered 2017-05-10 – 2017-05-13 (×3): 6 via TOPICAL

## 2017-05-10 MED ORDER — GUAIFENESIN-DM 100-10 MG/5ML PO SYRP: 5.0000 mL | ORAL_SOLUTION | ORAL | Status: DC | PRN

## 2017-05-10 MED ORDER — GUAIFENESIN-DM 100-10 MG/5ML PO SYRP
5.0000 mL | ORAL_SOLUTION | ORAL | Status: DC | PRN
  Administered 2017-05-10 – 2017-05-13 (×8): 5 mL via ORAL
  Filled 2017-05-10: qty 10
  Filled 2017-05-10 (×3): qty 5
  Filled 2017-05-10: qty 10
  Filled 2017-05-10 (×4): qty 5

## 2017-05-10 NOTE — Progress Notes (Signed)
Rx Brief note: Lovenox Wt=69 kg, CrCl~12 ml/min Rx adjusted Lovenox to 30 mg daily in pt with CrCl<30 Thanks Dorrene German 05/10/2017 12:07 AM

## 2017-05-10 NOTE — Plan of Care (Signed)
  Problem: Pain Managment: Goal: General experience of comfort will improve Outcome: Completed/Met

## 2017-05-10 NOTE — Progress Notes (Signed)
PROGRESS NOTE    Dana Jackson  YOV:[id number] DOB: 09/23/1945 DOA: 05/09/2017 PCP: [facility]   Brief Narrative:  Dana Jackson is a 72 year old female who presented for cough and shortness of breath for the past week and a 2 day history of altered mental status. Past medical history is significant for renal cell carcinoma (s/p surgery 2010), endometrial cancer (s/p surgery, chemo & radiation completed in 05/2016), CKD III, HTN, DMII, CAD (s/p CABG 2012), and TIA. Brought in by her husband when he noticed her symptoms were not improving and she began to have altered mentation. She was seen by her PCP on 4/26 who diagnosed her with viral URI and recommended OTC Mucinex, cough syrup and albuterol inhaler use. She continues to have these symptoms despite use of the above. Reports poor oral intake in the last couple of days. Denies sick contacts, denies chest pain, abdominal pain, nausea, vomiting or urinary symptoms. On admission temperature 100.2 F, blood pressure 110/74 mmHg, pulse 112, respirations 20, oxygen 86% on room air. Sodium 136, potassium 4.7, chloride 100, CO2 25, Glucose 98. BUN 50, Creatinine 3.61 (baseline 2.07-2.9), calcium 9.8, anion gap 11, alk phos 90, albumin 3.0, AST 39, ALT 33, total protein 7.3, GFR 12. FENa 0.74%, Lactic acid 0.95, procalcitonin 1.59, WBC 16.6, RBC 3.26, Hemoglobin 9.8, platelets 264. Troponin negative. Urinalysis negative for UTI and strep pneumo antigen. EKG sinus tachy. CXR showed right lower lobe infiltrates, possible pneumonia. Blood cultures pending.  Patient was admitted with a working diagnosis of sepsis secondary to pneumonia.   Assessment & Plan:   Active Problems:   Chronic kidney disease (CKD), stage IV (severe) (HCC)   Essential hypertension   Anemia   Hyperlipidemia LDL goal <70   Type 2 diabetes mellitus with renal manifestations, controlled ([place])   Acute-on-chronic kidney injury ([place])   CAD  (coronary artery disease) of artery bypass graft   Sepsis ([place])   CAP (community acquired pneumonia)   Acute encephalopathy   Sepsis secondary to community acquired pneumonia - Improved -Temperature 98.1 F, blood pressure 138/60 mmHg, Pulse 89, Respirations 20, Oxygen 91 % on 2L nasal cannula. -Continue Levaquin IV. -Continue albuterol nebulizer every two hours as needed. -Blood cultures pending. -Possible discharge tomorrow pending continued clinical improvement.  Acute on chronic kidney injury - improved -Serum creatinine 3.61 >>3.08 (baseline 2.07-2.9) -Avoid nephrotoxic agents. -Continue to monitor.  Acute encephalopathy -Improved -Likely multifactorial due to sepsis and mild dehydration. -Patient alert and oriented x 3, no signs of altered mentation. -Continue to monitor.  CAD -Continue aspirin, atorvastatin and metoprolol tartrate.  Essential hypertension -Continue metoprolol tartrate, avoid nephrotoxic agents.  Hyperlipidemia -Continue medications as above.  Type 2 diabetes mellitus -Continue sliding scale insulin. -Blood glucose  Anemia -No signs of pallor, tachycardia or bleeding, waiting on anemia panel.  DVT prophylaxis: Lovenox. Code Status: FULL. Family Communication: None at bedside. Disposition Plan: Home when clinically improved.   Consultants:   None.  Procedures:   None.  Antimicrobials:   Vancomycin 4/29 > 4/29  Levaquin 4/29>>   Subjective: Patient reports continuing to experience cough, wheezing and green mucus production, also reports mild lower leg edema. Denies shortness of breath, orthopnea, headache, lightheadedness, dizziness, nausea, vomiting, diarrhea. Reports feeling much improved since admission, no longer confused.  Objective: Vitals:   05/09/17 2318 05/10/17 0007 05/10/17 0600 05/10/17 0849  BP: 140/61 (!) 148/53 (!) 144/63 138/60  Pulse: 88 95 86 89  Resp: 20 18 20 20   Temp:  97.8 F (36.6 C) 98.3 F (36.8 C)  98.1 F (36.7 C)  TempSrc:  Oral Oral Oral  SpO2: 97% 92% 94% 91%  Weight:  71 kg (156 lb 8.4 oz)    Height:        Intake/Output Summary (Last 24 hours) at 05/10/2017 0921 Last data filed at 05/10/2017 0636 Gross per 24 hour  Intake 609.17 ml  Output -  Net 609.17 ml   Filed Weights   05/09/17 1854 05/09/17 1903 05/10/17 0007  Weight: 69.9 kg (154 lb) 69.9 kg (154 lb) 71 kg (156 lb 8.4 oz)    Examination:  General exam: Appears calm and comfortable  HEENT: Mucus membranes dry. Respiratory system: Distant crackles, no wheezing. Respiratory effort normal. Cardiovascular system: S1 & S2 heard, RRR. No JVD, murmurs, rubs, gallops or clicks. No pedal edema. Gastrointestinal system: Abdomen is nondistended, soft and nontender. Normal bowel sounds heard. Central nervous system: Alert and oriented. No focal neurological deficits. Extremities: Moves all four extremities equally, ambulatory. Skin: No rashes, lesions or ulcers. Psychiatry: Judgement and insight appear normal. Mood & affect appropriate.     Data Reviewed: I have personally reviewed following labs and imaging studies  CBC: Recent Labs  Lab 05/09/17 1944 05/10/17 0740  WBC 16.6* 17.0*  NEUTROABS 13.6*  --   HGB 9.8* 8.9*  HCT 29.9* 27.2*  MCV 91.7 92.8  PLT 264 161   Basic Metabolic Panel: Recent Labs  Lab 05/09/17 1944 05/10/17 0740  NA 136 137  K 4.7 4.8  CL 100* 105  CO2 25 20*  GLUCOSE 98 106*  BUN 50* 46*  CREATININE 3.61* 3.08*  CALCIUM 9.8 9.2  MG  --  2.2  PHOS  --  3.1   GFR: Estimated Creatinine Clearance: 14.5 mL/min (A) (by C-G formula based on SCr of 3.08 mg/dL (H)). Liver Function Tests: Recent Labs  Lab 05/09/17 1944 05/10/17 0740  AST 39 40  ALT 33 33  ALKPHOS 90 80  BILITOT 0.7 0.9  PROT 7.3 6.5  ALBUMIN 3.0* 2.6*   No results for input(s): LIPASE, AMYLASE in the last 168 hours. No results for input(s): AMMONIA in the last 168 hours. Coagulation Profile: No results  for input(s): INR, PROTIME in the last 168 hours. Cardiac Enzymes: No results for input(s): CKTOTAL, CKMB, CKMBINDEX, TROPONINI in the last 168 hours. BNP (last 3 results) No results for input(s): PROBNP in the last 8760 hours. HbA1C: No results for input(s): HGBA1C in the last 72 hours. CBG: Recent Labs  Lab 05/10/17 0044 05/10/17 0748  GLUCAP 96 101*   Lipid Profile: No results for input(s): CHOL, HDL, LDLCALC, TRIG, CHOLHDL, LDLDIRECT in the last 72 hours. Thyroid Function Tests: No results for input(s): TSH, T4TOTAL, FREET4, T3FREE, THYROIDAB in the last 72 hours. Anemia Panel: Recent Labs    05/10/17 0740  RETICCTPCT 1.1   Sepsis Labs: Recent Labs  Lab 05/09/17 2002 05/10/17 0015  PROCALCITON  --  1.59  LATICACIDVEN 0.95  --     Recent Results (from the past 240 hour(s))  MRSA PCR Screening     Status: Abnormal   Collection Time: 05/09/17  1:41 AM  Result Value Ref Range Status   MRSA by PCR POSITIVE (A) NEGATIVE Final    Comment:        The GeneXpert MRSA Assay (FDA approved for NASAL specimens only), is one component of a comprehensive MRSA colonization surveillance program. It is not intended to diagnose MRSA infection nor to guide or monitor  treatment for MRSA infections. RESULT CALLED TO, READ BACK BY AND VERIFIED WITH: H PENG,RN @0445  05/10/17 MKELLY Performed at [facility], [place] [address] [place], [place] [address]   Blood Culture (routine x 2)     Status: None (Preliminary result)   Collection Time: 05/09/17  7:35 PM  Result Value Ref Range Status   Specimen Description   Final    BLOOD RIGHT ANTECUBITAL Performed at [place] [address], [place], [place] [address]    Special Requests   Final    BOTTLES DRAWN AEROBIC AND ANAEROBIC Blood Culture results may not be optimal due to an excessive volume of blood received in culture bottles Performed at [place] [address] [place], [place] [address]    Culture   Final    NO GROWTH < 12 HOURS Performed at [place] [address], [place], [place] [address]    Report Status PENDING  Incomplete  Blood Culture (routine x 2)     Status: None (Preliminary result)   Collection Time: 05/09/17  7:44 PM  Result Value Ref Range Status   Specimen Description   Final    BLOOD BLOOD LEFT HAND Performed at [place] [address] [place], [place] [address]    Special Requests   Final    BOTTLES DRAWN AEROBIC AND ANAEROBIC Blood Culture adequate volume Performed at [place] [address], [place], [place] [address]    Culture   Final    NO GROWTH < 12 HOURS Performed at [place] [address], [place], [place] [address]    Report Status PENDING  Incomplete  Culture, sputum-assessment     Status: None   Collection Time: 05/10/17  6:55 AM  Result Value Ref Range Status   Specimen Description EXPECTORATED SPUTUM  Final   Special Requests Normal  Final   Sputum evaluation   Final    THIS SPECIMEN IS ACCEPTABLE FOR SPUTUM CULTURE Performed at [facility], [place] [address], [place],  [address]    Report Status 05/10/2017 FINAL  Final         Radiology Studies: Dg Chest 2 View  Result Date: 05/09/2017 CLINICAL DATA:  Productive cough EXAM: CHEST - 2 VIEW COMPARISON:  08/25/2016 FINDINGS: Surgical changes in the cervical spine. Right-sided central venous catheter tip overlies the cavoatrial region. Post sternotomy changes. Subsegmental atelectasis at the left base. Slightly nodular appearing airspace disease in the right mid and lower lung. Mild cardiomegaly with aortic atherosclerosis. No pneumothorax. IMPRESSION: 1. Slightly nodular appearing airspace disease in the right mid to lower lung, suspect pneumonia, possible atypical pneumonia. 2. Mild cardiomegaly 3. Subsegmental atelectasis at the left base. Electronically  Signed   By: Donavan Foil M.D.   On: 05/09/2017 19:54        Scheduled Meds: . aspirin EC  81 mg Oral QPM  . atorvastatin  40 mg Oral QHS  . Chlorhexidine Gluconate Cloth  6 each Topical Q0600  . enoxaparin (LOVENOX) injection  30 mg Subcutaneous QHS  . famotidine  10 mg Oral Daily  . gabapentin  100 mg Oral TID  . guaiFENesin  600 mg Oral BID  . insulin aspart  0-5 Units Subcutaneous QHS  . insulin aspart  0-9 Units Subcutaneous TID WC  . isosorbide mononitrate  30 mg Oral Daily  . loratadine  10 mg Oral Daily  . metoprolol tartrate  50 mg Oral BID  . mupirocin ointment  1 application Nasal BID  . pantoprazole  40 mg Oral Daily   Continuous Infusions: . sodium chloride 50 mL/hr at 05/10/17 0101  . [START ON 05/11/2017] levofloxacin (LEVAQUIN) IV       LOS: 1 day    Time spent: 25 minutes.   Eloy End, PA-S [facility]  If 7PM-7AM, please contact night-coverage [email or web address] Password [email or web address] 05/10/2017, 9:21 AM

## 2017-05-10 NOTE — Plan of Care (Signed)
  Problem: Pain Managment: Goal: General experience of comfort will improve Outcome: Progressing   

## 2017-05-10 NOTE — Progress Notes (Signed)
PROGRESS NOTE    Dana Jackson  TDS:[id number] DOB: May 09, 1945 DOA: 05/09/2017 PCP: [facility]    Brief Narrative:  72 year old female who presented with cough and altered mental status.  She does have the significant past medical history for coronary artery disease, chronic kidney disease and hypertension.  Patient had worsening symptoms for the last 7 days prior to hospitalization, she was diagnosed initially as an outpatient with a viral upper respiratory tract infection, she had worsening symptoms consistent with cough, decreased p.o. intake and altered mentation.  On the initial physical examination her oximetry saturation was 86% on room air, blood pressure 110/74, heart rate 112, respiratory 20, temperature 100.2.  Dry mucous membranes, heart S1-S2 present and rhythmic, lungs with distant breath sounds, no wheezing, positive rales, the abdomen was soft nontender, no lower extremity edema.  Sodium 136, potassium 4.7, chloride 100, bicarb 25, glucose 98, BUN 50, creatinine 3.61, white cell count 16.6, hemoglobin 9.8, hematocrit 29.9, platelets 264.  Urine analysis with 100 protein, specific gravity 1.014, red cells 0-5, white cells 6-10.  Urine sodium less than 1013.  Chest x-ray with alveolar infiltrate at the right base.  EKG sinus rhythm, normal axis, normal intervals.  Patient was admitted to the hospital with a working diagnosis of right lower lobe community-acquired pneumonia complicated by acute kidney injury on chronic kidney disease IV.   Assessment & Plan:   Active Problems:   Chronic kidney disease (CKD), stage IV (severe) (Dana Jackson)   Essential hypertension   Anemia   Hyperlipidemia LDL goal <70   Type 2 diabetes mellitus with renal manifestations, controlled (Dana Jackson)   Acute-on-chronic kidney injury (Dana Jackson)   CAD (coronary artery disease) of artery bypass graft   Sepsis (Dana Jackson)   CAP (community acquired pneumonia)   Acute encephalopathy   1.  Right  lower lobe pneumonia, community-acquired, present on admission, complicated with sepsis. Will continue oxymetry monitoring and supplemental 02 per [facility], continue antibiotic therapy with IV Levofloxacin, wbc at 17 from 16.6, continue to follow up on blood cultures and temperature curve. Urinary streptococcal pneumonia antigen negative. Check respiratory viral panel and urinary Legionella antigen. Add cough suppressive therapy.   2.  Acute hypoxic respiratory failure due to right lower lobe pneumonia. Continue oxymetry monitoring, supplemental 02 per  to target 02 saturation greater than 92%, continue bronchodilator therapy.   3.  Acute kidney injury chronic kidney disease stage IV. Old records personally reviewed, base cr at 2,0 with eGFR 24, consistent with CKD stage 4.  Urinary Na low suggesting pre-renal, will continue hydration with half normal saline at 75 ml per hour, follow on renal panel in am. Serum K at 4,8 with serum bicarbonate at 20 with Na at 137 and Cl at 105.   4.  Type 2 diabetes mellitus. Continue glucose cover and monitoring with insulin sliding scale, tolerating po well.   5.  Coronary artery disease. No active chest pain.   6.  Dyslipidemia. Continue statin therapy.   7.  Hypertension. Continue blood pressure control with isosorbide and metoprolol. Systolic blood pressure 262 to 140 mmHg.    DVT prophylaxis: enoxaparin   Code Status:  full Family Communication: I spoke with patient's husband at the bedside and all questions were addressed.  Disposition Plan:  Home in 24 to 48 hours.    Consultants:     Procedures:     Antimicrobials:   Levofloxacin     Subjective: Patient is feeling better, continue to improved dyspnea but not back  to baseline, persistent cough, no nausea or vomiting.   Objective: Vitals:   05/09/17 2318 05/10/17 0007 05/10/17 0600 05/10/17 0849  BP: 140/61 (!) 148/53 (!) 144/63 138/60  Pulse: 88 95 86 89  Resp: _0 Temp:  97.8  F (36.6 C) 98.3 F (36.8 C) 98.1 F (36.7 C)  TempSrc:  Oral Oral Oral  SpO2: 97% 92% 94% 91%  Weight:  71 kg (156 lb 8.4 oz)    Height:        Intake/Output Summary (Last 24 hours) at 05/10/2017 1229 Last data filed at 05/10/2017 0636 Gross per 24 hour  Intake 609.17 ml  Output -  Net 609.17 ml   Filed Weights   05/09/17 1854 05/09/17 1903 05/10/17 0007  Weight: 69.9 kg (154 lb) 69.9 kg (154 lb) 71 kg (156 lb 8.4 oz)    Examination:   General: Not in pain or dyspnea. Deconditioned  Neurology: Awake and alert, non focal  E SEG:BTDV pallor, no icterus, oral mucosa moist Cardiovascular: No JVD. S1-S2 present, rhythmic, no gallops, rubs, or murmurs. Trace non pitting lower extremity edema. Pulmonary: decreased breath sounds at the right base, poor air movement, no wheezing, rhonchi positive right base rales. Gastrointestinal. Abdomen with no organomegaly, non tender, no rebound or guarding Skin. No rashes Musculoskeletal: no joint deformities     Data Reviewed: I have personally reviewed following labs and imaging studies  CBC: Recent Labs  Lab 05/09/17 1944 05/10/17 0740  WBC 16.6* 17.0*  NEUTROABS 13.6*  --   HGB 9.8* 8.9*  HCT 29.9* 27.2*  MCV 91.7 92.8  PLT 264 761   Basic Metabolic Panel: Recent Labs  Lab 05/09/17 1944 05/10/17 0740  NA 136 137  K 4.7 4.8  CL 100* 105  CO2 25 20*  GLUCOSE 98 106*  BUN 50* 46*  CREATININE 3.61* 3.08*  CALCIUM 9.8 9.2  MG  --  2.2  PHOS  --  3.1   GFR: Estimated Creatinine Clearance: 14.5 mL/min (A) (by C-G formula based on SCr of 3.08 mg/dL (H)). Liver Function Tests: Recent Labs  Lab 05/09/17 1944 05/10/17 0740  AST 39 40  ALT 33 33  ALKPHOS 90 80  BILITOT 0.7 0.9  PROT 7.3 6.5  ALBUMIN 3.0* 2.6*   No results for input(s): LIPASE, AMYLASE in the last 168 hours. No results for input(s): AMMONIA in the last 168 hours. Coagulation Profile: No results for input(s): INR, PROTIME in the last 168  hours. Cardiac Enzymes: No results for input(s): CKTOTAL, CKMB, CKMBINDEX, TROPONINI in the last 168 hours. BNP (last 3 results) No results for input(s): PROBNP in the last 8760 hours. HbA1C: Recent Labs    05/10/17 0740  HGBA1C 5.6   CBG: Recent Labs  Lab 05/10/17 0044 05/10/17 0748 05/10/17 1215  GLUCAP 96 101* 82   Lipid Profile: No results for input(s): CHOL, HDL, LDLCALC, TRIG, CHOLHDL, LDLDIRECT in the last 72 hours. Thyroid Function Tests: No results for input(s): TSH, T4TOTAL, FREET4, T3FREE, THYROIDAB in the last 72 hours. Anemia Panel: Recent Labs    05/10/17 0740  VITAMINB12 206  FOLATE 16.9  FERRITIN 604*  TIBC 161*  IRON 23*  RETICCTPCT 1.1      Radiology Studies: I have reviewed all of the imaging during this hospital visit personally     Scheduled Meds: . aspirin EC  81 mg Oral QPM  . atorvastatin  40 mg Oral QHS  . Chlorhexidine Gluconate Cloth  6 each Topical Q0600  .  enoxaparin (LOVENOX) injection  30 mg Subcutaneous QHS  . famotidine  10 mg Oral Daily  . gabapentin  100 mg Oral TID  . guaiFENesin  600 mg Oral BID  . insulin aspart  0-5 Units Subcutaneous QHS  . insulin aspart  0-9 Units Subcutaneous TID WC  . isosorbide mononitrate  30 mg Oral Daily  . loratadine  10 mg Oral Daily  . metoprolol tartrate  50 mg Oral BID  . mupirocin ointment  1 application Nasal BID  . pantoprazole  40 mg Oral Daily   Continuous Infusions: . [START ON 05/11/2017] levofloxacin (LEVAQUIN) IV       LOS: 1 day        Tawni Millers, MD [facility] Pager [phone]

## 2017-05-11 DIAGNOSIS — I10 Essential (primary) hypertension: Secondary | ICD-10-CM

## 2017-05-11 DIAGNOSIS — A419 Sepsis, unspecified organism: Principal | ICD-10-CM

## 2017-05-11 DIAGNOSIS — N184 Chronic kidney disease, stage 4 (severe): Secondary | ICD-10-CM

## 2017-05-11 DIAGNOSIS — E785 Hyperlipidemia, unspecified: Secondary | ICD-10-CM

## 2017-05-11 DIAGNOSIS — N182 Chronic kidney disease, stage 2 (mild): Secondary | ICD-10-CM

## 2017-05-11 DIAGNOSIS — E1122 Type 2 diabetes mellitus with diabetic chronic kidney disease: Secondary | ICD-10-CM

## 2017-05-11 DIAGNOSIS — J181 Lobar pneumonia, unspecified organism: Secondary | ICD-10-CM

## 2017-05-11 DIAGNOSIS — I25708 Atherosclerosis of coronary artery bypass graft(s), unspecified, with other forms of angina pectoris: Secondary | ICD-10-CM

## 2017-05-11 DIAGNOSIS — N179 Acute kidney failure, unspecified: Secondary | ICD-10-CM

## 2017-05-11 LAB — BASIC METABOLIC PANEL
Anion gap: 11 (ref 5–15)
BUN: 44 mg/dL — AB (ref 6–20)
CHLORIDE: 106 mmol/L (ref 101–111)
CO2: 20 mmol/L — ABNORMAL LOW (ref 22–32)
CREATININE: 2.87 mg/dL — AB (ref 0.44–1.00)
Calcium: 9.5 mg/dL (ref 8.9–10.3)
GFR calc Af Amer: 18 mL/min — ABNORMAL LOW (ref >60)
GFR calc non Af Amer: 15 mL/min — ABNORMAL LOW (ref >60)
Glucose, Bld: 117 mg/dL — ABNORMAL HIGH (ref 65–99)
Potassium: 4.8 mmol/L (ref 3.5–5.1)
SODIUM: 137 mmol/L (ref 135–145)

## 2017-05-11 LAB — CBC WITH DIFFERENTIAL/PLATELET
Basophils Absolute: 0 10*3/uL (ref 0.0–0.1)
Basophils Relative: 0 %
EOS PCT: 2 %
Eosinophils Absolute: 0.3 10*3/uL (ref 0.0–0.7)
HCT: 26.6 % — ABNORMAL LOW (ref 36.0–46.0)
HEMOGLOBIN: 8.6 g/dL — AB (ref 12.0–15.0)
Lymphocytes Relative: 7 %
Lymphs Abs: 1 10*3/uL (ref 0.7–4.0)
MCH: 30.1 pg (ref 26.0–34.0)
MCHC: 32.3 g/dL (ref 30.0–36.0)
MCV: 93 fL (ref 78.0–100.0)
MONOS PCT: 8 %
Monocytes Absolute: 1.2 10*3/uL — ABNORMAL HIGH (ref 0.1–1.0)
NEUTROS PCT: 83 %
Neutro Abs: 11.9 10*3/uL — ABNORMAL HIGH (ref 1.7–7.7)
PLATELETS: 260 10*3/uL (ref 150–400)
RBC: 2.86 MIL/uL — ABNORMAL LOW (ref 3.87–5.11)
RDW: 13.8 % (ref 11.5–15.5)
WBC: 14.4 10*3/uL — AB (ref 4.0–10.5)

## 2017-05-11 LAB — RESPIRATORY PANEL BY PCR
Adenovirus: NOT DETECTED
BORDETELLA PERTUSSIS-RVPCR: NOT DETECTED
CHLAMYDOPHILA PNEUMONIAE-RVPPCR: NOT DETECTED
CORONAVIRUS 229E-RVPPCR: NOT DETECTED
CORONAVIRUS HKU1-RVPPCR: NOT DETECTED
Coronavirus NL63: NOT DETECTED
Coronavirus OC43: NOT DETECTED
INFLUENZA B-RVPPCR: NOT DETECTED
Influenza A: NOT DETECTED
Metapneumovirus: NOT DETECTED
Mycoplasma pneumoniae: NOT DETECTED
PARAINFLUENZA VIRUS 2-RVPPCR: NOT DETECTED
Parainfluenza Virus 1: NOT DETECTED
Parainfluenza Virus 3: NOT DETECTED
Parainfluenza Virus 4: NOT DETECTED
Respiratory Syncytial Virus: NOT DETECTED
Rhinovirus / Enterovirus: NOT DETECTED

## 2017-05-11 LAB — GLUCOSE, CAPILLARY
GLUCOSE-CAPILLARY: 116 mg/dL — AB (ref 65–99)
GLUCOSE-CAPILLARY: 122 mg/dL — AB (ref 65–99)
Glucose-Capillary: 165 mg/dL — ABNORMAL HIGH (ref 65–99)
Glucose-Capillary: 108 mg/dL — ABNORMAL HIGH (ref 65–99)

## 2017-05-11 NOTE — Progress Notes (Addendum)
PROGRESS NOTE  Dana Jackson UJW:[id number] DOB: 1945-08-09 DOA: 05/09/2017 PCP: [facility]  HPI/Recap of past 78 hours: 72 year old female who presented with cough and altered mental status.  She does have the significant past medical history for coronary artery disease, chronic kidney disease and hypertension.  Patient had worsening symptoms for the last 7 days prior to hospitalization, she was diagnosed initially as an outpatient with a viral upper respiratory tract infection, she had worsening symptoms consistent with cough, decreased p.o. intake and altered mentation. In the ED, Chest x-ray with alveolar infiltrate at the right base. Patient admitted for CAP complicated by acute kidney injury on chronic kidney disease IV.  Today, pt reported feeling much better, denies any new complains, denies any chest pain, worsening SOB, fever/chills. Encouraged to ambulate, RN informed.   Assessment/Plan: Active Problems:   Chronic kidney disease (CKD), stage IV (severe) ([facility])   Essential hypertension   Anemia   Hyperlipidemia LDL goal <70   Type 2 diabetes mellitus with renal manifestations, controlled ([place])   Acute-on-chronic kidney injury ([place])   CAD (coronary artery disease) of artery bypass graft   Sepsis ([place])   CAP (community acquired pneumonia)   Acute encephalopathy  Sepsis 2/2 to CAP Improving Afebrile with resolving leukocytosis  Respiratory panel neg, urine strep pneumo negative Legionella urine pending Procalcitonin 1.59, will trend CXR with RLL pneumonia Continue IV Levofloxacin  Supplemental O2 prn, duonebs prn  Acute hypoxic respiratory failure 2/2 CAP Improved, no longer requiring O2 Management as above  AKI on CKD stage IV Baseline cr at 2,0 with eGFR 24 Urinary Na low suggesting pre-renal Continue IVF, monitor closely  Type 2 diabetes mellitus Controlled, A1c 5.6 Continue SSI with accu-checks  Anemia of chronic kidney  disease Hgb at baseline around 8.4 Monitor  Coronary artery disease No active chest pain Continue ASA, imdur, BB, statins  Dyslipidemia Continue statin therapy  Hypertension Continue isosorbide and metoprolol      Code Status: Full  Family Communication: None at bedside  Disposition Plan: Home by 05/12/2017 pending improvement in creatinine   Consultants:  None  Procedures:  None  Antimicrobials:  Levofloxacin  DVT prophylaxis: Lovenox   Objective: Vitals:   05/10/17 2300 05/11/17 0106 05/11/17 0500 05/11/17 0829  BP: (!) 155/76 (!) 151/76 (!) 169/85   Pulse: 84 80 80   Resp: (!) 22 (!) 24 20   Temp: 98.6 F (37 C) 97.8 F (36.6 C) 98.2 F (36.8 C) 97.7 F (36.5 C)  TempSrc: Oral Oral Oral Oral  SpO2: 96% 92%  95%  Weight:      Height:        Intake/Output Summary (Last 24 hours) at 05/11/2017 1218 Last data filed at 05/10/2017 2200 Gross per 24 hour  Intake 1001.25 ml  Output -  Net 1001.25 ml   Filed Weights   05/09/17 1854 05/09/17 1903 05/10/17 0007  Weight: 69.9 kg (154 lb) 69.9 kg (154 lb) 71 kg (156 lb 8.4 oz)    Exam:   General: NAD  Cardiovascular: S1, S2 present  Respiratory: Diminished breath sounds bilaterally  Abdomen: Soft, nontender, nondistended, bowel sounds present  Musculoskeletal: No pedal edema bilaterally  Skin: Normal  Psychiatry: Normal mood   Data Reviewed: CBC: Recent Labs  Lab 05/09/17 1944 05/10/17 0740 05/11/17 0705  WBC 16.6* 17.0* 14.4*  NEUTROABS 13.6*  --  11.9*  HGB 9.8* 8.9* 8.6*  HCT 29.9* 27.2* 26.6*  MCV 91.7 92.8 93.0  PLT 264 261 260  Basic Metabolic Panel: Recent Labs  Lab 05/09/17 1944 05/10/17 0740 05/11/17 0705  NA 136 137 137  K 4.7 4.8 4.8  CL 100* 105 106  CO2 25 20* 20*  GLUCOSE 98 106* 117*  BUN 50* 46* 44*  CREATININE 3.61* 3.08* 2.87*  CALCIUM 9.8 9.2 9.5  MG  --  2.2  --   PHOS  --  3.1  --    GFR: Estimated Creatinine Clearance: 15.6 mL/min (A)  (by C-G formula based on SCr of 2.87 mg/dL (H)). Liver Function Tests: Recent Labs  Lab 05/09/17 1944 05/10/17 0740  AST 39 40  ALT 33 33  ALKPHOS 90 80  BILITOT 0.7 0.9  PROT 7.3 6.5  ALBUMIN 3.0* 2.6*   No results for input(s): LIPASE, AMYLASE in the last 168 hours. No results for input(s): AMMONIA in the last 168 hours. Coagulation Profile: No results for input(s): INR, PROTIME in the last 168 hours. Cardiac Enzymes: No results for input(s): CKTOTAL, CKMB, CKMBINDEX, TROPONINI in the last 168 hours. BNP (last 3 results) No results for input(s): PROBNP in the last 8760 hours. HbA1C: Recent Labs    05/10/17 0740  HGBA1C 5.6   CBG: Recent Labs  Lab 05/10/17 0748 05/10/17 1215 05/10/17 1607 05/10/17 2232 05/11/17 0724  GLUCAP 101* 82 96 108* 116*   Lipid Profile: No results for input(s): CHOL, HDL, LDLCALC, TRIG, CHOLHDL, LDLDIRECT in the last 72 hours. Thyroid Function Tests: Recent Labs    05/10/17 0740  TSH 1.144   Anemia Panel: Recent Labs    05/10/17 0740  VITAMINB12 206  FOLATE 16.9  FERRITIN 604*  TIBC 161*  IRON 23*  RETICCTPCT 1.1   Urine analysis:    Component Value Date/Time   COLORURINE YELLOW 05/09/2017 2202   APPEARANCEUR HAZY (A) 05/09/2017 2202   LABSPEC 1.014 05/09/2017 2202   PHURINE 5.0 05/09/2017 2202   GLUCOSEU NEGATIVE 05/09/2017 2202   HGBUR SMALL (A) 05/09/2017 2202   BILIRUBINUR NEGATIVE 05/09/2017 2202   KETONESUR NEGATIVE 05/09/2017 2202   PROTEINUR 100 (A) 05/09/2017 2202   UROBILINOGEN 0.2 06/10/2014 1259   NITRITE NEGATIVE 05/09/2017 2202   LEUKOCYTESUR TRACE (A) 05/09/2017 2202   Sepsis Labs: _0 (procalcitonin:4,lacticidven:4)  ) Recent Results (from the past 240 hour(s))  MRSA PCR Screening     Status: Abnormal   Collection Time: 05/09/17  1:41 AM  Result Value Ref Range Status   MRSA by PCR POSITIVE (A) NEGATIVE Final    Comment:        The GeneXpert MRSA Assay (FDA approved for NASAL  specimens only), is one component of a comprehensive MRSA colonization surveillance program. It is not intended to diagnose MRSA infection nor to guide or monitor treatment for MRSA infections. RESULT CALLED TO, READ BACK BY AND VERIFIED WITH: H PENG,RN _1  05/10/17 MKELLY Performed at [facility], [place] [address] [place], [place] [address]   Blood Culture (routine x 2)     Status: None (Preliminary result)   Collection Time: 05/09/17  7:35 PM  Result Value Ref Range Status   Specimen Description   Final    BLOOD RIGHT ANTECUBITAL Performed at [place] [address], [place], [place] [address]    Special Requests   Final    BOTTLES DRAWN AEROBIC AND ANAEROBIC Blood Culture results may not be optimal due to an excessive volume of blood received in culture bottles Performed at [place] [address] [place], [place] [address]  Culture   Final    NO GROWTH < 12 HOURS Performed at [facility], [place] [address] [place], [place] [address]    Report Status PENDING  Incomplete  Blood Culture (routine x 2)     Status: None (Preliminary result)   Collection Time: 05/09/17  7:44 PM  Result Value Ref Range Status   Specimen Description   Final    BLOOD BLOOD LEFT HAND Performed at [place] [address] [place], [place] [address]    Special Requests   Final    BOTTLES DRAWN AEROBIC AND ANAEROBIC Blood Culture adequate volume Performed at [place] [address] [place], [place] [address]    Culture   Final    NO GROWTH < 12 HOURS Performed at [place] [address], [place], [place] [address]    Report Status PENDING  Incomplete  Culture, sputum-assessment     Status: None   Collection Time: 05/10/17  6:55 AM  Result Value Ref Range Status   Specimen Description EXPECTORATED SPUTUM  Final   Special Requests Normal  Final   Sputum  evaluation   Final    THIS SPECIMEN IS ACCEPTABLE FOR SPUTUM CULTURE Performed at [facility], [place] [address] [place], [place] [address]    Report Status 05/10/2017 FINAL  Final  Culture, respiratory (NON-Expectorated)     Status: None (Preliminary result)   Collection Time: 05/10/17  6:55 AM  Result Value Ref Range Status   Specimen Description   Final    EXPECTORATED SPUTUM Performed at [facility] [address] [place], [place] [address]    Special Requests   Final    Normal Reflexed from [id number] Performed at [facility], [place] [address] [place], [place] [address]    Gram Stain   Final    RARE WBC PRESENT, PREDOMINANTLY PMN MODERATE GRAM POSITIVE COCCI    Culture   Final    CULTURE REINCUBATED FOR BETTER GROWTH Performed at [facility], [place] [address] [place], [place] [address]    Report Status PENDING  Incomplete  Respiratory Panel by PCR     Status: None   Collection Time: 05/10/17  4:00 PM  Result Value Ref Range Status   Adenovirus NOT DETECTED NOT DETECTED Final   Coronavirus 229E NOT DETECTED NOT DETECTED Final   Coronavirus HKU1 NOT DETECTED NOT DETECTED Final   Coronavirus NL63 NOT DETECTED NOT DETECTED Final   Coronavirus OC43 NOT DETECTED NOT DETECTED Final   Metapneumovirus NOT DETECTED NOT DETECTED Final   Rhinovirus / Enterovirus NOT DETECTED NOT DETECTED Final   Influenza A NOT DETECTED NOT DETECTED Final   Influenza B NOT DETECTED NOT DETECTED Final   Parainfluenza Virus 1 NOT DETECTED NOT DETECTED Final   Parainfluenza Virus 2 NOT DETECTED NOT DETECTED Final   Parainfluenza Virus 3 NOT DETECTED NOT DETECTED Final   Parainfluenza Virus 4 NOT DETECTED NOT DETECTED Final   Respiratory Syncytial Virus NOT DETECTED NOT DETECTED Final   Bordetella pertussis NOT DETECTED NOT DETECTED Final   Chlamydophila pneumoniae NOT DETECTED NOT DETECTED Final   Mycoplasma pneumoniae NOT DETECTED NOT  DETECTED Final    Comment: Performed at [facility], [place] [address], [place],  [address]      Studies: No results found.  Scheduled Meds: . aspirin EC  81 mg Oral QPM  . atorvastatin  40 mg Oral QHS  .  Chlorhexidine Gluconate Cloth  6 each Topical Q0600  . enoxaparin (LOVENOX) injection  30 mg Subcutaneous QHS  . famotidine  10 mg Oral Daily  . gabapentin  100 mg Oral TID  . insulin aspart  0-5 Units Subcutaneous QHS  . insulin aspart  0-9 Units Subcutaneous TID WC  . isosorbide mononitrate  30 mg Oral Daily  . loratadine  10 mg Oral Daily  . metoprolol tartrate  50 mg Oral BID  . mupirocin ointment  1 application Nasal BID  . pantoprazole  40 mg Oral Daily    Continuous Infusions: . sodium chloride 75 mL/hr at 05/11/17 0357  . levofloxacin (LEVAQUIN) IV       LOS: 2 days     Alma Friendly, MD [facility]   If 7PM-7AM, please contact night-coverage [email or web address] Password [email or web address] 05/11/2017, 12:18 PM

## 2017-05-12 ENCOUNTER — Inpatient Hospital Stay (HOSPITAL_COMMUNITY): Payer: Medicare Other

## 2017-05-12 LAB — CBC WITH DIFFERENTIAL/PLATELET
Basophils Absolute: 0 10*3/uL (ref 0.0–0.1)
Basophils Relative: 0 %
EOS PCT: 1 %
Eosinophils Absolute: 0.2 10*3/uL (ref 0.0–0.7)
HCT: 27.8 % — ABNORMAL LOW (ref 36.0–46.0)
Hemoglobin: 9.1 g/dL — ABNORMAL LOW (ref 12.0–15.0)
LYMPHS ABS: 1.4 10*3/uL (ref 0.7–4.0)
LYMPHS PCT: 9 %
MCH: 30.3 pg (ref 26.0–34.0)
MCHC: 32.7 g/dL (ref 30.0–36.0)
MCV: 92.7 fL (ref 78.0–100.0)
MONO ABS: 1.1 10*3/uL — AB (ref 0.1–1.0)
MONOS PCT: 7 %
Neutro Abs: 13.1 10*3/uL — ABNORMAL HIGH (ref 1.7–7.7)
Neutrophils Relative %: 83 %
Platelets: 343 10*3/uL (ref 150–400)
RBC: 3 MIL/uL — ABNORMAL LOW (ref 3.87–5.11)
RDW: 13.9 % (ref 11.5–15.5)
WBC: 15.8 10*3/uL — ABNORMAL HIGH (ref 4.0–10.5)

## 2017-05-12 LAB — BASIC METABOLIC PANEL
Anion gap: 10 (ref 5–15)
BUN: 41 mg/dL — AB (ref 6–20)
CALCIUM: 9.6 mg/dL (ref 8.9–10.3)
CO2: 21 mmol/L — ABNORMAL LOW (ref 22–32)
Chloride: 105 mmol/L (ref 101–111)
Creatinine, Ser: 2.7 mg/dL — ABNORMAL HIGH (ref 0.44–1.00)
GFR calc Af Amer: 19 mL/min — ABNORMAL LOW (ref >60)
GFR calc non Af Amer: 17 mL/min — ABNORMAL LOW (ref >60)
GLUCOSE: 146 mg/dL — AB (ref 65–99)
Potassium: 4.5 mmol/L (ref 3.5–5.1)
Sodium: 136 mmol/L (ref 135–145)

## 2017-05-12 LAB — GLUCOSE, CAPILLARY
GLUCOSE-CAPILLARY: 135 mg/dL — AB (ref 65–99)
GLUCOSE-CAPILLARY: 186 mg/dL — AB (ref 65–99)
Glucose-Capillary: 83 mg/dL (ref 65–99)
Glucose-Capillary: 218 mg/dL — ABNORMAL HIGH (ref 65–99)

## 2017-05-12 LAB — CULTURE, RESPIRATORY W GRAM STAIN: Special Requests: NORMAL

## 2017-05-12 LAB — LEGIONELLA PNEUMOPHILA SEROGP 1 UR AG: L. pneumophila Serogp 1 Ur Ag: NEGATIVE

## 2017-05-12 LAB — CULTURE, RESPIRATORY: CULTURE: NORMAL

## 2017-05-12 LAB — PROCALCITONIN: Procalcitonin: 0.6 ng/mL

## 2017-05-12 NOTE — Progress Notes (Signed)
PROGRESS NOTE  Dana Jackson YSA:[id number] DOB: 1945/11/24 DOA: 05/09/2017 PCP: [facility]  HPI/Recap of past 89 hours: 72 year old female who presented with cough and altered mental status.  She does have the significant past medical history for coronary artery disease, chronic kidney disease and hypertension.  Patient had worsening symptoms for the last 7 days prior to hospitalization, she was diagnosed initially as an outpatient with a viral upper respiratory tract infection, she had worsening symptoms consistent with cough, decreased p.o. intake and altered mentation. In the ED, Chest x-ray with alveolar infiltrate at the right base. Patient admitted for CAP complicated by acute kidney injury on chronic kidney disease IV.  Today, pt noted to be coughing a whole lot more, denies any chest pain, worsening SOB, fever/chills.   Assessment/Plan: Active Problems:   Chronic kidney disease (CKD), stage IV (severe) (HCC)   Essential hypertension   Anemia   Hyperlipidemia LDL goal <70   Type 2 diabetes mellitus with renal manifestations, controlled ([place])   Acute-on-chronic kidney injury ([place])   CAD (coronary artery disease) of artery bypass graft   Sepsis ([place])   CAP (community acquired pneumonia)   Acute encephalopathy  Sepsis 2/2 to CAP Afebrile with mild worsening leukocytosis  Respiratory panel neg, urine strep pneumo negative Legionella urine negative Procalcitonin 1.59-->0.60 CXR with RLL pneumonia Continue IV Levofloxacin  Supplemental O2 prn, duonebs prn  Acute hypoxic respiratory failure 2/2 CAP Improved, no longer requiring O2 Management as above  AKI on CKD stage IV Mild improvement Baseline cr at 2,0 with eGFR 24 Urinary Na low suggesting pre-renal D/C IVF, monitor closely  Type 2 diabetes mellitus Controlled, A1c 5.6 Continue SSI with accu-checks  Anemia of chronic kidney disease Hgb at baseline around  8.4 Monitor  Coronary artery disease No active chest pain Continue ASA, imdur, BB, statins  Dyslipidemia Continue statin therapy  Hypertension Continue isosorbide and metoprolol      Code Status: Full  Family Communication: None at bedside  Disposition Plan: Home by 05/13/2017 pending improvement in creatinine and WBC   Consultants:  None  Procedures:  None  Antimicrobials:  Levofloxacin  DVT prophylaxis: Lovenox   Objective: Vitals:   05/11/17 0829 05/11/17 1429 05/11/17 2110 05/12/17 1342  BP:  128/84 (!) 169/71 (!) 122/91  Pulse:   88 81  Resp:  16 18 18   Temp: 97.7 F (36.5 C) 98.1 F (36.7 C) 98 F (36.7 C) 98.7 F (37.1 C)  TempSrc: Oral Oral Oral Oral  SpO2: 95% 94% 93% 93%  Weight:      Height:        Intake/Output Summary (Last 24 hours) at 05/12/2017 1420 Last data filed at 05/12/2017 0617 Gross per 24 hour  Intake 1553.75 ml  Output 5 ml  Net 1548.75 ml   Filed Weights   05/09/17 1854 05/09/17 1903 05/10/17 0007  Weight: 69.9 kg (154 lb) 69.9 kg (154 lb) 71 kg (156 lb 8.4 oz)    Exam:   General: NAD  Cardiovascular: S1, S2 present  Respiratory: Diminished breath sounds bilaterally  Abdomen: Soft, nontender, nondistended, bowel sounds present  Musculoskeletal: No pedal edema bilaterally  Skin: Normal  Psychiatry: Normal mood   Data Reviewed: CBC: Recent Labs  Lab 05/09/17 1944 05/10/17 0740 05/11/17 0705 05/12/17 0621  WBC 16.6* 17.0* 14.4* 15.8*  NEUTROABS 13.6*  --  11.9* 13.1*  HGB 9.8* 8.9* 8.6* 9.1*  HCT 29.9* 27.2* 26.6* 27.8*  MCV 91.7 92.8 93.0 92.7  PLT 264  261 260 578   Basic Metabolic Panel: Recent Labs  Lab 05/09/17 1944 05/10/17 0740 05/11/17 0705 05/12/17 0621  NA 136 137 137 136  K 4.7 4.8 4.8 4.5  CL 100* 105 106 105  CO2 25 20* 20* 21*  GLUCOSE 98 106* 117* 146*  BUN 50* 46* 44* 41*  CREATININE 3.61* 3.08* 2.87* 2.70*  CALCIUM 9.8 9.2 9.5 9.6  MG  --  2.2  --   --   PHOS  --   3.1  --   --    GFR: Estimated Creatinine Clearance: 16.6 mL/min (A) (by C-G formula based on SCr of 2.7 mg/dL (H)). Liver Function Tests: Recent Labs  Lab 05/09/17 1944 05/10/17 0740  AST 39 40  ALT 33 33  ALKPHOS 90 80  BILITOT 0.7 0.9  PROT 7.3 6.5  ALBUMIN 3.0* 2.6*   No results for input(s): LIPASE, AMYLASE in the last 168 hours. No results for input(s): AMMONIA in the last 168 hours. Coagulation Profile: No results for input(s): INR, PROTIME in the last 168 hours. Cardiac Enzymes: No results for input(s): CKTOTAL, CKMB, CKMBINDEX, TROPONINI in the last 168 hours. BNP (last 3 results) No results for input(s): PROBNP in the last 8760 hours. HbA1C: Recent Labs    05/10/17 0740  HGBA1C 5.6   CBG: Recent Labs  Lab 05/11/17 1126 05/11/17 1754 05/11/17 2109 05/12/17 0711 05/12/17 1115  GLUCAP 165* 122* 108* 135* 186*   Lipid Profile: No results for input(s): CHOL, HDL, LDLCALC, TRIG, CHOLHDL, LDLDIRECT in the last 72 hours. Thyroid Function Tests: Recent Labs    05/10/17 0740  TSH 1.144   Anemia Panel: Recent Labs    05/10/17 0740  VITAMINB12 206  FOLATE 16.9  FERRITIN 604*  TIBC 161*  IRON 23*  RETICCTPCT 1.1   Urine analysis:    Component Value Date/Time   COLORURINE YELLOW 05/09/2017 2202   APPEARANCEUR HAZY (A) 05/09/2017 2202   LABSPEC 1.014 05/09/2017 2202   PHURINE 5.0 05/09/2017 2202   GLUCOSEU NEGATIVE 05/09/2017 2202   HGBUR SMALL (A) 05/09/2017 2202   BILIRUBINUR NEGATIVE 05/09/2017 2202   KETONESUR NEGATIVE 05/09/2017 2202   PROTEINUR 100 (A) 05/09/2017 2202   UROBILINOGEN 0.2 06/10/2014 1259   NITRITE NEGATIVE 05/09/2017 2202   LEUKOCYTESUR TRACE (A) 05/09/2017 2202   Sepsis Labs: '@LABRCNTIP'$ (procalcitonin:4,lacticidven:4)  ) Recent Results (from the past 240 hour(s))  MRSA PCR Screening     Status: Abnormal   Collection Time: 05/09/17  1:41 AM  Result Value Ref Range Status   MRSA by PCR POSITIVE (A) NEGATIVE Final     Comment:        The GeneXpert MRSA Assay (FDA approved for NASAL specimens only), is one component of a comprehensive MRSA colonization surveillance program. It is not intended to diagnose MRSA infection nor to guide or monitor treatment for MRSA infections. RESULT CALLED TO, READ BACK BY AND VERIFIED WITH: H PENG,RN '@[id number]'$  05/10/17 MKELLY Performed at [facility], [place] [address], [place], [place] [address]   Blood Culture (routine x 2)     Status: None (Preliminary result)   Collection Time: 05/09/17  7:35 PM  Result Value Ref Range Status   Specimen Description   Final    BLOOD RIGHT ANTECUBITAL Performed at [facility] [place] [address], [place], [place] [address]    Special Requests   Final    BOTTLES DRAWN AEROBIC AND ANAEROBIC Blood Culture results may not be optimal due to an excessive volume  of blood received in culture bottles Performed at [facility], [place] [address], [place], [place] [address]    Culture   Final    NO GROWTH 2 DAYS Performed at [place] [address] [place], [place] [address]    Report Status PENDING  Incomplete  Blood Culture (routine x 2)     Status: None (Preliminary result)   Collection Time: 05/09/17  7:44 PM  Result Value Ref Range Status   Specimen Description   Final    BLOOD BLOOD LEFT HAND Performed at [place] [address] [place], [place] [address]    Special Requests   Final    BOTTLES DRAWN AEROBIC AND ANAEROBIC Blood Culture adequate volume Performed at [place] [address] [place], [place] [address]    Culture   Final    NO GROWTH 2 DAYS Performed at [place] [address], [place], [place] [address]    Report Status PENDING  Incomplete  Culture, sputum-assessment     Status: None   Collection Time: 05/10/17  6:55 AM  Result Value Ref Range Status   Specimen Description EXPECTORATED SPUTUM   Final   Special Requests Normal  Final   Sputum evaluation   Final    THIS SPECIMEN IS ACCEPTABLE FOR SPUTUM CULTURE Performed at [facility], [place] [address], [place], [place] [address]    Report Status 05/10/2017 FINAL  Final  Culture, respiratory (NON-Expectorated)     Status: None   Collection Time: 05/10/17  6:55 AM  Result Value Ref Range Status   Specimen Description   Final    EXPECTORATED SPUTUM Performed at [facility], [place] [address] [place], [place] [address]    Special Requests   Final    Normal Reflexed from [id number] Performed at [facility], [place] [address], [place], [place] [address]    Gram Stain   Final    RARE WBC PRESENT, PREDOMINANTLY PMN MODERATE GRAM POSITIVE COCCI    Culture   Final    ABUNDANT Consistent with normal respiratory flora. Performed at [facility], [place] [address], [place], [place] [address]    Report Status 05/12/2017 FINAL  Final  Respiratory Panel by PCR     Status: None   Collection Time: 05/10/17  4:00 PM  Result Value Ref Range Status   Adenovirus NOT DETECTED NOT DETECTED Final   Coronavirus 229E NOT DETECTED NOT DETECTED Final   Coronavirus HKU1 NOT DETECTED NOT DETECTED Final   Coronavirus NL63 NOT DETECTED NOT DETECTED Final   Coronavirus OC43 NOT DETECTED NOT DETECTED Final   Metapneumovirus NOT DETECTED NOT DETECTED Final   Rhinovirus / Enterovirus NOT DETECTED NOT DETECTED Final   Influenza A NOT DETECTED NOT DETECTED Final   Influenza B NOT DETECTED NOT DETECTED Final   Parainfluenza Virus 1 NOT DETECTED NOT DETECTED Final   Parainfluenza Virus 2 NOT DETECTED NOT DETECTED Final   Parainfluenza Virus 3 NOT DETECTED NOT DETECTED Final   Parainfluenza Virus 4 NOT DETECTED NOT DETECTED Final   Respiratory Syncytial Virus NOT DETECTED NOT DETECTED Final   Bordetella pertussis NOT DETECTED NOT DETECTED Final   Chlamydophila pneumoniae NOT DETECTED NOT DETECTED Final    Mycoplasma pneumoniae NOT DETECTED NOT DETECTED Final    Comment: Performed at [facility], [place] [address] [place],  [address]      Studies: Dg Chest [place] 1  View  Result Date: 05/12/2017 CLINICAL DATA:  Worsening cough, short of breath EXAM: PORTABLE CHEST 1 VIEW COMPARISON:  Chest x-ray of 05/09/2017 FINDINGS: There is worsening airspace disease within the right lung base most consistent with pneumonia with probable linear atelectasis at the left lung base. Port-A-Cath remains with the tip overlying the lower SVC and cardiomegaly is stable. Median sternotomy sutures are noted from prior CABG. Anterior cervical spine fusion plate is noted. IMPRESSION: 1. Worsening of airspace disease particularly the right lung base most consistent with pneumonia. 2. Stable cardiomegaly with Port-A-Cath unchanged in position. Electronically Signed   By: Ivar Drape M.D.   On: 05/12/2017 12:12    Scheduled Meds: . aspirin EC  81 mg Oral QPM  . atorvastatin  40 mg Oral QHS  . Chlorhexidine Gluconate Cloth  6 each Topical Q0600  . enoxaparin (LOVENOX) injection  30 mg Subcutaneous QHS  . famotidine  10 mg Oral Daily  . gabapentin  100 mg Oral TID  . insulin aspart  0-5 Units Subcutaneous QHS  . insulin aspart  0-9 Units Subcutaneous TID WC  . isosorbide mononitrate  30 mg Oral Daily  . loratadine  10 mg Oral Daily  . metoprolol tartrate  50 mg Oral BID  . mupirocin ointment  1 application Nasal BID  . pantoprazole  40 mg Oral Daily    Continuous Infusions: . levofloxacin (LEVAQUIN) IV 500 mg (05/11/17 1723)     LOS: 3 days     Alma Friendly, MD [facility]   If 7PM-7AM, please contact night-coverage [email or web address] Password [email or web address] 05/12/2017, 2:20 PM

## 2017-05-13 DIAGNOSIS — G9340 Encephalopathy, unspecified: Secondary | ICD-10-CM

## 2017-05-13 DIAGNOSIS — D631 Anemia in chronic kidney disease: Secondary | ICD-10-CM

## 2017-05-13 LAB — BASIC METABOLIC PANEL
Anion gap: 8 (ref 5–15)
BUN: 42 mg/dL — AB (ref 6–20)
CO2: 22 mmol/L (ref 22–32)
CREATININE: 2.7 mg/dL — AB (ref 0.44–1.00)
Calcium: 9.5 mg/dL (ref 8.9–10.3)
Chloride: 110 mmol/L (ref 101–111)
GFR calc Af Amer: 19 mL/min — ABNORMAL LOW (ref >60)
GFR calc non Af Amer: 17 mL/min — ABNORMAL LOW (ref >60)
GLUCOSE: 131 mg/dL — AB (ref 65–99)
Potassium: 4.7 mmol/L (ref 3.5–5.1)
SODIUM: 140 mmol/L (ref 135–145)

## 2017-05-13 LAB — CBC WITH DIFFERENTIAL/PLATELET
Basophils Absolute: 0 10*3/uL (ref 0.0–0.1)
Basophils Relative: 0 %
EOS ABS: 0.2 10*3/uL (ref 0.0–0.7)
Eosinophils Relative: 2 %
HCT: 26.8 % — ABNORMAL LOW (ref 36.0–46.0)
Hemoglobin: 8.6 g/dL — ABNORMAL LOW (ref 12.0–15.0)
LYMPHS ABS: 1.2 10*3/uL (ref 0.7–4.0)
Lymphocytes Relative: 11 %
MCH: 29.9 pg (ref 26.0–34.0)
MCHC: 32.1 g/dL (ref 30.0–36.0)
MCV: 93.1 fL (ref 78.0–100.0)
MONOS PCT: 8 %
Monocytes Absolute: 0.9 10*3/uL (ref 0.1–1.0)
NEUTROS PCT: 79 %
Neutro Abs: 9 10*3/uL — ABNORMAL HIGH (ref 1.7–7.7)
Platelets: 321 10*3/uL (ref 150–400)
RBC: 2.88 MIL/uL — ABNORMAL LOW (ref 3.87–5.11)
RDW: 13.9 % (ref 11.5–15.5)
WBC: 11.3 10*3/uL — ABNORMAL HIGH (ref 4.0–10.5)

## 2017-05-13 LAB — GLUCOSE, CAPILLARY
Glucose-Capillary: 149 mg/dL — ABNORMAL HIGH (ref 65–99)
Glucose-Capillary: 201 mg/dL — ABNORMAL HIGH (ref 65–99)

## 2017-05-13 LAB — PROCALCITONIN: PROCALCITONIN: 0.4 ng/mL

## 2017-05-13 MED ORDER — LEVOFLOXACIN 500 MG PO TABS: 500.0000 mg | ORAL_TABLET | ORAL | 0 refills | Status: AC

## 2017-05-13 NOTE — Progress Notes (Signed)
Patient verbalized understanding of discharge instructions. Patient was stable at discharge. AVS was given to patient.

## 2017-05-13 NOTE — Progress Notes (Signed)
Pharmacy Antibiotic Note  Dana Jackson is a 72 y.o. female presented to the ED on 05/09/2017 with c/o cough.  CXR on 4/29 showed findings with suspicion for PNA.  Patient's currently on levaquin for PNA.  Today, 05/13/2017: - day #4 abx - afeb, wbc down  - scr stable at 2.70 (crcl~17) - 5/3 CXR: Worsening of airspace disease particularly the right lung base most consistent with pneumonia.   Plan: - continue levaquin 500 mg IV q48h - monitor renal function - f/u cx ______________________________________  Height: 5' (152.4 cm) Weight: 156 lb 8.4 oz (71 kg) IBW/kg (Calculated) : 45.5  Temp (24hrs), Avg:98.5 F (36.9 C), Min:98.2 F (36.8 C), Max:98.7 F (37.1 C)  Recent Labs  Lab 05/09/17 1944 05/09/17 2002 05/10/17 0740 05/11/17 0705 05/12/17 0621 05/13/17 0509  WBC 16.6*  --  17.0* 14.4* 15.8* 11.3*  CREATININE 3.61*  --  3.08* 2.87* 2.70* 2.70*  LATICACIDVEN  --  0.95  --   --   --   --     Estimated Creatinine Clearance: 16.6 mL/min (A) (by C-G formula based on SCr of 2.7 mg/dL (H)).    Allergies  Allergen Reactions  . Codeine Nausea Only  . Erythromycin Itching  . Lopid [Gemfibrozil] Itching  . Pravastatin Other (See Comments)    Used 01/2011 to 03/2011  . Simvastatin Other (See Comments)    Used 07/2007 to 11/2010 chg to pravastatin  . Sulfa Antibiotics     aki     Antimicrobials this admission: 4/29 Vanc 1gm x1  4/29 levaquin >>   Microbiology results: 4/29 BCx x2:  4/30 Sputum: normal flora FINAL 4/29 MRSA PCR: pos 4/29 Strep pneumo: neg  Thank you for allowing pharmacy to be a part of this patient's care.  Lynelle Doctor 05/13/2017 10:32 AM

## 2017-05-13 NOTE — Discharge Summary (Signed)
Discharge Summary  Dana Jackson YNW:[id number] DOB: 07-27-1945  PCP: [place], [place] date: 05/09/2017 Discharge date: 05/13/2017  Time spent: 40 mins   Recommendations for Outpatient Follow-up:  1. PCP  Discharge Diagnoses:  Active Hospital Problems   Diagnosis Date Noted  . Sepsis ([facility]) 05/09/2017  . CAP (community acquired pneumonia) 05/09/2017  . Acute encephalopathy 05/09/2017  . CAD (coronary artery disease) of artery bypass graft 07/29/2014  . Acute-on-chronic kidney injury ([place]) 07/29/2014  . Type 2 diabetes mellitus with renal manifestations, controlled ([place]) 07/10/2014  . Hyperlipidemia LDL goal <70   . Anemia 12/28/2010  . Chronic kidney disease (CKD), stage IV (severe) ([place]) 12/23/2010    Class: Diagnosis of  . Essential hypertension 12/23/2010    Class: History of    Resolved Hospital Problems  No resolved problems to display.    Discharge Condition: Stable  Diet recommendation: Heart healthy/mod carb  Vitals:   05/12/17 2031 05/13/17 0524  BP: (!) 142/65 136/80  Pulse: 96 79  Resp: 18 18  Temp: 98.7 F (37.1 C) 98.2 F (36.8 C)  SpO2: 94% 93%    History of present illness:  72 year old female who presented with cough and altered mental status. She does have thesignificant past medical history for coronary artery disease, chronic kidney disease and hypertension. Patient had worsening symptoms for the last 7 days prior to hospitalization, she was diagnosed initially as an outpatient with a viral upper respiratory tract infection,she had worsening symptoms consistent with cough,decreased p.o. intake and altered mentation. In the ED, Chest x-ray with alveolar infiltrate at the right base. Patient admitted for CAP complicated by acute kidney injury on chronic kidney diseaseIV.  Today, pt reported feeling better, still coughing, denies its worsening. Denies any chest pain, worsening SOB, fever/chills.   Hospital  Course:  Active Problems:   Chronic kidney disease (CKD), stage IV (severe) ([facility])   Essential hypertension   Anemia   Hyperlipidemia LDL goal <70   Type 2 diabetes mellitus with renal manifestations, controlled ([place])   Acute-on-chronic kidney injury ([place])   CAD (coronary artery disease) of artery bypass graft   Sepsis ([place])   CAP (community acquired pneumonia)   Acute encephalopathy  Sepsis 2/2 to CAP Afebrile with resolving leukocytosis: 17-->11.3 Respiratory panel neg, urine strep pneumo negative Legionella urine negative Procalcitonin 1.59-->0.60-->0.40 CXR with RLL pneumonia, repeat personally reviewed, still with RLL consolidation, no worsening noted Continue with PO Levofloxacin Q48H for a total of 7 days Inhalers prn, cough meds Follow up with PCP  Acute hypoxic respiratory failure 2/2 CAP Improved, no longer requiring O2 Management as above  AKI on CKD stage IV Improved Baseline cr at 2.9, now 2.7 Urinary Na low suggesting pre-renal S/P IVF, monitor closely PCP follow up closely  Type 2 diabetes mellitus Controlled, A1c 5.6 Continue SSI with accu-checks  Anemia of chronic kidney disease Hgb at baseline around 8.4 Monitor  Coronary artery disease No active chest pain Continue ASA, imdur, BB, statins  Dyslipidemia Continue statin therapy  Hypertension Continue isosorbide and metoprolol     Procedures:  None  Consultations:  None  Discharge Exam: BP 136/80 (BP Location: Left Arm)   Pulse 79   Temp 98.2 F (36.8 C) (Oral)   Resp 18   Ht 5' (1.524 m)   Wt 71 kg (156 lb 8.4 oz)   SpO2 93%   BMI 30.57 kg/m   General: NAD Cardiovascular: S1, S2   Respiratory: Diminished BS bilaterally, no wheezing noted  Discharge Instructions You were cared for by a hospitalist during your hospital stay. If you have any questions about your discharge medications or the care you received while you were in the hospital after you are discharged,  you can call the unit and asked to speak with the hospitalist on call if the hospitalist that took care of you is not available. Once you are discharged, your primary care physician will handle any further medical issues. Please note that NO REFILLS for any discharge medications will be authorized once you are discharged, as it is imperative that you return to your primary care physician (or establish a relationship with a primary care physician if you do not have one) for your aftercare needs so that they can reassess your need for medications and monitor your lab values.  Discharge Instructions    Diet - low sodium heart healthy   Complete by:  As directed    Increase activity slowly   Complete by:  As directed      Allergies as of 05/13/2017      Reactions   Codeine Nausea Only   Erythromycin Itching   Lopid [gemfibrozil] Itching   Pravastatin Other (See Comments)   Used 01/2011 to 03/2011   Simvastatin Other (See Comments)   Used 07/2007 to 11/2010 chg to pravastatin   Sulfa Antibiotics    aki      Medication List    TAKE these medications   ALIGN PO Take 1 capsule by mouth daily.   Alpha-Lipoic Acid 600 MG Caps Take 600 mg by mouth daily.   aspirin 81 MG tablet Take 81 mg by mouth every evening.   atorvastatin 40 MG tablet Commonly known as:  LIPITOR Take 40 mg by mouth at bedtime.   benzonatate 100 MG capsule Commonly known as:  TESSALON TAKE 1 CAPSULE (100 MG TOTAL) BY MOUTH 3 (THREE) TIMES DAILY AS NEEDED FOR UP TO 7 DAYS FOR COUGH.   calcitRIOL 0.25 MCG capsule Commonly known as:  ROCALTROL Take 0.25 mcg by mouth daily.   docusate sodium 100 MG capsule Commonly known as:  COLACE Take 100 mg by mouth daily as needed for mild constipation.   furosemide 20 MG tablet Commonly known as:  LASIX Take 20 mg by mouth every evening.   gabapentin 100 MG capsule Commonly known as:  NEURONTIN Take 100 mg by mouth 3 (three) times daily.   glipiZIDE 5 MG  tablet Commonly known as:  GLUCOTROL Take 2.5 mg by mouth 2 (two) times daily before a meal.   HYDROcodone-homatropine 5-1.5 MG/5ML syrup Commonly known as:  HYCODAN Take 5 mLs by mouth every 6 (six) hours as needed for cough.   isosorbide mononitrate 30 MG 24 hr tablet Commonly known as:  IMDUR TAKE 1 TABLET BY MOUTH EVERY DAY   levofloxacin 500 MG tablet Commonly known as:  LEVAQUIN Take 1 tablet (500 mg total) by mouth every other day for 1 dose. Start taking on:  05/15/2017   loperamide 2 MG capsule Commonly known as:  IMODIUM Take 2 mg by mouth 4 (four) times daily as needed for diarrhea or loose stools.   loratadine 10 MG tablet Commonly known as:  CLARITIN Take 10 mg by mouth daily.   magnesium oxide 400 MG tablet Commonly known as:  MAG-OX Take 400 mg by mouth daily.   metoprolol tartrate 50 MG tablet Commonly known as:  LOPRESSOR TAKE 1 TABLET TWICE DAILY   nitroGLYCERIN 0.4 MG SL tablet Commonly known as:  NITROSTAT PLACE 1 TABLET (0.4 MG TOTAL) UNDER THE TONGUE EVERY 5 (FIVE) MINUTES AS NEEDED FOR CHEST PAIN.   omeprazole 40 MG capsule Commonly known as:  PRILOSEC Take 40 mg by mouth daily.   ondansetron 4 MG tablet Commonly known as:  ZOFRAN Take 4 mg by mouth every 8 (eight) hours as needed for nausea or vomiting.   PROAIR HFA 108 (90 Base) MCG/ACT inhaler Generic drug:  albuterol Inhale 2 puffs into the lungs every 6 (six) hours as needed for wheezing or shortness of breath.   prochlorperazine 10 MG tablet Commonly known as:  COMPAZINE Take 10 mg by mouth every 6 (six) hours as needed for nausea or vomiting.   promethazine 25 MG tablet Commonly known as:  PHENERGAN Take 25 mg by mouth every 6 (six) hours as needed for nausea or vomiting.   ranitidine 300 MG tablet Commonly known as:  ZANTAC Take 1 tablet by mouth at bedtime.   VITAMIN B-6 PO Take 1 tablet by mouth daily.      Allergies  Allergen Reactions  . Codeine Nausea Only  .  Erythromycin Itching  . Lopid [Gemfibrozil] Itching  . Pravastatin Other (See Comments)    Used 01/2011 to 03/2011  . Simvastatin Other (See Comments)    Used 07/2007 to 11/2010 chg to pravastatin  . Sulfa Antibiotics     aki    Follow-up Information    [place], [facility]. Schedule an appointment as soon as possible for a visit in 1 week(s).   Specialty:  Family Medicine Contact information: [address] [place] [phone]            The results of significant diagnostics from this hospitalization (including imaging, microbiology, ancillary and laboratory) are listed below for reference.    Significant Diagnostic Studies: Dg Chest 2 View  Result Date: 05/09/2017 CLINICAL DATA:  Productive cough EXAM: CHEST - 2 VIEW COMPARISON:  08/25/2016 FINDINGS: Surgical changes in the cervical spine. Right-sided central venous catheter tip overlies the cavoatrial region. Post sternotomy changes. Subsegmental atelectasis at the left base. Slightly nodular appearing airspace disease in the right mid and lower lung. Mild cardiomegaly with aortic atherosclerosis. No pneumothorax. IMPRESSION: 1. Slightly nodular appearing airspace disease in the right mid to lower lung, suspect pneumonia, possible atypical pneumonia. 2. Mild cardiomegaly 3. Subsegmental atelectasis at the left base. Electronically Signed   By: Donavan Foil M.D.   On: 05/09/2017 19:54   Dg Chest Port 1 View  Result Date: 05/12/2017 CLINICAL DATA:  Worsening cough, short of breath EXAM: PORTABLE CHEST 1 VIEW COMPARISON:  Chest x-ray of 05/09/2017 FINDINGS: There is worsening airspace disease within the right lung base most consistent with pneumonia with probable linear atelectasis at the left lung base. Port-A-Cath remains with the tip overlying the lower SVC and cardiomegaly is stable. Median sternotomy sutures are noted from prior CABG. Anterior cervical spine fusion plate is noted.  IMPRESSION: 1. Worsening of airspace disease particularly the right lung base most consistent with pneumonia. 2. Stable cardiomegaly with Port-A-Cath unchanged in position. Electronically Signed   By: Ivar Drape M.D.   On: 05/12/2017 12:12    Microbiology: Recent Results (from the past 240 hour(s))  MRSA PCR Screening     Status: Abnormal   Collection Time: 05/09/17  1:41 AM  Result Value Ref Range Status   MRSA by PCR POSITIVE (A) NEGATIVE Final    Comment:        The GeneXpert  MRSA Assay (FDA approved for NASAL specimens only), is one component of a comprehensive MRSA colonization surveillance program. It is not intended to diagnose MRSA infection nor to guide or monitor treatment for MRSA infections. RESULT CALLED TO, READ BACK BY AND VERIFIED WITH: H PENG,RN @0445  05/10/17 MKELLY Performed at [facility], [place] [address], [place], [place] [address]   Blood Culture (routine x 2)     Status: None (Preliminary result)   Collection Time: 05/09/17  7:35 PM  Result Value Ref Range Status   Specimen Description   Final    BLOOD RIGHT ANTECUBITAL Performed at [facility] [address], [place], [place] [address]    Special Requests   Final    BOTTLES DRAWN AEROBIC AND ANAEROBIC Blood Culture results may not be optimal due to an excessive volume of blood received in culture bottles Performed at [facility] [address], [place], [place] [address]    Culture   Final    NO GROWTH 3 DAYS Performed at [facility], [place] [address], [place], [place] [address]    Report Status PENDING  Incomplete  Blood Culture (routine x 2)     Status: None (Preliminary result)   Collection Time: 05/09/17  7:44 PM  Result Value Ref Range Status   Specimen Description   Final    BLOOD BLOOD LEFT HAND Performed at [facility] [address] [place], [place] [address]    Special Requests   Final    BOTTLES  DRAWN AEROBIC AND ANAEROBIC Blood Culture adequate volume Performed at [facility] [address], [place], [place] [address]    Culture   Final    NO GROWTH 3 DAYS Performed at [facility], [place] [address] [place], [place] [address]    Report Status PENDING  Incomplete  Culture, sputum-assessment     Status: None   Collection Time: 05/10/17  6:55 AM  Result Value Ref Range Status   Specimen Description EXPECTORATED SPUTUM  Final   Special Requests Normal  Final   Sputum evaluation   Final    THIS SPECIMEN IS ACCEPTABLE FOR SPUTUM CULTURE Performed at [facility], [place] [address], [place], [place] [address]    Report Status 05/10/2017 FINAL  Final  Culture, respiratory (NON-Expectorated)     Status: None   Collection Time: 05/10/17  6:55 AM  Result Value Ref Range Status   Specimen Description   Final    EXPECTORATED SPUTUM Performed at [facility], [place] [address], [place], [place] [address]    Special Requests   Final    Normal Reflexed from [id number] Performed at [facility], [place] [address], [place], [place] [address]    Gram Stain   Final    RARE WBC PRESENT, PREDOMINANTLY PMN MODERATE GRAM POSITIVE COCCI    Culture   Final    ABUNDANT Consistent with normal respiratory flora. Performed at [facility], [place] [address] [place],  [address]    Report Status 05/12/2017 FINAL  Final  Respiratory Panel by PCR     Status: None   Collection Time: 05/10/17  4:00 PM  Result Value Ref Range Status   Adenovirus NOT DETECTED NOT DETECTED Final   Coronavirus 229E NOT DETECTED NOT DETECTED Final   Coronavirus HKU1 NOT DETECTED NOT DETECTED Final   Coronavirus NL63 NOT DETECTED NOT DETECTED Final   Coronavirus OC43 NOT  DETECTED NOT DETECTED Final   Metapneumovirus NOT DETECTED NOT DETECTED Final   Rhinovirus / Enterovirus NOT DETECTED NOT DETECTED Final   Influenza A NOT DETECTED NOT  DETECTED Final   Influenza B NOT DETECTED NOT DETECTED Final   Parainfluenza Virus 1 NOT DETECTED NOT DETECTED Final   Parainfluenza Virus 2 NOT DETECTED NOT DETECTED Final   Parainfluenza Virus 3 NOT DETECTED NOT DETECTED Final   Parainfluenza Virus 4 NOT DETECTED NOT DETECTED Final   Respiratory Syncytial Virus NOT DETECTED NOT DETECTED Final   Bordetella pertussis NOT DETECTED NOT DETECTED Final   Chlamydophila pneumoniae NOT DETECTED NOT DETECTED Final   Mycoplasma pneumoniae NOT DETECTED NOT DETECTED Final    Comment: Performed at [facility], [place] [address], [place], [place] [address]     Labs: Basic Metabolic Panel: Recent Labs  Lab 05/09/17 1944 05/10/17 0740 05/11/17 0705 05/12/17 0621 05/13/17 0509  NA 136 137 137 136 140  K 4.7 4.8 4.8 4.5 4.7  CL 100* 105 106 105 110  CO2 25 20* 20* 21* 22  GLUCOSE 98 106* 117* 146* 131*  BUN 50* 46* 44* 41* 42*  CREATININE 3.61* 3.08* 2.87* 2.70* 2.70*  CALCIUM 9.8 9.2 9.5 9.6 9.5  MG  --  2.2  --   --   --   PHOS  --  3.1  --   --   --    Liver Function Tests: Recent Labs  Lab 05/09/17 1944 05/10/17 0740  AST 39 40  ALT 33 33  ALKPHOS 90 80  BILITOT 0.7 0.9  PROT 7.3 6.5  ALBUMIN 3.0* 2.6*   No results for input(s): LIPASE, AMYLASE in the last 168 hours. No results for input(s): AMMONIA in the last 168 hours. CBC: Recent Labs  Lab 05/09/17 1944 05/10/17 0740 05/11/17 0705 05/12/17 0621 05/13/17 0509  WBC 16.6* 17.0* 14.4* 15.8* 11.3*  NEUTROABS 13.6*  --  11.9* 13.1* 9.0*  HGB 9.8* 8.9* 8.6* 9.1* 8.6*  HCT 29.9* 27.2* 26.6* 27.8* 26.8*  MCV 91.7 92.8 93.0 92.7 93.1  PLT 264 261 260 343 321   Cardiac Enzymes: No results for input(s): CKTOTAL, CKMB, CKMBINDEX, TROPONINI in the last 168 hours. BNP: BNP (last 3 results) No results for input(s): BNP in the last 8760 hours.  ProBNP (last 3 results) No results for input(s): PROBNP in the last 8760 hours.  CBG: Recent Labs  Lab 05/12/17 1115  05/12/17 1652 05/12/17 2134 05/13/17 0750 05/13/17 1141  GLUCAP 186* 83 218* 149* 201*       Signed:  Alma Friendly, MD [facility] 05/13/2017, 1:20 PM

## 2017-05-14 LAB — CULTURE, BLOOD (ROUTINE X 2)
Culture: NO GROWTH
Culture: NO GROWTH
Special Requests: ADEQUATE

## 2017-06-13 ENCOUNTER — Ambulatory Visit
Admission: RE | Admit: 2017-06-13 | Discharge: 2017-06-13 | Disposition: A | Discharge status: Home / Self Care | Payer: Medicare Other | Source: Ambulatory Visit | Attending: Physician Assistant | Admitting: Physician Assistant

## 2017-06-13 ENCOUNTER — Other Ambulatory Visit: Payer: Self-pay | Admitting: Physician Assistant

## 2017-06-13 ENCOUNTER — Other Ambulatory Visit: Payer: Self-pay

## 2017-06-13 DIAGNOSIS — J189 Pneumonia, unspecified organism: Secondary | ICD-10-CM

## 2017-06-13 DIAGNOSIS — Z01812 Encounter for preprocedural laboratory examination: Secondary | ICD-10-CM

## 2017-06-13 DIAGNOSIS — J181 Lobar pneumonia, unspecified organism: Principal | ICD-10-CM

## 2017-06-13 DIAGNOSIS — N189 Chronic kidney disease, unspecified: Secondary | ICD-10-CM

## 2017-07-18 ENCOUNTER — Encounter: Payer: Self-pay | Admitting: *Deleted

## 2017-07-18 ENCOUNTER — Encounter: Payer: Self-pay | Admitting: Surgery

## 2017-07-18 ENCOUNTER — Ambulatory Visit (HOSPITAL_COMMUNITY)
Admission: RE | Admit: 2017-07-18 | Discharge: 2017-07-18 | Disposition: A | Discharge status: Home / Self Care | Payer: Medicare Other | Source: Ambulatory Visit | Attending: Surgery | Admitting: Surgery

## 2017-07-18 ENCOUNTER — Other Ambulatory Visit: Payer: Self-pay

## 2017-07-18 ENCOUNTER — Ambulatory Visit (INDEPENDENT_AMBULATORY_CARE_PROVIDER_SITE_OTHER)
Admission: RE | Admit: 2017-07-18 | Discharge: 2017-07-18 | Disposition: A | Discharge status: Home / Self Care | Payer: Medicare Other | Source: Ambulatory Visit | Attending: Surgery | Admitting: Surgery

## 2017-07-18 ENCOUNTER — Other Ambulatory Visit: Payer: Self-pay | Admitting: *Deleted

## 2017-07-18 ENCOUNTER — Ambulatory Visit (INDEPENDENT_AMBULATORY_CARE_PROVIDER_SITE_OTHER): Payer: Medicare Other | Admitting: Surgery

## 2017-07-18 VITALS — BP 155/80 | HR 68 | Resp 18 | Ht 60.0 in | Wt 149.0 lb

## 2017-07-18 DIAGNOSIS — N189 Chronic kidney disease, unspecified: Secondary | ICD-10-CM | POA: Diagnosis not present

## 2017-07-18 DIAGNOSIS — Z01812 Encounter for preprocedural laboratory examination: Secondary | ICD-10-CM | POA: Diagnosis not present

## 2017-07-18 DIAGNOSIS — N184 Chronic kidney disease, stage 4 (severe): Secondary | ICD-10-CM | POA: Diagnosis not present

## 2017-07-18 NOTE — Progress Notes (Signed)
Vascular and Vein Specialist of [facility]  Patient name: Dana Jackson MRN: [id number] DOB: April 28, 1945 Sex: female   REQUESTING PROVIDER:    Dr. Joelyn Oms   REASON FOR CONSULT:    Dialysis access  HISTORY OF PRESENT ILLNESS:   Dana Jackson is a 72 y.o. female, who is referred today for evaluation of dialysis access.  The patient is right-handed.  Her renal failure is secondary to hypertension and peripheral vascular disease.  She does have a history of a partial left nephrectomy in 2010.  She is undergone CABG in the past.  She takes a statin for hyperlipidemia.  Her most recent GFR was less than 15.  PAST MEDICAL HISTORY    Past Medical History:  Diagnosis Date  . Anemia   . Arthritis   . Asthma    childhood  . Bacteremia due to Escherichia coli June 2015    Currently being treated with anti-biotics  . CAD in native artery 12/2010   a) Cath for exertional angina & EKG changes: 40% LM, 80% mid LAD.  95% ost cX, 80-90% PDA --> CABG X3; b) Post CABG CATH for + Myoview with basal anterior ischemia -> Ost LAD 80% (diffuse) then 100% after SP2, RI 70% (too small for PCI), Ost-prox Cx 99% & OM1 90%, OstrPDA 80% (small); Occluded SVG-rPDA.  Patent LIMA-dLAD, SVG-OM: culprit ~ p-m LAD pre-LIMA & RI - not good PCI target --> Med Rx  . CKD (chronic kidney disease) stage 3, GFR 30-59 ml/min (HCC)   . Degenerative disc disease, cervical    Degenerative disc disease, cervical [722.4]  . Diabetes mellitus without complication ([place])   . Endometrial cancer [facility]) November 2015   Treated with TAH with pelvic lymphadenectomy followed by radiation and chemotherapy  . GERD (gastroesophageal reflux disease)   . History of asthma     childhood  . History of pneumonia    "2-3 times"  . History of unstable angina November/December 2012   T wave inversions in inferolateral leads.  No stress test performed.  . Hyperlipidemia LDL goal <70   . Hypertension,  essential, benign   . Pneumonia    walking  . Renal cell carcinoma ([place]) 11/18/2008   T2aNx s/p partial left nephrectomy  . 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 Stroke [facility])    TIA  . TIA (transient ischemic attack) 1992 &  2010     FAMILY HISTORY   Family History  Problem Relation Age of Onset  . AAA (abdominal aortic aneurysm) Mother   . Coronary artery disease Father   . Heart disease Father   . Coronary artery disease Sister   . Coronary artery disease Brother   . Heart disease Brother   . Stroke Maternal Grandmother   . Heart attack Neg Hx     SOCIAL HISTORY:   Social History   Socioeconomic History  . Marital status: Married    Spouse name: [name]  . Number of children: 2  . Years of education: many  . Highest education level: Not on file  Occupational History  . 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 Financial resource strain: Not on file  . Food insecurity:    Worry: Not on file    Inability: Not on file  . Transportation needs:    Medical: Not on file  Non-medical: Not on file  Tobacco Use  . Smoking status: Never Smoker  . Smokeless tobacco: Never Used  Substance and Sexual Activity  . Alcohol use: Yes    Alcohol/week: 0.0 oz    Comment: "mixed drink once a year"  . Drug use: No  . Sexual activity: Yes    Partners: Male    Comment: husband  Lifestyle  . Physical activity:    Days per week: Not on file    Minutes per session: Not on file  . Stress: Not on file  Relationships  . Social connections:    Talks on phone: Not on file    Gets together: Not on file    Attends religious service: Not on file    Active member of club or organization: Not on file    Attends meetings of clubs or organizations: Not on file    Relationship status: Not on file  . Intimate partner violence:    Fear of current or ex partner: Not  on file    Emotionally abused: Not on file    Physically abused: Not on file    Forced sexual activity: Not on file  Other Topics Concern  . Not on file  Social History Narrative   She is a married Mother of 2.  -- Currently being very busy taking care of 2 foster children that are staying with her daughter.  A 45-year-old and a 31-year-old that they're trying to adopt.  She is very excited about the possibility of becoming a Grandmother.   She does walk and getting exercise, but she is wanting to get back into more activities just because she has really been limited due to her arthritic pains. She used to do things like walking and biking, and she may try to do some biking again, or at least stationary biking.    Does not smoke, does not drink.    ALLERGIES:    Allergies  Allergen Reactions  . Codeine Nausea Only  . Erythromycin Itching  . Lopid [Gemfibrozil] Itching  . Pravastatin Other (See Comments)    Used 01/2011 to 03/2011  . Simvastatin Other (See Comments)    Used 07/2007 to 11/2010 chg to pravastatin  . Sulfa Antibiotics     aki     CURRENT MEDICATIONS:    Current Outpatient Medications  Medication Sig Dispense Refill  . Alpha-Lipoic Acid 600 MG CAPS Take 600 mg by mouth daily.    [name] aspirin 81 MG tablet Take 81 mg by mouth every evening.     [name] atorvastatin (LIPITOR) 40 MG tablet Take 40 mg by mouth at bedtime.     . benzonatate (TESSALON) 100 MG capsule TAKE 1 CAPSULE (100 MG TOTAL) BY MOUTH 3 (THREE) TIMES DAILY AS NEEDED FOR UP TO 7 DAYS FOR COUGH.  0  . calcitRIOL (ROCALTROL) 0.25 MCG capsule Take 0.25 mcg by mouth daily.    [name] docusate sodium (COLACE) 100 MG capsule Take 100 mg by mouth daily as needed for mild constipation.    . furosemide (LASIX) 20 MG tablet Take 20 mg by mouth every evening.   1  . gabapentin (NEURONTIN) 100 MG capsule Take 100 mg by mouth 3 (three) times daily.    [name] glipiZIDE (GLUCOTROL) 5 MG tablet Take 2.5 mg by mouth 2 (two) times daily  before a meal.     . HYDROcodone-homatropine (HYCODAN) 5-1.5 MG/5ML syrup Take 5 mLs by mouth every 6 (six) hours as needed for cough.     [name]  isosorbide mononitrate (IMDUR) 30 MG 24 hr tablet TAKE 1 TABLET BY MOUTH EVERY DAY 90 tablet 2  . loperamide (IMODIUM) 2 MG capsule Take 2 mg by mouth 4 (four) times daily as needed for diarrhea or loose stools.    [name] loratadine (CLARITIN) 10 MG tablet Take 10 mg by mouth daily.    . magnesium oxide (MAG-OX) 400 MG tablet Take 400 mg by mouth daily.    . metoprolol tartrate (LOPRESSOR) 50 MG tablet TAKE 1 TABLET TWICE DAILY 180 tablet 1  . nitroGLYCERIN (NITROSTAT) 0.4 MG SL tablet PLACE 1 TABLET (0.4 MG TOTAL) UNDER THE TONGUE EVERY 5 (FIVE) MINUTES AS NEEDED FOR CHEST PAIN. 25 tablet 3  . omeprazole (PRILOSEC) 40 MG capsule Take 40 mg by mouth daily.      . ondansetron (ZOFRAN) 4 MG tablet Take 4 mg by mouth every 8 (eight) hours as needed for nausea or vomiting.     [name] PROAIR HFA 108 (90 Base) MCG/ACT inhaler Inhale 2 puffs into the lungs every 6 (six) hours as needed for wheezing or shortness of breath.     . Probiotic Product (ALIGN PO) Take 1 capsule by mouth daily.     . prochlorperazine (COMPAZINE) 10 MG tablet Take 10 mg by mouth every 6 (six) hours as needed for nausea or vomiting.    . promethazine (PHENERGAN) 25 MG tablet Take 25 mg by mouth every 6 (six) hours as needed for nausea or vomiting.     . Pyridoxine HCl (VITAMIN B-6 PO) Take 1 tablet by mouth daily.     . ranitidine (ZANTAC) 300 MG tablet Take 1 tablet by mouth at bedtime.     No current facility-administered medications for this visit.     REVIEW OF SYSTEMS:   [X]  denotes positive finding, [ ]  denotes negative finding Cardiac  Comments:  Chest pain or chest pressure:    Shortness of breath upon exertion:    Short of breath when lying flat:    Irregular heart rhythm:        Vascular    Pain in calf, thigh, or hip brought on by ambulation:    Pain in feet at night that wakes  you up from your sleep:     Blood clot in your veins:    Leg swelling:         Pulmonary    Oxygen at home:    Productive cough:     Wheezing:         Neurologic    Sudden weakness in arms or legs:     Sudden numbness in arms or legs:     Sudden onset of difficulty speaking or slurred speech:    Temporary loss of vision in one eye:     Problems with dizziness:         Gastrointestinal    Blood in stool:      Vomited blood:         Genitourinary    Burning when urinating:     Blood in urine:        Psychiatric    Major depression:         Hematologic    Bleeding problems:    Problems with blood clotting too easily:        Skin    Rashes or ulcers:        Constitutional    Fever or chills:     PHYSICAL EXAM:   Vitals:   07/18/17 0846  07/18/17 0847  BP: (!) 156/88 (!) 155/80  Pulse: 68   Resp: 18   SpO2: 96%   Weight: 149 lb (67.6 kg)   Height: 5' (1.524 m)     GENERAL: The patient is a well-nourished female, in no acute distress. The vital signs are documented above. CARDIAC: There is a regular rate and rhythm.  VASCULAR: Palpable left brachial pulse PULMONARY: Nonlabored respirations MUSCULOSKELETAL: There are no major deformities or cyanosis. NEUROLOGIC: No focal weakness or paresthesias are detected. SKIN: There are no ulcers or rashes noted. PSYCHIATRIC: The patient has a normal affect.  STUDIES:   I have ordered and reviewed her vascular lab studies with the following findings: Vein mapping: Left cephalic vein is greater than 3 mm throughout the arm. Arterial: Brachial artery bifurcates at the antecubital crease.  ASSESSMENT and PLAN   Chronic renal insufficiency, stage IV: We discussed proceeding with a left radiocephalic versus brachiocephalic fistula.  I will make this decision in the operating room.  We discussed the risks and benefits of the procedure including the risk of not maturity and the need for future interventions, as well as a risk  for steal syndrome.  All of her questions were answered.  Her procedure is been scheduled for Wednesday, July 24.  I am leaning towards a brachiocephalic fistula.   Annamarie Major, MD Vascular and Vein Specialists of [facility] [phone] Pager [phone]

## 2017-07-18 NOTE — H&P (View-Only) (Signed)
Vascular and Vein Specialist of [facility]  Patient name: Dana Jackson MRN: [id number] DOB: March 25, 1945 Sex: female   REQUESTING PROVIDER:    Dr. Joelyn Oms   REASON FOR CONSULT:    Dialysis access  HISTORY OF PRESENT ILLNESS:   Dana Jackson is a 72 y.o. female, who is referred today for evaluation of dialysis access.  The patient is right-handed.  Her renal failure is secondary to hypertension and peripheral vascular disease.  She does have a history of a partial left nephrectomy in 2010.  She is undergone CABG in the past.  She takes a statin for hyperlipidemia.  Her most recent GFR was less than 15.  PAST MEDICAL HISTORY    Past Medical History:  Diagnosis Date  . Anemia   . Arthritis   . Asthma    childhood  . Bacteremia due to Escherichia coli June 2015    Currently being treated with anti-biotics  . CAD in native artery 12/2010   a) Cath for exertional angina & EKG changes: 40% LM, 80% mid LAD.  95% ost cX, 80-90% PDA --> CABG X3; b) Post CABG CATH for + Myoview with basal anterior ischemia -> Ost LAD 80% (diffuse) then 100% after SP2, RI 70% (too small for PCI), Ost-prox Cx 99% & OM1 90%, OstrPDA 80% (small); Occluded SVG-rPDA.  Patent LIMA-dLAD, SVG-OM: culprit ~ p-m LAD pre-LIMA & RI - not good PCI target --> Med Rx  . CKD (chronic kidney disease) stage 3, GFR 30-59 ml/min (HCC)   . Degenerative disc disease, cervical    Degenerative disc disease, cervical [722.4]  . Diabetes mellitus without complication ([place])   . Endometrial cancer [facility]) November 2015   Treated with TAH with pelvic lymphadenectomy followed by radiation and chemotherapy  . GERD (gastroesophageal reflux disease)   . History of asthma     childhood  . History of pneumonia    "2-3 times"  . History of unstable angina November/December 2012   T wave inversions in inferolateral leads.  No stress test performed.  . Hyperlipidemia LDL goal <70   . Hypertension,  essential, benign   . Pneumonia    walking  . Renal cell carcinoma ([place]) 11/18/2008   T2aNx s/p partial left nephrectomy  . 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 Stroke [facility])    TIA  . TIA (transient ischemic attack) 1992 &  2010     FAMILY HISTORY   Family History  Problem Relation Age of Onset  . AAA (abdominal aortic aneurysm) Mother   . Coronary artery disease Father   . Heart disease Father   . Coronary artery disease Sister   . Coronary artery disease Brother   . Heart disease Brother   . Stroke Maternal Grandmother   . Heart attack Neg Hx     SOCIAL HISTORY:   Social History   Socioeconomic History  . Marital status: Married    Spouse name: [name]  . Number of children: 2  . Years of education: many  . Highest education level: Not on file  Occupational History  . 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 Financial resource strain: Not on file  . Food insecurity:    Worry: Not on file    Inability: Not on file  . Transportation needs:    Medical: Not on file  Non-medical: Not on file  Tobacco Use  . Smoking status: Never Smoker  . Smokeless tobacco: Never Used  Substance and Sexual Activity  . Alcohol use: Yes    Alcohol/week: 0.0 oz    Comment: "mixed drink once a year"  . Drug use: No  . Sexual activity: Yes    Partners: Male    Comment: husband  Lifestyle  . Physical activity:    Days per week: Not on file    Minutes per session: Not on file  . Stress: Not on file  Relationships  . Social connections:    Talks on phone: Not on file    Gets together: Not on file    Attends religious service: Not on file    Active member of club or organization: Not on file    Attends meetings of clubs or organizations: Not on file    Relationship status: Not on file  . Intimate partner violence:    Fear of current or ex partner: Not  on file    Emotionally abused: Not on file    Physically abused: Not on file    Forced sexual activity: Not on file  Other Topics Concern  . Not on file  Social History Narrative   She is a married Mother of 2.  -- Currently being very busy taking care of 2 foster children that are staying with her daughter.  A 5-year-old and a 52-year-old that they're trying to adopt.  She is very excited about the possibility of becoming a Grandmother.   She does walk and getting exercise, but she is wanting to get back into more activities just because she has really been limited due to her arthritic pains. She used to do things like walking and biking, and she may try to do some biking again, or at least stationary biking.    Does not smoke, does not drink.    ALLERGIES:    Allergies  Allergen Reactions  . Codeine Nausea Only  . Erythromycin Itching  . Lopid [Gemfibrozil] Itching  . Pravastatin Other (See Comments)    Used 01/2011 to 03/2011  . Simvastatin Other (See Comments)    Used 07/2007 to 11/2010 chg to pravastatin  . Sulfa Antibiotics     aki     CURRENT MEDICATIONS:    Current Outpatient Medications  Medication Sig Dispense Refill  . Alpha-Lipoic Acid 600 MG CAPS Take 600 mg by mouth daily.    [name] aspirin 81 MG tablet Take 81 mg by mouth every evening.     [name] atorvastatin (LIPITOR) 40 MG tablet Take 40 mg by mouth at bedtime.     . benzonatate (TESSALON) 100 MG capsule TAKE 1 CAPSULE (100 MG TOTAL) BY MOUTH 3 (THREE) TIMES DAILY AS NEEDED FOR UP TO 7 DAYS FOR COUGH.  0  . calcitRIOL (ROCALTROL) 0.25 MCG capsule Take 0.25 mcg by mouth daily.    [name] docusate sodium (COLACE) 100 MG capsule Take 100 mg by mouth daily as needed for mild constipation.    . furosemide (LASIX) 20 MG tablet Take 20 mg by mouth every evening.   1  . gabapentin (NEURONTIN) 100 MG capsule Take 100 mg by mouth 3 (three) times daily.    [name] glipiZIDE (GLUCOTROL) 5 MG tablet Take 2.5 mg by mouth 2 (two) times daily  before a meal.     . HYDROcodone-homatropine (HYCODAN) 5-1.5 MG/5ML syrup Take 5 mLs by mouth every 6 (six) hours as needed for cough.     [name]  isosorbide mononitrate (IMDUR) 30 MG 24 hr tablet TAKE 1 TABLET BY MOUTH EVERY DAY 90 tablet 2  . loperamide (IMODIUM) 2 MG capsule Take 2 mg by mouth 4 (four) times daily as needed for diarrhea or loose stools.    [name] loratadine (CLARITIN) 10 MG tablet Take 10 mg by mouth daily.    . magnesium oxide (MAG-OX) 400 MG tablet Take 400 mg by mouth daily.    . metoprolol tartrate (LOPRESSOR) 50 MG tablet TAKE 1 TABLET TWICE DAILY 180 tablet 1  . nitroGLYCERIN (NITROSTAT) 0.4 MG SL tablet PLACE 1 TABLET (0.4 MG TOTAL) UNDER THE TONGUE EVERY 5 (FIVE) MINUTES AS NEEDED FOR CHEST PAIN. 25 tablet 3  . omeprazole (PRILOSEC) 40 MG capsule Take 40 mg by mouth daily.      . ondansetron (ZOFRAN) 4 MG tablet Take 4 mg by mouth every 8 (eight) hours as needed for nausea or vomiting.     [name] PROAIR HFA 108 (90 Base) MCG/ACT inhaler Inhale 2 puffs into the lungs every 6 (six) hours as needed for wheezing or shortness of breath.     . Probiotic Product (ALIGN PO) Take 1 capsule by mouth daily.     . prochlorperazine (COMPAZINE) 10 MG tablet Take 10 mg by mouth every 6 (six) hours as needed for nausea or vomiting.    . promethazine (PHENERGAN) 25 MG tablet Take 25 mg by mouth every 6 (six) hours as needed for nausea or vomiting.     . Pyridoxine HCl (VITAMIN B-6 PO) Take 1 tablet by mouth daily.     . ranitidine (ZANTAC) 300 MG tablet Take 1 tablet by mouth at bedtime.     No current facility-administered medications for this visit.     REVIEW OF SYSTEMS:   [X]  denotes positive finding, [ ]  denotes negative finding Cardiac  Comments:  Chest pain or chest pressure:    Shortness of breath upon exertion:    Short of breath when lying flat:    Irregular heart rhythm:        Vascular    Pain in calf, thigh, or hip brought on by ambulation:    Pain in feet at night that wakes  you up from your sleep:     Blood clot in your veins:    Leg swelling:         Pulmonary    Oxygen at home:    Productive cough:     Wheezing:         Neurologic    Sudden weakness in arms or legs:     Sudden numbness in arms or legs:     Sudden onset of difficulty speaking or slurred speech:    Temporary loss of vision in one eye:     Problems with dizziness:         Gastrointestinal    Blood in stool:      Vomited blood:         Genitourinary    Burning when urinating:     Blood in urine:        Psychiatric    Major depression:         Hematologic    Bleeding problems:    Problems with blood clotting too easily:        Skin    Rashes or ulcers:        Constitutional    Fever or chills:     PHYSICAL EXAM:   Vitals:   07/18/17 0846  07/18/17 0847  BP: (!) 156/88 (!) 155/80  Pulse: 68   Resp: 18   SpO2: 96%   Weight: 149 lb (67.6 kg)   Height: 5' (1.524 m)     GENERAL: The patient is a well-nourished female, in no acute distress. The vital signs are documented above. CARDIAC: There is a regular rate and rhythm.  VASCULAR: Palpable left brachial pulse PULMONARY: Nonlabored respirations MUSCULOSKELETAL: There are no major deformities or cyanosis. NEUROLOGIC: No focal weakness or paresthesias are detected. SKIN: There are no ulcers or rashes noted. PSYCHIATRIC: The patient has a normal affect.  STUDIES:   I have ordered and reviewed her vascular lab studies with the following findings: Vein mapping: Left cephalic vein is greater than 3 mm throughout the arm. Arterial: Brachial artery bifurcates at the antecubital crease.  ASSESSMENT and PLAN   Chronic renal insufficiency, stage IV: We discussed proceeding with a left radiocephalic versus brachiocephalic fistula.  I will make this decision in the operating room.  We discussed the risks and benefits of the procedure including the risk of not maturity and the need for future interventions, as well as a risk  for steal syndrome.  All of her questions were answered.  Her procedure is been scheduled for Wednesday, July 24.  I am leaning towards a brachiocephalic fistula.   Annamarie Major, MD Vascular and Vein Specialists of [facility] [phone] Pager [phone]

## 2017-07-27 ENCOUNTER — Encounter: Payer: Self-pay | Admitting: Cardiology

## 2017-07-27 ENCOUNTER — Ambulatory Visit: Payer: Medicare Other | Admitting: Cardiology

## 2017-07-27 VITALS — BP 136/74 | HR 72 | Ht 60.0 in | Wt 151.0 lb

## 2017-07-27 DIAGNOSIS — I10 Essential (primary) hypertension: Secondary | ICD-10-CM | POA: Diagnosis not present

## 2017-07-27 DIAGNOSIS — E785 Hyperlipidemia, unspecified: Secondary | ICD-10-CM

## 2017-07-27 DIAGNOSIS — I25119 Atherosclerotic heart disease of native coronary artery with unspecified angina pectoris: Secondary | ICD-10-CM

## 2017-07-27 DIAGNOSIS — Z79899 Other long term (current) drug therapy: Secondary | ICD-10-CM | POA: Diagnosis not present

## 2017-07-27 MED ORDER — NITROGLYCERIN 0.4 MG SL SUBL: SUBLINGUAL_TABLET | SUBLINGUAL | 3 refills | Status: DC

## 2017-07-27 NOTE — Patient Instructions (Signed)
Dr Ellyn Hack recommends that you continue on your current medications as directed. Please refer to the Current Medication list given to you today.  Your physician recommends that you return for lab work at your convenience - FASTING.  Dr Ellyn Hack recommends that you schedule a follow-up appointment in 12 months. You will receive a reminder letter in the mail two months in advance. If you don't receive a letter, please call our office to schedule the follow-up appointment.  If you need a refill on your cardiac medications before your next appointment, please call your pharmacy.

## 2017-07-27 NOTE — Progress Notes (Signed)
PCP: Heywood Bene, PA-C  Clinic Note: Chief Complaint  Patient presents with  . Follow-up    No major complaints  . Coronary Artery Disease    No angina    HPI: Dana Jackson is a 72 y.o. female with a PMH below who presents today for ~ 1 yr f/u for CAD-CABG She has been treated at [facility] for endometrial cancer status post TAH/BSO followed by chemotherapy and radiation - now on maintenance Rx. They have reduced the intervals for her follow-up checks now.  She has had a history of a TIA as well.  Dana Jackson was last seen on well.  In good mood.  No further episodes of chest pain or discomfort/dyspnea.  Was looking forward to completing her treatment courses for cancer and getting back and exercise.  Recent Hospitalizations:   May 09, 2016: Admitted with pneumonia.  Studies Personally Reviewed - (if available, images/films reviewed: From Epic Chart or Care Everywhere)  none  Interval History: Dana Jackson returns today totally stable cardiac standpoint.  She is gradually getting back to her exercise level without any active symptoms.  She is short of breath with exertion, but has recently recovered from a pneumonia and is probably not all the way back it.  She is not having any resting or exertional chest tightness or pressure symptoms.  No PND, orthopnea or edema. She denies any rapid irregular heartbeats palpitations.  She felt syncope or near syncope.  No TIA or amaurosis fugax. No claudication.  ROS: A comprehensive was performed. Review of Systems  Constitutional: Negative for malaise/fatigue and weight loss.  HENT: Negative for congestion and nosebleeds.   Respiratory: Positive for cough (Still a little residual cough, but almost gone.). Negative for sputum production and wheezing.   Gastrointestinal: Negative for abdominal pain, blood in stool, heartburn and melena.  Genitourinary: Negative for dysuria and hematuria.       Nocturia  Musculoskeletal:  Positive for myalgias (Nighttime leg cramps).  Neurological: Negative for dizziness (Only if she stands up fast) and focal weakness.  Psychiatric/Behavioral: Negative for depression and memory loss. The patient has insomnia.        She actually has a very good outlook on life.  All other systems reviewed and are negative.  I have reviewed and (if needed) personally updated the patient's problem list, medications, allergies, past medical and surgical history, social and family history.   Past Medical History:  Diagnosis Date  . Anemia   . Arthritis   . Asthma    childhood  . Bacteremia due to Escherichia coli June 2015    Currently being treated with anti-biotics  . CAD in native artery 12/2010   a) Cath for exertional angina & EKG changes: 40% LM, 80% mid LAD.  95% ost cX, 80-90% PDA --> CABG X3; b) Post CABG CATH for + Myoview with basal anterior ischemia -> Ost LAD 80% (diffuse) then 100% after SP2, RI 70% (too small for PCI), Ost-prox Cx 99% & OM1 90%, OstrPDA 80% (small); Occluded SVG-rPDA.  Patent LIMA-dLAD, SVG-OM: culprit ~ p-m LAD pre-LIMA & RI - not good PCI target --> Med Rx  . CKD (chronic kidney disease) stage 3, GFR 30-59 ml/min (HCC)   . Degenerative disc disease, cervical    Degenerative disc disease, cervical [722.4]  . Diabetes mellitus without complication ([place])   . Endometrial cancer [facility]) November 2015   Treated with TAH with pelvic lymphadenectomy followed by radiation and chemotherapy  . GERD (gastroesophageal reflux disease)   .  History of asthma     childhood  . History of pneumonia    "2-3 times"  . History of unstable angina November/December 2012   T wave inversions in inferolateral leads.  No stress test performed.  . Hyperlipidemia LDL goal <70   . Hypertension, essential, benign   . Pneumonia    walking  . Renal cell carcinoma ([place]) 11/18/2008   T2aNx s/p partial left nephrectomy  . 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  Stroke [facility])    TIA  . TIA (transient ischemic attack) 1992 &  2010    Past Surgical History:  Procedure Laterality Date  . ANTERIOR CERVICAL DECOMP/DISCECTOMY FUSION  10/11/2005   multi-level  . CARDIAC CATHETERIZATION  12/24/10   40% left main, 80% mid LAD, 95% ostial circumflex, 80-90% PDA.  [name] CARDIAC CATHETERIZATION N/A 07/01/2014   Procedure: Left Heart Cath and Cors/Grafts Angiography;  Surgeon: Leonie Man, MD;  Location: [facility] INVASIVE CV LAB:  For Abn Nuc @ [facility]: Ost LAD 80%, mid LAD 100% after S/P 2, 70% RI (no PCI target), ost-prox Cx 99%, OM1 90%. Ost rPDA 80% (~ pre-CABG), occluded SVG-rPDA, patent LIMA-dLAD, SVG OM; potential culprit for abn Nuc scan = pLAD Dz, small RI 70% or PDA -> med Rx  . CAROTID ENDARTERECTOMY Left   . CHOLECYSTECTOMY  ~ 1971  . COLONOSCOPY    . CORONARY ARTERY BYPASS GRAFT  12/25/2010   Procedure: CORONARY ARTERY BYPASS GRAFTING (CABGX3 - LIMA-LAD, SVG RPL, SVG-Circumflex);  Surgeon: Rexene Alberts, MD;  Location: [place];  Service: Open Heart Surgery;  Laterality: N/A;  . DILATATION & CURETTAGE/HYSTEROSCOPY WITH MYOSURE N/A 11/02/2013   Procedure: DILATATION & CURETTAGE/HYSTEROSCOPY WITH MYOSURE ABLATION;  Surgeon: Allena Katz, MD;  Location: [facility];  Service: Gynecology;  Laterality: N/A;  . DOPPLER ECHOCARDIOGRAPHY  12/08/2010   EF =>55%,MILD CONCENTRIC LEFT VENTRICULAR HYPERTROPHY  . LEFT HEART CATHETERIZATION WITH CORONARY ANGIOGRAM N/A 12/24/2010   Procedure: LEFT HEART CATHETERIZATION WITH CORONARY ANGIOGRAM;  Surgeon: Leonie Man, MD;  Location: [facility] CATH LAB;  Service: Cardiovascular;  Laterality: N/A;  . NM MYOCAR SINGLE W/SPECT  07/26/2007   EF 79%, LEFT VENT.FUNCTION NORMAL  . PARTIAL NEPHRECTOMY Left 11/18/2008   left partial nephrectomy for renal cell CA  . PET Myocardial Perfusion Scan  06/13/2014   At [place]: Moderate size, mild severity completely reversible defect involving the basal anterior, mid anterior and apical  anterior segments consistent with ischemia. EF 65% with normal global function.  . TONSILLECTOMY     "in college"  . TOTAL ABDOMINAL HYSTERECTOMY  November 2015    At [facility]: Robotic procedure with pelvic lymphadenectomy  . TRANSTHORACIC ECHOCARDIOGRAM  06/13/2014   At [place]: EF 60-65%. Grade 1 diastolic dysfunction. Mild MR. Aortic sclerosis. Moderate pulmonary hypertension  . TUBAL LIGATION  ~ 1984  . UPPER GI ENDOSCOPY    . WISDOM TOOTH EXTRACTION      Current Meds  Medication Sig  . Alpha-Lipoic Acid 600 MG CAPS Take 600 mg by mouth daily.  [name] aspirin 81 MG tablet Take 81 mg by mouth every evening.   [name] atorvastatin (LIPITOR) 40 MG tablet Take 40 mg by mouth at bedtime.   . calcitRIOL (ROCALTROL) 0.25 MCG capsule Take 0.25 mcg by mouth daily.  . furosemide (LASIX) 20 MG tablet Take 20 mg by mouth every evening.   . gabapentin (NEURONTIN) 100 MG capsule Take 100-200 mg by  mouth 2 (two) times daily. Take 100mg  by mouth in the morning and 200mg  at bedtime  . glipiZIDE (GLUCOTROL) 5 MG tablet Take 2.5 mg by mouth 2 (two) times daily before a meal.   . HYDROcodone-homatropine (HYCODAN) 5-1.5 MG/5ML syrup Take 5 mLs by mouth every 6 (six) hours as needed for cough.   . isosorbide mononitrate (IMDUR) 30 MG 24 hr tablet TAKE 1 TABLET BY MOUTH EVERY DAY  . loperamide (IMODIUM) 2 MG capsule Take 2 mg by mouth 4 (four) times daily as needed for diarrhea or loose stools.  [name] loratadine (CLARITIN) 10 MG tablet Take 10 mg by mouth daily.  . magnesium oxide (MAG-OX) 400 MG tablet Take 400 mg by mouth daily.  . metoprolol tartrate (LOPRESSOR) 50 MG tablet TAKE 1 TABLET TWICE DAILY (Patient taking differently: TAKE 25MG  BY MOUTH TWICE DAILY)  . nitroGLYCERIN (NITROSTAT) 0.4 MG SL tablet PLACE 1 TABLET (0.4 MG TOTAL) UNDER THE TONGUE EVERY 5 (FIVE) MINUTES AS NEEDED FOR CHEST PAIN.  [name] omeprazole (PRILOSEC) 40 MG capsule Take 40 mg by mouth daily.    [name] PROAIR HFA 108 (90 Base) MCG/ACT  inhaler Inhale 2 puffs into the lungs every 6 (six) hours as needed for wheezing or shortness of breath.   . Probiotic Product (ALIGN PO) Take 1 capsule by mouth daily.   . promethazine (PHENERGAN) 25 MG tablet Take 25 mg by mouth every 6 (six) hours as needed for nausea or vomiting.   . pyridOXINE (VITAMIN B-6) 100 MG tablet Take 600 mg by mouth daily.   . ranitidine (ZANTAC) 300 MG tablet Take 300 mg by mouth at bedtime.   . [DISCONTINUED] nitroGLYCERIN (NITROSTAT) 0.4 MG SL tablet PLACE 1 TABLET (0.4 MG TOTAL) UNDER THE TONGUE EVERY 5 (FIVE) MINUTES AS NEEDED FOR CHEST PAIN.    Allergies  Allergen Reactions  . Codeine Nausea Only  . Erythromycin Itching  . Lopid [Gemfibrozil] Itching  . Pravastatin Other (See Comments)    Used 01/2011 to 03/2011  . Simvastatin Other (See Comments)    Used 07/2007 to 11/2010 chg to pravastatin  . Sulfa Antibiotics     aki     Social History   Tobacco Use  . Smoking status: Never Smoker  . Smokeless tobacco: Never Used  Substance Use Topics  . Alcohol use: Yes    Alcohol/week: 0.0 oz    Comment: "mixed drink once a year"  . Drug use: No   Social History   Social History Narrative   She is a married Mother of 2.  -- Currently being very busy taking care of 2 foster children that are staying with her daughter.  A 78-year-old and a 35-year-old that they're trying to adopt.  She is very excited about the possibility of becoming a Grandmother.   She does walk and getting exercise, but she is wanting to get back into more activities just because she has really been limited due to her arthritic pains. She used to do things like walking and biking, and she may try to do some biking again, or at least stationary biking.    Does not smoke, does not drink.    family history includes AAA (abdominal aortic aneurysm) in her mother; Coronary artery disease in her brother, father, and sister; Heart disease in her brother and father; Stroke in her maternal  grandmother.  Wt Readings from Last 3 Encounters:  07/27/17 151 lb (68.5 kg)  07/18/17 149 lb (67.6 kg)  05/10/17 156 lb 8.4 oz (  71 kg)    PHYSICAL EXAM BP 136/74   Pulse 72   Ht 5' (1.524 m)   Wt 151 lb (68.5 kg)   BMI 29.49 kg/m  Physical Exam  Constitutional: She is oriented to person, place, and time. She appears well-developed and well-nourished. No distress.  Probably healthy-appearing.  Borderline obese.  Well-groomed.  Still wearing a wig because her hair has not fully grown back in.  Neck: No hepatojugular reflux and no JVD present. Carotid bruit is not present.  Cardiovascular: Normal rate, regular rhythm and S1 normal. PMI is not displaced. Exam reveals no gallop and no S4.  Murmur heard.  Harsh crescendo-decrescendo midsystolic murmur is present with a grade of 2/6 at the upper right sternal border radiating to the neck. Physiologically split S2  Pulmonary/Chest: Effort normal and breath sounds normal. No respiratory distress. She has no wheezes. She has no rales. She exhibits no tenderness.  Mild interstitial sounds, no rhonchi or crackles.  Abdominal: Soft. Bowel sounds are normal. She exhibits no distension. There is no tenderness. There is no rebound.  No HSM  Musculoskeletal: Normal range of motion. She exhibits no edema.  Neurological: She is alert and oriented to person, place, and time.  Psychiatric: She has a normal mood and affect. Her behavior is normal. Judgment and thought content normal.  Vitals reviewed.   Adult ECG Report N/a  Other studies Reviewed: Additional studies/ records that were reviewed today include:  Recent Labs:  None available   ASSESSMENT / PLAN: Problem List Items Addressed This Visit    Hyperlipidemia LDL goal <70 (Chronic)    She remains on atorvastatin and follow-up labs checked in some time.   Plan: Check lipid panel.  Continue atorvastatin for now.  Adjust according.      Relevant Medications   nitroGLYCERIN (NITROSTAT)  0.4 MG SL tablet   Other Relevant Orders   Lipid panel   Hepatic function panel   Essential hypertension (Chronic)    She has stable blood pressure on current meds.  No change      Relevant Medications   nitroGLYCERIN (NITROSTAT) 0.4 MG SL tablet   Coronary artery disease involving native coronary artery of native heart with angina pectoris ([place]) - Primary (Chronic)    He does not have any recurrent anginal symptoms.  Most recent heart catheterization showed occluded vein graft to the right being treated medically. No angina on current dose of Imdur and metoprolol. She does take a little bit of Lasix for mild edema and possible diastolic dysfunction.  We decided to check a Myoview if she would have symptoms.  This is an effort to minimize how many test she is having done.  Plan: Continue aspirin, statin, beta-blocker and Imdur.      Relevant Medications   nitroGLYCERIN (NITROSTAT) 0.4 MG SL tablet    Other Visit Diagnoses    Medication management       Relevant Orders   Lipid panel   Hepatic function panel      I spent a total of 25 minutes with the patient and chart review. >  50% of the time was spent in direct patient consultation.   Current medicines are reviewed at length with the patient today.  (+/- concerns) n/a The following changes have been made:  n/a  Patient Instructions  Dr Ellyn Hack recommends that you continue on your current medications as directed. Please refer to the Current Medication list given to you today.  Your physician recommends that you return  for lab work at [facility].  Dr Ellyn Hack recommends that you schedule a follow-up appointment in 12 months. You will receive a reminder letter in the mail two months in advance. If you don't receive a letter, please call our office to schedule the follow-up appointment.  If you need a refill on your cardiac medications before your next appointment, please call your pharmacy.    Studies  Ordered:   Orders Placed This Encounter  Procedures  . Lipid panel  . Hepatic function panel      Glenetta Hew, M.D., M.S. Interventional Cardiologist   Pager # [phone] Phone # [phone] [address]. [place], [place] [address]   Thank you for choosing [facility] at [facility]!!

## 2017-07-30 ENCOUNTER — Encounter: Payer: Self-pay | Admitting: Cardiology

## 2017-07-30 NOTE — Assessment & Plan Note (Signed)
She has stable blood pressure on current meds.  No change

## 2017-07-30 NOTE — Assessment & Plan Note (Signed)
She remains on atorvastatin and follow-up labs checked in some time.   Plan: Check lipid panel.  Continue atorvastatin for now.  Adjust according.

## 2017-07-30 NOTE — Assessment & Plan Note (Addendum)
He does not have any recurrent anginal symptoms.  Most recent heart catheterization showed occluded vein graft to the right being treated medically. No angina on current dose of Imdur and metoprolol. She does take a little bit of Lasix for mild edema and possible diastolic dysfunction.  We decided to check a Myoview if she would have symptoms.  This is an effort to minimize how many test she is having done.  Plan: Continue aspirin, statin, beta-blocker and Imdur.

## 2017-08-02 ENCOUNTER — Encounter (HOSPITAL_COMMUNITY): Payer: Self-pay | Admitting: *Deleted

## 2017-08-02 ENCOUNTER — Other Ambulatory Visit: Payer: Self-pay

## 2017-08-02 DIAGNOSIS — Z79899 Other long term (current) drug therapy: Secondary | ICD-10-CM | POA: Diagnosis not present

## 2017-08-02 DIAGNOSIS — E785 Hyperlipidemia, unspecified: Secondary | ICD-10-CM | POA: Diagnosis not present

## 2017-08-02 DIAGNOSIS — E1122 Type 2 diabetes mellitus with diabetic chronic kidney disease: Secondary | ICD-10-CM | POA: Diagnosis not present

## 2017-08-02 DIAGNOSIS — Z85528 Personal history of other malignant neoplasm of kidney: Secondary | ICD-10-CM | POA: Diagnosis not present

## 2017-08-02 DIAGNOSIS — Z9221 Personal history of antineoplastic chemotherapy: Secondary | ICD-10-CM | POA: Diagnosis not present

## 2017-08-02 DIAGNOSIS — Z951 Presence of aortocoronary bypass graft: Secondary | ICD-10-CM | POA: Diagnosis not present

## 2017-08-02 DIAGNOSIS — N184 Chronic kidney disease, stage 4 (severe): Secondary | ICD-10-CM | POA: Diagnosis not present

## 2017-08-02 DIAGNOSIS — Z8542 Personal history of malignant neoplasm of other parts of uterus: Secondary | ICD-10-CM | POA: Diagnosis not present

## 2017-08-02 DIAGNOSIS — K219 Gastro-esophageal reflux disease without esophagitis: Secondary | ICD-10-CM | POA: Diagnosis not present

## 2017-08-02 DIAGNOSIS — Z7984 Long term (current) use of oral hypoglycemic drugs: Secondary | ICD-10-CM | POA: Diagnosis not present

## 2017-08-02 DIAGNOSIS — E1151 Type 2 diabetes mellitus with diabetic peripheral angiopathy without gangrene: Secondary | ICD-10-CM | POA: Diagnosis not present

## 2017-08-02 DIAGNOSIS — Z7982 Long term (current) use of aspirin: Secondary | ICD-10-CM | POA: Diagnosis not present

## 2017-08-02 DIAGNOSIS — I129 Hypertensive chronic kidney disease with stage 1 through stage 4 chronic kidney disease, or unspecified chronic kidney disease: Secondary | ICD-10-CM | POA: Diagnosis not present

## 2017-08-02 DIAGNOSIS — Z905 Acquired absence of kidney: Secondary | ICD-10-CM | POA: Diagnosis not present

## 2017-08-02 DIAGNOSIS — Z8673 Personal history of transient ischemic attack (TIA), and cerebral infarction without residual deficits: Secondary | ICD-10-CM | POA: Diagnosis not present

## 2017-08-02 DIAGNOSIS — I2510 Atherosclerotic heart disease of native coronary artery without angina pectoris: Secondary | ICD-10-CM | POA: Diagnosis not present

## 2017-08-02 DIAGNOSIS — Z923 Personal history of irradiation: Secondary | ICD-10-CM | POA: Diagnosis not present

## 2017-08-02 NOTE — Progress Notes (Signed)
I instructed patient to not take Glipizide tonight or in am. I instructed patient to check CBG after awaking and every 2 hours until arrival  to the hospital.  I Instructed patient if CBG is less than 70 to drink  1/2 cup of a clear juice. Recheck CBG in 15 minutes, note time that you drank the juice.

## 2017-08-03 ENCOUNTER — Ambulatory Visit (HOSPITAL_COMMUNITY): Payer: Medicare Other | Admitting: Certified Registered Nurse Anesthetist

## 2017-08-03 ENCOUNTER — Other Ambulatory Visit: Payer: Self-pay

## 2017-08-03 ENCOUNTER — Encounter (HOSPITAL_COMMUNITY)
Admission: RE | Disposition: A | Discharge status: Home / Self Care | Payer: Self-pay | Source: Ambulatory Visit | Attending: Vascular Surgery

## 2017-08-03 ENCOUNTER — Ambulatory Visit (HOSPITAL_COMMUNITY)
Admission: RE | Admit: 2017-08-03 | Discharge: 2017-08-03 | Disposition: A | Discharge status: Home / Self Care | Payer: Medicare Other | Source: Ambulatory Visit | Attending: Vascular Surgery | Admitting: Vascular Surgery

## 2017-08-03 ENCOUNTER — Encounter (HOSPITAL_COMMUNITY): Payer: Self-pay | Admitting: Certified Registered Nurse Anesthetist

## 2017-08-03 DIAGNOSIS — Z923 Personal history of irradiation: Secondary | ICD-10-CM | POA: Insufficient documentation

## 2017-08-03 DIAGNOSIS — Z7984 Long term (current) use of oral hypoglycemic drugs: Secondary | ICD-10-CM | POA: Insufficient documentation

## 2017-08-03 DIAGNOSIS — E785 Hyperlipidemia, unspecified: Secondary | ICD-10-CM | POA: Insufficient documentation

## 2017-08-03 DIAGNOSIS — E1151 Type 2 diabetes mellitus with diabetic peripheral angiopathy without gangrene: Secondary | ICD-10-CM | POA: Insufficient documentation

## 2017-08-03 DIAGNOSIS — Z85528 Personal history of other malignant neoplasm of kidney: Secondary | ICD-10-CM | POA: Insufficient documentation

## 2017-08-03 DIAGNOSIS — K219 Gastro-esophageal reflux disease without esophagitis: Secondary | ICD-10-CM | POA: Insufficient documentation

## 2017-08-03 DIAGNOSIS — I2510 Atherosclerotic heart disease of native coronary artery without angina pectoris: Secondary | ICD-10-CM | POA: Diagnosis not present

## 2017-08-03 DIAGNOSIS — I129 Hypertensive chronic kidney disease with stage 1 through stage 4 chronic kidney disease, or unspecified chronic kidney disease: Secondary | ICD-10-CM | POA: Diagnosis not present

## 2017-08-03 DIAGNOSIS — Z7982 Long term (current) use of aspirin: Secondary | ICD-10-CM | POA: Insufficient documentation

## 2017-08-03 DIAGNOSIS — E1122 Type 2 diabetes mellitus with diabetic chronic kidney disease: Secondary | ICD-10-CM | POA: Diagnosis not present

## 2017-08-03 DIAGNOSIS — N184 Chronic kidney disease, stage 4 (severe): Secondary | ICD-10-CM | POA: Insufficient documentation

## 2017-08-03 DIAGNOSIS — Z8542 Personal history of malignant neoplasm of other parts of uterus: Secondary | ICD-10-CM | POA: Insufficient documentation

## 2017-08-03 DIAGNOSIS — Z951 Presence of aortocoronary bypass graft: Secondary | ICD-10-CM | POA: Insufficient documentation

## 2017-08-03 DIAGNOSIS — Z905 Acquired absence of kidney: Secondary | ICD-10-CM | POA: Insufficient documentation

## 2017-08-03 DIAGNOSIS — Z8673 Personal history of transient ischemic attack (TIA), and cerebral infarction without residual deficits: Secondary | ICD-10-CM | POA: Insufficient documentation

## 2017-08-03 DIAGNOSIS — Z79899 Other long term (current) drug therapy: Secondary | ICD-10-CM | POA: Insufficient documentation

## 2017-08-03 DIAGNOSIS — Z9221 Personal history of antineoplastic chemotherapy: Secondary | ICD-10-CM | POA: Insufficient documentation

## 2017-08-03 DIAGNOSIS — N185 Chronic kidney disease, stage 5: Secondary | ICD-10-CM

## 2017-08-03 HISTORY — DX: Unspecified hearing loss, unspecified ear: H91.90

## 2017-08-03 HISTORY — DX: Polyneuropathy, unspecified: G62.9

## 2017-08-03 HISTORY — DX: Personal history of other medical treatment: Z92.89

## 2017-08-03 HISTORY — DX: Cardiac murmur, unspecified: R01.1

## 2017-08-03 HISTORY — PX: AV FISTULA PLACEMENT: SHX1204

## 2017-08-03 LAB — POCT I-STAT 4, (NA,K, GLUC, HGB,HCT)
GLUCOSE: 122 mg/dL — AB (ref 70–99)
HCT: 29 % — ABNORMAL LOW (ref 36.0–46.0)
Hemoglobin: 9.9 g/dL — ABNORMAL LOW (ref 12.0–15.0)
POTASSIUM: 4.4 mmol/L (ref 3.5–5.1)
Sodium: 139 mmol/L (ref 135–145)

## 2017-08-03 LAB — SURGICAL PCR SCREEN
MRSA, PCR: NEGATIVE
Staphylococcus aureus: NEGATIVE

## 2017-08-03 LAB — HEMOGLOBIN A1C
HEMOGLOBIN A1C: 5.8 % — AB (ref 4.8–5.6)
Mean Plasma Glucose: 119.76 mg/dL

## 2017-08-03 LAB — GLUCOSE, CAPILLARY
GLUCOSE-CAPILLARY: 116 mg/dL — AB (ref 70–99)
Glucose-Capillary: 129 mg/dL — ABNORMAL HIGH (ref 70–99)

## 2017-08-03 SURGERY — ARTERIOVENOUS (AV) FISTULA CREATION
Anesthesia: Monitor Anesthesia Care | Laterality: Left

## 2017-08-03 MED ORDER — ONDANSETRON HCL 4 MG/2ML IJ SOLN: 4.0000 mg | Freq: Once | INTRAMUSCULAR | Status: DC | PRN

## 2017-08-03 MED ORDER — CHLORHEXIDINE GLUCONATE 4 % EX LIQD: 60.0000 mL | Freq: Once | CUTANEOUS | Status: DC

## 2017-08-03 MED ORDER — OXYCODONE-ACETAMINOPHEN 5-325 MG PO TABS: 1.0000 | ORAL_TABLET | Freq: Four times a day (QID) | ORAL | 0 refills | Status: DC | PRN

## 2017-08-03 MED ORDER — SODIUM CHLORIDE 0.9 % IV SOLN
INTRAVENOUS | Status: DC
  Administered 2017-08-03: 500 mL via INTRAVENOUS
  Administered 2017-08-03: 10:00:00 via INTRAVENOUS

## 2017-08-03 MED ORDER — CEFAZOLIN SODIUM-DEXTROSE 2-4 GM/100ML-% IV SOLN
2.0000 g | INTRAVENOUS | Status: AC
  Administered 2017-08-03: 2 g via INTRAVENOUS
  Filled 2017-08-03: qty 100

## 2017-08-03 MED ORDER — MEPERIDINE HCL 50 MG/ML IJ SOLN: 6.2500 mg | INTRAMUSCULAR | Status: DC | PRN

## 2017-08-03 MED ORDER — HYDROMORPHONE HCL 1 MG/ML IJ SOLN: 0.2500 mg | INTRAMUSCULAR | Status: DC | PRN

## 2017-08-03 MED ORDER — METOPROLOL TARTRATE 50 MG PO TABS
50.0000 mg | ORAL_TABLET | Freq: Once | ORAL | Status: AC
  Administered 2017-08-03: 50 mg via ORAL
  Filled 2017-08-03: qty 1

## 2017-08-03 MED ORDER — FENTANYL CITRATE (PF) 250 MCG/5ML IJ SOLN
INTRAMUSCULAR | Status: AC
  Filled 2017-08-03: qty 5

## 2017-08-03 MED ORDER — MIDAZOLAM HCL 5 MG/5ML IJ SOLN
INTRAMUSCULAR | Status: DC | PRN
  Administered 2017-08-03: 1 mg via INTRAVENOUS

## 2017-08-03 MED ORDER — LIDOCAINE-EPINEPHRINE 0.5 %-1:200000 IJ SOLN
INTRAMUSCULAR | Status: AC
  Filled 2017-08-03: qty 1

## 2017-08-03 MED ORDER — SODIUM CHLORIDE 0.9 % IV SOLN
INTRAVENOUS | Status: AC
  Filled 2017-08-03: qty 1.2

## 2017-08-03 MED ORDER — PROPOFOL 500 MG/50ML IV EMUL
INTRAVENOUS | Status: DC | PRN
  Administered 2017-08-03: 50 ug/kg/min via INTRAVENOUS

## 2017-08-03 MED ORDER — FENTANYL CITRATE (PF) 100 MCG/2ML IJ SOLN
INTRAMUSCULAR | Status: DC | PRN
  Administered 2017-08-03: 50 ug via INTRAVENOUS

## 2017-08-03 MED ORDER — MIDAZOLAM HCL 2 MG/2ML IJ SOLN
INTRAMUSCULAR | Status: AC
  Filled 2017-08-03: qty 2

## 2017-08-03 MED ORDER — LIDOCAINE-EPINEPHRINE 0.5 %-1:200000 IJ SOLN
INTRAMUSCULAR | Status: DC | PRN
  Administered 2017-08-03: 3.4 mL

## 2017-08-03 MED ORDER — 0.9 % SODIUM CHLORIDE (POUR BTL) OPTIME
TOPICAL | Status: DC | PRN
  Administered 2017-08-03: 1000 mL

## 2017-08-03 MED ORDER — SODIUM CHLORIDE 0.9 % IV SOLN
INTRAVENOUS | Status: DC | PRN
  Administered 2017-08-03: 500 mL

## 2017-08-03 MED ORDER — PROPOFOL 10 MG/ML IV BOLUS
INTRAVENOUS | Status: DC | PRN
  Administered 2017-08-03: 10 mg via INTRAVENOUS

## 2017-08-03 MED ORDER — ONDANSETRON HCL 4 MG/2ML IJ SOLN
INTRAMUSCULAR | Status: DC | PRN
  Administered 2017-08-03: 4 mg via INTRAVENOUS

## 2017-08-03 MED ORDER — METOPROLOL TARTRATE 50 MG PO TABS
ORAL_TABLET | ORAL | Status: AC
  Administered 2017-08-03: 50 mg via ORAL
  Filled 2017-08-03: qty 1

## 2017-08-03 SURGICAL SUPPLY — 30 items
ADH SKN CLS APL DERMABOND .7 (GAUZE/BANDAGES/DRESSINGS) ×1
ARMBAND PINK RESTRICT EXTREMIT (MISCELLANEOUS) ×4 IMPLANT
BAG DECANTER FOR FLEXI CONT (MISCELLANEOUS) ×1 IMPLANT
CANISTER SUCT 3000ML PPV (MISCELLANEOUS) ×2 IMPLANT
CANNULA VESSEL 3MM 2 BLNT TIP (CANNULA) ×2 IMPLANT
CLIP LIGATING EXTRA MED SLVR (CLIP) ×2 IMPLANT
CLIP LIGATING EXTRA SM BLUE (MISCELLANEOUS) ×2 IMPLANT
CLIP VESOCCLUDE MED 6/CT (CLIP) ×1 IMPLANT
CLIP VESOCCLUDE SM WIDE 6/CT (CLIP) ×1 IMPLANT
COVER PROBE W GEL 5X96 (DRAPES) ×2 IMPLANT
DECANTER SPIKE VIAL GLASS SM (MISCELLANEOUS) ×2 IMPLANT
DERMABOND ADVANCED (GAUZE/BANDAGES/DRESSINGS) ×1
DERMABOND ADVANCED .7 DNX12 (GAUZE/BANDAGES/DRESSINGS) ×1 IMPLANT
ELECT REM PT RETURN 9FT ADLT (ELECTROSURGICAL) ×2
ELECTRODE REM PT RTRN 9FT ADLT (ELECTROSURGICAL) ×1 IMPLANT
GLOVE SS BIOGEL STRL SZ 7.5 (GLOVE) ×1 IMPLANT
GLOVE SUPERSENSE BIOGEL SZ 7.5 (GLOVE) ×1
GOWN STRL REUS W/ TWL LRG LVL3 (GOWN DISPOSABLE) ×3 IMPLANT
GOWN STRL REUS W/TWL LRG LVL3 (GOWN DISPOSABLE) ×6
KIT BASIN OR (CUSTOM PROCEDURE TRAY) ×2 IMPLANT
KIT TURNOVER KIT B (KITS) ×2 IMPLANT
NS IRRIG 1000ML POUR BTL (IV SOLUTION) ×2 IMPLANT
PACK CV ACCESS (CUSTOM PROCEDURE TRAY) ×2 IMPLANT
PAD ARMBOARD 7.5X6 YLW CONV (MISCELLANEOUS) ×4 IMPLANT
SUT PROLENE 6 0 CC (SUTURE) ×2 IMPLANT
SUT VIC AB 3-0 SH 27 (SUTURE) ×2
SUT VIC AB 3-0 SH 27X BRD (SUTURE) ×1 IMPLANT
TOWEL GREEN STERILE (TOWEL DISPOSABLE) ×2 IMPLANT
UNDERPAD 30X30 (UNDERPADS AND DIAPERS) ×2 IMPLANT
WATER STERILE IRR 1000ML POUR (IV SOLUTION) ×2 IMPLANT

## 2017-08-03 NOTE — Anesthesia Preprocedure Evaluation (Signed)
Anesthesia Evaluation  Patient identified by MRN, date of birth, ID band Patient awake    Reviewed: Allergy & Precautions, NPO status , Patient's Chart, lab work & pertinent test results  Airway Mallampati: I  TM Distance: >3 FB Neck ROM: Full    Dental   Pulmonary    Pulmonary exam normal        Cardiovascular hypertension, + CAD and + CABG  Normal cardiovascular exam     Neuro/Psych    GI/Hepatic GERD  Medicated and Controlled,  Endo/Other  diabetes, Type 2, Oral Hypoglycemic Agents  Renal/GU Renal InsufficiencyRenal disease     Musculoskeletal   Abdominal   Peds  Hematology   Anesthesia Other Findings   Reproductive/Obstetrics                             Anesthesia Physical Anesthesia Plan  ASA: III  Anesthesia Plan: MAC   Post-op Pain Management:    Induction: Intravenous  PONV Risk Score and Plan: 2  Airway Management Planned: Simple Face Mask  Additional Equipment:   Intra-op Plan:   Post-operative Plan:   Informed Consent: I have reviewed the patients History and Physical, chart, labs and discussed the procedure including the risks, benefits and alternatives for the proposed anesthesia with the patient or authorized representative who has indicated his/her understanding and acceptance.     Plan Discussed with: CRNA and Surgeon  Anesthesia Plan Comments:         Anesthesia Quick Evaluation

## 2017-08-03 NOTE — Transfer of Care (Signed)
Immediate Anesthesia Transfer of Care Note  Patient: Dana Jackson  Procedure(s) Performed: ARTERIOVENOUS (AV) FISTULA CREATION LEFT ARM (Left )  Patient Location: PACU  Anesthesia Type:MAC  Level of Consciousness: awake, alert  and patient cooperative  Airway & Oxygen Therapy: Patient Spontanous Breathing  Post-op Assessment: Report given to RN and Post -op Vital signs reviewed and stable  Post vital signs: Reviewed and stable  Last Vitals:  Vitals Value Taken Time  BP 139/68 08/03/2017 10:22 AM  Temp    Pulse 80 08/03/2017 10:23 AM  Resp 9 08/03/2017 10:23 AM  SpO2 95 % 08/03/2017 10:23 AM  Vitals shown include unvalidated device data.  Last Pain:  Vitals:   08/03/17 0802  TempSrc: Oral  PainSc: 0-No pain      Patients Stated Pain Goal: 5 (86/76/72 0947)  Complications: No apparent anesthesia complications

## 2017-08-03 NOTE — Interval H&P Note (Signed)
History and Physical Interval Note:  08/03/2017 8:43 AM  Dana Jackson  has presented today for surgery, with the diagnosis of CHRONIC KIDNEY DISEASE STAGE IV FOR HEMODIALYSIS ACCESS  The various methods of treatment have been discussed with the patient and family. After consideration of risks, benefits and other options for treatment, the patient has consented to  Procedure(s): ARTERIOVENOUS (AV) FISTULA CREATION LEFT ARM (Left) as a surgical intervention .  The patient's history has been reviewed, patient examined, no change in status, stable for surgery.  I have reviewed the patient's chart and labs.  Questions were answered to the patient's satisfaction.     Curt Jews

## 2017-08-03 NOTE — Anesthesia Procedure Notes (Signed)
Procedure Name: MAC Date/Time: 08/03/2017 9:21 AM Performed by: White, Amedeo Plenty, CRNA Pre-anesthesia Checklist: Patient identified, Emergency Drugs available, Suction available and Patient being monitored Patient Re-evaluated:Patient Re-evaluated prior to induction Oxygen Delivery Method: Simple face mask

## 2017-08-03 NOTE — Op Note (Signed)
    OPERATIVE REPORT  DATE OF SURGERY: 08/03/2017  PATIENT: Dana Jackson, 72 y.o. female MRN: [id number]  DOB: [birth year]  PRE-OPERATIVE DIAGNOSIS: Chronic renal insufficiency  POST-OPERATIVE DIAGNOSIS:  Same  PROCEDURE: Left brachiocephalic AV fistula  SURGEON:  Curt Jews, M.D.  PHYSICIAN ASSISTANT: Liana Crocker, PA-C  ANESTHESIA: MAC  EBL: per anesthesia record  Total I/O In: 500 [I.V.:500] Out: 5 [Blood:5]  BLOOD ADMINISTERED: none  DRAINS: none  SPECIMEN: none  COUNTS CORRECT:  YES  PATIENT DISPOSITION:  PACU - hemodynamically stable  PROCEDURE DETAILS: Patient was taken to the operative placed position where the area the left arm was prepped and draped in usual sterile fashion.  SonoSite ultrasound was used to visualize the veins.  Vein mapping showed a patent adequate vein at the antecubital space and this did appear to be approximately 3 to 4 mm in diameter.  Using local anesthesia an incision was made over the antecubital space and could not isolate the cephalic vein in the brachial artery.  Tributary branches were ligated and divided.  The artery was ligated distally and was divided and was mobilized to the level of the brachial artery.  The artery was occluded proximally distally and was opened with an 11 blade and sent longitudinally with Potts scissors.  The vein was cut to the appropriate length and was spatulated and sewn end-to-side to the artery with a running 6-0 Prolene suture.  Removed and good flow was noted in the fistula.  The wound was irrigated with saline.  Hemostasis was obtained with a letter cautery.  The wound was closed with 3-0 Vicryl in the subcutaneous and subcuticular tissue.  Sterile dressing was applied and the patient was transferred to the recovery room in stable condition   Dana Jackson, M.D., [facility] 08/03/2017 10:26 AM

## 2017-08-03 NOTE — Progress Notes (Signed)
Report given to [facility] [place] rn

## 2017-08-03 NOTE — Discharge Instructions (Signed)
° °  [facility]  Discharge Instructions  AV Fistula or Graft Surgery for Dialysis Access  Please refer to the following instructions for your post-procedure care. Your surgeon or physician assistant will discuss any changes with you.  Activity  You may drive the day following your surgery, if you are comfortable and no longer taking prescription pain medication. Resume full activity as the soreness in your incision resolves.  Bathing/Showering  You may shower after you go home. Keep your incision dry for 48 hours. Do not soak in a bathtub, hot tub, or swim until the incision heals completely. You may not shower if you have a hemodialysis catheter.  Incision Care  Clean your incision with mild soap and water after 48 hours. Pat the area dry with a clean towel. You do not need a bandage unless otherwise instructed. Do not apply any ointments or creams to your incision. You may have skin glue on your incision. Do not peel it off. It will come off on its own in about one week. Your arm may swell a bit after surgery. To reduce swelling use pillows to elevate your arm so it is above your heart. Your doctor will tell you if you need to lightly wrap your arm with an ACE bandage.  Diet  Resume your normal diet. There are not special food restrictions following this procedure. In order to heal from your surgery, it is CRITICAL to get adequate nutrition. Your body requires vitamins, minerals, and protein. Vegetables are the best source of vitamins and minerals. Vegetables also provide the perfect balance of protein. Processed food has little nutritional value, so try to avoid this.  Medications  Resume taking all of your medications. If your incision is causing pain, you may take over-the counter pain relievers such as acetaminophen (Tylenol). If you were prescribed a stronger pain medication, please be aware these medications can cause nausea and constipation. Prevent  nausea by taking the medication with a snack or meal. Avoid constipation by drinking plenty of fluids and eating foods with high amount of fiber, such as fruits, vegetables, and grains.  Do not take Tylenol if you are taking prescription pain medications.  Follow up Your surgeon may want to see you in the office following your access surgery. If so, this will be arranged at the time of your surgery.  Please call us immediately for any of the following conditions:  Increased pain, redness, drainage (pus) from your incision site Fever of 101 degrees or higher Severe or worsening pain at your incision site Hand pain or numbness.  Reduce your risk of vascular disease:  Stop smoking. If you would like help, call [facility] at 1-800-QUIT-NOW (918)738-7557) or [place] at [place] your cholesterol Maintain a desired weight Control your diabetes Keep your blood pressure down  Dialysis  It will take several weeks to several months for your new dialysis access to be ready for use. Your surgeon will determine when it is okay to use it. Your nephrologist will continue to direct your dialysis. You can continue to use your Permcath until your new access is ready for use.   08/03/2017 Dana Jackson [id number] 1945-02-14  Surgeon(s): Early, Arvilla Meres, MD  Procedure(s): Creation left brachiocephalic AV fistula  x Do not stick fistula for 12 weeks    If you have any questions, please call the office at [phone].

## 2017-08-03 NOTE — Anesthesia Postprocedure Evaluation (Signed)
Anesthesia Post Note  Patient: Tyrene Nader  Procedure(s) Performed: ARTERIOVENOUS (AV) FISTULA CREATION LEFT ARM (Left )     Patient location during evaluation: PACU Anesthesia Type: MAC Level of consciousness: awake and alert Pain management: pain level controlled Vital Signs Assessment: post-procedure vital signs reviewed and stable Respiratory status: spontaneous breathing, nonlabored ventilation, respiratory function stable and patient connected to nasal cannula oxygen Cardiovascular status: stable and blood pressure returned to baseline Postop Assessment: no apparent nausea or vomiting Anesthetic complications: no    Last Vitals:  Vitals:   08/03/17 1136 08/03/17 1147  BP: 105/62 (!) 124/50  Pulse: 67 67  Resp: 10 11  Temp:  36.5 C  SpO2: 92% 93%    Last Pain:  Vitals:   08/03/17 1147  TempSrc:   PainSc: 0-No pain                 Ekaterini Capitano DAVID

## 2017-08-04 ENCOUNTER — Telehealth: Payer: Self-pay | Admitting: Vascular Surgery

## 2017-08-04 ENCOUNTER — Encounter (HOSPITAL_COMMUNITY): Payer: Self-pay | Admitting: Vascular Surgery

## 2017-08-04 NOTE — Telephone Encounter (Signed)
sch appt spk to pt 09/15/17 12pm Dialysis Duplex 1pm p/o PA

## 2017-08-10 ENCOUNTER — Other Ambulatory Visit: Payer: Self-pay

## 2017-08-10 DIAGNOSIS — Z48812 Encounter for surgical aftercare following surgery on the circulatory system: Secondary | ICD-10-CM

## 2017-08-10 DIAGNOSIS — N189 Chronic kidney disease, unspecified: Secondary | ICD-10-CM

## 2017-08-19 ENCOUNTER — Other Ambulatory Visit (HOSPITAL_COMMUNITY): Payer: Self-pay | Admitting: Urology

## 2017-08-19 DIAGNOSIS — D49512 Neoplasm of unspecified behavior of left kidney: Secondary | ICD-10-CM

## 2017-08-19 DIAGNOSIS — D4102 Neoplasm of uncertain behavior of left kidney: Secondary | ICD-10-CM

## 2017-08-26 ENCOUNTER — Ambulatory Visit (HOSPITAL_COMMUNITY)
Admission: RE | Admit: 2017-08-26 | Discharge: 2017-08-26 | Disposition: A | Discharge status: Home / Self Care | Payer: Medicare Other | Source: Ambulatory Visit | Attending: Urology | Admitting: Urology

## 2017-08-26 DIAGNOSIS — D49512 Neoplasm of unspecified behavior of left kidney: Secondary | ICD-10-CM | POA: Diagnosis not present

## 2017-08-26 DIAGNOSIS — Z905 Acquired absence of kidney: Secondary | ICD-10-CM | POA: Diagnosis not present

## 2017-08-26 DIAGNOSIS — D4102 Neoplasm of uncertain behavior of left kidney: Secondary | ICD-10-CM

## 2017-08-26 DIAGNOSIS — K862 Cyst of pancreas: Secondary | ICD-10-CM | POA: Insufficient documentation

## 2017-08-26 DIAGNOSIS — Q6102 Congenital multiple renal cysts: Secondary | ICD-10-CM | POA: Insufficient documentation

## 2017-09-09 ENCOUNTER — Other Ambulatory Visit: Payer: Self-pay | Admitting: Cardiology

## 2017-09-09 NOTE — Telephone Encounter (Signed)
Rx sent to pharmacy   

## 2017-09-15 ENCOUNTER — Other Ambulatory Visit: Payer: Self-pay

## 2017-09-15 ENCOUNTER — Other Ambulatory Visit: Payer: Self-pay | Admitting: Cardiology

## 2017-09-15 ENCOUNTER — Ambulatory Visit (HOSPITAL_COMMUNITY)
Admission: RE | Admit: 2017-09-15 | Discharge: 2017-09-15 | Disposition: A | Discharge status: Home / Self Care | Payer: Medicare Other | Source: Ambulatory Visit | Attending: Vascular Surgery | Admitting: Vascular Surgery

## 2017-09-15 ENCOUNTER — Ambulatory Visit (INDEPENDENT_AMBULATORY_CARE_PROVIDER_SITE_OTHER): Payer: Self-pay | Admitting: Physician Assistant

## 2017-09-15 VITALS — BP 139/82 | HR 62 | Temp 96.9°F | Resp 14 | Ht 60.0 in | Wt 150.0 lb

## 2017-09-15 DIAGNOSIS — N189 Chronic kidney disease, unspecified: Secondary | ICD-10-CM

## 2017-09-15 DIAGNOSIS — Z48812 Encounter for surgical aftercare following surgery on the circulatory system: Secondary | ICD-10-CM | POA: Diagnosis not present

## 2017-09-15 NOTE — Progress Notes (Signed)
POST OPERATIVE OFFICE NOTE    CC:  F/u for surgery  HPI:  This is a 72 y.o. female who is s/p who underwent left BC AVF on 08/03/17 by Dr. Donnetta Hutching.  She states that she does not have any pain in her hand.  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  She is not yet on dialysis.  She has a hx of kidney cancer with partial nephrectomy and prior hx of endometrial cancer.    Upon talking to pt, she mentions her mother was supposed to go on dialysis but died the night before and never went on HD.  She tells me that she did not die of renal failure, but had a ruptured aneurysm in her abdomen.  Pt states she had an MRI a couple of weeks ago, but was without contrast.  There is no record of her having an abdominal ultrasound.    She returns today for duplex and follow up.    Her nephrologist is Dr. Joelyn Oms and urologist is Dr. Alinda Money.  Allergies  Allergen Reactions  . Codeine Nausea Only  . Erythromycin Itching  . Lopid [Gemfibrozil] Itching  . Pravastatin Other (See Comments)    Used 01/2011 to 03/2011  . Simvastatin Other (See Comments)    Used 07/2007 to 11/2010 chg to pravastatin  . Sulfa Antibiotics     aki     Current Outpatient Medications  Medication Sig Dispense Refill  . Alpha-Lipoic Acid 600 MG CAPS Take 600 mg by mouth daily.    [name] aspirin 81 MG tablet Take 81 mg by mouth every evening.     [name] atorvastatin (LIPITOR) 40 MG tablet Take 40 mg by mouth at bedtime.     . calcitRIOL (ROCALTROL) 0.25 MCG capsule Take 0.25 mcg by mouth daily.    . furosemide (LASIX) 20 MG tablet Take 20 mg by mouth every evening.   1  . gabapentin (NEURONTIN) 100 MG capsule Take 100-200 mg by mouth 2 (two) times daily. Take 100mg  by mouth in the morning and 200mg  at bedtime    . glipiZIDE (GLUCOTROL) 5 MG tablet Take 2.5 mg by mouth 2 (two) times daily before a meal.     . HYDROcodone-homatropine (HYCODAN) 5-1.5 MG/5ML syrup Take 5 mLs by mouth every 6 (six) hours as needed for cough.     .  isosorbide mononitrate (IMDUR) 30 MG 24 hr tablet TAKE 1 TABLET BY MOUTH EVERY DAY 90 tablet 2  . loperamide (IMODIUM) 2 MG capsule Take 2 mg by mouth 4 (four) times daily as needed for diarrhea or loose stools.    [name] loratadine (CLARITIN) 10 MG tablet Take 10 mg by mouth daily.    . magnesium oxide (MAG-OX) 400 MG tablet Take 400 mg by mouth daily.    . metoprolol tartrate (LOPRESSOR) 50 MG tablet TAKE 1 TABLET BY MOUTH TWICE A DAY 180 tablet 1  . nitroGLYCERIN (NITROSTAT) 0.4 MG SL tablet PLACE 1 TABLET (0.4 MG TOTAL) UNDER THE TONGUE EVERY 5 (FIVE) MINUTES AS NEEDED FOR CHEST PAIN. 25 tablet 3  . omeprazole (PRILOSEC) 40 MG capsule Take 40 mg by mouth daily.      [name] oxyCODONE-acetaminophen (PERCOCET) 5-325 MG tablet Take 1 tablet by mouth every 6 (six) hours as needed for severe pain. 6 tablet 0  . PROAIR HFA 108 (90 Base) MCG/ACT inhaler Inhale 2 puffs into the lungs every 6 (six) hours as needed for wheezing or shortness of breath.     [name]  Probiotic Product (ALIGN PO) Take 1 capsule by mouth daily.     . promethazine (PHENERGAN) 25 MG tablet Take 25 mg by mouth every 6 (six) hours as needed for nausea or vomiting.     . pyridOXINE (VITAMIN B-6) 100 MG tablet Take 600 mg by mouth daily.     . ranitidine (ZANTAC) 300 MG tablet Take 300 mg by mouth at bedtime.      No current facility-administered medications for this visit.      ROS:  See HPI  Physical Exam:  Vitals:   09/15/17 1245 09/15/17 1252  BP: (!) 162/78 139/82  Pulse: 62 62  Resp: 14   Temp: (!) 96.9 F (36.1 C)   SpO2: 98%    Vitals:   09/15/17 1245  Weight: 150 lb (68 kg)  Height: 5' (1.524 m)   Body mass index is 29.29 kg/m.  Incision:  Healed nicely Extremities:  +palpable left radial pulse; there is a thrill in the fistula as well as a bruit; it is easily palpable distally but although palpable proximally, more difficult to fee.  Unable to palpate popliteal pulses.   Abdomen:  No palpable  masses.  Assessment/Plan:  This is a 72 y.o. female who is s/p: left BC AVF on 08/03/17 by Dr. Donnetta Hutching.  -pt's fistula is maturing with exception of about 1cm in the mid upper arm.  Given that she is not yet on dialysis, will give this more time and have her come back in 6 weeks and repeat duplex.  -upon talking with pt, her mother died of ruptured AAA-she has not had an ultrasound of her aorta.  I do not feel a pulsatile mass on her abdominal exam and I am unable to palpate popliteal pulses.   When she returns in 6 weeks, will get screening AAA u/s at that time given family hx.    Leontine Locket, PA-C [facility] [phone]  Clinic MD:  Oneida Alar

## 2017-09-16 ENCOUNTER — Other Ambulatory Visit: Payer: Self-pay

## 2017-09-16 DIAGNOSIS — Z8249 Family history of ischemic heart disease and other diseases of the circulatory system: Secondary | ICD-10-CM

## 2017-09-16 DIAGNOSIS — N184 Chronic kidney disease, stage 4 (severe): Secondary | ICD-10-CM

## 2017-11-01 ENCOUNTER — Ambulatory Visit (HOSPITAL_COMMUNITY)
Admission: RE | Admit: 2017-11-01 | Discharge: 2017-11-01 | Disposition: A | Discharge status: Home / Self Care | Payer: Medicare Other | Source: Ambulatory Visit | Attending: Vascular Surgery | Admitting: Vascular Surgery

## 2017-11-01 ENCOUNTER — Ambulatory Visit: Payer: Medicare Other | Admitting: Physician Assistant

## 2017-11-01 ENCOUNTER — Ambulatory Visit (INDEPENDENT_AMBULATORY_CARE_PROVIDER_SITE_OTHER)
Admission: RE | Admit: 2017-11-01 | Discharge: 2017-11-01 | Disposition: A | Discharge status: Home / Self Care | Payer: Medicare Other | Source: Ambulatory Visit | Attending: Vascular Surgery | Admitting: Vascular Surgery

## 2017-11-01 ENCOUNTER — Other Ambulatory Visit: Payer: Self-pay

## 2017-11-01 VITALS — BP 133/73 | HR 70 | Temp 97.7°F | Resp 14 | Ht 60.0 in | Wt 152.0 lb

## 2017-11-01 DIAGNOSIS — N184 Chronic kidney disease, stage 4 (severe): Secondary | ICD-10-CM | POA: Diagnosis not present

## 2017-11-01 DIAGNOSIS — Z8249 Family history of ischemic heart disease and other diseases of the circulatory system: Secondary | ICD-10-CM

## 2017-11-01 DIAGNOSIS — Z136 Encounter for screening for cardiovascular disorders: Secondary | ICD-10-CM | POA: Diagnosis not present

## 2017-11-01 DIAGNOSIS — Z8489 Family history of other specified conditions: Secondary | ICD-10-CM | POA: Insufficient documentation

## 2017-11-01 NOTE — Progress Notes (Signed)
    Postoperative Access Visit   History of Present Illness   Dana Jackson is a 72 y.o. year old female who presents for postoperative follow-up for: left brachiocephalic arteriovenous fistula created by Dr. Donnetta Hutching 08/03/17.  The patient's wounds are healed.  The patient denies steal symptoms.  The patient is able to complete their activities of daily living.  She is not yet on hemodialysis.  CKD is managed by Dr. Joelyn Oms with whom she has a follow up appointment next month.  She also underwent AAA screening by duplex today.  She denies new or changing abd or back pain.  She believes her mother passed away due to a ruptured AAA.  Physical Examination   Vitals:   11/01/17 1354  BP: 133/73  Pulse: 70  Resp: 14  Temp: 97.7 F (36.5 C)  TempSrc: Oral  SpO2: 97%  Weight: 152 lb (68.9 kg)  Height: 5' (1.524 m)   Body mass index is 29.69 kg/m.  left arm Incision is healed, hand grip is 5/5, sensation in digits is intact, palpable thrill, bruit can be auscultated; palpable L radial pulse; palpable narrowing in distal upper arm however there is a palpable thrill beyond area of narrowing     Medical Decision Making   Dana Jackson is a 72 y.o. year old female who presents s/p left brachiocephalic arteriovenous fistula   Patent left arm fistula  Duplex shows an area of narrowing in fistula however there is a palpable thrill beyond this area  Discussed with patient that there is always a risk of thrombosis of fistula due to stenotic area  Patient would like to wait until she is closer to initiation of HD prior to consenting to fistulogram with likely angioplasty of cephalic vein  AAA duplex negative  Follow up as needed for now   Dagoberto Ligas PA-C [facility] [place] Office: [phone]

## 2018-07-12 DIAGNOSIS — M810 Age-related osteoporosis without current pathological fracture: Secondary | ICD-10-CM | POA: Insufficient documentation

## 2018-07-25 ENCOUNTER — Other Ambulatory Visit (HOSPITAL_COMMUNITY): Payer: Self-pay | Admitting: Urology

## 2018-07-25 ENCOUNTER — Other Ambulatory Visit: Payer: Self-pay | Admitting: Urology

## 2018-07-25 DIAGNOSIS — D49512 Neoplasm of unspecified behavior of left kidney: Secondary | ICD-10-CM

## 2018-09-08 ENCOUNTER — Other Ambulatory Visit: Payer: Self-pay | Admitting: Cardiology

## 2018-09-20 ENCOUNTER — Ambulatory Visit (HOSPITAL_COMMUNITY)
Admission: RE | Admit: 2018-09-20 | Discharge: 2018-09-20 | Disposition: A | Discharge status: Home / Self Care | Payer: Medicare Other | Source: Ambulatory Visit | Attending: Urology | Admitting: Urology

## 2018-09-20 ENCOUNTER — Other Ambulatory Visit: Payer: Self-pay

## 2018-09-20 DIAGNOSIS — D49512 Neoplasm of unspecified behavior of left kidney: Secondary | ICD-10-CM | POA: Diagnosis not present

## 2018-10-04 ENCOUNTER — Other Ambulatory Visit: Payer: Self-pay | Admitting: Cardiology

## 2018-11-01 ENCOUNTER — Other Ambulatory Visit: Payer: Self-pay | Admitting: Cardiology

## 2018-12-13 ENCOUNTER — Other Ambulatory Visit: Payer: Self-pay | Admitting: Cardiology

## 2018-12-20 ENCOUNTER — Other Ambulatory Visit: Payer: Self-pay | Admitting: Cardiology

## 2018-12-31 ENCOUNTER — Other Ambulatory Visit: Payer: Self-pay | Admitting: Cardiology

## 2019-01-01 NOTE — Telephone Encounter (Signed)
Rx(s) sent to pharmacy electronically.  

## 2019-01-03 ENCOUNTER — Other Ambulatory Visit: Payer: Self-pay | Admitting: Cardiology

## 2019-01-03 MED ORDER — ISOSORBIDE MONONITRATE ER 30 MG PO TB24: ORAL_TABLET | ORAL | 0 refills | Status: DC

## 2019-01-03 NOTE — Telephone Encounter (Signed)
*  STAT* If patient is at the pharmacy, call can be transferred to refill team.   1. Which medications need to be refilled? (please list name of each medication and dose if known) isosorbide mononitrate (IMDUR) 30 MG 24 hr tablet  2. Which pharmacy/location (including street and city if local pharmacy) is medication to be sent to? [id number] - [place], [place] - [address]  3. Do they need a 30 day or 90 day supply? 30 day   Patient is currently out of medication. Has appointment scheduled regarding it on 01/19/19.

## 2019-01-19 ENCOUNTER — Ambulatory Visit (INDEPENDENT_AMBULATORY_CARE_PROVIDER_SITE_OTHER): Payer: Medicare PPO | Admitting: Cardiology

## 2019-01-19 ENCOUNTER — Other Ambulatory Visit: Payer: Self-pay

## 2019-01-19 ENCOUNTER — Encounter: Payer: Self-pay | Admitting: Cardiology

## 2019-01-19 VITALS — BP 154/70 | HR 73 | Temp 95.2°F | Ht 60.0 in | Wt 151.0 lb

## 2019-01-19 DIAGNOSIS — I10 Essential (primary) hypertension: Secondary | ICD-10-CM | POA: Diagnosis not present

## 2019-01-19 DIAGNOSIS — N184 Chronic kidney disease, stage 4 (severe): Secondary | ICD-10-CM

## 2019-01-19 DIAGNOSIS — I25708 Atherosclerosis of coronary artery bypass graft(s), unspecified, with other forms of angina pectoris: Secondary | ICD-10-CM | POA: Diagnosis not present

## 2019-01-19 DIAGNOSIS — E785 Hyperlipidemia, unspecified: Secondary | ICD-10-CM | POA: Diagnosis not present

## 2019-01-19 DIAGNOSIS — I25119 Atherosclerotic heart disease of native coronary artery with unspecified angina pectoris: Secondary | ICD-10-CM | POA: Diagnosis not present

## 2019-01-19 MED ORDER — ISOSORBIDE MONONITRATE ER 60 MG PO TB24: 60.0000 mg | ORAL_TABLET | Freq: Every day | ORAL | 3 refills | Status: DC

## 2019-01-19 NOTE — Progress Notes (Signed)
Primary Care Provider: Heywood Bene, PA-C Cardiologist: No primary care provider on file. Electrophysiologist:   Clinic Note: Chief Complaint  Patient presents with  . Follow-up    18 months.  . Coronary Artery Disease    No recurrent angina, dyspnea exertional dyspnea.    HPI:    Dana Jackson is a 74 y.o. female with a PMH notable for CAD-CABG and now CKD IV (history of RCCA) along with longstanding history of endometrial cancer-status post THA/BSO and chemoradiation (with recurrence).  [place] presents today for essentially 50-monthfollow-up..[name] Dana Jackson last seen in July 2019.  She was doing well and stable from cardiac standpoint.  Was doing exercise routine level without any significant problems.  Had recently recovered from pneumonia but otherwise doing well.  Recent Hospitalizations: none  Reviewed  CV studies:    The following studies were reviewed today: (if available, images/films reviewed: From Epic Chart or Care Everywhere) . October 2019 AAA Doppler: No evidence of AAA.  Largest aortic diameter is 1.9 cm.   Interval History:   Dana Deliareturns here for delayed follow-up really doing quite well overall from a cardiac standpoint.  She has occasional shortness of breath with or without exertion, but denies any real association with any chest pain or pressure.  She is not all that active, and these shortness of breath episodes are relatively infrequent.  She has off-and-on coughing which is pretty much baseline for her.  Intermittent palpitations but nothing prolonged.  She says at home her systolic blood pressures usually run anywhere from the 120s 130s, but she is not sure.  We will follow along.  CV Review of Symptoms (Summary) positive for - dyspnea on exertion and Relatively infrequently.  Also notes an occasional palpitations negative for - chest pain, edema, irregular heartbeat, orthopnea, paroxysmal nocturnal dyspnea, rapid heart  rate, shortness of breath or Syncope/near syncope, TIA/amaurosis fugax.  Claudication  The patient does not have symptoms concerning for COVID-19 infection (fever, chills, cough, or new shortness of breath).  The patient is practicing social distancing and Masking.  Rarely goes out for groceries/shopping.   REVIEWED OF SYSTEMS   A comprehensive ROS was performed. Review of Systems  Constitutional: Negative for malaise/fatigue (Not a lot of energy) and weight loss.  HENT: Negative for nosebleeds.   Respiratory: Positive for cough (Off-and-on, nonproductive.  Chronic). Negative for shortness of breath and wheezing.   Cardiovascular: Negative for claudication.  Gastrointestinal: Negative for blood in stool and melena.  Genitourinary: Negative for hematuria.  Musculoskeletal: Negative for falls and joint pain.  Neurological: Negative for dizziness, focal weakness and headaches.  Psychiatric/Behavioral: Negative.   All other systems reviewed and are negative.   I have reviewed and (if needed) personally updated the patient's problem list, medications, allergies, past medical and surgical history, social and family history.   PAST MEDICAL HISTORY   Past Medical History:  Diagnosis Date  . Anemia   . Arthritis    hands  . Asthma    childhood  . Bacteremia due to Escherichia coli June 2015    Currently being treated with anti-biotics  . CAD in native artery 12/2010   a) Cath for exertional angina & EKG changes: 40% LM, 80% mid LAD.  95% ost cX, 80-90% PDA --> CABG X3; b) Post CABG CATH for + Myoview with basal anterior ischemia -> Ost LAD 80% (diffuse) then 100% after SP2, RI 70% (too small for PCI), Ost-prox Cx 99% & OM1 90%, OstrPDA  80% (small); Occluded SVG-rPDA.  Patent LIMA-dLAD, SVG-OM: culprit ~ p-m LAD pre-LIMA & RI - not good PCI target --> Med Rx  . CKD (chronic kidney disease) stage 3, GFR 30-59 ml/min   . Degenerative disc disease, cervical    Degenerative disc disease,  cervical [722.4]  . Diabetes mellitus without complication ([place])    Type II  . Endometrial cancer [facility]) November 2015   Treated with TAH with pelvic lymphadenectomy followed by radiation and chemotherapy  . GERD (gastroesophageal reflux disease)   . Hearing loss    bilateral - no hearing aids  . Heart murmur   . History of asthma     childhood  . History of blood transfusion   . History of pneumonia    "2-3 times"  . History of unstable angina November/December 2012   T wave inversions in inferolateral leads.  No stress test performed.  . Hyperlipidemia LDL goal <70   . Hypertension, essential, benign   . Neuropathy    hands  . Pneumonia 2018  . Renal cell carcinoma ([place]) 11/18/2008   T2aNx s/p partial left nephrectomy  . 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 Stroke [facility])    TIA- no residual   . TIA (transient ischemic attack) 1992 &  2010     PAST SURGICAL HISTORY   Past Surgical History:  Procedure Laterality Date  . ANTERIOR CERVICAL DECOMP/DISCECTOMY FUSION  10/11/2005   multi-level  . AV FISTULA PLACEMENT Left 08/03/2017   Procedure: ARTERIOVENOUS (AV) FISTULA CREATION LEFT ARM;  Surgeon: Rosetta Posner, MD;  Location: [place];  Service: Vascular;  Laterality: Left;  . CARDIAC CATHETERIZATION  12/24/10   40% left main, 80% mid LAD, 95% ostial circumflex, 80-90% PDA.  [name] CARDIAC CATHETERIZATION N/A 07/01/2014   Procedure: Left Heart Cath and Cors/Grafts Angiography;  Surgeon: Leonie Man, MD;  Location: [facility] INVASIVE CV LAB:  For Abn Nuc @ [facility]: Ost LAD 80%, mid LAD 100% after S/P 2, 70% RI (no PCI target), ost-prox Cx 99%, OM1 90%. Ost rPDA 80% (~ pre-CABG), occluded SVG-rPDA, patent LIMA-dLAD, SVG OM; potential culprit for abn Nuc scan = pLAD Dz, small RI 70% or PDA -> med Rx  . CAROTID ENDARTERECTOMY Left   . CHOLECYSTECTOMY  ~ 1971  . COLONOSCOPY    . CORONARY ARTERY BYPASS GRAFT  12/25/2010   Procedure: CORONARY ARTERY BYPASS GRAFTING  (CABGX3 - LIMA-LAD, SVG RPL, SVG-Circumflex);  Surgeon: Rexene Alberts, MD;  Location: [place];  Service: Open Heart Surgery;  Laterality: N/A;  . DILATATION & CURETTAGE/HYSTEROSCOPY WITH MYOSURE N/A 11/02/2013   Procedure: DILATATION & CURETTAGE/HYSTEROSCOPY WITH MYOSURE ABLATION;  Surgeon: Allena Katz, MD;  Location: [facility];  Service: Gynecology;  Laterality: N/A;  . DOPPLER ECHOCARDIOGRAPHY  12/08/2010   EF =>55%,MILD CONCENTRIC LEFT VENTRICULAR HYPERTROPHY  . EYE SURGERY Bilateral    Cataract  . LEFT HEART CATHETERIZATION WITH CORONARY ANGIOGRAM N/A 12/24/2010   Procedure: LEFT HEART CATHETERIZATION WITH CORONARY ANGIOGRAM;  Surgeon: Leonie Man, MD;  Location: [facility] CATH LAB;  Service: Cardiovascular;  Laterality: N/A;  . NM MYOCAR SINGLE W/SPECT  07/26/2007   EF 79%, LEFT VENT.FUNCTION NORMAL  . PARTIAL NEPHRECTOMY Left 11/18/2008   left partial nephrectomy for renal cell CA  . PET Myocardial Perfusion Scan  06/13/2014   At [place]: Moderate size, mild severity completely reversible defect involving the basal anterior, mid anterior and apical anterior segments consistent with ischemia. EF  65% with normal global function.  . TONSILLECTOMY     "in college"  . TOTAL ABDOMINAL HYSTERECTOMY  November 2015    At [facility]: Robotic procedure with pelvic lymphadenectomy  . TRANSTHORACIC ECHOCARDIOGRAM  06/13/2014   At [place]: EF 60-65%. Grade 1 diastolic dysfunction. Mild MR. Aortic sclerosis. Moderate pulmonary hypertension  . TUBAL LIGATION  ~ 1984  . UPPER GI ENDOSCOPY    . WISDOM TOOTH EXTRACTION       MEDICATIONS/ALLERGIES   Current Meds  Medication Sig  . Alpha-Lipoic Acid 600 MG CAPS Take 600 mg by mouth daily.  [name] aspirin 81 MG tablet Take 81 mg by mouth every evening.   [name] atorvastatin (LIPITOR) 40 MG tablet Take 40 mg by mouth at bedtime.   . furosemide (LASIX) 20 MG tablet Take 20 mg by mouth every evening.   . gabapentin (NEURONTIN) 100 MG capsule  Take 100-200 mg by mouth 2 (two) times daily. Take 142m by mouth in the morning and 2026mat bedtime  . glipiZIDE (GLUCOTROL) 5 MG tablet Take 2.5 mg by mouth 2 (two) times daily before a meal.   . HYDROcodone-homatropine (HYCODAN) 5-1.5 MG/5ML syrup Take 5 mLs by mouth every 6 (six) hours as needed for cough.   . loperamide (IMODIUM) 2 MG capsule Take 2 mg by mouth 4 (four) times daily as needed for diarrhea or loose stools.  . Marland Kitchenoratadine (CLARITIN) 10 MG tablet Take 10 mg by mouth daily.  . magnesium oxide (MAG-OX) 400 MG tablet Take 400 mg by mouth daily.  . metoprolol tartrate (LOPRESSOR) 50 MG tablet Take 1 tablet (50 mg total) by mouth 2 (two) times daily. NEEDS APPOINTMENT FOR FUTURE REFILLS  . nitroGLYCERIN (NITROSTAT) 0.4 MG SL tablet PLACE 1 TABLET (0.4 MG TOTAL) UNDER THE TONGUE EVERY 5 (FIVE) MINUTES AS NEEDED FOR CHEST PAIN.  . Marland Kitchenmeprazole (PRILOSEC) 40 MG capsule Take 40 mg by mouth daily.    . Marland KitchenxyCODONE-acetaminophen (PERCOCET) 5-325 MG tablet Take 1 tablet by mouth every 6 (six) hours as needed for severe pain.  . Marland KitchenROAIR HFA 108 (90 Base) MCG/ACT inhaler Inhale 2 puffs into the lungs every 6 (six) hours as needed for wheezing or shortness of breath.   . Probiotic Product (ALIGN PO) Take 1 capsule by mouth daily.   . promethazine (PHENERGAN) 25 MG tablet Take 25 mg by mouth every 6 (six) hours as needed for nausea or vomiting.   . pyridOXINE (VITAMIN B-6) 100 MG tablet Take 600 mg by mouth daily.   . [DISCONTINUED] calcitRIOL (ROCALTROL) 0.25 MCG capsule Take 0.25 mcg by mouth daily.  . [DISCONTINUED] isosorbide mononitrate (IMDUR) 30 MG 24 hr tablet TAKE 1 TABLET BY MOUTH EVERY DAY **keep 01/28/2018 appointment*  . [DISCONTINUED] ranitidine (ZANTAC) 300 MG tablet Take 300 mg by mouth at bedtime.     Allergies  Allergen Reactions  . Erythromycin Itching  . Pravastatin Other (See Comments)    Used 01/2011 to 03/2011 Used 01/2011 to 03/2011 Used 01/2011 to 03/2011  . Simvastatin  Other (See Comments)    Used 07/2007 to 11/2010 chg to pravastatin Used 07/2007 to 11/2010 chg to pravastatin Used 07/2007 to 11/2010 chg to pravastatin  . Sulfa Antibiotics     aki   . Codeine Nausea Only and Other (See Comments)    Unknown/ OK to take Hydrocodone cough syrup  Unknown/ OK to take Hydrocodone cough syrup   . Gemfibrozil Itching and Other (See Comments)    unknown unknown  SOCIAL HISTORY/FAMILY HISTORY   Social History   Tobacco Use  . Smoking status: Never Smoker  . Smokeless tobacco: Never Used  Substance Use Topics  . Alcohol use: Not Currently    Alcohol/week: 0.0 standard drinks  . Drug use: No   Social History   Social History Narrative   She is a married Mother of 2.  -- Currently being very busy taking care of 2 foster children that are staying with her daughter.  A 68-year-old and a 13-year-old that they're trying to adopt.  She is very excited about the possibility of becoming a Grandmother.   She does walk and getting exercise, but she is wanting to get back into more activities just because she has really been limited due to her arthritic pains. She used to do things like walking and biking, and she may try to do some biking again, or at least stationary biking.    Does not smoke, does not drink.    Family History family history includes AAA (abdominal aortic aneurysm) in her mother; Coronary artery disease in her brother, father, and sister; GER disease in her mother; Heart disease in her brother and father; Multiple myeloma in her mother; Stroke in her maternal grandmother.   OBJCTIVE -PE, EKG, labs   Wt Readings from Last 3 Encounters:  01/19/19 151 lb (68.5 kg)  11/01/17 152 lb (68.9 kg)  09/15/17 150 lb (68 kg)    Physical Exam: BP (!) 154/70 (BP Location: Right Arm, Patient Position: Sitting, Cuff Size: Normal)   Pulse 73   Temp (!) 95.2 F (35.1 C)   Ht 5' (1.524 m)   Wt 151 lb (68.5 kg)   BMI 29.49 kg/m  Physical Exam   Constitutional: She is oriented to person, place, and time. She appears well-developed and well-nourished. No distress.  Relatively healthy-appearing, borderline obese.  Well-groomed.  HENT:  Head: Normocephalic and atraumatic.  Neck: No hepatojugular reflux and no JVD present. Carotid bruit is not present.  Cardiovascular: Normal rate, regular rhythm, S1 normal and normal pulses.  No extrasystoles are present. PMI is not displaced. Exam reveals no gallop and no friction rub.  Murmur (2/6 SEM at RUSB.) heard. Physiologically split S2  Pulmonary/Chest: Effort normal and breath sounds normal. No respiratory distress. She has no wheezes. She has no rales.  Abdominal: Soft. Bowel sounds are normal. There is no abdominal tenderness. There is no rebound.  No HSM.  Musculoskeletal:        General: No edema. Normal range of motion.     Cervical back: Normal range of motion and neck supple.  Neurological: She is alert and oriented to person, place, and time.  Psychiatric: She has a normal mood and affect. Her behavior is normal. Judgment and thought content normal.  Vitals reviewed.    Adult ECG Report  Rate: 73 ;  Rhythm: normal sinus rhythm and Left atrial abnormality.  Otherwise normal axis, intervals and durations.;   Narrative Interpretation: Stable EKG.  Recent Labs:  Did not get checked in 2019 as ordered   Lab Results  Component Value Date   CHOL 197 09/30/2015   HDL 29 (L) 09/30/2015   LDLCALC 102 (H) 09/30/2015   TRIG 331 (H) 09/30/2015   CHOLHDL 2.5 12/24/2010   Lab Results  Component Value Date   CREATININE 2.70 (H) 05/13/2017   BUN 42 (H) 05/13/2017   NA 139 08/03/2017   K 4.4 08/03/2017   CL 110 05/13/2017   CO2 22 05/13/2017  ASSESSMENT/PLAN    Problem List Items Addressed This Visit    Chronic kidney disease (CKD), stage IV (severe) ([place]) (Chronic)    Followed by nephrology.  Holding off on ARB or ACE inhibitor as a result of renal insufficiency.  For better  blood pressure control may want to consider a calcium channel blocker.      Coronary artery disease involving native coronary artery of native heart with angina pectoris ([place]) - Primary (Chronic)    She did have chest tightness or pressure, but is noticing exertional dyspnea.  She has an occluded vein graft to the right, and proximal LAD disease as well.  Has done well on medications for now.  We will increase Imdur to 60 mg daily because of high blood pressures.  We will also continue on metoprolol aspirin and atorvastatin.  If progressive exertional dyspnea continues, may need to reevaluate Myoview.      Relevant Medications   isosorbide mononitrate (IMDUR) 60 MG 24 hr tablet   Other Relevant Orders   EKG 12-Lead   Lipid panel   Comprehensive metabolic panel   Essential hypertension (Chronic)    Blood pressure is high today on current meds.  Will increase Imdur to 60 mg daily.  May need to consider calcium channel blocker such as amlodipine or nifedipine XL.[name]      Relevant Medications   isosorbide mononitrate (IMDUR) 60 MG 24 hr tablet   Hyperlipidemia LDL goal <70 (Chronic)    On stable dose of atorvastatin.  Doing better on atorvastatin than on simvastatin. Has not had labs checked in a while.  Plan: Continue statin and check CMP and lipid panel      Relevant Medications   isosorbide mononitrate (IMDUR) 60 MG 24 hr tablet   Other Relevant Orders   Lipid panel   Comprehensive metabolic panel   CAD (coronary artery disease) of artery bypass graft (Chronic)    Known occlusion of the RCA graft.  With patent vein graft to the OM and LIMA to LAD.  No recurrent angina.  Stable. Last heart cath was in 2016.  With her having some exertional dyspnea we may need to consider ischemic evaluation, but would like to wait until Covid 19 issues calm down.  Plan for now will be to add Imdur and continue current medications.  May need more blood pressure control.      Relevant  Medications   isosorbide mononitrate (IMDUR) 60 MG 24 hr tablet   Other Relevant Orders   EKG 12-Lead   Lipid panel   Comprehensive metabolic panel      [id number] Education: The signs and symptoms of COVID-19 were discussed with the patient and how to seek care for testing (follow up with PCP or arrange E-visit).   The importance of social distancing was discussed today.  I spent a total of 54mnutes with the patient and chart review. >  50% of the time was spent in direct patient consultation.  Additional time spent with chart review (studies, outside notes, etc): 6 Total Time: 26 min   Current medicines are reviewed at length with the patient today.  (+/- concerns) none   Patient Instructions / Medication Changes & Studies & Tests Ordered   Patient Instructions  Medication Instructions:  INCREASE IMDUR ( ISOSORBIDE MONO ) 60 MG DAILY   *If you need a refill on your cardiac medications before your next appointment, please call your pharmacy*  Lab Work: *lipid , CMP - FASTING If you have labs (blood  work) drawn today and your tests are completely normal, you will receive your results only by: [name] MyChart Message (if you have MyChart) OR . A paper copy in the mail If you have any lab test that is abnormal or we need to change your treatment, we will call you to review the results.  Testing/Procedures: Not needed  Follow-Up: At [facility], you and your health needs are our priority.  As part of our continuing mission to provide you with exceptional heart care, we have created designated Provider Care Teams.  These Care Teams include your primary Cardiologist (physician) and Advanced Practice Providers (APPs -  Physician Assistants and Nurse Practitioners) who all work together to provide you with the care you need, when you need it.  Your next appointment:   9 TO 10  month(s) OCT/NOV 2021  The format for your next appointment:   In Person  Provider:   Glenetta Hew, MD     Studies Ordered:   Orders Placed This Encounter  Procedures  . Lipid panel  . Comprehensive metabolic panel  . EKG 12-Lead     Glenetta Hew, M.D., M.S. Interventional Cardiologist   Pager # [phone] Phone # [phone] [address] [place], [place] [address]   Thank you for choosing Heartcare at [facility]!!

## 2019-01-19 NOTE — Patient Instructions (Addendum)
Medication Instructions:  INCREASE IMDUR ( ISOSORBIDE MONO ) 60 MG DAILY   *If you need a refill on your cardiac medications before your next appointment, please call your pharmacy*  Lab Work: *lipid , CMP - FASTING If you have labs (blood work) drawn today and your tests are completely normal, you will receive your results only by: [name] MyChart Message (if you have MyChart) OR . A paper copy in the mail If you have any lab test that is abnormal or we need to change your treatment, we will call you to review the results.  Testing/Procedures: Not needed  Follow-Up: At [facility], you and your health needs are our priority.  As part of our continuing mission to provide you with exceptional heart care, we have created designated Provider Care Teams.  These Care Teams include your primary Cardiologist (physician) and Advanced Practice Providers (APPs -  Physician Assistants and Nurse Practitioners) who all work together to provide you with the care you need, when you need it.  Your next appointment:   9 TO 10  month(s) OCT/NOV 2021  The format for your next appointment:   In Person  Provider:   Glenetta Hew, MD

## 2019-01-21 ENCOUNTER — Encounter: Payer: Self-pay | Admitting: Cardiology

## 2019-01-21 NOTE — Assessment & Plan Note (Signed)
On stable dose of atorvastatin.  Doing better on atorvastatin than on simvastatin. Has not had labs checked in a while.  Plan: Continue statin and check CMP and lipid panel

## 2019-01-21 NOTE — Assessment & Plan Note (Signed)
Known occlusion of the RCA graft.  With patent vein graft to the OM and LIMA to LAD.  No recurrent angina.  Stable. Last heart cath was in 2016.  With her having some exertional dyspnea we may need to consider ischemic evaluation, but would like to wait until Covid 19 issues calm down.  Plan for now will be to add Imdur and continue current medications.  May need more blood pressure control.

## 2019-01-21 NOTE — Assessment & Plan Note (Signed)
Blood pressure is high today on current meds.  Will increase Imdur to 60 mg daily.  May need to consider calcium channel blocker such as amlodipine or nifedipine XL.[name]

## 2019-01-21 NOTE — Assessment & Plan Note (Signed)
Followed by nephrology.  Holding off on ARB or ACE inhibitor as a result of renal insufficiency.  For better blood pressure control may want to consider a calcium channel blocker.

## 2019-01-21 NOTE — Assessment & Plan Note (Signed)
She did have chest tightness or pressure, but is noticing exertional dyspnea.  She has an occluded vein graft to the right, and proximal LAD disease as well.  Has done well on medications for now.  We will increase Imdur to 60 mg daily because of high blood pressures.  We will also continue on metoprolol aspirin and atorvastatin.  If progressive exertional dyspnea continues, may need to reevaluate Myoview.

## 2019-01-26 ENCOUNTER — Other Ambulatory Visit: Payer: Self-pay | Admitting: Cardiology

## 2019-01-29 ENCOUNTER — Other Ambulatory Visit: Payer: Self-pay | Admitting: Cardiology

## 2019-02-02 ENCOUNTER — Ambulatory Visit: Payer: Medicare PPO | Attending: Internal Medicine

## 2019-02-02 DIAGNOSIS — Z23 Encounter for immunization: Secondary | ICD-10-CM

## 2019-02-02 NOTE — Progress Notes (Signed)
   Covid-19 Vaccination Clinic  Name:  Chrisanna Mishra    MRN: [id number] DOB: [birth year]  02/02/2019  Ms. Fort was observed post Covid-19 immunization for 15 minutes without incidence. She was provided with Vaccine Information Sheet and instruction to access the V-Safe system.   Ms. Westergren was instructed to call 911 with any severe reactions post vaccine: [name] Difficulty breathing  . Swelling of your face and throat  . A fast heartbeat  . A bad rash all over your body  . Dizziness and weakness    Immunizations Administered    Name Date Dose VIS Date Route   Pfizer COVID-19 Vaccine 02/02/2019  6:49 PM 0.3 mL 12/22/2018 Intramuscular   Manufacturer: [place]   Lot: [id number]   [place]: [id number]

## 2019-02-23 ENCOUNTER — Other Ambulatory Visit: Payer: Self-pay

## 2019-02-23 ENCOUNTER — Ambulatory Visit: Payer: Medicare PPO | Attending: Internal Medicine

## 2019-02-23 DIAGNOSIS — Z23 Encounter for immunization: Secondary | ICD-10-CM

## 2019-02-23 NOTE — Progress Notes (Signed)
   Covid-19 Vaccination Clinic  Name:  Dana Jackson    MRN: [id number] DOB: November 24, 1945  02/23/2019  Dana Jackson was observed post Covid-19 immunization for 15 minutes without incidence. She was provided with Vaccine Information Sheet and instruction to access the V-Safe system.   Dana Jackson was instructed to call 911 with any severe reactions post vaccine: [name] Difficulty breathing  . Swelling of your face and throat  . A fast heartbeat  . A bad rash all over your body  . Dizziness and weakness    Immunizations Administered    Name Date Dose VIS Date Route   Pfizer COVID-19 Vaccine 02/23/2019  4:34 PM 0.3 mL 12/22/2018 Intramuscular   Manufacturer: [place]   Lot: [id number]   [place]: [id number]

## 2019-02-27 ENCOUNTER — Other Ambulatory Visit: Payer: Self-pay | Admitting: Cardiology

## 2019-03-27 ENCOUNTER — Inpatient Hospital Stay (HOSPITAL_COMMUNITY)
Admission: EM | Admit: 2019-03-27 | Discharge: 2019-03-30 | DRG: 683 | Disposition: A | Discharge status: Home / Self Care | Payer: Medicare PPO | Attending: Internal Medicine | Admitting: Internal Medicine

## 2019-03-27 ENCOUNTER — Other Ambulatory Visit: Payer: Self-pay

## 2019-03-27 ENCOUNTER — Inpatient Hospital Stay (HOSPITAL_COMMUNITY): Payer: Medicare PPO

## 2019-03-27 ENCOUNTER — Emergency Department (HOSPITAL_COMMUNITY): Payer: Medicare PPO

## 2019-03-27 DIAGNOSIS — Z79891 Long term (current) use of opiate analgesic: Secondary | ICD-10-CM | POA: Diagnosis not present

## 2019-03-27 DIAGNOSIS — G9340 Encephalopathy, unspecified: Secondary | ICD-10-CM | POA: Diagnosis not present

## 2019-03-27 DIAGNOSIS — Z20822 Contact with and (suspected) exposure to covid-19: Secondary | ICD-10-CM | POA: Diagnosis present

## 2019-03-27 DIAGNOSIS — I25810 Atherosclerosis of coronary artery bypass graft(s) without angina pectoris: Secondary | ICD-10-CM | POA: Diagnosis present

## 2019-03-27 DIAGNOSIS — N179 Acute kidney failure, unspecified: Secondary | ICD-10-CM | POA: Diagnosis not present

## 2019-03-27 DIAGNOSIS — N185 Chronic kidney disease, stage 5: Secondary | ICD-10-CM

## 2019-03-27 DIAGNOSIS — I25708 Atherosclerosis of coronary artery bypass graft(s), unspecified, with other forms of angina pectoris: Secondary | ICD-10-CM | POA: Diagnosis not present

## 2019-03-27 DIAGNOSIS — H9190 Unspecified hearing loss, unspecified ear: Secondary | ICD-10-CM | POA: Diagnosis present

## 2019-03-27 DIAGNOSIS — Z905 Acquired absence of kidney: Secondary | ICD-10-CM

## 2019-03-27 DIAGNOSIS — R509 Fever, unspecified: Secondary | ICD-10-CM | POA: Diagnosis not present

## 2019-03-27 DIAGNOSIS — I10 Essential (primary) hypertension: Secondary | ICD-10-CM | POA: Diagnosis not present

## 2019-03-27 DIAGNOSIS — Z90710 Acquired absence of both cervix and uterus: Secondary | ICD-10-CM

## 2019-03-27 DIAGNOSIS — Z8249 Family history of ischemic heart disease and other diseases of the circulatory system: Secondary | ICD-10-CM | POA: Diagnosis not present

## 2019-03-27 DIAGNOSIS — Z7982 Long term (current) use of aspirin: Secondary | ICD-10-CM

## 2019-03-27 DIAGNOSIS — I361 Nonrheumatic tricuspid (valve) insufficiency: Secondary | ICD-10-CM | POA: Diagnosis not present

## 2019-03-27 DIAGNOSIS — Z9049 Acquired absence of other specified parts of digestive tract: Secondary | ICD-10-CM | POA: Diagnosis not present

## 2019-03-27 DIAGNOSIS — Z79899 Other long term (current) drug therapy: Secondary | ICD-10-CM

## 2019-03-27 DIAGNOSIS — E1122 Type 2 diabetes mellitus with diabetic chronic kidney disease: Secondary | ICD-10-CM

## 2019-03-27 DIAGNOSIS — Z8673 Personal history of transient ischemic attack (TIA), and cerebral infarction without residual deficits: Secondary | ICD-10-CM | POA: Diagnosis not present

## 2019-03-27 DIAGNOSIS — E1136 Type 2 diabetes mellitus with diabetic cataract: Secondary | ICD-10-CM | POA: Diagnosis present

## 2019-03-27 DIAGNOSIS — E785 Hyperlipidemia, unspecified: Secondary | ICD-10-CM | POA: Diagnosis present

## 2019-03-27 DIAGNOSIS — Z823 Family history of stroke: Secondary | ICD-10-CM

## 2019-03-27 DIAGNOSIS — C541 Malignant neoplasm of endometrium: Secondary | ICD-10-CM | POA: Diagnosis not present

## 2019-03-27 DIAGNOSIS — R319 Hematuria, unspecified: Secondary | ICD-10-CM

## 2019-03-27 DIAGNOSIS — A419 Sepsis, unspecified organism: Secondary | ICD-10-CM

## 2019-03-27 DIAGNOSIS — K5900 Constipation, unspecified: Secondary | ICD-10-CM | POA: Diagnosis not present

## 2019-03-27 DIAGNOSIS — E1129 Type 2 diabetes mellitus with other diabetic kidney complication: Secondary | ICD-10-CM | POA: Diagnosis present

## 2019-03-27 DIAGNOSIS — I120 Hypertensive chronic kidney disease with stage 5 chronic kidney disease or end stage renal disease: Secondary | ICD-10-CM | POA: Diagnosis not present

## 2019-03-27 DIAGNOSIS — Z807 Family history of other malignant neoplasms of lymphoid, hematopoietic and related tissues: Secondary | ICD-10-CM | POA: Diagnosis not present

## 2019-03-27 LAB — CBC WITH DIFFERENTIAL/PLATELET
Abs Immature Granulocytes: 0.03 10*3/uL (ref 0.00–0.07)
Basophils Absolute: 0 10*3/uL (ref 0.0–0.1)
Basophils Relative: 0 %
Eosinophils Absolute: 0 10*3/uL (ref 0.0–0.5)
Eosinophils Relative: 0 %
HCT: 35.3 % — ABNORMAL LOW (ref 36.0–46.0)
Hemoglobin: 11.4 g/dL — ABNORMAL LOW (ref 12.0–15.0)
Immature Granulocytes: 0 %
Lymphocytes Relative: 6 %
Lymphs Abs: 0.5 10*3/uL — ABNORMAL LOW (ref 0.7–4.0)
MCH: 30.2 pg (ref 26.0–34.0)
MCHC: 32.3 g/dL (ref 30.0–36.0)
MCV: 93.4 fL (ref 80.0–100.0)
Monocytes Absolute: 0.5 10*3/uL (ref 0.1–1.0)
Monocytes Relative: 7 %
Neutro Abs: 6.5 10*3/uL (ref 1.7–7.7)
Neutrophils Relative %: 87 %
Platelets: 160 10*3/uL (ref 150–400)
RBC: 3.78 MIL/uL — ABNORMAL LOW (ref 3.87–5.11)
RDW: 12.7 % (ref 11.5–15.5)
WBC: 7.6 10*3/uL (ref 4.0–10.5)
nRBC: 0 % (ref 0.0–0.2)

## 2019-03-27 LAB — COMPREHENSIVE METABOLIC PANEL
ALT: 30 U/L (ref 0–44)
AST: 25 U/L (ref 15–41)
Albumin: 3.2 g/dL — ABNORMAL LOW (ref 3.5–5.0)
Alkaline Phosphatase: 89 U/L (ref 38–126)
Anion gap: 9 (ref 5–15)
BUN: 46 mg/dL — ABNORMAL HIGH (ref 8–23)
CO2: 24 mmol/L (ref 22–32)
Calcium: 9.6 mg/dL (ref 8.9–10.3)
Chloride: 103 mmol/L (ref 98–111)
Creatinine, Ser: 4.14 mg/dL — ABNORMAL HIGH (ref 0.44–1.00)
GFR calc Af Amer: 12 mL/min — ABNORMAL LOW (ref >60)
GFR calc non Af Amer: 10 mL/min — ABNORMAL LOW (ref >60)
Glucose, Bld: 116 mg/dL — ABNORMAL HIGH (ref 70–99)
Potassium: 4.7 mmol/L (ref 3.5–5.1)
Sodium: 136 mmol/L (ref 135–145)
Total Bilirubin: 1.1 mg/dL (ref 0.3–1.2)
Total Protein: 6.2 g/dL — ABNORMAL LOW (ref 6.5–8.1)

## 2019-03-27 LAB — URINALYSIS, COMPLETE (UACMP) WITH MICROSCOPIC
Bilirubin Urine: NEGATIVE
Glucose, UA: NEGATIVE mg/dL
Ketones, ur: NEGATIVE mg/dL
Nitrite: NEGATIVE
Protein, ur: 100 mg/dL — AB
RBC / HPF: >50 RBC/hpf — ABNORMAL HIGH (ref 0–5)
Specific Gravity, Urine: 1.012 (ref 1.005–1.030)
pH: 7 (ref 5.0–8.0)

## 2019-03-27 LAB — LACTIC ACID, PLASMA: Lactic Acid, Venous: 1.4 mmol/L (ref 0.5–1.9)

## 2019-03-27 LAB — HEMOGLOBIN A1C
Hgb A1c MFr Bld: 5.7 % — ABNORMAL HIGH (ref 4.8–5.6)
Mean Plasma Glucose: 116.89 mg/dL

## 2019-03-27 LAB — MRSA PCR SCREENING: MRSA by PCR: NEGATIVE

## 2019-03-27 LAB — APTT: aPTT: 37 seconds — ABNORMAL HIGH (ref 24–36)

## 2019-03-27 LAB — PROTIME-INR
INR: 1 (ref 0.8–1.2)
Prothrombin Time: 13.4 seconds (ref 11.4–15.2)

## 2019-03-27 LAB — CBG MONITORING, ED: Glucose-Capillary: 95 mg/dL (ref 70–99)

## 2019-03-27 MED ORDER — FUROSEMIDE 20 MG PO TABS
20.0000 mg | ORAL_TABLET | Freq: Every day | ORAL | Status: DC
  Administered 2019-03-28: 20 mg via ORAL
  Filled 2019-03-27: qty 1

## 2019-03-27 MED ORDER — ACETAMINOPHEN 650 MG RE SUPP
650.0000 mg | Freq: Four times a day (QID) | RECTAL | Status: DC | PRN
  Administered 2019-03-27: 650 mg via RECTAL
  Filled 2019-03-27: qty 1

## 2019-03-27 MED ORDER — ALBUTEROL SULFATE (2.5 MG/3ML) 0.083% IN NEBU: 3.0000 mL | INHALATION_SOLUTION | Freq: Four times a day (QID) | RESPIRATORY_TRACT | Status: DC | PRN

## 2019-03-27 MED ORDER — METOPROLOL TARTRATE 50 MG PO TABS
50.0000 mg | ORAL_TABLET | Freq: Two times a day (BID) | ORAL | Status: DC
  Administered 2019-03-28 – 2019-03-30 (×5): 50 mg via ORAL
  Filled 2019-03-27: qty 2
  Filled 2019-03-27 (×4): qty 1

## 2019-03-27 MED ORDER — SODIUM CHLORIDE 0.9 % IV SOLN
2.0000 g | INTRAVENOUS | Status: DC
  Administered 2019-03-27 – 2019-03-29 (×3): 2 g via INTRAVENOUS
  Filled 2019-03-27: qty 20
  Filled 2019-03-27 (×2): qty 2
  Filled 2019-03-27: qty 20

## 2019-03-27 MED ORDER — INSULIN ASPART 100 UNIT/ML ~~LOC~~ SOLN
0.0000 [IU] | Freq: Three times a day (TID) | SUBCUTANEOUS | Status: DC
  Administered 2019-03-28 – 2019-03-29 (×2): 1 [IU] via SUBCUTANEOUS

## 2019-03-27 MED ORDER — LABETALOL HCL 5 MG/ML IV SOLN
INTRAVENOUS | Status: AC
  Filled 2019-03-27: qty 4

## 2019-03-27 MED ORDER — ONDANSETRON HCL 4 MG PO TABS: 4.0000 mg | ORAL_TABLET | Freq: Four times a day (QID) | ORAL | Status: DC | PRN

## 2019-03-27 MED ORDER — GABAPENTIN 100 MG PO CAPS
100.0000 mg | ORAL_CAPSULE | Freq: Three times a day (TID) | ORAL | Status: DC
  Administered 2019-03-28 – 2019-03-30 (×8): 100 mg via ORAL
  Filled 2019-03-27 (×8): qty 1

## 2019-03-27 MED ORDER — ATORVASTATIN CALCIUM 40 MG PO TABS
40.0000 mg | ORAL_TABLET | Freq: Every day | ORAL | Status: DC
  Administered 2019-03-28 – 2019-03-29 (×2): 40 mg via ORAL
  Filled 2019-03-27 (×2): qty 1

## 2019-03-27 MED ORDER — HYDROCODONE-HOMATROPINE 5-1.5 MG/5ML PO SYRP: 5.0000 mL | ORAL_SOLUTION | Freq: Four times a day (QID) | ORAL | Status: DC | PRN

## 2019-03-27 MED ORDER — ASPIRIN 81 MG PO CHEW
81.0000 mg | CHEWABLE_TABLET | Freq: Every day | ORAL | Status: DC
  Administered 2019-03-28 – 2019-03-29 (×2): 81 mg via ORAL
  Filled 2019-03-27 (×2): qty 1

## 2019-03-27 MED ORDER — LABETALOL HCL 5 MG/ML IV SOLN
10.0000 mg | Freq: Once | INTRAVENOUS | Status: AC
  Administered 2019-03-27: 10 mg via INTRAVENOUS

## 2019-03-27 MED ORDER — ONDANSETRON HCL 4 MG/2ML IJ SOLN
4.0000 mg | Freq: Four times a day (QID) | INTRAMUSCULAR | Status: DC | PRN
  Administered 2019-03-27: 4 mg via INTRAVENOUS
  Filled 2019-03-27: qty 2

## 2019-03-27 MED ORDER — PANTOPRAZOLE SODIUM 40 MG PO TBEC
80.0000 mg | DELAYED_RELEASE_TABLET | Freq: Every day | ORAL | Status: DC
  Administered 2019-03-28 – 2019-03-30 (×3): 80 mg via ORAL
  Filled 2019-03-27 (×3): qty 2

## 2019-03-27 MED ORDER — ACETAMINOPHEN 500 MG PO TABS
500.0000 mg | ORAL_TABLET | Freq: Once | ORAL | Status: AC
  Administered 2019-03-27: 500 mg via ORAL
  Filled 2019-03-27: qty 1

## 2019-03-27 MED ORDER — ENOXAPARIN SODIUM 30 MG/0.3ML ~~LOC~~ SOLN
30.0000 mg | SUBCUTANEOUS | Status: DC
  Administered 2019-03-27 – 2019-03-29 (×3): 30 mg via SUBCUTANEOUS
  Filled 2019-03-27 (×3): qty 0.3

## 2019-03-27 MED ORDER — ISOSORBIDE MONONITRATE ER 60 MG PO TB24
60.0000 mg | ORAL_TABLET | Freq: Every day | ORAL | Status: DC
  Administered 2019-03-28 – 2019-03-30 (×3): 60 mg via ORAL
  Filled 2019-03-27: qty 2
  Filled 2019-03-27 (×2): qty 1

## 2019-03-27 MED ORDER — LACTATED RINGERS IV BOLUS
500.0000 mL | Freq: Once | INTRAVENOUS | Status: AC
  Administered 2019-03-27: 500 mL via INTRAVENOUS

## 2019-03-27 MED ORDER — SODIUM CHLORIDE 0.9 % IV SOLN
100.0000 mg | Freq: Two times a day (BID) | INTRAVENOUS | Status: DC
  Administered 2019-03-28: 100 mg via INTRAVENOUS
  Filled 2019-03-27 (×4): qty 100

## 2019-03-27 MED ORDER — ACETAMINOPHEN 325 MG PO TABS: 650.0000 mg | ORAL_TABLET | Freq: Four times a day (QID) | ORAL | Status: DC | PRN

## 2019-03-27 MED ORDER — DOXYCYCLINE HYCLATE 100 MG PO TABS
100.0000 mg | ORAL_TABLET | Freq: Two times a day (BID) | ORAL | Status: DC
  Filled 2019-03-27: qty 1

## 2019-03-27 NOTE — ED Notes (Signed)
Patient currently not answering questions, not verbalizing, not following commands.  Making purposeful movements.  Dr. Alcario Drought at bedside at this time.  Notified of fever and administration of Tylenol.

## 2019-03-27 NOTE — Progress Notes (Signed)
Pt with worsened AMS, running fever of 102.5 now.  Tachy to 110s.  BP is HYPERtensive 201/79 (repeated multiple times and confirmed manually).  Also had 1 episode of greenish emesis.  BGL 95.  1) given tylenol PR 2) CT head (though I think AMS just due to fever) 3) given labetalol 10mg  IV x1 4) switching doxycycline to PO 5) not giving sepsis fluid bolus at this time as her BP is very high already. 6) bed request changed to SDU.

## 2019-03-27 NOTE — ED Triage Notes (Signed)
Pt arrives BIB [facility] from home, complaints of altered mental status, LSW 2100 last night, woke up more confused this morning. Ordinarily AOx4, currently AOx2 disoriented to place and situation.  New, healed dialysis graft on L arm, has not started dialysis yet. VSS for [facility]: 190/100; CBG 159, 99%  Husband [name] [phone] home; [phone] cell

## 2019-03-27 NOTE — ED Provider Notes (Signed)
[facility] EMERGENCY DEPARTMENT Provider Note   CSN: [id number] Arrival date & time: 03/27/19  1647     History Chief Complaint  Patient presents with  . Altered Mental Status   LEVEL FIVE CAVEAT DUE TO AMS  Dana Jackson is a 74 y.o. female. HPI Comments: Dana Jackson is a 74 y.o. female with a history of renal cell carcinoma who presents to the Emergency Department via EMS due to [place]. Per nursing, pt was last seen normal by her husband at 9:00 PM last night. She woke today altered and has been altered all day today. She is unable to answer questions and is oriented to only herself during the exam.  Per records, she has received the COVID-19 vaccine.  I was able to reach out and discuss the patient with her husband.  He states that she was experiencing chills this morning and she was "having difficulty writing a check" noting that "she was putting the wrong information in the wrong areas of the check".  He also notes she was exhibiting "jerking movements" during this period as well. He denies a history of similar episodes.  She had a dental cleaning yesterday but denies any dental procedures were performed.  HPI     Past Medical History:  Diagnosis Date  . Anemia   . Arthritis    hands  . Asthma    childhood  . Bacteremia due to Escherichia coli June 2015    Currently being treated with anti-biotics  . CAD in native artery 12/2010   a) Cath for exertional angina & EKG changes: 40% LM, 80% mid LAD.  95% ost cX, 80-90% PDA --> CABG X3; b) Post CABG CATH for + Myoview with basal anterior ischemia -> Ost LAD 80% (diffuse) then 100% after SP2, RI 70% (too small for PCI), Ost-prox Cx 99% & OM1 90%, OstrPDA 80% (small); Occluded SVG-rPDA.  Patent LIMA-dLAD, SVG-OM: culprit ~ p-m LAD pre-LIMA & RI - not good PCI target --> Med Rx  . CKD (chronic kidney disease) stage 3, GFR 30-59 ml/min   . Degenerative disc disease, cervical    Degenerative disc disease, cervical  [722.4]  . Diabetes mellitus without complication ([place])    Type II  . Endometrial cancer [facility]) November 2015   Treated with TAH with pelvic lymphadenectomy followed by radiation and chemotherapy  . GERD (gastroesophageal reflux disease)   . Hearing loss    bilateral - no hearing aids  . Heart murmur   . History of asthma     childhood  . History of blood transfusion   . History of pneumonia    "2-3 times"  . History of unstable angina November/December 2012   T wave inversions in inferolateral leads.  No stress test performed.  . Hyperlipidemia LDL goal <70   . Hypertension, essential, benign   . Neuropathy    hands  . Pneumonia 2018  . Renal cell carcinoma ([place]) 11/18/2008   T2aNx s/p partial left nephrectomy  . 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 Stroke [facility])    TIA- no residual   . TIA (transient ischemic attack) 1992 &  2010    Patient Active Problem List   Diagnosis Date Noted  . Coronary artery disease involving native coronary artery of native heart with angina pectoris ([place]) 09/08/2014  . Acute-on-chronic kidney injury ([place]) 07/29/2014  . Hyperkalemia 07/29/2014  . CAD (coronary artery disease) of artery bypass graft 07/29/2014  .  Type 2 diabetes mellitus with renal manifestations, controlled ([place]) 07/10/2014  . Endometrial cancer ([place]) 07/10/2014  . Obesity (BMI 30-39.9) 09/03/2012  . Hyperlipidemia LDL goal <70   . Anemia 12/28/2010  . S/P CABG x 3 12/25/2010  . Renal cell carcinoma ([place]) 12/23/2010    Class: History of  . Chronic kidney disease (CKD), stage IV (severe) ([place]) 12/23/2010    Class: Diagnosis of  . Essential hypertension 12/23/2010    Class: History of    Past Surgical History:  Procedure Laterality Date  . ANTERIOR CERVICAL DECOMP/DISCECTOMY FUSION  10/11/2005   multi-level  . AV FISTULA PLACEMENT Left 08/03/2017   Procedure: ARTERIOVENOUS (AV) FISTULA CREATION LEFT ARM;  Surgeon: Rosetta Posner, MD;  Location:  [place];  Service: Vascular;  Laterality: Left;  . CARDIAC CATHETERIZATION  12/24/10   40% left main, 80% mid LAD, 95% ostial circumflex, 80-90% PDA.  [name] CARDIAC CATHETERIZATION N/A 07/01/2014   Procedure: Left Heart Cath and Cors/Grafts Angiography;  Surgeon: Leonie Man, MD;  Location: [facility] INVASIVE CV LAB:  For Abn Nuc @ [facility]: Ost LAD 80%, mid LAD 100% after S/P 2, 70% RI (no PCI target), ost-prox Cx 99%, OM1 90%. Ost rPDA 80% (~ pre-CABG), occluded SVG-rPDA, patent LIMA-dLAD, SVG OM; potential culprit for abn Nuc scan = pLAD Dz, small RI 70% or PDA -> med Rx  . CAROTID ENDARTERECTOMY Left   . CHOLECYSTECTOMY  ~ 1971  . COLONOSCOPY    . CORONARY ARTERY BYPASS GRAFT  12/25/2010   Procedure: CORONARY ARTERY BYPASS GRAFTING (CABGX3 - LIMA-LAD, SVG RPL, SVG-Circumflex);  Surgeon: Rexene Alberts, MD;  Location: [place];  Service: Open Heart Surgery;  Laterality: N/A;  . DILATATION & CURETTAGE/HYSTEROSCOPY WITH MYOSURE N/A 11/02/2013   Procedure: DILATATION & CURETTAGE/HYSTEROSCOPY WITH MYOSURE ABLATION;  Surgeon: Allena Katz, MD;  Location: [facility];  Service: Gynecology;  Laterality: N/A;  . DOPPLER ECHOCARDIOGRAPHY  12/08/2010   EF =>55%,MILD CONCENTRIC LEFT VENTRICULAR HYPERTROPHY  . EYE SURGERY Bilateral    Cataract  . LEFT HEART CATHETERIZATION WITH CORONARY ANGIOGRAM N/A 12/24/2010   Procedure: LEFT HEART CATHETERIZATION WITH CORONARY ANGIOGRAM;  Surgeon: Leonie Man, MD;  Location: [facility] CATH LAB;  Service: Cardiovascular;  Laterality: N/A;  . NM MYOCAR SINGLE W/SPECT  07/26/2007   EF 79%, LEFT VENT.FUNCTION NORMAL  . PARTIAL NEPHRECTOMY Left 11/18/2008   left partial nephrectomy for renal cell CA  . PET Myocardial Perfusion Scan  06/13/2014   At [place]: Moderate size, mild severity completely reversible defect involving the basal anterior, mid anterior and apical anterior segments consistent with ischemia. EF 65% with normal global function.  . TONSILLECTOMY     "in college"   . TOTAL ABDOMINAL HYSTERECTOMY  November 2015    At [facility]: Robotic procedure with pelvic lymphadenectomy  . TRANSTHORACIC ECHOCARDIOGRAM  06/13/2014   At [place]: EF 60-65%. Grade 1 diastolic dysfunction. Mild MR. Aortic sclerosis. Moderate pulmonary hypertension  . TUBAL LIGATION  ~ 1984  . UPPER GI ENDOSCOPY    . WISDOM TOOTH EXTRACTION       OB History   No obstetric history on file.     Family History  Problem Relation Age of Onset  . AAA (abdominal aortic aneurysm) Mother   . Multiple myeloma Mother   . GER disease Mother   . Coronary artery disease Father   . Heart disease Father   . Coronary artery disease Sister   . Coronary artery disease Brother   .  Heart disease Brother   . Stroke Maternal Grandmother   . Heart attack Neg Hx     Social History   Tobacco Use  . Smoking status: Never Smoker  . Smokeless tobacco: Never Used  Substance Use Topics  . Alcohol use: Not Currently    Alcohol/week: 0.0 standard drinks  . Drug use: No    Home Medications Prior to Admission medications   Medication Sig Start Date End Date Taking? Authorizing Provider  Alpha-Lipoic Acid 600 MG CAPS Take 600 mg by mouth daily.    [provider]  aspirin 81 MG tablet Take 81 mg by mouth every evening.     [provider]  atorvastatin (LIPITOR) 40 MG tablet Take 40 mg by mouth at bedtime.     [provider]  furosemide (LASIX) 20 MG tablet Take 20 mg by mouth every evening.  03/07/17   [provider]  gabapentin (NEURONTIN) 100 MG capsule Take 100-200 mg by mouth 2 (two) times daily. Take '100mg'$  by mouth in the morning and '200mg'$  at bedtime    [provider]  glipiZIDE (GLUCOTROL) 5 MG tablet Take 2.5 mg by mouth 2 (two) times daily before a meal.     [provider]  HYDROcodone-homatropine (HYCODAN) 5-1.5 MG/5ML syrup Take 5 mLs by mouth every 6 (six) hours as needed for cough.  05/06/17   [provider]   isosorbide mononitrate (IMDUR) 60 MG 24 hr tablet Take 1 tablet (60 mg total) by mouth daily. 01/19/19 04/19/19  Leonie Man, MD  isosorbide mononitrate (IMDUR) 60 MG 24 hr tablet Take 1 tablet (60 mg total) by mouth daily. 01/26/19 04/26/19  Leonie Man, MD  loperamide (IMODIUM) 2 MG capsule Take 2 mg by mouth 4 (four) times daily as needed for diarrhea or loose stools.    [provider]  loratadine (CLARITIN) 10 MG tablet Take 10 mg by mouth daily.    [provider]  magnesium oxide (MAG-OX) 400 MG tablet Take 400 mg by mouth daily.    [provider]  metoprolol tartrate (LOPRESSOR) 50 MG tablet Take 1 tablet (50 mg total) by mouth 2 (two) times daily. 02/27/19   Leonie Man, MD  nitroGLYCERIN (NITROSTAT) 0.4 MG SL tablet PLACE 1 TABLET (0.4 MG TOTAL) UNDER THE TONGUE EVERY 5 (FIVE) MINUTES AS NEEDED FOR CHEST PAIN. 01/30/19   Leonie Man, MD  omeprazole (PRILOSEC) 40 MG capsule Take 40 mg by mouth daily.      [provider]  oxyCODONE-acetaminophen (PERCOCET) 5-325 MG tablet Take 1 tablet by mouth every 6 (six) hours as needed for severe pain. 08/03/17   Gabriel Earing, PA-C  PROAIR HFA 108 [phone] Base) MCG/ACT inhaler Inhale 2 puffs into the lungs every 6 (six) hours as needed for wheezing or shortness of breath.  05/09/17   [provider]  Probiotic Product (ALIGN PO) Take 1 capsule by mouth daily.     [provider]  promethazine (PHENERGAN) 25 MG tablet Take 25 mg by mouth every 6 (six) hours as needed for nausea or vomiting.     [provider]  pyridOXINE (VITAMIN B-6) 100 MG tablet Take 600 mg by mouth daily.     [provider]    Allergies    Erythromycin, Pravastatin, Simvastatin, Sulfa antibiotics, Codeine, and Gemfibrozil  Review of Systems   Review of Systems  Unable to perform ROS: Mental status change   Physical Exam Updated Vital Signs BP [name])  152/68 (BP Location: Right Arm)   Pulse 96    Temp (!) 102 F (38.9 C) (Axillary)   Resp 16   Ht 5' (1.524 m)   Wt 90.7 kg   SpO2 95%   BMI 39.06 kg/m   Physical Exam Vitals and nursing note reviewed.  Constitutional:      General: She is not in acute distress.    Appearance: Normal appearance. She is normal weight. She is not ill-appearing, toxic-appearing or diaphoretic.  HENT:     Head: Normocephalic and atraumatic.     Nose: Nose normal.     Mouth/Throat:     Mouth: Mucous membranes are dry.     Pharynx: Oropharynx is clear. No oropharyngeal exudate or posterior oropharyngeal erythema.  Eyes:     General: No scleral icterus.       Right eye: No discharge.        Left eye: No discharge.     Pupils: Pupils are equal, round, and reactive to light.  Cardiovascular:     Rate and Rhythm: Normal rate.     Heart sounds: Murmur (Systolic murmur best heard at right upper sternal border) present.     Comments: No peripheral edema Pulmonary:     Effort: No respiratory distress.     Breath sounds: Normal breath sounds. No stridor. No wheezing, rhonchi or rales.     Comments: Poor inspiratory effort Abdominal:     General: There is no distension.     Palpations: Abdomen is soft. There is no mass.     Tenderness: There is no abdominal tenderness. There is no guarding or rebound.     Hernia: No hernia is present.     Comments: No abdominal tenderness appreciated in all 4 quadrants.  No suprapubic tenderness.  Musculoskeletal:     Cervical back: Normal range of motion.     Comments: Recent AV fistula placed in left antecubital fossa.  Good thrill.  No overlying erythema or edema appreciated.  Skin:    General: Skin is warm and dry.     Capillary Refill: Capillary refill takes less than 2 seconds.  Neurological:     Mental Status: She is disoriented.     Comments: Patient has unable to provide clear answers to questions.  She is oriented to self during exam.  Psychiatric:        Attention and Perception: She is inattentive.         Speech: Speech is delayed.        Behavior: Behavior is slowed.     ED Results / Procedures / Treatments   Labs (all labs ordered are listed, but only abnormal results are displayed) Labs Reviewed - No data to display  EKG EKG Interpretation  Date/Time:  Tuesday March 27 2019 16:49:45 EDT Ventricular Rate:  96 PR Interval:    QRS Duration: 99 QT Interval:  333 QTC Calculation: 421 R Axis:   71 Text Interpretation: Sinus rhythm Minimal ST depression, diffuse leads Since last tracing rate slower Confirmed by Noemi Chapel [phone]) on 03/27/2019 4:57:52 PM   Radiology No results found.  Procedures Procedures   Medications Ordered in ED Medications - No data to display  ED Course  I have reviewed the triage vital signs and the nursing notes.  Pertinent labs & imaging results that were available during my care of the patient were reviewed by me and considered in my medical decision making (see chart for details).    MDM Rules/Calculators/A&P  5:10 PM patient is a 74 year old Caucasian female with history of renal cell carcinoma and a recent AV fistula placed in the left antecubital fossa.  She presents due to altered mental status.  She was last seen normal last night at 9 PM.  She presents febrile at 102 Fahrenheit and is borderline tachycardic.  Initiated code sepsis.  CBG 159 en route. Will likely admit.  We will closely monitor.  Patient was discussed with and seen by my attending physician Dr. Noemi Chapel.  5:32 PM discussed patient with her husband.  He was made aware that she will likely be admitted.  He is currently waiting outside and request that we contact him on his cell phone in the future.  6:58 PM CXR shows mild left sided pleural effusion. No leukocytosis. Creatinine 4.14, pt has new AV fistula in place and is starting dialysis soon.  Reevaluated patient and she continues to have no complaints at this time.  She is still obviously  confused and has difficulty clearly answering questions.  We have not found an obvious source of infection at this point. Will work with Dr. Noemi Chapel to discuss with admitting team.   Final Clinical Impression(s) / ED Diagnoses Final diagnoses:  Sepsis, due to unspecified organism, unspecified whether acute organ dysfunction present [facility])    Rx / DC Orders ED Discharge Orders    None       Rayna Sexton, PA-C 03/27/19 2101    Noemi Chapel, MD 03/29/19 [phone]

## 2019-03-27 NOTE — ED Provider Notes (Signed)
This patient is a 74 year old female, prior history of renal cell carcinoma, has now had a fistula placed in her left arm prior to starting dialysis.  She presents from home with a fever and altered mental status.  Level 5 caveat applies secondary to altered mental status.  On my exam the patient has a borderline tachycardia with a heart rate of 97, fever of 102, she is confused and only able to answer occasional questions.  Her abdomen is soft, there is no edema, she has a good fistula in the left arm which has a good thrill but no overlying redness.  Mucous membranes are dry but no signs of erythema exudate or asymmetry of the face, no signs of trauma, conjunctive are clear, no jaundice.  Lungs are also clear without any coughing wheezing rhonchi or rales.  I have personally viewed the patient's prehospital rhythm strip which shows normal sinus rhythm, no signs of ischemia or arrhythmia.  Her EKG performed in the department is also unremarkable showing similar findings.  Vital signs reflect a temperature of 102, blood pressure of 152/68.  Code sepsis will be activated, no definite source on exam, will check for urine, lactate, blood cultures, the patient will need to be admitted.  Patient is critically ill with likely sepsis  .Critical Care Performed by: Noemi Chapel, MD Authorized by: Noemi Chapel, MD   Critical care provider statement:    Critical care time (minutes):  35   Critical care time was exclusive of:  Separately billable procedures and treating other patients and teaching time   Critical care was necessary to treat or prevent imminent or life-threatening deterioration of the following conditions:  Sepsis   Critical care was time spent personally by me on the following activities:  Blood draw for specimens, development of treatment plan with patient or surrogate, discussions with consultants, evaluation of patient's response to treatment, examination of patient, obtaining history from  patient or surrogate, ordering and performing treatments and interventions, ordering and review of laboratory studies, ordering and review of radiographic studies, pulse oximetry, re-evaluation of patient's condition and review of old charts    EKG Interpretation  Date/Time:  Tuesday March 27 2019 16:49:45 EDT Ventricular Rate:  96 PR Interval:    QRS Duration: 99 QT Interval:  333 QTC Calculation: 421 R Axis:   71 Text Interpretation: Sinus rhythm Minimal ST depression, diffuse leads Since last tracing rate slower Confirmed by Noemi Chapel [phone]) on 03/27/2019 4:57:52 PM      Sepsis - Repeat Assessment  Performed at:    7:37 PM   Vitals     Blood pressure (!) 145/57, pulse 92, temperature 99.6 F (37.6 C), temperature source Axillary, resp. rate 17, height 1.524 m (5'), weight 90.7 kg, SpO2 94 %.  Heart:     Regular rate and rhythm  Lungs:    CTA  Capillary Refill:   <2 sec  Peripheral Pulse:   Radial pulse palpable  Skin:     Normal Color  D/w Dr. Alcario Drought - will admit   The recent clinical data is shown below. Vitals:   03/27/19 1815 03/27/19 1830 03/27/19 1845 03/27/19 1907  BP: (!) 149/64 (!) 150/66 (!) 145/57   Pulse: 96 95 92   Resp: 16 (!) 21 17   Temp:    99.6 F (37.6 C)  TempSrc:    Axillary  SpO2: 96% 93% 94%   Weight:      Height:       Medical screening examination/treatment/procedure(s)  were conducted as a shared visit with non-physician practitioner(s) and myself.  I personally evaluated the patient during the encounter.  Clinical Impression:   Final diagnoses:  Sepsis, due to unspecified organism, unspecified whether acute organ dysfunction present [facility])         Noemi Chapel, MD 03/29/19 1544

## 2019-03-27 NOTE — ED Notes (Signed)
Patient arrives to room 9 with new altered mental status.  Previous RN still in room and called Dr. Alcario Drought to notify.

## 2019-03-27 NOTE — ED Notes (Signed)
Please call husband with an update  His name is steve [phone]

## 2019-03-27 NOTE — H&P (Addendum)
History and Physical    Dana Jackson KGY:[id number] DOB: 1945-07-28 DOA: 03/27/2019  PCP: Heywood Bene, PA-C  Patient coming from: Home  I have personally briefly reviewed patient's old medical records in [place]  Chief Complaint: AMS  HPI: Dana Jackson is a 74 y.o. 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  Pt with 4-5 day h/o cough per husband.  Yesterday patient had dental cleaning.  Today patient with confusion, AMS this morning.  LKW 2100 last evening.  Woke up disoriented to place and situation, ordinarily AAOx4.  Had completed COVID vaccines, (1/22 and 2/12).   ED Course: Fever 102.  CXR neg, Satting mid 90s on RA.  Creat 4.1 (baseline).  UA just shows hematuria.   Review of Systems: As per HPI, otherwise all review of systems negative.  Past Medical History:  Diagnosis Date  . Anemia   . Arthritis    hands  . Asthma    childhood  . Bacteremia due to Escherichia coli June 2015    Currently being treated with anti-biotics  . CAD in native artery 12/2010   a) Cath for exertional angina & EKG changes: 40% LM, 80% mid LAD.  95% ost cX, 80-90% PDA --> CABG X3; b) Post CABG CATH for + Myoview with basal anterior ischemia -> Ost LAD 80% (diffuse) then 100% after SP2, RI 70% (too small for PCI), Ost-prox Cx 99% & OM1 90%, OstrPDA 80% (small); Occluded SVG-rPDA.  Patent LIMA-dLAD, SVG-OM: culprit ~ p-m LAD pre-LIMA & RI - not good PCI target --> Med Rx  . CKD (chronic kidney disease) stage 3, GFR 30-59 ml/min   . Degenerative disc disease, cervical    Degenerative disc disease, cervical [722.4]  . Diabetes mellitus without complication ([place])    Type II  . Endometrial cancer [facility]) November 2015   Treated with TAH with pelvic lymphadenectomy followed by radiation and chemotherapy  . GERD (gastroesophageal reflux disease)   . Hearing loss    bilateral - no hearing aids  . Heart murmur     . History of asthma     childhood  . History of blood transfusion   . History of pneumonia    "2-3 times"  . History of unstable angina November/December 2012   T wave inversions in inferolateral leads.  No stress test performed.  . Hyperlipidemia LDL goal <70   . Hypertension, essential, benign   . Neuropathy    hands  . Pneumonia 2018  . Renal cell carcinoma ([place]) 11/18/2008   T2aNx s/p partial left nephrectomy  . 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 Stroke [facility])    TIA- no residual   . TIA (transient ischemic attack) 1992 &  2010    Past Surgical History:  Procedure Laterality Date  . ANTERIOR CERVICAL DECOMP/DISCECTOMY FUSION  10/11/2005   multi-level  . AV FISTULA PLACEMENT Left 08/03/2017   Procedure: ARTERIOVENOUS (AV) FISTULA CREATION LEFT ARM;  Surgeon: Rosetta Posner, MD;  Location: [place];  Service: Vascular;  Laterality: Left;  . CARDIAC CATHETERIZATION  12/24/10   40% left main, 80% mid LAD, 95% ostial circumflex, 80-90% PDA.  [name] CARDIAC CATHETERIZATION N/A 07/01/2014   Procedure: Left Heart Cath and Cors/Grafts Angiography;  Surgeon: Leonie Man, MD;  Location: [facility] INVASIVE CV LAB:  For Abn Nuc @ [facility]: Ost LAD 80%, mid LAD 100% after S/P 2, 70%  RI (no PCI target), ost-prox Cx 99%, OM1 90%. Ost rPDA 80% (~ pre-CABG), occluded SVG-rPDA, patent LIMA-dLAD, SVG OM; potential culprit for abn Nuc scan = pLAD Dz, small RI 70% or PDA -> med Rx  . CAROTID ENDARTERECTOMY Left   . CHOLECYSTECTOMY  ~ 1971  . COLONOSCOPY    . CORONARY ARTERY BYPASS GRAFT  12/25/2010   Procedure: CORONARY ARTERY BYPASS GRAFTING (CABGX3 - LIMA-LAD, SVG RPL, SVG-Circumflex);  Surgeon: Rexene Alberts, MD;  Location: [facility];  Service: Open Heart Surgery;  Laterality: N/A;  . DILATATION & CURETTAGE/HYSTEROSCOPY WITH MYOSURE N/A 11/02/2013   Procedure: DILATATION & CURETTAGE/HYSTEROSCOPY WITH MYOSURE ABLATION;  Surgeon: Allena Katz, MD;  Location: [facility];  Service:  Gynecology;  Laterality: N/A;  . DOPPLER ECHOCARDIOGRAPHY  12/08/2010   EF =>55%,MILD CONCENTRIC LEFT VENTRICULAR HYPERTROPHY  . EYE SURGERY Bilateral    Cataract  . LEFT HEART CATHETERIZATION WITH CORONARY ANGIOGRAM N/A 12/24/2010   Procedure: LEFT HEART CATHETERIZATION WITH CORONARY ANGIOGRAM;  Surgeon: Leonie Man, MD;  Location: [facility] CATH LAB;  Service: Cardiovascular;  Laterality: N/A;  . NM MYOCAR SINGLE W/SPECT  07/26/2007   EF 79%, LEFT VENT.FUNCTION NORMAL  . PARTIAL NEPHRECTOMY Left 11/18/2008   left partial nephrectomy for renal cell CA  . PET Myocardial Perfusion Scan  06/13/2014   At [place]: Moderate size, mild severity completely reversible defect involving the basal anterior, mid anterior and apical anterior segments consistent with ischemia. EF 65% with normal global function.  . TONSILLECTOMY     "in college"  . TOTAL ABDOMINAL HYSTERECTOMY  November 2015    At [facility]: Robotic procedure with pelvic lymphadenectomy  . TRANSTHORACIC ECHOCARDIOGRAM  06/13/2014   At [place]: EF 60-65%. Grade 1 diastolic dysfunction. Mild MR. Aortic sclerosis. Moderate pulmonary hypertension  . TUBAL LIGATION  ~ 1984  . UPPER GI ENDOSCOPY    . WISDOM TOOTH EXTRACTION       reports that she has never smoked. She has never used smokeless tobacco. She reports previous alcohol use. She reports that she does not use drugs.  Allergies  Allergen Reactions  . Erythromycin Itching  . Pravastatin Other (See Comments)    Used 01/2011 to 03/2011 Used 01/2011 to 03/2011 Used 01/2011 to 03/2011  . Simvastatin Other (See Comments)    Used 07/2007 to 11/2010 chg to pravastatin Used 07/2007 to 11/2010 chg to pravastatin Used 07/2007 to 11/2010 chg to pravastatin  . Sulfa Antibiotics     aki   . Codeine Nausea Only and Other (See Comments)    Unknown/ OK to take Hydrocodone cough syrup  Unknown/ OK to take Hydrocodone cough syrup   . Gemfibrozil Itching and Other (See Comments)     unknown unknown    Family History  Problem Relation Age of Onset  . AAA (abdominal aortic aneurysm) Mother   . Multiple myeloma Mother   . GER disease Mother   . Coronary artery disease Father   . Heart disease Father   . Coronary artery disease Sister   . Coronary artery disease Brother   . Heart disease Brother   . Stroke Maternal Grandmother   . Heart attack Neg Hx      Prior to Admission medications   Medication Sig Start Date End Date Taking? Authorizing Provider  Alpha-Lipoic Acid 600 MG CAPS Take 600 mg by mouth daily.    [provider]  aspirin 81 MG tablet Take 81 mg by mouth every evening.  [provider]  atorvastatin (LIPITOR) 40 MG tablet Take 40 mg by mouth at bedtime.     [provider]  furosemide (LASIX) 20 MG tablet Take 20 mg by mouth every evening.  03/07/17   [provider]  gabapentin (NEURONTIN) 100 MG capsule Take 100-200 mg by mouth 2 (two) times daily. Take 174m by mouth in the morning and 2065mat bedtime    [provider]  glipiZIDE (GLUCOTROL) 5 MG tablet Take 2.5 mg by mouth 2 (two) times daily before a meal.     [provider]  HYDROcodone-homatropine (HYCODAN) 5-1.5 MG/5ML syrup Take 5 mLs by mouth every 6 (six) hours as needed for cough.  05/06/17   [provider]  isosorbide mononitrate (IMDUR) 60 MG 24 hr tablet Take 1 tablet (60 mg total) by mouth daily. 01/19/19 04/19/19  HaLeonie ManMD  isosorbide mononitrate (IMDUR) 60 MG 24 hr tablet Take 1 tablet (60 mg total) by mouth daily. 01/26/19 04/26/19  HaLeonie ManMD  loperamide (IMODIUM) 2 MG capsule Take 2 mg by mouth 4 (four) times daily as needed for diarrhea or loose stools.    [provider]  loratadine (CLARITIN) 10 MG tablet Take 10 mg by mouth daily.    [provider]  magnesium oxide (MAG-OX) 400 MG tablet Take 400 mg by mouth daily.    [provider]  metoprolol tartrate  (LOPRESSOR) 50 MG tablet Take 1 tablet (50 mg total) by mouth 2 (two) times daily. 02/27/19   HaLeonie ManMD  nitroGLYCERIN (NITROSTAT) 0.4 MG SL tablet PLACE 1 TABLET (0.4 MG TOTAL) UNDER THE TONGUE EVERY 5 (FIVE) MINUTES AS NEEDED FOR CHEST PAIN. 01/30/19   HaLeonie ManMD  omeprazole (PRILOSEC) 40 MG capsule Take 40 mg by mouth daily.      [provider]  oxyCODONE-acetaminophen (PERCOCET) 5-325 MG tablet Take 1 tablet by mouth every 6 (six) hours as needed for severe pain. 08/03/17   RhGabriel EaringPA-C  PROAIR HFA 108 ([phone]) MCG/ACT inhaler Inhale 2 puffs into the lungs every 6 (six) hours as needed for wheezing or shortness of breath.  05/09/17   [provider]  Probiotic Product (ALIGN PO) Take 1 capsule by mouth daily.     [provider]  promethazine (PHENERGAN) 25 MG tablet Take 25 mg by mouth every 6 (six) hours as needed for nausea or vomiting.     [provider]  pyridOXINE (VITAMIN B-6) 100 MG tablet Take 600 mg by mouth daily.     [provider]    Physical Exam: Vitals:   03/27/19 1815 03/27/19 1830 03/27/19 1845 03/27/19 1907  BP: (!) 149/64 (!) 150/66 (!) 145/57   Pulse: 96 95 92   Resp: 16 (!) 21 17   Temp:    99.6 F (37.6 C)  TempSrc:    Axillary  SpO2: 96% 93% 94%   Weight:      Height:        Constitutional: NAD, calm, comfortable Eyes: PERRL, lids and conjunctivae normal ENMT: Mucous membranes are moist. Posterior pharynx clear of any exudate or lesions.Normal dentition.  Neck: normal, supple, no masses, no thyromegaly Respiratory: clear to auscultation bilaterally, no wheezing, no crackles. Normal respiratory effort. No accessory muscle use.  Cardiovascular: Murmur present Abdomen: no tenderness, no masses palpated. No hepatosplenomegaly. Bowel sounds positive.  Musculoskeletal: no clubbing / cyanosis. No joint deformity upper and lower extremities. Good ROM, no contractures.  Normal muscle tone.    Skin: no rashes, lesions, ulcers. No induration Neurologic: CN 2-12 grossly intact. Sensation intact, DTR normal. Strength 5/5 in all 4.  Psychiatric: Mild confusion present, waxing and waning   Labs on Admission: I have personally reviewed following labs and imaging studies  CBC: Recent Labs  Lab 03/27/19 1727  WBC 7.6  NEUTROABS 6.5  HGB 11.4*  HCT 35.3*  MCV 93.4  PLT 195   Basic Metabolic Panel: Recent Labs  Lab 03/27/19 1727  NA 136  K 4.7  CL 103  CO2 24  GLUCOSE 116*  BUN 46*  CREATININE 4.14*  CALCIUM 9.6   GFR: Estimated Creatinine Clearance: 12 mL/min (A) (by C-G formula based on SCr of 4.14 mg/dL (H)). Liver Function Tests: Recent Labs  Lab 03/27/19 1727  AST 25  ALT 30  ALKPHOS 89  BILITOT 1.1  PROT 6.2*  ALBUMIN 3.2*   No results for input(s): LIPASE, AMYLASE in the last 168 hours. No results for input(s): AMMONIA in the last 168 hours. Coagulation Profile: Recent Labs  Lab 03/27/19 1727  INR 1.0   Cardiac Enzymes: No results for input(s): CKTOTAL, CKMB, CKMBINDEX, TROPONINI in the last 168 hours. BNP (last 3 results) No results for input(s): PROBNP in the last 8760 hours. HbA1C: No results for input(s): HGBA1C in the last 72 hours. CBG: No results for input(s): GLUCAP in the last 168 hours. Lipid Profile: No results for input(s): CHOL, HDL, LDLCALC, TRIG, CHOLHDL, LDLDIRECT in the last 72 hours. Thyroid Function Tests: No results for input(s): TSH, T4TOTAL, FREET4, T3FREE, THYROIDAB in the last 72 hours. Anemia Panel: No results for input(s): VITAMINB12, FOLATE, FERRITIN, TIBC, IRON, RETICCTPCT in the last 72 hours. Urine analysis:    Component Value Date/Time   COLORURINE YELLOW 03/27/2019 1735   APPEARANCEUR CLEAR 03/27/2019 1735   LABSPEC 1.012 03/27/2019 1735   PHURINE 7.0 03/27/2019 1735   GLUCOSEU NEGATIVE 03/27/2019 1735   HGBUR LARGE (A) 03/27/2019 1735   BILIRUBINUR NEGATIVE 03/27/2019 1735   KETONESUR NEGATIVE  03/27/2019 1735   PROTEINUR 100 (A) 03/27/2019 1735   UROBILINOGEN 0.2 06/10/2014 1259   NITRITE NEGATIVE 03/27/2019 1735   LEUKOCYTESUR TRACE (A) 03/27/2019 1735    Radiological Exams on Admission: DG Chest Port 1 View  Result Date: 03/27/2019 CLINICAL DATA:  Chest pain and shortness of breath. EXAM: PORTABLE CHEST 1 VIEW COMPARISON:  June 13, 2017 FINDINGS: There is stable right-sided venous Port-A-Cath positioning. Multiple sternal wires and vascular clips are noted. These are seen on the prior study. The heart size and mediastinal contours are within normal limits. There is no evidence of acute infiltrate or pneumothorax. Mild blunting of the left costophrenic angle is seen. A radiopaque fusion plate and screws are seen overlying the cervical spine. The visualized skeletal structures are otherwise unremarkable. Radiopaque surgical clips are again seen overlying the right upper quadrant. IMPRESSION: 1. Small left pleural effusion. 2. Evidence of prior median sternotomy/CABG. Electronically Signed   By: Virgina Norfolk M.D.   On: 03/27/2019 17:33    EKG: Independently reviewed.  Assessment/Plan Principal Problem:   Acute encephalopathy Active Problems:   Essential hypertension   Type 2 diabetes mellitus with renal manifestations, controlled (HCC)   CAD (coronary artery disease) of artery bypass graft   CKD (chronic kidney disease) stage 5, GFR less than 15 ml/min (HCC)   Fever    1. Acute encephalopathy - due to Fever 1. DDx of fever includes but not limited to: 1. Bacteremia /  endocarditis from dental cleaning yesterday 2. Mild COVID-19 in setting of vaccination with the h/o cough 3. Mild PNA with the h/o cough 2. Empiric rocephin / doxy for PNA coverage 3. MRSA PCR nares, dont want to do / not sick enough at this point for empiric vanc (pt with CKD stage 5), last MRSA PCR in 2019 was neg. 4. 2d echo 5. COVID test pending 6. US renal given hematuria and h/o endometrial  CA 7. Tylenol PRN fever 8. Repeat CBC/BMP in AM 9. Obtain CT head if patient not improving with treatment of fever 2. HTN - 1. Cont Lopressor and imdur 3. DM2 - 1. Hold Glipizide 2. Sensitive SSI AC 4. H/o Endometrial CA - 1A 1. In remission as far as anyone knows, surgery and rad therapy completed 5 years ago 5. CKD stage 5 - 1. Chronic, creat stable from Jan 2. Cont lasix  DVT prophylaxis: Lovenox Code Status: Full Family Communication: Spoke with husband on phone Disposition Plan: Home after cause of fever and resulting AMS determined and improved Consults called: None Admission status: Admit to inpatient  Severity of Illness: The appropriate patient status for this patient is INPATIENT. Inpatient status is judged to be reasonable and necessary in order to provide the required intensity of service to ensure the patient's safety. The patient's presenting symptoms, physical exam findings, and initial radiographic and laboratory data in the context of their chronic comorbidities is felt to place them at high risk for further clinical deterioration. Furthermore, it is not anticipated that the patient will be medically stable for discharge from the hospital within 2 midnights of admission. The following factors support the patient status of inpatient.   IP status due to acute encephalopathy (Delirium) associated with fever.   * I certify that at the point of admission it is my clinical judgment that the patient will require inpatient hospital care spanning beyond 2 midnights from the point of admission due to high intensity of service, high risk for further deterioration and high frequency of surveillance required.*    Kadeisha Betsch M. DO [facility]  How to contact the [facility] Attending or Consulting provider [place] or covering provider during after hours [place], for this patient?  1. Check the care team in [place] and look for a) attending/consulting [facility] provider listed and b)  the [facility] team listed 2. Log into [email or web address]  Amion Physician Scheduling and messaging for groups and whole hospitals  On call and physician scheduling software for group practices, residents, hospitalists and other medical providers for call, clinic, rotation and shift schedules. On[employer] is a hospital-wide system for scheduling doctors and paging doctors on call. EasyPlot is for scientific plotting and data analysis.  [email or web address]  and use [facility]'s universal password to access. If you do not have the password, please contact the hospital operator.  3. Locate the [facility] provider you are looking for under [facility] and page to a number that you can be directly reached. 4. If you still have difficulty reaching the provider, please page the [facility] (Director on Call) for the Hospitalists listed on amion for assistance.  03/27/2019, 8:04 PM

## 2019-03-28 ENCOUNTER — Inpatient Hospital Stay (HOSPITAL_COMMUNITY): Payer: Medicare PPO

## 2019-03-28 ENCOUNTER — Encounter (HOSPITAL_COMMUNITY): Payer: Self-pay | Admitting: Internal Medicine

## 2019-03-28 DIAGNOSIS — I361 Nonrheumatic tricuspid (valve) insufficiency: Secondary | ICD-10-CM

## 2019-03-28 LAB — COMPREHENSIVE METABOLIC PANEL
ALT: 42 U/L (ref 0–44)
AST: 41 U/L (ref 15–41)
Albumin: 2.7 g/dL — ABNORMAL LOW (ref 3.5–5.0)
Alkaline Phosphatase: 82 U/L (ref 38–126)
Anion gap: 16 — ABNORMAL HIGH (ref 5–15)
BUN: 50 mg/dL — ABNORMAL HIGH (ref 8–23)
CO2: 22 mmol/L (ref 22–32)
Calcium: 9.4 mg/dL (ref 8.9–10.3)
Chloride: 102 mmol/L (ref 98–111)
Creatinine, Ser: 4.58 mg/dL — ABNORMAL HIGH (ref 0.44–1.00)
GFR calc Af Amer: 10 mL/min — ABNORMAL LOW (ref >60)
GFR calc non Af Amer: 9 mL/min — ABNORMAL LOW (ref >60)
Glucose, Bld: 141 mg/dL — ABNORMAL HIGH (ref 70–99)
Potassium: 4.4 mmol/L (ref 3.5–5.1)
Sodium: 140 mmol/L (ref 135–145)
Total Bilirubin: 0.8 mg/dL (ref 0.3–1.2)
Total Protein: 5.6 g/dL — ABNORMAL LOW (ref 6.5–8.1)

## 2019-03-28 LAB — URINE CULTURE

## 2019-03-28 LAB — CBC
HCT: 34.3 % — ABNORMAL LOW (ref 36.0–46.0)
Hemoglobin: 10.8 g/dL — ABNORMAL LOW (ref 12.0–15.0)
MCH: 30.2 pg (ref 26.0–34.0)
MCHC: 31.5 g/dL (ref 30.0–36.0)
MCV: 95.8 fL (ref 80.0–100.0)
Platelets: 137 10*3/uL — ABNORMAL LOW (ref 150–400)
RBC: 3.58 MIL/uL — ABNORMAL LOW (ref 3.87–5.11)
RDW: 12.7 % (ref 11.5–15.5)
WBC: 10.2 10*3/uL (ref 4.0–10.5)
nRBC: 0 % (ref 0.0–0.2)

## 2019-03-28 LAB — ECHOCARDIOGRAM COMPLETE
Height: 60 in
Weight: 3200 oz

## 2019-03-28 LAB — CBG MONITORING, ED
Glucose-Capillary: 126 mg/dL — ABNORMAL HIGH (ref 70–99)
Glucose-Capillary: 114 mg/dL — ABNORMAL HIGH (ref 70–99)

## 2019-03-28 LAB — MAGNESIUM: Magnesium: 2.3 mg/dL (ref 1.7–2.4)

## 2019-03-28 LAB — GLUCOSE, CAPILLARY
Glucose-Capillary: 85 mg/dL (ref 70–99)
Glucose-Capillary: 148 mg/dL — ABNORMAL HIGH (ref 70–99)

## 2019-03-28 LAB — SARS CORONAVIRUS 2 (TAT 6-24 HRS): SARS Coronavirus 2: NEGATIVE

## 2019-03-28 MED ORDER — SODIUM CHLORIDE 0.9 % IV SOLN
100.0000 mg | Freq: Two times a day (BID) | INTRAVENOUS | Status: DC
  Administered 2019-03-28 – 2019-03-30 (×4): 100 mg via INTRAVENOUS
  Filled 2019-03-28 (×5): qty 100

## 2019-03-28 MED ORDER — CHLORHEXIDINE GLUCONATE CLOTH 2 % EX PADS
6.0000 | MEDICATED_PAD | Freq: Every day | CUTANEOUS | Status: DC
  Administered 2019-03-28 – 2019-03-30 (×3): 6 via TOPICAL

## 2019-03-28 NOTE — Progress Notes (Signed)
  Echocardiogram 2D Echocardiogram has been performed.  Dana Jackson 03/28/2019, 12:24 PM

## 2019-03-28 NOTE — ED Notes (Signed)
Pt ate just a few bites of her breakfast.

## 2019-03-28 NOTE — ED Notes (Signed)
Pt alert, pt confused, when asked where she is pt states "in the hospital", when asked what month it is pt states "21", when asked the year "27", when asked her age "58", when asked why she is in the ED she states "I guess I was just sleepy".

## 2019-03-28 NOTE — ED Notes (Signed)
SDU  Breakfast ordered  

## 2019-03-28 NOTE — ED Notes (Signed)
Pt taken to and from restroom with this RN using wheelchair. Pt able to transfer without difficulty and a standby assist. Pt following commands. Pt is able to tell me that she is in the hospital, not why, not time, not her age, not her history.

## 2019-03-28 NOTE — Progress Notes (Signed)
[name]  PROGRESS NOTE    Phyillis Dascoli  BZJ:[id number] DOB: Jul 20, 1945 DOA: 03/27/2019 PCP: Heywood Bene, PA-C   Brief Narrative:   Dana Jackson is a 74 y.o. 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 Pt with 4-5 day h/o cough per husband. Yesterday patient had dental cleaning. Today patient with confusion, AMS this morning.  LKW 2100 last evening. Woke up disoriented to place and situation, ordinarily AAOx4. Had completed COVID vaccines, (1/22 and 2/12).  03/28/19: Fever ON. UCx w/ multiple species, suggesting recollection. However, she's been on abx at thsi point. Other Cx is neg to date. Otherwise, looking ok this AM. Likely d/c in the AM.   Assessment & Plan:   Principal Problem:   Acute encephalopathy Active Problems:   Essential hypertension   Type 2 diabetes mellitus with renal manifestations, controlled ([place])   CAD (coronary artery disease) of artery bypass graft   CKD (chronic kidney disease) stage 5, GFR less than 15 ml/min (H[facility])   Fever  Acute encephalopathy, etiology unknown FUO     - Bld Cx NTD     - UCx w/ multiple species     - CT head negative     - CXR w/ small pleural effusion     - fevers ON, but WBC ok.      - has been on rocephin, will continue for now; not sure of source     - mentation is improving  DM2     - on glypizide at home     - continue SSI for now     - DM diet  HTN     - only getting metoprolol from her home regimen; other meds held     - BP is ok for now  CAD/Hx of CABG     - continue statin, ASA, metoprolol  CKD V      - Care Everywhere shows baseline SCr 4.31      - she is at baseline  DVT prophylaxis: lovenox Code Status: FULL Family Communication: None at beside   Disposition Plan: Likely d/c in AM if stable  Antimicrobials:  . Rocephin   ROS:  Denies CP, N, V, HA . Remainder 10-pt ROS is negative for all not previously mentioned.  Subjective: "Well,  I'm not sure."  Objective: Vitals:   03/28/19 1445 03/28/19 1515 03/28/19 1517 03/28/19 1530  BP: 103/64 (!) 124/58 (!) 127/58 (!) 119/53  Pulse: 65 66 70 73  Resp: 17 16 18 20   Temp:      TempSrc:      SpO2: 95% 95% 99% 97%  Weight:      Height:        Intake/Output Summary (Last 24 hours) at 03/28/2019 1936 Last data filed at 03/28/2019 1600 Gross per 24 hour  Intake 250 ml  Output --  Net 250 ml   Filed Weights   03/27/19 1652  Weight: 90.7 kg    Examination:  General: 74 y.o. female resting in bed in NAD Cardiovascular: RRR, +S1, S2, no m/g/r, equal pulses throughout Respiratory: CTABL, no w/r/r, normal WOB GI: BS+, NDNT, no masses noted, no organomegaly noted MSK: No e/c/c Skin: No rashes, bruises, ulcerations noted Neuro: A&O x 3, no focal deficits Psyc: Appropriate interaction and affect, calm/cooperative   Data Reviewed: I have personally reviewed following labs and imaging studies.  CBC: Recent Labs  Lab 03/27/19 1727 03/28/19 0436  WBC 7.6 10.2  NEUTROABS 6.5  --   HGB 11.4* 10.8*  HCT 35.3* 34.3*  MCV 93.4 95.8  PLT 160 008*   Basic Metabolic Panel: Recent Labs  Lab 03/27/19 1727 03/28/19 0436  NA 136 140  K 4.7 4.4  CL 103 102  CO2 24 22  GLUCOSE 116* 141*  BUN 46* 50*  CREATININE 4.14* 4.58*  CALCIUM 9.6 9.4  MG  --  2.3   GFR: Estimated Creatinine Clearance: 10.8 mL/min (A) (by C-G formula based on SCr of 4.58 mg/dL (H)). Liver Function Tests: Recent Labs  Lab 03/27/19 1727 03/28/19 0436  AST 25 41  ALT 30 42  ALKPHOS 89 82  BILITOT 1.1 0.8  PROT 6.2* 5.6*  ALBUMIN 3.2* 2.7*   No results for input(s): LIPASE, AMYLASE in the last 168 hours. No results for input(s): AMMONIA in the last 168 hours. Coagulation Profile: Recent Labs  Lab 03/27/19 1727  INR 1.0   Cardiac Enzymes: No results for input(s): CKTOTAL, CKMB, CKMBINDEX, TROPONINI in the last 168 hours. BNP (last 3 results) No results for input(s): PROBNP in  the last 8760 hours. HbA1C: Recent Labs    03/27/19 2035  HGBA1C 5.7*   CBG: Recent Labs  Lab 03/27/19 2155 03/28/19 0745 03/28/19 1407 03/28/19 1734  GLUCAP 95 126* 114* 85   Lipid Profile: No results for input(s): CHOL, HDL, LDLCALC, TRIG, CHOLHDL, LDLDIRECT in the last 72 hours. Thyroid Function Tests: No results for input(s): TSH, T4TOTAL, FREET4, T3FREE, THYROIDAB in the last 72 hours. Anemia Panel: No results for input(s): VITAMINB12, FOLATE, FERRITIN, TIBC, IRON, RETICCTPCT in the last 72 hours. Sepsis Labs: Recent Labs  Lab 03/27/19 1727  LATICACIDVEN 1.4    Recent Results (from the past 240 hour(s))  Blood Culture (routine x 2)     Status: None (Preliminary result)   Collection Time: 03/27/19  5:27 PM   Specimen: BLOOD  Result Value Ref Range Status   Specimen Description BLOOD RIGHT ANTECUBITAL  Final   Special Requests   Final    BOTTLES DRAWN AEROBIC AND ANAEROBIC Blood Culture results may not be optimal due to an inadequate volume of blood received in culture bottles   Culture   Final    NO GROWTH < 24 HOURS Performed at [facility], [place] [address], [place], [place] [address]    Report Status PENDING  Incomplete  Urine culture     Status: Abnormal   Collection Time: 03/27/19  5:34 PM   Specimen: In/Out Cath Urine  Result Value Ref Range Status   Specimen Description IN/OUT CATH URINE  Final   Special Requests   Final    NONE Performed at [facility], [place] [address], [place], [place] [address]    Culture MULTIPLE SPECIES PRESENT, SUGGEST RECOLLECTION (A)  Final   Report Status 03/28/2019 FINAL  Final  SARS CORONAVIRUS 2 (TAT 6-24 HRS) Nasopharyngeal Nasopharyngeal Swab     Status: None   Collection Time: 03/27/19  7:22 PM   Specimen: Nasopharyngeal Swab  Result Value Ref Range Status   SARS Coronavirus 2 NEGATIVE NEGATIVE Final    Comment: (NOTE) SARS-CoV-2 target nucleic acids are NOT DETECTED. The SARS-CoV-2 RNA is generally  detectable in upper and lower respiratory specimens during the acute phase of infection. Negative results do not preclude SARS-CoV-2 infection, do not rule out co-infections with other pathogens, and should not be used as the sole basis for treatment or other patient management decisions. Negative results must be combined with  clinical observations, patient history, and epidemiological information. The expected result is Negative. Fact Sheet for Patients: SugarRoll.be Fact Sheet for Healthcare Providers: https://www.woods-mathews.com/ This test is not yet approved or cleared by the [place] FDA and  has been authorized for detection and/or diagnosis of SARS-CoV-2 by FDA under an Emergency Use Authorization (EUA). This EUA will remain  in effect (meaning this test can be used) for the duration of the COVID-19 declaration under Section 56 4(b)(1) of the Act, 21 U.S.C. section 360bbb-3(b)(1), unless the authorization is terminated or revoked sooner. Performed at [facility], [place] [address] [place], [place] [address]   MRSA PCR Screening     Status: None   Collection Time: 03/27/19  8:35 PM   Specimen: Nasal Mucosa; Nasopharyngeal  Result Value Ref Range Status   MRSA by PCR NEGATIVE NEGATIVE Final    Comment:        The GeneXpert MRSA Assay (FDA approved for NASAL specimens only), is one component of a comprehensive MRSA colonization surveillance program. It is not intended to diagnose MRSA infection nor to guide or monitor treatment for MRSA infections. Performed at [facility], [place] [address] [place], [place] [address]       Radiology Studies: CT Head Wo Contrast  Result Date: 03/27/2019 CLINICAL DATA:  Altered mental status. EXAM: CT HEAD WITHOUT CONTRAST TECHNIQUE: Contiguous axial images were obtained from the base of the skull through the vertex without intravenous contrast. COMPARISON:  November 22, 2015  FINDINGS: Brain: No evidence of acute infarction, hemorrhage, hydrocephalus, extra-axial collection or mass lesion/mass effect. Atrophy and chronic microvascular ischemic changes are noted. Vascular: No hyperdense vessel or unexpected calcification. Skull: Normal. Negative for fracture or focal lesion. Sinuses/Orbits: No acute finding. Other: None. IMPRESSION: 1. No acute intracranial abnormality. 2. Atrophy and chronic microvascular ischemic changes are noted. Electronically Signed   By: Constance Holster M.D.   On: 03/27/2019 23:32   US RENAL  Result Date: 03/28/2019 CLINICAL DATA:  74 year old female with history of renal carcinoma and partial left nephrectomy. Evaluate for hydronephrosis. EXAM: RENAL / URINARY TRACT ULTRASOUND COMPLETE COMPARISON:  Abdominal MRI dated 09/20/2018 and renal ultrasound dated 10/15/2015. FINDINGS: Right Kidney: Renal measurements: 10.2 x 5.3 x 4.3 cm = volume: 120 mL. There is diffuse increased renal parenchymal echogenicity. No hydronephrosis or shadowing stone. There is a 4 cm inferior pole cyst. Left Kidney: Renal measurements: 10.6 x 4.9 x 5.1 cm = volume: 137 mL. Mild increased echogenicity. No hydronephrosis or shadowing stone. There is a 2.5 cm upper pole cyst. Bladder: Appears normal for degree of bladder distention. Other: None. IMPRESSION: 1. Mild increased echogenicity likely related to underlying medical renal disease. 2. No hydronephrosis or shadowing stone. Electronically Signed   By: Anner Crete M.D.   On: 03/28/2019 03:23   DG Chest Port 1 View  Result Date: 03/27/2019 CLINICAL DATA:  Chest pain and shortness of breath. EXAM: PORTABLE CHEST 1 VIEW COMPARISON:  June 13, 2017 FINDINGS: There is stable right-sided venous Port-A-Cath positioning. Multiple sternal wires and vascular clips are noted. These are seen on the prior study. The heart size and mediastinal contours are within normal limits. There is no evidence of acute infiltrate or pneumothorax.  Mild blunting of the left costophrenic angle is seen. A radiopaque fusion plate and screws are seen overlying the cervical spine. The visualized skeletal structures are otherwise unremarkable. Radiopaque surgical clips are again seen overlying the right upper quadrant. IMPRESSION: 1. Small left pleural effusion. 2.  Evidence of prior median sternotomy/CABG. Electronically Signed   By: Virgina Norfolk M.D.   On: 03/27/2019 17:33   ECHOCARDIOGRAM COMPLETE  Result Date: 03/28/2019    ECHOCARDIOGRAM REPORT   Patient Name:   Dana Jackson Date of Exam: 03/28/2019 Medical Rec #:  [id number]      Height:       60.0 in Accession #:    [id number]     Weight:       200.0 lb Date of Birth:  [birth year]     BSA:          1.867 m Patient Age:    35 years       BP:           107/63 mmHg Patient Gender: F              HR:           60 bpm. Exam Location:  Inpatient Procedure: 2D Echo Indications:    murmur 785.2 fever 780.6  History:        Patient has prior history of Echocardiogram examinations, most                 recent 12/08/2010.  Sonographer:    Jannett Celestine RDCS (AE) Referring Phys: [phone] JARED M GARDNER  Sonographer Comments: restricted mobility IMPRESSIONS  1. Left ventricular ejection fraction, by estimation, is 65 to 70%. The left ventricle has normal function. The left ventricle has no regional wall motion abnormalities. There is mild left ventricular hypertrophy. Left ventricular diastolic parameters are indeterminate.  2. Right ventricule is not well visualized but systolic function appears grossly normal. The right ventricular size is normal. Tricuspid regurgitation signal is inadequate for assessing PA pressure.  3. The mitral valve is abnormal. Moderate mitral annular calcification. Trivial mitral valve regurgitation.  4. The aortic valve is tricuspid. Aortic valve regurgitation is trivial. Mild to moderate aortic valve sclerosis/calcification is present, without any evidence of aortic stenosis.  5. Aortic  dilatation noted. 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  6. The inferior vena cava is normal in size with greater than 50% respiratory variability, suggesting right atrial pressure of 3 mmHg. FINDINGS  Left Ventricle: Left ventricular ejection fraction, by estimation, is 65 to 70%. The left ventricle has normal function. The left ventricle has no regional wall motion abnormalities. The left ventricular internal cavity size was normal in size. There is  mild left ventricular hypertrophy. Left ventricular diastolic parameters are indeterminate. Right Ventricle: The right ventricular size is normal. No increase in right ventricular wall thickness. Right ventricular systolic function is normal. Tricuspid regurgitation signal is inadequate for assessing PA pressure. Left Atrium: Left atrial size was normal in size. Right Atrium: Right atrial size was not well visualized. Pericardium: Trivial pericardial effusion is present. Mitral Valve: The mitral valve is abnormal. Moderate mitral annular calcification. Trivial mitral valve regurgitation. Tricuspid Valve: The tricuspid valve is normal in structure. Tricuspid valve regurgitation is mild. Aortic Valve: The aortic valve is tricuspid. Aortic valve regurgitation is trivial. Mild to moderate aortic valve sclerosis/calcification is present, without any evidence of aortic stenosis. Pulmonic Valve: The pulmonic valve was not well visualized. Pulmonic valve regurgitation is trivial. Aorta: The aortic root is normal in size and structure and aortic dilatation noted. 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 Venous: The inferior vena cava is normal in size with greater than 50% respiratory variability, suggesting right atrial pressure of 3 mmHg.  IAS/Shunts: No atrial level shunt detected by color flow Doppler.  LEFT VENTRICLE PLAX 2D LVIDd:         3.80 cm  Diastology LVIDs:         1.91 cm  LV e' lateral:   9.03 cm/s LV PW:          1.12 cm  LV E/e' lateral: 11.1 LV IVS:        1.07 cm  LV e' medial:    6.20 cm/s LVOT diam:     1.90 cm  LV E/e' medial:  16.1 LV SV:         64 LV SV Index:   34 LVOT Area:     2.84 cm  LEFT ATRIUM             Index LA diam:        3.30 cm 1.77 cm/m LA Vol (A2C):   38.5 ml 20.63 ml/m LA Vol (A4C):   46.2 ml 24.75 ml/m LA Biplane Vol: 43.9 ml 23.52 ml/m  AORTIC VALVE LVOT Vmax:   88.60 cm/s LVOT Vmean:  60.800 cm/s LVOT VTI:    0.224 m  AORTA Ao Root diam: 2.80 cm MITRAL VALVE MV Area (PHT): 2.66 cm    SHUNTS MV Decel Time: 285 msec    Systemic VTI:  0.22 m MV E velocity: 99.80 cm/s  Systemic Diam: 1.90 cm MV A velocity: 70.40 cm/s MV E/A ratio:  1.42 Oswaldo Milian MD Electronically signed by Oswaldo Milian MD Signature Date/Time: 03/28/2019/4:42:56 PM    Final      Scheduled Meds: . aspirin  81 mg Oral QHS  . atorvastatin  40 mg Oral QHS  . enoxaparin (LOVENOX) injection  30 mg Subcutaneous Q24H  . furosemide  20 mg Oral QHS  . gabapentin  100 mg Oral TID  . insulin aspart  0-9 Units Subcutaneous TID WC  . isosorbide mononitrate  60 mg Oral Daily  . metoprolol tartrate  50 mg Oral BID  . pantoprazole  80 mg Oral Daily   Continuous Infusions: . cefTRIAXone (ROCEPHIN)  IV Stopped (03/27/19 2106)  . doxycycline (VIBRAMYCIN) IV Stopped (03/28/19 1731)     LOS: 1 day    Time spent: 25 minutes spent in the coordination of care today.    Jonnie Finner, DO [facility]  If 7PM-7AM, please contact night-coverage [email or web address] 03/28/2019, 7:36 PM

## 2019-03-28 NOTE — ED Notes (Signed)
Breakfast tray given to pt 

## 2019-03-28 NOTE — ED Notes (Signed)
Lunch Tray Ordered @ 1028. 

## 2019-03-29 ENCOUNTER — Inpatient Hospital Stay (HOSPITAL_COMMUNITY): Payer: Medicare PPO

## 2019-03-29 DIAGNOSIS — K5900 Constipation, unspecified: Secondary | ICD-10-CM

## 2019-03-29 DIAGNOSIS — N179 Acute kidney failure, unspecified: Secondary | ICD-10-CM

## 2019-03-29 LAB — CBC WITH DIFFERENTIAL/PLATELET
Abs Immature Granulocytes: 0.03 10*3/uL (ref 0.00–0.07)
Basophils Absolute: 0 10*3/uL (ref 0.0–0.1)
Basophils Relative: 1 %
Eosinophils Absolute: 0.2 10*3/uL (ref 0.0–0.5)
Eosinophils Relative: 3 %
HCT: 31 % — ABNORMAL LOW (ref 36.0–46.0)
Hemoglobin: 10 g/dL — ABNORMAL LOW (ref 12.0–15.0)
Immature Granulocytes: 0 %
Lymphocytes Relative: 15 %
Lymphs Abs: 1.1 10*3/uL (ref 0.7–4.0)
MCH: 30.4 pg (ref 26.0–34.0)
MCHC: 32.3 g/dL (ref 30.0–36.0)
MCV: 94.2 fL (ref 80.0–100.0)
Monocytes Absolute: 0.9 10*3/uL (ref 0.1–1.0)
Monocytes Relative: 12 %
Neutro Abs: 5.2 10*3/uL (ref 1.7–7.7)
Neutrophils Relative %: 69 %
Platelets: 169 10*3/uL (ref 150–400)
RBC: 3.29 MIL/uL — ABNORMAL LOW (ref 3.87–5.11)
RDW: 13.1 % (ref 11.5–15.5)
WBC: 7.6 10*3/uL (ref 4.0–10.5)
nRBC: 0 % (ref 0.0–0.2)

## 2019-03-29 LAB — COMPREHENSIVE METABOLIC PANEL
ALT: 39 U/L (ref 0–44)
AST: 34 U/L (ref 15–41)
Albumin: 2.7 g/dL — ABNORMAL LOW (ref 3.5–5.0)
Alkaline Phosphatase: 72 U/L (ref 38–126)
Anion gap: 11 (ref 5–15)
BUN: 61 mg/dL — ABNORMAL HIGH (ref 8–23)
CO2: 22 mmol/L (ref 22–32)
Calcium: 9.6 mg/dL (ref 8.9–10.3)
Chloride: 103 mmol/L (ref 98–111)
Creatinine, Ser: 5.02 mg/dL — ABNORMAL HIGH (ref 0.44–1.00)
GFR calc Af Amer: 9 mL/min — ABNORMAL LOW (ref >60)
GFR calc non Af Amer: 8 mL/min — ABNORMAL LOW (ref >60)
Glucose, Bld: 153 mg/dL — ABNORMAL HIGH (ref 70–99)
Potassium: 5.1 mmol/L (ref 3.5–5.1)
Sodium: 136 mmol/L (ref 135–145)
Total Bilirubin: 0.5 mg/dL (ref 0.3–1.2)
Total Protein: 5.5 g/dL — ABNORMAL LOW (ref 6.5–8.1)

## 2019-03-29 LAB — GLUCOSE, CAPILLARY
Glucose-Capillary: 116 mg/dL — ABNORMAL HIGH (ref 70–99)
Glucose-Capillary: 120 mg/dL — ABNORMAL HIGH (ref 70–99)
Glucose-Capillary: 126 mg/dL — ABNORMAL HIGH (ref 70–99)
Glucose-Capillary: 150 mg/dL — ABNORMAL HIGH (ref 70–99)
Glucose-Capillary: 91 mg/dL (ref 70–99)

## 2019-03-29 MED ORDER — SENNOSIDES-DOCUSATE SODIUM 8.6-50 MG PO TABS
2.0000 | ORAL_TABLET | Freq: Every day | ORAL | Status: DC
  Administered 2019-03-29 – 2019-03-30 (×2): 2 via ORAL
  Filled 2019-03-29 (×2): qty 2

## 2019-03-29 MED ORDER — SODIUM CHLORIDE 0.9 % IV BOLUS
500.0000 mL | Freq: Once | INTRAVENOUS | Status: AC
  Administered 2019-03-29: 500 mL via INTRAVENOUS

## 2019-03-29 NOTE — Plan of Care (Signed)

## 2019-03-29 NOTE — Progress Notes (Signed)
[name]  PROGRESS NOTE    Dana Jackson  HEN:[id number] DOB: Aug 21, 1945 DOA: 03/27/2019 PCP: Heywood Bene, PA-C   Brief Narrative:   Dana Jackson a 74 y.o.femalewith medical history significant ofCKD stage 5 not yet on dialysis, DM2, HTN, CAD s/p CABG. Pt with 4-5 day h/o cough per husband. Yesterday patient had dental cleaning. Today patient with confusion, AMS this morning. LKW 2100 last evening.Woke up disoriented to place and situation, ordinarily AAOx4. Had completed COVID vaccines, (1/22 and 2/12).  03/29/19: Fever ON. UCx w/ multiple species, suggesting recollection. However, she's been on abx at thsi point. Other Cx is neg to date. Otherwise, looking ok this AM. Likely d/c in the AM.    Assessment & Plan:   Principal Problem:   Acute encephalopathy Active Problems:   Essential hypertension   Type 2 diabetes mellitus with renal manifestations, controlled ([facility])   CAD (coronary artery disease) of artery bypass graft   CKD (chronic kidney disease) stage 5, GFR less than 15 ml/min (HCC)   Fever  Acute encephalopathy, etiology unknown FUO     - Bld Cx NTD     - UCx w/ multiple species     - CT head negative     - CXR w/ small pleural effusion     - fevers ON, but WBC ok.      - has been on rocephin, will continue for now; not sure of source     - 3/18: last dose of rocephin tonight, mentation is a little bit better today, but per nursing she is still having intermittent confusion. After speaking with husband, he says this is about normal for her.  DM2     - on glypizide at home     - continue SSI for now     - DM diet  HTN     - only getting metoprolol from her home regimen; other meds held     - BP is ok for now  CAD/Hx of CABG     - continue statin, ASA, metoprolol  AKI on CKD V      - Care Everywhere shows baseline SCr 4.31      - 3/18: Her Scr has bumped up to 5 and BUN is up; intermittent confusion, spoke with nephro; will hold lasix and  add a 500cc bolus. If renal function does not flatten, will consult renal formally in AM  DVT prophylaxis: lovenox Code Status: FULL Family Communication: Spoke with husband by phone   Disposition Plan: Renal function worsening. Will give IVF and hold lasix. If improved, likely d/c in AM. If not, will need to formally consult nephro.  Antimicrobials:   Rocephin   ROS:  Denies N, V, ab pain, dyspnea, CP . Remainder 10-pt ROS is negative for all not previously mentioned.  Subjective: "I'm walking ok."  Objective: Vitals:   03/28/19 2333 03/28/19 2343 03/29/19 0429 03/29/19 0816  BP:  (!) 114/52 135/67 (!) 110/57  Pulse: 70 73 61 68  Resp: 13 20 17 15   Temp: 97.6 F (36.4 C)   98.7 F (37.1 C)  TempSrc: Oral   Oral  SpO2: 95% 97% 99% 97%  Weight:      Height:        Intake/Output Summary (Last 24 hours) at 03/29/2019 1430 Last data filed at 03/29/2019 0700 Gross per 24 hour  Intake 470 ml  Output --  Net 470 ml   Filed Weights   03/27/19 1652  Weight: 90.7 kg  Examination:  General: 74 y.o. female resting in bed in NAD Cardiovascular: RRR, +S1, S2, no m/g/r, equal pulses throughout Respiratory: CTABL, no w/r/r, normal WOB GI: BS+, NDNT, no masses noted, no organomegaly noted MSK: No e/c/c Neuro: A&O x 3, no focal deficits Psyc: Appropriate interaction and affect, calm/cooperative   Data Reviewed: I have personally reviewed following labs and imaging studies.  CBC: Recent Labs  Lab 03/27/19 1727 03/28/19 0436 03/29/19 1245  WBC 7.6 10.2 7.6  NEUTROABS 6.5  --  5.2  HGB 11.4* 10.8* 10.0*  HCT 35.3* 34.3* 31.0*  MCV 93.4 95.8 94.2  PLT 160 137* 735   Basic Metabolic Panel: Recent Labs  Lab 03/27/19 1727 03/28/19 0436 03/29/19 1245  NA 136 140 136  K 4.7 4.4 5.1  CL 103 102 103  CO2 24 22 22   GLUCOSE 116* 141* 153*  BUN 46* 50* 61*  CREATININE 4.14* 4.58* 5.02*  CALCIUM 9.6 9.4 9.6  MG  --  2.3  --    GFR: Estimated Creatinine Clearance:  9.9 mL/min (A) (by C-G formula based on SCr of 5.02 mg/dL (H)). Liver Function Tests: Recent Labs  Lab 03/27/19 1727 03/28/19 0436 03/29/19 1245  AST 25 41 34  ALT 30 42 39  ALKPHOS 89 82 72  BILITOT 1.1 0.8 0.5  PROT 6.2* 5.6* 5.5*  ALBUMIN 3.2* 2.7* 2.7*   No results for input(s): LIPASE, AMYLASE in the last 168 hours. No results for input(s): AMMONIA in the last 168 hours. Coagulation Profile: Recent Labs  Lab 03/27/19 1727  INR 1.0   Cardiac Enzymes: No results for input(s): CKTOTAL, CKMB, CKMBINDEX, TROPONINI in the last 168 hours. BNP (last 3 results) No results for input(s): PROBNP in the last 8760 hours. HbA1C: Recent Labs    03/27/19 2035  HGBA1C 5.7*   CBG: Recent Labs  Lab 03/28/19 1734 03/28/19 2108 03/28/19 2358 03/29/19 0818 03/29/19 1203  GLUCAP 85 148* 120* 116* 150*   Lipid Profile: No results for input(s): CHOL, HDL, LDLCALC, TRIG, CHOLHDL, LDLDIRECT in the last 72 hours. Thyroid Function Tests: No results for input(s): TSH, T4TOTAL, FREET4, T3FREE, THYROIDAB in the last 72 hours. Anemia Panel: No results for input(s): VITAMINB12, FOLATE, FERRITIN, TIBC, IRON, RETICCTPCT in the last 72 hours. Sepsis Labs: Recent Labs  Lab 03/27/19 1727  LATICACIDVEN 1.4    Recent Results (from the past 240 hour(s))  Blood Culture (routine x 2)     Status: None (Preliminary result)   Collection Time: 03/27/19  5:27 PM   Specimen: BLOOD  Result Value Ref Range Status   Specimen Description BLOOD RIGHT ANTECUBITAL  Final   Special Requests   Final    BOTTLES DRAWN AEROBIC AND ANAEROBIC Blood Culture results may not be optimal due to an inadequate volume of blood received in culture bottles   Culture   Final    NO GROWTH 2 DAYS Performed at [facility], [place] [address] [place], [place] [address]    Report Status PENDING  Incomplete  Urine culture     Status: Abnormal   Collection Time: 03/27/19  5:34 PM   Specimen: In/Out Cath Urine  Result  Value Ref Range Status   Specimen Description IN/OUT CATH URINE  Final   Special Requests   Final    NONE Performed at [facility], [place] [address], [place],  [address]    Culture MULTIPLE SPECIES PRESENT, SUGGEST RECOLLECTION (A)  Final   Report Status 03/28/2019 FINAL  Final  SARS CORONAVIRUS 2 (TAT 6-24 HRS) Nasopharyngeal Nasopharyngeal Swab     Status: None   Collection Time: 03/27/19  7:22 PM   Specimen: Nasopharyngeal Swab  Result Value Ref Range Status   SARS Coronavirus 2 NEGATIVE NEGATIVE Final    Comment: (NOTE) SARS-CoV-2 target nucleic acids are NOT DETECTED. The SARS-CoV-2 RNA is generally detectable in upper and lower respiratory specimens during the acute phase of infection. Negative results do not preclude SARS-CoV-2 infection, do not rule out co-infections with other pathogens, and should not be used as the sole basis for treatment or other patient management decisions. Negative results must be combined with clinical observations, patient history, and epidemiological information. The expected result is Negative. Fact Sheet for Patients: SugarRoll.be Fact Sheet for Healthcare Providers: https://www.woods-mathews.com/ This test is not yet approved or cleared by the [place] FDA and  has been authorized for detection and/or diagnosis of SARS-CoV-2 by FDA under an Emergency Use Authorization (EUA). This EUA will remain  in effect (meaning this test can be used) for the duration of the COVID-19 declaration under Section 56 4(b)(1) of the Act, 21 U.S.C. section 360bbb-3(b)(1), unless the authorization is terminated or revoked sooner. Performed at [facility], [place] [address], [place], [place] [address]   MRSA PCR Screening     Status: None   Collection Time: 03/27/19  8:35 PM   Specimen: Nasal Mucosa; Nasopharyngeal  Result Value Ref Range Status   MRSA by PCR NEGATIVE NEGATIVE Final    Comment:         The GeneXpert MRSA Assay (FDA approved for NASAL specimens only), is one component of a comprehensive MRSA colonization surveillance program. It is not intended to diagnose MRSA infection nor to guide or monitor treatment for MRSA infections. Performed at [facility], [place] [address], [place], [place] [address]   Blood Culture (routine x 2)     Status: None (Preliminary result)   Collection Time: 03/28/19  5:00 AM   Specimen: BLOOD RIGHT HAND  Result Value Ref Range Status   Specimen Description BLOOD RIGHT HAND  Final   Special Requests   Final    BOTTLES DRAWN AEROBIC ONLY Blood Culture results may not be optimal due to an inadequate volume of blood received in culture bottles   Culture   Final    NO GROWTH 1 DAY Performed at [facility], [place] [address] [place], [place] [address]    Report Status PENDING  Incomplete      Radiology Studies: DG Abd 1 View  Result Date: 03/29/2019 CLINICAL DATA:  Constipation 4 days. EXAM: ABDOMEN - 1 VIEW COMPARISON:  11/24/2008 FINDINGS: Bowel gas pattern is nonobstructive. Mild to moderate fecal retention over the right colon. There are surgical clips over the right upper quadrant. No free peritoneal air. Moderate curvature of the lumbar spine convex left with significant degenerative changes. Mild degenerate change of the hips. IMPRESSION: Nonobstructive bowel gas pattern with mild to moderate fecal retention over the right colon. Electronically Signed   By: Marin Olp M.D.   On: 03/29/2019 13:33   CT Head Wo Contrast  Result Date: 03/27/2019 CLINICAL DATA:  Altered mental status. EXAM: CT HEAD WITHOUT CONTRAST TECHNIQUE: Contiguous axial images were obtained from the base of the skull through the vertex without intravenous contrast. COMPARISON:  November 22, 2015 FINDINGS: Brain: No evidence of acute infarction, hemorrhage, hydrocephalus, extra-axial collection or mass lesion/mass effect. Atrophy and chronic microvascular  ischemic changes are noted. Vascular:  No hyperdense vessel or unexpected calcification. Skull: Normal. Negative for fracture or focal lesion. Sinuses/Orbits: No acute finding. Other: None. IMPRESSION: 1. No acute intracranial abnormality. 2. Atrophy and chronic microvascular ischemic changes are noted. Electronically Signed   By: Constance Holster M.D.   On: 03/27/2019 23:32   US RENAL  Result Date: 03/28/2019 CLINICAL DATA:  74 year old female with history of renal carcinoma and partial left nephrectomy. Evaluate for hydronephrosis. EXAM: RENAL / URINARY TRACT ULTRASOUND COMPLETE COMPARISON:  Abdominal MRI dated 09/20/2018 and renal ultrasound dated 10/15/2015. FINDINGS: Right Kidney: Renal measurements: 10.2 x 5.3 x 4.3 cm = volume: 120 mL. There is diffuse increased renal parenchymal echogenicity. No hydronephrosis or shadowing stone. There is a 4 cm inferior pole cyst. Left Kidney: Renal measurements: 10.6 x 4.9 x 5.1 cm = volume: 137 mL. Mild increased echogenicity. No hydronephrosis or shadowing stone. There is a 2.5 cm upper pole cyst. Bladder: Appears normal for degree of bladder distention. Other: None. IMPRESSION: 1. Mild increased echogenicity likely related to underlying medical renal disease. 2. No hydronephrosis or shadowing stone. Electronically Signed   By: Anner Crete M.D.   On: 03/28/2019 03:23   DG Chest Port 1 View  Result Date: 03/27/2019 CLINICAL DATA:  Chest pain and shortness of breath. EXAM: PORTABLE CHEST 1 VIEW COMPARISON:  June 13, 2017 FINDINGS: There is stable right-sided venous Port-A-Cath positioning. Multiple sternal wires and vascular clips are noted. These are seen on the prior study. The heart size and mediastinal contours are within normal limits. There is no evidence of acute infiltrate or pneumothorax. Mild blunting of the left costophrenic angle is seen. A radiopaque fusion plate and screws are seen overlying the cervical spine. The visualized skeletal structures  are otherwise unremarkable. Radiopaque surgical clips are again seen overlying the right upper quadrant. IMPRESSION: 1. Small left pleural effusion. 2. Evidence of prior median sternotomy/CABG. Electronically Signed   By: Virgina Norfolk M.D.   On: 03/27/2019 17:33   ECHOCARDIOGRAM COMPLETE  Result Date: 03/28/2019    ECHOCARDIOGRAM REPORT   Patient Name:   LINDEY RENZULLI Date of Exam: 03/28/2019 Medical Rec #:  [id number]      Height:       60.0 in Accession #:    [id number]     Weight:       200.0 lb Date of Birth:  1945-07-10     BSA:          1.867 m Patient Age:    44 years       BP:           107/63 mmHg Patient Gender: F              HR:           60 bpm. Exam Location:  Inpatient Procedure: 2D Echo Indications:    murmur 785.2 fever 780.6  History:        Patient has prior history of Echocardiogram examinations, most                 recent 12/08/2010.  Sonographer:    Jannett Celestine RDCS (AE) Referring Phys: [phone] JARED M GARDNER  Sonographer Comments: restricted mobility IMPRESSIONS  1. Left ventricular ejection fraction, by estimation, is 65 to 70%. The left ventricle has normal function. The left ventricle has no regional wall motion abnormalities. There is mild left ventricular hypertrophy. Left ventricular diastolic parameters are indeterminate.  2. Right ventricule is not well visualized but systolic function appears grossly normal.  The right ventricular size is normal. Tricuspid regurgitation signal is inadequate for assessing PA pressure.  3. The mitral valve is abnormal. Moderate mitral annular calcification. Trivial mitral valve regurgitation.  4. The aortic valve is tricuspid. Aortic valve regurgitation is trivial. Mild to moderate aortic valve sclerosis/calcification is present, without any evidence of aortic stenosis.  5. Aortic dilatation noted. 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  6. The inferior vena cava is normal in size with greater than 50% respiratory  variability, suggesting right atrial pressure of 3 mmHg. FINDINGS  Left Ventricle: Left ventricular ejection fraction, by estimation, is 65 to 70%. The left ventricle has normal function. The left ventricle has no regional wall motion abnormalities. The left ventricular internal cavity size was normal in size. There is  mild left ventricular hypertrophy. Left ventricular diastolic parameters are indeterminate. Right Ventricle: The right ventricular size is normal. No increase in right ventricular wall thickness. Right ventricular systolic function is normal. Tricuspid regurgitation signal is inadequate for assessing PA pressure. Left Atrium: Left atrial size was normal in size. Right Atrium: Right atrial size was not well visualized. Pericardium: Trivial pericardial effusion is present. Mitral Valve: The mitral valve is abnormal. Moderate mitral annular calcification. Trivial mitral valve regurgitation. Tricuspid Valve: The tricuspid valve is normal in structure. Tricuspid valve regurgitation is mild. Aortic Valve: The aortic valve is tricuspid. Aortic valve regurgitation is trivial. Mild to moderate aortic valve sclerosis/calcification is present, without any evidence of aortic stenosis. Pulmonic Valve: The pulmonic valve was not well visualized. Pulmonic valve regurgitation is trivial. Aorta: The aortic root is normal in size and structure and aortic dilatation noted. 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 Venous: The inferior vena cava is normal in size with greater than 50% respiratory variability, suggesting right atrial pressure of 3 mmHg. IAS/Shunts: No atrial level shunt detected by color flow Doppler.  LEFT VENTRICLE PLAX 2D LVIDd:         3.80 cm  Diastology LVIDs:         1.91 cm  LV e' lateral:   9.03 cm/s LV PW:         1.12 cm  LV E/e' lateral: 11.1 LV IVS:        1.07 cm  LV e' medial:    6.20 cm/s LVOT diam:     1.90 cm  LV E/e' medial:  16.1 LV SV:         64 LV SV Index:    34 LVOT Area:     2.84 cm  LEFT ATRIUM             Index LA diam:        3.30 cm 1.77 cm/m LA Vol (A2C):   38.5 ml 20.63 ml/m LA Vol (A4C):   46.2 ml 24.75 ml/m LA Biplane Vol: 43.9 ml 23.52 ml/m  AORTIC VALVE LVOT Vmax:   88.60 cm/s LVOT Vmean:  60.800 cm/s LVOT VTI:    0.224 m  AORTA Ao Root diam: 2.80 cm MITRAL VALVE MV Area (PHT): 2.66 cm    SHUNTS MV Decel Time: 285 msec    Systemic VTI:  0.22 m MV E velocity: 99.80 cm/s  Systemic Diam: 1.90 cm MV A velocity: 70.40 cm/s MV E/A ratio:  1.42 Oswaldo Milian MD Electronically signed by Oswaldo Milian MD Signature Date/Time: 03/28/2019/4:42:56 PM    Final      Scheduled Meds:  aspirin  81 mg Oral QHS  atorvastatin  40 mg Oral QHS   Chlorhexidine Gluconate Cloth  6 each Topical Daily   enoxaparin (LOVENOX) injection  30 mg Subcutaneous Q24H   gabapentin  100 mg Oral TID   insulin aspart  0-9 Units Subcutaneous TID WC   isosorbide mononitrate  60 mg Oral Daily   metoprolol tartrate  50 mg Oral BID   pantoprazole  80 mg Oral Daily   senna-docusate  2 tablet Oral Daily   Continuous Infusions:  cefTRIAXone (ROCEPHIN)  IV 2 g (03/28/19 2202)   doxycycline (VIBRAMYCIN) IV 100 mg (03/29/19 1406)   sodium chloride       LOS: 2 days    Time spent: 25 minutes spent in the coordination of care today.    Jonnie Finner, DO [facility]  If 7PM-7AM, please contact night-coverage [email or web address] 03/29/2019, 2:30 PM

## 2019-03-30 LAB — CBC WITH DIFFERENTIAL/PLATELET
Abs Immature Granulocytes: 0.03 10*3/uL (ref 0.00–0.07)
Basophils Absolute: 0 10*3/uL (ref 0.0–0.1)
Basophils Relative: 1 %
Eosinophils Absolute: 0.3 10*3/uL (ref 0.0–0.5)
Eosinophils Relative: 5 %
HCT: 27.5 % — ABNORMAL LOW (ref 36.0–46.0)
Hemoglobin: 8.8 g/dL — ABNORMAL LOW (ref 12.0–15.0)
Immature Granulocytes: 1 %
Lymphocytes Relative: 28 %
Lymphs Abs: 1.4 10*3/uL (ref 0.7–4.0)
MCH: 29.7 pg (ref 26.0–34.0)
MCHC: 32 g/dL (ref 30.0–36.0)
MCV: 92.9 fL (ref 80.0–100.0)
Monocytes Absolute: 0.6 10*3/uL (ref 0.1–1.0)
Monocytes Relative: 13 %
Neutro Abs: 2.6 10*3/uL (ref 1.7–7.7)
Neutrophils Relative %: 52 %
Platelets: 145 10*3/uL — ABNORMAL LOW (ref 150–400)
RBC: 2.96 MIL/uL — ABNORMAL LOW (ref 3.87–5.11)
RDW: 13.1 % (ref 11.5–15.5)
WBC: 5 10*3/uL (ref 4.0–10.5)
nRBC: 0 % (ref 0.0–0.2)

## 2019-03-30 LAB — RENAL FUNCTION PANEL
Albumin: 2.6 g/dL — ABNORMAL LOW (ref 3.5–5.0)
Anion gap: 13 (ref 5–15)
BUN: 70 mg/dL — ABNORMAL HIGH (ref 8–23)
CO2: 19 mmol/L — ABNORMAL LOW (ref 22–32)
Calcium: 9.2 mg/dL (ref 8.9–10.3)
Chloride: 105 mmol/L (ref 98–111)
Creatinine, Ser: 5.17 mg/dL — ABNORMAL HIGH (ref 0.44–1.00)
GFR calc Af Amer: 9 mL/min — ABNORMAL LOW (ref >60)
GFR calc non Af Amer: 8 mL/min — ABNORMAL LOW (ref >60)
Glucose, Bld: 112 mg/dL — ABNORMAL HIGH (ref 70–99)
Phosphorus: 4.4 mg/dL (ref 2.5–4.6)
Potassium: 4.8 mmol/L (ref 3.5–5.1)
Sodium: 137 mmol/L (ref 135–145)

## 2019-03-30 LAB — GLUCOSE, CAPILLARY
Glucose-Capillary: 100 mg/dL — ABNORMAL HIGH (ref 70–99)
Glucose-Capillary: 99 mg/dL (ref 70–99)
Glucose-Capillary: 134 mg/dL — ABNORMAL HIGH (ref 70–99)

## 2019-03-30 MED ORDER — DOXYCYCLINE HYCLATE 100 MG PO TABS
100.0000 mg | ORAL_TABLET | Freq: Two times a day (BID) | ORAL | Status: DC
  Administered 2019-03-30: 100 mg via ORAL
  Filled 2019-03-30: qty 1

## 2019-03-30 NOTE — Discharge Summary (Signed)
. Physician Discharge Summary  Dana Jackson ALP:[id number] DOB: 1945-04-07 DOA: 03/27/2019  PCP: Heywood Bene, PA-C  Admit date: 03/27/2019 Discharge date: 03/30/2019  Admitted From: Home Disposition:  Discharged to Home  Recommendations for Outpatient Follow-up:  1. Follow up with PCP in 5 - 7 days. 2. Please obtain BMP/CBC in one week  Discharge Condition: Stable  CODE STATUS: FULL   Brief/Interim Summary: Dana Jackson a 74 y.o.femalewith medical history significant ofCKD stage 5 not yet on dialysis, DM2, HTN, CAD s/p CABG. Pt with 4-5 day h/o cough per husband. Yesterday patient had dental cleaning. Today patient with confusion, AMS this morning. LKW 2100 last evening.Woke up disoriented to place and situation, ordinarily AAOx4. Had completed COVID vaccines, (1/22 and 2/12).  03/30/19: Afebrile ON. UCx w/ multiple species, suggesting recollection. However, she's been on abx at this point. Other Cx is neg to date. She looks good this AM. However, renal fxn was going up. Asked Nephro to review. She is ok to d/c with follow up with nephrology. Appreciate their assistance. Have updated husband with plan. She will d/c to home today.   Discharge Diagnoses:  Principal Problem:   Acute encephalopathy Active Problems:   Essential hypertension   Type 2 diabetes mellitus with renal manifestations, controlled (HCC)   CAD (coronary artery disease) of artery bypass graft   CKD (chronic kidney disease) stage 5, GFR less than 15 ml/min (HCC)   Fever  Acute encephalopathy, etiology unknown FUO - Bld Cx NTD - UCx w/ multiple species - CT head negative - CXR w/ small pleural effusion - fevers ON, but WBC ok.  - has been on rocephin, will continue for now; not sure of source - 3/18: last dose of rocephin tonight, mentation is a little bit better today, but per nursing she is still having intermittent confusion. After speaking with husband, he  says this is about normal for her     - 3/19: mentation is at baseline, she is afebrile, white count is good; etiology for all of this is unknown. Will hold abx at discharge. She needs to follow up with PCP in 5 - 7 days.   DM2 - on glypizide at home - continue SSI for now - DM diet     - 3/19: resume home meds at discharge  HTN - only getting metoprolol from her home regimen; other meds held - BP is ok for now  CAD/Hx of CABG - continue statin, ASA, metoprolol  AKI on CKD V - Care Everywhere shows baseline SCr 4.31 - 3/18: Her Scr has bumped up to 5 and BUN is up; intermittent confusion, spoke with nephro; will hold lasix and add a 500cc bolus. If renal function does not flatten, will consult renal formally in AM     - 3/19: Scr still up. Nephro reviewed. She is ok to d/c from their standpoint with nephro follow up; appreciate their assistance  Discharge Instructions   Allergies as of 03/30/2019      Reactions   Sulfa Antibiotics Other (See Comments)   "AKI"   Erythromycin Itching   Pravastatin Other (See Comments)   Used 01/2011 to 03/2011 (reaction unknown)   Simvastatin Other (See Comments)   Reaction not known   Codeine Nausea Only, Other (See Comments)   Reaction not known, but can tolerate Hycodan   Gemfibrozil Itching      Medication List    STOP taking these medications   furosemide 20 MG tablet Commonly known as: LASIX  TAKE these medications   Align 4 MG Caps Take 4 mg by mouth daily.   Alpha-Lipoic Acid 600 MG Caps Take 600 mg by mouth daily.   aspirin 81 MG tablet Take 81 mg by mouth at bedtime.   atorvastatin 40 MG tablet Commonly known as: LIPITOR Take 40 mg by mouth at bedtime.   calcitRIOL 0.25 MCG capsule Commonly known as: ROCALTROL Take 0.25 mcg by mouth daily with breakfast.   famotidine 20 MG tablet Commonly known as: PEPCID Take 40 mg by mouth daily before breakfast.   Fish Oil 1200 MG  Caps Take 1,200 mg by mouth in the morning and at bedtime.   gabapentin 100 MG capsule Commonly known as: NEURONTIN Take 100 mg by mouth 3 (three) times daily.   glipiZIDE 5 MG tablet Commonly known as: GLUCOTROL Take 2.5 mg by mouth 2 (two) times daily before a meal.   HYDROcodone-homatropine 5-1.5 MG/5ML syrup Commonly known as: HYCODAN Take 5 mLs by mouth every 6 (six) hours as needed for cough.   isosorbide mononitrate 60 MG 24 hr tablet Commonly known as: IMDUR Take 1 tablet (60 mg total) by mouth daily.   loperamide 2 MG capsule Commonly known as: IMODIUM Take 2 mg by mouth 4 (four) times daily as needed for diarrhea or loose stools.   loratadine 10 MG tablet Commonly known as: CLARITIN Take 10 mg by mouth daily.   magnesium oxide 400 MG tablet Commonly known as: MAG-OX Take 400 mg by mouth daily.   metoprolol tartrate 50 MG tablet Commonly known as: LOPRESSOR Take 1 tablet (50 mg total) by mouth 2 (two) times daily.   nitroGLYCERIN 0.4 MG SL tablet Commonly known as: NITROSTAT PLACE 1 TABLET (0.4 MG TOTAL) UNDER THE TONGUE EVERY 5 (FIVE) MINUTES AS NEEDED FOR CHEST PAIN. What changed: See the new instructions.   omeprazole 40 MG capsule Commonly known as: PRILOSEC Take 40 mg by mouth daily before breakfast.   ProAir HFA 108 (90 Base) MCG/ACT inhaler Generic drug: albuterol Inhale 2 puffs into the lungs every 6 (six) hours as needed for wheezing or shortness of breath.   promethazine 25 MG tablet Commonly known as: PHENERGAN Take 25 mg by mouth every 6 (six) hours as needed for nausea or vomiting.   pyridOXINE 100 MG tablet Commonly known as: VITAMIN B-6 Take 200 mg by mouth daily.       Allergies  Allergen Reactions  . Sulfa Antibiotics Other (See Comments)    "AKI"   . Erythromycin Itching  . Pravastatin Other (See Comments)    Used 01/2011 to 03/2011 (reaction unknown)  . Simvastatin Other (See Comments)    Reaction not known  . Codeine  Nausea Only and Other (See Comments)    Reaction not known, but can tolerate Hycodan  . Gemfibrozil Itching    Consultations:  Nephrology   Procedures/Studies: DG Abd 1 View  Result Date: 03/29/2019 CLINICAL DATA:  Constipation 4 days. EXAM: ABDOMEN - 1 VIEW COMPARISON:  11/24/2008 FINDINGS: Bowel gas pattern is nonobstructive. Mild to moderate fecal retention over the right colon. There are surgical clips over the right upper quadrant. No free peritoneal air. Moderate curvature of the lumbar spine convex left with significant degenerative changes. Mild degenerate change of the hips. IMPRESSION: Nonobstructive bowel gas pattern with mild to moderate fecal retention over the right colon. Electronically Signed   By: Marin Olp M.D.   On: 03/29/2019 13:33   CT Head Wo Contrast  Result Date: 03/27/2019 CLINICAL  DATA:  Altered mental status. EXAM: CT HEAD WITHOUT CONTRAST TECHNIQUE: Contiguous axial images were obtained from the base of the skull through the vertex without intravenous contrast. COMPARISON:  November 22, 2015 FINDINGS: Brain: No evidence of acute infarction, hemorrhage, hydrocephalus, extra-axial collection or mass lesion/mass effect. Atrophy and chronic microvascular ischemic changes are noted. Vascular: No hyperdense vessel or unexpected calcification. Skull: Normal. Negative for fracture or focal lesion. Sinuses/Orbits: No acute finding. Other: None. IMPRESSION: 1. No acute intracranial abnormality. 2. Atrophy and chronic microvascular ischemic changes are noted. Electronically Signed   By: Constance Holster M.D.   On: 03/27/2019 23:32   US RENAL  Result Date: 03/28/2019 CLINICAL DATA:  74 year old female with history of renal carcinoma and partial left nephrectomy. Evaluate for hydronephrosis. EXAM: RENAL / URINARY TRACT ULTRASOUND COMPLETE COMPARISON:  Abdominal MRI dated 09/20/2018 and renal ultrasound dated 10/15/2015. FINDINGS: Right Kidney: Renal measurements: 10.2 x 5.3  x 4.3 cm = volume: 120 mL. There is diffuse increased renal parenchymal echogenicity. No hydronephrosis or shadowing stone. There is a 4 cm inferior pole cyst. Left Kidney: Renal measurements: 10.6 x 4.9 x 5.1 cm = volume: 137 mL. Mild increased echogenicity. No hydronephrosis or shadowing stone. There is a 2.5 cm upper pole cyst. Bladder: Appears normal for degree of bladder distention. Other: None. IMPRESSION: 1. Mild increased echogenicity likely related to underlying medical renal disease. 2. No hydronephrosis or shadowing stone. Electronically Signed   By: Anner Crete M.D.   On: 03/28/2019 03:23   DG Chest Port 1 View  Result Date: 03/27/2019 CLINICAL DATA:  Chest pain and shortness of breath. EXAM: PORTABLE CHEST 1 VIEW COMPARISON:  June 13, 2017 FINDINGS: There is stable right-sided venous Port-A-Cath positioning. Multiple sternal wires and vascular clips are noted. These are seen on the prior study. The heart size and mediastinal contours are within normal limits. There is no evidence of acute infiltrate or pneumothorax. Mild blunting of the left costophrenic angle is seen. A radiopaque fusion plate and screws are seen overlying the cervical spine. The visualized skeletal structures are otherwise unremarkable. Radiopaque surgical clips are again seen overlying the right upper quadrant. IMPRESSION: 1. Small left pleural effusion. 2. Evidence of prior median sternotomy/CABG. Electronically Signed   By: Virgina Norfolk M.D.   On: 03/27/2019 17:33   ECHOCARDIOGRAM COMPLETE  Result Date: 03/28/2019    ECHOCARDIOGRAM REPORT   Patient Name:   Dana Jackson Date of Exam: 03/28/2019 Medical Rec #:  [id number]      Height:       60.0 in Accession #:    [id number]     Weight:       200.0 lb Date of Birth:  September 23, 1945     BSA:          1.867 m Patient Age:    85 years       BP:           107/63 mmHg Patient Gender: F              HR:           60 bpm. Exam Location:  Inpatient Procedure: 2D Echo  Indications:    murmur 785.2 fever 780.6  History:        Patient has prior history of Echocardiogram examinations, most                 recent 12/08/2010.  Sonographer:    Jannett Celestine RDCS (AE) Referring Phys: [place]  Sonographer Comments: restricted mobility IMPRESSIONS  1. Left ventricular ejection fraction, by estimation, is 65 to 70%. The left ventricle has normal function. The left ventricle has no regional wall motion abnormalities. There is mild left ventricular hypertrophy. Left ventricular diastolic parameters are indeterminate.  2. Right ventricule is not well visualized but systolic function appears grossly normal. The right ventricular size is normal. Tricuspid regurgitation signal is inadequate for assessing PA pressure.  3. The mitral valve is abnormal. Moderate mitral annular calcification. Trivial mitral valve regurgitation.  4. The aortic valve is tricuspid. Aortic valve regurgitation is trivial. Mild to moderate aortic valve sclerosis/calcification is present, without any evidence of aortic stenosis.  5. Aortic dilatation noted. 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  6. The inferior vena cava is normal in size with greater than 50% respiratory variability, suggesting right atrial pressure of 3 mmHg. FINDINGS  Left Ventricle: Left ventricular ejection fraction, by estimation, is 65 to 70%. The left ventricle has normal function. The left ventricle has no regional wall motion abnormalities. The left ventricular internal cavity size was normal in size. There is  mild left ventricular hypertrophy. Left ventricular diastolic parameters are indeterminate. Right Ventricle: The right ventricular size is normal. No increase in right ventricular wall thickness. Right ventricular systolic function is normal. Tricuspid regurgitation signal is inadequate for assessing PA pressure. Left Atrium: Left atrial size was normal in size. Right Atrium: Right atrial size was  not well visualized. Pericardium: Trivial pericardial effusion is present. Mitral Valve: The mitral valve is abnormal. Moderate mitral annular calcification. Trivial mitral valve regurgitation. Tricuspid Valve: The tricuspid valve is normal in structure. Tricuspid valve regurgitation is mild. Aortic Valve: The aortic valve is tricuspid. Aortic valve regurgitation is trivial. Mild to moderate aortic valve sclerosis/calcification is present, without any evidence of aortic stenosis. Pulmonic Valve: The pulmonic valve was not well visualized. Pulmonic valve regurgitation is trivial. Aorta: The aortic root is normal in size and structure and aortic dilatation noted. 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 Venous: The inferior vena cava is normal in size with greater than 50% respiratory variability, suggesting right atrial pressure of 3 mmHg. IAS/Shunts: No atrial level shunt detected by color flow Doppler.  LEFT VENTRICLE PLAX 2D LVIDd:         3.80 cm  Diastology LVIDs:         1.91 cm  LV e' lateral:   9.03 cm/s LV PW:         1.12 cm  LV E/e' lateral: 11.1 LV IVS:        1.07 cm  LV e' medial:    6.20 cm/s LVOT diam:     1.90 cm  LV E/e' medial:  16.1 LV SV:         64 LV SV Index:   34 LVOT Area:     2.84 cm  LEFT ATRIUM             Index LA diam:        3.30 cm 1.77 cm/m LA Vol (A2C):   38.5 ml 20.63 ml/m LA Vol (A4C):   46.2 ml 24.75 ml/m LA Biplane Vol: 43.9 ml 23.52 ml/m  AORTIC VALVE LVOT Vmax:   88.60 cm/s LVOT Vmean:  60.800 cm/s LVOT VTI:    0.224 m  AORTA Ao Root diam: 2.80 cm MITRAL VALVE MV Area (PHT): 2.66 cm    SHUNTS MV Decel Time: 285 msec  Systemic VTI:  0.22 m MV E velocity: 99.80 cm/s  Systemic Diam: 1.90 cm MV A velocity: 70.40 cm/s MV E/A ratio:  1.42 Oswaldo Milian MD Electronically signed by Oswaldo Milian MD Signature Date/Time: 03/28/2019/4:42:56 PM    Final       Subjective: "I feel fine"  Discharge Exam: Vitals:   03/30/19 0357 03/30/19  0748  BP: 135/77 (!) 118/44  Pulse: 63 62  Resp: 11 18  Temp: 98.1 F (36.7 C) 97.7 F (36.5 C)  SpO2: 99% 97%   Vitals:   03/29/19 2102 03/29/19 2315 03/30/19 0357 03/30/19 0748  BP: (!) 117/59 (!) 146/64 135/77 (!) 118/44  Pulse: 69 77 63 62  Resp: 14 18 11 18   Temp: 97.8 F (36.6 C) 98 F (36.7 C) 98.1 F (36.7 C) 97.7 F (36.5 C)  TempSrc: Oral Oral Oral   SpO2: 100% 100% 99% 97%  Weight:      Height:        General: 74 y.o. female resting in bed in NAD Cardiovascular: RRR, +S1, S2, no m/g/r Respiratory: CTABL, no w/r/r, normal WOB GI: BS+, NDNT, no masses noted, no organomegaly noted MSK: No e/c/c Neuro: A&O x 3, no focal deficits Psyc: Appropriate interaction and affect, calm/cooperative   The results of significant diagnostics from this hospitalization (including imaging, microbiology, ancillary and laboratory) are listed below for reference.     Microbiology: Recent Results (from the past 240 hour(s))  Blood Culture (routine x 2)     Status: None (Preliminary result)   Collection Time: 03/27/19  5:27 PM   Specimen: BLOOD  Result Value Ref Range Status   Specimen Description BLOOD RIGHT ANTECUBITAL  Final   Special Requests   Final    BOTTLES DRAWN AEROBIC AND ANAEROBIC Blood Culture results may not be optimal due to an inadequate volume of blood received in culture bottles   Culture   Final    NO GROWTH 3 DAYS Performed at [facility], [place] [address], [place], [place] [address]    Report Status PENDING  Incomplete  Urine culture     Status: Abnormal   Collection Time: 03/27/19  5:34 PM   Specimen: In/Out Cath Urine  Result Value Ref Range Status   Specimen Description IN/OUT CATH URINE  Final   Special Requests   Final    NONE Performed at [facility], [place] [address], [place], [place] [address]    Culture MULTIPLE SPECIES PRESENT, SUGGEST RECOLLECTION (A)  Final   Report Status 03/28/2019 FINAL  Final  SARS CORONAVIRUS 2 (TAT 6-24  HRS) Nasopharyngeal Nasopharyngeal Swab     Status: None   Collection Time: 03/27/19  7:22 PM   Specimen: Nasopharyngeal Swab  Result Value Ref Range Status   SARS Coronavirus 2 NEGATIVE NEGATIVE Final    Comment: (NOTE) SARS-CoV-2 target nucleic acids are NOT DETECTED. The SARS-CoV-2 RNA is generally detectable in upper and lower respiratory specimens during the acute phase of infection. Negative results do not preclude SARS-CoV-2 infection, do not rule out co-infections with other pathogens, and should not be used as the sole basis for treatment or other patient management decisions. Negative results must be combined with clinical observations, patient history, and epidemiological information. The expected result is Negative. Fact Sheet for Patients: SugarRoll.be Fact Sheet for Healthcare Providers: https://www.woods-mathews.com/ This test is not yet approved or cleared by the [place] FDA and  has been authorized for detection and/or diagnosis of SARS-CoV-2 by FDA under an  Emergency Use Authorization (EUA). This EUA will remain  in effect (meaning this test can be used) for the duration of the COVID-19 declaration under Section 56 4(b)(1) of the Act, 21 U.S.C. section 360bbb-3(b)(1), unless the authorization is terminated or revoked sooner. Performed at [facility], [place] [address], [place], [place] [address]   MRSA PCR Screening     Status: None   Collection Time: 03/27/19  8:35 PM   Specimen: Nasal Mucosa; Nasopharyngeal  Result Value Ref Range Status   MRSA by PCR NEGATIVE NEGATIVE Final    Comment:        The GeneXpert MRSA Assay (FDA approved for NASAL specimens only), is one component of a comprehensive MRSA colonization surveillance program. It is not intended to diagnose MRSA infection nor to guide or monitor treatment for MRSA infections. Performed at [facility], [place] [address] [place], [place]  [address]   Blood Culture (routine x 2)     Status: None (Preliminary result)   Collection Time: 03/28/19  5:00 AM   Specimen: BLOOD RIGHT HAND  Result Value Ref Range Status   Specimen Description BLOOD RIGHT HAND  Final   Special Requests   Final    BOTTLES DRAWN AEROBIC ONLY Blood Culture results may not be optimal due to an inadequate volume of blood received in culture bottles   Culture   Final    NO GROWTH 2 DAYS Performed at [facility], [place] [address], [place], [place] [address]    Report Status PENDING  Incomplete     Labs: BNP (last 3 results) No results for input(s): BNP in the last 8760 hours. Basic Metabolic Panel: Recent Labs  Lab 03/27/19 1727 03/28/19 0436 03/29/19 1245 03/30/19 0718  NA 136 140 136 137  K 4.7 4.4 5.1 4.8  CL 103 102 103 105  CO2 24 22 22  19*  GLUCOSE 116* 141* 153* 112*  BUN 46* 50* 61* 70*  CREATININE 4.14* 4.58* 5.02* 5.17*  CALCIUM 9.6 9.4 9.6 9.2  MG  --  2.3  --   --   PHOS  --   --   --  4.4   Liver Function Tests: Recent Labs  Lab 03/27/19 1727 03/28/19 0436 03/29/19 1245 03/30/19 0718  AST 25 41 34  --   ALT 30 42 39  --   ALKPHOS 89 82 72  --   BILITOT 1.1 0.8 0.5  --   PROT 6.2* 5.6* 5.5*  --   ALBUMIN 3.2* 2.7* 2.7* 2.6*   No results for input(s): LIPASE, AMYLASE in the last 168 hours. No results for input(s): AMMONIA in the last 168 hours. CBC: Recent Labs  Lab 03/27/19 1727 03/28/19 0436 03/29/19 1245 03/30/19 0718  WBC 7.6 10.2 7.6 5.0  NEUTROABS 6.5  --  5.2 2.6  HGB 11.4* 10.8* 10.0* 8.8*  HCT 35.3* 34.3* 31.0* 27.5*  MCV 93.4 95.8 94.2 92.9  PLT 160 137* 169 145*   Cardiac Enzymes: No results for input(s): CKTOTAL, CKMB, CKMBINDEX, TROPONINI in the last 168 hours. BNP: Invalid input(s): POCBNP CBG: Recent Labs  Lab 03/29/19 1203 03/29/19 1629 03/29/19 2149 03/30/19 0749 03/30/19 1159  GLUCAP 150* 91 126* 100* 99   D-Dimer No results for input(s): DDIMER in the last 72 hours. Hgb  A1c Recent Labs    03/27/19 2035  HGBA1C 5.7*   Lipid Profile No results for input(s): CHOL, HDL, LDLCALC, TRIG, CHOLHDL, LDLDIRECT in the last 72 hours. Thyroid function  studies No results for input(s): TSH, T4TOTAL, T3FREE, THYROIDAB in the last 72 hours.  Invalid input(s): FREET3 Anemia work up No results for input(s): VITAMINB12, FOLATE, FERRITIN, TIBC, IRON, RETICCTPCT in the last 72 hours. Urinalysis    Component Value Date/Time   COLORURINE YELLOW 03/27/2019 1735   APPEARANCEUR CLEAR 03/27/2019 1735   LABSPEC 1.012 03/27/2019 1735   PHURINE 7.0 03/27/2019 1735   GLUCOSEU NEGATIVE 03/27/2019 1735   HGBUR LARGE (A) 03/27/2019 1735   BILIRUBINUR NEGATIVE 03/27/2019 1735   KETONESUR NEGATIVE 03/27/2019 1735   PROTEINUR 100 (A) 03/27/2019 1735   UROBILINOGEN 0.2 06/10/2014 1259   NITRITE NEGATIVE 03/27/2019 1735   LEUKOCYTESUR TRACE (A) 03/27/2019 1735   Sepsis Labs Invalid input(s): PROCALCITONIN,  WBC,  LACTICIDVEN Microbiology Recent Results (from the past 240 hour(s))  Blood Culture (routine x 2)     Status: None (Preliminary result)   Collection Time: 03/27/19  5:27 PM   Specimen: BLOOD  Result Value Ref Range Status   Specimen Description BLOOD RIGHT ANTECUBITAL  Final   Special Requests   Final    BOTTLES DRAWN AEROBIC AND ANAEROBIC Blood Culture results may not be optimal due to an inadequate volume of blood received in culture bottles   Culture   Final    NO GROWTH 3 DAYS Performed at [facility], [place] [address] [place], [place] [address]    Report Status PENDING  Incomplete  Urine culture     Status: Abnormal   Collection Time: 03/27/19  5:34 PM   Specimen: In/Out Cath Urine  Result Value Ref Range Status   Specimen Description IN/OUT CATH URINE  Final   Special Requests   Final    NONE Performed at [facility], [place] [address] [place], [place] [address]    Culture MULTIPLE SPECIES PRESENT, SUGGEST RECOLLECTION (A)  Final   Report  Status 03/28/2019 FINAL  Final  SARS CORONAVIRUS 2 (TAT 6-24 HRS) Nasopharyngeal Nasopharyngeal Swab     Status: None   Collection Time: 03/27/19  7:22 PM   Specimen: Nasopharyngeal Swab  Result Value Ref Range Status   SARS Coronavirus 2 NEGATIVE NEGATIVE Final    Comment: (NOTE) SARS-CoV-2 target nucleic acids are NOT DETECTED. The SARS-CoV-2 RNA is generally detectable in upper and lower respiratory specimens during the acute phase of infection. Negative results do not preclude SARS-CoV-2 infection, do not rule out co-infections with other pathogens, and should not be used as the sole basis for treatment or other patient management decisions. Negative results must be combined with clinical observations, patient history, and epidemiological information. The expected result is Negative. Fact Sheet for Patients: SugarRoll.be Fact Sheet for Healthcare Providers: https://www.woods-mathews.com/ This test is not yet approved or cleared by the [place] FDA and  has been authorized for detection and/or diagnosis of SARS-CoV-2 by FDA under an Emergency Use Authorization (EUA). This EUA will remain  in effect (meaning this test can be used) for the duration of the COVID-19 declaration under Section 56 4(b)(1) of the Act, 21 U.S.C. section 360bbb-3(b)(1), unless the authorization is terminated or revoked sooner. Performed at [facility], [place] [address] [place], [place] [address]   MRSA PCR Screening     Status: None   Collection Time: 03/27/19  8:35 PM   Specimen: Nasal Mucosa; Nasopharyngeal  Result Value Ref Range Status   MRSA by PCR NEGATIVE NEGATIVE Final    Comment:        The GeneXpert MRSA Assay (FDA  approved for NASAL specimens only), is one component of a comprehensive MRSA colonization surveillance program. It is not intended to diagnose MRSA infection nor to guide or monitor treatment for MRSA infections. Performed  at [facility], [place] [address], [place], [place] [address]   Blood Culture (routine x 2)     Status: None (Preliminary result)   Collection Time: 03/28/19  5:00 AM   Specimen: BLOOD RIGHT HAND  Result Value Ref Range Status   Specimen Description BLOOD RIGHT HAND  Final   Special Requests   Final    BOTTLES DRAWN AEROBIC ONLY Blood Culture results may not be optimal due to an inadequate volume of blood received in culture bottles   Culture   Final    NO GROWTH 2 DAYS Performed at [facility], [place] [address] [place], [place] [address]    Report Status PENDING  Incomplete     Time coordinating discharge: 35 minutes  SIGNED:   Jonnie Finner, DO  [facility] 03/30/2019, 3:32 PM   If 7PM-7AM, please contact night-coverage [email or web address]

## 2019-03-30 NOTE — Progress Notes (Signed)
PHARMACIST - PHYSICIAN COMMUNICATION DR:   Marylyn Ishihara CONCERNING: Antibiotic IV to Oral Route Change Policy  RECOMMENDATION: This patient is receiving doxycycline by the intravenous route.  Based on criteria approved by the Pharmacy and Therapeutics Committee, the antibiotic(s) is/are being converted to the equivalent oral dose form(s).   DESCRIPTION: These criteria include:  Patient being treated for a respiratory tract infection, urinary tract infection, cellulitis or clostridium difficile associated diarrhea if on metronidazole  The patient is not neutropenic and does not exhibit a GI malabsorption state  The patient is eating (either orally or via tube) and/or has been taking other orally administered medications for a least 24 hours  The patient is improving clinically and has a Tmax < 100.5  If you have questions about this conversion, please contact the Pharmacy Department  []   [phone] )  [facility] []   [phone] )  [facility] [x]   [phone] )  [facility] []   [phone] )  [facility] []   [phone] )  [facility]

## 2019-03-30 NOTE — Progress Notes (Signed)
Patient was stable at discharge. RN removed pt's IV. We reviewed the discharge education. Patient and husband verbalized understanding and had no further questions. Patient left with belongings in hand.

## 2019-03-30 NOTE — Consult Note (Signed)
[facility]  HISTORY AND PHYSICAL  Dana Jackson is an 74 y.o. female.    Chief Complaint: acute encephalopathy  HPI: Pt is a73F with a PMH sig for HTN, HLD, CAD, DM, endometrial cancer, and CKD V followed by Dr. Joelyn Oms who is now seen in consultation at the request of Dr. Marylyn Ishihara for eval and recs re: CKD with mild AKI on CKD.  Pt presented to the hospital 3/16 with acute encephalopathy and cough for 4-5 days.  Was treated for UTI/ PNA/ bacteremia but it looks like all cultures and workup have been negative.  Has been afebrile overnight, was initially on CTX and doxy which have been completed.  Cr was 4.14 on admission, now 5.15.  She is back to her baseline mental status and feels well.  Cr was 5.02 yesterday.  Had been restarted on her home Lasix of 20 mg daily which was stopped yesterday.  In this setting we are asked to see.  Pt is feeling well and is sitting up in the chair.  Feels well.  Husband at bedside and corroborates baseline MS.  She follows with Dr. Joelyn Oms and is preparing for HHD.  Has a mature AVF.    No more f/c, n/v, SOB, CP, LE edema.  No dysgeusia, anorexia, hiccups, jerking movements.  Has appt with Dr. Joelyn Oms 4/1.      PMH: Past Medical History:  Diagnosis Date  . Anemia   . Arthritis    hands  . Asthma    childhood  . Bacteremia due to Escherichia coli June 2015    Currently being treated with anti-biotics  . CAD in native artery 12/2010   a) Cath for exertional angina & EKG changes: 40% LM, 80% mid LAD.  95% ost cX, 80-90% PDA --> CABG X3; b) Post CABG CATH for + Myoview with basal anterior ischemia -> Ost LAD 80% (diffuse) then 100% after SP2, RI 70% (too small for PCI), Ost-prox Cx 99% & OM1 90%, OstrPDA 80% (small); Occluded SVG-rPDA.  Patent LIMA-dLAD, SVG-OM: culprit ~ p-m LAD pre-LIMA & RI - not good PCI target --> Med Rx  . CKD (chronic kidney disease) stage 3, GFR 30-59 ml/min   . Degenerative disc disease, cervical    Degenerative disc  disease, cervical [722.4]  . Diabetes mellitus without complication ([place])    Type II  . Endometrial cancer [facility]) November 2015   Treated with TAH with pelvic lymphadenectomy followed by radiation and chemotherapy  . GERD (gastroesophageal reflux disease)   . Hearing loss    bilateral - no hearing aids  . Heart murmur   . History of asthma     childhood  . History of blood transfusion   . History of pneumonia    "2-3 times"  . History of unstable angina November/December 2012   T wave inversions in inferolateral leads.  No stress test performed.  . Hyperlipidemia LDL goal <70   . Hypertension, essential, benign   . Neuropathy    hands  . Pneumonia 2018  . Renal cell carcinoma ([place]) 11/18/2008   T2aNx s/p partial left nephrectomy  . 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 Stroke [facility])    TIA- no residual   . TIA (transient ischemic attack) 1992 &  2010   PSH: Past Surgical History:  Procedure Laterality Date  . ANTERIOR CERVICAL DECOMP/DISCECTOMY FUSION  10/11/2005   multi-level  . AV FISTULA PLACEMENT Left 08/03/2017   Procedure: ARTERIOVENOUS (  AV) FISTULA CREATION LEFT ARM;  Surgeon: Rosetta Posner, MD;  Location: [facility];  Service: Vascular;  Laterality: Left;  . CARDIAC CATHETERIZATION  12/24/10   40% left main, 80% mid LAD, 95% ostial circumflex, 80-90% PDA.  [name] CARDIAC CATHETERIZATION N/A 07/01/2014   Procedure: Left Heart Cath and Cors/Grafts Angiography;  Surgeon: Leonie Man, MD;  Location[facility] INVASIVE CV LAB:  For Abn Nuc @ [facility]: Ost LAD 80%, mid LAD 100% after S/P 2, 70% RI (no PCI target), ost-prox Cx 99%, OM1 90%. Ost rPDA 80% (~ pre-CABG), occluded SVG-rPDA, patent LIMA-dLAD, SVG OM; potential culprit for abn Nuc scan = pLAD Dz, small RI 70% or PDA -> med Rx  . CAROTID ENDARTERECTOMY Left   . CHOLECYSTECTOMY  ~ 1971  . COLONOSCOPY    . CORONARY ARTERY BYPASS GRAFT  12/25/2010   Procedure: CORONARY ARTERY BYPASS GRAFTING (CABGX3 - LIMA-LAD,  SVG RPL, SVG-Circumflex);  Surgeon: Rexene Alberts, MD;  Location: [facility];  Service: Open Heart Surgery;  Laterality: N/A;  . DILATATION & CURETTAGE/HYSTEROSCOPY WITH MYOSURE N/A 11/02/2013   Procedure: DILATATION & CURETTAGE/HYSTEROSCOPY WITH MYOSURE ABLATION;  Surgeon: Allena Katz, MD;  Location: [facility];  Service: Gynecology;  Laterality: N/A;  . DOPPLER ECHOCARDIOGRAPHY  12/08/2010   EF =>55%,MILD CONCENTRIC LEFT VENTRICULAR HYPERTROPHY  . EYE SURGERY Bilateral    Cataract  . LEFT HEART CATHETERIZATION WITH CORONARY ANGIOGRAM N/A 12/24/2010   Procedure: LEFT HEART CATHETERIZATION WITH CORONARY ANGIOGRAM;  Surgeon: Leonie Man, MD;  Location: [facility] CATH LAB;  Service: Cardiovascular;  Laterality: N/A;  . NM MYOCAR SINGLE W/SPECT  07/26/2007   EF 79%, LEFT VENT.FUNCTION NORMAL  . PARTIAL NEPHRECTOMY Left 11/18/2008   left partial nephrectomy for renal cell CA  . PET Myocardial Perfusion Scan  06/13/2014   At [place]: Moderate size, mild severity completely reversible defect involving the basal anterior, mid anterior and apical anterior segments consistent with ischemia. EF 65% with normal global function.  . TONSILLECTOMY     "in college"  . TOTAL ABDOMINAL HYSTERECTOMY  November 2015    At [facility]: Robotic procedure with pelvic lymphadenectomy  . TRANSTHORACIC ECHOCARDIOGRAM  06/13/2014   At [place]: EF 60-65%. Grade 1 diastolic dysfunction. Mild MR. Aortic sclerosis. Moderate pulmonary hypertension  . TUBAL LIGATION  ~ 1984  . UPPER GI ENDOSCOPY    . WISDOM TOOTH EXTRACTION      Past Medical History:  Diagnosis Date  . Anemia   . Arthritis    hands  . Asthma    childhood  . Bacteremia due to Escherichia coli June 2015    Currently being treated with anti-biotics  . CAD in native artery 12/2010   a) Cath for exertional angina & EKG changes: 40% LM, 80% mid LAD.  95% ost cX, 80-90% PDA --> CABG X3; b) Post CABG CATH for + Myoview with basal anterior  ischemia -> Ost LAD 80% (diffuse) then 100% after SP2, RI 70% (too small for PCI), Ost-prox Cx 99% & OM1 90%, OstrPDA 80% (small); Occluded SVG-rPDA.  Patent LIMA-dLAD, SVG-OM: culprit ~ p-m LAD pre-LIMA & RI - not good PCI target --> Med Rx  . CKD (chronic kidney disease) stage 3, GFR 30-59 ml/min   . Degenerative disc disease, cervical    Degenerative disc disease, cervical [722.4]  . Diabetes mellitus without complication ([place])    Type II  . Endometrial cancer [facility]) November 2015   Treated with TAH with pelvic lymphadenectomy followed  by radiation and chemotherapy  . GERD (gastroesophageal reflux disease)   . Hearing loss    bilateral - no hearing aids  . Heart murmur   . History of asthma     childhood  . History of blood transfusion   . History of pneumonia    "2-3 times"  . History of unstable angina November/December 2012   T wave inversions in inferolateral leads.  No stress test performed.  . Hyperlipidemia LDL goal <70   . Hypertension, essential, benign   . Neuropathy    hands  . Pneumonia 2018  . Renal cell carcinoma ([place]) 11/18/2008   T2aNx s/p partial left nephrectomy  . 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 Stroke [facility])    TIA- no residual   . TIA (transient ischemic attack) 1992 &  2010    Medications:   Scheduled: . aspirin  81 mg Oral QHS  . atorvastatin  40 mg Oral QHS  . Chlorhexidine Gluconate Cloth  6 each Topical Daily  . enoxaparin (LOVENOX) injection  30 mg Subcutaneous Q24H  . gabapentin  100 mg Oral TID  . insulin aspart  0-9 Units Subcutaneous TID WC  . isosorbide mononitrate  60 mg Oral Daily  . metoprolol tartrate  50 mg Oral BID  . pantoprazole  80 mg Oral Daily  . senna-docusate  2 tablet Oral Daily    Medications Prior to Admission  Medication Sig Dispense Refill  . Alpha-Lipoic Acid 600 MG CAPS Take 600 mg by mouth daily.    [name] aspirin 81 MG tablet Take 81 mg by mouth at bedtime.     [name] atorvastatin  (LIPITOR) 40 MG tablet Take 40 mg by mouth at bedtime.     . calcitRIOL (ROCALTROL) 0.25 MCG capsule Take 0.25 mcg by mouth daily with breakfast.    . famotidine (PEPCID) 20 MG tablet Take 40 mg by mouth daily before breakfast.     . furosemide (LASIX) 20 MG tablet Take 20 mg by mouth at bedtime.   1  . gabapentin (NEURONTIN) 100 MG capsule Take 100 mg by mouth 3 (three) times daily.     [name] glipiZIDE (GLUCOTROL) 5 MG tablet Take 2.5 mg by mouth 2 (two) times daily before a meal.     . HYDROcodone-homatropine (HYCODAN) 5-1.5 MG/5ML syrup Take 5 mLs by mouth every 6 (six) hours as needed for cough.     . isosorbide mononitrate (IMDUR) 60 MG 24 hr tablet Take 1 tablet (60 mg total) by mouth daily. 90 tablet 3  . loperamide (IMODIUM) 2 MG capsule Take 2 mg by mouth 4 (four) times daily as needed for diarrhea or loose stools.    [name] loratadine (CLARITIN) 10 MG tablet Take 10 mg by mouth daily.    . magnesium oxide (MAG-OX) 400 MG tablet Take 400 mg by mouth daily.    . metoprolol tartrate (LOPRESSOR) 50 MG tablet Take 1 tablet (50 mg total) by mouth 2 (two) times daily. 180 tablet 3  . nitroGLYCERIN (NITROSTAT) 0.4 MG SL tablet PLACE 1 TABLET (0.4 MG TOTAL) UNDER THE TONGUE EVERY 5 (FIVE) MINUTES AS NEEDED FOR CHEST PAIN. (Patient taking differently: Place 0.4 mg under the tongue every 5 (five) minutes as needed for chest pain. ) 25 tablet 3  . Omega-3 Fatty Acids (FISH OIL) 1200 MG CAPS Take 1,200 mg by mouth in the morning and at bedtime.     [name] omeprazole (PRILOSEC) 40 MG capsule  Take 40 mg by mouth daily before breakfast.     . PROAIR HFA 108 (90 Base) MCG/ACT inhaler Inhale 2 puffs into the lungs every 6 (six) hours as needed for wheezing or shortness of breath.     . Probiotic Product (ALIGN) 4 MG CAPS Take 4 mg by mouth daily.    . promethazine (PHENERGAN) 25 MG tablet Take 25 mg by mouth every 6 (six) hours as needed for nausea or vomiting.     . pyridOXINE (VITAMIN B-6) 100 MG tablet Take 200 mg by  mouth daily.       ALLERGIES:   Allergies  Allergen Reactions  . Sulfa Antibiotics Other (See Comments)    "AKI"   . Erythromycin Itching  . Pravastatin Other (See Comments)    Used 01/2011 to 03/2011 (reaction unknown)  . Simvastatin Other (See Comments)    Reaction not known  . Codeine Nausea Only and Other (See Comments)    Reaction not known, but can tolerate Hycodan  . Gemfibrozil Itching    FAM HX: Family History  Problem Relation Age of Onset  . AAA (abdominal aortic aneurysm) Mother   . Multiple myeloma Mother   . GER disease Mother   . Coronary artery disease Father   . Heart disease Father   . Coronary artery disease Sister   . Coronary artery disease Brother   . Heart disease Brother   . Stroke Maternal Grandmother   . Heart attack Neg Hx     Social History:   reports that she has never smoked. She has never used smokeless tobacco. She reports previous alcohol use. She reports that she does not use drugs.  ROS: ROS: all other systems reviewed and are negative except as per HPI  Blood pressure (!) 118/44, pulse 62, temperature 97.7 F (36.5 C), resp. rate 18, height 5' (1.524 m), weight 90.7 kg, SpO2 97 %. PHYSICAL EXAM: Physical Exam  GEN NAD, sitting in chair HEENT EOMI PERRL  NECK no JVD PULM faint L basilar crackles (known small L pleural effusion on CXR), R lung clear CV RRR no m/r/g ABD soft EXT no edema at all NEURO AAO x 3 nonfocal and no asterixis SKIN warm and dry ACCESS: LUE AVF + T/B   Results for orders placed or performed during the hospital encounter of 03/27/19 (from the past 48 hour(s))  CBG monitoring, ED     Status: Abnormal   Collection Time: 03/28/19  2:07 PM  Result Value Ref Range   Glucose-Capillary 114 (H) 70 - 99 mg/dL    Comment: Glucose reference range applies only to samples taken after fasting for at least 8 hours.  Glucose, capillary     Status: None   Collection Time: 03/28/19  5:34 PM  Result Value Ref Range    Glucose-Capillary 85 70 - 99 mg/dL    Comment: Glucose reference range applies only to samples taken after fasting for at least 8 hours.  Glucose, capillary     Status: Abnormal   Collection Time: 03/28/19  9:08 PM  Result Value Ref Range   Glucose-Capillary 148 (H) 70 - 99 mg/dL    Comment: Glucose reference range applies only to samples taken after fasting for at least 8 hours.  Glucose, capillary     Status: Abnormal   Collection Time: 03/28/19 11:58 PM  Result Value Ref Range   Glucose-Capillary 120 (H) 70 - 99 mg/dL    Comment: Glucose reference range applies only to samples taken after fasting  for at least 8 hours.  Glucose, capillary     Status: Abnormal   Collection Time: 03/29/19  8:18 AM  Result Value Ref Range   Glucose-Capillary 116 (H) 70 - 99 mg/dL    Comment: Glucose reference range applies only to samples taken after fasting for at least 8 hours.  Glucose, capillary     Status: Abnormal   Collection Time: 03/29/19 12:03 PM  Result Value Ref Range   Glucose-Capillary 150 (H) 70 - 99 mg/dL    Comment: Glucose reference range applies only to samples taken after fasting for at least 8 hours.  Comprehensive metabolic panel     Status: Abnormal   Collection Time: 03/29/19 12:45 PM  Result Value Ref Range   Sodium 136 135 - 145 mmol/L   Potassium 5.1 3.5 - 5.1 mmol/L   Chloride 103 98 - 111 mmol/L   CO2 22 22 - 32 mmol/L   Glucose, Bld 153 (H) 70 - 99 mg/dL    Comment: Glucose reference range applies only to samples taken after fasting for at least 8 hours.   BUN 61 (H) 8 - 23 mg/dL   Creatinine, Ser 5.02 (H) 0.44 - 1.00 mg/dL   Calcium 9.6 8.9 - 10.3 mg/dL   Total Protein 5.5 (L) 6.5 - 8.1 g/dL   Albumin 2.7 (L) 3.5 - 5.0 g/dL   AST 34 15 - 41 U/L   ALT 39 0 - 44 U/L   Alkaline Phosphatase 72 38 - 126 U/L   Total Bilirubin 0.5 0.3 - 1.2 mg/dL   GFR calc non Af Amer 8 (L) >60 mL/min   GFR calc Af Amer 9 (L) >60 mL/min   Anion gap 11 5 - 15    Comment: Performed at  [facility] [address], [place], [place] [address]  CBC with Differential/Platelet     Status: Abnormal   Collection Time: 03/29/19 12:45 PM  Result Value Ref Range   WBC 7.6 4.0 - 10.5 K/uL   RBC 3.29 (L) 3.87 - 5.11 MIL/uL   Hemoglobin 10.0 (L) 12.0 - 15.0 g/dL   HCT 31.0 (L) 36.0 - 46.0 %   MCV 94.2 80.0 - 100.0 fL   MCH 30.4 26.0 - 34.0 pg   MCHC 32.3 30.0 - 36.0 g/dL   RDW 13.1 11.5 - 15.5 %   Platelets 169 150 - 400 K/uL   nRBC 0.0 0.0 - 0.2 %   Neutrophils Relative % 69 %   Neutro Abs 5.2 1.7 - 7.7 K/uL   Lymphocytes Relative 15 %   Lymphs Abs 1.1 0.7 - 4.0 K/uL   Monocytes Relative 12 %   Monocytes Absolute 0.9 0.1 - 1.0 K/uL   Eosinophils Relative 3 %   Eosinophils Absolute 0.2 0.0 - 0.5 K/uL   Basophils Relative 1 %   Basophils Absolute 0.0 0.0 - 0.1 K/uL   Immature Granulocytes 0 %   Abs Immature Granulocytes 0.03 0.00 - 0.07 K/uL    Comment: Performed at [facility], [address] [place], [place] [address]  Glucose, capillary     Status: None   Collection Time: 03/29/19  4:29 PM  Result Value Ref Range   Glucose-Capillary 91 70 - 99 mg/dL    Comment: Glucose reference range applies only to samples taken after fasting for at least 8 hours.  Glucose, capillary     Status: Abnormal   Collection Time: 03/29/19  9:49 PM  Result Value Ref Range  Glucose-Capillary 126 (H) 70 - 99 mg/dL    Comment: Glucose reference range applies only to samples taken after fasting for at least 8 hours.  Renal function panel     Status: Abnormal   Collection Time: 03/30/19  7:18 AM  Result Value Ref Range   Sodium 137 135 - 145 mmol/L   Potassium 4.8 3.5 - 5.1 mmol/L   Chloride 105 98 - 111 mmol/L   CO2 19 (L) 22 - 32 mmol/L   Glucose, Bld 112 (H) 70 - 99 mg/dL    Comment: Glucose reference range applies only to samples taken after fasting for at least 8 hours.   BUN 70 (H) 8 - 23 mg/dL   Creatinine, Ser 5.17 (H) 0.44 - 1.00 mg/dL   Calcium 9.2 8.9 - 10.3  mg/dL   Phosphorus 4.4 2.5 - 4.6 mg/dL   Albumin 2.6 (L) 3.5 - 5.0 g/dL   GFR calc non Af Amer 8 (L) >60 mL/min   GFR calc Af Amer 9 (L) >60 mL/min   Anion gap 13 5 - 15    Comment: Performed at [facility] [address] [place], [place] [address]  CBC with Differential/Platelet     Status: Abnormal   Collection Time: 03/30/19  7:18 AM  Result Value Ref Range   WBC 5.0 4.0 - 10.5 K/uL   RBC 2.96 (L) 3.87 - 5.11 MIL/uL   Hemoglobin 8.8 (L) 12.0 - 15.0 g/dL   HCT 27.5 (L) 36.0 - 46.0 %   MCV 92.9 80.0 - 100.0 fL   MCH 29.7 26.0 - 34.0 pg   MCHC 32.0 30.0 - 36.0 g/dL   RDW 13.1 11.5 - 15.5 %   Platelets 145 (L) 150 - 400 K/uL   nRBC 0.0 0.0 - 0.2 %   Neutrophils Relative % 52 %   Neutro Abs 2.6 1.7 - 7.7 K/uL   Lymphocytes Relative 28 %   Lymphs Abs 1.4 0.7 - 4.0 K/uL   Monocytes Relative 13 %   Monocytes Absolute 0.6 0.1 - 1.0 K/uL   Eosinophils Relative 5 %   Eosinophils Absolute 0.3 0.0 - 0.5 K/uL   Basophils Relative 1 %   Basophils Absolute 0.0 0.0 - 0.1 K/uL   Immature Granulocytes 1 %   Abs Immature Granulocytes 0.03 0.00 - 0.07 K/uL    Comment: Performed at [facility] [address], [place], [place] [address]  Glucose, capillary     Status: Abnormal   Collection Time: 03/30/19  7:49 AM  Result Value Ref Range   Glucose-Capillary 100 (H) 70 - 99 mg/dL    Comment: Glucose reference range applies only to samples taken after fasting for at least 8 hours.    DG Abd 1 View  Result Date: 03/29/2019 CLINICAL DATA:  Constipation 4 days. EXAM: ABDOMEN - 1 VIEW COMPARISON:  11/24/2008 FINDINGS: Bowel gas pattern is nonobstructive. Mild to moderate fecal retention over the right colon. There are surgical clips over the right upper quadrant. No free peritoneal air. Moderate curvature of the lumbar spine convex left with significant degenerative changes. Mild degenerate change of the hips. IMPRESSION: Nonobstructive bowel gas pattern with mild to moderate fecal  retention over the right colon. Electronically Signed   By: Marin Olp M.D.   On: 03/29/2019 13:33   ECHOCARDIOGRAM COMPLETE  Result Date: 03/28/2019    ECHOCARDIOGRAM REPORT   Patient Name:   MAEVEN MCDOUGALL Date of Exam: 03/28/2019 Medical Rec #:  [id number]  Height:       60.0 in Accession #:    [id number]     Weight:       200.0 lb Date of Birth:  May 26, 1945     BSA:          1.867 m Patient Age:    29 years       BP:           107/63 mmHg Patient Gender: F              HR:           60 bpm. Exam Location:  Inpatient Procedure: 2D Echo Indications:    murmur 785.2 fever 780.6  History:        Patient has prior history of Echocardiogram examinations, most                 recent 12/08/2010.  Sonographer:    Jannett Celestine RDCS (AE) Referring Phys: [phone] JARED M GARDNER  Sonographer Comments: restricted mobility IMPRESSIONS  1. Left ventricular ejection fraction, by estimation, is 65 to 70%. The left ventricle has normal function. The left ventricle has no regional wall motion abnormalities. There is mild left ventricular hypertrophy. Left ventricular diastolic parameters are indeterminate.  2. Right ventricule is not well visualized but systolic function appears grossly normal. The right ventricular size is normal. Tricuspid regurgitation signal is inadequate for assessing PA pressure.  3. The mitral valve is abnormal. Moderate mitral annular calcification. Trivial mitral valve regurgitation.  4. The aortic valve is tricuspid. Aortic valve regurgitation is trivial. Mild to moderate aortic valve sclerosis/calcification is present, without any evidence of aortic stenosis.  5. Aortic dilatation noted. 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  6. The inferior vena cava is normal in size with greater than 50% respiratory variability, suggesting right atrial pressure of 3 mmHg. FINDINGS  Left Ventricle: Left ventricular ejection fraction, by estimation, is 65 to 70%. The left ventricle has  normal function. The left ventricle has no regional wall motion abnormalities. The left ventricular internal cavity size was normal in size. There is  mild left ventricular hypertrophy. Left ventricular diastolic parameters are indeterminate. Right Ventricle: The right ventricular size is normal. No increase in right ventricular wall thickness. Right ventricular systolic function is normal. Tricuspid regurgitation signal is inadequate for assessing PA pressure. Left Atrium: Left atrial size was normal in size. Right Atrium: Right atrial size was not well visualized. Pericardium: Trivial pericardial effusion is present. Mitral Valve: The mitral valve is abnormal. Moderate mitral annular calcification. Trivial mitral valve regurgitation. Tricuspid Valve: The tricuspid valve is normal in structure. Tricuspid valve regurgitation is mild. Aortic Valve: The aortic valve is tricuspid. Aortic valve regurgitation is trivial. Mild to moderate aortic valve sclerosis/calcification is present, without any evidence of aortic stenosis. Pulmonic Valve: The pulmonic valve was not well visualized. Pulmonic valve regurgitation is trivial. Aorta: The aortic root is normal in size and structure and aortic dilatation noted. 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 Venous: The inferior vena cava is normal in size with greater than 50% respiratory variability, suggesting right atrial pressure of 3 mmHg. IAS/Shunts: No atrial level shunt detected by color flow Doppler.  LEFT VENTRICLE PLAX 2D LVIDd:         3.80 cm  Diastology LVIDs:         1.91 cm  LV e' lateral:   9.03 cm/s LV PW:  1.12 cm  LV E/e' lateral: 11.1 LV IVS:        1.07 cm  LV e' medial:    6.20 cm/s LVOT diam:     1.90 cm  LV E/e' medial:  16.1 LV SV:         64 LV SV Index:   34 LVOT Area:     2.84 cm  LEFT ATRIUM             Index LA diam:        3.30 cm 1.77 cm/m LA Vol (A2C):   38.5 ml 20.63 ml/m LA Vol (A4C):   46.2 ml 24.75 ml/m LA  Biplane Vol: 43.9 ml 23.52 ml/m  AORTIC VALVE LVOT Vmax:   88.60 cm/s LVOT Vmean:  60.800 cm/s LVOT VTI:    0.224 m  AORTA Ao Root diam: 2.80 cm MITRAL VALVE MV Area (PHT): 2.66 cm    SHUNTS MV Decel Time: 285 msec    Systemic VTI:  0.22 m MV E velocity: 99.80 cm/s  Systemic Diam: 1.90 cm MV A velocity: 70.40 cm/s MV E/A ratio:  1.42 Oswaldo Milian MD Electronically signed by Oswaldo Milian MD Signature Date/Time: 03/28/2019/4:42:56 PM    Final     Assessment/Plan 1.  AKI on CKD V: not uremic, looks like baseline Cr 3.79-4.3 per OP labs, last 3/9.  Small AKI in the setting of acute encephalopathy/ restarting Lasix.  Looks like she wasn't overloaded on TTE 3/17 (> 50% respiratory variability of IVC).  No indication to start HD at this time.  Will get OP labs Monday and then has an appt already with Dr. Joelyn Oms 04/12/2018.  D/w pt and husband.  2.  HTN: BP pretty well controlled- would hold Lasix 20 mg daily (home dose) for now, continue all other meds  3.  DM II: per primary  4.  Dispo: OK to go home today.  Not uremic and doesn't need to start HD at present.      Madelon Lips 03/30/2019, 11:10 AM

## 2019-03-30 NOTE — Plan of Care (Signed)
Pt's care plan goals met. She is adequate for discharge.

## 2019-04-01 LAB — CULTURE, BLOOD (ROUTINE X 2): Culture: NO GROWTH

## 2019-04-02 LAB — CULTURE, BLOOD (ROUTINE X 2): Culture: NO GROWTH

## 2019-04-10 ENCOUNTER — Other Ambulatory Visit: Payer: Self-pay

## 2019-04-10 ENCOUNTER — Encounter (HOSPITAL_COMMUNITY): Payer: Self-pay | Admitting: Emergency Medicine

## 2019-04-10 ENCOUNTER — Emergency Department (HOSPITAL_COMMUNITY)
Admission: EM | Admit: 2019-04-10 | Discharge: 2019-04-10 | Disposition: A | Discharge status: Home / Self Care | Payer: Medicare PPO | Attending: Emergency Medicine | Admitting: Emergency Medicine

## 2019-04-10 DIAGNOSIS — Z951 Presence of aortocoronary bypass graft: Secondary | ICD-10-CM | POA: Insufficient documentation

## 2019-04-10 DIAGNOSIS — N185 Chronic kidney disease, stage 5: Secondary | ICD-10-CM | POA: Diagnosis not present

## 2019-04-10 DIAGNOSIS — Z7982 Long term (current) use of aspirin: Secondary | ICD-10-CM | POA: Insufficient documentation

## 2019-04-10 DIAGNOSIS — E162 Hypoglycemia, unspecified: Secondary | ICD-10-CM

## 2019-04-10 DIAGNOSIS — J45909 Unspecified asthma, uncomplicated: Secondary | ICD-10-CM | POA: Diagnosis not present

## 2019-04-10 DIAGNOSIS — E11649 Type 2 diabetes mellitus with hypoglycemia without coma: Secondary | ICD-10-CM | POA: Diagnosis not present

## 2019-04-10 DIAGNOSIS — R4182 Altered mental status, unspecified: Secondary | ICD-10-CM | POA: Diagnosis present

## 2019-04-10 DIAGNOSIS — Z7984 Long term (current) use of oral hypoglycemic drugs: Secondary | ICD-10-CM | POA: Diagnosis not present

## 2019-04-10 DIAGNOSIS — Z79899 Other long term (current) drug therapy: Secondary | ICD-10-CM | POA: Diagnosis not present

## 2019-04-10 DIAGNOSIS — I120 Hypertensive chronic kidney disease with stage 5 chronic kidney disease or end stage renal disease: Secondary | ICD-10-CM | POA: Insufficient documentation

## 2019-04-10 DIAGNOSIS — I2510 Atherosclerotic heart disease of native coronary artery without angina pectoris: Secondary | ICD-10-CM | POA: Insufficient documentation

## 2019-04-10 LAB — CBC
HCT: 31.1 % — ABNORMAL LOW (ref 36.0–46.0)
Hemoglobin: 9.7 g/dL — ABNORMAL LOW (ref 12.0–15.0)
MCH: 29.8 pg (ref 26.0–34.0)
MCHC: 31.2 g/dL (ref 30.0–36.0)
MCV: 95.7 fL (ref 80.0–100.0)
Platelets: 196 10*3/uL (ref 150–400)
RBC: 3.25 MIL/uL — ABNORMAL LOW (ref 3.87–5.11)
RDW: 12.8 % (ref 11.5–15.5)
WBC: 6.9 10*3/uL (ref 4.0–10.5)
nRBC: 0 % (ref 0.0–0.2)

## 2019-04-10 LAB — CBG MONITORING, ED
Glucose-Capillary: 39 mg/dL — CL (ref 70–99)
Glucose-Capillary: 82 mg/dL (ref 70–99)
Glucose-Capillary: 122 mg/dL — ABNORMAL HIGH (ref 70–99)

## 2019-04-10 LAB — COMPREHENSIVE METABOLIC PANEL
ALT: 41 U/L (ref 0–44)
AST: 27 U/L (ref 15–41)
Albumin: 3 g/dL — ABNORMAL LOW (ref 3.5–5.0)
Alkaline Phosphatase: 82 U/L (ref 38–126)
Anion gap: 11 (ref 5–15)
BUN: 36 mg/dL — ABNORMAL HIGH (ref 8–23)
CO2: 20 mmol/L — ABNORMAL LOW (ref 22–32)
Calcium: 9.2 mg/dL (ref 8.9–10.3)
Chloride: 111 mmol/L (ref 98–111)
Creatinine, Ser: 3.95 mg/dL — ABNORMAL HIGH (ref 0.44–1.00)
GFR calc Af Amer: 12 mL/min — ABNORMAL LOW (ref >60)
GFR calc non Af Amer: 11 mL/min — ABNORMAL LOW (ref >60)
Glucose, Bld: 51 mg/dL — ABNORMAL LOW (ref 70–99)
Potassium: 5.1 mmol/L (ref 3.5–5.1)
Sodium: 142 mmol/L (ref 135–145)
Total Bilirubin: 0.6 mg/dL (ref 0.3–1.2)
Total Protein: 5.6 g/dL — ABNORMAL LOW (ref 6.5–8.1)

## 2019-04-10 MED ORDER — SODIUM CHLORIDE 0.9% FLUSH: 3.0000 mL | Freq: Once | INTRAVENOUS | Status: DC

## 2019-04-10 NOTE — Discharge Instructions (Signed)
Make sure you are eating regularly and if you have to go without eating hold your medication until you know you can eat.  Your labs are all improving today and your kidney function today is 3.9 which is down from 5.

## 2019-04-10 NOTE — ED Triage Notes (Signed)
Pt discharged from here Friday with UTI and AMS. Pt went to PCP today for checkup. Husband states she became lethargic and altered around 9:30. Husband reports she did not eat today but took her DM medications. CBG 39, given OJ

## 2019-04-10 NOTE — ED Notes (Signed)
Pt ambulated to br with no assistance.

## 2019-04-10 NOTE — ED Provider Notes (Signed)
[facility] EMERGENCY DEPARTMENT Provider Note   CSN: [id number] Arrival date & time: 04/10/19  1118     History Chief Complaint  Patient presents with  . Altered Mental Status    Illene Sweeting is a 74 y.o. female.  74 year old female with a history of hypertension, diabetes, renal cell carcinoma status post nephrectomy, CABG, CKD who was recently treated for possible infectious causes due to altered mental status on 03/30/2019 but has been home and doing well.  Patient reports she woke up this morning and was feeling normal.  Checked her blood sugar and it was in the low 100s and she took half a tablet of her glipizide and then headed to her doctor's appointment.  She normally eats with the medication but did not eat today because thought she needed to take fasting blood work labs.  Upon arrival to the doctor's office has been retort ports patient started to get weak, tired and started not acting like herself.  [place] office sent her to the emergency room.  Upon arrival here patient was found to have a blood sugar of 39 and was given orange juice.  She now states she feels much better.  She denies any pain, shortness of breath, nausea, vomiting.  She has no localized weakness.  The history is provided by the patient and the spouse.  Altered Mental Status Presenting symptoms: confusion and partial responsiveness   Severity:  Severe Most recent episode:  Today Episode history:  Single Timing:  Constant Progression:  Improving Chronicity:  New Context: recent illness and recent infection   Associated symptoms: weakness   Associated symptoms: no abdominal pain, no fever, no nausea and no vomiting        Past Medical History:  Diagnosis Date  . Anemia   . Arthritis    hands  . Asthma    childhood  . Bacteremia due to Escherichia coli June 2015    Currently being treated with anti-biotics  . CAD in native artery 12/2010   a) Cath for exertional angina & EKG  changes: 40% LM, 80% mid LAD.  95% ost cX, 80-90% PDA --> CABG X3; b) Post CABG CATH for + Myoview with basal anterior ischemia -> Ost LAD 80% (diffuse) then 100% after SP2, RI 70% (too small for PCI), Ost-prox Cx 99% & OM1 90%, OstrPDA 80% (small); Occluded SVG-rPDA.  Patent LIMA-dLAD, SVG-OM: culprit ~ p-m LAD pre-LIMA & RI - not good PCI target --> Med Rx  . CKD (chronic kidney disease) stage 3, GFR 30-59 ml/min   . Degenerative disc disease, cervical    Degenerative disc disease, cervical [722.4]  . Diabetes mellitus without complication ([place])    Type II  . Endometrial cancer [facility]) November 2015   Treated with TAH with pelvic lymphadenectomy followed by radiation and chemotherapy  . GERD (gastroesophageal reflux disease)   . Hearing loss    bilateral - no hearing aids  . Heart murmur   . History of asthma     childhood  . History of blood transfusion   . History of pneumonia    "2-3 times"  . History of unstable angina November/December 2012   T wave inversions in inferolateral leads.  No stress test performed.  . Hyperlipidemia LDL goal <70   . Hypertension, essential, benign   . Neuropathy    hands  . Pneumonia 2018  . Renal cell carcinoma ([place]) 11/18/2008   T2aNx s/p partial left nephrectomy  . 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 Stroke [facility])    TIA- no residual   . TIA (transient ischemic attack) 1992 &  2010    Patient Active Problem List   Diagnosis Date Noted  . CKD (chronic kidney disease) stage 5, GFR less than 15 ml/min (HCC) 03/27/2019  . Fever 03/27/2019  . Acute encephalopathy 05/09/2017  . Coronary artery disease involving native coronary artery of native heart with angina pectoris ([place]) 09/08/2014  . Acute-on-chronic kidney injury ([place]) 07/29/2014  . Hyperkalemia 07/29/2014  . CAD (coronary artery disease) of artery bypass graft 07/29/2014  . Type 2 diabetes mellitus with renal manifestations, controlled ([place]) 07/10/2014  .  Endometrial cancer ([place]) 07/10/2014  . Obesity (BMI 30-39.9) 09/03/2012  . Hyperlipidemia LDL goal <70   . Anemia 12/28/2010  . S/P CABG x 3 12/25/2010  . Renal cell carcinoma ([place]) 12/23/2010    Class: History of  . Essential hypertension 12/23/2010    Class: History of    Past Surgical History:  Procedure Laterality Date  . ANTERIOR CERVICAL DECOMP/DISCECTOMY FUSION  10/11/2005   multi-level  . AV FISTULA PLACEMENT Left 08/03/2017   Procedure: ARTERIOVENOUS (AV) FISTULA CREATION LEFT ARM;  Surgeon: Rosetta Posner, MD;  Location: [facility];  Service: Vascular;  Laterality: Left;  . CARDIAC CATHETERIZATION  12/24/10   40% left main, 80% mid LAD, 95% ostial circumflex, 80-90% PDA.  [name] CARDIAC CATHETERIZATION N/A 07/01/2014   Procedure: Left Heart Cath and Cors/Grafts Angiography;  Surgeon: Leonie Man, MD;  Location: [facility] INVASIVE CV LAB:  For Abn Nuc @ [facility]: Ost LAD 80%, mid LAD 100% after S/P 2, 70% RI (no PCI target), ost-prox Cx 99%, OM1 90%. Ost rPDA 80% (~ pre-CABG), occluded SVG-rPDA, patent LIMA-dLAD, SVG OM; potential culprit for abn Nuc scan = pLAD Dz, small RI 70% or PDA -> med Rx  . CAROTID ENDARTERECTOMY Left   . CHOLECYSTECTOMY  ~ 1971  . COLONOSCOPY    . CORONARY ARTERY BYPASS GRAFT  12/25/2010   Procedure: CORONARY ARTERY BYPASS GRAFTING (CABGX3 - LIMA-LAD, SVG RPL, SVG-Circumflex);  Surgeon: Rexene Alberts, MD;  Location: [facility];  Service: Open Heart Surgery;  Laterality: N/A;  . DILATATION & CURETTAGE/HYSTEROSCOPY WITH MYOSURE N/A 11/02/2013   Procedure: DILATATION & CURETTAGE/HYSTEROSCOPY WITH MYOSURE ABLATION;  Surgeon: Allena Katz, MD;  Location: [facility];  Service: Gynecology;  Laterality: N/A;  . DOPPLER ECHOCARDIOGRAPHY  12/08/2010   EF =>55%,MILD CONCENTRIC LEFT VENTRICULAR HYPERTROPHY  . EYE SURGERY Bilateral    Cataract  . LEFT HEART CATHETERIZATION WITH CORONARY ANGIOGRAM N/A 12/24/2010   Procedure: LEFT HEART CATHETERIZATION WITH CORONARY ANGIOGRAM;  Surgeon:  Leonie Man, MD;  Location: [facility] CATH LAB;  Service: Cardiovascular;  Laterality: N/A;  . NM MYOCAR SINGLE W/SPECT  07/26/2007   EF 79%, LEFT VENT.FUNCTION NORMAL  . PARTIAL NEPHRECTOMY Left 11/18/2008   left partial nephrectomy for renal cell CA  . PET Myocardial Perfusion Scan  06/13/2014   At [place]: Moderate size, mild severity completely reversible defect involving the basal anterior, mid anterior and apical anterior segments consistent with ischemia. EF 65% with normal global function.  . TONSILLECTOMY     "in college"  . TOTAL ABDOMINAL HYSTERECTOMY  November 2015    At [facility]: Robotic procedure with pelvic lymphadenectomy  . TRANSTHORACIC ECHOCARDIOGRAM  06/13/2014   At [place]: EF 60-65%. Grade 1 diastolic dysfunction. Mild MR. Aortic sclerosis. Moderate pulmonary hypertension  . TUBAL LIGATION  ~  1984  . UPPER GI ENDOSCOPY    . WISDOM TOOTH EXTRACTION       OB History   No obstetric history on file.     Family History  Problem Relation Age of Onset  . AAA (abdominal aortic aneurysm) Mother   . Multiple myeloma Mother   . GER disease Mother   . Coronary artery disease Father   . Heart disease Father   . Coronary artery disease Sister   . Coronary artery disease Brother   . Heart disease Brother   . Stroke Maternal Grandmother   . Heart attack Neg Hx     Social History   Tobacco Use  . Smoking status: Never Smoker  . Smokeless tobacco: Never Used  Substance Use Topics  . Alcohol use: Not Currently    Alcohol/week: 0.0 standard drinks  . Drug use: No    Home Medications Prior to Admission medications   Medication Sig Start Date End Date Taking? Authorizing Provider  Alpha-Lipoic Acid 600 MG CAPS Take 600 mg by mouth daily.    [provider]  aspirin 81 MG tablet Take 81 mg by mouth at bedtime.     [provider]  atorvastatin (LIPITOR) 40 MG tablet Take 40 mg by mouth at bedtime.     [provider]    calcitRIOL (ROCALTROL) 0.25 MCG capsule Take 0.25 mcg by mouth daily with breakfast.    [provider]  famotidine (PEPCID) 20 MG tablet Take 40 mg by mouth daily before breakfast.     [provider]  gabapentin (NEURONTIN) 100 MG capsule Take 100 mg by mouth 3 (three) times daily.     [provider]  glipiZIDE (GLUCOTROL) 5 MG tablet Take 2.5 mg by mouth 2 (two) times daily before a meal.     [provider]  HYDROcodone-homatropine (HYCODAN) 5-1.5 MG/5ML syrup Take 5 mLs by mouth every 6 (six) hours as needed for cough.  05/06/17   [provider]  isosorbide mononitrate (IMDUR) 60 MG 24 hr tablet Take 1 tablet (60 mg total) by mouth daily. 01/26/19 04/26/19  Leonie Man, MD  loperamide (IMODIUM) 2 MG capsule Take 2 mg by mouth 4 (four) times daily as needed for diarrhea or loose stools.    [provider]  loratadine (CLARITIN) 10 MG tablet Take 10 mg by mouth daily.    [provider]  magnesium oxide (MAG-OX) 400 MG tablet Take 400 mg by mouth daily.    [provider]  metoprolol tartrate (LOPRESSOR) 50 MG tablet Take 1 tablet (50 mg total) by mouth 2 (two) times daily. 02/27/19   Leonie Man, MD  nitroGLYCERIN (NITROSTAT) 0.4 MG SL tablet PLACE 1 TABLET (0.4 MG TOTAL) UNDER THE TONGUE EVERY 5 (FIVE) MINUTES AS NEEDED FOR CHEST PAIN. Patient taking differently: Place 0.4 mg under the tongue every 5 (five) minutes as needed for chest pain.  01/30/19   Leonie Man, MD  Omega-3 Fatty Acids (FISH OIL) 1200 MG CAPS Take 1,200 mg by mouth in the morning and at bedtime.     [provider]  omeprazole (PRILOSEC) 40 MG capsule Take 40 mg by mouth daily before breakfast.     [provider]  PROAIR HFA 108 (90 Base) MCG/ACT inhaler Inhale 2 puffs into the lungs every 6 (six) hours as needed for wheezing or shortness of breath.  05/09/17   [provider]  Probiotic Product (ALIGN) 4 MG CAPS  Take 4  mg by mouth daily.    [provider]  promethazine (PHENERGAN) 25 MG tablet Take 25 mg by mouth every 6 (six) hours as needed for nausea or vomiting.     [provider]  pyridOXINE (VITAMIN B-6) 100 MG tablet Take 200 mg by mouth daily.     [provider]    Allergies    Sulfa antibiotics, Erythromycin, Pravastatin, Simvastatin, Codeine, and Gemfibrozil  Review of Systems   Review of Systems  Constitutional: Negative for fever.  Gastrointestinal: Negative for abdominal pain, nausea and vomiting.  Neurological: Positive for weakness.  Psychiatric/Behavioral: Positive for confusion.  All other systems reviewed and are negative.   Physical Exam Updated Vital Signs BP (!) 164/87   Pulse 62   Temp (!) 97.4 F (36.3 C) (Oral)   Resp 18   Ht 5' (1.524 m)   Wt 91 kg   SpO2 94%   BMI 39.18 kg/m   Physical Exam Vitals and nursing note reviewed.  Constitutional:      General: She is not in acute distress.    Appearance: Normal appearance. She is well-developed and normal weight.  HENT:     Head: Normocephalic and atraumatic.     Mouth/Throat:     Mouth: Mucous membranes are moist.  Eyes:     Pupils: Pupils are equal, round, and reactive to light.  Cardiovascular:     Rate and Rhythm: Normal rate and regular rhythm.     Heart sounds: Normal heart sounds. No murmur. No friction rub.  Pulmonary:     Effort: Pulmonary effort is normal.     Breath sounds: Normal breath sounds. No wheezing or rales.  Abdominal:     General: Bowel sounds are normal. There is no distension.     Palpations: Abdomen is soft.     Tenderness: There is no abdominal tenderness. There is no guarding or rebound.  Musculoskeletal:        General: No tenderness. Normal range of motion.     Comments: No edema  Skin:    General: Skin is warm and dry.     Findings: No rash.  Neurological:     General: No focal deficit present.     Mental Status: She is alert and  oriented to person, place, and time. Mental status is at baseline.     Cranial Nerves: No cranial nerve deficit.  Psychiatric:        Mood and Affect: Mood normal.        Behavior: Behavior normal.        Thought Content: Thought content normal.     ED Results / Procedures / Treatments   Labs (all labs ordered are listed, but only abnormal results are displayed) Labs Reviewed  COMPREHENSIVE METABOLIC PANEL - Abnormal; Notable for the following components:      Result Value   CO2 20 (*)    Glucose, Bld 51 (*)    BUN 36 (*)    Creatinine, Ser 3.95 (*)    Total Protein 5.6 (*)    Albumin 3.0 (*)    GFR calc non Af Amer 11 (*)    GFR calc Af Amer 12 (*)    All other components within normal limits  CBC - Abnormal; Notable for the following components:   RBC 3.25 (*)    Hemoglobin 9.7 (*)    HCT 31.1 (*)    All other components within normal limits  CBG MONITORING, ED - Abnormal; Notable for the  following components:   Glucose-Capillary 39 (*)    All other components within normal limits  CBG MONITORING, ED - Abnormal; Notable for the following components:   Glucose-Capillary 122 (*)    All other components within normal limits  CBG MONITORING, ED    EKG None  Radiology No results found.  Procedures Procedures (including critical care time)  Medications Ordered in ED Medications  sodium chloride flush (NS) 0.9 % injection 3 mL (has no administration in time range)    ED Course  I have reviewed the triage vital signs and the nursing notes.  Pertinent labs & imaging results that were available during my care of the patient were reviewed by me and considered in my medical decision making (see chart for details).    MDM Rules/Calculators/A&P                      Elderly female presenting today with altered mental status from her doctor's office that occurred abruptly this morning.  Patient was normal when she woke up.  She did not eat this morning like usual and  did take her diabetic medications.  Patient was found to have a blood sugar of 39 and was given orange juice and graham crackers.  Repeat sugar was in the 80s and patient is feeling much better.  She now has no complaints and is well-appearing on exam.  Vital signs are reassuring. We will continue to monitor blood sugar to ensure she does not continue to become hypoglycemic.  Creatinine is improving today is 3.9 but was 5 when she left the hospital.  1:40 PM Repeat sugar is 122.  Patient is feeling much better and is at her baseline per her husband.  Feel that it is reasonable that she be discharged home they can continue to monitor her sugar at home and she can follow-up with her doctor to continue to monitor her creatinine.  Final Clinical Impression(s) / ED Diagnoses Final diagnoses:  Hypoglycemia    Rx / DC Orders ED Discharge Orders    None       Blanchie Dessert, MD 04/10/19 1340

## 2019-04-10 NOTE — ED Notes (Signed)
Patient verbalizes understanding of discharge instructions. Opportunity for questioning and answers were provided. Armband removed by staff, pt discharged from ED. Pt A&O x4.

## 2019-04-20 ENCOUNTER — Inpatient Hospital Stay (HOSPITAL_COMMUNITY)
Admission: EM | Admit: 2019-04-20 | Discharge: 2019-04-29 | DRG: 070 | Disposition: A | Discharge status: Home / Self Care | Payer: Medicare PPO | Attending: Internal Medicine | Admitting: Internal Medicine

## 2019-04-20 ENCOUNTER — Emergency Department (HOSPITAL_COMMUNITY): Payer: Medicare PPO

## 2019-04-20 ENCOUNTER — Other Ambulatory Visit: Payer: Self-pay

## 2019-04-20 DIAGNOSIS — I2510 Atherosclerotic heart disease of native coronary artery without angina pectoris: Secondary | ICD-10-CM | POA: Diagnosis present

## 2019-04-20 DIAGNOSIS — E1122 Type 2 diabetes mellitus with diabetic chronic kidney disease: Secondary | ICD-10-CM | POA: Diagnosis present

## 2019-04-20 DIAGNOSIS — I25810 Atherosclerosis of coronary artery bypass graft(s) without angina pectoris: Secondary | ICD-10-CM | POA: Diagnosis present

## 2019-04-20 DIAGNOSIS — Z905 Acquired absence of kidney: Secondary | ICD-10-CM

## 2019-04-20 DIAGNOSIS — E876 Hypokalemia: Secondary | ICD-10-CM | POA: Diagnosis present

## 2019-04-20 DIAGNOSIS — E1129 Type 2 diabetes mellitus with other diabetic kidney complication: Secondary | ICD-10-CM | POA: Diagnosis present

## 2019-04-20 DIAGNOSIS — E538 Deficiency of other specified B group vitamins: Secondary | ICD-10-CM | POA: Diagnosis present

## 2019-04-20 DIAGNOSIS — Z85528 Personal history of other malignant neoplasm of kidney: Secondary | ICD-10-CM

## 2019-04-20 DIAGNOSIS — Z6834 Body mass index (BMI) 34.0-34.9, adult: Secondary | ICD-10-CM

## 2019-04-20 DIAGNOSIS — E785 Hyperlipidemia, unspecified: Secondary | ICD-10-CM | POA: Diagnosis present

## 2019-04-20 DIAGNOSIS — C649 Malignant neoplasm of unspecified kidney, except renal pelvis: Secondary | ICD-10-CM | POA: Diagnosis present

## 2019-04-20 DIAGNOSIS — G9341 Metabolic encephalopathy: Secondary | ICD-10-CM | POA: Diagnosis present

## 2019-04-20 DIAGNOSIS — E1169 Type 2 diabetes mellitus with other specified complication: Secondary | ICD-10-CM | POA: Diagnosis present

## 2019-04-20 DIAGNOSIS — E669 Obesity, unspecified: Secondary | ICD-10-CM | POA: Diagnosis present

## 2019-04-20 DIAGNOSIS — R471 Dysarthria and anarthria: Secondary | ICD-10-CM | POA: Diagnosis not present

## 2019-04-20 DIAGNOSIS — G319 Degenerative disease of nervous system, unspecified: Secondary | ICD-10-CM | POA: Diagnosis present

## 2019-04-20 DIAGNOSIS — Z8673 Personal history of transient ischemic attack (TIA), and cerebral infarction without residual deficits: Secondary | ICD-10-CM | POA: Diagnosis not present

## 2019-04-20 DIAGNOSIS — Z8542 Personal history of malignant neoplasm of other parts of uterus: Secondary | ICD-10-CM

## 2019-04-20 DIAGNOSIS — R29709 NIHSS score 9: Secondary | ICD-10-CM | POA: Diagnosis not present

## 2019-04-20 DIAGNOSIS — Z981 Arthrodesis status: Secondary | ICD-10-CM | POA: Diagnosis not present

## 2019-04-20 DIAGNOSIS — D649 Anemia, unspecified: Secondary | ICD-10-CM | POA: Diagnosis present

## 2019-04-20 DIAGNOSIS — Z8249 Family history of ischemic heart disease and other diseases of the circulatory system: Secondary | ICD-10-CM

## 2019-04-20 DIAGNOSIS — Z79899 Other long term (current) drug therapy: Secondary | ICD-10-CM

## 2019-04-20 DIAGNOSIS — Z882 Allergy status to sulfonamides status: Secondary | ICD-10-CM

## 2019-04-20 DIAGNOSIS — E1151 Type 2 diabetes mellitus with diabetic peripheral angiopathy without gangrene: Secondary | ICD-10-CM | POA: Diagnosis present

## 2019-04-20 DIAGNOSIS — G459 Transient cerebral ischemic attack, unspecified: Secondary | ICD-10-CM | POA: Diagnosis not present

## 2019-04-20 DIAGNOSIS — R4182 Altered mental status, unspecified: Secondary | ICD-10-CM

## 2019-04-20 DIAGNOSIS — Z90710 Acquired absence of both cervix and uterus: Secondary | ICD-10-CM

## 2019-04-20 DIAGNOSIS — E1136 Type 2 diabetes mellitus with diabetic cataract: Secondary | ICD-10-CM | POA: Diagnosis present

## 2019-04-20 DIAGNOSIS — E875 Hyperkalemia: Secondary | ICD-10-CM | POA: Diagnosis present

## 2019-04-20 DIAGNOSIS — R410 Disorientation, unspecified: Secondary | ICD-10-CM | POA: Diagnosis not present

## 2019-04-20 DIAGNOSIS — Z20822 Contact with and (suspected) exposure to covid-19: Secondary | ICD-10-CM | POA: Diagnosis present

## 2019-04-20 DIAGNOSIS — Z992 Dependence on renal dialysis: Secondary | ICD-10-CM

## 2019-04-20 DIAGNOSIS — G9340 Encephalopathy, unspecified: Secondary | ICD-10-CM | POA: Diagnosis present

## 2019-04-20 DIAGNOSIS — Z9221 Personal history of antineoplastic chemotherapy: Secondary | ICD-10-CM

## 2019-04-20 DIAGNOSIS — R4701 Aphasia: Secondary | ICD-10-CM | POA: Diagnosis not present

## 2019-04-20 DIAGNOSIS — Z781 Physical restraint status: Secondary | ICD-10-CM

## 2019-04-20 DIAGNOSIS — K219 Gastro-esophageal reflux disease without esophagitis: Secondary | ICD-10-CM | POA: Diagnosis present

## 2019-04-20 DIAGNOSIS — N2581 Secondary hyperparathyroidism of renal origin: Secondary | ICD-10-CM | POA: Diagnosis present

## 2019-04-20 DIAGNOSIS — I871 Compression of vein: Secondary | ICD-10-CM

## 2019-04-20 DIAGNOSIS — F0390 Unspecified dementia without behavioral disturbance: Secondary | ICD-10-CM | POA: Diagnosis present

## 2019-04-20 DIAGNOSIS — I272 Pulmonary hypertension, unspecified: Secondary | ICD-10-CM | POA: Diagnosis present

## 2019-04-20 DIAGNOSIS — D631 Anemia in chronic kidney disease: Secondary | ICD-10-CM | POA: Diagnosis present

## 2019-04-20 DIAGNOSIS — Z923 Personal history of irradiation: Secondary | ICD-10-CM | POA: Diagnosis not present

## 2019-04-20 DIAGNOSIS — I10 Essential (primary) hypertension: Secondary | ICD-10-CM | POA: Diagnosis present

## 2019-04-20 DIAGNOSIS — Z885 Allergy status to narcotic agent status: Secondary | ICD-10-CM

## 2019-04-20 DIAGNOSIS — N186 End stage renal disease: Secondary | ICD-10-CM | POA: Diagnosis present

## 2019-04-20 DIAGNOSIS — H9190 Unspecified hearing loss, unspecified ear: Secondary | ICD-10-CM | POA: Diagnosis present

## 2019-04-20 DIAGNOSIS — Z9049 Acquired absence of other specified parts of digestive tract: Secondary | ICD-10-CM

## 2019-04-20 DIAGNOSIS — F05 Delirium due to known physiological condition: Secondary | ICD-10-CM | POA: Diagnosis present

## 2019-04-20 DIAGNOSIS — Z7982 Long term (current) use of aspirin: Secondary | ICD-10-CM

## 2019-04-20 DIAGNOSIS — M1990 Unspecified osteoarthritis, unspecified site: Secondary | ICD-10-CM | POA: Diagnosis present

## 2019-04-20 DIAGNOSIS — I120 Hypertensive chronic kidney disease with stage 5 chronic kidney disease or end stage renal disease: Secondary | ICD-10-CM | POA: Diagnosis present

## 2019-04-20 DIAGNOSIS — Z888 Allergy status to other drugs, medicaments and biological substances status: Secondary | ICD-10-CM

## 2019-04-20 DIAGNOSIS — Z823 Family history of stroke: Secondary | ICD-10-CM

## 2019-04-20 DIAGNOSIS — Z807 Family history of other malignant neoplasms of lymphoid, hematopoietic and related tissues: Secondary | ICD-10-CM

## 2019-04-20 DIAGNOSIS — Z951 Presence of aortocoronary bypass graft: Secondary | ICD-10-CM

## 2019-04-20 DIAGNOSIS — Z881 Allergy status to other antibiotic agents status: Secondary | ICD-10-CM

## 2019-04-20 DIAGNOSIS — R29810 Facial weakness: Secondary | ICD-10-CM | POA: Diagnosis not present

## 2019-04-20 LAB — CBC
HCT: 30.7 % — ABNORMAL LOW (ref 36.0–46.0)
Hemoglobin: 9.9 g/dL — ABNORMAL LOW (ref 12.0–15.0)
MCH: 30.5 pg (ref 26.0–34.0)
MCHC: 32.2 g/dL (ref 30.0–36.0)
MCV: 94.5 fL (ref 80.0–100.0)
Platelets: 202 10*3/uL (ref 150–400)
RBC: 3.25 MIL/uL — ABNORMAL LOW (ref 3.87–5.11)
RDW: 12.7 % (ref 11.5–15.5)
WBC: 6.4 10*3/uL (ref 4.0–10.5)
nRBC: 0 % (ref 0.0–0.2)

## 2019-04-20 LAB — COMPREHENSIVE METABOLIC PANEL
ALT: 25 U/L (ref 0–44)
AST: 18 U/L (ref 15–41)
Albumin: 3 g/dL — ABNORMAL LOW (ref 3.5–5.0)
Alkaline Phosphatase: 90 U/L (ref 38–126)
Anion gap: 7 (ref 5–15)
BUN: 42 mg/dL — ABNORMAL HIGH (ref 8–23)
CO2: 24 mmol/L (ref 22–32)
Calcium: 9.5 mg/dL (ref 8.9–10.3)
Chloride: 107 mmol/L (ref 98–111)
Creatinine, Ser: 4.43 mg/dL — ABNORMAL HIGH (ref 0.44–1.00)
GFR calc Af Amer: 11 mL/min — ABNORMAL LOW (ref >60)
GFR calc non Af Amer: 9 mL/min — ABNORMAL LOW (ref >60)
Glucose, Bld: 130 mg/dL — ABNORMAL HIGH (ref 70–99)
Potassium: 5.7 mmol/L — ABNORMAL HIGH (ref 3.5–5.1)
Sodium: 138 mmol/L (ref 135–145)
Total Bilirubin: 0.5 mg/dL (ref 0.3–1.2)
Total Protein: 5.4 g/dL — ABNORMAL LOW (ref 6.5–8.1)

## 2019-04-20 LAB — URINALYSIS, ROUTINE W REFLEX MICROSCOPIC
Bilirubin Urine: NEGATIVE
Glucose, UA: NEGATIVE mg/dL
Hgb urine dipstick: NEGATIVE
Ketones, ur: NEGATIVE mg/dL
Nitrite: NEGATIVE
Protein, ur: >=300 mg/dL — AB
Specific Gravity, Urine: 1.011 (ref 1.005–1.030)
pH: 7 (ref 5.0–8.0)

## 2019-04-20 LAB — CBG MONITORING, ED: Glucose-Capillary: 124 mg/dL — ABNORMAL HIGH (ref 70–99)

## 2019-04-20 MED ORDER — SODIUM ZIRCONIUM CYCLOSILICATE 10 G PO PACK
10.0000 g | PACK | Freq: Once | ORAL | Status: AC
  Administered 2019-04-20: 10 g via ORAL
  Filled 2019-04-20: qty 1

## 2019-04-20 MED ORDER — SODIUM CHLORIDE 0.9% FLUSH
3.0000 mL | Freq: Once | INTRAVENOUS | Status: AC
  Administered 2019-04-25: 21:00:00 10 mL via INTRAVENOUS

## 2019-04-20 MED ORDER — CALCITRIOL 0.25 MCG PO CAPS
0.2500 ug | ORAL_CAPSULE | Freq: Every day | ORAL | Status: DC
  Administered 2019-04-20 – 2019-04-29 (×9): 0.25 ug via ORAL
  Filled 2019-04-20 (×9): qty 1

## 2019-04-20 MED ORDER — CHLORHEXIDINE GLUCONATE CLOTH 2 % EX PADS
6.0000 | MEDICATED_PAD | Freq: Every day | CUTANEOUS | Status: DC
  Administered 2019-04-22: 6 via TOPICAL

## 2019-04-20 NOTE — ED Notes (Signed)
Pt given water and sandwich per RN.

## 2019-04-20 NOTE — H&P (Signed)
History and Physical   Dana Jackson VXY:[id number] DOB: February 16, 1945 DOA: 04/20/2019  Referring MD/NP/PA: Dr. Johnney Killian  PCP: Heywood Bene, PA-C   Outpatient Specialists: [facility]  Patient coming from: Home  Chief Complaint: Altered mental status  HPI: Dana Jackson is a 74 y.o. female with medical history significant of chronic kidney disease stage V not yet on hemodialysis but dialysis catheter in place, hypertension, hyperlipidemia, previous CVA, renal cell carcinoma, diabetes, coronary artery disease status post coronary artery bypass graft, status post left nephrectomy who presented to the ER with altered mental status.  Husband is accompanying patient.  She has apparently been progressively getting confused but mainly in the last 3 days.  No obvious source of her confusion.  She was then in the ER with electrolyte abnormalities including hypokalemia.  Patient seems to be having acute metabolic encephalopathy.  At this point she is able to answer questions adequately.  Nephrology has been patient evaluated patient and intends to start hemodialysis as early as tomorrow.  Patient is therefore being admitted to the hospital for treatment..  ED Course: Temperature 98 for blood pressure 171/80 pulse 95 respirate of 17 oxygen sat 95% room air.  Sodium 138 potassium 5.7 chloride 107 CO2 24 with glucose 130.  BUN 42 creatinine 4.43 and calcium 9.5.  Albumin is 3.0, hemoglobin 9.9 otherwise CBC within normal.  Urinalysis showed no significant findings.  Head CT without contrast is negative.  Patient being admitted for acute metabolic encephalopathy for further work-up.  Review of Systems: As per HPI otherwise 10 point review of systems negative.    Past Medical History:  Diagnosis Date  . Anemia   . Arthritis    hands  . Asthma    childhood  . Bacteremia due to Escherichia coli June 2015    Currently being treated with anti-biotics  . CAD in native artery 12/2010     a) Cath for exertional angina & EKG changes: 40% LM, 80% mid LAD.  95% ost cX, 80-90% PDA --> CABG X3; b) Post CABG CATH for + Myoview with basal anterior ischemia -> Ost LAD 80% (diffuse) then 100% after SP2, RI 70% (too small for PCI), Ost-prox Cx 99% & OM1 90%, OstrPDA 80% (small); Occluded SVG-rPDA.  Patent LIMA-dLAD, SVG-OM: culprit ~ p-m LAD pre-LIMA & RI - not good PCI target --> Med Rx  . CKD (chronic kidney disease) stage 3, GFR 30-59 ml/min   . Degenerative disc disease, cervical    Degenerative disc disease, cervical [722.4]  . Diabetes mellitus without complication ([place])    Type II  . Endometrial cancer [facility]) November 2015   Treated with TAH with pelvic lymphadenectomy followed by radiation and chemotherapy  . GERD (gastroesophageal reflux disease)   . Hearing loss    bilateral - no hearing aids  . Heart murmur   . History of asthma     childhood  . History of blood transfusion   . History of pneumonia    "2-3 times"  . History of unstable angina November/December 2012   T wave inversions in inferolateral leads.  No stress test performed.  . Hyperlipidemia LDL goal <70   . Hypertension, essential, benign   . Neuropathy    hands  . Pneumonia 2018  . Renal cell carcinoma ([place]) 11/18/2008   T2aNx s/p partial left nephrectomy  . 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 Stroke [facility])    TIA- no residual   .  TIA (transient ischemic attack) 1992 &  2010    Past Surgical History:  Procedure Laterality Date  . ANTERIOR CERVICAL DECOMP/DISCECTOMY FUSION  10/11/2005   multi-level  . AV FISTULA PLACEMENT Left 08/03/2017   Procedure: ARTERIOVENOUS (AV) FISTULA CREATION LEFT ARM;  Surgeon: Rosetta Posner, MD;  Location: [place];  Service: Vascular;  Laterality: Left;  . CARDIAC CATHETERIZATION  12/24/10   40% left main, 80% mid LAD, 95% ostial circumflex, 80-90% PDA.  [name] CARDIAC CATHETERIZATION N/A 07/01/2014   Procedure: Left Heart Cath and Cors/Grafts  Angiography;  Surgeon: Leonie Man, MD;  Location: [facility] INVASIVE CV LAB:  For Abn Nuc @ [facility]: Ost LAD 80%, mid LAD 100% after S/P 2, 70% RI (no PCI target), ost-prox Cx 99%, OM1 90%. Ost rPDA 80% (~ pre-CABG), occluded SVG-rPDA, patent LIMA-dLAD, SVG OM; potential culprit for abn Nuc scan = pLAD Dz, small RI 70% or PDA -> med Rx  . CAROTID ENDARTERECTOMY Left   . CHOLECYSTECTOMY  ~ 1971  . COLONOSCOPY    . CORONARY ARTERY BYPASS GRAFT  12/25/2010   Procedure: CORONARY ARTERY BYPASS GRAFTING (CABGX3 - LIMA-LAD, SVG RPL, SVG-Circumflex);  Surgeon: Rexene Alberts, MD;  Location: [place];  Service: Open Heart Surgery;  Laterality: N/A;  . DILATATION & CURETTAGE/HYSTEROSCOPY WITH MYOSURE N/A 11/02/2013   Procedure: DILATATION & CURETTAGE/HYSTEROSCOPY WITH MYOSURE ABLATION;  Surgeon: Allena Katz, MD;  Location: [facility];  Service: Gynecology;  Laterality: N/A;  . DOPPLER ECHOCARDIOGRAPHY  12/08/2010   EF =>55%,MILD CONCENTRIC LEFT VENTRICULAR HYPERTROPHY  . EYE SURGERY Bilateral    Cataract  . LEFT HEART CATHETERIZATION WITH CORONARY ANGIOGRAM N/A 12/24/2010   Procedure: LEFT HEART CATHETERIZATION WITH CORONARY ANGIOGRAM;  Surgeon: Leonie Man, MD;  Location: [facility] CATH LAB;  Service: Cardiovascular;  Laterality: N/A;  . NM MYOCAR SINGLE W/SPECT  07/26/2007   EF 79%, LEFT VENT.FUNCTION NORMAL  . PARTIAL NEPHRECTOMY Left 11/18/2008   left partial nephrectomy for renal cell CA  . PET Myocardial Perfusion Scan  06/13/2014   At [place]: Moderate size, mild severity completely reversible defect involving the basal anterior, mid anterior and apical anterior segments consistent with ischemia. EF 65% with normal global function.  . TONSILLECTOMY     "in college"  . TOTAL ABDOMINAL HYSTERECTOMY  November 2015    At [facility]: Robotic procedure with pelvic lymphadenectomy  . TRANSTHORACIC ECHOCARDIOGRAM  06/13/2014   At [place]: EF 60-65%. Grade 1 diastolic dysfunction. Mild MR.  Aortic sclerosis. Moderate pulmonary hypertension  . TUBAL LIGATION  ~ 1984  . UPPER GI ENDOSCOPY    . WISDOM TOOTH EXTRACTION       reports that she has never smoked. She has never used smokeless tobacco. She reports previous alcohol use. She reports that she does not use drugs.  Allergies  Allergen Reactions  . Sulfa Antibiotics Other (See Comments)    "AKI"   . Erythromycin Itching  . Pravastatin Other (See Comments)    Used 01/2011 to 03/2011 (reaction unknown)  . Simvastatin Other (See Comments)    Reaction not known  . Codeine Nausea Only and Other (See Comments)    Reaction not known, but can tolerate Hycodan  . Gemfibrozil Itching    Family History  Problem Relation Age of Onset  . AAA (abdominal aortic aneurysm) Mother   . Multiple myeloma Mother   . GER disease Mother   . Coronary artery disease Father   . Heart disease Father   .  Coronary artery disease Sister   . Coronary artery disease Brother   . Heart disease Brother   . Stroke Maternal Grandmother   . Heart attack Neg Hx      Prior to Admission medications   Medication Sig Start Date End Date Taking? Authorizing Provider  Alpha-Lipoic Acid 600 MG CAPS Take 600 mg by mouth daily.   Yes [provider]  aspirin 81 MG tablet Take 81 mg by mouth at bedtime.    Yes [provider]  atorvastatin (LIPITOR) 40 MG tablet Take 40 mg by mouth at bedtime.    Yes [provider]  famotidine (PEPCID) 20 MG tablet Take 40 mg by mouth at bedtime.    Yes [provider]  furosemide (LASIX) 20 MG tablet Take 20 mg by mouth See admin instructions. Takes 1 tablet 2 times daily as needed on Tuesday and Friday.   Yes [provider]  gabapentin (NEURONTIN) 100 MG capsule Take 100 mg by mouth 3 (three) times daily.    Yes [provider]  isosorbide mononitrate (IMDUR) 60 MG 24 hr tablet Take 1 tablet (60 mg total) by mouth daily. 01/26/19 04/26/19 Yes Leonie Man, MD    loratadine (CLARITIN) 10 MG tablet Take 10 mg by mouth daily.   Yes [provider]  magnesium oxide (MAG-OX) 400 MG tablet Take 400 mg by mouth daily.   Yes [provider]  metoprolol tartrate (LOPRESSOR) 50 MG tablet Take 1 tablet (50 mg total) by mouth 2 (two) times daily. 02/27/19  Yes Leonie Man, MD  nitroGLYCERIN (NITROSTAT) 0.4 MG SL tablet PLACE 1 TABLET (0.4 MG TOTAL) UNDER THE TONGUE EVERY 5 (FIVE) MINUTES AS NEEDED FOR CHEST PAIN. Patient taking differently: Place 0.4 mg under the tongue every 5 (five) minutes as needed for chest pain.  01/30/19  Yes Leonie Man, MD  Omega-3 Fatty Acids (FISH OIL) 1200 MG CAPS Take 1,200 mg by mouth in the morning and at bedtime.    Yes [provider]  omeprazole (PRILOSEC) 40 MG capsule Take 40 mg by mouth daily before breakfast.    Yes [provider]  Probiotic Product (ALIGN) 4 MG CAPS Take 1 capsule by mouth daily.   Yes [provider]  pyridOXINE (VITAMIN B-6) 100 MG tablet Take 200 mg by mouth daily.    Yes [provider]    Physical Exam: Vitals:   04/20/19 1248 04/20/19 2000  BP: (!) 161/66 (!) 171/80  Pulse: 95   Resp: 15 17  Temp: 98.4 F (36.9 C)   TempSrc: Oral   SpO2: 95%       Constitutional: Awake now slightly drowsy Vitals:   04/20/19 1248 04/20/19 2000  BP: (!) 161/66 (!) 171/80  Pulse: 95   Resp: 15 17  Temp: 98.4 F (36.9 C)   TempSrc: Oral   SpO2: 95%    Eyes: PERRL, lids and conjunctivae normal ENMT: Mucous membranes are moist. Posterior pharynx clear of any exudate or lesions.Normal dentition.  Neck: normal, supple, no masses, no thyromegaly Respiratory: clear to auscultation bilaterally, no wheezing, no crackles. Normal respiratory effort. No accessory muscle use.  Cardiovascular: Regular rate and rhythm, no murmurs / rubs / gallops.  1+ extremity edema. 2+ pedal pulses. No carotid bruits.  Abdomen: no tenderness, no masses palpated. No  hepatosplenomegaly. Bowel sounds positive.  Musculoskeletal: no clubbing / cyanosis. No joint deformity upper and lower extremities. Good ROM, no contractures. Normal muscle tone.  Skin: no  rashes, lesions, ulcers. No induration Neurologic: CN 2-12 grossly intact. Sensation intact, DTR normal. Strength 5/5 in all 4.  Psychiatric: Drowsy but arousable   Labs on Admission: I have personally reviewed following labs and imaging studies  CBC: Recent Labs  Lab 04/20/19 1331  WBC 6.4  HGB 9.9*  HCT 30.7*  MCV 94.5  PLT 542   Basic Metabolic Panel: Recent Labs  Lab 04/20/19 1331  NA 138  K 5.7*  CL 107  CO2 24  GLUCOSE 130*  BUN 42*  CREATININE 4.43*  CALCIUM 9.5   GFR: Estimated Creatinine Clearance: 11.2 mL/min (A) (by C-G formula based on SCr of 4.43 mg/dL (H)). Liver Function Tests: Recent Labs  Lab 04/20/19 1331  AST 18  ALT 25  ALKPHOS 90  BILITOT 0.5  PROT 5.4*  ALBUMIN 3.0*   No results for input(s): LIPASE, AMYLASE in the last 168 hours. No results for input(s): AMMONIA in the last 168 hours. Coagulation Profile: No results for input(s): INR, PROTIME in the last 168 hours. Cardiac Enzymes: No results for input(s): CKTOTAL, CKMB, CKMBINDEX, TROPONINI in the last 168 hours. BNP (last 3 results) No results for input(s): PROBNP in the last 8760 hours. HbA1C: No results for input(s): HGBA1C in the last 72 hours. CBG: Recent Labs  Lab 04/20/19 1329  GLUCAP 124*   Lipid Profile: No results for input(s): CHOL, HDL, LDLCALC, TRIG, CHOLHDL, LDLDIRECT in the last 72 hours. Thyroid Function Tests: No results for input(s): TSH, T4TOTAL, FREET4, T3FREE, THYROIDAB in the last 72 hours. Anemia Panel: No results for input(s): VITAMINB12, FOLATE, FERRITIN, TIBC, IRON, RETICCTPCT in the last 72 hours. Urine analysis:    Component Value Date/Time   COLORURINE STRAW (A) 04/20/2019 1822   APPEARANCEUR CLEAR 04/20/2019 1822   LABSPEC 1.011 04/20/2019 1822   PHURINE  7.0 04/20/2019 1822   GLUCOSEU NEGATIVE 04/20/2019 1822   HGBUR NEGATIVE 04/20/2019 1822   BILIRUBINUR NEGATIVE 04/20/2019 1822   KETONESUR NEGATIVE 04/20/2019 1822   PROTEINUR >=300 (A) 04/20/2019 1822   UROBILINOGEN 0.2 06/10/2014 1259   NITRITE NEGATIVE 04/20/2019 1822   LEUKOCYTESUR SMALL (A) 04/20/2019 1822   Sepsis Labs: _0 (procalcitonin:4,lacticidven:4) )No results found for this or any previous visit (from the past 240 hour(s)).   Radiological Exams on Admission: CT Head Wo Contrast  Result Date: 04/20/2019 CLINICAL DATA:  Altered mental status EXAM: CT HEAD WITHOUT CONTRAST TECHNIQUE: Contiguous axial images were obtained from the base of the skull through the vertex without intravenous contrast. COMPARISON:  03/27/2019 FINDINGS: Brain: There is atrophy and chronic small vessel disease changes. No acute intracranial abnormality. Specifically, no hemorrhage, hydrocephalus, mass lesion, acute infarction, or significant intracranial injury. Vascular: No hyperdense vessel or unexpected calcification. Skull: No acute calvarial abnormality. Sinuses/Orbits: Visualized paranasal sinuses and mastoids clear. Orbital soft tissues unremarkable. Other: None IMPRESSION: Atrophy, chronic microvascular disease. No acute intracranial abnormality. Electronically Signed   By: Rolm Baptise M.D.   On: 04/20/2019 18:55     Assessment/Plan Principal Problem:   Delirium Active Problems:   Renal cell carcinoma (HCC)   Essential hypertension   S/P CABG x 3   Anemia   Hyperlipidemia LDL goal <70   Obesity (BMI 30-39.9)   Type 2 diabetes mellitus with renal manifestations, controlled (HCC)   Hyperkalemia   CAD (coronary artery disease) of artery bypass graft   Acute encephalopathy     #1 acute delirium: Most likely due to metabolic encephalopathy.  Patient having end-stage renal disease.  Probably this is contributing.  We will admit the patient.  Close neuro checks.  Nephrology consulted  for probably hemodialysis.  #2  Chronic kidney disease stage V: Patient is at end-stage.  We will defer to nephrology who plans to initiate hemodialysis tomorrow.  #3 possible UTI: Not impressive from the urinalysis.  Urine culture sent if positive initiate antibiotics.  #4 diabetes: Blood sugar is well controlled.  Initiate sliding scale insulin.  #5 coronary artery disease: No evidence of decompensation.  Placed on telemetric and continue to monitor.  #6 hypertension: Continue home regimen and monitor.  #7 hyperkalemia: Patient treated in the ER and will have hemodialysis tomorrow.  Continue monitor.    DVT prophylaxis: Heparin Code Status: Full code Family Communication: Husband at bedside Disposition Plan: Home Consults called: [facility] Admission status: Inpatient  Severity of Illness: The appropriate patient status for this patient is INPATIENT. Inpatient status is judged to be reasonable and necessary in order to provide the required intensity of service to ensure the patient's safety. The patient's presenting symptoms, physical exam findings, and initial radiographic and laboratory data in the context of their chronic comorbidities is felt to place them at high risk for further clinical deterioration. Furthermore, it is not anticipated that the patient will be medically stable for discharge from the hospital within 2 midnights of admission. The following factors support the patient status of inpatient.   " The patient's presenting symptoms include altered mental status. " The worrisome physical exam findings include confusion. " The initial radiographic and laboratory data are worrisome because of hypokalemia. " The chronic co-morbidities include chronic kidney disease stage V.   * I certify that at the point of admission it is my clinical judgment that the patient will require inpatient hospital care spanning beyond 2 midnights from the point of admission due  to high intensity of service, high risk for further deterioration and high frequency of surveillance required.Barbette Merino MD [facility] Pager [phone]  If 7PM-7AM, please contact night-coverage [email or web address] Password [facility]  04/20/2019, 9:35 PM

## 2019-04-20 NOTE — Progress Notes (Signed)
Renal Navigator contacted by Renal PA from [place] office, who reports patient has been sent to ER for work. Plan is ultimately HHD. Navigator will follow up with patient and husband on Monday in order to refer for OP HD in order for patient to establish care in a clinic so she can be trained in [employer] while she is initiated on HD.  Alphonzo Cruise, [place] Renal Navigator [phone]

## 2019-04-20 NOTE — ED Triage Notes (Signed)
Pt accompanied by husband who reports AMS for the past 3 days. Pt seen at [facility] today and was told she was close to starting dialysis. Pt alert, oriented to self only.

## 2019-04-20 NOTE — Consult Note (Addendum)
[facility] Nephrology Consultation Note  Requesting MD: Dr Sherwood Gambler Reason for consult: CKD5  HPI:  Dana Jackson is a 74 y.o. female with history of hypertension, HLD, CAD, DM, endometrial cancer, CKD 5 follows with Dr. Joelyn Oms, sent from [place] office for the evaluation of confusion seen  for the management of CKD 5 and possible uremia. Patient recently had multiple hospitalization for UTI, encephalopathy.  She was discharged on 3/19 when work-up for confusion including UTI was unremarkable.  She was treated with antibiotics empirically.  Today, patient was seen at [place] [facility] when she was noted t have worsening confusion.  Reportedly some jerky movement and tremors at home. She has matured left brachiocephalic AV fistula which was created by Dr. Donnetta Hutching on 08/03/2017.  The original plan was to start home training for home hemodialysis next week however given worsening confusion she was sent to ER to start dialysis.  Her recent creatinine level seems to be around 3.7-4.0.    As per patient's husband, her confusion has been worsening to the point that she has difficulty recognize her husband and sometimes calling her father's name who passed away more than 8 years ago.  In the ER, she is hypertensive with blood pressure around 867-672C systolic, in room air.  The lab showed potassium 5.7, CO2 24, BUN 42, creatinine level 4.4, hemoglobin 9.9. The patient was alert awake and oriented to her name and hospital only.  She was not able to tell me her date of birth however mention that this is 2022.  She reports nausea however no vomiting, chest pain, shortness of breath.   PMHx:   Past Medical History:  Diagnosis Date  . Anemia   . Arthritis    hands  . Asthma    childhood  . Bacteremia due to Escherichia coli June 2015    Currently being treated with anti-biotics  . CAD in native artery 12/2010   a) Cath for exertional angina & EKG changes: 40% LM, 80% mid LAD.   95% ost cX, 80-90% PDA --> CABG X3; b) Post CABG CATH for + Myoview with basal anterior ischemia -> Ost LAD 80% (diffuse) then 100% after SP2, RI 70% (too small for PCI), Ost-prox Cx 99% & OM1 90%, OstrPDA 80% (small); Occluded SVG-rPDA.  Patent LIMA-dLAD, SVG-OM: culprit ~ p-m LAD pre-LIMA & RI - not good PCI target --> Med Rx  . CKD (chronic kidney disease) stage 3, GFR 30-59 ml/min   . Degenerative disc disease, cervical    Degenerative disc disease, cervical [722.4]  . Diabetes mellitus without complication ([place])    Type II  . Endometrial cancer [facility]) November 2015   Treated with TAH with pelvic lymphadenectomy followed by radiation and chemotherapy  . GERD (gastroesophageal reflux disease)   . Hearing loss    bilateral - no hearing aids  . Heart murmur   . History of asthma     childhood  . History of blood transfusion   . History of pneumonia    "2-3 times"  . History of unstable angina November/December 2012   T wave inversions in inferolateral leads.  No stress test performed.  . Hyperlipidemia LDL goal <70   . Hypertension, essential, benign   . Neuropathy    hands  . Pneumonia 2018  . Renal cell carcinoma ([place]) 11/18/2008   T2aNx s/p partial left nephrectomy  . 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 Stroke [facility])  TIA- no residual   . TIA (transient ischemic attack) 1992 &  2010    Past Surgical History:  Procedure Laterality Date  . ANTERIOR CERVICAL DECOMP/DISCECTOMY FUSION  10/11/2005   multi-level  . AV FISTULA PLACEMENT Left 08/03/2017   Procedure: ARTERIOVENOUS (AV) FISTULA CREATION LEFT ARM;  Surgeon: Rosetta Posner, MD;  Location: [place];  Service: Vascular;  Laterality: Left;  . CARDIAC CATHETERIZATION  12/24/10   40% left main, 80% mid LAD, 95% ostial circumflex, 80-90% PDA.  [name] CARDIAC CATHETERIZATION N/A 07/01/2014   Procedure: Left Heart Cath and Cors/Grafts Angiography;  Surgeon: Leonie Man, MD;  Location: [facility] INVASIVE CV LAB:   For Abn Nuc @ [facility]: Ost LAD 80%, mid LAD 100% after S/P 2, 70% RI (no PCI target), ost-prox Cx 99%, OM1 90%. Ost rPDA 80% (~ pre-CABG), occluded SVG-rPDA, patent LIMA-dLAD, SVG OM; potential culprit for abn Nuc scan = pLAD Dz, small RI 70% or PDA -> med Rx  . CAROTID ENDARTERECTOMY Left   . CHOLECYSTECTOMY  ~ 1971  . COLONOSCOPY    . CORONARY ARTERY BYPASS GRAFT  12/25/2010   Procedure: CORONARY ARTERY BYPASS GRAFTING (CABGX3 - LIMA-LAD, SVG RPL, SVG-Circumflex);  Surgeon: Rexene Alberts, MD;  Location: [place];  Service: Open Heart Surgery;  Laterality: N/A;  . DILATATION & CURETTAGE/HYSTEROSCOPY WITH MYOSURE N/A 11/02/2013   Procedure: DILATATION & CURETTAGE/HYSTEROSCOPY WITH MYOSURE ABLATION;  Surgeon: Allena Katz, MD;  Location: [facility];  Service: Gynecology;  Laterality: N/A;  . DOPPLER ECHOCARDIOGRAPHY  12/08/2010   EF =>55%,MILD CONCENTRIC LEFT VENTRICULAR HYPERTROPHY  . EYE SURGERY Bilateral    Cataract  . LEFT HEART CATHETERIZATION WITH CORONARY ANGIOGRAM N/A 12/24/2010   Procedure: LEFT HEART CATHETERIZATION WITH CORONARY ANGIOGRAM;  Surgeon: Leonie Man, MD;  Location: [facility] CATH LAB;  Service: Cardiovascular;  Laterality: N/A;  . NM MYOCAR SINGLE W/SPECT  07/26/2007   EF 79%, LEFT VENT.FUNCTION NORMAL  . PARTIAL NEPHRECTOMY Left 11/18/2008   left partial nephrectomy for renal cell CA  . PET Myocardial Perfusion Scan  06/13/2014   At [place]: Moderate size, mild severity completely reversible defect involving the basal anterior, mid anterior and apical anterior segments consistent with ischemia. EF 65% with normal global function.  . TONSILLECTOMY     "in college"  . TOTAL ABDOMINAL HYSTERECTOMY  November 2015    At [facility]: Robotic procedure with pelvic lymphadenectomy  . TRANSTHORACIC ECHOCARDIOGRAM  06/13/2014   At [place]: EF 60-65%. Grade 1 diastolic dysfunction. Mild MR. Aortic sclerosis. Moderate pulmonary hypertension  . TUBAL LIGATION  ~ 1984   . UPPER GI ENDOSCOPY    . WISDOM TOOTH EXTRACTION      Family Hx:  Family History  Problem Relation Age of Onset  . AAA (abdominal aortic aneurysm) Mother   . Multiple myeloma Mother   . GER disease Mother   . Coronary artery disease Father   . Heart disease Father   . Coronary artery disease Sister   . Coronary artery disease Brother   . Heart disease Brother   . Stroke Maternal Grandmother   . Heart attack Neg Hx     Social History:  reports that she has never smoked. She has never used smokeless tobacco. She reports previous alcohol use. She reports that she does not use drugs.  Allergies:  Allergies  Allergen Reactions  . Sulfa Antibiotics Other (See Comments)    "AKI"   . Erythromycin Itching  . Pravastatin Other (See  Comments)    Used 01/2011 to 03/2011 (reaction unknown)  . Simvastatin Other (See Comments)    Reaction not known  . Codeine Nausea Only and Other (See Comments)    Reaction not known, but can tolerate Hycodan  . Gemfibrozil Itching    Medications: Prior to Admission medications   Medication Sig Start Date End Date Taking? Authorizing Provider  Alpha-Lipoic Acid 600 MG CAPS Take 600 mg by mouth daily.    [provider]  aspirin 81 MG tablet Take 81 mg by mouth at bedtime.     [provider]  atorvastatin (LIPITOR) 40 MG tablet Take 40 mg by mouth at bedtime.     [provider]  calcitRIOL (ROCALTROL) 0.25 MCG capsule Take 0.25 mcg by mouth daily with breakfast.    [provider]  famotidine (PEPCID) 20 MG tablet Take 40 mg by mouth daily before breakfast.     [provider]  gabapentin (NEURONTIN) 100 MG capsule Take 100 mg by mouth 3 (three) times daily.     [provider]  glipiZIDE (GLUCOTROL) 5 MG tablet Take 2.5 mg by mouth 2 (two) times daily before a meal.     [provider]  HYDROcodone-homatropine (HYCODAN) 5-1.5 MG/5ML syrup Take 5 mLs by mouth every 6 (six) hours as  needed for cough.  05/06/17   [provider]  isosorbide mononitrate (IMDUR) 60 MG 24 hr tablet Take 1 tablet (60 mg total) by mouth daily. 01/26/19 04/26/19  Leonie Man, MD  loperamide (IMODIUM) 2 MG capsule Take 2 mg by mouth 4 (four) times daily as needed for diarrhea or loose stools.    [provider]  loratadine (CLARITIN) 10 MG tablet Take 10 mg by mouth daily.    [provider]  magnesium oxide (MAG-OX) 400 MG tablet Take 400 mg by mouth daily.    [provider]  metoprolol tartrate (LOPRESSOR) 50 MG tablet Take 1 tablet (50 mg total) by mouth 2 (two) times daily. 02/27/19   Leonie Man, MD  nitroGLYCERIN (NITROSTAT) 0.4 MG SL tablet PLACE 1 TABLET (0.4 MG TOTAL) UNDER THE TONGUE EVERY 5 (FIVE) MINUTES AS NEEDED FOR CHEST PAIN. Patient taking differently: Place 0.4 mg under the tongue every 5 (five) minutes as needed for chest pain.  01/30/19   Leonie Man, MD  Omega-3 Fatty Acids (FISH OIL) 1200 MG CAPS Take 1,200 mg by mouth in the morning and at bedtime.     [provider]  omeprazole (PRILOSEC) 40 MG capsule Take 40 mg by mouth daily before breakfast.     [provider]  PROAIR HFA 108 (90 Base) MCG/ACT inhaler Inhale 2 puffs into the lungs every 6 (six) hours as needed for wheezing or shortness of breath.  05/09/17   [provider]  Probiotic Product (ALIGN) 4 MG CAPS Take 4 mg by mouth daily.    [provider]  promethazine (PHENERGAN) 25 MG tablet Take 25 mg by mouth every 6 (six) hours as needed for nausea or vomiting.     [provider]  pyridOXINE (VITAMIN B-6) 100 MG tablet Take 200 mg by mouth daily.     [provider]    I have reviewed the patient's current medications.  Labs:  Results for orders placed or performed during the hospital encounter of 04/20/19 (from the past 48 hour(s))  CBG monitoring, ED     Status: Abnormal   Collection Time: 04/20/19  1:29 PM  Result Value Ref Range   Glucose-Capillary 124 (H) 70 - 99 mg/dL    Comment: Glucose reference range applies only to samples taken after fasting for at least 8 hours.  Comprehensive metabolic panel     Status: Abnormal   Collection Time: 04/20/19  1:31 PM  Result Value Ref Range   Sodium 138 135 - 145 mmol/L   Potassium 5.7 (H) 3.5 - 5.1 mmol/L   Chloride 107 98 - 111 mmol/L   CO2 24 22 - 32 mmol/L   Glucose, Bld 130 (H) 70 - 99 mg/dL    Comment: Glucose reference range applies only to samples taken after fasting for at least 8 hours.   BUN 42 (H) 8 - 23 mg/dL   Creatinine, Ser 4.43 (H) 0.44 - 1.00 mg/dL   Calcium 9.5 8.9 - 10.3 mg/dL   Total Protein 5.4 (L) 6.5 - 8.1 g/dL   Albumin 3.0 (L) 3.5 - 5.0 g/dL   AST 18 15 - 41 U/L   ALT 25 0 - 44 U/L   Alkaline Phosphatase 90 38 - 126 U/L   Total Bilirubin 0.5 0.3 - 1.2 mg/dL   GFR calc non Af Amer 9 (L) >60 mL/min   GFR calc Af Amer 11 (L) >60 mL/min   Anion gap 7 5 - 15    Comment: Performed at [facility] [address], [place], [place]  [address]  CBC     Status: Abnormal   Collection Time: 04/20/19  1:31 PM  Result Value Ref Range   WBC 6.4 4.0 - 10.5 K/uL   RBC 3.25 (L) 3.87 - 5.11 MIL/uL   Hemoglobin 9.9 (L) 12.0 - 15.0 g/dL   HCT 30.7 (L) 36.0 - 46.0 %   MCV 94.5 80.0 - 100.0 fL   MCH 30.5 26.0 - 34.0 pg   MCHC 32.2 30.0 - 36.0 g/dL   RDW 12.7 11.5 - 15.5 %   Platelets 202 150 - 400 K/uL   nRBC 0.0 0.0 - 0.2 %    Comment: Performed at [facility], [place] [address], [place], [place] [address]     ROS:  Pertinent items noted in HPI and remainder of comprehensive ROS otherwise negative.  Physical Exam: Vitals:   04/20/19 1248  BP: (!) 161/66  Pulse: 95  Resp: 15  Temp: 98.4 F (36.9 C)  SpO2: 95%     General exam: Appears calm and comfortable  Respiratory system: Clear to auscultation. Respiratory effort normal. No wheezing or crackle Cardiovascular system: S1 & S2 heard, RRR.  No pedal  edema. Gastrointestinal system: Abdomen is nondistended, soft and nontender. Normal bowel sounds heard. Central nervous system: Alert, awake but confused.  No tremor noted. Extremities: Symmetric 5 x 5 power. Skin: No rashes, lesions or ulcers Psychiatry: Judgement and insight appear impaired. Vascular: LUE AVF has good bruit+, thrill+, looks matured.  Assessment/Plan:  #Confusion/acute encephalopathy: Patient with CKD 5 and concern for uremic encephalopathy however her BUN level is just mildly elevated.  She probably has underlying dementia.  ER is planning CT head.  Recommend to check urinalysis.  Given her frequent hospitalization with similar presentation, we will initiate hemodialysis to see if this helps her mental status.  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  Plan to initiate dialysis tomorrow.  I have discussed this with the patient and her husband who agrees with the plan.  Renal navigator consulted to arrange OP HD unit.  Subsequently, the plan is to transition to home hemodialysis.  #Hyperkalemia: Order a dose of Lokelma.  Will be managed with dialysis.  #Hypertension: Resume home medication.  Blood pressure expect to improve with dialysis.  #Anemia of CKD: Hemoglobin below goal.  I will check iron studies.  May need ESA.  #Secondary hyperparathyroidism: Check PTH, phosphorus level.  I have related the information to ER physician and discussed the plan of care with Dr. Regenia Skeeter.  Dalin Caldera Tanna Furry 04/20/2019, 5:05 PM  [facility].

## 2019-04-20 NOTE — ED Provider Notes (Signed)
[facility] EMERGENCY DEPARTMENT Provider Note   CSN: [id number] Arrival date & time: 04/20/19  1237     History Chief Complaint  Patient presents with  . Altered Mental Status    Dana Jackson is a 74 y.o. female.  The history is provided by the patient, the spouse and medical records.      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  Patient was seen at the [place] [facility] where she is currently patient in process of preplanning for hemodialysis and she was referred here with concerns for altered mental status.  Per the patient's husband he reports that the patient has been seeing people who are not there in the house reporting that there is a second version of himself per the patient who is been giving her medications.  She seemed more confused over the past several days.  Patient has not had any recent fevers, chills, cough, congestion, rhinorrhea, chest pain, shortness of breath, headaches, vision changes, abdominal pain, changes in bowel or bladder function.  The patient still makes some urine.  Patient does report some mild swelling in her ankles.  Patient has not had any speech difficulties, focal weakness on one side or the other, facial droop or word finding difficulties.  Past Medical History:  Diagnosis Date  . Anemia   . Arthritis    hands  . Asthma    childhood  . Bacteremia due to Escherichia coli June 2015    Currently being treated with anti-biotics  . CAD in native artery 12/2010   a) Cath for exertional angina & EKG changes: 40% LM, 80% mid LAD.  95% ost cX, 80-90% PDA --> CABG X3; b) Post CABG CATH for + Myoview with  basal anterior ischemia -> Ost LAD 80% (diffuse) then 100% after SP2, RI 70% (too small for PCI), Ost-prox Cx 99% & OM1 90%, OstrPDA 80% (small); Occluded SVG-rPDA.  Patent LIMA-dLAD, SVG-OM: culprit ~ p-m LAD pre-LIMA & RI - not good PCI target --> Med Rx  . CKD (chronic kidney disease) stage 3, GFR 30-59 ml/min   . Degenerative disc disease, cervical    Degenerative disc disease, cervical [722.4]  . Diabetes mellitus without complication ([place])    Type II  . Endometrial cancer [facility]) November 2015   Treated with TAH with pelvic lymphadenectomy followed by radiation and chemotherapy  . GERD (gastroesophageal reflux disease)   . Hearing loss    bilateral - no hearing aids  . Heart murmur   . History of asthma     childhood  . History of blood transfusion   . History of pneumonia    "2-3 times"  . History of unstable angina November/December 2012   T wave inversions in inferolateral leads.  No stress test performed.  . Hyperlipidemia LDL goal <70   . Hypertension, essential, benign   . Neuropathy    hands  . Pneumonia 2018  . Renal cell carcinoma ([place]) 11/18/2008   T2aNx s/p partial left nephrectomy  . 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 Stroke [facility])    TIA- no residual   . TIA (transient ischemic attack) 1992 &  2010    Patient Active Problem List   Diagnosis Date Noted  . CKD (chronic kidney disease) stage 5, GFR less than 15 ml/min (HCC) 03/27/2019  . Fever 03/27/2019  . Acute encephalopathy 05/09/2017  . Coronary artery disease involving native coronary artery of native heart with angina pectoris ([place]) 09/08/2014  . Acute-on-chronic kidney injury ([place]) 07/29/2014  . Hyperkalemia 07/29/2014  . CAD (coronary artery disease) of artery bypass graft 07/29/2014  . Type 2 diabetes mellitus with renal manifestations, controlled ([place]) 07/10/2014  . Endometrial cancer ([place]) 07/10/2014  . Obesity (BMI 30-39.9) 09/03/2012  . Hyperlipidemia LDL goal <70     . Anemia 12/28/2010  . S/P CABG x 3 12/25/2010  . Renal cell carcinoma ([place]) 12/23/2010    Class: History of  . Essential hypertension 12/23/2010    Class: History of    Past Surgical History:  Procedure Laterality Date  . ANTERIOR CERVICAL DECOMP/DISCECTOMY FUSION  10/11/2005   multi-level  . AV FISTULA PLACEMENT Left 08/03/2017   Procedure: ARTERIOVENOUS (AV) FISTULA CREATION LEFT ARM;  Surgeon: Rosetta Posner, MD;  Location: [facility];  Service: Vascular;  Laterality: Left;  . CARDIAC CATHETERIZATION  12/24/10   40% left main, 80% mid LAD, 95% ostial circumflex, 80-90% PDA.  [name] CARDIAC CATHETERIZATION N/A 07/01/2014   Procedure: Left Heart Cath and Cors/Grafts Angiography;  Surgeon: Leonie Man, MD;  Location: [facility] INVASIVE CV LAB:  For Abn Nuc @ [facility]: Ost LAD 80%, mid LAD 100% after S/P 2, 70% RI (no PCI target), ost-prox Cx 99%, OM1 90%. Ost rPDA 80% (~ pre-CABG), occluded SVG-rPDA, patent LIMA-dLAD, SVG OM; potential culprit for abn Nuc scan = pLAD Dz, small RI 70% or PDA -> med Rx  . CAROTID ENDARTERECTOMY Left   . CHOLECYSTECTOMY  ~ 1971  . COLONOSCOPY    . CORONARY ARTERY BYPASS GRAFT  12/25/2010   Procedure: CORONARY ARTERY BYPASS GRAFTING (CABGX3 - LIMA-LAD, SVG RPL, SVG-Circumflex);  Surgeon: Rexene Alberts, MD;  Location: [place];  Service: Open Heart Surgery;  Laterality: N/A;  . DILATATION & CURETTAGE/HYSTEROSCOPY WITH MYOSURE N/A 11/02/2013   Procedure: DILATATION & CURETTAGE/HYSTEROSCOPY WITH MYOSURE ABLATION;  Surgeon: Allena Katz, MD;  Location: [facility];  Service: Gynecology;  Laterality: N/A;  . DOPPLER ECHOCARDIOGRAPHY  12/08/2010   EF =>55%,MILD CONCENTRIC LEFT VENTRICULAR HYPERTROPHY  . EYE SURGERY Bilateral    Cataract  . LEFT HEART CATHETERIZATION WITH CORONARY ANGIOGRAM N/A 12/24/2010   Procedure: LEFT HEART CATHETERIZATION WITH CORONARY ANGIOGRAM;  Surgeon: Leonie Man, MD;  Location: [facility] CATH LAB;  Service: Cardiovascular;  Laterality: N/A;  . NM MYOCAR  SINGLE W/SPECT  07/26/2007   EF 79%, LEFT VENT.FUNCTION NORMAL  . PARTIAL NEPHRECTOMY Left 11/18/2008   left partial nephrectomy for renal cell CA  . PET Myocardial Perfusion Scan  06/13/2014   At [place]: Moderate size, mild severity completely reversible defect involving the basal anterior, mid anterior and apical anterior segments consistent with ischemia. EF 65% with normal global function.  . TONSILLECTOMY     "in college"  . TOTAL ABDOMINAL HYSTERECTOMY  November 2015    At [facility]: Robotic procedure with pelvic lymphadenectomy  . TRANSTHORACIC ECHOCARDIOGRAM  06/13/2014   At [place] [facility]: EF 60-65%. Grade 1 diastolic dysfunction. Mild MR. Aortic sclerosis. Moderate pulmonary hypertension  . TUBAL LIGATION  ~ 1984  . UPPER GI ENDOSCOPY    . WISDOM TOOTH EXTRACTION       OB History   No obstetric  history on file.     Family History  Problem Relation Age of Onset  . AAA (abdominal aortic aneurysm) Mother   . Multiple myeloma Mother   . GER disease Mother   . Coronary artery disease Father   . Heart disease Father   . Coronary artery disease Sister   . Coronary artery disease Brother   . Heart disease Brother   . Stroke Maternal Grandmother   . Heart attack Neg Hx     Social History   Tobacco Use  . Smoking status: Never Smoker  . Smokeless tobacco: Never Used  Substance Use Topics  . Alcohol use: Not Currently    Alcohol/week: 0.0 standard drinks  . Drug use: No    Home Medications Prior to Admission medications   Medication Sig Start Date End Date Taking? Authorizing Provider  Alpha-Lipoic Acid 600 MG CAPS Take 600 mg by mouth daily.   Yes [provider]  aspirin 81 MG tablet Take 81 mg by mouth at bedtime.    Yes [provider]  atorvastatin (LIPITOR) 40 MG tablet Take 40 mg by mouth at bedtime.    Yes [provider]  famotidine (PEPCID) 20 MG tablet Take 40 mg by mouth at bedtime.    Yes [provider]   furosemide (LASIX) 20 MG tablet Take 20 mg by mouth See admin instructions. Takes 1 tablet 2 times daily as needed on Tuesday and Friday.   Yes [provider]  gabapentin (NEURONTIN) 100 MG capsule Take 100 mg by mouth 3 (three) times daily.    Yes [provider]  isosorbide mononitrate (IMDUR) 60 MG 24 hr tablet Take 1 tablet (60 mg total) by mouth daily. 01/26/19 04/26/19 Yes Leonie Man, MD  loratadine (CLARITIN) 10 MG tablet Take 10 mg by mouth daily.   Yes [provider]  magnesium oxide (MAG-OX) 400 MG tablet Take 400 mg by mouth daily.   Yes [provider]  metoprolol tartrate (LOPRESSOR) 50 MG tablet Take 1 tablet (50 mg total) by mouth 2 (two) times daily. 02/27/19  Yes Leonie Man, MD  nitroGLYCERIN (NITROSTAT) 0.4 MG SL tablet PLACE 1 TABLET (0.4 MG TOTAL) UNDER THE TONGUE EVERY 5 (FIVE) MINUTES AS NEEDED FOR CHEST PAIN. Patient taking differently: Place 0.4 mg under the tongue every 5 (five) minutes as needed for chest pain.  01/30/19  Yes Leonie Man, MD  Omega-3 Fatty Acids (FISH OIL) 1200 MG CAPS Take 1,200 mg by mouth in the morning and at bedtime.    Yes [provider]  omeprazole (PRILOSEC) 40 MG capsule Take 40 mg by mouth daily before breakfast.    Yes [provider]  Probiotic Product (ALIGN) 4 MG CAPS Take 1 capsule by mouth daily.   Yes [provider]  pyridOXINE (VITAMIN B-6) 100 MG tablet Take 200 mg by mouth daily.    Yes [provider]    Allergies    Sulfa antibiotics, Erythromycin, Pravastatin, Simvastatin, Codeine, and Gemfibrozil  Review of Systems   Review of Systems  Constitutional: Negative for activity change, appetite change, chills, diaphoresis, fatigue and fever.  HENT: Negative for congestion and rhinorrhea.   Respiratory: Negative for cough, shortness of breath and wheezing.   Cardiovascular: Positive for leg swelling. Negative for chest pain.  Gastrointestinal:  Negative for abdominal distention, abdominal pain, diarrhea, nausea and vomiting.  Genitourinary: Negative for decreased urine volume, difficulty urinating, dysuria, frequency and urgency.  Musculoskeletal: Negative for gait problem.  Skin: Negative for color change and wound.  Neurological: Negative for dizziness, syncope, facial asymmetry, weakness, light-headedness, numbness and headaches.  Psychiatric/Behavioral: Positive for confusion.  All other systems reviewed and are negative.   Physical Exam Updated Vital Signs BP (!) 161/66 (BP Location: Right Arm)   Pulse 95   Temp 98.4 F (36.9 C) (Oral)   Resp 15   SpO2 95%   Physical Exam Vitals and nursing note reviewed.  Constitutional:      General: She is not in acute distress.    Appearance: Normal appearance. She is normal weight. She is not ill-appearing.  HENT:     Head: Normocephalic.     Right Ear: External ear normal.     Left Ear: External ear normal.     Nose: Nose normal.     Mouth/Throat:     Mouth: Mucous membranes are moist.     Pharynx: Oropharynx is clear.  Eyes:     Extraocular Movements: Extraocular movements intact.  Cardiovascular:     Rate and Rhythm: Normal rate and regular rhythm.     Pulses: Normal pulses.     Heart sounds: Normal heart sounds.     Comments: Midline sternotomy scar from previous CABG appears well-healed Pulmonary:     Effort: Pulmonary effort is normal. No respiratory distress.     Breath sounds: Normal breath sounds. No wheezing or rhonchi.  Abdominal:     General: Bowel sounds are normal.     Palpations: Abdomen is soft.     Tenderness: There is no abdominal tenderness. There is no guarding.  Musculoskeletal:     Cervical back: Normal range of motion.     Right lower leg: Edema present.     Left lower leg: Edema present.     Comments: Left upper extremity AV fistula with palpable bruit, 1+ pitting edema to the bilateral ankles  Skin:    General: Skin is warm and dry.      Capillary Refill: Capillary refill takes less than 2 seconds.  Neurological:     General: No focal deficit present.     Mental Status: She is alert and oriented to person, place, and time. Mental status is at baseline.     Cranial Nerves: No cranial nerve deficit.     Sensory: No sensory deficit.     Motor: No weakness.  Psychiatric:        Mood and Affect: Mood normal.     ED Results / Procedures / Treatments   Labs (all labs ordered are listed, but only abnormal results are displayed) Labs Reviewed  COMPREHENSIVE METABOLIC PANEL - Abnormal; Notable for the following components:      Result Value   Potassium 5.7 (*)    Glucose, Bld 130 (*)    BUN 42 (*)    Creatinine, Ser 4.43 (*)    Total Protein 5.4 (*)    Albumin 3.0 (*)    GFR calc non Af Amer 9 (*)    GFR calc Af Amer 11 (*)    All other components within normal limits  CBC - Abnormal; Notable for the following components:   RBC 3.25 (*)    Hemoglobin 9.9 (*)    HCT 30.7 (*)    All other components within normal limits  URINALYSIS, ROUTINE W REFLEX MICROSCOPIC - Abnormal; Notable for the following components:   Color, Urine STRAW (*)    Protein, ur >=300 (*)    Leukocytes,Ua SMALL (*)    Bacteria, UA  RARE (*)    All other components within normal limits  CBG MONITORING, ED - Abnormal; Notable for the following components:   Glucose-Capillary 124 (*)    All other components within normal limits  IRON AND TIBC  FERRITIN  RENAL FUNCTION PANEL  PARATHYROID HORMONE, INTACT (NO CA)  HEPATITIS B SURFACE ANTIGEN    EKG None  Radiology CT Head Wo Contrast  Result Date: 04/20/2019 CLINICAL DATA:  Altered mental status EXAM: CT HEAD WITHOUT CONTRAST TECHNIQUE: Contiguous axial images were obtained from the base of the skull through the vertex without intravenous contrast. COMPARISON:  03/27/2019 FINDINGS: Brain: There is atrophy and chronic small vessel disease changes. No acute intracranial abnormality. Specifically,  no hemorrhage, hydrocephalus, mass lesion, acute infarction, or significant intracranial injury. Vascular: No hyperdense vessel or unexpected calcification. Skull: No acute calvarial abnormality. Sinuses/Orbits: Visualized paranasal sinuses and mastoids clear. Orbital soft tissues unremarkable. Other: None IMPRESSION: Atrophy, chronic microvascular disease. No acute intracranial abnormality. Electronically Signed   By: Rolm Baptise M.D.   On: 04/20/2019 18:55    Procedures Procedures (including critical care time)  Medications Ordered in ED Medications  sodium chloride flush (NS) 0.9 % injection 3 mL (has no administration in time range)  Chlorhexidine Gluconate Cloth 2 % PADS 6 each (has no administration in time range)  calcitRIOL (ROCALTROL) capsule 0.25 mcg (0.25 mcg Oral Given 04/20/19 2006)  sodium zirconium cyclosilicate (LOKELMA) packet 10 g (10 g Oral Given 04/20/19 1802)    ED Course  I have reviewed the triage vital signs and the nursing notes.  Pertinent labs & imaging results that were available during my care of the patient were reviewed by me and considered in my medical decision making (see chart for details).    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   Differential diagnoses considered include uremic encephalopathy, toxic metabolic encephalopathy, low suspicion for acute central process including CVA, possible decompensated or new onset dementia, low suspicion for an infectious process given no localizing symptoms  CT head with atrophy and chronic microvascular disease but no acute intracranial  abnormalities  Labs demonstrated mild hyperkalemia without overt EKG changes 5.7, BUN elevated 42 with a creatinine of 4.43 which is slightly increased from previous of 3.95, mild hypoalbuminemia to 3.0, normocytic anemia with a hemoglobin of 9.9 and MCV of 94.5 without leukocytosis, platelets 202, anemia likely secondary to renal malfunction appears to have been chronically diminished, urinalysis overall unremarkable with 300+ protein and small leukocytes but no nitrites and rare bacteria, without additional symptoms suggestive of UTI my suspicion is very low.  Upon reassessment patient remains oriented to self, place, time pleasantly comfortable sitting in hospital stretcher. Hypertension mildly proved without intervention.  Given the above findings, my suspicion is that patient may have Dr. Metabolic encephalopathy versus decompensation secondary to acute on chronic renal failure or progressively worsening dementia though appears AMS is only on going for the past 3 days and may be premature to make this diagnosis.  We will admit to hospitalist for ongoing evaluation, monitoring, management coordination of care  The  plan for this patient was discussed with Dr. Regenia Skeeter, who voiced agreement and who oversaw evaluation and treatment of this patient.  Final Clinical Impression(s) / ED Diagnoses Final diagnoses:  Altered mental status, unspecified altered mental status type    Rx / DC Orders ED Discharge Orders    None       Filbert Berthold, MD 04/20/19 2116    Sherwood Gambler, MD 04/23/19 1710

## 2019-04-21 DIAGNOSIS — R4182 Altered mental status, unspecified: Secondary | ICD-10-CM | POA: Diagnosis not present

## 2019-04-21 LAB — COMPREHENSIVE METABOLIC PANEL
ALT: 22 U/L (ref 0–44)
AST: 16 U/L (ref 15–41)
Albumin: 2.8 g/dL — ABNORMAL LOW (ref 3.5–5.0)
Alkaline Phosphatase: 82 U/L (ref 38–126)
Anion gap: 9 (ref 5–15)
BUN: 43 mg/dL — ABNORMAL HIGH (ref 8–23)
CO2: 25 mmol/L (ref 22–32)
Calcium: 9.4 mg/dL (ref 8.9–10.3)
Chloride: 106 mmol/L (ref 98–111)
Creatinine, Ser: 4.37 mg/dL — ABNORMAL HIGH (ref 0.44–1.00)
GFR calc Af Amer: 11 mL/min — ABNORMAL LOW (ref >60)
GFR calc non Af Amer: 9 mL/min — ABNORMAL LOW (ref >60)
Glucose, Bld: 98 mg/dL (ref 70–99)
Potassium: 4.4 mmol/L (ref 3.5–5.1)
Sodium: 140 mmol/L (ref 135–145)
Total Bilirubin: 0.6 mg/dL (ref 0.3–1.2)
Total Protein: 5.2 g/dL — ABNORMAL LOW (ref 6.5–8.1)

## 2019-04-21 LAB — GLUCOSE, CAPILLARY
Glucose-Capillary: 124 mg/dL — ABNORMAL HIGH (ref 70–99)
Glucose-Capillary: 136 mg/dL — ABNORMAL HIGH (ref 70–99)
Glucose-Capillary: 153 mg/dL — ABNORMAL HIGH (ref 70–99)
Glucose-Capillary: 167 mg/dL — ABNORMAL HIGH (ref 70–99)
Glucose-Capillary: 83 mg/dL (ref 70–99)

## 2019-04-21 LAB — IRON AND TIBC
Iron: 69 ug/dL (ref 28–170)
Saturation Ratios: 25 % (ref 10.4–31.8)
TIBC: 274 ug/dL (ref 250–450)
UIBC: 205 ug/dL

## 2019-04-21 LAB — CBC
HCT: 28.9 % — ABNORMAL LOW (ref 36.0–46.0)
Hemoglobin: 9.5 g/dL — ABNORMAL LOW (ref 12.0–15.0)
MCH: 30.3 pg (ref 26.0–34.0)
MCHC: 32.9 g/dL (ref 30.0–36.0)
MCV: 92 fL (ref 80.0–100.0)
Platelets: 179 10*3/uL (ref 150–400)
RBC: 3.14 MIL/uL — ABNORMAL LOW (ref 3.87–5.11)
RDW: 12.7 % (ref 11.5–15.5)
WBC: 5.5 10*3/uL (ref 4.0–10.5)
nRBC: 0 % (ref 0.0–0.2)

## 2019-04-21 LAB — SARS CORONAVIRUS 2 (TAT 6-24 HRS): SARS Coronavirus 2: NEGATIVE

## 2019-04-21 LAB — PHOSPHORUS: Phosphorus: 4.4 mg/dL (ref 2.5–4.6)

## 2019-04-21 LAB — HEPATITIS B CORE ANTIBODY, TOTAL: Hep B Core Total Ab: NONREACTIVE

## 2019-04-21 LAB — FERRITIN: Ferritin: 47 ng/mL (ref 11–307)

## 2019-04-21 LAB — HEPATITIS B SURFACE ANTIBODY,QUALITATIVE: Hep B S Ab: NONREACTIVE

## 2019-04-21 LAB — HEPATITIS B SURFACE ANTIGEN: Hepatitis B Surface Ag: NONREACTIVE

## 2019-04-21 MED ORDER — ASPIRIN EC 81 MG PO TBEC
81.0000 mg | DELAYED_RELEASE_TABLET | Freq: Every day | ORAL | Status: DC
  Administered 2019-04-21 – 2019-04-28 (×7): 81 mg via ORAL
  Filled 2019-04-21 (×7): qty 1

## 2019-04-21 MED ORDER — CAMPHOR-MENTHOL 0.5-0.5 % EX LOTN
1.0000 "application " | TOPICAL_LOTION | Freq: Three times a day (TID) | CUTANEOUS | Status: DC | PRN
  Filled 2019-04-21: qty 222

## 2019-04-21 MED ORDER — INSULIN ASPART 100 UNIT/ML ~~LOC~~ SOLN: 0.0000 [IU] | Freq: Every day | SUBCUTANEOUS | Status: DC

## 2019-04-21 MED ORDER — ISOSORBIDE MONONITRATE ER 60 MG PO TB24
60.0000 mg | ORAL_TABLET | Freq: Every day | ORAL | Status: DC
  Administered 2019-04-21 – 2019-04-29 (×8): 60 mg via ORAL
  Filled 2019-04-21 (×8): qty 1

## 2019-04-21 MED ORDER — INSULIN ASPART 100 UNIT/ML ~~LOC~~ SOLN
0.0000 [IU] | Freq: Three times a day (TID) | SUBCUTANEOUS | Status: DC
  Administered 2019-04-21 – 2019-04-29 (×3): 1 [IU] via SUBCUTANEOUS

## 2019-04-21 MED ORDER — ZOLPIDEM TARTRATE 5 MG PO TABS
5.0000 mg | ORAL_TABLET | Freq: Every evening | ORAL | Status: DC | PRN
  Administered 2019-04-21 – 2019-04-27 (×4): 5 mg via ORAL
  Filled 2019-04-21 (×4): qty 1

## 2019-04-21 MED ORDER — ONDANSETRON HCL 4 MG/2ML IJ SOLN: 4.0000 mg | Freq: Four times a day (QID) | INTRAMUSCULAR | Status: DC | PRN

## 2019-04-21 MED ORDER — HYDROXYZINE HCL 25 MG PO TABS: 25.0000 mg | ORAL_TABLET | Freq: Three times a day (TID) | ORAL | Status: DC | PRN

## 2019-04-21 MED ORDER — FAMOTIDINE 20 MG PO TABS
20.0000 mg | ORAL_TABLET | Freq: Every day | ORAL | Status: DC
  Administered 2019-04-21 – 2019-04-28 (×7): 20 mg via ORAL
  Filled 2019-04-21 (×7): qty 1

## 2019-04-21 MED ORDER — LORATADINE 10 MG PO TABS
10.0000 mg | ORAL_TABLET | Freq: Every day | ORAL | Status: DC
  Administered 2019-04-21 – 2019-04-29 (×8): 10 mg via ORAL
  Filled 2019-04-21 (×8): qty 1

## 2019-04-21 MED ORDER — HEPARIN SODIUM (PORCINE) 5000 UNIT/ML IJ SOLN
5000.0000 [IU] | Freq: Three times a day (TID) | INTRAMUSCULAR | Status: DC
  Administered 2019-04-21 – 2019-04-23 (×6): 5000 [IU] via SUBCUTANEOUS
  Filled 2019-04-21 (×6): qty 1

## 2019-04-21 MED ORDER — NEPRO/CARBSTEADY PO LIQD: 237.0000 mL | Freq: Three times a day (TID) | ORAL | Status: DC | PRN

## 2019-04-21 MED ORDER — ACETAMINOPHEN 650 MG RE SUPP: 650.0000 mg | Freq: Four times a day (QID) | RECTAL | Status: DC | PRN

## 2019-04-21 MED ORDER — GABAPENTIN 100 MG PO CAPS
100.0000 mg | ORAL_CAPSULE | Freq: Three times a day (TID) | ORAL | Status: DC
  Administered 2019-04-21 – 2019-04-29 (×20): 100 mg via ORAL
  Filled 2019-04-21 (×21): qty 1

## 2019-04-21 MED ORDER — SODIUM CHLORIDE 0.9 % IV SOLN
125.0000 mg | INTRAVENOUS | Status: DC
  Administered 2019-04-21: 125 mg via INTRAVENOUS
  Filled 2019-04-21: qty 10

## 2019-04-21 MED ORDER — FUROSEMIDE 20 MG PO TABS
20.0000 mg | ORAL_TABLET | ORAL | Status: DC
  Administered 2019-04-24 – 2019-04-27 (×2): 20 mg via ORAL
  Filled 2019-04-21 (×2): qty 1

## 2019-04-21 MED ORDER — NITROGLYCERIN 0.4 MG SL SUBL: 0.4000 mg | SUBLINGUAL_TABLET | SUBLINGUAL | Status: DC | PRN

## 2019-04-21 MED ORDER — DARBEPOETIN ALFA 60 MCG/0.3ML IJ SOSY
60.0000 ug | PREFILLED_SYRINGE | INTRAMUSCULAR | Status: AC
  Administered 2019-04-21: 60 ug via SUBCUTANEOUS
  Filled 2019-04-21: qty 0.3

## 2019-04-21 MED ORDER — ATORVASTATIN CALCIUM 40 MG PO TABS
40.0000 mg | ORAL_TABLET | Freq: Every day | ORAL | Status: DC
  Administered 2019-04-21 – 2019-04-26 (×5): 40 mg via ORAL
  Filled 2019-04-21 (×5): qty 1

## 2019-04-21 MED ORDER — SORBITOL 70 % SOLN
30.0000 mL | Status: DC | PRN
  Administered 2019-04-29: 16:00:00 30 mL via ORAL
  Filled 2019-04-21: qty 30

## 2019-04-21 MED ORDER — DARBEPOETIN ALFA 60 MCG/0.3ML IJ SOSY
60.0000 ug | PREFILLED_SYRINGE | INTRAMUSCULAR | Status: DC
  Filled 2019-04-21 (×4): qty 0.3

## 2019-04-21 MED ORDER — PANTOPRAZOLE SODIUM 40 MG PO TBEC
40.0000 mg | DELAYED_RELEASE_TABLET | Freq: Every day | ORAL | Status: DC
  Administered 2019-04-21 – 2019-04-29 (×8): 40 mg via ORAL
  Filled 2019-04-21 (×8): qty 1

## 2019-04-21 MED ORDER — CALCIUM CARBONATE ANTACID 1250 MG/5ML PO SUSP
500.0000 mg | Freq: Four times a day (QID) | ORAL | Status: DC | PRN
  Filled 2019-04-21 (×2): qty 5

## 2019-04-21 MED ORDER — RISAQUAD PO CAPS
1.0000 | ORAL_CAPSULE | Freq: Every day | ORAL | Status: DC
  Administered 2019-04-21 – 2019-04-29 (×8): 1 via ORAL
  Filled 2019-04-21 (×8): qty 1

## 2019-04-21 MED ORDER — DOCUSATE SODIUM 283 MG RE ENEM
1.0000 | ENEMA | RECTAL | Status: DC | PRN
  Filled 2019-04-21: qty 1

## 2019-04-21 MED ORDER — MAGNESIUM OXIDE 400 (241.3 MG) MG PO TABS
400.0000 mg | ORAL_TABLET | Freq: Every day | ORAL | Status: DC
  Administered 2019-04-21: 400 mg via ORAL
  Filled 2019-04-21: qty 1

## 2019-04-21 MED ORDER — ACETAMINOPHEN 325 MG PO TABS
650.0000 mg | ORAL_TABLET | Freq: Four times a day (QID) | ORAL | Status: DC | PRN
  Administered 2019-04-24: 650 mg via ORAL
  Filled 2019-04-21: qty 2

## 2019-04-21 MED ORDER — ONDANSETRON HCL 4 MG PO TABS: 4.0000 mg | ORAL_TABLET | Freq: Four times a day (QID) | ORAL | Status: DC | PRN

## 2019-04-21 MED ORDER — METOPROLOL TARTRATE 50 MG PO TABS
50.0000 mg | ORAL_TABLET | Freq: Two times a day (BID) | ORAL | Status: DC
  Administered 2019-04-21 – 2019-04-29 (×12): 50 mg via ORAL
  Filled 2019-04-21 (×12): qty 1

## 2019-04-21 NOTE — Progress Notes (Signed)
Progress Note    Dana Jackson  ZTI:[id number] DOB: 15-Dec-1945  DOA: 04/20/2019 PCP: Heywood Bene, PA-C    Brief Narrative:    Medical records reviewed and are as summarized below:  Dana Jackson is an 74 y.o. 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  Husband is accompanying patient.  She has apparently been progressively getting confused but mainly in the last 3 days.  No obvious source of her confusion.  She was then in the ER with electrolyte abnormalities including hypokalemia.  Patient seems to be having acute metabolic encephalopathy.  At this point she is able to answer questions adequately.  Nephrology has been patient evaluated patient and intends to start hemodialysis as early as tomorrow.  Patient is therefore being admitted to the hospital for treatment..  Assessment/Plan:   Principal Problem:   Delirium Active Problems:   Renal cell carcinoma (HCC)   Essential hypertension   S/P CABG x 3   Anemia   Hyperlipidemia LDL goal <70   Obesity (BMI 30-39.9)   Type 2 diabetes mellitus with renal manifestations, controlled (HCC)   Hyperkalemia   CAD (coronary artery disease) of artery bypass graft   Acute encephalopathy   acute delirium:  -Questionable etiology but most likely multifactorial -Suspect patient has some underlying dementia and she is also possibly uremic from end-stage renal disease   chronic kidney disease stage V:  -Patient is at end-stage -Patient was due to start dialysis next week at home but will initiate dialysis today and again on Monday to see if she has improvement of her symptoms   diabetes:  -SSI  hypertension:  -Continue  home regimen and monitor.  Hyperkalemia: - Patient treated in the ER and will have hemodialysis tomorrow.  Continue monitor  obesity Body mass index is 34.76 kg/m.   Family Communication/Anticipated D/C date and plan/Code Status   DVT prophylaxis: Heparin Code Status: Full Code.  Family Communication: Husband at bedside Disposition Plan: Patient from home with husband but will need to remain inpatient until at least Monday as planned for hemodialysis to begin today and again on Monday   Medical Consultants:    Nephrology     Subjective:   Patient continues to be confused and has some word finding difficulty  Objective:    Vitals:   04/21/19 1205 04/21/19 1310 04/21/19 1318 04/21/19 1330  BP: (!) 190/78 (!) 179/72 (!) 189/91 (!) 176/79  Pulse: 79 76 74 75  Resp: 18 16 19 14   Temp: 98.5 F (36.9 C) 98.2 F (36.8 C)    TempSrc: Oral Oral    SpO2: 96% 96%    Weight:  80.7 kg      Intake/Output Summary (Last 24 hours) at 04/21/2019 1400 Last data filed at 04/21/2019 0910 Gross per 24 hour  Intake 240 ml  Output 500 ml  Net -260 ml   Filed Weights   04/21/19 1310  Weight: 80.7 kg    Exam: Appears well but is still confused Looks to husband to finish her sentences Regular rate and rhythm No increased work of breathing Positive bowel sounds, soft nontender Minimal lower extremity edema  Data Reviewed:   I have personally reviewed following labs and imaging studies:  Labs: Labs show  the following:   Basic Metabolic Panel: Recent Labs  Lab 04/20/19 1331 04/21/19 0525  NA 138 140  K 5.7* 4.4  CL 107 106  CO2 24 25  GLUCOSE 130* 98  BUN 42* 43*  CREATININE 4.43* 4.37*  CALCIUM 9.5 9.4  PHOS  --  4.4   GFR Estimated Creatinine Clearance: 10.6 mL/min (A) (by C-G formula based on SCr of 4.37 mg/dL (H)). Liver Function Tests: Recent Labs  Lab 04/20/19 1331 04/21/19 0525  AST 18 16  ALT 25 22  ALKPHOS 90 82  BILITOT 0.5 0.6  PROT 5.4*  5.2*  ALBUMIN 3.0* 2.8*   No results for input(s): LIPASE, AMYLASE in the last 168 hours. No results for input(s): AMMONIA in the last 168 hours. Coagulation profile No results for input(s): INR, PROTIME in the last 168 hours.  CBC: Recent Labs  Lab 04/20/19 1331 04/21/19 0525  WBC 6.4 5.5  HGB 9.9* 9.5*  HCT 30.7* 28.9*  MCV 94.5 92.0  PLT 202 179   Cardiac Enzymes: No results for input(s): CKTOTAL, CKMB, CKMBINDEX, TROPONINI in the last 168 hours. BNP (last 3 results) No results for input(s): PROBNP in the last 8760 hours. CBG: Recent Labs  Lab 04/20/19 1329 04/21/19 0001 04/21/19 0636 04/21/19 1146  GLUCAP 124* 136* 83 167*   D-Dimer: No results for input(s): DDIMER in the last 72 hours. Hgb A1c: No results for input(s): HGBA1C in the last 72 hours. Lipid Profile: No results for input(s): CHOL, HDL, LDLCALC, TRIG, CHOLHDL, LDLDIRECT in the last 72 hours. Thyroid function studies: No results for input(s): TSH, T4TOTAL, T3FREE, THYROIDAB in the last 72 hours.  Invalid input(s): FREET3 Anemia work up: Recent Labs    04/21/19 0525  FERRITIN 47  TIBC 274  IRON 69   Sepsis Labs: Recent Labs  Lab 04/20/19 1331 04/21/19 0525  WBC 6.4 5.5    Microbiology Recent Results (from the past 240 hour(s))  SARS CORONAVIRUS 2 (TAT 6-24 HRS) Nasopharyngeal Nasopharyngeal Swab     Status: None   Collection Time: 04/20/19 10:11 PM   Specimen: Nasopharyngeal Swab  Result Value Ref Range Status   SARS Coronavirus 2 NEGATIVE NEGATIVE Final    Comment: (NOTE) SARS-CoV-2 target nucleic acids are NOT DETECTED. The SARS-CoV-2 RNA is generally detectable in upper and lower respiratory specimens during the acute phase of infection. Negative results do not preclude SARS-CoV-2 infection, do not rule out co-infections with other pathogens, and should not be used as the sole basis for treatment or other patient management decisions. Negative results must be combined with  clinical observations, patient history, and epidemiological information. The expected result is Negative. Fact Sheet for Patients: SugarRoll.be Fact Sheet for Healthcare Providers: https://www.woods-mathews.com/ This test is not yet approved or cleared by the [place] FDA and  has been authorized for detection and/or diagnosis of SARS-CoV-2 by FDA under an Emergency Use Authorization (EUA). This EUA will remain  in effect (meaning this test can be used) for the duration of the COVID-19 declaration under Section 56 4(b)(1) of the Act, 21 U.S.C. section 360bbb-3(b)(1), unless the authorization is terminated or revoked sooner. Performed at [facility], [place] [address], [place], [place] [address]     Procedures and diagnostic studies:  CT Head Wo Contrast  Result Date: 04/20/2019 CLINICAL DATA:  Altered mental status EXAM: CT HEAD WITHOUT CONTRAST TECHNIQUE: Contiguous axial images were obtained from the base of the skull through the vertex without intravenous contrast. COMPARISON:  03/27/2019 FINDINGS: Brain:  There is atrophy and chronic small vessel disease changes. No acute intracranial abnormality. Specifically, no hemorrhage, hydrocephalus, mass lesion, acute infarction, or significant intracranial injury. Vascular: No hyperdense vessel or unexpected calcification. Skull: No acute calvarial abnormality. Sinuses/Orbits: Visualized paranasal sinuses and mastoids clear. Orbital soft tissues unremarkable. Other: None IMPRESSION: Atrophy, chronic microvascular disease. No acute intracranial abnormality. Electronically Signed   By: Rolm Baptise M.D.   On: 04/20/2019 18:55    Medications:   . acidophilus  1 capsule Oral Daily  . aspirin EC  81 mg Oral QHS  . atorvastatin  40 mg Oral QHS  . calcitRIOL  0.25 mcg Oral Daily  . Chlorhexidine Gluconate Cloth  6 each Topical Q0600  . darbepoetin (ARANESP) injection - DIALYSIS  60 mcg Intravenous  Q Sat-HD  . famotidine  20 mg Oral QHS  . [START ON 04/24/2019] furosemide  20 mg Oral Once per day on Tue Fri  . gabapentin  100 mg Oral TID  . heparin  5,000 Units Subcutaneous Q8H  . insulin aspart  0-5 Units Subcutaneous QHS  . insulin aspart  0-6 Units Subcutaneous TID WC  . isosorbide mononitrate  60 mg Oral Daily  . loratadine  10 mg Oral Daily  . magnesium oxide  400 mg Oral Daily  . metoprolol tartrate  50 mg Oral BID  . pantoprazole  40 mg Oral Daily  . sodium chloride flush  3 mL Intravenous Once   Continuous Infusions: . ferric gluconate (FERRLECIT/NULECIT) IV       LOS: 1 day   [facility] [facility]   How to contact the [facility] Attending or Consulting provider [place] or covering provider during after hours [place], for this patient?  1. Check the care team in [address] and look for a) attending/consulting TRH provider listed and b) the [facility] team listed 2. Log into [email or web address] and use [place]'s universal password to access. If you do not have the password, please contact the hospital operator. 3. Locate the [facility] provider you are looking for under [facility] and page to a number that you can be directly reached. 4. If you still have difficulty reaching the provider, please page the [facility] (Director on Call) for the Hospitalists listed on amion for assistance.  04/21/2019, 2:00 PM

## 2019-04-21 NOTE — Progress Notes (Signed)
New Admission Note:   Arrival Method:  Stretcher from ED Mental Orientation: alert & oriented to self Telemetry: 5M09, CCMD notified Assessment: Completed Skin: Intact IV:  RFA, nsl Pain: 0/10 Tubes: None Safety Measures: Safety Fall Prevention Plan has been discussed  Admission: to be completed 5 [place] Orientation: Patient has been orientated to the room, unit and staff.   Family: none at bedside  Orders to be reviewed and implemented. Will continue to monitor the patient. Call light has been placed within reach and bed alarm has been activated.

## 2019-04-21 NOTE — Progress Notes (Addendum)
[facility] NEPHROLOGY PROGRESS NOTE  Assessment/ Plan: Pt is a 74 y.o. yo female  with HTN, HLD, CAD, DM, CKD 5, sent from [place] kidney office for increasing confusion and to initiate dialysis.  #Confusion/acute encephalopathy: Patient with CKD 5 and concern for uremic encephalopathy however her BUN level is not significantly elevated.  CT head showed brain atrophy, she probably has underlying dementia.  Given her frequent hospitalization with similar presentation, we will initiate hemodialysis to see if this helps her mental status.  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  The original plan was to start home training next week for home hemodialysis.  Now, she is with increasing confusion and concern for uremia therefore starting dialysis today. Renal navigator consulted to arrange OP HD unit.  Subsequently, the plan is to transition to home hemodialysis.  #Hyperkalemia: Improved with a dose of Lokelma.  Now will be managed with dialysis.  #Hypertension: Resume home medication.  Blood pressure expect to improve with dialysis.  #Anemia of CKD: Hemoglobin below goal.  Iron saturation 25%.  I will start IV iron and ESA.  #Secondary hyperparathyroidism: Phosphorus level at goal.  Follow-up  PTH level.  Subjective: Seen and examined at bedside.  Patient was still confused.  Denies nausea vomiting chest pain shortness of breath.  Husband at bedside. Objective Vital signs in last 24 hours: Vitals:   04/20/19 2200 04/20/19 2359 04/21/19 0417 04/21/19 0925  BP: (!) 176/82 (!) 192/71 (!) 159/46 137/68  Pulse: 77 73 66 75  Resp: 19 18 18 18   Temp:  98 F (36.7 C) 98.2 F (36.8 C) 97.7 F (36.5 C)  TempSrc:   Oral Oral  SpO2: 97% 95% 95% 95%   Weight change:   Intake/Output Summary (Last 24 hours) at 04/21/2019 1107 Last data filed at 04/21/2019 0910 Gross per 24 hour  Intake 240 ml  Output 500 ml   Net -260 ml       Labs: Basic Metabolic Panel: Recent Labs  Lab 04/20/19 1331 04/21/19 0525  NA 138 140  K 5.7* 4.4  CL 107 106  CO2 24 25  GLUCOSE 130* 98  BUN 42* 43*  CREATININE 4.43* 4.37*  CALCIUM 9.5 9.4  PHOS  --  4.4   Liver Function Tests: Recent Labs  Lab 04/20/19 1331 04/21/19 0525  AST 18 16  ALT 25 22  ALKPHOS 90 82  BILITOT 0.5 0.6  PROT 5.4* 5.2*  ALBUMIN 3.0* 2.8*   No results for input(s): LIPASE, AMYLASE in the last 168 hours. No results for input(s): AMMONIA in the last 168 hours. CBC: Recent Labs  Lab 04/20/19 1331 04/21/19 0525  WBC 6.4 5.5  HGB 9.9* 9.5*  HCT 30.7* 28.9*  MCV 94.5 92.0  PLT 202 179   Cardiac Enzymes: No results for input(s): CKTOTAL, CKMB, CKMBINDEX, TROPONINI in the last 168 hours. CBG: Recent Labs  Lab 04/20/19 1329 04/21/19 0001 04/21/19 0636  GLUCAP 124* 136* 83    Iron Studies:  Recent Labs    04/21/19 0525  IRON 69  TIBC 274  FERRITIN 47   Studies/Results: CT Head Wo Contrast  Result Date: 04/20/2019 CLINICAL DATA:  Altered mental status EXAM: CT HEAD WITHOUT CONTRAST TECHNIQUE: Contiguous axial images were obtained from the base of the skull through the vertex without intravenous contrast. COMPARISON:  03/27/2019 FINDINGS: Brain: There is atrophy and chronic small vessel disease changes. No acute intracranial abnormality. Specifically, no hemorrhage,  hydrocephalus, mass lesion, acute infarction, or significant intracranial injury. Vascular: No hyperdense vessel or unexpected calcification. Skull: No acute calvarial abnormality. Sinuses/Orbits: Visualized paranasal sinuses and mastoids clear. Orbital soft tissues unremarkable. Other: None IMPRESSION: Atrophy, chronic microvascular disease. No acute intracranial abnormality. Electronically Signed   By: Rolm Baptise M.D.   On: 04/20/2019 18:55    Medications: Infusions:   Scheduled Medications: . acidophilus  1 capsule Oral Daily  . aspirin EC   81 mg Oral QHS  . atorvastatin  40 mg Oral QHS  . calcitRIOL  0.25 mcg Oral Daily  . Chlorhexidine Gluconate Cloth  6 each Topical Q0600  . famotidine  20 mg Oral QHS  . [START ON 04/24/2019] furosemide  20 mg Oral Once per day on Tue Fri  . gabapentin  100 mg Oral TID  . heparin  5,000 Units Subcutaneous Q8H  . insulin aspart  0-5 Units Subcutaneous QHS  . insulin aspart  0-6 Units Subcutaneous TID WC  . isosorbide mononitrate  60 mg Oral Daily  . loratadine  10 mg Oral Daily  . magnesium oxide  400 mg Oral Daily  . metoprolol tartrate  50 mg Oral BID  . pantoprazole  40 mg Oral Daily  . sodium chloride flush  3 mL Intravenous Once    have reviewed scheduled and prn medications.  Physical Exam: General:NAD, comfortable, pleasant confused female. Heart:RRR, s1s2 nl Lungs:clear b/l, no crackle Abdomen:soft, Non-tender, non-distended Extremities: Trace LE edema Dialysis Access: Left upper extremity AV fistula has good thrill and bruit.  Charmian Forbis Tanna Furry 04/21/2019,11:07 AM  LOS: 1 day  Pager: [phone]

## 2019-04-22 ENCOUNTER — Encounter (HOSPITAL_COMMUNITY): Payer: Self-pay | Admitting: Internal Medicine

## 2019-04-22 ENCOUNTER — Inpatient Hospital Stay (HOSPITAL_COMMUNITY): Payer: Medicare PPO

## 2019-04-22 ENCOUNTER — Other Ambulatory Visit (HOSPITAL_COMMUNITY): Payer: Medicare PPO

## 2019-04-22 DIAGNOSIS — R4182 Altered mental status, unspecified: Secondary | ICD-10-CM | POA: Diagnosis not present

## 2019-04-22 LAB — GLUCOSE, CAPILLARY
Glucose-Capillary: 118 mg/dL — ABNORMAL HIGH (ref 70–99)
Glucose-Capillary: 101 mg/dL — ABNORMAL HIGH (ref 70–99)
Glucose-Capillary: 127 mg/dL — ABNORMAL HIGH (ref 70–99)

## 2019-04-22 LAB — AMMONIA: Ammonia: 21 umol/L (ref 9–35)

## 2019-04-22 LAB — PARATHYROID HORMONE, INTACT (NO CA): PTH: 187 pg/mL — ABNORMAL HIGH (ref 15–65)

## 2019-04-22 MED ORDER — SODIUM CHLORIDE 0.9 % IV SOLN
125.0000 mg | INTRAVENOUS | Status: DC
  Administered 2019-04-23 – 2019-04-27 (×2): 125 mg via INTRAVENOUS
  Filled 2019-04-22 (×3): qty 10

## 2019-04-22 MED ORDER — CHLORHEXIDINE GLUCONATE CLOTH 2 % EX PADS
6.0000 | MEDICATED_PAD | Freq: Every day | CUTANEOUS | Status: DC
  Administered 2019-04-22 – 2019-04-29 (×5): 6 via TOPICAL

## 2019-04-22 NOTE — Plan of Care (Signed)
  Problem: Clinical Measurements: Goal: Diagnostic test results will improve Outcome: Progressing   

## 2019-04-22 NOTE — Progress Notes (Signed)
Progress Note    Dana Jackson  KNL:[id number] DOB: 11/02/1945  DOA: 04/20/2019 PCP: Heywood Bene, PA-C    Brief Narrative:    Medical records reviewed and are as summarized below:  Dana Jackson is an 74 y.o. 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  Husband is accompanying patient.  She has apparently been progressively getting confused but mainly in the last 3 days.  No obvious source of her confusion.  She was then in the ER with electrolyte abnormalities including hypokalemia.  Patient seems to be having acute metabolic encephalopathy.  At this point she is able to answer questions adequately.  Nephrology has been patient evaluated patient and intends to start hemodialysis as early as tomorrow.  Patient is therefore being admitted to the hospital for treatment..  Assessment/Plan:   Principal Problem:   Delirium Active Problems:   Renal cell carcinoma (HCC)   Essential hypertension   S/P CABG x 3   Anemia   Hyperlipidemia LDL goal <70   Obesity (BMI 30-39.9)   Type 2 diabetes mellitus with renal manifestations, controlled (HCC)   Hyperkalemia   CAD (coronary artery disease) of artery bypass graft   Acute encephalopathy   acute delirium:  -Questionable etiology but most likely multifactorial -Suspect patient has some underlying dementia and she seems not much better after dialysis -We will get MRI of brain for completeness sake says it appears patient has not had one in over 10 years -b12 pending, ammonia negative   chronic kidney disease stage V:  -Patient is end-stage -Patient was due to start dialysis next week at home but will initiate dialysis today  and again on Monday to see if she has improvement of her symptoms -She had first dialysis done on 4/10 with minimal improvement in mental status   diabetes:  -SSI  hypertension:  -Continue home regimen and monitor.  Hyperkalemia: - Patient treated in the ER  - better with HD  obesity Body mass index is 27.94 kg/m.   Family Communication/Anticipated D/C date and plan/Code Status   DVT prophylaxis: Heparin Code Status: Full Code.  Family Communication: Husband at bedside 4/10 Disposition Plan: Patient from home with husband but will need to remain inpatient until at least Monday as planned for hemodialysis to begin   Medical Consultants:    Nephrology     Subjective:  Patient stated she tolerated dialysis fine  Objective:    Vitals:   04/21/19 2149 04/22/19 0426 04/22/19 0600 04/22/19 0900  BP: (!) 176/67 (!) 164/69  (!) 158/71  Pulse: 91 68  71  Resp: 18 18  18   Temp: 98.3 F (36.8 C) 97.8 F (36.6 C)  98 F (36.7 C)  TempSrc: Oral Oral  Oral  SpO2: 92% 92%  98%  Weight:   64.9 kg     Intake/Output Summary (Last 24 hours) at 04/22/2019 1226 Last data filed at 04/22/2019 0900 Gross per 24 hour  Intake 585.82 ml  Output 1200 ml  Net -614.18 ml   Filed Weights   04/21/19 1310 04/22/19 0600  Weight: 80.7 kg 64.9 kg    Exam: In bed, wig sitting next to her Regular rate and rhythm No increased work of breathing, no wheezing Confused but attempting  to be conversant and appropriate  Data Reviewed:   I have personally reviewed following labs and imaging studies:  Labs: Labs show the following:   Basic Metabolic Panel: Recent Labs  Lab 04/20/19 1331 04/21/19 0525  NA 138 140  K 5.7* 4.4  CL 107 106  CO2 24 25  GLUCOSE 130* 98  BUN 42* 43*  CREATININE 4.43* 4.37*  CALCIUM 9.5 9.4  PHOS  --  4.4   GFR Estimated Creatinine Clearance: 9.5 mL/min (A) (by C-G formula based on SCr of 4.37 mg/dL (H)). Liver Function Tests: Recent Labs  Lab  04/20/19 1331 04/21/19 0525  AST 18 16  ALT 25 22  ALKPHOS 90 82  BILITOT 0.5 0.6  PROT 5.4* 5.2*  ALBUMIN 3.0* 2.8*   No results for input(s): LIPASE, AMYLASE in the last 168 hours. Recent Labs  Lab 04/22/19 0603  AMMONIA 21   Coagulation profile No results for input(s): INR, PROTIME in the last 168 hours.  CBC: Recent Labs  Lab 04/20/19 1331 04/21/19 0525  WBC 6.4 5.5  HGB 9.9* 9.5*  HCT 30.7* 28.9*  MCV 94.5 92.0  PLT 202 179   Cardiac Enzymes: No results for input(s): CKTOTAL, CKMB, CKMBINDEX, TROPONINI in the last 168 hours. BNP (last 3 results) No results for input(s): PROBNP in the last 8760 hours. CBG: Recent Labs  Lab 04/21/19 1146 04/21/19 1641 04/21/19 2218 04/22/19 0612 04/22/19 1151  GLUCAP 167* 124* 153* 118* 101*   D-Dimer: No results for input(s): DDIMER in the last 72 hours. Hgb A1c: No results for input(s): HGBA1C in the last 72 hours. Lipid Profile: No results for input(s): CHOL, HDL, LDLCALC, TRIG, CHOLHDL, LDLDIRECT in the last 72 hours. Thyroid function studies: No results for input(s): TSH, T4TOTAL, T3FREE, THYROIDAB in the last 72 hours.  Invalid input(s): FREET3 Anemia work up: Recent Labs    04/21/19 0525  FERRITIN 47  TIBC 274  IRON 69   Sepsis Labs: Recent Labs  Lab 04/20/19 1331 04/21/19 0525  WBC 6.4 5.5    Microbiology Recent Results (from the past 240 hour(s))  SARS CORONAVIRUS 2 (TAT 6-24 HRS) Nasopharyngeal Nasopharyngeal Swab     Status: None   Collection Time: 04/20/19 10:11 PM   Specimen: Nasopharyngeal Swab  Result Value Ref Range Status   SARS Coronavirus 2 NEGATIVE NEGATIVE Final    Comment: (NOTE) SARS-CoV-2 target nucleic acids are NOT DETECTED. The SARS-CoV-2 RNA is generally detectable in upper and lower respiratory specimens during the acute phase of infection. Negative results do not preclude SARS-CoV-2 infection, do not rule out co-infections with other pathogens, and should not be used as  the sole basis for treatment or other patient management decisions. Negative results must be combined with clinical observations, patient history, and epidemiological information. The expected result is Negative. Fact Sheet for Patients: SugarRoll.be Fact Sheet for Healthcare Providers: https://www.woods-mathews.com/ This test is not yet approved or cleared by the [place] FDA and  has been authorized for detection and/or diagnosis of SARS-CoV-2 by FDA under an Emergency Use Authorization (EUA). This EUA will remain  in effect (meaning this test can be used) for the duration of the COVID-19 declaration under Section 56 4(b)(1) of the Act, 21 U.S.C. section 360bbb-3(b)(1), unless the authorization is terminated or revoked sooner. Performed at [facility], [place] [address] [place], [place] [address]     Procedures and diagnostic studies:  CT Head Wo Contrast  Result Date: 04/20/2019 CLINICAL DATA:  Altered mental status EXAM:  CT HEAD WITHOUT CONTRAST TECHNIQUE: Contiguous axial images were obtained from the base of the skull through the vertex without intravenous contrast. COMPARISON:  03/27/2019 FINDINGS: Brain: There is atrophy and chronic small vessel disease changes. No acute intracranial abnormality. Specifically, no hemorrhage, hydrocephalus, mass lesion, acute infarction, or significant intracranial injury. Vascular: No hyperdense vessel or unexpected calcification. Skull: No acute calvarial abnormality. Sinuses/Orbits: Visualized paranasal sinuses and mastoids clear. Orbital soft tissues unremarkable. Other: None IMPRESSION: Atrophy, chronic microvascular disease. No acute intracranial abnormality. Electronically Signed   By: Rolm Baptise M.D.   On: 04/20/2019 18:55    Medications:   . acidophilus  1 capsule Oral Daily  . aspirin EC  81 mg Oral QHS  . atorvastatin  40 mg Oral QHS  . calcitRIOL  0.25 mcg Oral Daily  .  Chlorhexidine Gluconate Cloth  6 each Topical Q0600  . darbepoetin (ARANESP) injection - DIALYSIS  60 mcg Intravenous Q Sat-HD  . famotidine  20 mg Oral QHS  . [START ON 04/24/2019] furosemide  20 mg Oral Once per day on Tue Fri  . gabapentin  100 mg Oral TID  . heparin  5,000 Units Subcutaneous Q8H  . insulin aspart  0-5 Units Subcutaneous QHS  . insulin aspart  0-6 Units Subcutaneous TID WC  . isosorbide mononitrate  60 mg Oral Daily  . loratadine  10 mg Oral Daily  . metoprolol tartrate  50 mg Oral BID  . pantoprazole  40 mg Oral Daily  . sodium chloride flush  3 mL Intravenous Once   Continuous Infusions: . [START ON 04/23/2019] ferric gluconate (FERRLECIT/NULECIT) IV       LOS: 2 days   Geradine Girt  [facility]   How to contact the [facility] Attending or Consulting provider [place] or covering provider during after hours [place], for this patient?  1. Check the care team in [facility] and look for a) attending/consulting TRH provider listed and b) the [facility] team listed 2. Log into [email or web address] and use [place]'s universal password to access. If you do not have the password, please contact the hospital operator. 3. Locate the [facility] provider you are looking for under [facility] and page to a number that you can be directly reached. 4. If you still have difficulty reaching the provider, please page the [facility] (Director on Call) for the Hospitalists listed on amion for assistance.  04/22/2019, 12:26 PM

## 2019-04-22 NOTE — Progress Notes (Signed)
[facility] NEPHROLOGY PROGRESS NOTE  Assessment/ Plan: Pt is a 74 y.o. yo female  with HTN, HLD, CAD, DM, CKD 5, sent from [place] [facility] office for increasing confusion and to initiate dialysis.  #Confusion/acute encephalopathy: Patient with CKD 5 and concern for uremic encephalopathy however her BUN level is not significantly elevated.  CT head showed brain atrophy, she probably has underlying dementia.  Given her frequent hospitalization with similar presentation, we initiated hemodialysis to see if this helps her mental status.  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  The original plan was to start home training next week for home hemodialysis.  Now, she is with increasing confusion and concern for uremia therefore started dialysis on 4/10.  Tolerated HD well.  Plan for second HD on 4/12.  Renal navigator consulted to arrange OP HD unit.  Subsequently, the plan is to transition to home hemodialysis.  #Hyperkalemia: Improved with a dose of Lokelma.  Now will be managed with dialysis.  #Hypertension: Resume home medication.  Blood pressure expect to improve with dialysis.  #Anemia of CKD: Hemoglobin below goal.  Iron saturation 25%.  I will start IV iron and ESA.  #Secondary hyperparathyroidism: Phosphorus level at goal.  Pending  PTH level. Continue calcitriol.  Subjective: Seen and examined at bedside.  Had dialysis yesterday with around 1 L UF, tolerated well.  Denies nausea vomiting chest pain shortness of breath.  Confusion remains same.  Objective Vital signs in last 24 hours: Vitals:   04/21/19 1641 04/21/19 2149 04/22/19 0426 04/22/19 0600  BP: (!) 175/96 (!) 176/67 (!) 164/69   Pulse: 88 91 68   Resp: 18 18 18    Temp: 97.7 F (36.5 C) 98.3 F (36.8 C) 97.8 F (36.6 C)   TempSrc: Oral Oral Oral   SpO2: 100% 92% 92%   Weight:    64.9 kg   Weight change:   Intake/Output Summary (Last  24 hours) at 04/22/2019 0950 Last data filed at 04/22/2019 0600 Gross per 24 hour  Intake 345.82 ml  Output 1150 ml  Net -804.18 ml       Labs: Basic Metabolic Panel: Recent Labs  Lab 04/20/19 1331 04/21/19 0525  NA 138 140  K 5.7* 4.4  CL 107 106  CO2 24 25  GLUCOSE 130* 98  BUN 42* 43*  CREATININE 4.43* 4.37*  CALCIUM 9.5 9.4  PHOS  --  4.4   Liver Function Tests: Recent Labs  Lab 04/20/19 1331 04/21/19 0525  AST 18 16  ALT 25 22  ALKPHOS 90 82  BILITOT 0.5 0.6  PROT 5.4* 5.2*  ALBUMIN 3.0* 2.8*   No results for input(s): LIPASE, AMYLASE in the last 168 hours. Recent Labs  Lab 04/22/19 0603  AMMONIA 21   CBC: Recent Labs  Lab 04/20/19 1331 04/21/19 0525  WBC 6.4 5.5  HGB 9.9* 9.5*  HCT 30.7* 28.9*  MCV 94.5 92.0  PLT 202 179   Cardiac Enzymes: No results for input(s): CKTOTAL, CKMB, CKMBINDEX, TROPONINI in the last 168 hours. CBG: Recent Labs  Lab 04/21/19 0636 04/21/19 1146 04/21/19 1641 04/21/19 2218 04/22/19 0612  GLUCAP 83 167* 124* 153* 118*    Iron Studies:  Recent Labs    04/21/19 0525  IRON 69  TIBC 274  FERRITIN 47   Studies/Results: CT Head Wo Contrast  Result Date: 04/20/2019 CLINICAL DATA:  Altered mental status EXAM: CT HEAD WITHOUT CONTRAST TECHNIQUE: Contiguous axial images  were obtained from the base of the skull through the vertex without intravenous contrast. COMPARISON:  03/27/2019 FINDINGS: Brain: There is atrophy and chronic small vessel disease changes. No acute intracranial abnormality. Specifically, no hemorrhage, hydrocephalus, mass lesion, acute infarction, or significant intracranial injury. Vascular: No hyperdense vessel or unexpected calcification. Skull: No acute calvarial abnormality. Sinuses/Orbits: Visualized paranasal sinuses and mastoids clear. Orbital soft tissues unremarkable. Other: None IMPRESSION: Atrophy, chronic microvascular disease. No acute intracranial abnormality. Electronically Signed   By:  Rolm Baptise M.D.   On: 04/20/2019 18:55    Medications: Infusions: . ferric gluconate (FERRLECIT/NULECIT) IV 125 mg (04/21/19 1814)    Scheduled Medications: . acidophilus  1 capsule Oral Daily  . aspirin EC  81 mg Oral QHS  . atorvastatin  40 mg Oral QHS  . calcitRIOL  0.25 mcg Oral Daily  . Chlorhexidine Gluconate Cloth  6 each Topical Q0600  . darbepoetin (ARANESP) injection - DIALYSIS  60 mcg Intravenous Q Sat-HD  . famotidine  20 mg Oral QHS  . [START ON 04/24/2019] furosemide  20 mg Oral Once per day on Tue Fri  . gabapentin  100 mg Oral TID  . heparin  5,000 Units Subcutaneous Q8H  . insulin aspart  0-5 Units Subcutaneous QHS  . insulin aspart  0-6 Units Subcutaneous TID WC  . isosorbide mononitrate  60 mg Oral Daily  . loratadine  10 mg Oral Daily  . magnesium oxide  400 mg Oral Daily  . metoprolol tartrate  50 mg Oral BID  . pantoprazole  40 mg Oral Daily  . sodium chloride flush  3 mL Intravenous Once    have reviewed scheduled and prn medications.  Physical Exam: General: Pleasant confused female sitting in bed. Heart:RRR, s1s2 nl Lungs:clear b/l, no crackle Abdomen:soft, Non-tender, non-distended Extremities: Trace LE edema Dialysis Access: Left upper extremity AV fistula has good thrill and bruit.  Reyann Troop Prasad Azra Abrell 04/22/2019,9:50 AM  LOS: 2 days  Pager: [phone]

## 2019-04-23 DIAGNOSIS — R4182 Altered mental status, unspecified: Secondary | ICD-10-CM | POA: Diagnosis not present

## 2019-04-23 LAB — GLUCOSE, CAPILLARY
Glucose-Capillary: 92 mg/dL (ref 70–99)
Glucose-Capillary: 118 mg/dL — ABNORMAL HIGH (ref 70–99)
Glucose-Capillary: 143 mg/dL — ABNORMAL HIGH (ref 70–99)

## 2019-04-23 LAB — VITAMIN B12: Vitamin B-12: 205 pg/mL (ref 180–914)

## 2019-04-23 MED ORDER — HEPARIN SODIUM (PORCINE) 5000 UNIT/ML IJ SOLN
5000.0000 [IU] | Freq: Three times a day (TID) | INTRAMUSCULAR | Status: DC
  Administered 2019-04-24 – 2019-04-25 (×4): 5000 [IU] via SUBCUTANEOUS
  Filled 2019-04-23 (×4): qty 1

## 2019-04-23 MED ORDER — CYANOCOBALAMIN 1000 MCG/ML IJ SOLN
1000.0000 ug | Freq: Once | INTRAMUSCULAR | Status: AC
  Administered 2019-04-23: 1000 ug via INTRAMUSCULAR
  Filled 2019-04-23: qty 1

## 2019-04-23 NOTE — Plan of Care (Signed)
  Problem: Safety: Goal: Ability to remain free from injury will improve Outcome: Progressing   Problem: Activity: Goal: Risk for activity intolerance will decrease Outcome: Progressing   Problem: Clinical Measurements: Goal: Ability to maintain clinical measurements within normal limits will improve Outcome: Progressing

## 2019-04-23 NOTE — Consult Note (Signed)
Chief Complaint: Patient was seen in consultation today for left arm dialysis fistula evaluation with possible intervention Chief Complaint  Patient presents with  . Altered Mental Status   at the request of Dr Graylon Gunning   Supervising Physician: Daryll Brod  Patient Status: [facility] - In-pt  History of Present Illness: Dana Jackson is a 74 y.o. female   HTN; HLD; prev CVA; CAD post CABG Post left nephrectomy 2010- renal cell ca  Known CKD Worsening and now ESRD Left arm dialysis graft was placed 07/2017 in preparation for possible dialysis Was preparing for home dialysis to start soon But became uremic; encephalopathic Admitted 04/20/19 Dialysis was stated in hospital- used L arm AVF 4/9 Completed dialysis  On examination today-- Dr Posey Pronto noted: She has a pulsatile inflow segment with poor quality thrill in the outflow along with a low pitched bruit.  I suspect she has inflow stenosis versus a collateral vein and will request fistulogram.  Request now for left arm fistulogram with possible intervention in IR 04/24/19   Past Medical History:  Diagnosis Date  . Anemia   . Arthritis    hands  . Asthma    childhood  . Bacteremia due to Escherichia coli June 2015    Currently being treated with anti-biotics  . CAD in native artery 12/2010   a) Cath for exertional angina & EKG changes: 40% LM, 80% mid LAD.  95% ost cX, 80-90% PDA --> CABG X3; b) Post CABG CATH for + Myoview with basal anterior ischemia -> Ost LAD 80% (diffuse) then 100% after SP2, RI 70% (too small for PCI), Ost-prox Cx 99% & OM1 90%, OstrPDA 80% (small); Occluded SVG-rPDA.  Patent LIMA-dLAD, SVG-OM: culprit ~ p-m LAD pre-LIMA & RI - not good PCI target --> Med Rx  . CKD (chronic kidney disease) stage 3, GFR 30-59 ml/min   . Degenerative disc disease, cervical    Degenerative disc disease, cervical [722.4]  . Diabetes mellitus without complication ([place])    Type II  . Endometrial cancer [facility]) November 2015     Treated with TAH with pelvic lymphadenectomy followed by radiation and chemotherapy  . GERD (gastroesophageal reflux disease)   . Hearing loss    bilateral - no hearing aids  . Heart murmur   . History of asthma     childhood  . History of blood transfusion   . History of pneumonia    "2-3 times"  . History of unstable angina November/December 2012   T wave inversions in inferolateral leads.  No stress test performed.  . Hyperlipidemia LDL goal <70   . Hypertension, essential, benign   . Neuropathy    hands  . Pneumonia 2018  . Renal cell carcinoma ([place]) 11/18/2008   T2aNx s/p partial left nephrectomy  . 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 Stroke [facility])    TIA- no residual   . TIA (transient ischemic attack) 1992 &  2010    Past Surgical History:  Procedure Laterality Date  . ANTERIOR CERVICAL DECOMP/DISCECTOMY FUSION  10/11/2005   multi-level  . AV FISTULA PLACEMENT Left 08/03/2017   Procedure: ARTERIOVENOUS (AV) FISTULA CREATION LEFT ARM;  Surgeon: Rosetta Posner, MD;  Location: [place];  Service: Vascular;  Laterality: Left;  . CARDIAC CATHETERIZATION  12/24/10   40% left main, 80% mid LAD, 95% ostial circumflex, 80-90% PDA.  [name] CARDIAC CATHETERIZATION N/A 07/01/2014   Procedure: Left Heart Cath and Cors/Grafts Angiography;  Surgeon: Leonie Man, MD;  Location: [place] INVASIVE CV LAB:  For Abn Nuc @ [facility]: Ost LAD 80%, mid LAD 100% after S/P 2, 70% RI (no PCI target), ost-prox Cx 99%, OM1 90%. Ost rPDA 80% (~ pre-CABG), occluded SVG-rPDA, patent LIMA-dLAD, SVG OM; potential culprit for abn Nuc scan = pLAD Dz, small RI 70% or PDA -> med Rx  . CAROTID ENDARTERECTOMY Left   . CHOLECYSTECTOMY  ~ 1971  . COLONOSCOPY    . CORONARY ARTERY BYPASS GRAFT  12/25/2010   Procedure: CORONARY ARTERY BYPASS GRAFTING (CABGX3 - LIMA-LAD, SVG RPL, SVG-Circumflex);  Surgeon: Rexene Alberts, MD;  Location: [place];  Service: Open Heart Surgery;  Laterality: N/A;  .  DILATATION & CURETTAGE/HYSTEROSCOPY WITH MYOSURE N/A 11/02/2013   Procedure: DILATATION & CURETTAGE/HYSTEROSCOPY WITH MYOSURE ABLATION;  Surgeon: Allena Katz, MD;  Location: [place] [facility];  Service: Gynecology;  Laterality: N/A;  . DOPPLER ECHOCARDIOGRAPHY  12/08/2010   EF =>55%,MILD CONCENTRIC LEFT VENTRICULAR HYPERTROPHY  . EYE SURGERY Bilateral    Cataract  . LEFT HEART CATHETERIZATION WITH CORONARY ANGIOGRAM N/A 12/24/2010   Procedure: LEFT HEART CATHETERIZATION WITH CORONARY ANGIOGRAM;  Surgeon: Leonie Man, MD;  Location: [facility] CATH LAB;  Service: Cardiovascular;  Laterality: N/A;  . NM MYOCAR SINGLE W/SPECT  07/26/2007   EF 79%, LEFT VENT.FUNCTION NORMAL  . PARTIAL NEPHRECTOMY Left 11/18/2008   left partial nephrectomy for renal cell CA  . PET Myocardial Perfusion Scan  06/13/2014   At [place]: Moderate size, mild severity completely reversible defect involving the basal anterior, mid anterior and apical anterior segments consistent with ischemia. EF 65% with normal global function.  . TONSILLECTOMY     "in college"  . TOTAL ABDOMINAL HYSTERECTOMY  November 2015    At [facility]: Robotic procedure with pelvic lymphadenectomy  . TRANSTHORACIC ECHOCARDIOGRAM  06/13/2014   At [place]: EF 60-65%. Grade 1 diastolic dysfunction. Mild MR. Aortic sclerosis. Moderate pulmonary hypertension  . TUBAL LIGATION  ~ 1984  . UPPER GI ENDOSCOPY    . WISDOM TOOTH EXTRACTION      Allergies: Sulfa antibiotics, Erythromycin, Pravastatin, Simvastatin, Codeine, and Gemfibrozil  Medications: Prior to Admission medications   Medication Sig Start Date End Date Taking? Authorizing Provider  Alpha-Lipoic Acid 600 MG CAPS Take 600 mg by mouth daily.   Yes [provider]  aspirin 81 MG tablet Take 81 mg by mouth at bedtime.    Yes [provider]  atorvastatin (LIPITOR) 40 MG tablet Take 40 mg by mouth at bedtime.    Yes [provider]  famotidine (PEPCID)  20 MG tablet Take 40 mg by mouth at bedtime.    Yes [provider]  furosemide (LASIX) 20 MG tablet Take 20 mg by mouth See admin instructions. Takes 1 tablet 2 times daily as needed on Tuesday and Friday.   Yes [provider]  gabapentin (NEURONTIN) 100 MG capsule Take 100 mg by mouth 3 (three) times daily.    Yes [provider]  isosorbide mononitrate (IMDUR) 60 MG 24 hr tablet Take 1 tablet (60 mg total) by mouth daily. 01/26/19 04/26/19 Yes Leonie Man, MD  loratadine (CLARITIN) 10 MG tablet Take 10 mg by mouth daily.   Yes [provider]  magnesium oxide (MAG-OX) 400 MG tablet Take 400 mg by mouth daily.   Yes [provider]  metoprolol tartrate (LOPRESSOR) 50 MG tablet Take 1 tablet (50 mg total) by mouth 2 (two) times daily.  02/27/19  Yes Leonie Man, MD  nitroGLYCERIN (NITROSTAT) 0.4 MG SL tablet PLACE 1 TABLET (0.4 MG TOTAL) UNDER THE TONGUE EVERY 5 (FIVE) MINUTES AS NEEDED FOR CHEST PAIN. Patient taking differently: Place 0.4 mg under the tongue every 5 (five) minutes as needed for chest pain.  01/30/19  Yes Leonie Man, MD  Omega-3 Fatty Acids (FISH OIL) 1200 MG CAPS Take 1,200 mg by mouth in the morning and at bedtime.    Yes [provider]  omeprazole (PRILOSEC) 40 MG capsule Take 40 mg by mouth daily before breakfast.    Yes [provider]  Probiotic Product (ALIGN) 4 MG CAPS Take 1 capsule by mouth daily.   Yes [provider]  pyridOXINE (VITAMIN B-6) 100 MG tablet Take 200 mg by mouth daily.    Yes [provider]     Family History  Problem Relation Age of Onset  . AAA (abdominal aortic aneurysm) Mother   . Multiple myeloma Mother   . GER disease Mother   . Coronary artery disease Father   . Heart disease Father   . Coronary artery disease Sister   . Coronary artery disease Brother   . Heart disease Brother   . Stroke Maternal Grandmother   . Heart attack Neg Hx     Social  History   Socioeconomic History  . Marital status: Married    Spouse name: [name]  . Number of children: 2  . Years of education: many  . Highest education level: Not on file  Occupational History  . 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 Smoking status: Never Smoker  . Smokeless tobacco: Never Used  Substance and Sexual Activity  . Alcohol use: Not Currently    Alcohol/week: 0.0 standard drinks  . Drug use: No  . Sexual activity: Yes    Partners: Male    Birth control/protection: Post-menopausal    Comment: husband  Other Topics Concern  . Not on file  Social History Narrative   She is a married Mother of 2.  -- Currently being very busy taking care of 2 foster children that are staying with her daughter.  A 84-year-old and a 44-year-old that they're trying to adopt.  She is very excited about the possibility of becoming a Grandmother.   She does walk and getting exercise, but she is wanting to get back into more activities just because she has really been limited due to her arthritic pains. She used to do things like walking and biking, and she may try to do some biking again, or at least stationary biking.    Does not smoke, does not drink.   Social Determinants of Health   Financial Resource Strain:   . Difficulty of Paying Living Expenses:   Food Insecurity:   . Worried About Charity fundraiser in the Last Year:   . Arboriculturist in the Last Year:   Transportation Needs:   . Film/video editor (Medical):   [name] Lack of Transportation (Non-Medical):   Physical Activity:   . Days of Exercise per Week:   . Minutes of Exercise per Session:   Stress:   . Feeling of Stress :   Social Connections:   . Frequency of Communication with Friends and Family:   . Frequency of Social Gatherings with Friends and Family:   . Attends Religious  Services:   . Active Member of Clubs or  Organizations:   . Attends Archivist Meetings:   [name] Marital Status:     Review of Systems: A 12 point ROS discussed and pertinent positives are indicated in the HPI above.  All other systems are negative.  Review of Systems  Constitutional: Negative for fatigue and fever.  Respiratory: Negative for cough and shortness of breath.   Cardiovascular: Negative for chest pain.  Gastrointestinal: Negative for abdominal pain.  Neurological: Negative for weakness.  Psychiatric/Behavioral: Positive for decreased concentration. Negative for behavioral problems.    Vital Signs: BP (!) 145/66 (BP Location: Right Arm)   Pulse 68   Temp 98.3 F (36.8 C) (Oral)   Resp 16   Ht 5' (1.524 m)   Wt 143 lb 1.3 oz (64.9 kg)   SpO2 96%   BMI 27.94 kg/m   Physical Exam Vitals reviewed.  Cardiovascular:     Rate and Rhythm: Normal rate and regular rhythm.     Heart sounds: Normal heart sounds.  Pulmonary:     Breath sounds: Normal breath sounds.  Abdominal:     Palpations: Abdomen is soft.  Musculoskeletal:        General: Normal range of motion.     Comments: Left arm fistula has +pulse and + thrill -- although minimal at distal end  Skin:    General: Skin is warm and dry.  Neurological:     Mental Status: She is alert.     Comments: Can tell me name Cannot tell me DOB  Psychiatric:     Comments: Consented with husband at bedside     Imaging: DG Abd 1 View  Result Date: 03/29/2019 CLINICAL DATA:  Constipation 4 days. EXAM: ABDOMEN - 1 VIEW COMPARISON:  11/24/2008 FINDINGS: Bowel gas pattern is nonobstructive. Mild to moderate fecal retention over the right colon. There are surgical clips over the right upper quadrant. No free peritoneal air. Moderate curvature of the lumbar spine convex left with significant degenerative changes. Mild degenerate change of the hips. IMPRESSION: Nonobstructive bowel gas pattern with mild to moderate fecal retention over the right colon.  Electronically Signed   By: Marin Olp M.D.   On: 03/29/2019 13:33   CT Head Wo Contrast  Result Date: 04/20/2019 CLINICAL DATA:  Altered mental status EXAM: CT HEAD WITHOUT CONTRAST TECHNIQUE: Contiguous axial images were obtained from the base of the skull through the vertex without intravenous contrast. COMPARISON:  03/27/2019 FINDINGS: Brain: There is atrophy and chronic small vessel disease changes. No acute intracranial abnormality. Specifically, no hemorrhage, hydrocephalus, mass lesion, acute infarction, or significant intracranial injury. Vascular: No hyperdense vessel or unexpected calcification. Skull: No acute calvarial abnormality. Sinuses/Orbits: Visualized paranasal sinuses and mastoids clear. Orbital soft tissues unremarkable. Other: None IMPRESSION: Atrophy, chronic microvascular disease. No acute intracranial abnormality. Electronically Signed   By: Rolm Baptise M.D.   On: 04/20/2019 18:55   CT Head Wo Contrast  Result Date: 03/27/2019 CLINICAL DATA:  Altered mental status. EXAM: CT HEAD WITHOUT CONTRAST TECHNIQUE: Contiguous axial images were obtained from the base of the skull through the vertex without intravenous contrast. COMPARISON:  November 22, 2015 FINDINGS: Brain: No evidence of acute infarction, hemorrhage, hydrocephalus, extra-axial collection or mass lesion/mass effect. Atrophy and chronic microvascular ischemic changes are noted. Vascular: No hyperdense vessel or unexpected calcification. Skull: Normal. Negative for fracture or focal lesion. Sinuses/Orbits: No acute finding. Other: None. IMPRESSION: 1. No acute intracranial abnormality. 2. Atrophy and chronic microvascular  ischemic changes are noted. Electronically Signed   By: Constance Holster M.D.   On: 03/27/2019 23:32   MR BRAIN WO CONTRAST  Result Date: 04/22/2019 CLINICAL DATA:  74 year old female with recent altered mental status common cephalopathy. EXAM: MRI HEAD WITHOUT CONTRAST TECHNIQUE: Multiplanar,  multiecho pulse sequences of the brain and surrounding structures were obtained without intravenous contrast. COMPARISON:  Head CT 04/20/2019. Brain MRI 09/08/2005. FINDINGS: Brain: No restricted diffusion to suggest acute infarction. No midline shift, mass effect, evidence of mass lesion, ventriculomegaly, extra-axial collection or acute intracranial hemorrhage. Cervicomedullary junction and pituitary are within normal limits. Cerebral volume loss since 2007 appears generalized. No cortical encephalomalacia or chronic cerebral blood products identified. Chronic but increased, patchy and confluent bilateral cerebral white matter T2 and FLAIR hyperintensity. Some deep white matter capsule involvement as before. Comparatively mild T2 heterogeneity in the deep gray matter nuclei. Moderate T2 hyperintensity in the pons has also progressed. Vascular: Major intracranial vascular flow voids are stable since 2007 and within normal limits. Skull and upper cervical spine: Partially visible cervical ACDF with no adverse features identified. Visualized bone marrow signal is within normal limits. Sinuses/Orbits: Postoperative changes to both globes, otherwise negative orbits. Mild left sphenoid sinus mucosal thickening is chronic, other paranasal sinuses remain well pneumatized. Other: Mastoids are clear. Visible internal auditory structures appear normal. Scalp and face soft tissues appear negative. IMPRESSION: 1. No acute intracranial abnormality. 2. Chronic but progressed signal changes in the cerebral white matter, deep gray nuclei and pons, most commonly due to chronic small vessel disease. Electronically Signed   By: Genevie Ann M.D.   On: 04/22/2019 16:56   US RENAL  Result Date: 03/28/2019 CLINICAL DATA:  74 year old female with history of renal carcinoma and partial left nephrectomy. Evaluate for hydronephrosis. EXAM: RENAL / URINARY TRACT ULTRASOUND COMPLETE COMPARISON:  Abdominal MRI dated 09/20/2018 and renal  ultrasound dated 10/15/2015. FINDINGS: Right Kidney: Renal measurements: 10.2 x 5.3 x 4.3 cm = volume: 120 mL. There is diffuse increased renal parenchymal echogenicity. No hydronephrosis or shadowing stone. There is a 4 cm inferior pole cyst. Left Kidney: Renal measurements: 10.6 x 4.9 x 5.1 cm = volume: 137 mL. Mild increased echogenicity. No hydronephrosis or shadowing stone. There is a 2.5 cm upper pole cyst. Bladder: Appears normal for degree of bladder distention. Other: None. IMPRESSION: 1. Mild increased echogenicity likely related to underlying medical renal disease. 2. No hydronephrosis or shadowing stone. Electronically Signed   By: Anner Crete M.D.   On: 03/28/2019 03:23   DG Chest Port 1 View  Result Date: 03/27/2019 CLINICAL DATA:  Chest pain and shortness of breath. EXAM: PORTABLE CHEST 1 VIEW COMPARISON:  June 13, 2017 FINDINGS: There is stable right-sided venous Port-A-Cath positioning. Multiple sternal wires and vascular clips are noted. These are seen on the prior study. The heart size and mediastinal contours are within normal limits. There is no evidence of acute infiltrate or pneumothorax. Mild blunting of the left costophrenic angle is seen. A radiopaque fusion plate and screws are seen overlying the cervical spine. The visualized skeletal structures are otherwise unremarkable. Radiopaque surgical clips are again seen overlying the right upper quadrant. IMPRESSION: 1. Small left pleural effusion. 2. Evidence of prior median sternotomy/CABG. Electronically Signed   By: Virgina Norfolk M.D.   On: 03/27/2019 17:33   ECHOCARDIOGRAM COMPLETE  Result Date: 03/28/2019    ECHOCARDIOGRAM REPORT   Patient Name:   Dana ARONOV Date of Exam: 03/28/2019 Medical Rec #:  [id number]  Height:       60.0 in Accession #:    [id number]     Weight:       200.0 lb Date of Birth:  25-Mar-1945     BSA:          1.867 m Patient Age:    33 years       BP:           107/63 mmHg Patient Gender: F               HR:           60 bpm. Exam Location:  Inpatient Procedure: 2D Echo Indications:    murmur 785.2 fever 780.6  History:        Patient has prior history of Echocardiogram examinations, most                 recent 12/08/2010.  Sonographer:    Jannett Celestine RDCS (AE) Referring Phys: [phone] JARED M GARDNER  Sonographer Comments: restricted mobility IMPRESSIONS  1. Left ventricular ejection fraction, by estimation, is 65 to 70%. The left ventricle has normal function. The left ventricle has no regional wall motion abnormalities. There is mild left ventricular hypertrophy. Left ventricular diastolic parameters are indeterminate.  2. Right ventricule is not well visualized but systolic function appears grossly normal. The right ventricular size is normal. Tricuspid regurgitation signal is inadequate for assessing PA pressure.  3. The mitral valve is abnormal. Moderate mitral annular calcification. Trivial mitral valve regurgitation.  4. The aortic valve is tricuspid. Aortic valve regurgitation is trivial. Mild to moderate aortic valve sclerosis/calcification is present, without any evidence of aortic stenosis.  5. Aortic dilatation noted. 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  6. The inferior vena cava is normal in size with greater than 50% respiratory variability, suggesting right atrial pressure of 3 mmHg. FINDINGS  Left Ventricle: Left ventricular ejection fraction, by estimation, is 65 to 70%. The left ventricle has normal function. The left ventricle has no regional wall motion abnormalities. The left ventricular internal cavity size was normal in size. There is  mild left ventricular hypertrophy. Left ventricular diastolic parameters are indeterminate. Right Ventricle: The right ventricular size is normal. No increase in right ventricular wall thickness. Right ventricular systolic function is normal. Tricuspid regurgitation signal is inadequate for assessing PA pressure. Left Atrium:  Left atrial size was normal in size. Right Atrium: Right atrial size was not well visualized. Pericardium: Trivial pericardial effusion is present. Mitral Valve: The mitral valve is abnormal. Moderate mitral annular calcification. Trivial mitral valve regurgitation. Tricuspid Valve: The tricuspid valve is normal in structure. Tricuspid valve regurgitation is mild. Aortic Valve: The aortic valve is tricuspid. Aortic valve regurgitation is trivial. Mild to moderate aortic valve sclerosis/calcification is present, without any evidence of aortic stenosis. Pulmonic Valve: The pulmonic valve was not well visualized. Pulmonic valve regurgitation is trivial. Aorta: The aortic root is normal in size and structure and aortic dilatation noted. 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 Venous: The inferior vena cava is normal in size with greater than 50% respiratory variability, suggesting right atrial pressure of 3 mmHg. IAS/Shunts: No atrial level shunt detected by color flow Doppler.  LEFT VENTRICLE PLAX 2D LVIDd:         3.80 cm  Diastology LVIDs:         1.91 cm  LV e' lateral:   9.03 cm/s LV PW:  1.12 cm  LV E/e' lateral: 11.1 LV IVS:        1.07 cm  LV e' medial:    6.20 cm/s LVOT diam:     1.90 cm  LV E/e' medial:  16.1 LV SV:         64 LV SV Index:   34 LVOT Area:     2.84 cm  LEFT ATRIUM             Index LA diam:        3.30 cm 1.77 cm/m LA Vol (A2C):   38.5 ml 20.63 ml/m LA Vol (A4C):   46.2 ml 24.75 ml/m LA Biplane Vol: 43.9 ml 23.52 ml/m  AORTIC VALVE LVOT Vmax:   88.60 cm/s LVOT Vmean:  60.800 cm/s LVOT VTI:    0.224 m  AORTA Ao Root diam: 2.80 cm MITRAL VALVE MV Area (PHT): 2.66 cm    SHUNTS MV Decel Time: 285 msec    Systemic VTI:  0.22 m MV E velocity: 99.80 cm/s  Systemic Diam: 1.90 cm MV A velocity: 70.40 cm/s MV E/A ratio:  1.42 Oswaldo Milian MD Electronically signed by Oswaldo Milian MD Signature Date/Time: 03/28/2019/4:42:56 PM    Final      Labs:  CBC: Recent Labs    03/30/19 0718 04/10/19 1146 04/20/19 1331 04/21/19 0525  WBC 5.0 6.9 6.4 5.5  HGB 8.8* 9.7* 9.9* 9.5*  HCT 27.5* 31.1* 30.7* 28.9*  PLT 145* 196 202 179    COAGS: Recent Labs    03/27/19 1727  INR 1.0  APTT 37*    BMP: Recent Labs    03/30/19 0718 04/10/19 1146 04/20/19 1331 04/21/19 0525  NA 137 142 138 140  K 4.8 5.1 5.7* 4.4  CL 105 111 107 106  CO2 19* 20* 24 25  GLUCOSE 112* 51* 130* 98  BUN 70* 36* 42* 43*  CALCIUM 9.2 9.2 9.5 9.4  CREATININE 5.17* 3.95* 4.43* 4.37*  GFRNONAA 8* 11* 9* 9*  GFRAA 9* 12* 11* 11*    LIVER FUNCTION TESTS: Recent Labs    03/29/19 1245 03/29/19 1245 03/30/19 0718 04/10/19 1146 04/20/19 1331 04/21/19 0525  BILITOT 0.5  --   --  0.6 0.5 0.6  AST 34  --   --  27 18 16   ALT 39  --   --  41 25 22  ALKPHOS 72  --   --  82 90 82  PROT 5.5*  --   --  5.6* 5.4* 5.2*  ALBUMIN 2.7*   < > 2.6* 3.0* 3.0* 2.8*   < > = values in this interval not displayed.    TUMOR MARKERS: No results for input(s): AFPTM, CEA, CA199, CHROMGRNA in the last 8760 hours.  Assessment and Plan:  ESRD Left arm fistula with infiltration and possible stenosis Scheduled for fistula evalution and possible intervention in IR tomorrow Pt is aware of procedure benefits and risks including but not limited to Infection; bleeding; damage to surrounding structures She and husband are agreeable to proceed 'consent signed and in chart  Thank you for this interesting consult.  I greatly enjoyed meeting Vidalia Serpas and look forward to participating in their care.  A copy of this report was sent to the requesting provider on this date.  Electronically Signed: Lavonia Drafts, PA-C 04/23/2019, 1:31 PM   I spent a total of 20 Minutes    in face to face in clinical consultation, greater than 50% of which was counseling/coordinating care  for left arm fistula evaluation

## 2019-04-23 NOTE — Progress Notes (Signed)
Progress Note    [name]  VPX:[id number] DOB: 1945/10/10  DOA: 04/20/2019 PCP: Heywood Bene, PA-C    Brief Narrative:    Medical records reviewed and are as summarized below:  Dana Jackson is an 74 y.o. 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  Husband is accompanying patient.  She has apparently been progressively getting confused but mainly in the last 3 days.  No obvious source of her confusion.   Nephrology has been patient evaluated patient and intends to start hemodialysis.  Assessment/Plan:   Principal Problem:   Delirium Active Problems:   Renal cell carcinoma (HCC)   Essential hypertension   S/P CABG x 3   Anemia   Hyperlipidemia LDL goal <70   Obesity (BMI 30-39.9)   Type 2 diabetes mellitus with renal manifestations, controlled (HCC)   Hyperkalemia   CAD (coronary artery disease) of artery bypass graft   Acute encephalopathy   acute delirium vs worsening dementia:  -Questionable etiology but most likely multifactorial -Suspect patient has some underlying dementia and she seems not much better after dialysis -MRI with worsening white matter changes -B12 lower end of normal so will replete -long discussion with husband and worsening dementia and prognosis   chronic kidney disease stage V:  -Patient is end-stage -Patient was due to start dialysis next week at home but will initiate dialysis Saturday and again on Monday to see if she has improvement of her symptoms -She had first dialysis done on 4/10 with minimal improvement in mental status   diabetes:  -SSI  hypertension:  -Continue home regimen and monitor.  Hyperkalemia: -  Patient treated in the ER  - better with HD  obesity Body mass index is 27.94 kg/m.   Family Communication/Anticipated D/C date and plan/Code Status   DVT prophylaxis: Heparin Code Status: Full Code.  Family Communication: Husband at bedside 4/12 Disposition Plan: d/c when ok with nephrology -suspect patient will benefit from outpatient palliative care referral  Medical Consultants:    Nephrology     Subjective:  Asking about taking a shower   Objective:    Vitals:   04/22/19 2012 04/23/19 0109 04/23/19 0500 04/23/19 0548  BP: (!) 152/114   (!) 154/74  Pulse: 74   68  Resp: 16   16  Temp: 98.6 F (37 C)   98.3 F (36.8 C)  TempSrc: Oral   Oral  SpO2: 95%   94%  Weight: 64.9 kg  64.9 kg   Height:  5' (1.524 m)      Intake/Output Summary (Last 24 hours) at 04/23/2019 0905 Last data filed at 04/23/2019 0835 Gross per 24 hour  Intake 420 ml  Output 200 ml  Net 220 ml   Filed Weights   04/22/19 0600 04/22/19 2012 04/23/19 0500  Weight: 64.9 kg 64.9 kg 64.9 kg    Exam: In chair, eating breakfast Pleasantly confused and cooperative rrr No wheezing, no increased work of breathing  Data Reviewed:   I have personally reviewed following labs and imaging studies:  Labs: Labs show the following:   Basic Metabolic Panel: Recent Labs  Lab 04/20/19 1331 04/21/19 0525  NA 138 140  K 5.7* 4.4  CL 107 106  CO2 24 25  GLUCOSE 130* 98  BUN 42* 43*  CREATININE 4.43* 4.37*  CALCIUM 9.5 9.4  PHOS  --  4.4   GFR Estimated Creatinine Clearance: 9.5 mL/min (A) (by C-G formula based on SCr of 4.37 mg/dL (H)). Liver Function Tests: Recent Labs  Lab 04/20/19 1331 04/21/19 0525  AST 18 16  ALT 25 22  ALKPHOS 90 82  BILITOT 0.5 0.6  PROT 5.4* 5.2*  ALBUMIN 3.0* 2.8*   No results for input(s): LIPASE, AMYLASE in the last 168 hours. Recent Labs  Lab 04/22/19 0603  AMMONIA 21   Coagulation profile No results for input(s): INR, PROTIME in the last  168 hours.  CBC: Recent Labs  Lab 04/20/19 1331 04/21/19 0525  WBC 6.4 5.5  HGB 9.9* 9.5*  HCT 30.7* 28.9*  MCV 94.5 92.0  PLT 202 179   Cardiac Enzymes: No results for input(s): CKTOTAL, CKMB, CKMBINDEX, TROPONINI in the last 168 hours. BNP (last 3 results) No results for input(s): PROBNP in the last 8760 hours. CBG: Recent Labs  Lab 04/21/19 2218 04/22/19 0612 04/22/19 1151 04/22/19 2014 04/23/19 0635  GLUCAP 153* 118* 101* 127* 118*   D-Dimer: No results for input(s): DDIMER in the last 72 hours. Hgb A1c: No results for input(s): HGBA1C in the last 72 hours. Lipid Profile: No results for input(s): CHOL, HDL, LDLCALC, TRIG, CHOLHDL, LDLDIRECT in the last 72 hours. Thyroid function studies: No results for input(s): TSH, T4TOTAL, T3FREE, THYROIDAB in the last 72 hours.  Invalid input(s): FREET3 Anemia work up: Recent Labs    04/21/19 0525 04/23/19 0531  VITAMINB12  --  205  FERRITIN 47  --   TIBC 274  --   IRON 69  --    Sepsis Labs: Recent Labs  Lab 04/20/19 1331 04/21/19 0525  WBC 6.4 5.5    Microbiology Recent Results (from the past 240 hour(s))  SARS CORONAVIRUS 2 (TAT 6-24 HRS) Nasopharyngeal Nasopharyngeal Swab     Status: None   Collection Time: 04/20/19 10:11 PM   Specimen: Nasopharyngeal Swab  Result Value Ref Range Status   SARS Coronavirus 2 NEGATIVE NEGATIVE Final    Comment: (NOTE) SARS-CoV-2 target nucleic acids are NOT DETECTED. The SARS-CoV-2 RNA is generally detectable in upper and lower respiratory specimens during the acute phase of infection. Negative results do not preclude SARS-CoV-2 infection, do not rule out co-infections with other pathogens, and should not be used as the sole basis for treatment or other patient management decisions. Negative results must be combined with clinical observations, patient history, and epidemiological information. The expected result is Negative. Fact Sheet for  Patients: SugarRoll.be Fact Sheet for Healthcare Providers: https://www.woods-mathews.com/ This test is not yet approved or cleared by the [place] FDA and  has been authorized for detection and/or diagnosis of SARS-CoV-2 by FDA under an Emergency Use Authorization (EUA). This EUA will remain  in effect (meaning this test can be used) for the duration of the COVID-19 declaration under Section 56 4(b)(1) of the Act, 21 U.S.C. section 360bbb-3(b)(1), unless the authorization is terminated or revoked sooner. Performed at [facility], [place] [address] [place], [place] [address]     Procedures and diagnostic studies:  MR BRAIN WO CONTRAST  Result Date: 04/22/2019 CLINICAL DATA:  74 year old female with recent altered mental status common cephalopathy. EXAM: MRI HEAD WITHOUT CONTRAST TECHNIQUE: Multiplanar, multiecho pulse sequences of the brain and surrounding structures were obtained without intravenous contrast. COMPARISON:  Head CT 04/20/2019. Brain MRI 09/08/2005. FINDINGS: Brain: No restricted  diffusion to suggest acute infarction. No midline shift, mass effect, evidence of mass lesion, ventriculomegaly, extra-axial collection or acute intracranial hemorrhage. Cervicomedullary junction and pituitary are within normal limits. Cerebral volume loss since 2007 appears generalized. No cortical encephalomalacia or chronic cerebral blood products identified. Chronic but increased, patchy and confluent bilateral cerebral white matter T2 and FLAIR hyperintensity. Some deep white matter capsule involvement as before. Comparatively mild T2 heterogeneity in the deep gray matter nuclei. Moderate T2 hyperintensity in the pons has also progressed. Vascular: Major intracranial vascular flow voids are stable since 2007 and within normal limits. Skull and upper cervical spine: Partially visible cervical ACDF with no adverse features identified. Visualized bone  marrow signal is within normal limits. Sinuses/Orbits: Postoperative changes to both globes, otherwise negative orbits. Mild left sphenoid sinus mucosal thickening is chronic, other paranasal sinuses remain well pneumatized. Other: Mastoids are clear. Visible internal auditory structures appear normal. Scalp and face soft tissues appear negative. IMPRESSION: 1. No acute intracranial abnormality. 2. Chronic but progressed signal changes in the cerebral white matter, deep gray nuclei and pons, most commonly due to chronic small vessel disease. Electronically Signed   By: Genevie Ann M.D.   On: 04/22/2019 16:56    Medications:   . acidophilus  1 capsule Oral Daily  . aspirin EC  81 mg Oral QHS  . atorvastatin  40 mg Oral QHS  . calcitRIOL  0.25 mcg Oral Daily  . Chlorhexidine Gluconate Cloth  6 each Topical Q0600  . cyanocobalamin  1,000 mcg Intramuscular Once  . darbepoetin (ARANESP) injection - DIALYSIS  60 mcg Intravenous Q Sat-HD  . famotidine  20 mg Oral QHS  . [START ON 04/24/2019] furosemide  20 mg Oral Once per day on Tue Fri  . gabapentin  100 mg Oral TID  . heparin  5,000 Units Subcutaneous Q8H  . insulin aspart  0-5 Units Subcutaneous QHS  . insulin aspart  0-6 Units Subcutaneous TID WC  . isosorbide mononitrate  60 mg Oral Daily  . loratadine  10 mg Oral Daily  . metoprolol tartrate  50 mg Oral BID  . pantoprazole  40 mg Oral Daily  . sodium chloride flush  3 mL Intravenous Once   Continuous Infusions: . ferric gluconate (FERRLECIT/NULECIT) IV       LOS: 3 days   [facility]   How to contact the [facility] Attending or Consulting provider [place] or covering provider during after hours [place], for this patient?  1. Check the care team in [facility] and look for a) attending/consulting [facility] provider listed and b) the [facility] team listed 2. Log into [email or web address] and use [place]'s universal password to access. If you do not have the password, please contact the hospital  operator. 3. Locate the [facility] provider you are looking for under [facility] and page to a number that you can be directly reached. 4. If you still have difficulty reaching the provider, please page the [facility] (Director on Call) for the Hospitalists listed on amion for assistance.  04/23/2019, 9:05 AM

## 2019-04-23 NOTE — Care Management Important Message (Signed)
Important Message  Patient Details  Name: Dana Jackson MRN: [id number] Date of Birth: Oct 25, 1945   Medicare Important Message Given:  Yes     Orbie Pyo 04/23/2019, 3:24 PM

## 2019-04-23 NOTE — Progress Notes (Signed)
Patient ID: Dana Jackson, female   DOB: 04-24-1945, 74 y.o.   MRN: [id number] [facility] Progress Note   Assessment/ Plan:   1.  Acute encephalopathy: Suspected to have underlying dementia with superimposed uremic encephalopathy for which dialysis was started 2. ESRD: With progressive chronic kidney disease stage V and development of uremic symptoms.  She was being teed up for home therapy training however, developed uremic symptoms prior to that and has been started on hemodialysis while here in the hospital on 4/10 with her second treatment due today.  She appears to have had an infiltration of her fistula on dialysis Saturday.  On physical exam, she has a pulsatile inflow segment with poor quality thrill in the outflow along with a low pitched bruit.  I suspect she has inflow stenosis versus a collateral vein and will request fistulogram. 3. Anemia: Hemoglobin and hematocrit are borderline low, on intravenous iron and ESA. 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 5. Nutrition: Continue renal diet with protein supplementation and monitor with hemodialysis. 6. Hypertension: Blood pressures marginally elevated, monitor with hemodialysis/ongoing therapy.  Subjective:   Reports to be feeling better and states "my mind is clearer".   Objective:   BP (!) 145/66 (BP Location: Right Arm)   Pulse 68   Temp 98.3 F (36.8 C) (Oral)   Resp 16   Ht 5' (1.524 m)   Wt 64.9 kg   SpO2 96%   BMI 27.94 kg/m   Physical Exam: Gen: Sitting in recliner, husband at her side CVS: Pulse regular rhythm, normal rate, S1 and S2 normal Resp: Clear to auscultation, no rales/rhonchi Abd: Soft, obese, nontender Ext: Trace lower extremity edema.  Labs: BMET Recent Labs  Lab 04/20/19 1331 04/21/19 0525  NA 138 140  K 5.7* 4.4  CL 107 106  CO2 24 25  GLUCOSE 130* 98  BUN 42* 43*  CREATININE 4.43* 4.37*  CALCIUM 9.5 9.4  PHOS  --  4.4    CBC Recent Labs  Lab 04/20/19 1331 04/21/19 0525  WBC 6.4 5.5  HGB 9.9* 9.5*  HCT 30.7* 28.9*  MCV 94.5 92.0  PLT 202 179      Medications:    . acidophilus  1 capsule Oral Daily  . aspirin EC  81 mg Oral QHS  . atorvastatin  40 mg Oral QHS  . calcitRIOL  0.25 mcg Oral Daily  . Chlorhexidine Gluconate Cloth  6 each Topical Q0600  . cyanocobalamin  1,000 mcg Intramuscular Once  . darbepoetin (ARANESP) injection - DIALYSIS  60 mcg Intravenous Q Sat-HD  . famotidine  20 mg Oral QHS  . [START ON 04/24/2019] furosemide  20 mg Oral Once per day on Tue Fri  . gabapentin  100 mg Oral TID  . heparin  5,000 Units Subcutaneous Q8H  . insulin aspart  0-5 Units Subcutaneous QHS  . insulin aspart  0-6 Units Subcutaneous TID WC  . isosorbide mononitrate  60 mg Oral Daily  . loratadine  10 mg Oral Daily  . metoprolol tartrate  50 mg Oral BID  . pantoprazole  40 mg Oral Daily  . sodium chloride flush  3 mL Intravenous Once   Elmarie Shiley, MD 04/23/2019, 11:24 AM

## 2019-04-24 ENCOUNTER — Inpatient Hospital Stay (HOSPITAL_COMMUNITY): Payer: Medicare PPO

## 2019-04-24 DIAGNOSIS — R4182 Altered mental status, unspecified: Secondary | ICD-10-CM | POA: Diagnosis not present

## 2019-04-24 HISTORY — PX: IR US GUIDE VASC ACCESS LEFT: IMG2389

## 2019-04-24 HISTORY — PX: IR DIALY SHUNT INTRO NEEDLE/INTRACATH INITIAL W/IMG LEFT: IMG6102

## 2019-04-24 LAB — CBC
HCT: 30.9 % — ABNORMAL LOW (ref 36.0–46.0)
Hemoglobin: 10 g/dL — ABNORMAL LOW (ref 12.0–15.0)
MCH: 29.8 pg (ref 26.0–34.0)
MCHC: 32.4 g/dL (ref 30.0–36.0)
MCV: 92 fL (ref 80.0–100.0)
Platelets: 186 10*3/uL (ref 150–400)
RBC: 3.36 MIL/uL — ABNORMAL LOW (ref 3.87–5.11)
RDW: 13 % (ref 11.5–15.5)
WBC: 5.8 10*3/uL (ref 4.0–10.5)
nRBC: 0 % (ref 0.0–0.2)

## 2019-04-24 LAB — BASIC METABOLIC PANEL
Anion gap: 10 (ref 5–15)
BUN: 50 mg/dL — ABNORMAL HIGH (ref 8–23)
CO2: 23 mmol/L (ref 22–32)
Calcium: 9.6 mg/dL (ref 8.9–10.3)
Chloride: 105 mmol/L (ref 98–111)
Creatinine, Ser: 4.76 mg/dL — ABNORMAL HIGH (ref 0.44–1.00)
GFR calc Af Amer: 10 mL/min — ABNORMAL LOW (ref >60)
GFR calc non Af Amer: 8 mL/min — ABNORMAL LOW (ref >60)
Glucose, Bld: 122 mg/dL — ABNORMAL HIGH (ref 70–99)
Potassium: 4.3 mmol/L (ref 3.5–5.1)
Sodium: 138 mmol/L (ref 135–145)

## 2019-04-24 LAB — GLUCOSE, CAPILLARY
Glucose-Capillary: 102 mg/dL — ABNORMAL HIGH (ref 70–99)
Glucose-Capillary: 105 mg/dL — ABNORMAL HIGH (ref 70–99)
Glucose-Capillary: 141 mg/dL — ABNORMAL HIGH (ref 70–99)
Glucose-Capillary: 163 mg/dL — ABNORMAL HIGH (ref 70–99)

## 2019-04-24 MED ORDER — MIDAZOLAM HCL 2 MG/2ML IJ SOLN
INTRAMUSCULAR | Status: AC
  Filled 2019-04-24: qty 2

## 2019-04-24 MED ORDER — LIDOCAINE HCL 1 % IJ SOLN
INTRAMUSCULAR | Status: AC
  Filled 2019-04-24: qty 20

## 2019-04-24 MED ORDER — IOHEXOL 300 MG/ML  SOLN
100.0000 mL | Freq: Once | INTRAMUSCULAR | Status: AC | PRN
  Administered 2019-04-24: 40 mL via INTRAVENOUS

## 2019-04-24 MED ORDER — FENTANYL CITRATE (PF) 100 MCG/2ML IJ SOLN
INTRAMUSCULAR | Status: AC
  Filled 2019-04-24: qty 2

## 2019-04-24 MED ORDER — CYANOCOBALAMIN 1000 MCG/ML IJ SOLN
1000.0000 ug | Freq: Once | INTRAMUSCULAR | Status: AC
  Administered 2019-04-24: 1000 ug via INTRAMUSCULAR
  Filled 2019-04-24: qty 1

## 2019-04-24 NOTE — Progress Notes (Signed)
Patient arterial line infiltrated when trying to assist patient onto bedpan, Ice applied to the infiltration site and re-cannulated above the infiltration site. We continue to monitor.

## 2019-04-24 NOTE — Progress Notes (Signed)
Patient ID: Dana Jackson, female   DOB: 1945-12-21, 74 y.o.   MRN: [id number]  [facility] Progress Note   Assessment/ Plan:   1.  Acute encephalopathy: Suspected to have underlying dementia with superimposed uremic encephalopathy for which she is now on dialysis. 2. ESRD: New start on dialysis with development of a constellation of uremic symptoms; earlier today underwent fistulogram for cannulation difficulties that was determined to be from a tortuous vein rather than focal stenosis requiring angioplasty.  I will attempt to have her most experience cannulator to work with her today; if unsuccessful she will need a tunneled hemodialysis catheter.  The process is underway for outpatient dialysis unit placement. 3. Anemia: Hemoglobin and hematocrit are borderline low, on intravenous iron and ESA. 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 5. Nutrition: Continue renal diet with protein supplementation and monitor with hemodialysis. 6. Hypertension: Blood pressures elevated, monitor with hemodialysis/ongoing therapy.  Subjective:   Reports to be feeling fair, apprehensive about dialysis.   Objective:   BP (!) 206/69   Pulse 67   Temp 98 F (36.7 C) (Oral)   Resp 14   Ht 5' (1.524 m)   Wt 74.3 kg   SpO2 99%   BMI 31.99 kg/m   Physical Exam: Gen: Sitting in recliner, husband at her side CVS: Pulse regular rhythm, normal rate, S1 and S2 normal Resp: Clear to auscultation, no rales/rhonchi Abd: Soft, obese, nontender Ext: Trace lower extremity edema.  Left upper arm AV fistula with pulsatile inflow  Labs: BMET Recent Labs  Lab 04/20/19 1331 04/21/19 0525 04/24/19 0404  NA 138 140 138  K 5.7* 4.4 4.3  CL 107 106 105  CO2 24 25 23   GLUCOSE 130* 98 122*  BUN 42* 43* 50*  CREATININE 4.43* 4.37* 4.76*  CALCIUM 9.5 9.4 9.6  PHOS  --  4.4  --    CBC Recent Labs  Lab 04/20/19 1331 04/21/19 0525  WBC 6.4 5.5  HGB  9.9* 9.5*  HCT 30.7* 28.9*  MCV 94.5 92.0  PLT 202 179      Medications:    . acidophilus  1 capsule Oral Daily  . aspirin EC  81 mg Oral QHS  . atorvastatin  40 mg Oral QHS  . calcitRIOL  0.25 mcg Oral Daily  . Chlorhexidine Gluconate Cloth  6 each Topical Q0600  . cyanocobalamin  1,000 mcg Intramuscular Once  . darbepoetin (ARANESP) injection - DIALYSIS  60 mcg Intravenous Q Sat-HD  . famotidine  20 mg Oral QHS  . furosemide  20 mg Oral Once per day on Tue Fri  . gabapentin  100 mg Oral TID  . heparin  5,000 Units Subcutaneous Q8H  . insulin aspart  0-5 Units Subcutaneous QHS  . insulin aspart  0-6 Units Subcutaneous TID WC  . isosorbide mononitrate  60 mg Oral Daily  . loratadine  10 mg Oral Daily  . metoprolol tartrate  50 mg Oral BID  . pantoprazole  40 mg Oral Daily  . sodium chloride flush  3 mL Intravenous Once   Elmarie Shiley, MD 04/24/2019, 2:00 PM

## 2019-04-24 NOTE — Progress Notes (Signed)
Patient HD access infiltrated for the second time MD notified, HD treatment terminated, Ice applied to the infiltration site. We continue to monitor

## 2019-04-24 NOTE — Progress Notes (Signed)
Progress Note    [name]  AST:[id number] DOB: [birth year]  DOA: 04/20/2019 PCP: Heywood Bene, PA-C    Brief Narrative:    Medical records reviewed and are as summarized below:  Dana Jackson is an 74 y.o. 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  Husband is accompanying patient.  She has apparently been progressively getting confused but mainly in the last 3 days.  No obvious source of her confusion.     HD was initiated in the hospital  Assessment/Plan:   Principal Problem:   Delirium Active Problems:   Renal cell carcinoma (HCC)   Essential hypertension   S/P CABG x 3   Anemia   Hyperlipidemia LDL goal <70   Obesity (BMI 30-39.9)   Type 2 diabetes mellitus with renal manifestations, controlled (HCC)   Hyperkalemia   CAD (coronary artery disease) of artery bypass graft   Acute encephalopathy   acute delirium due to uremia vs worsening dementia:  -Questionable etiology but most likely multifactorial -Suspect patient has some underlying dementia  -MRI with worsening white matter changes -B12 lower end of normal so will replete x2 -long discussion with husband and worsening dementia and prognosis   chronic kidney disease stage V:  -Patient is end-stage -Patient was due to start dialysis next week at home but will initiate dialysis Saturday and again on Monday to see if she has improvement of her symptoms -Patient had HD done on 4/10 and 4/12 -IR has been consulted to evaluate the left arm fistula   diabetes:  -SSI  hypertension:  -Continue home regimen and monitor -May need initiation of new medications pressure continues to run  high  Hyperkalemia: - Patient treated in the ER  - better with HD  obesity Body mass index is 31.99 kg/m.   Family Communication/Anticipated D/C date and plan/Code Status   DVT prophylaxis: Heparin Code Status: Full Code.  Family Communication: Husband at bedside 4/13 Disposition Plan:  Status is: Inpatient  Remains inpatient appropriate because:Inpatient level of care appropriate due to severity of illness   Dispo: The patient is from: Home              Anticipated d/c is to: Home              Anticipated d/c date is: 1 day              Patient currently is not medically stable to d/c.        Medical Consultants:    Nephrology  IR     Subjective:    states she is hungry  Objective:    Vitals:   04/23/19 2115 04/24/19 0549 04/24/19 0922 04/24/19 0955  BP: (!) 155/79 (!) 143/63 (!) 157/79 (!) 197/62  Pulse: 69 65 67 73  Resp: 16 18 18 20   Temp: 97.9 F (36.6 C) 97.8 F (36.6 C) 98 F (36.7 C)   TempSrc: Oral Oral Oral   SpO2: 95% 96% 98% 97%  Weight: 74.3 kg     Height:        Intake/Output Summary (Last 24 hours) at 04/24/2019 1043 Last data filed at 04/24/2019 0600 Gross per 24 hour  Intake 720 ml  Output 0 ml  Net 720  ml   Filed Weights   04/22/19 2012 04/23/19 0500 04/23/19 2115  Weight: 64.9 kg 64.9 kg 74.3 kg    Exam: In chair, husband at bedside Patient's speech much more fluent today but still seems to be confused to situation-she is able to complete her sentences and not have to turn to husband with every sentence to finish for her Regular rate and rhythm No increased work of breathing Minimal lower extremity edema  Data Reviewed:   I have personally reviewed following labs and imaging studies:  Labs: Labs show the following:   Basic Metabolic Panel: Recent Labs  Lab 04/20/19 1331 04/20/19 1331 04/21/19 0525 04/24/19 0404  NA 138  --  140 138  K 5.7*   < > 4.4 4.3  CL 107  --  106 105  CO2 24  --  25 23   GLUCOSE 130*  --  98 122*  BUN 42*  --  43* 50*  CREATININE 4.43*  --  4.37* 4.76*  CALCIUM 9.5  --  9.4 9.6  PHOS  --   --  4.4  --    < > = values in this interval not displayed.   GFR Estimated Creatinine Clearance: 9.3 mL/min (A) (by C-G formula based on SCr of 4.76 mg/dL (H)). Liver Function Tests: Recent Labs  Lab 04/20/19 1331 04/21/19 0525  AST 18 16  ALT 25 22  ALKPHOS 90 82  BILITOT 0.5 0.6  PROT 5.4* 5.2*  ALBUMIN 3.0* 2.8*   No results for input(s): LIPASE, AMYLASE in the last 168 hours. Recent Labs  Lab 04/22/19 0603  AMMONIA 21   Coagulation profile No results for input(s): INR, PROTIME in the last 168 hours.  CBC: Recent Labs  Lab 04/20/19 1331 04/21/19 0525  WBC 6.4 5.5  HGB 9.9* 9.5*  HCT 30.7* 28.9*  MCV 94.5 92.0  PLT 202 179   Cardiac Enzymes: No results for input(s): CKTOTAL, CKMB, CKMBINDEX, TROPONINI in the last 168 hours. BNP (last 3 results) No results for input(s): PROBNP in the last 8760 hours. CBG: Recent Labs  Lab 04/22/19 1659 04/22/19 2014 04/23/19 0635 04/23/19 1113 04/24/19 0642  GLUCAP 92 127* 118* 143* 102*   D-Dimer: No results for input(s): DDIMER in the last 72 hours. Hgb A1c: No results for input(s): HGBA1C in the last 72 hours. Lipid Profile: No results for input(s): CHOL, HDL, LDLCALC, TRIG, CHOLHDL, LDLDIRECT in the last 72 hours. Thyroid function studies: No results for input(s): TSH, T4TOTAL, T3FREE, THYROIDAB in the last 72 hours.  Invalid input(s): FREET3 Anemia work up: Recent Labs    04/23/19 0531  VITAMINB12 205   Sepsis Labs: Recent Labs  Lab 04/20/19 1331 04/21/19 0525  WBC 6.4 5.5    Microbiology Recent Results (from the past 240 hour(s))  SARS CORONAVIRUS 2 (TAT 6-24 HRS) Nasopharyngeal Nasopharyngeal Swab     Status: None   Collection Time: 04/20/19 10:11 PM   Specimen: Nasopharyngeal Swab  Result Value Ref Range Status   SARS Coronavirus 2 NEGATIVE NEGATIVE Final    Comment:  (NOTE) SARS-CoV-2 target nucleic acids are NOT DETECTED. The SARS-CoV-2 RNA is generally detectable in upper and lower respiratory specimens during the acute phase of infection. Negative results do not preclude SARS-CoV-2 infection, do not rule out co-infections with other pathogens, and should not be used as the sole basis for treatment or other patient management decisions. Negative results must be combined with clinical observations, patient history, and epidemiological information. The expected result is  Negative. Fact Sheet for Patients: SugarRoll.be Fact Sheet for Healthcare Providers: https://www.woods-mathews.com/ This test is not yet approved or cleared by the [place] FDA and  has been authorized for detection and/or diagnosis of SARS-CoV-2 by FDA under an Emergency Use Authorization (EUA). This EUA will remain  in effect (meaning this test can be used) for the duration of the COVID-19 declaration under Section 56 4(b)(1) of the Act, 21 U.S.C. section 360bbb-3(b)(1), unless the authorization is terminated or revoked sooner. Performed at [facility], [place] [address] [place], [place] [address]     Procedures and diagnostic studies:  MR BRAIN WO CONTRAST  Result Date: 04/22/2019 CLINICAL DATA:  74 year old female with recent altered mental status common cephalopathy. EXAM: MRI HEAD WITHOUT CONTRAST TECHNIQUE: Multiplanar, multiecho pulse sequences of the brain and surrounding structures were obtained without intravenous contrast. COMPARISON:  Head CT 04/20/2019. Brain MRI 09/08/2005. FINDINGS: Brain: No restricted diffusion to suggest acute infarction. No midline shift, mass effect, evidence of mass lesion, ventriculomegaly, extra-axial collection or acute intracranial hemorrhage. Cervicomedullary junction and pituitary are within normal limits. Cerebral volume loss since 2007 appears generalized. No cortical encephalomalacia or  chronic cerebral blood products identified. Chronic but increased, patchy and confluent bilateral cerebral white matter T2 and FLAIR hyperintensity. Some deep white matter capsule involvement as before. Comparatively mild T2 heterogeneity in the deep gray matter nuclei. Moderate T2 hyperintensity in the pons has also progressed. Vascular: Major intracranial vascular flow voids are stable since 2007 and within normal limits. Skull and upper cervical spine: Partially visible cervical ACDF with no adverse features identified. Visualized bone marrow signal is within normal limits. Sinuses/Orbits: Postoperative changes to both globes, otherwise negative orbits. Mild left sphenoid sinus mucosal thickening is chronic, other paranasal sinuses remain well pneumatized. Other: Mastoids are clear. Visible internal auditory structures appear normal. Scalp and face soft tissues appear negative. IMPRESSION: 1. No acute intracranial abnormality. 2. Chronic but progressed signal changes in the cerebral white matter, deep gray nuclei and pons, most commonly due to chronic small vessel disease. Electronically Signed   By: Genevie Ann M.D.   On: 04/22/2019 16:56    Medications:   . acidophilus  1 capsule Oral Daily  . aspirin EC  81 mg Oral QHS  . atorvastatin  40 mg Oral QHS  . calcitRIOL  0.25 mcg Oral Daily  . Chlorhexidine Gluconate Cloth  6 each Topical Q0600  . cyanocobalamin  1,000 mcg Intramuscular Once  . darbepoetin (ARANESP) injection - DIALYSIS  60 mcg Intravenous Q Sat-HD  . famotidine  20 mg Oral QHS  . furosemide  20 mg Oral Once per day on Tue Fri  . gabapentin  100 mg Oral TID  . heparin  5,000 Units Subcutaneous Q8H  . insulin aspart  0-5 Units Subcutaneous QHS  . insulin aspart  0-6 Units Subcutaneous TID WC  . isosorbide mononitrate  60 mg Oral Daily  . loratadine  10 mg Oral Daily  . metoprolol tartrate  50 mg Oral BID  . pantoprazole  40 mg Oral Daily  . sodium chloride flush  3 mL Intravenous  Once   Continuous Infusions: . ferric gluconate (FERRLECIT/NULECIT) IV 125 mg (04/23/19 1502)     LOS: 4 days   Geradine Girt  [facility]   How to contact the [facility] Attending or Consulting provider [place] or covering provider during after hours [place], for this patient?  1. Check the care team in [facility] and look for a)  attending/consulting [facility] provider listed and b) the [facility] team listed 2. Log into [email or web address] and use [place]'s universal password to access. If you do not have the password, please contact the hospital operator. 3. Locate the [facility] provider you are looking for under [facility] and page to a number that you can be directly reached. 4. If you still have difficulty reaching the provider, please page the [facility] (Director on Call) for the Hospitalists listed on amion for assistance.  04/24/2019, 10:43 AM

## 2019-04-24 NOTE — Sedation Documentation (Signed)
Report called to [facility] RN on the floor on 62M.  No RN intervention given during fistulagram.  Patient is alert and confused per baseline.  Patient put in for teletracking to go back to the floor.

## 2019-04-24 NOTE — Procedures (Signed)
Interventional Radiology Procedure:   Indications: Fistula infiltration.   Procedure: Left arm fistulogram  Findings: Left brachiocephalic fistula is patent.  Focal tortuous and narrowed segment just beyond the artery.  Arterial anastomosis is widely patent.  Small dissection flap (not flow limiting) near puncture site. Central veins patent.   Complications: None     EBL: less than 10 ml  Plan: The area of narrowing is related to tortuousity rather than intimal thickening.  No intervention performed.  If fistula continues to function poorly, consider tunneled catheter placement.     Brentley Landfair R. Anselm Pancoast, MD  Pager: [phone]

## 2019-04-24 NOTE — Plan of Care (Signed)
  Problem: Activity: Goal: Risk for activity intolerance will decrease Outcome: Progressing   Problem: Clinical Measurements: Goal: Ability to maintain clinical measurements within normal limits will improve Outcome: Progressing

## 2019-04-24 NOTE — Sedation Documentation (Signed)
No RN intervention needed during fistulagram.  Patient is alert and confused per baseline.

## 2019-04-25 ENCOUNTER — Inpatient Hospital Stay (HOSPITAL_COMMUNITY): Payer: Medicare PPO

## 2019-04-25 ENCOUNTER — Inpatient Hospital Stay (HOSPITAL_COMMUNITY)
Admit: 2019-04-25 | Discharge: 2019-04-25 | Disposition: A | Discharge status: Home / Self Care | Payer: Medicare PPO | Attending: Neurology | Admitting: Neurology

## 2019-04-25 DIAGNOSIS — R4182 Altered mental status, unspecified: Secondary | ICD-10-CM | POA: Diagnosis not present

## 2019-04-25 DIAGNOSIS — R410 Disorientation, unspecified: Secondary | ICD-10-CM | POA: Diagnosis not present

## 2019-04-25 DIAGNOSIS — G9340 Encephalopathy, unspecified: Secondary | ICD-10-CM | POA: Diagnosis not present

## 2019-04-25 LAB — GLUCOSE, CAPILLARY
Glucose-Capillary: 115 mg/dL — ABNORMAL HIGH (ref 70–99)
Glucose-Capillary: 157 mg/dL — ABNORMAL HIGH (ref 70–99)
Glucose-Capillary: 96 mg/dL (ref 70–99)
Glucose-Capillary: 102 mg/dL — ABNORMAL HIGH (ref 70–99)
Glucose-Capillary: 95 mg/dL (ref 70–99)

## 2019-04-25 LAB — COMPREHENSIVE METABOLIC PANEL
ALT: 41 U/L (ref 0–44)
AST: 40 U/L (ref 15–41)
Albumin: 3 g/dL — ABNORMAL LOW (ref 3.5–5.0)
Alkaline Phosphatase: 86 U/L (ref 38–126)
Anion gap: 11 (ref 5–15)
BUN: 35 mg/dL — ABNORMAL HIGH (ref 8–23)
CO2: 26 mmol/L (ref 22–32)
Calcium: 9.5 mg/dL (ref 8.9–10.3)
Chloride: 100 mmol/L (ref 98–111)
Creatinine, Ser: 4.03 mg/dL — ABNORMAL HIGH (ref 0.44–1.00)
GFR calc Af Amer: 12 mL/min — ABNORMAL LOW (ref >60)
GFR calc non Af Amer: 10 mL/min — ABNORMAL LOW (ref >60)
Glucose, Bld: 188 mg/dL — ABNORMAL HIGH (ref 70–99)
Potassium: 4.1 mmol/L (ref 3.5–5.1)
Sodium: 137 mmol/L (ref 135–145)
Total Bilirubin: 0.9 mg/dL (ref 0.3–1.2)
Total Protein: 5.4 g/dL — ABNORMAL LOW (ref 6.5–8.1)

## 2019-04-25 LAB — CBC
HCT: 30.3 % — ABNORMAL LOW (ref 36.0–46.0)
Hemoglobin: 10 g/dL — ABNORMAL LOW (ref 12.0–15.0)
MCH: 30 pg (ref 26.0–34.0)
MCHC: 33 g/dL (ref 30.0–36.0)
MCV: 91 fL (ref 80.0–100.0)
Platelets: 205 10*3/uL (ref 150–400)
RBC: 3.33 MIL/uL — ABNORMAL LOW (ref 3.87–5.11)
RDW: 13.2 % (ref 11.5–15.5)
WBC: 7.7 10*3/uL (ref 4.0–10.5)
nRBC: 0 % (ref 0.0–0.2)

## 2019-04-25 MED ORDER — LORAZEPAM 2 MG/ML IJ SOLN
1.0000 mg | Freq: Once | INTRAMUSCULAR | Status: AC
  Administered 2019-04-25: 1 mg via INTRAVENOUS

## 2019-04-25 MED ORDER — LORAZEPAM 2 MG/ML IJ SOLN
INTRAMUSCULAR | Status: AC
  Filled 2019-04-25: qty 1

## 2019-04-25 MED ORDER — IOHEXOL 350 MG/ML SOLN
75.0000 mL | Freq: Once | INTRAVENOUS | Status: AC | PRN
  Administered 2019-04-25: 75 mL via INTRAVENOUS

## 2019-04-25 MED ORDER — CYANOCOBALAMIN 1000 MCG/ML IJ SOLN
1000.0000 ug | Freq: Once | INTRAMUSCULAR | Status: AC
  Administered 2019-04-25: 21:00:00 1000 ug via INTRAMUSCULAR
  Filled 2019-04-25: qty 1

## 2019-04-25 MED ORDER — LABETALOL HCL 5 MG/ML IV SOLN: 10.0000 mg | Freq: Four times a day (QID) | INTRAVENOUS | Status: DC | PRN

## 2019-04-25 NOTE — Significant Event (Addendum)
Rapid Response Event Note  Overview: Time Called: 0928 Arrival Time: 0933 Event Type: Neurologic Pt was up in her room, new onset right arm weakness, right sided facial droop, and slurred speech.   Initial Focused Assessment: Pt lying in bed, alert. Pt oriented to name. Pt is able to follow commands and identify objects. PERRLA, 5mm. EOMI. Mild right sided facial droop. Grips are equal, no drift in bilateral upper and low extremities. Equal strength in all extremities. NIH 3 for inability to identify her age, facial palsy, and dysarthria. Pt has neuropathy at baseline, pt intermittently is not able to differentiate which leg I am touching. Per husband, pt has a history of TIA with no deficits. Initially LKW was set for today at 23, primary RN was at bedside for symptom onset.   Pt taken down to CT per Code Stroke protocol. Pt symptoms became worse in CT. Pt noted to be progressively more aphasic, unable to follow commands and increased confusion. Speech is slurred. Pt is moving all extremities. CT, CTA, and MRI completed.   Interventions: -CBG 157 -Code stroke activated: 0946 -CT, CTA, MRI  Plan of Care (if not transferred): Transfer written for Progressive level of care.   Event Summary: Name of Physician Notified: Dr. [place] at [place] Name of Consulting Physician Notified: Dr. Leonel Ramsay at [phone] Outcome: Transferred (Comment)(Transfer order written for Progressive.) Event End Time: [place]

## 2019-04-25 NOTE — Procedures (Signed)
Patient Name: Brynlynn Walko  MRN: [id number]  Epilepsy Attending: Lora Havens  Referring Physician/Provider: Dr. Kathrynn Speed Date: 04/25/2019 Duration: 25.35 minutes  Patient history: 74 year old female who was initially admitted for confusion and today was noted to have an episode of right-sided weakness and speech disturbance.  EEG to evaluate for seizures.  Level of alertness: Lethargic  AEDs during EEG study: None  Technical aspects: This EEG study was done with scalp electrodes positioned according to the 10-20 International system of electrode placement. Electrical activity was acquired at a sampling rate of 500Hz  and reviewed with a high frequency filter of 70Hz  and a low frequency filter of 1Hz . EEG data were recorded continuously and digitally stored.   Description: EEG showed continuous generalized low amplitude 3 to 5 Hz theta-delta slowing.  No clear posterior dominant rhythm was seen.  Hyperventilation and photic stimulation were not performed.  Of note, study was technically difficult due to significant myogenic and electrode artifact.  Abnormality -Continuous slow, generalized  IMPRESSION: This technically difficult study suggestive of moderate diffuse encephalopathy, nonspecific to etiology.  No seizures or epileptiform discharges were seen throughout the recording.  Mariano Doshi Barbra Sarks

## 2019-04-25 NOTE — Progress Notes (Signed)
LTM EEG hooked up and running - no initial skin breakdown - push button tested - neuro notified. **same leads as rEEG

## 2019-04-25 NOTE — Progress Notes (Signed)
LTM EEG discontinued - no skin breakdown at unhook.   

## 2019-04-25 NOTE — Code Documentation (Signed)
Stroke Response Nurse Documentation Code Documentation  Dana Jackson is a 74 y.o. female admitted to [facility] ED 04/20/19 with altered mental status. Patient was diagnosed with acute metabolic encephalopathy. Past medical hx significant of CKD stage V, HT, hyperlipidemia, previous stroke, diabetes and CAD s/p CABG.   Code stroke was activated by rapid response. Patient had been out of bed when the bedside nurse noted she had slurred speech, was unable to hold onto things and complained of numbness in right hand. Patient was taken back to bed. Rapid response team notified and upon assessment noted slight right facial droop and dysarthria. NIH 3. CBG 157.  Stroke team at the patient's bedside noted right facial droop. Patient to CT with team. NIHSS 5, see documentation for details and code stroke times. Patient with disoriented, not following commands, right facial droop, Global aphasia  and dysarthria  on exam. The following imaging was completed: CT, CTA head and neck. Patient became very restless and agitated in CT. Increase in aphasia and left gaze preference. Patient is not a candidate for tPA due to LKW unclear as nurse reported increased confusion earlier this morning. Patient calmer once out of CT. Gaze preference, aphasia, and droop still noted. MRI completed for treatment decision- no stroke identified on scan. 1mg  ativan given for MRI. SBP 187. Care/Plan Q2 mNIH and VS. Bedside handoff with Shawn Route, RN. Dr. Leonel Ramsay updated the patient's husband at the bedside.   Dana Jackson Stroke Response RN

## 2019-04-25 NOTE — Consult Note (Signed)
Neurology Consultation  Reason for Consult: Code stroke Referring Physician: [place] Eric J, DO  CC: Right facial droop, dysarthria with aphasia, right-sided weakness.  History is obtained from: Nurse  HPI: Ticia Virgo is a 74 y.o. female with history of TIA, stroke, CABG x3, hypertension, hyperlipidemia, diabetes, end-stage renal failure who received her first dialysis treatment yesterday.  Patient was initially admitted to hospital on 05/05/2019 for altered mental status.  Patient was found to have acute delirium due to uremia however there was also question of worsening dementia.  Code stroke was called at 920 which would be her last known normal.  Code stroke was called secondary to patient having the right facial droop, right arm weakness and dropping objects, and noted slurred speech.  As patient was being transferred down to CT it was noted that her speech was getting worse as far as understanding and expressing herself.  CT head was obtained.  Due to worsening of patient's ability to understand, follow commands, and express her self there was fear that patient was now developing aphasia.  For that reason CTA of head and neck were obtained to look for a vessel occlusion.  No large vessel occlusion was noted on CTA.  However as patient was still in the window for TPA patient was sent for stat MRI   Chart review (no prior neurological notes seen in epic.)   LKW: unclear, as was confused prior to focal deficits appearing.  tpa given?: no, not a stroke Premorbid modified Rankin scale (mRS): 0 NIH stroke scale : 9   Past Medical History:  Diagnosis Date  . Anemia   . Arthritis    hands  . Asthma    childhood  . Bacteremia due to Escherichia coli June 2015    Currently being treated with anti-biotics  . CAD in native artery 12/2010   a) Cath for exertional angina & EKG changes: 40% LM, 80% mid LAD.  95% ost cX, 80-90% PDA --> CABG X3; b) Post CABG CATH for + Myoview with basal  anterior ischemia -> Ost LAD 80% (diffuse) then 100% after SP2, RI 70% (too small for PCI), Ost-prox Cx 99% & OM1 90%, OstrPDA 80% (small); Occluded SVG-rPDA.  Patent LIMA-dLAD, SVG-OM: culprit ~ p-m LAD pre-LIMA & RI - not good PCI target --> Med Rx  . CKD (chronic kidney disease) stage 3, GFR 30-59 ml/min   . Degenerative disc disease, cervical    Degenerative disc disease, cervical [722.4]  . Diabetes mellitus without complication ([place])    Type II  . Endometrial cancer [facility]) November 2015   Treated with TAH with pelvic lymphadenectomy followed by radiation and chemotherapy  . GERD (gastroesophageal reflux disease)   . Hearing loss    bilateral - no hearing aids  . Heart murmur   . History of asthma     childhood  . History of blood transfusion   . History of pneumonia    "2-3 times"  . History of unstable angina November/December 2012   T wave inversions in inferolateral leads.  No stress test performed.  . Hyperlipidemia LDL goal <70   . Hypertension, essential, benign   . Neuropathy    hands  . Pneumonia 2018  . Renal cell carcinoma ([place]) 11/18/2008   T2aNx s/p partial left nephrectomy  . 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 Stroke [facility])    TIA- no residual   . TIA (transient ischemic attack) 1992 &  2010  Family History  Problem Relation Age of Onset  . AAA (abdominal aortic aneurysm) Mother   . Multiple myeloma Mother   . GER disease Mother   . Coronary artery disease Father   . Heart disease Father   . Coronary artery disease Sister   . Coronary artery disease Brother   . Heart disease Brother   . Stroke Maternal Grandmother   . Heart attack Neg Hx    Social History:   reports that she has never smoked. She has never used smokeless tobacco. She reports previous alcohol use. She reports that she does not use drugs.  Medications  Current Facility-Administered Medications:  .  acetaminophen (TYLENOL) tablet 650 mg, 650 mg, Oral,  Q6H PRN, 650 mg at 04/24/19 2139 **OR** acetaminophen (TYLENOL) suppository 650 mg, 650 mg, Rectal, Q6H PRN, Jonelle Sidle, Mohammad L, MD .  acidophilus (RISAQUAD) capsule 1 capsule, 1 capsule, Oral, Daily, Jonelle Sidle, Mohammad L, MD, 1 capsule at 04/24/19 1725 .  aspirin EC tablet 81 mg, 81 mg, Oral, QHS, Garba, Mohammad L, MD, 81 mg at 04/24/19 2139 .  atorvastatin (LIPITOR) tablet 40 mg, 40 mg, Oral, QHS, Garba, Mohammad L, MD, 40 mg at 04/24/19 2139 .  calcitRIOL (ROCALTROL) capsule 0.25 mcg, 0.25 mcg, Oral, Daily, Rosita Fire, MD, 0.25 mcg at 04/24/19 1723 .  calcium carbonate (dosed in mg elemental calcium) suspension 500 mg of elemental calcium, 500 mg of elemental calcium, Oral, Q6H PRN, Jonelle Sidle, Mohammad L, MD .  camphor-menthol (SARNA) lotion 1 application, 1 application, Topical, I3K PRN **AND** hydrOXYzine (ATARAX/VISTARIL) tablet 25 mg, 25 mg, Oral, Q8H PRN, Jonelle Sidle, Mohammad L, MD .  Chlorhexidine Gluconate Cloth 2 % PADS 6 each, 6 each, Topical, Q0600, Rosita Fire, MD, 6 each at 04/22/19 1128 .  Darbepoetin Alfa (ARANESP) injection 60 mcg, 60 mcg, Intravenous, Q Sat-HD, Vann, Jessica U, DO .  docusate sodium (ENEMEEZ) enema 283 mg, 1 enema, Rectal, PRN, Jonelle Sidle, Mohammad L, MD .  famotidine (PEPCID) tablet 20 mg, 20 mg, Oral, QHS, Garba, Mohammad L, MD, 20 mg at 04/24/19 2139 .  feeding supplement (NEPRO CARB STEADY) liquid 237 mL, 237 mL, Oral, TID PRN, Jonelle Sidle, Mohammad L, MD .  ferric gluconate (NULECIT) 125 mg in sodium chloride 0.9 % 100 mL IVPB, 125 mg, Intravenous, Q M,W,F-HD, Rosita Fire, MD, Last Rate: 110 mL/hr at 04/23/19 1502, 125 mg at 04/23/19 1502 .  furosemide (LASIX) tablet 20 mg, 20 mg, Oral, Once per day on Tue Fri, Garba, Mohammad L, MD, 20 mg at 04/24/19 1725 .  gabapentin (NEURONTIN) capsule 100 mg, 100 mg, Oral, TID, Jonelle Sidle, Mohammad L, MD, 100 mg at 04/24/19 2140 .  heparin injection 5,000 Units, 5,000 Units, Subcutaneous, Q8H, Monia Sabal, PA-C,  5,000 Units at 04/25/19 0510 .  insulin aspart (novoLOG) injection 0-5 Units, 0-5 Units, Subcutaneous, QHS, Garba, Mohammad L, MD .  insulin aspart (novoLOG) injection 0-6 Units, 0-6 Units, Subcutaneous, TID WC, Elwyn Reach, MD, 1 Units at 04/21/19 1804 .  iohexol (OMNIPAQUE) 350 MG/ML injection 75 mL, 75 mL, Intravenous, Once PRN, Greta Doom, MD .  isosorbide mononitrate (IMDUR) 24 hr tablet 60 mg, 60 mg, Oral, Daily, Jonelle Sidle, Mohammad L, MD, 60 mg at 04/24/19 1723 .  loratadine (CLARITIN) tablet 10 mg, 10 mg, Oral, Daily, Jonelle Sidle, Mohammad L, MD, 10 mg at 04/24/19 1724 .  metoprolol tartrate (LOPRESSOR) tablet 50 mg, 50 mg, Oral, BID, Jonelle Sidle, Mohammad L, MD, 50 mg at 04/24/19 2140 .  nitroGLYCERIN (NITROSTAT) SL tablet  0.4 mg, 0.4 mg, Sublingual, Q5 min PRN, Garba, Mohammad L, MD .  ondansetron (ZOFRAN) tablet 4 mg, 4 mg, Oral, Q6H PRN **OR** ondansetron (ZOFRAN) injection 4 mg, 4 mg, Intravenous, Q6H PRN, Garba, Mohammad L, MD .  pantoprazole (PROTONIX) EC tablet 40 mg, 40 mg, Oral, Daily, Jonelle Sidle, Mohammad L, MD, 40 mg at 04/24/19 1724 .  sodium chloride flush (NS) 0.9 % injection 3 mL, 3 mL, Intravenous, Once, Sherwood Gambler, MD .  sorbitol 70 % solution 30 mL, 30 mL, Oral, PRN, Jonelle Sidle, Mohammad L, MD .  zolpidem (AMBIEN) tablet 5 mg, 5 mg, Oral, QHS PRN, Gala Romney L, MD, 5 mg at 04/24/19 2140  ROS:  Unable to obtain due to altered inability to express her self.   Exam: Current vital signs: BP 131/66 (BP Location: Right Arm)   Pulse 72   Temp (!) 97.5 F (36.4 C) (Oral)   Resp 20   Ht 5' (1.524 m)   Wt 77.2 kg   SpO2 95%   BMI 33.24 kg/m  Vital signs in last 24 hours: Temp:  [97.5 F (36.4 C)-98.5 F (36.9 C)] 97.5 F (36.4 C) (04/14 1224) Pulse Rate:  [64-87] 72 (04/14 0931) Resp:  [14-20] 20 (04/14 0833) BP: (121-206)/(57-108) 131/66 (04/14 0931) SpO2:  [95 %-100 %] 95 % (04/14 0833) Weight:  [77.2 kg-77.6 kg] 77.2 kg (04/14 0500)   Constitutional:  Appears well-developed and well-nourished.  Eyes: No scleral injection HENT: No OP obstrucion Head: Normocephalic.  Cardiovascular: Normal rate and regular rhythm.  Respiratory: Effort normal, non-labored breathing GI: Soft.  No distension. There is no tenderness.  Skin: WDI  Neuro: Mental Status: Patient is awake, unable to follow commands and or express her self.  Significant difficulty with naming, repeating.  Dysarthria Cranial Nerves: II: Visual Fields are full.  III,IV, VI: EOMI without ptosis or diploplia. Pupils equal, round and reactive to light V: Facial sensation is symmetric to temperature VII: Right facial droop VIII: hearing is intact to voice X: Palat elevates symmetrically XI: Shoulder shrug is symmetric. XII: tongue is midline without atrophy or fasciculations.  Motor: Tone is normal. Bulk is normal. 5/5 strength was present in all four extremities.  Drift aterixis Sensory: Withdraws from pain in all extremities No DSS Deep Tendon Reflexes: 2+ and symmetric in the biceps and patellae.  Plantars: Toes are downgoing bilaterally.  Cerebellar: FNF intact unable to do heel-to-shin secondary to confusion and agitation  Labs I have reviewed labs in epic and the results pertinent to this consultation are:  CBC    Component Value Date/Time   WBC 7.7 04/25/2019 0822   RBC 3.33 (L) 04/25/2019 0822   HGB 10.0 (L) 04/25/2019 0822   HCT 30.3 (L) 04/25/2019 0822   PLT 205 04/25/2019 0822   MCV 91.0 04/25/2019 0822   MCH 30.0 04/25/2019 0822   MCHC 33.0 04/25/2019 0822   RDW 13.2 04/25/2019 0822   LYMPHSABS 1.4 03/30/2019 0718   MONOABS 0.6 03/30/2019 0718   EOSABS 0.3 03/30/2019 0718   BASOSABS 0.0 03/30/2019 0718    CMP     Component Value Date/Time   NA 137 04/25/2019 0822   NA 143 09/30/2015 0000   K 4.1 04/25/2019 0822   CL 100 04/25/2019 0822   CO2 26 04/25/2019 0822   GLUCOSE 188 (H) 04/25/2019 0822   BUN 35 (H) 04/25/2019 0822   BUN 49 (H)  09/30/2015 0000   CREATININE 4.03 (H) 04/25/2019 0822   CALCIUM 9.5 04/25/2019 8250  PROT 5.4 (L) 04/25/2019 0822   PROT 6.0 09/30/2015 0000   ALBUMIN 3.0 (L) 04/25/2019 0822   ALBUMIN 3.8 09/30/2015 0000   AST 40 04/25/2019 0822   ALT 41 04/25/2019 0822   ALKPHOS 86 04/25/2019 0822   BILITOT 0.9 04/25/2019 0822   BILITOT 0.2 09/30/2015 0000   GFRNONAA 10 (L) 04/25/2019 0822   GFRAA 12 (L) 04/25/2019 0822    Lipid Panel     Component Value Date/Time   CHOL 197 09/30/2015 0000   TRIG 331 (H) 09/30/2015 0000   HDL 29 (L) 09/30/2015 0000   CHOLHDL 2.5 12/24/2010 0645   VLDL 12 12/24/2010 0645   LDLCALC 102 (H) 09/30/2015 0000     Imaging I have reviewed the images obtained:  CT-scan of the brain-no CT evidence of acute intracranial abnormality  CTA of head and neck-extensive motion artifact with no evidence of emergent large vessel occlusion.  Carotid and vertebral atherosclerosis.  Cannot quantify any stenosis due to the degree of artifact  MRI examination of the brain-negative  Etta Quill PA-C [facility] [phone]  M-F  (9:00 am- 5:00 PM)  04/25/2019, 10:12 AM   I have seen the patient and reviewed the above note.  Assessment:  74 year old female originally brought to the hospital secondary to confusion.  Given the presence of confusion even prior to her acute change, I am not sure if this was mild symptoms which progressed versus actual new event.  Therefore, I am not certain she is within the IV TPA window.  She was taken for a CT/CTA which did not reveal any intervenable lesion.  In an effort to confirm diagnosis, she was taken for a stat MRI which was negative for acute ischemic stroke.  Given that she did have a relatively abrupt decline, I do think that looking for other causes such as seizure would be prudent.   Impression: Aphasia  Recommendations: 1) EEG 2) if negative, consider continuous monitoring.  3) If no result, consider MRI  tomorrow.  4) borderline B12, recommend repletion IM.  5) TSH  Roland Rack, MD [facility] [phone]  If 7pm- 7am, please page neurology on call as listed in [place].

## 2019-04-25 NOTE — Progress Notes (Signed)
PROGRESS NOTE    Dana Jackson  LZJ:[id number] DOB: 1945-04-19 DOA: 04/20/2019 PCP: Heywood Bene, PA-C    Brief Narrative:   Dana Jackson is a 74 y.o. female  with medical history significant ofchronic kidney disease stage V not yet on hemodialysis but dialysis catheter in place, hypertension, hyperlipidemia, previous CVA, renal cell carcinoma, diabetes, coronary artery disease status post coronary artery bypass graft, status post left nephrectomy who presented to the ER with altered mental status. Husband is accompanying patient. She has apparently been progressively getting confused but mainly in the last 3 days. No obvious source of her confusion. HD was initiated in the hospital   Assessment & Plan:   Principal Problem:   Delirium Active Problems:   Renal cell carcinoma (HCC)   Essential hypertension   S/P CABG x 3   Anemia   Hyperlipidemia LDL goal <70   Obesity (BMI 30-39.9)   Type 2 diabetes mellitus with renal manifestations, controlled (HCC)   Hyperkalemia   CAD (coronary artery disease) of artery bypass graft   Acute encephalopathy   Acute CVA vs TIA Notified by nursing this morning regarding each, right facial droop and numbness to right arm with associated drift noted to right arm/leg.  Rapid response/code stroke was initiated.  CT head 04/25/2019 with no evidence of acute intracranial normality, stable appearance of generalized parenchymal atrophy and chronic small vessel ischemic disease.  CT angio head/neck artifact, no evidence of emergence large vessel occlusion, carotid/vertebral atherosclerosis but cannot quantify stenosis due to degree of artifact.  MR brain negative for acute infarct, no brain edema; but notable for chronic small vessel ischemia --Transfer to progressive unit --Neurology following, appreciate assistance --EEG pending --Continue neurochecks q2hrs  Acute delirium 2/2 uremia vs worsening dementia: Questionable etiology but most  likely multifactorial; patient with likely some underlying dementia.  MR brain with worsening white matter changes, repeat MR brain this morning for concerns of acute CVA versus TIA negative for acute infarct or brain edema. -B12 lower end of normal so will replete x2  Chronic kidney disease stage V now progressed to end-stage: Patient was evaluated by IR regarding a left arm fistula given difficulty accessing with HD during this hospitalization.  Fistulogram noted that fistula widely patent with  Focal tortuous and narrowed segment just beyond the artery.  --Patient had HD done on 4/10 and 4/12 --Nephrology plans to place tunneled dialysis catheter due to difficulties with cannulation of fistula --Still awaiting outpatient HD bed assignment  Type 2 diabetes mellitus: Hemoglobin A1c 5.7 on 03/27/2019, well controlled.  Controlled at home. --Continue insulin sliding scale while inpatient  Essential hypertension:  BP 121/57 this morning, well controlled. --Follow-up tartrate 50 mg p.o. twice daily --Continue aspirin and statin --Continue to monitor blood pressure closely  Hyperkalemia: Patient treated in the ER and improved with HD.  Potassium 4.1 today. --New HD per nephrology  Obesity Body mass index is 31.99 kg/m.  HLD: Continue atorvastatin 40 mg p.o. daily   DVT prophylaxis: Heparin Code Status: Full code Family Communication: Discussed with patient's husband who is present at bedside  Status is: Inpatient  Remains inpatient appropriate because:Persistent severe electrolyte disturbances, Altered mental status, Ongoing diagnostic testing needed not appropriate for outpatient work up, Unsafe d/c plan and Inpatient level of care appropriate due to severity of illness   Dispo: The patient is from: Home              Anticipated d/c is to: Home  Anticipated d/c date is: 3 days              Patient currently is not medically stable to d/c.   Consultants:    Nephrology  Neurology  Procedures:  Left arm fistulogram 04/24/2019 Left brachiocephalic fistula is patent.  Focal tortuous and narrowed segment just beyond the artery.  Arterial anastomosis is widely patent.  Small dissection flap (not flow limiting) near puncture site. Central veins patent.   Antimicrobials:   None   Subjective: Patient seen and examined bedside, somewhat confused this morning.  Husband present.  Pulled out IV last night, without allowing nursing staff to access port to reinsert IV catheter.  Seems to be more willing this morning.  Nursing staff concerned about her worsening confusion.  Patient with no specific complaints this morning, although oriented to person/time, not place.  Patient denies headache, no visual changes, no chest pain, no palpitations, no abdominal pain.  No other acute events overnight per nursing staff.  Received page from primary nurse regarding concern for possible TIA with slurred speech, right arm numbness and worsening confusion.  Code stroke initiated, neurology following.  No significant findings on CT head, CT angiogram neck/head, MR brain.  Transferring to progressive unit for closer monitoring.  Objective: Vitals:   04/25/19 0412 04/25/19 0500 04/25/19 0833 04/25/19 0931  BP: (!) 121/57  (!) 127/59 131/66  Pulse: 78  74 72  Resp: 18  20   Temp: 98.1 F (36.7 C)  (!) 97.5 F (36.4 C)   TempSrc: Oral  Oral   SpO2: 98%  95%   Weight:  77.2 kg    Height:        Intake/Output Summary (Last 24 hours) at 04/25/2019 1104 Last data filed at 04/25/2019 0833 Gross per 24 hour  Intake 460 ml  Output 504 ml  Net -44 ml   Filed Weights   04/24/19 1355 04/24/19 1608 04/25/19 0500  Weight: 77.6 kg 77.6 kg 77.2 kg    Examination:  General exam: Appears calm and comfortable  Respiratory system: Clear to auscultation. Respiratory effort normal.  Oxygenating well on room air Cardiovascular system: S1 & S2 heard, RRR. No JVD, murmurs,  rubs, gallops or clicks. No pedal edema. Gastrointestinal system: Abdomen is nondistended, soft and nontender. No organomegaly or masses felt. Normal bowel sounds heard. Central nervous system: Alert, oriented to person/time but not place [facility] [place]) and situation Extremities: Symmetric 5 x 5 power. Skin: No rashes, lesions or ulcers Psychiatry: Judgement and insight appear poor. Mood & affect appropriate.     Data Reviewed: I have personally reviewed following labs and imaging studies  CBC: Recent Labs  Lab 04/20/19 1331 04/21/19 0525 04/24/19 1420 04/25/19 0822  WBC 6.4 5.5 5.8 7.7  HGB 9.9* 9.5* 10.0* 10.0*  HCT 30.7* 28.9* 30.9* 30.3*  MCV 94.5 92.0 92.0 91.0  PLT 202 179 186 188   Basic Metabolic Panel: Recent Labs  Lab 04/20/19 1331 04/21/19 0525 04/24/19 0404 04/25/19 0822  NA 138 140 138 137  K 5.7* 4.4 4.3 4.1  CL 107 106 105 100  CO2 24 25 23 26   GLUCOSE 130* 98 122* 188*  BUN 42* 43* 50* 35*  CREATININE 4.43* 4.37* 4.76* 4.03*  CALCIUM 9.5 9.4 9.6 9.5  PHOS  --  4.4  --   --    GFR: Estimated Creatinine Clearance: 11.3 mL/min (A) (by C-G formula based on SCr of 4.03 mg/dL (H)). Liver Function Tests: Recent Labs  Lab 04/20/19  1331 04/21/19 0525 04/25/19 0822  AST 18 16 40  ALT 25 22 41  ALKPHOS 90 82 86  BILITOT 0.5 0.6 0.9  PROT 5.4* 5.2* 5.4*  ALBUMIN 3.0* 2.8* 3.0*   No results for input(s): LIPASE, AMYLASE in the last 168 hours. Recent Labs  Lab 04/22/19 0603  AMMONIA 21   Coagulation Profile: No results for input(s): INR, PROTIME in the last 168 hours. Cardiac Enzymes: No results for input(s): CKTOTAL, CKMB, CKMBINDEX, TROPONINI in the last 168 hours. BNP (last 3 results) No results for input(s): PROBNP in the last 8760 hours. HbA1C: No results for input(s): HGBA1C in the last 72 hours. CBG: Recent Labs  Lab 04/24/19 1234 04/24/19 1702 04/24/19 2145 04/25/19 0712 04/25/19 0933  GLUCAP 105* 141* 163* 115* 157*    Lipid Profile: No results for input(s): CHOL, HDL, LDLCALC, TRIG, CHOLHDL, LDLDIRECT in the last 72 hours. Thyroid Function Tests: No results for input(s): TSH, T4TOTAL, FREET4, T3FREE, THYROIDAB in the last 72 hours. Anemia Panel: Recent Labs    04/23/19 0531  VITAMINB12 205   Sepsis Labs: No results for input(s): PROCALCITON, LATICACIDVEN in the last 168 hours.  Recent Results (from the past 240 hour(s))  SARS CORONAVIRUS 2 (TAT 6-24 HRS) Nasopharyngeal Nasopharyngeal Swab     Status: None   Collection Time: 04/20/19 10:11 PM   Specimen: Nasopharyngeal Swab  Result Value Ref Range Status   SARS Coronavirus 2 NEGATIVE NEGATIVE Final    Comment: (NOTE) SARS-CoV-2 target nucleic acids are NOT DETECTED. The SARS-CoV-2 RNA is generally detectable in upper and lower respiratory specimens during the acute phase of infection. Negative results do not preclude SARS-CoV-2 infection, do not rule out co-infections with other pathogens, and should not be used as the sole basis for treatment or other patient management decisions. Negative results must be combined with clinical observations, patient history, and epidemiological information. The expected result is Negative. Fact Sheet for Patients: SugarRoll.be Fact Sheet for Healthcare Providers: https://www.woods-mathews.com/ This test is not yet approved or cleared by the [place] FDA and  has been authorized for detection and/or diagnosis of SARS-CoV-2 by FDA under an Emergency Use Authorization (EUA). This EUA will remain  in effect (meaning this test can be used) for the duration of the COVID-19 declaration under Section 56 4(b)(1) of the Act, 21 U.S.C. section 360bbb-3(b)(1), unless the authorization is terminated or revoked sooner. Performed at [facility], [place] [address] [place], [place] [address]          Radiology Studies: CT ANGIO HEAD W OR WO CONTRAST  Result  Date: 04/25/2019 CLINICAL DATA:  Aphasia. EXAM: CT ANGIOGRAPHY HEAD AND NECK TECHNIQUE: Multidetector CT imaging of the head and neck was performed using the standard protocol during bolus administration of intravenous contrast. Multiplanar CT image reconstructions and MIPs were obtained to evaluate the vascular anatomy. Carotid stenosis measurements (when applicable) are obtained utilizing NASCET criteria, using the distal internal carotid diameter as the denominator. CONTRAST:  Dose is currently not available COMPARISON:  04/20/2019 head CT and 04/22/2019 brain MRI FINDINGS: CTA NECK FINDINGS Aortic arch: Status post CABG. Atherosclerosis without acute finding such as dissection. Right carotid system: Significant motion degradation throughout the neck. There is calcified plaque at the bifurcation with no possible estimate of narrowing. Left carotid system: Extensive motion artifact throughout the neck. There is calcified plaque at the bifurcation with some degree of stenosis that is not quantifiable. Vertebral arteries: No proximal subclavian flow limiting stenosis. There is calcified plaque at  both subclavian arteries. Diffuse motion artifact affecting the vertebral arteries, which are patent to the dura. Skeleton: Cervical spine degeneration with multilevel ACDF. Other neck: No acute finding. Upper chest: Nodular density in the lingula the level of the hilum, measuring 8 mm-stable from 2011. Review of the MIP images confirms the above findings CTA HEAD FINDINGS Anterior circulation: Significant motion artifact. No evidence of emergent large vessel occlusion. There is atherosclerotic plaque of the carotid siphons with luminal stenosis that is not well assessed. Posterior circulation: Extensive atherosclerotic plaque. Symmetric patent vertebral arteries and diffusely patent basilar. Symmetric flow in the posterior cerebral arteries which are significantly degraded by motion. Venous sinuses: Unremarkable in the  arterial phase Anatomic variants: None significant Review of the MIP images confirms the above findings IMPRESSION: 1. Extensive motion artifact with no evidence of emergent large vessel occlusion. 2. Carotid and vertebral atherosclerosis. Cannot quantify any stenosis due to the degree of artifact. Electronically Signed   By: Monte Fantasia M.D.   On: 04/25/2019 10:34   CT ANGIO NECK W OR WO CONTRAST  Result Date: 04/25/2019 CLINICAL DATA:  Aphasia. EXAM: CT ANGIOGRAPHY HEAD AND NECK TECHNIQUE: Multidetector CT imaging of the head and neck was performed using the standard protocol during bolus administration of intravenous contrast. Multiplanar CT image reconstructions and MIPs were obtained to evaluate the vascular anatomy. Carotid stenosis measurements (when applicable) are obtained utilizing NASCET criteria, using the distal internal carotid diameter as the denominator. CONTRAST:  Dose is currently not available COMPARISON:  04/20/2019 head CT and 04/22/2019 brain MRI FINDINGS: CTA NECK FINDINGS Aortic arch: Status post CABG. Atherosclerosis without acute finding such as dissection. Right carotid system: Significant motion degradation throughout the neck. There is calcified plaque at the bifurcation with no possible estimate of narrowing. Left carotid system: Extensive motion artifact throughout the neck. There is calcified plaque at the bifurcation with some degree of stenosis that is not quantifiable. Vertebral arteries: No proximal subclavian flow limiting stenosis. There is calcified plaque at both subclavian arteries. Diffuse motion artifact affecting the vertebral arteries, which are patent to the dura. Skeleton: Cervical spine degeneration with multilevel ACDF. Other neck: No acute finding. Upper chest: Nodular density in the lingula the level of the hilum, measuring 8 mm-stable from 2011. Review of the MIP images confirms the above findings CTA HEAD FINDINGS Anterior circulation: Significant motion  artifact. No evidence of emergent large vessel occlusion. There is atherosclerotic plaque of the carotid siphons with luminal stenosis that is not well assessed. Posterior circulation: Extensive atherosclerotic plaque. Symmetric patent vertebral arteries and diffusely patent basilar. Symmetric flow in the posterior cerebral arteries which are significantly degraded by motion. Venous sinuses: Unremarkable in the arterial phase Anatomic variants: None significant Review of the MIP images confirms the above findings IMPRESSION: 1. Extensive motion artifact with no evidence of emergent large vessel occlusion. 2. Carotid and vertebral atherosclerosis. Cannot quantify any stenosis due to the degree of artifact. Electronically Signed   By: Monte Fantasia M.D.   On: 04/25/2019 10:34   IR US Guide Vasc Access Left  Result Date: 04/24/2019 INDICATION: 74 year old with end-stage renal disease. Left brachiocephalic fistula was placed in 2019 and recently started using for hemodialysis. Difficult cannulation of the fistula and concern for fistula infiltration. Request for fistulagram. EXAM: LEFT UPPER EXTREMITY FISTULOGRAM ULTRASOUND GUIDANCE FOR VASCULAR ACCESS MEDICATIONS: None. ANESTHESIA/SEDATION: None FLUOROSCOPY TIME:  Fluoroscopy Time: 48 seconds, 7 mGy CONTRAST:  40 mL Omnipaque 299 COMPLICATIONS: None immediate. PROCEDURE: Informed consent was obtained for a fistulogram.  Maximal Sterile Barrier Technique was utilized including caps, mask, sterile gowns, sterile gloves, sterile drape, hand hygiene and skin antiseptic. A timeout was performed prior to the initiation of the procedure. The left antecubital region and upper arm was prepped and draped in sterile fashion. Ultrasound confirmed a patent cephalic vein and fistula. Ultrasound images were saved for documentation. Skin was anesthetized with 1% lidocaine. Using ultrasound guidance, 21 gauge needle was directed into the cephalic vein near the anastomosis.  Puncturing the vein was mildly difficult due to the tortuosity of the vessel. Vein was successfully punctured with ultrasound guidance and micropuncture dilator set was placed. A series of fistulogram were obtained. Multiple reflux images were obtained in different projections in order to evaluate the arterial anastomosis. Catheter was removed with manual compression. FINDINGS: Left brachiocephalic fistula is patent. Small collateral vessels in the mid humeral region draining into the brachial venous system. Central veins are patent. Patient has a right jugular Port-A-Cath with the tip at the SVC and right atrium junction. Arterial anastomosis is widely patent. Approximately 4-5 cm from the arterial anastomosis, there is segment of tortuosity and narrowing in the cephalic vein. In addition, there is evidence for a small dissection flap along the anterior aspect of the cephalic vein near the puncture site. IMPRESSION: 1. Left brachiocephalic fistula is patent. 2. Focal area of tortuosity and narrowing in the cephalic vein approximately 4-5 cm from the anastomosis. This narrowing appears to be related to the tortuosity rather than intimal hyperplasia. No intervention was performed. 3. Short segment intimal flap or non flow limiting dissection along the anterior aspect of the cephalic vein near the cannulation site. This small dissection may be iatrogenic from the procedure. ACCESS: This access remains amenable to future percutaneous interventions as clinically indicated. Electronically Signed   By: Markus Daft M.D.   On: 04/24/2019 15:13   IR DIALY SHUNT INTRO NEEDLE/INTRACATH INITIAL W/IMG LEFT  Result Date: 04/24/2019 INDICATION: 74 year old with end-stage renal disease. Left brachiocephalic fistula was placed in 2019 and recently started using for hemodialysis. Difficult cannulation of the fistula and concern for fistula infiltration. Request for fistulagram. EXAM: LEFT UPPER EXTREMITY FISTULOGRAM ULTRASOUND  GUIDANCE FOR VASCULAR ACCESS MEDICATIONS: None. ANESTHESIA/SEDATION: None FLUOROSCOPY TIME:  Fluoroscopy Time: 48 seconds, 7 mGy CONTRAST:  40 mL Omnipaque [id number] COMPLICATIONS: None immediate. PROCEDURE: Informed consent was obtained for a fistulogram. Maximal Sterile Barrier Technique was utilized including caps, mask, sterile gowns, sterile gloves, sterile drape, hand hygiene and skin antiseptic. A timeout was performed prior to the initiation of the procedure. The left antecubital region and upper arm was prepped and draped in sterile fashion. Ultrasound confirmed a patent cephalic vein and fistula. Ultrasound images were saved for documentation. Skin was anesthetized with 1% lidocaine. Using ultrasound guidance, 21 gauge needle was directed into the cephalic vein near the anastomosis. Puncturing the vein was mildly difficult due to the tortuosity of the vessel. Vein was successfully punctured with ultrasound guidance and micropuncture dilator set was placed. A series of fistulogram were obtained. Multiple reflux images were obtained in different projections in order to evaluate the arterial anastomosis. Catheter was removed with manual compression. FINDINGS: Left brachiocephalic fistula is patent. Small collateral vessels in the mid humeral region draining into the brachial venous system. Central veins are patent. Patient has a right jugular Port-A-Cath with the tip at the SVC and right atrium junction. Arterial anastomosis is widely patent. Approximately 4-5 cm from the arterial anastomosis, there is segment of tortuosity and narrowing in the cephalic vein.  In addition, there is evidence for a small dissection flap along the anterior aspect of the cephalic vein near the puncture site. IMPRESSION: 1. Left brachiocephalic fistula is patent. 2. Focal area of tortuosity and narrowing in the cephalic vein approximately 4-5 cm from the anastomosis. This narrowing appears to be related to the tortuosity rather than  intimal hyperplasia. No intervention was performed. 3. Short segment intimal flap or non flow limiting dissection along the anterior aspect of the cephalic vein near the cannulation site. This small dissection may be iatrogenic from the procedure. ACCESS: This access remains amenable to future percutaneous interventions as clinically indicated. Electronically Signed   By: Markus Daft M.D.   On: 04/24/2019 15:13   CT HEAD CODE STROKE WO CONTRAST  Result Date: 04/25/2019 CLINICAL DATA:  Code stroke.  Aphasia. EXAM: CT HEAD WITHOUT CONTRAST TECHNIQUE: Contiguous axial images were obtained from the base of the skull through the vertex without intravenous contrast. COMPARISON:  Brain MRI 04/22/2019, head CT 04/20/2019 FINDINGS: Brain: There is no evidence of acute intracranial hemorrhage, intracranial mass, midline shift or extra-axial fluid collection.No acute demarcated cortical infarct is demonstrated. Redemonstrated chronic ischemic changes within the cerebral white matter, deep gray nuclei and pons. Stable, mild generalized parenchymal atrophy. Vascular: No hyperdense vessel.  Atherosclerotic calcifications. Skull: Normal. Negative for fracture or focal lesion. Redemonstrated small left frontal calvarial exostosis. Sinuses/Orbits: Visualized orbits demonstrate no acute abnormality. Mild ethmoid and left sphenoid sinus mucosal thickening. No significant mastoid effusion. These results were communicated to Dr. Willaim Bane 10:20 amon 4/14/2021by text page via the [facility] messaging system. IMPRESSION: No CT evidence of acute intracranial abnormality. Stable appearance of generalized parenchymal atrophy and chronic small vessel ischemic disease. Electronically Signed   By: Kellie Simmering DO   On: 04/25/2019 10:20        Scheduled Meds: . acidophilus  1 capsule Oral Daily  . aspirin EC  81 mg Oral QHS  . atorvastatin  40 mg Oral QHS  . calcitRIOL  0.25 mcg Oral Daily  . Chlorhexidine Gluconate Cloth  6 each  Topical Q0600  . darbepoetin (ARANESP) injection - DIALYSIS  60 mcg Intravenous Q Sat-HD  . famotidine  20 mg Oral QHS  . furosemide  20 mg Oral Once per day on Tue Fri  . gabapentin  100 mg Oral TID  . heparin  5,000 Units Subcutaneous Q8H  . insulin aspart  0-5 Units Subcutaneous QHS  . insulin aspart  0-6 Units Subcutaneous TID WC  . isosorbide mononitrate  60 mg Oral Daily  . loratadine  10 mg Oral Daily  . LORazepam      . LORazepam  1 mg Intravenous Once  . metoprolol tartrate  50 mg Oral BID  . pantoprazole  40 mg Oral Daily  . sodium chloride flush  3 mL Intravenous Once   Continuous Infusions: . ferric gluconate (FERRLECIT/NULECIT) IV 125 mg (04/23/19 1502)     LOS: 5 days    Time spent: 42 minutes spent on chart review, discussion with nursing staff, consultants, updating family and interview/physical exam; more than 50% of that time was spent in counseling and/or coordination of care.    Jarrod Mcenery J [place] DO [facility] Available via Epic secure chat 7am-7pm After these hours, please refer to coverage provider listed on [email or web address] 04/25/2019, 11:04 AM

## 2019-04-25 NOTE — Progress Notes (Signed)
Patient transferred to [place] accompanied by 2 nurses.

## 2019-04-25 NOTE — Progress Notes (Signed)
Patient ID: Dana Jackson, female   DOB: 1945/09/29, 74 y.o.   MRN: [id number] [facility] Progress Note   Assessment/ Plan:   1.  Acute encephalopathy/neurological changes: Initially suspected to be from underlying dementia and uremic encephalopathy-today's developments of unclear etiology and neurology is obtaining an EEG after MRI/MRA did not show acute CVA. 2. ESRD: New start on dialysis with development of a constellation of uremic symptoms; had fistulogram yesterday for cannulation difficulties that showed a tortuous vein rather than focal stenosis .  Unfortunately cannulation yesterday was again difficult with infiltration after about 2 hours of dialysis-I will order for a tunneled hemodialysis catheter either tomorrow or on Friday based on the work-up and management of acute neurological changes. 3. Anemia: Hemoglobin and hematocrit are borderline low, on intravenous iron and ESA. 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 5. Nutrition: Continue renal diet with protein supplementation and monitor with hemodialysis. 6. Hypertension: Blood pressures elevated, monitor with hemodialysis/ongoing therapy.  Subjective:   Acute neurological changes (right-sided facial droop/right arm weakness and slurred speech preceded by confusion) this morning for which code stroke was activated-per discussion with neurology, does not appear to have stroke based on preliminary reports of imaging.   Objective:   BP 131/66 (BP Location: Right Arm)   Pulse 72   Temp (!) 97.5 F (36.4 C) (Oral)   Resp 20   Ht 5' (1.524 m)   Wt 77.2 kg   SpO2 95%   BMI 33.24 kg/m   Physical Exam: Gen: Appears somnolent resting in bed CVS: Pulse regular rhythm, normal rate, S1 and S2 normal Resp: Clear to auscultation, no rales/rhonchi Abd: Soft, obese, nontender Ext: Trace lower extremity edema.  Left upper arm AV fistula with pulsatile inflow and mid-upper arm  ecchymosis/hematoma  Labs: BMET Recent Labs  Lab 04/20/19 1331 04/21/19 0525 04/24/19 0404 04/25/19 0822  NA 138 140 138 137  K 5.7* 4.4 4.3 4.1  CL 107 106 105 100  CO2 24 25 23 26   GLUCOSE 130* 98 122* 188*  BUN 42* 43* 50* 35*  CREATININE 4.43* 4.37* 4.76* 4.03*  CALCIUM 9.5 9.4 9.6 9.5  PHOS  --  4.4  --   --    CBC Recent Labs  Lab 04/20/19 1331 04/21/19 0525 04/24/19 1420 04/25/19 0822  WBC 6.4 5.5 5.8 7.7  HGB 9.9* 9.5* 10.0* 10.0*  HCT 30.7* 28.9* 30.9* 30.3*  MCV 94.5 92.0 92.0 91.0  PLT 202 179 186 205      Medications:    . acidophilus  1 capsule Oral Daily  . aspirin EC  81 mg Oral QHS  . atorvastatin  40 mg Oral QHS  . calcitRIOL  0.25 mcg Oral Daily  . Chlorhexidine Gluconate Cloth  6 each Topical Q0600  . darbepoetin (ARANESP) injection - DIALYSIS  60 mcg Intravenous Q Sat-HD  . famotidine  20 mg Oral QHS  . furosemide  20 mg Oral Once per day on Tue Fri  . gabapentin  100 mg Oral TID  . heparin  5,000 Units Subcutaneous Q8H  . insulin aspart  0-5 Units Subcutaneous QHS  . insulin aspart  0-6 Units Subcutaneous TID WC  . isosorbide mononitrate  60 mg Oral Daily  . loratadine  10 mg Oral Daily  . LORazepam      . LORazepam  1 mg Intravenous Once  . metoprolol tartrate  50 mg Oral BID  . pantoprazole  40 mg Oral Daily  .  sodium chloride flush  3 mL Intravenous Once   Elmarie Shiley, MD 04/25/2019, 11:09 AM

## 2019-04-25 NOTE — Progress Notes (Signed)
MD aware of change of patient's status.  Is NPO at this time and unable to take antihypertensives.

## 2019-04-25 NOTE — Progress Notes (Signed)
Code Stroke activated. Neurologist in attendance Symptoms initially resolved, but then she experienced a neurological decline AEB inability to follow commands and increased aphasia.and confusion.  Order for CTA obtained after initial CT.

## 2019-04-25 NOTE — Progress Notes (Addendum)
Pt exhibiting slurred speech / right facial droop.  States has numbness in right arm. Drift noted in right leg / arm at this time. Rapid response nurse notified.  This occurred within the past 5 minutes when patient returned to bed with nurse.

## 2019-04-25 NOTE — Progress Notes (Signed)
Patient very confused and adamantly refusing to have IV access / portacath access.  Does not comprehend that she is in the hospital or rationale for being here.  Will contact husband to come in early to sit with her.

## 2019-04-25 NOTE — Progress Notes (Signed)
EEG complete - results pending 

## 2019-04-26 ENCOUNTER — Inpatient Hospital Stay (HOSPITAL_COMMUNITY): Payer: Medicare PPO

## 2019-04-26 DIAGNOSIS — R4182 Altered mental status, unspecified: Secondary | ICD-10-CM | POA: Diagnosis not present

## 2019-04-26 DIAGNOSIS — R410 Disorientation, unspecified: Secondary | ICD-10-CM | POA: Diagnosis not present

## 2019-04-26 HISTORY — PX: IR US GUIDE VASC ACCESS LEFT: IMG2389

## 2019-04-26 HISTORY — PX: IR FLUORO GUIDE CV LINE LEFT: IMG2282

## 2019-04-26 LAB — BASIC METABOLIC PANEL
Anion gap: 13 (ref 5–15)
BUN: 39 mg/dL — ABNORMAL HIGH (ref 8–23)
CO2: 23 mmol/L (ref 22–32)
Calcium: 9.8 mg/dL (ref 8.9–10.3)
Chloride: 102 mmol/L (ref 98–111)
Creatinine, Ser: 4.86 mg/dL — ABNORMAL HIGH (ref 0.44–1.00)
GFR calc Af Amer: 10 mL/min — ABNORMAL LOW (ref >60)
GFR calc non Af Amer: 8 mL/min — ABNORMAL LOW (ref >60)
Glucose, Bld: 107 mg/dL — ABNORMAL HIGH (ref 70–99)
Potassium: 4 mmol/L (ref 3.5–5.1)
Sodium: 138 mmol/L (ref 135–145)

## 2019-04-26 LAB — GLUCOSE, CAPILLARY
Glucose-Capillary: 122 mg/dL — ABNORMAL HIGH (ref 70–99)
Glucose-Capillary: 139 mg/dL — ABNORMAL HIGH (ref 70–99)
Glucose-Capillary: 125 mg/dL — ABNORMAL HIGH (ref 70–99)
Glucose-Capillary: 144 mg/dL — ABNORMAL HIGH (ref 70–99)

## 2019-04-26 LAB — TSH: TSH: 2.028 u[IU]/mL (ref 0.350–4.500)

## 2019-04-26 LAB — MAGNESIUM: Magnesium: 2.1 mg/dL (ref 1.7–2.4)

## 2019-04-26 LAB — PHOSPHORUS: Phosphorus: 4.8 mg/dL — ABNORMAL HIGH (ref 2.5–4.6)

## 2019-04-26 MED ORDER — CYANOCOBALAMIN 1000 MCG/ML IJ SOLN
1000.0000 ug | Freq: Every day | INTRAMUSCULAR | Status: DC
  Administered 2019-04-26 – 2019-04-29 (×4): 1000 ug via INTRAMUSCULAR
  Filled 2019-04-26 (×4): qty 1

## 2019-04-26 MED ORDER — FENTANYL CITRATE (PF) 100 MCG/2ML IJ SOLN
INTRAMUSCULAR | Status: AC
  Filled 2019-04-26: qty 2

## 2019-04-26 MED ORDER — MIDAZOLAM HCL 2 MG/2ML IJ SOLN
INTRAMUSCULAR | Status: AC
  Filled 2019-04-26: qty 2

## 2019-04-26 MED ORDER — CEFAZOLIN SODIUM-DEXTROSE 2-4 GM/100ML-% IV SOLN
INTRAVENOUS | Status: AC
  Administered 2019-04-26: 15:00:00 2000 mg via INTRAVENOUS
  Filled 2019-04-26: qty 100

## 2019-04-26 MED ORDER — CEFAZOLIN SODIUM-DEXTROSE 2-4 GM/100ML-% IV SOLN
2.0000 g | INTRAVENOUS | Status: AC
  Filled 2019-04-26: qty 100

## 2019-04-26 MED ORDER — LIDOCAINE HCL 1 % IJ SOLN
INTRAMUSCULAR | Status: AC
  Filled 2019-04-26: qty 20

## 2019-04-26 MED ORDER — LIDOCAINE HCL 1 % IJ SOLN
INTRAMUSCULAR | Status: AC | PRN
  Administered 2019-04-26: 10 mL

## 2019-04-26 MED ORDER — FENTANYL CITRATE (PF) 100 MCG/2ML IJ SOLN
INTRAMUSCULAR | Status: AC | PRN
  Administered 2019-04-26: 25 ug via INTRAVENOUS
  Administered 2019-04-26: 50 ug via INTRAVENOUS

## 2019-04-26 MED ORDER — HEPARIN SODIUM (PORCINE) 5000 UNIT/ML IJ SOLN
5000.0000 [IU] | Freq: Three times a day (TID) | INTRAMUSCULAR | Status: DC
  Administered 2019-04-26 – 2019-04-29 (×7): 5000 [IU] via SUBCUTANEOUS
  Filled 2019-04-26 (×7): qty 1

## 2019-04-26 MED ORDER — HEPARIN SODIUM (PORCINE) 1000 UNIT/ML IJ SOLN
INTRAMUSCULAR | Status: AC
  Filled 2019-04-26: qty 1

## 2019-04-26 MED ORDER — MIDAZOLAM HCL 2 MG/2ML IJ SOLN
INTRAMUSCULAR | Status: AC | PRN
  Administered 2019-04-26: 1 mg via INTRAVENOUS

## 2019-04-26 MED ORDER — GELATIN ABSORBABLE 12-7 MM EX MISC
CUTANEOUS | Status: AC
  Filled 2019-04-26: qty 1

## 2019-04-26 NOTE — Progress Notes (Signed)
OP HD referral submitted to [facility] to request treatment for ESRD at patient's discharge from the hospital.  Renal Navigator will follow closely.  Alphonzo Cruise, [facility] [phone]

## 2019-04-26 NOTE — Progress Notes (Signed)
Patient ID: Dana Jackson, female   DOB: 08/19/1945, 74 y.o.   MRN: [id number]  [facility] Progress Note   Assessment/ Plan:   1.  Acute encephalopathy/neurological changes: Initially suspected to be from underlying dementia and uremic encephalopathy-today's developments of unclear etiology and ongoing neurological evaluation after cranial imaging negative for acute CVA and EEG negative for epileptiform activity. 2. ESRD: New start on dialysis with development of a constellation of uremic symptoms; had fistulogram yesterday for cannulation difficulties that showed a tortuous vein rather than focal stenosis .  Given continued cannulation difficulties, will order for placement of tunneled hemodialysis catheter in order for hemodialysis tomorrow; labs/clinical status without acute need today. 3. Anemia: Hemoglobin and hematocrit are borderline low, on intravenous iron and ESA. 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 5. Nutrition: Continue renal diet with protein supplementation and monitor with hemodialysis. 6. Hypertension: Blood pressures elevated, monitor with hemodialysis/ongoing therapy.  Subjective:   Acute neurological changes yesterday evaluated by stroke team-no acute evidence of CVA.  Thereafter had EEG that did not show epileptiform focus but showed diffuse encephalopathy.   Objective:   BP (!) 131/91   Pulse 88   Temp 98.2 F (36.8 C) (Oral)   Resp 17   Ht 5' (1.524 m)   Wt 78 kg   SpO2 96%   BMI 33.58 kg/m   Physical Exam: Gen: Sitting comfortably up in recliner, pleasant and slightly confused.  Daughter at her side. CVS: Pulse regular rhythm, normal rate, S1 and S2 normal Resp: Clear to auscultation, no rales/rhonchi Abd: Soft, obese, nontender Ext: No lower extremity edema.  Left upper arm AV fistula with pulsatile inflow and large nonpulsatile mid-upper arm ecchymosis  Labs: BMET Recent Labs  Lab  04/20/19 1331 04/21/19 0525 04/24/19 0404 04/25/19 0822 04/26/19 0446  NA 138 140 138 137 138  K 5.7* 4.4 4.3 4.1 4.0  CL 107 106 105 100 102  CO2 24 25 23 26 23   GLUCOSE 130* 98 122* 188* 107*  BUN 42* 43* 50* 35* 39*  CREATININE 4.43* 4.37* 4.76* 4.03* 4.86*  CALCIUM 9.5 9.4 9.6 9.5 9.8  PHOS  --  4.4  --   --  4.8*   CBC Recent Labs  Lab 04/20/19 1331 04/21/19 0525 04/24/19 1420 04/25/19 0822  WBC 6.4 5.5 5.8 7.7  HGB 9.9* 9.5* 10.0* 10.0*  HCT 30.7* 28.9* 30.9* 30.3*  MCV 94.5 92.0 92.0 91.0  PLT 202 179 186 205      Medications:    . acidophilus  1 capsule Oral Daily  . aspirin EC  81 mg Oral QHS  . atorvastatin  40 mg Oral QHS  . calcitRIOL  0.25 mcg Oral Daily  . Chlorhexidine Gluconate Cloth  6 each Topical Q0600  . darbepoetin (ARANESP) injection - DIALYSIS  60 mcg Intravenous Q Sat-HD  . famotidine  20 mg Oral QHS  . furosemide  20 mg Oral Once per day on Tue Fri  . gabapentin  100 mg Oral TID  . heparin  5,000 Units Subcutaneous Q8H  . insulin aspart  0-5 Units Subcutaneous QHS  . insulin aspart  0-6 Units Subcutaneous TID WC  . isosorbide mononitrate  60 mg Oral Daily  . loratadine  10 mg Oral Daily  . metoprolol tartrate  50 mg Oral BID  . pantoprazole  40 mg Oral Daily   Elmarie Shiley, MD 04/26/2019, 10:47 AM

## 2019-04-26 NOTE — Progress Notes (Signed)
PROGRESS NOTE    Dana Jackson  OAC:[id number] DOB: [birth year] DOA: 04/20/2019 PCP: Heywood Bene, PA-C    Brief Narrative:   Dana Jackson is a 74 y.o. female  with medical history significant ofchronic kidney disease stage V not yet on hemodialysis but dialysis catheter in place, hypertension, hyperlipidemia, previous CVA, renal cell carcinoma, diabetes, coronary artery disease status post coronary artery bypass graft, status post left nephrectomy who presented to the ER with altered mental status. Husband is accompanying patient. She has apparently been progressively getting confused but mainly in the last 3 days. No obvious source of her confusion. HD was initiated in the hospital   Assessment & Plan:   Principal Problem:   Delirium Active Problems:   Renal cell carcinoma (HCC)   Essential hypertension   S/P CABG x 3   Anemia   Hyperlipidemia LDL goal <70   Obesity (BMI 30-39.9)   Type 2 diabetes mellitus with renal manifestations, controlled (HCC)   Hyperkalemia   CAD (coronary artery disease) of artery bypass graft   Acute encephalopathy   TIA vs acute metabolic encephalopathy vs acute delirium in the setting of dementia Notified by nursing this morning regarding each, right facial droop and numbness to right arm with associated drift noted to right arm/leg.  Rapid response/code stroke was initiated.  CT head 04/25/2019 with no evidence of acute intracranial normality, stable appearance of generalized parenchymal atrophy and chronic small vessel ischemic disease.  CT angio head/neck artifact, no evidence of emergence large vessel occlusion, carotid/vertebral atherosclerosis but cannot quantify stenosis due to degree of artifact.  MR brain negative for acute infarct, no brain edema; but notable for chronic small vessel ischemia.  EEG with moderate diffuse encephalopathy, nonspecific with no seizures or epileptiform discharges appreciated. --Neurology following,  appreciate assistance; await further recommendations  Acute delirium 2/2 uremia vs worsening dementia: Questionable etiology but most likely multifactorial; patient with likely some underlying dementia.  MR brain with worsening white matter changes, repeat MR brain this morning for concerns of acute CVA versus TIA negative for acute infarct or brain edema. -B12 lower end of normal so will replete x2 during hospitalization  Chronic kidney disease stage V now progressed to end-stage: Patient was evaluated by IR regarding a left arm fistula given difficulty accessing with HD during this hospitalization.  Fistulogram noted that fistula widely patent with  Focal tortuous and narrowed segment just beyond the artery.  --Patient had HD done on 4/10 and 4/12 --Nephrology plans to place tunneled dialysis catheter due to difficulties with cannulation of fistula today --Still awaiting outpatient HD bed assignment; referred to the [facility] clinic  Type 2 diabetes mellitus: Hemoglobin A1c 5.7 on 03/27/2019, well controlled.  Diet controlled at home. --Continue insulin sliding scale while inpatient  Essential hypertension:  BP 131/48 this morning, well controlled. --Metoprolol tartrate 50 mg p.o. twice daily --Continue aspirin and statin --Continue to monitor blood pressure closely  Hyperkalemia: Patient treated in the ER and improved with HD.  Potassium 4.0 today. --Continue HD per nephrology  Obesity Body mass index is 31.99 kg/m.  HLD: Continue atorvastatin 40 mg p.o. daily   DVT prophylaxis: Heparin Code Status: Full code Family Communication: Discussed with patient's husband who is present at bedside  Disposition: Status is: Inpatient  Remains inpatient appropriate because:Altered mental status, Ongoing diagnostic testing needed not appropriate for outpatient work up, Unsafe d/c plan, Inpatient level of care appropriate due to severity of illness and Awaiting outpatient HD  bed   Dispo: The  patient is from: Home              Anticipated d/c is to: Home              Anticipated d/c date is: 3 days              Patient currently is not medically stable to d/c.   Consultants:   Nephrology  Neurology  Procedures:  Left arm fistulogram 04/24/2019 Left brachiocephalic fistula is patent.  Focal tortuous and narrowed segment just beyond the artery.  Arterial anastomosis is widely patent.  Small dissection flap (not flow limiting) near puncture site. Central veins patent.   EEG 04/25/2019: IMPRESSION: This study suggestive of moderate diffuse encephalopathy, nonspecific to etiology.No seizures or epileptiform discharges were seen throughout the recording.  Event button was pressed multiple times on 4/40/2021 and 04/26/2019 as described above without concomitant EEG change and therefore not epileptic.  Antimicrobials:   None   Subjective:  Patient seen and examined at bedside this morning, husband present.  Continues with some confusion, appears overall better than yesterday.  No focal weakness.  Neurological work-up to include CT head, CTA head/neck, MRI brain and EEG unrevealing.  Awaiting tunneled HD catheter placement by IR for difficulties cannulating her fistula.  Also still awaiting for outpatient HD bed placement.  No specific complaints or concerns this morning.  Denies headache, no fever/chills/night sweats, no chest pain, no palpitations, no shortness of breath, no abdominal pain.  No acute events overnight per nursing staff.  Objective: Vitals:   04/26/19 0100 04/26/19 0423 04/26/19 0743 04/26/19 1003  BP:  (!) 131/48 (!) 165/92 (!) 131/91  Pulse:  94 94 88  Resp:  20 17   Temp:  98.2 F (36.8 C)    TempSrc:  Oral    SpO2: 95% 96% 96%   Weight:  78 kg    Height:        Intake/Output Summary (Last 24 hours) at 04/26/2019 1516 Last data filed at 04/26/2019 0830 Gross per 24 hour  Intake 130 ml  Output --  Net 130 ml   Filed Weights    04/24/19 1608 04/25/19 0500 04/26/19 0423  Weight: 77.6 kg 77.2 kg 78 kg    Examination:  General exam: Appears calm and comfortable, pleasantly confused Respiratory system: Clear to auscultation. Respiratory effort normal.  Oxygenating well on room air Cardiovascular system: S1 & S2 heard, RRR. No JVD, murmurs, rubs, gallops or clicks. No pedal edema. Gastrointestinal system: Abdomen is nondistended, soft and nontender. No organomegaly or masses felt. Normal bowel sounds heard. Central nervous system: Alert, oriented to time and place but not person and situation Extremities: Symmetric 5 x 5 power. Skin: No rashes, lesions or ulcers Psychiatry: Judgement and insight appear poor. Mood & affect appropriate.     Data Reviewed: I have personally reviewed following labs and imaging studies  CBC: Recent Labs  Lab 04/20/19 1331 04/21/19 0525 04/24/19 1420 04/25/19 0822  WBC 6.4 5.5 5.8 7.7  HGB 9.9* 9.5* 10.0* 10.0*  HCT 30.7* 28.9* 30.9* 30.3*  MCV 94.5 92.0 92.0 91.0  PLT 202 179 186 619   Basic Metabolic Panel: Recent Labs  Lab 04/20/19 1331 04/21/19 0525 04/24/19 0404 04/25/19 0822 04/26/19 0446  NA 138 140 138 137 138  K 5.7* 4.4 4.3 4.1 4.0  CL 107 106 105 100 102  CO2 24 25 23 26 23   GLUCOSE 130* 98 122* 188* 107*  BUN 42* 43* 50* 35* 39*  CREATININE  4.43* 4.37* 4.76* 4.03* 4.86*  CALCIUM 9.5 9.4 9.6 9.5 9.8  MG  --   --   --   --  2.1  PHOS  --  4.4  --   --  4.8*   GFR: Estimated Creatinine Clearance: 9.4 mL/min (A) (by C-G formula based on SCr of 4.86 mg/dL (H)). Liver Function Tests: Recent Labs  Lab 04/20/19 1331 04/21/19 0525 04/25/19 0822  AST 18 16 40  ALT 25 22 41  ALKPHOS 90 82 86  BILITOT 0.5 0.6 0.9  PROT 5.4* 5.2* 5.4*  ALBUMIN 3.0* 2.8* 3.0*   No results for input(s): LIPASE, AMYLASE in the last 168 hours. Recent Labs  Lab 04/22/19 0603  AMMONIA 21   Coagulation Profile: No results for input(s): INR, PROTIME in the last 168  hours. Cardiac Enzymes: No results for input(s): CKTOTAL, CKMB, CKMBINDEX, TROPONINI in the last 168 hours. BNP (last 3 results) No results for input(s): PROBNP in the last 8760 hours. HbA1C: No results for input(s): HGBA1C in the last 72 hours. CBG: Recent Labs  Lab 04/25/19 1123 04/25/19 1623 04/25/19 2237 04/26/19 0740 04/26/19 1140  GLUCAP 96 102* 95 122* 139*   Lipid Profile: No results for input(s): CHOL, HDL, LDLCALC, TRIG, CHOLHDL, LDLDIRECT in the last 72 hours. Thyroid Function Tests: Recent Labs    04/26/19 0446  TSH 2.028   Anemia Panel: No results for input(s): VITAMINB12, FOLATE, FERRITIN, TIBC, IRON, RETICCTPCT in the last 72 hours. Sepsis Labs: No results for input(s): PROCALCITON, LATICACIDVEN in the last 168 hours.  Recent Results (from the past 240 hour(s))  SARS CORONAVIRUS 2 (TAT 6-24 HRS) Nasopharyngeal Nasopharyngeal Swab     Status: None   Collection Time: 04/20/19 10:11 PM   Specimen: Nasopharyngeal Swab  Result Value Ref Range Status   SARS Coronavirus 2 NEGATIVE NEGATIVE Final    Comment: (NOTE) SARS-CoV-2 target nucleic acids are NOT DETECTED. The SARS-CoV-2 RNA is generally detectable in upper and lower respiratory specimens during the acute phase of infection. Negative results do not preclude SARS-CoV-2 infection, do not rule out co-infections with other pathogens, and should not be used as the sole basis for treatment or other patient management decisions. Negative results must be combined with clinical observations, patient history, and epidemiological information. The expected result is Negative. Fact Sheet for Patients: SugarRoll.be Fact Sheet for Healthcare Providers: https://www.woods-mathews.com/ This test is not yet approved or cleared by the [place] FDA and  has been authorized for detection and/or diagnosis of SARS-CoV-2 by FDA under an Emergency Use Authorization (EUA). This EUA  will remain  in effect (meaning this test can be used) for the duration of the COVID-19 declaration under Section 56 4(b)(1) of the Act, 21 U.S.C. section 360bbb-3(b)(1), unless the authorization is terminated or revoked sooner. Performed at [facility], [place] [address], [place], [place] [address]          Radiology Studies: CT ANGIO HEAD W OR WO CONTRAST  Result Date: 04/25/2019 CLINICAL DATA:  Aphasia. EXAM: CT ANGIOGRAPHY HEAD AND NECK TECHNIQUE: Multidetector CT imaging of the head and neck was performed using the standard protocol during bolus administration of intravenous contrast. Multiplanar CT image reconstructions and MIPs were obtained to evaluate the vascular anatomy. Carotid stenosis measurements (when applicable) are obtained utilizing NASCET criteria, using the distal internal carotid diameter as the denominator. CONTRAST:  Dose is currently not available COMPARISON:  04/20/2019 head CT and 04/22/2019 brain MRI FINDINGS: CTA NECK FINDINGS Aortic arch: Status post  CABG. Atherosclerosis without acute finding such as dissection. Right carotid system: Significant motion degradation throughout the neck. There is calcified plaque at the bifurcation with no possible estimate of narrowing. Left carotid system: Extensive motion artifact throughout the neck. There is calcified plaque at the bifurcation with some degree of stenosis that is not quantifiable. Vertebral arteries: No proximal subclavian flow limiting stenosis. There is calcified plaque at both subclavian arteries. Diffuse motion artifact affecting the vertebral arteries, which are patent to the dura. Skeleton: Cervical spine degeneration with multilevel ACDF. Other neck: No acute finding. Upper chest: Nodular density in the lingula the level of the hilum, measuring 8 mm-stable from 2011. Review of the MIP images confirms the above findings CTA HEAD FINDINGS Anterior circulation: Significant motion artifact. No evidence of  emergent large vessel occlusion. There is atherosclerotic plaque of the carotid siphons with luminal stenosis that is not well assessed. Posterior circulation: Extensive atherosclerotic plaque. Symmetric patent vertebral arteries and diffusely patent basilar. Symmetric flow in the posterior cerebral arteries which are significantly degraded by motion. Venous sinuses: Unremarkable in the arterial phase Anatomic variants: None significant Review of the MIP images confirms the above findings IMPRESSION: 1. Extensive motion artifact with no evidence of emergent large vessel occlusion. 2. Carotid and vertebral atherosclerosis. Cannot quantify any stenosis due to the degree of artifact. Electronically Signed   By: Monte Fantasia M.D.   On: 04/25/2019 10:34   CT ANGIO NECK W OR WO CONTRAST  Result Date: 04/25/2019 CLINICAL DATA:  Aphasia. EXAM: CT ANGIOGRAPHY HEAD AND NECK TECHNIQUE: Multidetector CT imaging of the head and neck was performed using the standard protocol during bolus administration of intravenous contrast. Multiplanar CT image reconstructions and MIPs were obtained to evaluate the vascular anatomy. Carotid stenosis measurements (when applicable) are obtained utilizing NASCET criteria, using the distal internal carotid diameter as the denominator. CONTRAST:  Dose is currently not available COMPARISON:  04/20/2019 head CT and 04/22/2019 brain MRI FINDINGS: CTA NECK FINDINGS Aortic arch: Status post CABG. Atherosclerosis without acute finding such as dissection. Right carotid system: Significant motion degradation throughout the neck. There is calcified plaque at the bifurcation with no possible estimate of narrowing. Left carotid system: Extensive motion artifact throughout the neck. There is calcified plaque at the bifurcation with some degree of stenosis that is not quantifiable. Vertebral arteries: No proximal subclavian flow limiting stenosis. There is calcified plaque at both subclavian arteries.  Diffuse motion artifact affecting the vertebral arteries, which are patent to the dura. Skeleton: Cervical spine degeneration with multilevel ACDF. Other neck: No acute finding. Upper chest: Nodular density in the lingula the level of the hilum, measuring 8 mm-stable from 2011. Review of the MIP images confirms the above findings CTA HEAD FINDINGS Anterior circulation: Significant motion artifact. No evidence of emergent large vessel occlusion. There is atherosclerotic plaque of the carotid siphons with luminal stenosis that is not well assessed. Posterior circulation: Extensive atherosclerotic plaque. Symmetric patent vertebral arteries and diffusely patent basilar. Symmetric flow in the posterior cerebral arteries which are significantly degraded by motion. Venous sinuses: Unremarkable in the arterial phase Anatomic variants: None significant Review of the MIP images confirms the above findings IMPRESSION: 1. Extensive motion artifact with no evidence of emergent large vessel occlusion. 2. Carotid and vertebral atherosclerosis. Cannot quantify any stenosis due to the degree of artifact. Electronically Signed   By: Monte Fantasia M.D.   On: 04/25/2019 10:34   MR BRAIN WO CONTRAST  Result Date: 04/25/2019 CLINICAL DATA:  Aphasia. EXAM: MRI  HEAD WITHOUT CONTRAST TECHNIQUE: Multiplanar, multiecho pulse sequences of the brain and surrounding structures were obtained without intravenous contrast. COMPARISON:  Three days ago FINDINGS: Brain: Limited sequence MRI performed in the critical setting of this altered patient. Diffusion, gradient, and FLAIR imaging was performed. Diffusion imaging is good quality in 2 planes ; there is no acute infarct. Moderate chronic small vessel ischemic type change in the cerebral white matter and pons, accentuated by motion artifact. There is central predominant cerebral volume loss. Vascular: No T2 weighted imaging to assess flow voids Skull and upper cervical spine: Left frontal  bone osteoma. No significant finding Sinuses/Orbits: Negative IMPRESSION: 1. Negative for acute infarct or brain edema. 2. Chronic small vessel ischemia in the white matter and pons. 3. Truncated and motion degraded study due to patient condition. Electronically Signed   By: Monte Fantasia M.D.   On: 04/25/2019 11:08   EEG adult  Result Date: 04/25/2019 Lora Havens, MD     04/25/2019 12:59 PM Patient Name: Anhelica Fowers MRN: [id number] Epilepsy Attending: Lora Havens Referring Physician/Provider: Dr. Kathrynn Speed Date: 04/25/2019 Duration: 25.35 minutes Patient history: 74 year old female who was initially admitted for confusion and today was noted to have an episode of right-sided weakness and speech disturbance.  EEG to evaluate for seizures. Level of alertness: Lethargic AEDs during EEG study: None Technical aspects: This EEG study was done with scalp electrodes positioned according to the 10-20 International system of electrode placement. Electrical activity was acquired at a sampling rate of 500Hz  and reviewed with a high frequency filter of 70Hz  and a low frequency filter of 1Hz . EEG data were recorded continuously and digitally stored. Description: EEG showed continuous generalized low amplitude 3 to 5 Hz theta-delta slowing.  No clear posterior dominant rhythm was seen.  Hyperventilation and photic stimulation were not performed. Of note, study was technically difficult due to significant myogenic and electrode artifact. Abnormality -Continuous slow, generalized IMPRESSION: This technically difficult study suggestive of moderate diffuse encephalopathy, nonspecific to etiology.  No seizures or epileptiform discharges were seen throughout the recording. Priyanka Barbra Sarks   Overnight EEG with video  Result Date: 04/26/2019 Lora Havens, MD     04/26/2019 11:58 AM Patient Name: Rosanne Wohlfarth MRN: [id number] Epilepsy Attending: Lora Havens Referring Physician/Provider: Dr. Kathrynn Speed Duration: 04/25/2019 1252 to 04/26/2019 1031 Patient history: 74 year old female who was initially admitted for confusion and today was noted to have an episode of right-sided weakness and speech disturbance.  EEG to evaluate for seizures.  Level of alertness: awake, asleep  AEDs during EEG study: Gabapentin  Technical aspects: This EEG study was done with scalp electrodes positioned according to the 10-20 International system of electrode placement. Electrical activity was acquired at a sampling rate of 500Hz  and reviewed with a high frequency filter of 70Hz  and a low frequency filter of 1Hz . EEG data were recorded continuously and digitally stored.  Description: No clear posterior dominant rhythm was seen. Sleep was characterized by vertex waves, sleep spindles (12-14hz ) maximal frontocentral. EEG showed continuous generalized low amplitude 3 to 5 Hz theta-delta slowing. Hyperventilation and photic stimulation were not performed. Event button was pressed on 04/25/2019 and 04/26/2019 at following times 1413: Husband helping patient move, unclear why button was pressed 1432: Staff helping patient in bed, unclear why button was pressed 1456: Patient off camera, unclear why button was pressed 1830: Staff repositioning patient, unclear why button was pressed 1907: Patient laying with eyes closed in bed, unclear why button was  pressed 2031: Staff repositioning patient, unclear what and was pressed 2057 : Patient laying in bed, staff auscultating patient, unclear why button was pressed 0406: Staff cleaning and talking to patient, unclear why button was pressed 0425: Staff asked patient if she is doing okay patient responded, unclear why button was pressed 0800: Sitting in bed with breakfast tray in front of her, saying something to husband but difficult to hear.  Unclear by button was pressed Concomitant EEG before, during and after all these pushbutton events did not show any EEG changes suggest seizure  activity.  Abnormality -Continuous slow, generalized  IMPRESSION: This study suggestive of moderate diffuse encephalopathy, nonspecific to etiology.  No seizures or epileptiform discharges were seen throughout the recording. Event button was pressed multiple times on 4/40/2021 and 04/26/2019 as described above without concomitant EEG change and therefore not epileptic.  Lora Havens   CT HEAD CODE STROKE WO CONTRAST  Result Date: 04/25/2019 CLINICAL DATA:  Code stroke.  Aphasia. EXAM: CT HEAD WITHOUT CONTRAST TECHNIQUE: Contiguous axial images were obtained from the base of the skull through the vertex without intravenous contrast. COMPARISON:  Brain MRI 04/22/2019, head CT 04/20/2019 FINDINGS: Brain: There is no evidence of acute intracranial hemorrhage, intracranial mass, midline shift or extra-axial fluid collection.No acute demarcated cortical infarct is demonstrated. Redemonstrated chronic ischemic changes within the cerebral white matter, deep gray nuclei and pons. Stable, mild generalized parenchymal atrophy. Vascular: No hyperdense vessel.  Atherosclerotic calcifications. Skull: Normal. Negative for fracture or focal lesion. Redemonstrated small left frontal calvarial exostosis. Sinuses/Orbits: Visualized orbits demonstrate no acute abnormality. Mild ethmoid and left sphenoid sinus mucosal thickening. No significant mastoid effusion. These results were communicated to Dr. Willaim Bane 10:20 amon 4/14/2021by text page via the [facility] messaging system. IMPRESSION: No CT evidence of acute intracranial abnormality. Stable appearance of generalized parenchymal atrophy and chronic small vessel ischemic disease. Electronically Signed   By: Kellie Simmering DO   On: 04/25/2019 10:20        Scheduled Meds: . fentaNYL      . midazolam      . acidophilus  1 capsule Oral Daily  . aspirin EC  81 mg Oral QHS  . atorvastatin  40 mg Oral QHS  . calcitRIOL  0.25 mcg Oral Daily  . Chlorhexidine Gluconate  Cloth  6 each Topical Q0600  . cyanocobalamin  1,000 mcg Intramuscular Daily  . darbepoetin (ARANESP) injection - DIALYSIS  60 mcg Intravenous Q Sat-HD  . famotidine  20 mg Oral QHS  . furosemide  20 mg Oral Once per day on Tue Fri  . gabapentin  100 mg Oral TID  . heparin  5,000 Units Subcutaneous Q8H  . insulin aspart  0-5 Units Subcutaneous QHS  . insulin aspart  0-6 Units Subcutaneous TID WC  . isosorbide mononitrate  60 mg Oral Daily  . loratadine  10 mg Oral Daily  . metoprolol tartrate  50 mg Oral BID  . pantoprazole  40 mg Oral Daily   Continuous Infusions: . ceFAZolin    .  ceFAZolin (ANCEF) IV    . ferric gluconate (FERRLECIT/NULECIT) IV 125 mg (04/23/19 1502)     LOS: 6 days    Time spent: 38 minutes spent on chart review, discussion with nursing staff, consultants, updating family and interview/physical exam; more than 50% of that time was spent in counseling and/or coordination of care.    Abdikadir Fohl J [place] DO [facility] Available via Epic secure chat 7am-7pm After these hours, please refer to  coverage provider listed on [email or web address] 04/26/2019, 3:16 PM

## 2019-04-26 NOTE — Progress Notes (Signed)
LTM EEG discontinued - no skin breakdown at unhook.   

## 2019-04-26 NOTE — Evaluation (Signed)
Occupational Therapy Evaluation Patient Details Name: Dana Jackson MRN: [id number] DOB: [birth year] Today's Date: 04/26/2019    History of Present Illness 74 yo female with history of end-stage renal disease, CAD with prior CABG, prior CVA, left RCC, endom ca, GERD,  anemia, hypertension, uremic encephalopathy/dementia.  She has had persistent difficulties with LUE fistula cannulation and request now received for tunneled dialysis catheter placement.  Presenting with increased confusion x 3 days.   Clinical Impression   PTA patient independent with ADLs, IADLs and mobility.  Admitted for above and limited by problem list below, including impaired cognition, decreased activity tolerance, impaired balance, and generalized weakness.  Patient currently requires min assist for ADLs, transfers and functional mobility.  Oriented to self only, poor attention, sequencing, difficulty following simple commands.  Will have 24/7 support at dc.  Daughter present and reports pt will have assist at dc for meds, IADls. Will follow acutely to optimize independence and safety with ADLs, mobility.     Follow Up Recommendations  Home health OT;Supervision/Assistance - 24 hour    Equipment Recommendations  3 in 1 bedside commode    Recommendations for Other Services Speech consult(cognition)     Precautions / Restrictions Precautions Precautions: Fall Restrictions Weight Bearing Restrictions: No      Mobility Bed Mobility               General bed mobility comments: Pt was in chair on arrival.    Transfers Overall transfer level: Needs assistance Equipment used: Rolling walker (2 wheeled) Transfers: Sit to/from Stand Sit to Stand: Min assist         General transfer comment: min assist to power up and steady     Balance Overall balance assessment: Needs assistance Sitting-balance support: No upper extremity supported;Feet supported Sitting balance-Leahy Scale: Fair     Standing  balance support: Bilateral upper extremity supported;During functional activity Standing balance-Leahy Scale: Poor Standing balance comment: Pt needed UE support bilaterally for standing statically and dynamically.                            ADL either performed or assessed with clinical judgement   ADL Overall ADL's : Needs assistance/impaired     Grooming: Minimal assistance;Sitting Grooming Details (indicate cue type and reason): d/t cognition Upper Body Bathing: Minimal assistance;Sitting   Lower Body Bathing: Minimal assistance;Sit to/from stand   Upper Body Dressing : Minimal assistance;Sitting   Lower Body Dressing: Minimal assistance;Sit to/from stand Lower Body Dressing Details (indicate cue type and reason): assist with R sock, able to don L; min guard sit to stand; contant redirection to task Toilet Transfer: Minimal assistance;Ambulation;RW Toilet Transfer Details (indicate cue type and reason): simulated in room         Functional mobility during ADLs: Minimal assistance;Rolling walker;Cueing for safety;Cueing for sequencing General ADL Comments: pt limited by weakness, cognition, balance      Vision         Perception     Praxis      Pertinent Vitals/Pain Pain Assessment: No/denies pain     Hand Dominance Right   Extremity/Trunk Assessment Upper Extremity Assessment Upper Extremity Assessment: Generalized weakness   Lower Extremity Assessment Lower Extremity Assessment: Defer to PT evaluation   Cervical / Trunk Assessment Cervical / Trunk Assessment: Kyphotic   Communication Communication Communication: No difficulties   Cognition Arousal/Alertness: Awake/alert Behavior During Therapy: Impulsive;Restless Overall Cognitive Status: Impaired/Different from baseline Area of Impairment: Orientation;Attention;Following commands;Memory;Safety/judgement;Awareness;Problem solving  Orientation Level: Disoriented  to;Place;Time;Situation Current Attention Level: Focused Memory: Decreased recall of precautions;Decreased short-term memory Following Commands: Follows one step commands inconsistently;Follows one step commands with increased time Safety/Judgement: Decreased awareness of safety;Decreased awareness of deficits Awareness: Intellectual Problem Solving: Slow processing;Decreased initiation;Difficulty sequencing;Requires verbal cues;Requires tactile cues General Comments: patient only oriented to self, follows simple commands with increased time but requires multimodal cueing and poor attention to task requiring constant redirection    General Comments  daughter present and supportive, reports she will have 24/7 support, agreeable to no cooking or med mgmt at this time     Exercises     Shoulder Instructions      Home Living Family/patient expects to be discharged to:: Private residence Living Arrangements: Spouse/significant other Available Help at Discharge: Family;Available 24 hours/day Type of Home: House Home Access: Stairs to enter [employer] of Steps: flight Entrance Stairs-Rails: Right;Left;Can reach both Home Layout: Two level;Able to live on main level with bedroom/bathroom     Bathroom Shower/Tub: Occupational psychologist: Handicapped height     Home Equipment: Shower seat;Grab bars - tub/shower;Hand held shower head          Prior Functioning/Environment Level of Independence: Independent        Comments: independent ADLs, IADLs         OT Problem List: Decreased strength;Decreased activity tolerance;Impaired balance (sitting and/or standing);Decreased cognition;Decreased safety awareness;Decreased knowledge of use of DME or AE;Decreased knowledge of precautions      OT Treatment/Interventions: Self-care/ADL training;Therapeutic exercise;Therapeutic activities;Cognitive remediation/compensation;Patient/family education;Balance training;DME  and/or AE instruction    OT Goals(Current goals can be found in the care plan section) Acute Rehab OT Goals Patient Stated Goal: to go home OT Goal Formulation: With patient/family Time For Goal Achievement: 05/10/19 Potential to Achieve Goals: Good  OT Frequency: Min 2X/week   Barriers to D/C:            Co-evaluation              AM-PAC OT "6 Clicks" Daily Activity     Outcome Measure Help from another person eating meals?: Total(NPO) Help from another person taking care of personal grooming?: A Little Help from another person toileting, which includes using toliet, bedpan, or urinal?: A Little Help from another person bathing (including washing, rinsing, drying)?: A Little Help from another person to put on and taking off regular upper body clothing?: A Little Help from another person to put on and taking off regular lower body clothing?: A Little 6 Click Score: 16   End of Session Equipment Utilized During Treatment: Rolling walker Nurse Communication: Mobility status  Activity Tolerance: Patient tolerated treatment well Patient left: in chair;with call bell/phone within reach;with chair alarm set;with family/visitor present  OT Visit Diagnosis: Other abnormalities of gait and mobility (R26.89);Muscle weakness (generalized) (M62.81);Other symptoms and signs involving cognitive function                Time: 3810-1751 OT Time Calculation (min): 16 min Charges:  OT General Charges $OT Visit: 1 Visit OT Evaluation $OT Eval Moderate Complexity: 1 Mod  [name], OT Acute Rehabilitation Services Pager [phone] Office [phone]   [name] 04/26/2019, 2:54 PM

## 2019-04-26 NOTE — Progress Notes (Signed)
Call EEG tech on call due to 2 leads are off patient.  I have let patient's husband know that I have call someone to come and look at this.

## 2019-04-26 NOTE — Progress Notes (Signed)
Subjective: She remained confused for most of the day yesterday, greatly improved today.  Still with some confusion per daughter.  Exam: Vitals:   04/26/19 0743 04/26/19 1003  BP: (!) 165/92 (!) 131/91  Pulse: 94 88  Resp: 17   Temp:    SpO2: 96%    Gen: In bed, NAD Resp: non-labored breathing, no acute distress Abd: soft, nt  Neuro: MS: Awake, alert, oriented to month and year, she is able to name objects.  Speech is fluent. CN: Pupils equal and reactive, visual fields full Motor: 5/5 throughout Sensory: Intact light touch  Pertinent Labs: B12 205 Ammonia 21 TSH 2.02  Impression: 74 year old female with episode of aphasia of unclear etiology yesterday morning.  Possibilities include seizure, TIA, amyloid spell.  Without any clear history of episodic events prior to this, I would not start antiepileptic therapy.  The duration is very unusual for TIA given the negative MRI and CTA shortly after the onset of the event.  This would also be unusual for post ictal phenomenon as well, over.  It is possible that the Ativan given for him I clouded the exam in the afternoon.  Her B12 is very low normal and given her history of mental impairment, I do think that parenteral repletion is indicated.  I started this yesterday.  Recommendations: 1) lipids, A1c 2) IM B12 3) if she has any further spells of concern, would consider antiepileptic therapy.  Roland Rack, MD [facility] [phone]  If 7pm- 7am, please page neurology on call as listed in [place].

## 2019-04-26 NOTE — Progress Notes (Signed)
Referring Physician(s): Dana Jackson,J  Supervising Physician: Dana Jackson  Patient Status:  Dana [facility] - In-pt  Chief Complaint:  Renal failure, difficult dialysis fistula cannulation  Subjective: Patient familiar to IR service from left renal lesion biopsy in 2014, and left upper extremity fistulogram on 4/13.  She has a history of end-stage renal disease, anemia, hypertension, uremic encephalopathy/dementia.  She has had persistent difficulties with fistula cannulation and request now received for tunneled dialysis catheter placement.  She currently denies fever, headache, chest pain, dyspnea, cough, abdominal/back pain, nausea, vomiting or bleeding.  Additional medical history as below.  Past Medical History:  Diagnosis Date  . Anemia   . Arthritis    hands  . Asthma    childhood  . Bacteremia due to Escherichia coli June 2015    Currently being treated with anti-biotics  . CAD in native artery 12/2010   a) Cath for exertional angina & EKG changes: 40% LM, 80% mid LAD.  95% ost cX, 80-90% PDA --> CABG X3; b) Post CABG CATH for + Myoview with basal anterior ischemia -> Ost LAD 80% (diffuse) then 100% after SP2, RI 70% (too small for PCI), Ost-prox Cx 99% & OM1 90%, OstrPDA 80% (small); Occluded SVG-rPDA.  Patent LIMA-dLAD, SVG-OM: culprit ~ p-m LAD pre-LIMA & RI - not good PCI target --> Med Rx  . CKD (chronic kidney disease) stage 3, GFR 30-59 ml/min   . Degenerative disc disease, cervical    Degenerative disc disease, cervical [722.4]  . Diabetes mellitus without complication ([facility])    Type II  . Endometrial cancer [facility]) November 2015   Treated with TAH with pelvic lymphadenectomy followed by radiation and chemotherapy  . GERD (gastroesophageal reflux disease)   . Hearing loss    bilateral - no hearing aids  . Heart murmur   . History of asthma     childhood  . History of blood transfusion   . History of pneumonia    "2-3 times"  . History of unstable angina November/December 2012    T wave inversions in inferolateral leads.  No stress test performed.  . Hyperlipidemia LDL goal <70   . Hypertension, essential, benign   . Neuropathy    hands  . Pneumonia 2018  . Renal cell carcinoma (Dana [facility]) 11/18/2008   T2aNx s/p partial left nephrectomy  . 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 Stroke Dana Jackson)    TIA- no residual   . TIA (transient ischemic attack) 1992 &  2010   Past Surgical History:  Procedure Laterality Date  . ANTERIOR CERVICAL DECOMP/DISCECTOMY FUSION  10/11/2005   multi-level  . AV FISTULA PLACEMENT Left 08/03/2017   Procedure: ARTERIOVENOUS (AV) FISTULA CREATION LEFT ARM;  Surgeon: Dana Posner, MD;  Location: [facility];  Service: Vascular;  Laterality: Left;  . CARDIAC CATHETERIZATION  12/24/10   40% left main, 80% mid LAD, 95% ostial circumflex, 80-90% PDA.  [name] CARDIAC CATHETERIZATION N/A 07/01/2014   Procedure: Left Heart Cath and Cors/Grafts Angiography;  Surgeon: Dana Man, MD;  Location: [facility]:  For Abn Nuc @ [facility]: Ost LAD 80%, mid LAD 100% after S/P 2, 70% RI (no PCI target), ost-prox Cx 99%, OM1 90%. Ost rPDA 80% (~ pre-CABG), occluded SVG-rPDA, patent LIMA-dLAD, SVG OM; potential culprit for abn Nuc scan = pLAD Dz, small RI 70% or PDA -> med Rx  . CAROTID ENDARTERECTOMY Left   . CHOLECYSTECTOMY  ~ 1971  . COLONOSCOPY    .  CORONARY ARTERY BYPASS GRAFT  12/25/2010   Procedure: CORONARY ARTERY BYPASS GRAFTING (CABGX3 - LIMA-LAD, SVG RPL, SVG-Circumflex);  Surgeon: Dana Alberts, MD;  Location: [facility];  Service: Open Heart Surgery;  Laterality: N/A;  . DILATATION & CURETTAGE/HYSTEROSCOPY WITH MYOSURE N/A 11/02/2013   Procedure: DILATATION & CURETTAGE/HYSTEROSCOPY WITH MYOSURE ABLATION;  Surgeon: Dana Katz, MD;  Location: [facility];  Service: Gynecology;  Laterality: N/A;  . DOPPLER ECHOCARDIOGRAPHY  12/08/2010   EF =>55%,MILD CONCENTRIC LEFT VENTRICULAR HYPERTROPHY  . EYE SURGERY Bilateral    Cataract  . IR  DIALY SHUNT INTRO NEEDLE/INTRACATH INITIAL W/IMG LEFT Left 04/24/2019  . IR US GUIDE VASC ACCESS LEFT  04/24/2019  . LEFT HEART CATHETERIZATION WITH CORONARY ANGIOGRAM N/A 12/24/2010   Procedure: LEFT HEART CATHETERIZATION WITH CORONARY ANGIOGRAM;  Surgeon: Dana Man, MD;  Location: [facility];  Service: Cardiovascular;  Laterality: N/A;  . NM MYOCAR SINGLE W/SPECT  07/26/2007   EF 79%, LEFT VENT.FUNCTION NORMAL  . PARTIAL NEPHRECTOMY Left 11/18/2008   left partial nephrectomy for renal cell CA  . PET Myocardial Perfusion Scan  06/13/2014   At [facility]: Moderate size, mild severity completely reversible defect involving the basal anterior, mid anterior and apical anterior segments consistent with ischemia. EF 65% with normal global function.  . TONSILLECTOMY     "in college"  . TOTAL ABDOMINAL HYSTERECTOMY  November 2015    At [facility]: Robotic procedure with pelvic lymphadenectomy  . TRANSTHORACIC ECHOCARDIOGRAM  06/13/2014   At Dana Jackson: EF 60-65%. Grade 1 diastolic dysfunction. Mild MR. Aortic sclerosis. Moderate pulmonary hypertension  . TUBAL LIGATION  ~ 1984  . UPPER GI ENDOSCOPY    . WISDOM TOOTH EXTRACTION        Allergies: Sulfa antibiotics, Erythromycin, Pravastatin, Simvastatin, Codeine, and Gemfibrozil  Medications: Prior to Admission medications   Medication Sig Start Date End Date Taking? Authorizing Provider  Alpha-Lipoic Acid 600 MG CAPS Take 600 mg by mouth daily.   Yes [provider]  aspirin 81 MG tablet Take 81 mg by mouth at bedtime.    Yes [provider]  atorvastatin (LIPITOR) 40 MG tablet Take 40 mg by mouth at bedtime.    Yes [provider]  famotidine (PEPCID) 20 MG tablet Take 40 mg by mouth at bedtime.    Yes [provider]  furosemide (LASIX) 20 MG tablet Take 20 mg by mouth See admin instructions. Takes 1 tablet 2 times daily as needed on Tuesday and Friday.   Yes [provider]    gabapentin (NEURONTIN) 100 MG capsule Take 100 mg by mouth 3 (three) times daily.    Yes [provider]  isosorbide mononitrate (IMDUR) 60 MG 24 hr tablet Take 1 tablet (60 mg total) by mouth daily. 01/26/19 04/26/19 Yes Dana Man, MD  loratadine (CLARITIN) 10 MG tablet Take 10 mg by mouth daily.   Yes [provider]  magnesium oxide (MAG-OX) 400 MG tablet Take 400 mg by mouth daily.   Yes [provider]  metoprolol tartrate (LOPRESSOR) 50 MG tablet Take 1 tablet (50 mg total) by mouth 2 (two) times daily. 02/27/19  Yes Dana Man, MD  nitroGLYCERIN (NITROSTAT) 0.4 MG SL tablet PLACE 1 TABLET (0.4 MG TOTAL) UNDER THE TONGUE EVERY 5 (FIVE) MINUTES AS NEEDED FOR CHEST PAIN. Patient taking differently: Place 0.4 mg under the tongue every 5 (five) minutes as needed for chest pain.  01/30/19  Yes Dana Man, MD  Omega-3 Fatty Acids (FISH OIL) 1200 MG CAPS Take 1,200 mg by mouth in the morning and at bedtime.    Yes [provider]  omeprazole (PRILOSEC) 40 MG capsule Take 40 mg by mouth daily before breakfast.    Yes [provider]  Probiotic Product (ALIGN) 4 MG CAPS Take 1 capsule by mouth daily.   Yes [provider]  pyridOXINE (VITAMIN B-6) 100 MG tablet Take 200 mg by mouth daily.    Yes [provider]     Vital Signs: BP (!) 131/91   Pulse 88   Temp 98.2 F (36.8 C) (Oral)   Resp 17   Ht 5' (1.524 m)   Wt 171 lb 15.3 oz (78 kg)   SpO2 96%   BMI 33.58 kg/m   Physical Exam awake, pleasantly confused.  Daughter at bedside.  Chest clear to auscultation bilaterally.  Heart with regular rate rhythm.  Abdomen soft, positive bowel sounds, nontender.  No significant lower extremity edema.  Left upper arm AV fistula with positive thrill/bruit and ecchymotic  Imaging: CT ANGIO HEAD W OR WO CONTRAST  Result Date: 04/25/2019 CLINICAL DATA:  Aphasia. EXAM: CT ANGIOGRAPHY HEAD AND NECK TECHNIQUE: Multidetector CT  imaging of the head and neck was performed using the standard protocol during bolus administration of intravenous contrast. Multiplanar CT image reconstructions and MIPs were obtained to evaluate the vascular anatomy. Carotid stenosis measurements (when applicable) are obtained utilizing NASCET criteria, using the distal internal carotid diameter as the denominator. CONTRAST:  Dose is currently not available COMPARISON:  04/20/2019 head CT and 04/22/2019 brain MRI FINDINGS: CTA NECK FINDINGS Aortic arch: Status post CABG. Atherosclerosis without acute finding such as dissection. Right carotid system: Significant motion degradation throughout the neck. There is calcified plaque at the bifurcation with no possible estimate of narrowing. Left carotid system: Extensive motion artifact throughout the neck. There is calcified plaque at the bifurcation with some degree of stenosis that is not quantifiable. Vertebral arteries: No proximal subclavian flow limiting stenosis. There is calcified plaque at both subclavian arteries. Diffuse motion artifact affecting the vertebral arteries, which are patent to the dura. Skeleton: Cervical spine degeneration with multilevel ACDF. Other neck: No acute finding. Upper chest: Nodular density in the lingula the level of the hilum, measuring 8 mm-stable from 2011. Review of the MIP images confirms the above findings CTA HEAD FINDINGS Anterior circulation: Significant motion artifact. No evidence of emergent large vessel occlusion. There is atherosclerotic plaque of the carotid siphons with luminal stenosis that is not well assessed. Posterior circulation: Extensive atherosclerotic plaque. Symmetric patent vertebral arteries and diffusely patent basilar. Symmetric flow in the posterior cerebral arteries which are significantly degraded by motion. Venous sinuses: Unremarkable in the arterial phase Anatomic variants: None significant Review of the MIP images confirms the above findings  IMPRESSION: 1. Extensive motion artifact with no evidence of emergent large vessel occlusion. 2. Carotid and vertebral atherosclerosis. Cannot quantify any stenosis due to the degree of artifact. Electronically Signed   By: Monte Fantasia M.D.   On: 04/25/2019 10:34   CT ANGIO NECK W OR WO CONTRAST  Result Date: 04/25/2019 CLINICAL DATA:  Aphasia. EXAM: CT ANGIOGRAPHY HEAD AND NECK TECHNIQUE: Multidetector CT imaging of the head and neck was performed using the standard protocol during bolus administration of intravenous contrast. Multiplanar CT image reconstructions and MIPs were obtained to evaluate the vascular anatomy. Carotid stenosis measurements (when applicable) are obtained utilizing NASCET criteria, using the distal internal carotid diameter as the  denominator. CONTRAST:  Dose is currently not available COMPARISON:  04/20/2019 head CT and 04/22/2019 brain MRI FINDINGS: CTA NECK FINDINGS Aortic arch: Status post CABG. Atherosclerosis without acute finding such as dissection. Right carotid system: Significant motion degradation throughout the neck. There is calcified plaque at the bifurcation with no possible estimate of narrowing. Left carotid system: Extensive motion artifact throughout the neck. There is calcified plaque at the bifurcation with some degree of stenosis that is not quantifiable. Vertebral arteries: No proximal subclavian flow limiting stenosis. There is calcified plaque at both subclavian arteries. Diffuse motion artifact affecting the vertebral arteries, which are patent to the dura. Skeleton: Cervical spine degeneration with multilevel ACDF. Other neck: No acute finding. Upper chest: Nodular density in the lingula the level of the hilum, measuring 8 mm-stable from 2011. Review of the MIP images confirms the above findings CTA HEAD FINDINGS Anterior circulation: Significant motion artifact. No evidence of emergent large vessel occlusion. There is atherosclerotic plaque of the carotid  siphons with luminal stenosis that is not well assessed. Posterior circulation: Extensive atherosclerotic plaque. Symmetric patent vertebral arteries and diffusely patent basilar. Symmetric flow in the posterior cerebral arteries which are significantly degraded by motion. Venous sinuses: Unremarkable in the arterial phase Anatomic variants: None significant Review of the MIP images confirms the above findings IMPRESSION: 1. Extensive motion artifact with no evidence of emergent large vessel occlusion. 2. Carotid and vertebral atherosclerosis. Cannot quantify any stenosis due to the degree of artifact. Electronically Signed   By: Monte Fantasia M.D.   On: 04/25/2019 10:34   MR BRAIN WO CONTRAST  Result Date: 04/25/2019 CLINICAL DATA:  Aphasia. EXAM: MRI HEAD WITHOUT CONTRAST TECHNIQUE: Multiplanar, multiecho pulse sequences of the brain and surrounding structures were obtained without intravenous contrast. COMPARISON:  Three days ago FINDINGS: Brain: Limited sequence MRI performed in the critical setting of this altered patient. Diffusion, gradient, and FLAIR imaging was performed. Diffusion imaging is good quality in 2 planes ; there is no acute infarct. Moderate chronic small vessel ischemic type change in the cerebral white matter and pons, accentuated by motion artifact. There is central predominant cerebral volume loss. Vascular: No T2 weighted imaging to assess flow voids Skull and upper cervical spine: Left frontal bone osteoma. No significant finding Sinuses/Orbits: Negative IMPRESSION: 1. Negative for acute infarct or brain edema. 2. Chronic small vessel ischemia in the white matter and pons. 3. Truncated and motion degraded study due to patient condition. Electronically Signed   By: Monte Fantasia M.D.   On: 04/25/2019 11:08   MR BRAIN WO CONTRAST  Result Date: 04/22/2019 CLINICAL DATA:  74 year old female with recent altered mental status common cephalopathy. EXAM: MRI HEAD WITHOUT CONTRAST  TECHNIQUE: Multiplanar, multiecho pulse sequences of the brain and surrounding structures were obtained without intravenous contrast. COMPARISON:  Head CT 04/20/2019. Brain MRI 09/08/2005. FINDINGS: Brain: No restricted diffusion to suggest acute infarction. No midline shift, mass effect, evidence of mass lesion, ventriculomegaly, extra-axial collection or acute intracranial hemorrhage. Cervicomedullary junction and pituitary are within normal limits. Cerebral volume loss since 2007 appears generalized. No cortical encephalomalacia or chronic cerebral blood products identified. Chronic but increased, patchy and confluent bilateral cerebral white matter T2 and FLAIR hyperintensity. Some deep white matter capsule involvement as before. Comparatively mild T2 heterogeneity in the deep gray matter nuclei. Moderate T2 hyperintensity in the pons has also progressed. Vascular: Major intracranial vascular flow voids are stable since 2007 and within normal limits. Skull and upper cervical spine: Partially visible cervical ACDF with  no adverse features identified. Visualized bone marrow signal is within normal limits. Sinuses/Orbits: Postoperative changes to both globes, otherwise negative orbits. Mild left sphenoid sinus mucosal thickening is chronic, other paranasal sinuses remain well pneumatized. Other: Mastoids are clear. Visible internal auditory structures appear normal. Scalp and face soft tissues appear negative. IMPRESSION: 1. No acute intracranial abnormality. 2. Chronic but progressed signal changes in the cerebral white matter, deep gray nuclei and pons, most commonly due to chronic small vessel disease. Electronically Signed   By: Genevie Ann M.D.   On: 04/22/2019 16:56   IR US Guide Vasc Access Left  Result Date: 04/24/2019 INDICATION: 74 year old with end-stage renal disease. Left brachiocephalic fistula was placed in 2019 and recently started using for hemodialysis. Difficult cannulation of the fistula and  concern for fistula infiltration. Request for fistulagram. EXAM: LEFT UPPER EXTREMITY FISTULOGRAM ULTRASOUND GUIDANCE FOR VASCULAR ACCESS MEDICATIONS: None. ANESTHESIA/SEDATION: None FLUOROSCOPY TIME:  Fluoroscopy Time: 48 seconds, 7 mGy CONTRAST:  40 mL Omnipaque 409 COMPLICATIONS: None immediate. PROCEDURE: Informed consent was obtained for a fistulogram. Maximal Sterile Barrier Technique was utilized including caps, mask, sterile gowns, sterile gloves, sterile drape, hand hygiene and skin antiseptic. A timeout was performed prior to the initiation of the procedure. The left antecubital region and upper arm was prepped and draped in sterile fashion. Ultrasound confirmed a patent cephalic vein and fistula. Ultrasound images were saved for documentation. Skin was anesthetized with 1% lidocaine. Using ultrasound guidance, 21 gauge needle was directed into the cephalic vein near the anastomosis. Puncturing the vein was mildly difficult due to the tortuosity of the vessel. Vein was successfully punctured with ultrasound guidance and micropuncture dilator set was placed. A series of fistulogram were obtained. Multiple reflux images were obtained in different projections in order to evaluate the arterial anastomosis. Catheter was removed with manual compression. FINDINGS: Left brachiocephalic fistula is patent. Small collateral vessels in the mid humeral region draining into the brachial venous system. Central veins are patent. Patient has a right jugular Port-A-Cath with the tip at the SVC and right atrium junction. Arterial anastomosis is widely patent. Approximately 4-5 cm from the arterial anastomosis, there is segment of tortuosity and narrowing in the cephalic vein. In addition, there is evidence for a small dissection flap along the anterior aspect of the cephalic vein near the puncture site. IMPRESSION: 1. Left brachiocephalic fistula is patent. 2. Focal area of tortuosity and narrowing in the cephalic vein  approximately 4-5 cm from the anastomosis. This narrowing appears to be related to the tortuosity rather than intimal hyperplasia. No intervention was performed. 3. Short segment intimal flap or non flow limiting dissection along the anterior aspect of the cephalic vein near the cannulation site. This small dissection may be iatrogenic from the procedure. ACCESS: This access remains amenable to future percutaneous interventions as clinically indicated. Electronically Signed   By: Dana Jackson M.D.   On: 04/24/2019 15:13   EEG adult  Result Date: 04/25/2019 Lora Havens, MD     04/25/2019 12:59 PM Patient Name: Dana Jackson MRN: [id number] Epilepsy Attending: Lora Havens Referring Physician/Provider: Dr. Kathrynn Speed Date: 04/25/2019 Duration: 25.35 minutes Patient history: 74 year old female who was initially admitted for confusion and today was noted to have an episode of right-sided weakness and speech disturbance.  EEG to evaluate for seizures. Level of alertness: Lethargic AEDs during EEG study: None Technical aspects: This EEG study was done with scalp electrodes positioned according to the 10-20 International system of electrode placement. Electrical activity was  acquired at a sampling rate of 500Hz  and reviewed with a high frequency filter of 70Hz  and a low frequency filter of 1Hz . EEG data were recorded continuously and digitally stored. Description: EEG showed continuous generalized low amplitude 3 to 5 Hz theta-delta slowing.  No clear posterior dominant rhythm was seen.  Hyperventilation and photic stimulation were not performed. Of note, study was technically difficult due to significant myogenic and electrode artifact. Abnormality -Continuous slow, generalized IMPRESSION: This technically difficult study suggestive of moderate diffuse encephalopathy, nonspecific to etiology.  No seizures or epileptiform discharges were seen throughout the recording. Priyanka Barbra Sarks   Overnight EEG  with video  Result Date: 04/26/2019 Lora Havens, MD     04/26/2019 11:58 AM Patient Name: Dana Jackson MRN: [id number] Epilepsy Attending: Lora Havens Referring Physician/Provider: Dr. Kathrynn Speed Duration: 04/25/2019 1252 to 04/26/2019 1031 Patient history: 74 year old female who was initially admitted for confusion and today was noted to have an episode of right-sided weakness and speech disturbance.  EEG to evaluate for seizures.  Level of alertness: awake, asleep  AEDs during EEG study: Gabapentin  Technical aspects: This EEG study was done with scalp electrodes positioned according to the 10-20 International system of electrode placement. Electrical activity was acquired at a sampling rate of 500Hz  and reviewed with a high frequency filter of 70Hz  and a low frequency filter of 1Hz . EEG data were recorded continuously and digitally stored.  Description: No clear posterior dominant rhythm was seen. Sleep was characterized by vertex waves, sleep spindles (12-14hz ) maximal frontocentral. EEG showed continuous generalized low amplitude 3 to 5 Hz theta-delta slowing. Hyperventilation and photic stimulation were not performed. Event button was pressed on 04/25/2019 and 04/26/2019 at following times 1413: Husband helping patient move, unclear why button was pressed 1432: Staff helping patient in bed, unclear why button was pressed 1456: Patient off camera, unclear why button was pressed 1830: Staff repositioning patient, unclear why button was pressed 1907: Patient laying with eyes closed in bed, unclear why button was pressed 2031: Staff repositioning patient, unclear what and was pressed 2057 : Patient laying in bed, staff auscultating patient, unclear why button was pressed 0406: Staff cleaning and talking to patient, unclear why button was pressed 0425: Staff asked patient if she is doing okay patient responded, unclear why button was pressed 0800: Sitting in bed with breakfast tray in front of  her, saying something to husband but difficult to hear.  Unclear by button was pressed Concomitant EEG before, during and after all these pushbutton events did not show any EEG changes suggest seizure activity.  Abnormality -Continuous slow, generalized  IMPRESSION: This study suggestive of moderate diffuse encephalopathy, nonspecific to etiology.  No seizures or epileptiform discharges were seen throughout the recording. Event button was pressed multiple times on 4/40/2021 and 04/26/2019 as described above without concomitant EEG change and therefore not epileptic.  Priyanka O Yadav   IR DIALY SHUNT INTRO NEEDLE/INTRACATH INITIAL W/IMG LEFT  Result Date: 04/24/2019 INDICATION: 74 year old with end-stage renal disease. Left brachiocephalic fistula was placed in 2019 and recently started using for hemodialysis. Difficult cannulation of the fistula and concern for fistula infiltration. Request for fistulagram. EXAM: LEFT UPPER EXTREMITY FISTULOGRAM ULTRASOUND GUIDANCE FOR VASCULAR ACCESS MEDICATIONS: None. ANESTHESIA/SEDATION: None FLUOROSCOPY TIME:  Fluoroscopy Time: 48 seconds, 7 mGy CONTRAST:  40 mL Omnipaque 423 COMPLICATIONS: None immediate. PROCEDURE: Informed consent was obtained for a fistulogram. Maximal Sterile Barrier Technique was utilized including caps, mask, sterile gowns, sterile gloves, sterile drape, hand hygiene and  skin antiseptic. A timeout was performed prior to the initiation of the procedure. The left antecubital region and upper arm was prepped and draped in sterile fashion. Ultrasound confirmed a patent cephalic vein and fistula. Ultrasound images were saved for documentation. Skin was anesthetized with 1% lidocaine. Using ultrasound guidance, 21 gauge needle was directed into the cephalic vein near the anastomosis. Puncturing the vein was mildly difficult due to the tortuosity of the vessel. Vein was successfully punctured with ultrasound guidance and micropuncture dilator set was  placed. A series of fistulogram were obtained. Multiple reflux images were obtained in different projections in order to evaluate the arterial anastomosis. Catheter was removed with manual compression. FINDINGS: Left brachiocephalic fistula is patent. Small collateral vessels in the mid humeral region draining into the brachial venous system. Central veins are patent. Patient has a right jugular Port-A-Cath with the tip at the SVC and right atrium junction. Arterial anastomosis is widely patent. Approximately 4-5 cm from the arterial anastomosis, there is segment of tortuosity and narrowing in the cephalic vein. In addition, there is evidence for a small dissection flap along the anterior aspect of the cephalic vein near the puncture site. IMPRESSION: 1. Left brachiocephalic fistula is patent. 2. Focal area of tortuosity and narrowing in the cephalic vein approximately 4-5 cm from the anastomosis. This narrowing appears to be related to the tortuosity rather than intimal hyperplasia. No intervention was performed. 3. Short segment intimal flap or non flow limiting dissection along the anterior aspect of the cephalic vein near the cannulation site. This small dissection may be iatrogenic from the procedure. ACCESS: This access remains amenable to future percutaneous interventions as clinically indicated. Electronically Signed   By: Dana Jackson M.D.   On: 04/24/2019 15:13   CT HEAD CODE STROKE WO CONTRAST  Result Date: 04/25/2019 CLINICAL DATA:  Code stroke.  Aphasia. EXAM: CT HEAD WITHOUT CONTRAST TECHNIQUE: Contiguous axial images were obtained from the base of the skull through the vertex without intravenous contrast. COMPARISON:  Brain MRI 04/22/2019, head CT 04/20/2019 FINDINGS: Brain: There is no evidence of acute intracranial hemorrhage, intracranial mass, midline shift or extra-axial fluid collection.No acute demarcated cortical infarct is demonstrated. Redemonstrated chronic ischemic changes within the  cerebral white matter, deep gray nuclei and pons. Stable, mild generalized parenchymal atrophy. Vascular: No hyperdense vessel.  Atherosclerotic calcifications. Skull: Normal. Negative for fracture or focal lesion. Redemonstrated small left frontal calvarial exostosis. Sinuses/Orbits: Visualized orbits demonstrate no acute abnormality. Mild ethmoid and left sphenoid sinus mucosal thickening. No significant mastoid effusion. These results were communicated to Dr. Willaim Bane 10:20 amon 4/14/2021by text page via the [facility] messaging system. IMPRESSION: No CT evidence of acute intracranial abnormality. Stable appearance of generalized parenchymal atrophy and chronic small vessel ischemic disease. Electronically Signed   By: Kellie Simmering DO   On: 04/25/2019 10:20    Labs:  CBC: Recent Labs    04/20/19 1331 04/21/19 0525 04/24/19 1420 04/25/19 0822  WBC 6.4 5.5 5.8 7.7  HGB 9.9* 9.5* 10.0* 10.0*  HCT 30.7* 28.9* 30.9* 30.3*  PLT 202 179 186 205    COAGS: Recent Labs    03/27/19 1727  INR 1.0  APTT 37*    BMP: Recent Labs    04/21/19 0525 04/24/19 0404 04/25/19 0822 04/26/19 0446  NA 140 138 137 138  K 4.4 4.3 4.1 4.0  CL 106 105 100 102  CO2 25 23 26 23   GLUCOSE 98 122* 188* 107*  BUN 43* 50* 35* 39*  CALCIUM 9.4  9.6 9.5 9.8  CREATININE 4.37* 4.76* 4.03* 4.86*  GFRNONAA 9* 8* 10* 8*  GFRAA 11* 10* 12* 10*    LIVER FUNCTION TESTS: Recent Labs    04/10/19 1146 04/20/19 1331 04/21/19 0525 04/25/19 0822  BILITOT 0.6 0.5 0.6 0.9  AST 27 18 16  40  ALT 41 25 22 41  ALKPHOS 82 90 82 86  PROT 5.6* 5.4* 5.2* 5.4*  ALBUMIN 3.0* 3.0* 2.8* 3.0*    Assessment and Plan: 74 yo female with history of end-stage renal disease, CAD with prior CABG, prior CVA, left RCC, endom ca, GERD,  anemia, hypertension, uremic encephalopathy/dementia.  She has had persistent difficulties with LUE fistula cannulation and request now received for tunneled dialysis catheter placement.   Details/risks of procedure, including but not limited to, internal bleeding, infection, injury to adjacent structures discussed with patient/family with their understanding and consent.  Procedure tentatively scheduled for later today. Last dose SQ heparin at 0600 today. Last ate around 8 am today.   Electronically Signed: D. Rowe Robert, PA-C 04/26/2019, 12:26 PM   I spent a total of 25 minutes at the the patient's bedside AND on the patient's hospital floor or unit, greater than 50% of which was counseling/coordinating care for tunneled hemodialysis catheter placement    Patient ID: Dana Jackson, female   DOB: [birth year], 74 y.o.   MRN: [id number]

## 2019-04-26 NOTE — Progress Notes (Signed)
Renal Navigator aware that Code Stroke had been initiated for patient-new HD patient. Navigator notes patient not in room, but husband sitting in room alone. Navigator introduced self to patient's husband/Steve, a very pleasant gentleman, who is obviously very concerned about his wife. He welcomed Navigator's visit and started talking briefly about the events of the morning. Patient then rolled back in to the room and Navigator did not have an opportunity to step out as there were so many staff accompanying her, as well as Neurologist who came to update husband. Patient's husband was receiving phone calls as he sat there, and it is evident that many people are concerned about the couple, as he would answer, and say. "I'm talking with the doctor now. I'll call you back."  It was then decided that they would do an emergent MRI (patient had just had CT scan completed). While patient was still in the room, she continually asked for her husband.  After she left again, [name] was tearful. He told Navigator, and RN/Jay, that they have been married for 54 years. They have two children, both who live in [place]. Their daughter is a Pharmacist, hospital at [employer].  Though Navigator stated role, and that we will talk later about her OP HD needs, it seemed like [name] had a few questions he wanted to address now. We determined that Navigator will go ahead and request an OP HD seat at [facility], as this is the clinic closest to patient's home as Navigator explained that since patient has now had HD initiated in the hospital, she will need to follow up in a clinic 3 days per week at discharge. She will receive HHD training, if that remains the goal, at the clinic. He states concern that her fistula is not working. Navigator noted concerns, and states that they will speak with Nephrologist and Vascular team regarding what will need to be done regarding her permanent access. Renal Navigator suggested that her existing  access may be able to be worked on, or she may have to get a new one. Patient's husband stated understanding. He stated no further questions or needs of Navigator at this time and was appreciative of the visit and support offered. Renal Navigator will follow up.  Alphonzo Cruise, [place] Renal Navigator [phone]

## 2019-04-26 NOTE — Procedures (Addendum)
Patient Name: Dana Jackson  MRN: [id number]  Epilepsy Attending: Lora Havens  Referring Physician/Provider: Dr. Kathrynn Speed Duration: 04/25/2019 1252 to 04/26/2019 1031  Patient history: 74 year old female who was initially admitted for confusion and today was noted to have an episode of right-sided weakness and speech disturbance.  EEG to evaluate for seizures.  Level of alertness: awake, asleep  AEDs during EEG study: Gabapentin  Technical aspects: This EEG study was done with scalp electrodes positioned according to the 10-20 International system of electrode placement. Electrical activity was acquired at a sampling rate of 500Hz  and reviewed with a high frequency filter of 70Hz  and a low frequency filter of 1Hz . EEG data were recorded continuously and digitally stored.   Description: No clear posterior dominant rhythm was seen. Sleep was characterized by vertex waves, sleep spindles (12-14hz ) maximal frontocentral. EEG showed continuous generalized low amplitude 3 to 5 Hz theta-delta slowing. Hyperventilation and photic stimulation were not performed.  Event button was pressed on 04/25/2019 and 04/26/2019 at following times 1413: Husband helping patient move, unclear why button was pressed 1432: Staff helping patient in bed, unclear why button was pressed 1456: Patient off camera, unclear why button was pressed 1830: Staff repositioning patient, unclear why button was pressed 1907: Patient laying with eyes closed in bed, unclear why button was pressed 2031: Staff repositioning patient, unclear what and was pressed 2057 : Patient laying in bed, staff auscultating patient, unclear why button was pressed 0406: Staff cleaning and talking to patient, unclear why button was pressed 0425: Staff asked patient if she is doing okay patient responded, unclear why button was pressed 0800: Sitting in bed with breakfast tray in front of her, saying something to husband but difficult to  hear.  Unclear by button was pressed  Concomitant EEG before, during and after all these pushbutton events did not show any EEG changes suggest seizure activity.  Abnormality -Continuous slow, generalized  IMPRESSION: This study suggestive of moderate diffuse encephalopathy, nonspecific to etiology.  No seizures or epileptiform discharges were seen throughout the recording.  Event button was pressed multiple times on 4/40/2021 and 04/26/2019 as described above without concomitant EEG change and therefore not epileptic.  Eilyn Polack Barbra Sarks

## 2019-04-26 NOTE — Procedures (Signed)
Interventional Radiology Procedure:   Indications: ESRD and difficulty with left arm fistula.  Needs access for hemodialysis.  Procedure: Tunneled dialysis catheter placement  Findings: Left jugular HD catheter, tip at SVC/RA junction. Existing right chest port.  Complications: None     EBL: less than 10 ml  Plan: HD catheter is ready to use.     Kynsley Whitehouse R. Anselm Pancoast, MD  Pager: [phone]

## 2019-04-26 NOTE — Progress Notes (Signed)
Maintenance completed;reprepped T7, T8, Fz, and C3; no skin breakdown was seen. Added gauze hat and recommended mittens as patient is picking at leads.

## 2019-04-26 NOTE — Progress Notes (Signed)
Spoke with RN about EEG husband is concerned that leads are off. Explained we will be up to maintenance pt

## 2019-04-26 NOTE — Evaluation (Signed)
Physical Therapy Evaluation Patient Details Name: Dana Jackson MRN: [id number] DOB: 1945/05/25 Today's Date: 04/26/2019   History of Present Illness  74 yo female with history of end-stage renal disease, CAD with prior CABG, prior CVA, left RCC, endom ca, GERD,  anemia, hypertension, uremic encephalopathy/dementia.  She has had persistent difficulties with LUE fistula cannulation and request now received for tunneled dialysis catheter placement.   Clinical Impression  Pt admitted with above diagnosis. Pt was able to ambulate with RW with cues for sequencing steps and RW and needs min assist for safety as pt lacks safety awareness.  Pt is confused. Daughter present and states she would rather pt go home with [facility] therapy and husband can assist.   Pt currently with functional limitations due to the deficits listed below (see PT Problem List). Pt will benefit from skilled PT to increase their independence and safety with mobility to allow discharge to the venue listed below.    Follow Up Recommendations Home health PT;Supervision/Assistance - 24 hour, HHOT    Equipment Recommendations  Rolling walker with 5" wheels(issued gait belt)    Recommendations for Other Services       Precautions / Restrictions Precautions Precautions: Fall Restrictions Weight Bearing Restrictions: No      Mobility  Bed Mobility               General bed mobility comments: Pt was in chair on arrival.    Transfers Overall transfer level: Needs assistance Equipment used: Rolling walker (2 wheeled) Transfers: Sit to/from Stand Sit to Stand: Min assist         General transfer comment: Pt was able to stand with cues for hand placement, needed steadying assist once on her feet.   Ambulation/Gait Ambulation/Gait assistance: Min assist Gait Distance (Feet): 65 Feet Assistive device: Rolling walker (2 wheeled) Gait Pattern/deviations: Decreased step length - right;Decreased step length -  left;Decreased stride length;Drifts right/left;Trunk flexed;Scissoring;Narrow base of support   Gait velocity interpretation: <1.31 ft/sec, indicative of household ambulator General Gait Details: Pt was able to ambulate with RW with cues for sequencing steps and RW.  Scissoring gait noted and needing cues for steering.  Pt leaning to right and stayed to the right of the RW even when cued.  Will need assist at home.  husband can provide assist per daughter.   Stairs            Wheelchair Mobility    Modified Rankin (Stroke Patients Only)       Balance Overall balance assessment: Needs assistance Sitting-balance support: No upper extremity supported;Feet supported Sitting balance-Leahy Scale: Fair     Standing balance support: Bilateral upper extremity supported;During functional activity Standing balance-Leahy Scale: Poor Standing balance comment: Pt needed UE support bilaterally for standing statically and dynamically.                              Pertinent Vitals/Pain Pain Assessment: No/denies pain    Home Living Family/patient expects to be discharged to:: Private residence Living Arrangements: Spouse/significant other Available Help at Discharge: Family;Available 24 hours/day(husband with back and neck issues) Type of Home: House Home Access: Stairs to enter Entrance Stairs-Rails: Right;Left;Can reach both Entrance Stairs-Number of Steps: flight Home Layout: Two level;Able to live on main level with bedroom/bathroom Home Equipment: Shower seat;Grab bars - tub/shower;Hand held shower head      Prior Function Level of Independence: Independent         Comments:  Pt does not drive, Did own B/D     Hand Dominance   Dominant Hand: Right    Extremity/Trunk Assessment   Upper Extremity Assessment Upper Extremity Assessment: Defer to OT evaluation    Lower Extremity Assessment Lower Extremity Assessment: Generalized weakness    Cervical /  Trunk Assessment Cervical / Trunk Assessment: Kyphotic  Communication   Communication: No difficulties  Cognition Arousal/Alertness: Awake/alert Behavior During Therapy: Flat affect                                   General Comments: Pt states "MArch" and cannot state year.  Oriented to "[facility]".        General Comments      Exercises     Assessment/Plan    PT Assessment Patient needs continued PT services  PT Problem List Decreased activity tolerance;Decreased balance;Decreased mobility;Decreased knowledge of use of DME;Decreased safety awareness;Decreased knowledge of precautions;Cardiopulmonary status limiting activity       PT Treatment Interventions DME instruction;Gait training;Functional mobility training;Therapeutic activities;Therapeutic exercise;Balance training;Patient/family education    PT Goals (Current goals can be found in the Care Plan section)  Acute Rehab PT Goals Patient Stated Goal: to go home PT Goal Formulation: With patient Time For Goal Achievement: 05/10/19 Potential to Achieve Goals: Good    Frequency Min 3X/week   Barriers to discharge        Co-evaluation               AM-PAC PT "6 Clicks" Mobility  Outcome Measure Help needed turning from your back to your side while in a flat bed without using bedrails?: None Help needed moving from lying on your back to sitting on the side of a flat bed without using bedrails?: A Little Help needed moving to and from a bed to a chair (including a wheelchair)?: A Little Help needed standing up from a chair using your arms (e.g., wheelchair or bedside chair)?: A Little Help needed to walk in hospital room?: A Little Help needed climbing 3-5 steps with a railing? : A Lot 6 Click Score: 18    End of Session Equipment Utilized During Treatment: Gait belt Activity Tolerance: Patient limited by fatigue Patient left: in chair;with call bell/phone within reach;with chair alarm  set;with family/visitor present Nurse Communication: Mobility status PT Visit Diagnosis: Unsteadiness on feet (R26.81)    Time: 5364-6803 PT Time Calculation (min) (ACUTE ONLY): 27 min   Charges:   PT Evaluation $PT Eval Moderate Complexity: 1 Mod PT Treatments $Gait Training: 8-22 mins        Kinze Labo W,PT Acute Rehabilitation Services Pager:  [phone]  Office:  [place] 04/26/2019, 1:53 PM

## 2019-04-27 DIAGNOSIS — R410 Disorientation, unspecified: Secondary | ICD-10-CM | POA: Diagnosis not present

## 2019-04-27 DIAGNOSIS — R4182 Altered mental status, unspecified: Secondary | ICD-10-CM | POA: Diagnosis not present

## 2019-04-27 LAB — HEMOGLOBIN A1C
Hgb A1c MFr Bld: 5.6 % (ref 4.8–5.6)
Mean Plasma Glucose: 114.02 mg/dL

## 2019-04-27 LAB — RENAL FUNCTION PANEL
Albumin: 2.9 g/dL — ABNORMAL LOW (ref 3.5–5.0)
Anion gap: 13 (ref 5–15)
BUN: 48 mg/dL — ABNORMAL HIGH (ref 8–23)
CO2: 25 mmol/L (ref 22–32)
Calcium: 9.5 mg/dL (ref 8.9–10.3)
Chloride: 100 mmol/L (ref 98–111)
Creatinine, Ser: 5.65 mg/dL — ABNORMAL HIGH (ref 0.44–1.00)
GFR calc Af Amer: 8 mL/min — ABNORMAL LOW (ref >60)
GFR calc non Af Amer: 7 mL/min — ABNORMAL LOW (ref >60)
Glucose, Bld: 107 mg/dL — ABNORMAL HIGH (ref 70–99)
Phosphorus: 5 mg/dL — ABNORMAL HIGH (ref 2.5–4.6)
Potassium: 4.2 mmol/L (ref 3.5–5.1)
Sodium: 138 mmol/L (ref 135–145)

## 2019-04-27 LAB — LIPID PANEL
Cholesterol: 177 mg/dL (ref 0–200)
HDL: 33 mg/dL — ABNORMAL LOW (ref >40)
LDL Cholesterol: 98 mg/dL (ref 0–99)
Total CHOL/HDL Ratio: 5.4 RATIO
Triglycerides: 231 mg/dL — ABNORMAL HIGH (ref <150)
VLDL: 46 mg/dL — ABNORMAL HIGH (ref 0–40)

## 2019-04-27 LAB — GLUCOSE, CAPILLARY
Glucose-Capillary: 113 mg/dL — ABNORMAL HIGH (ref 70–99)
Glucose-Capillary: 137 mg/dL — ABNORMAL HIGH (ref 70–99)
Glucose-Capillary: 168 mg/dL — ABNORMAL HIGH (ref 70–99)
Glucose-Capillary: 125 mg/dL — ABNORMAL HIGH (ref 70–99)

## 2019-04-27 MED ORDER — MELATONIN 3 MG PO TABS
3.0000 mg | ORAL_TABLET | Freq: Every day | ORAL | Status: DC
  Administered 2019-04-27: 3 mg via ORAL
  Filled 2019-04-27: qty 1

## 2019-04-27 MED ORDER — ATORVASTATIN CALCIUM 80 MG PO TABS
80.0000 mg | ORAL_TABLET | Freq: Every day | ORAL | Status: DC
  Administered 2019-04-27 – 2019-04-28 (×2): 80 mg via ORAL
  Filled 2019-04-27 (×2): qty 1

## 2019-04-27 MED ORDER — HALOPERIDOL LACTATE 5 MG/ML IJ SOLN
5.0000 mg | Freq: Four times a day (QID) | INTRAMUSCULAR | Status: DC | PRN
  Administered 2019-04-27: 19:00:00 5 mg via INTRAMUSCULAR
  Filled 2019-04-27: qty 1

## 2019-04-27 MED ORDER — HEPARIN SODIUM (PORCINE) 1000 UNIT/ML IJ SOLN
INTRAMUSCULAR | Status: AC
  Administered 2019-04-27: 17:00:00 4000 [IU]
  Filled 2019-04-27: qty 5

## 2019-04-27 MED ORDER — SODIUM CHLORIDE 0.9% FLUSH
10.0000 mL | INTRAVENOUS | Status: DC | PRN
  Administered 2019-04-29: 14:00:00 10 mL

## 2019-04-27 MED ORDER — QUETIAPINE FUMARATE 25 MG PO TABS
12.5000 mg | ORAL_TABLET | Freq: Every day | ORAL | Status: DC
  Administered 2019-04-27: 21:00:00 12.5 mg via ORAL
  Filled 2019-04-27: qty 1

## 2019-04-27 MED ORDER — SODIUM CHLORIDE 0.9% FLUSH
10.0000 mL | Freq: Two times a day (BID) | INTRAVENOUS | Status: DC
  Administered 2019-04-27 – 2019-04-28 (×3): 10 mL

## 2019-04-27 MED ORDER — HALOPERIDOL LACTATE 5 MG/ML IJ SOLN
5.0000 mg | Freq: Four times a day (QID) | INTRAMUSCULAR | Status: DC | PRN
  Filled 2019-04-27: qty 1

## 2019-04-27 NOTE — Procedures (Signed)
I was present at this dialysis session. I have reviewed the session itself and made appropriate changes.   Pt has attempted several tiems to get out of bed; is disoriented to location year, but recognizes me. Not easily redirected.   Will need soft restraints to safely complete HD  Using [facility], working well, UF 1.5L.    Neuro notes reviewed  This is clearly a change from her baseline over the past year.    Filed Weights   04/25/19 0500 04/26/19 0423 04/27/19 0539  Weight: 77.2 kg 78 kg 63.5 kg    Recent Labs  Lab 04/27/19 0704  NA 138  K 4.2  CL 100  CO2 25  GLUCOSE 107*  BUN 48*  CREATININE 5.65*  CALCIUM 9.5  PHOS 5.0*    Recent Labs  Lab 04/21/19 0525 04/24/19 1420 04/25/19 0822  WBC 5.5 5.8 7.7  HGB 9.5* 10.0* 10.0*  HCT 28.9* 30.9* 30.3*  MCV 92.0 92.0 91.0  PLT 179 186 205    Scheduled Meds: . acidophilus  1 capsule Oral Daily  . aspirin EC  81 mg Oral QHS  . atorvastatin  80 mg Oral QHS  . calcitRIOL  0.25 mcg Oral Daily  . Chlorhexidine Gluconate Cloth  6 each Topical Q0600  . cyanocobalamin  1,000 mcg Intramuscular Daily  . darbepoetin (ARANESP) injection - DIALYSIS  60 mcg Intravenous Q Sat-HD  . famotidine  20 mg Oral QHS  . furosemide  20 mg Oral Once per day on Tue Fri  . gabapentin  100 mg Oral TID  . heparin  5,000 Units Subcutaneous Q8H  . insulin aspart  0-5 Units Subcutaneous QHS  . insulin aspart  0-6 Units Subcutaneous TID WC  . isosorbide mononitrate  60 mg Oral Daily  . loratadine  10 mg Oral Daily  . metoprolol tartrate  50 mg Oral BID  . pantoprazole  40 mg Oral Daily   Continuous Infusions: . ferric gluconate (FERRLECIT/NULECIT) IV 125 mg (04/23/19 1502)   PRN Meds:.acetaminophen **OR** acetaminophen, calcium carbonate (dosed in mg elemental calcium), camphor-menthol **AND** hydrOXYzine, docusate sodium, feeding supplement (NEPRO CARB STEADY), labetalol, nitroGLYCERIN, ondansetron **OR** ondansetron (ZOFRAN) IV, sorbitol, zolpidem    Pearson Grippe  MD 04/27/2019, 2:23 PM

## 2019-04-27 NOTE — Progress Notes (Addendum)
PROGRESS NOTE    Dana Jackson  DQQ:[id number] DOB: Dec 27, 1945 DOA: 04/20/2019 PCP: Heywood Bene, PA-C    Brief Narrative:   Dana Jackson is a 74 y.o. female  with medical history significant ofchronic kidney disease stage V not yet on hemodialysis but dialysis catheter in place, hypertension, hyperlipidemia, previous CVA, renal cell carcinoma, diabetes, coronary artery disease status post coronary artery bypass graft, status post left nephrectomy who presented to the ER with altered mental status. Husband is accompanying patient. She has apparently been progressively getting confused but mainly in the last 3 days. No obvious source of her confusion. HD was initiated in the hospital   Assessment & Plan:   Principal Problem:   Delirium Active Problems:   Renal cell carcinoma (HCC)   Essential hypertension   S/P CABG x 3   Anemia   Hyperlipidemia LDL goal <70   Obesity (BMI 30-39.9)   Type 2 diabetes mellitus with renal manifestations, controlled (HCC)   Hyperkalemia   CAD (coronary artery disease) of artery bypass graft   Acute encephalopathy   acute metabolic encephalopathy 2/2 TIA vs acute delirium vs seizure vs amyloid spell in the setting of dementia Notified by nursing this morning regarding each, right facial droop and numbness to right arm with associated drift noted to right arm/leg.  Rapid response/code stroke was initiated.  CT head 04/25/2019 with no evidence of acute intracranial normality, stable appearance of generalized parenchymal atrophy and chronic small vessel ischemic disease.  CT angio head/neck artifact, no evidence of emergence large vessel occlusion, carotid/vertebral atherosclerosis but cannot quantify stenosis due to degree of artifact.  MR brain negative for acute infarct, no brain edema; but notable for chronic small vessel ischemia.  EEG with moderate diffuse encephalopathy, nonspecific with no seizures or epileptiform discharges  appreciated. --Neurology following, appreciate assistance --Holding off on antiepileptic therapy at this time, any further spells would consider initiation --No further neurodiagnostic testing at this time --We will start Seroquel 12.5 mg p.o. nightly  B12 deficiency B12 lower end of normal.  Neurology recommends parenteral repletion. --Continue B12 IM injections daily x7 doses, then weekly x4 doses then monthly following.  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  Fistulogram noted that fistula widely patent with  Focal tortuous and narrowed segment just beyond the artery.  --Left IJ tunnel catheter placed by IR on 04/26/2019 --Nephrology plans HD today --Still awaiting outpatient HD bed assignment; referred to the [facility] clinic  Type 2 diabetes mellitus: Hemoglobin A1c 5.7 on 03/27/2019, well controlled.  Diet controlled at home. --Continue insulin sliding scale while inpatient  Essential hypertension:  BP 128/57 this morning, controlled. --Metoprolol tartrate 50 mg p.o. twice daily --Continue aspirin and statin --Continue to monitor blood pressure closely  Hyperkalemia: Patient treated in the ER and improved with HD.  Potassium 4.2 today. --Continue HD per nephrology  Obesity Body mass index is 31.99 kg/m.  HLD:  Total cholesterol 177, triglycerides 231, HDL 33, LDL 98. --Increase atorvastatin to 80 mg p.o. daily   DVT prophylaxis: Heparin Code Status: Full code Family Communication: Discussed with patient's husband who is present at bedside  Disposition: Status is: Inpatient  Remains inpatient appropriate because:Altered mental status, Ongoing diagnostic testing needed not appropriate for outpatient work up, Unsafe d/c plan, Inpatient level of care appropriate due to severity of illness and Awaiting outpatient HD  bed   Dispo: The  patient is from: Home              Anticipated d/c is to: Home              Anticipated d/c date is: 3 days              Patient currently is not medically stable to d/c.   Consultants:   Nephrology  Neurology  Procedures:  Left arm fistulogram 04/24/2019 Left brachiocephalic fistula is patent.  Focal tortuous and narrowed segment just beyond the artery.  Arterial anastomosis is widely patent.  Small dissection flap (not flow limiting) near puncture site. Central veins patent.   EEG 04/25/2019: IMPRESSION: This study suggestive of moderate diffuse encephalopathy, nonspecific to etiology.No seizures or epileptiform discharges were seen throughout the recording.  Event button was pressed multiple times on 4/40/2021 and 04/26/2019 as described above without concomitant EEG change and therefore not epileptic.  Antimicrobials:   None   Subjective:  Patient seen and examined at bedside this morning, husband present.  Patient with difficult night overnight, trying to get out of bed on multiple occasions even with husband present.  Continues with mild confusion, does not know place or situation, but is oriented to time/person.  Tunneled HD catheter placed by IR yesterday.  Nephrology plans HD today.  Patient without any other specific complaints or concerns at this time, although pleasantly confused.  Denies headache, no fever/chills/night sweats, no chest pain, no palpitations, no shortness of breath, no abdominal pain.  No acute events overnight per nursing staff.  Objective: Vitals:   04/27/19 0539 04/27/19 0930 04/27/19 1000 04/27/19 1158  BP: (!) 154/69 (!) 154/57 (!) 148/68 (!) 128/57  Pulse: 76 77 77 71  Resp: (!) 22 (!) 22 16 18   Temp: 97.6 F (36.4 C)   98.2 F (36.8 C)  TempSrc: Axillary     SpO2: 96% 96% 98% 97%  Weight: 63.5 kg     Height:        Intake/Output Summary (Last 24 hours) at 04/27/2019 1525 Last data filed at 04/27/2019 0830 Gross per  24 hour  Intake 320 ml  Output --  Net 320 ml   Filed Weights   04/25/19 0500 04/26/19 0423 04/27/19 0539  Weight: 77.2 kg 78 kg 63.5 kg    Examination:  General exam: Appears calm and comfortable, pleasantly confused Respiratory system: Clear to auscultation. Respiratory effort normal.  Oxygenating well on room air Cardiovascular system: S1 & S2 heard, RRR. No JVD, murmurs, rubs, gallops or clicks. No pedal edema. Gastrointestinal system: Abdomen is nondistended, soft and nontender. No organomegaly or masses felt. Normal bowel sounds heard. Central nervous system: Alert, oriented to time and person, but not place and situation Extremities: Symmetric 5 x 5 power. Skin: No rashes, lesions or ulcers Psychiatry: Judgement and insight appear poor. Mood & affect appropriate.     Data Reviewed: I have personally reviewed following labs and imaging studies  CBC: Recent Labs  Lab 04/21/19 0525 04/24/19 1420 04/25/19 0822  WBC 5.5 5.8 7.7  HGB 9.5* 10.0* 10.0*  HCT 28.9* 30.9* 30.3*  MCV 92.0 92.0 91.0  PLT 179 186 732   Basic Metabolic Panel: Recent Labs  Lab 04/21/19 0525 04/24/19 0404 04/25/19 0822 04/26/19 0446 04/27/19 0704  NA 140 138 137 138 138  K 4.4 4.3 4.1 4.0 4.2  CL 106 105 100 102 100  CO2 25 23 26 23 25   GLUCOSE 98 122* 188* 107* 107*  BUN 43* 50* 35*  39* 48*  CREATININE 4.37* 4.76* 4.03* 4.86* 5.65*  CALCIUM 9.4 9.6 9.5 9.8 9.5  MG  --   --   --  2.1  --   PHOS 4.4  --   --  4.8* 5.0*   GFR: Estimated Creatinine Clearance: 7.3 mL/min (A) (by C-G formula based on SCr of 5.65 mg/dL (H)). Liver Function Tests: Recent Labs  Lab 04/21/19 0525 04/25/19 0822 04/27/19 0704  AST 16 40  --   ALT 22 41  --   ALKPHOS 82 86  --   BILITOT 0.6 0.9  --   PROT 5.2* 5.4*  --   ALBUMIN 2.8* 3.0* 2.9*   No results for input(s): LIPASE, AMYLASE in the last 168 hours. Recent Labs  Lab 04/22/19 0603  AMMONIA 21   Coagulation Profile: No results for  input(s): INR, PROTIME in the last 168 hours. Cardiac Enzymes: No results for input(s): CKTOTAL, CKMB, CKMBINDEX, TROPONINI in the last 168 hours. BNP (last 3 results) No results for input(s): PROBNP in the last 8760 hours. HbA1C: Recent Labs    04/27/19 0704  HGBA1C 5.6   CBG: Recent Labs  Lab 04/26/19 1140 04/26/19 1639 04/26/19 2121 04/27/19 0726 04/27/19 1155  GLUCAP 139* 125* 144* 113* 137*   Lipid Profile: Recent Labs    04/27/19 0704  CHOL 177  HDL 33*  LDLCALC 98  TRIG 231*  CHOLHDL 5.4   Thyroid Function Tests: Recent Labs    04/26/19 0446  TSH 2.028   Anemia Panel: No results for input(s): VITAMINB12, FOLATE, FERRITIN, TIBC, IRON, RETICCTPCT in the last 72 hours. Sepsis Labs: No results for input(s): PROCALCITON, LATICACIDVEN in the last 168 hours.  Recent Results (from the past 240 hour(s))  SARS CORONAVIRUS 2 (TAT 6-24 HRS) Nasopharyngeal Nasopharyngeal Swab     Status: None   Collection Time: 04/20/19 10:11 PM   Specimen: Nasopharyngeal Swab  Result Value Ref Range Status   SARS Coronavirus 2 NEGATIVE NEGATIVE Final    Comment: (NOTE) SARS-CoV-2 target nucleic acids are NOT DETECTED. The SARS-CoV-2 RNA is generally detectable in upper and lower respiratory specimens during the acute phase of infection. Negative results do not preclude SARS-CoV-2 infection, do not rule out co-infections with other pathogens, and should not be used as the sole basis for treatment or other patient management decisions. Negative results must be combined with clinical observations, patient history, and epidemiological information. The expected result is Negative. Fact Sheet for Patients: SugarRoll.be Fact Sheet for Healthcare Providers: https://www.woods-mathews.com/ This test is not yet approved or cleared by the [place] FDA and  has been authorized for detection and/or diagnosis of SARS-CoV-2 by FDA under an  Emergency Use Authorization (EUA). This EUA will remain  in effect (meaning this test can be used) for the duration of the COVID-19 declaration under Section 56 4(b)(1) of the Act, 21 U.S.C. section 360bbb-3(b)(1), unless the authorization is terminated or revoked sooner. Performed at [facility], [place] [address] [place], [place] [address]          Radiology Studies: IR Fluoro Guide CV Line Left  Result Date: 04/26/2019 INDICATION: 74 year old with end-stage renal disease. Patient has a left arm fistula which is difficult to cannulate. Request for tunneled dialysis catheter placement. Patient has a right chest Port-A-Cath. EXAM: FLUOROSCOPIC AND ULTRASOUND GUIDED PLACEMENT OF A TUNNELED DIALYSIS CATHETER Physician: Stephan Minister. Anselm Pancoast, MD MEDICATIONS: Ancef 2 g; The antibiotic was administered within an appropriate time interval prior to skin puncture. ANESTHESIA/SEDATION: Versed 1.0  mg IV; Fentanyl 75 mcg IV; Moderate Sedation Time:  24 minutes The patient was continuously monitored during the procedure by the interventional radiology nurse under my direct supervision. FLUOROSCOPY TIME:  Fluoroscopy Time: 30 seconds, 1 mGy COMPLICATIONS: None immediate. PROCEDURE: Informed consent was obtained for placement of a tunneled dialysis catheter. The patient was placed supine on the interventional table. Ultrasound confirmed a patent left internal jugular vein. Ultrasound images were obtained for documentation. The left neck and chest was prepped and draped in a sterile fashion. The left neck was anesthetized with 1% lidocaine. Maximal barrier sterile technique was utilized including caps, mask, sterile gowns, sterile gloves, sterile drape, hand hygiene and skin antiseptic. A small incision was made with #11 blade scalpel. A 21 gauge needle directed into the left internal jugular vein with ultrasound guidance. A micropuncture dilator set was placed. A 23 cm tip to cuff Palindrome catheter was selected.  The skin below the left clavicle was anesthetized and a small incision was made with an #11 blade scalpel. A subcutaneous tunnel was formed to the vein dermatotomy site. The catheter was brought through the tunnel. The vein dermatotomy site was dilated to accommodate a peel-away sheath. The catheter was placed through the peel-away sheath and directed into the central venous structures. The tip of the catheter was placed at the SVC and right atrium junction with fluoroscopy. Fluoroscopic images were obtained for documentation. Both lumens were found to aspirate and flush well. The proper amount of heparin was flushed in both lumens. The vein dermatotomy site was closed using a single layer of absorbable suture and Dermabond. Gel-Foam placed in subcutaneous tract. The catheter was secured to the skin using Prolene suture. IMPRESSION: Successful placement of a left jugular tunneled dialysis catheter using ultrasound and fluoroscopic guidance. Electronically Signed   By: Markus Daft M.D.   On: 04/26/2019 16:51   IR US Guide Vasc Access Left  Result Date: 04/26/2019 INDICATION: 74 year old with end-stage renal disease. Patient has a left arm fistula which is difficult to cannulate. Request for tunneled dialysis catheter placement. Patient has a right chest Port-A-Cath. EXAM: FLUOROSCOPIC AND ULTRASOUND GUIDED PLACEMENT OF A TUNNELED DIALYSIS CATHETER Physician: Stephan Minister. Anselm Pancoast, MD MEDICATIONS: Ancef 2 g; The antibiotic was administered within an appropriate time interval prior to skin puncture. ANESTHESIA/SEDATION: Versed 1.0 mg IV; Fentanyl 75 mcg IV; Moderate Sedation Time:  24 minutes The patient was continuously monitored during the procedure by the interventional radiology nurse under my direct supervision. FLUOROSCOPY TIME:  Fluoroscopy Time: 30 seconds, 1 mGy COMPLICATIONS: None immediate. PROCEDURE: Informed consent was obtained for placement of a tunneled dialysis catheter. The patient was placed supine on the  interventional table. Ultrasound confirmed a patent left internal jugular vein. Ultrasound images were obtained for documentation. The left neck and chest was prepped and draped in a sterile fashion. The left neck was anesthetized with 1% lidocaine. Maximal barrier sterile technique was utilized including caps, mask, sterile gowns, sterile gloves, sterile drape, hand hygiene and skin antiseptic. A small incision was made with #11 blade scalpel. A 21 gauge needle directed into the left internal jugular vein with ultrasound guidance. A micropuncture dilator set was placed. A 23 cm tip to cuff Palindrome catheter was selected. The skin below the left clavicle was anesthetized and a small incision was made with an #11 blade scalpel. A subcutaneous tunnel was formed to the vein dermatotomy site. The catheter was brought through the tunnel. The vein dermatotomy site was dilated to accommodate a peel-away sheath.  The catheter was placed through the peel-away sheath and directed into the central venous structures. The tip of the catheter was placed at the SVC and right atrium junction with fluoroscopy. Fluoroscopic images were obtained for documentation. Both lumens were found to aspirate and flush well. The proper amount of heparin was flushed in both lumens. The vein dermatotomy site was closed using a single layer of absorbable suture and Dermabond. Gel-Foam placed in subcutaneous tract. The catheter was secured to the skin using Prolene suture. IMPRESSION: Successful placement of a left jugular tunneled dialysis catheter using ultrasound and fluoroscopic guidance. Electronically Signed   By: Markus Daft M.D.   On: 04/26/2019 16:51   Overnight EEG with video  Result Date: 04/26/2019 Lora Havens, MD     04/26/2019 11:58 AM Patient Name: Danali Marinos MRN: [id number] Epilepsy Attending: Lora Havens Referring Physician/Provider: Dr. Kathrynn Speed Duration: 04/25/2019 1252 to 04/26/2019 1031 Patient history:  74 year old female who was initially admitted for confusion and today was noted to have an episode of right-sided weakness and speech disturbance.  EEG to evaluate for seizures.  Level of alertness: awake, asleep  AEDs during EEG study: Gabapentin  Technical aspects: This EEG study was done with scalp electrodes positioned according to the 10-20 International system of electrode placement. Electrical activity was acquired at a sampling rate of 500Hz  and reviewed with a high frequency filter of 70Hz  and a low frequency filter of 1Hz . EEG data were recorded continuously and digitally stored.  Description: No clear posterior dominant rhythm was seen. Sleep was characterized by vertex waves, sleep spindles (12-14hz ) maximal frontocentral. EEG showed continuous generalized low amplitude 3 to 5 Hz theta-delta slowing. Hyperventilation and photic stimulation were not performed. Event button was pressed on 04/25/2019 and 04/26/2019 at following times 1413: Husband helping patient move, unclear why button was pressed 1432: Staff helping patient in bed, unclear why button was pressed 1456: Patient off camera, unclear why button was pressed 1830: Staff repositioning patient, unclear why button was pressed 1907: Patient laying with eyes closed in bed, unclear why button was pressed 2031: Staff repositioning patient, unclear what and was pressed 2057 : Patient laying in bed, staff auscultating patient, unclear why button was pressed 0406: Staff cleaning and talking to patient, unclear why button was pressed 0425: Staff asked patient if she is doing okay patient responded, unclear why button was pressed 0800: Sitting in bed with breakfast tray in front of her, saying something to husband but difficult to hear.  Unclear by button was pressed Concomitant EEG before, during and after all these pushbutton events did not show any EEG changes suggest seizure activity.  Abnormality -Continuous slow, generalized  IMPRESSION: This  study suggestive of moderate diffuse encephalopathy, nonspecific to etiology.  No seizures or epileptiform discharges were seen throughout the recording. Event button was pressed multiple times on 4/40/2021 and 04/26/2019 as described above without concomitant EEG change and therefore not epileptic.  Priyanka Barbra Sarks        Scheduled Meds: . acidophilus  1 capsule Oral Daily  . aspirin EC  81 mg Oral QHS  . atorvastatin  80 mg Oral QHS  . calcitRIOL  0.25 mcg Oral Daily  . Chlorhexidine Gluconate Cloth  6 each Topical Q0600  . cyanocobalamin  1,000 mcg Intramuscular Daily  . darbepoetin (ARANESP) injection - DIALYSIS  60 mcg Intravenous Q Sat-HD  . famotidine  20 mg Oral QHS  . furosemide  20 mg Oral Once per day on Tue Fri  .  gabapentin  100 mg Oral TID  . heparin  5,000 Units Subcutaneous Q8H  . insulin aspart  0-5 Units Subcutaneous QHS  . insulin aspart  0-6 Units Subcutaneous TID WC  . isosorbide mononitrate  60 mg Oral Daily  . loratadine  10 mg Oral Daily  . metoprolol tartrate  50 mg Oral BID  . pantoprazole  40 mg Oral Daily   Continuous Infusions: . ferric gluconate (FERRLECIT/NULECIT) IV 125 mg (04/23/19 1502)     LOS: 7 days    Time spent: 36 minutes spent on chart review, discussion with nursing staff, consultants, updating family and interview/physical exam; more than 50% of that time was spent in counseling and/or coordination of care.    Floris Neuhaus J [place] DO [facility] Available via Epic secure chat 7am-7pm After these hours, please refer to coverage provider listed on [email or web address] 04/27/2019, 3:25 PM

## 2019-04-27 NOTE — Progress Notes (Addendum)
Subjective: Mild episode of confusion overnight, but not aphasia likely of a day.  She is just very active.  Exam: Vitals:   04/26/19 2348 04/27/19 0539  BP:  (!) 154/69  Pulse: 68 76  Resp: 15 (!) 22  Temp: (!) 97.5 F (36.4 C) 97.6 F (36.4 C)  SpO2: 94% 96%   Gen: In bed, NAD Resp: non-labored breathing, no acute distress Abd: soft, nt  Neuro: MS: Awake, alert, gives month is March and year  Speech is fluent. CN: Pupils equal and reactive, visual fields full Motor: 5/5 throughout Sensory: Intact light touch Cerebellar: Normal finger-nose-finger  Pertinent Labs: LDL 98  Impression: 74 year old female with episode of aphasia of unclear etiology yesterday morning.  Possibilities include seizure, TIA, amyloid spell.  Without any clear history of episodic events prior to this, I would not start antiepileptic therapy.  The duration is very unusual for TIA given the negative MRI and CTA shortly after the onset of the event.  This would also be unusual for post ictal phenomenon as well, over.  At this point, given the possibility of TIA, I do think it be reasonable to increase her statin which would be the only adjustment if it was TIA.  Her B12 is very low normal and given her history of mental impairment, I do think that parenteral repletion is indicated.  I started this yesterday.  Recommendations: 1) would consider increasing her statin dose given her LDL of 98. 2) IM B12, daily shots while in house up to 7 doses, then weekly shots for 4 doses, then monthly shots after that. 3) if she has any further spells of concern, would consider antiepileptic therapy. 4) could consider nightly Seroquel  .  5) no further neurodiagnostic testing at this time, if she has any other spells, please allergy.  I discussed this with the patient and her husband.  Roland Rack, MD [facility] [phone]  If 7pm- 7am, please page neurology on call as listed in [place].

## 2019-04-28 DIAGNOSIS — R4182 Altered mental status, unspecified: Secondary | ICD-10-CM | POA: Diagnosis not present

## 2019-04-28 LAB — CBC
HCT: 30.3 % — ABNORMAL LOW (ref 36.0–46.0)
Hemoglobin: 10 g/dL — ABNORMAL LOW (ref 12.0–15.0)
MCH: 30.5 pg (ref 26.0–34.0)
MCHC: 33 g/dL (ref 30.0–36.0)
MCV: 92.4 fL (ref 80.0–100.0)
Platelets: 190 10*3/uL (ref 150–400)
RBC: 3.28 MIL/uL — ABNORMAL LOW (ref 3.87–5.11)
RDW: 13.8 % (ref 11.5–15.5)
WBC: 8.1 10*3/uL (ref 4.0–10.5)
nRBC: 0 % (ref 0.0–0.2)

## 2019-04-28 LAB — GLUCOSE, CAPILLARY
Glucose-Capillary: 130 mg/dL — ABNORMAL HIGH (ref 70–99)
Glucose-Capillary: 194 mg/dL — ABNORMAL HIGH (ref 70–99)
Glucose-Capillary: 96 mg/dL (ref 70–99)
Glucose-Capillary: 159 mg/dL — ABNORMAL HIGH (ref 70–99)

## 2019-04-28 LAB — MAGNESIUM: Magnesium: 1.9 mg/dL (ref 1.7–2.4)

## 2019-04-28 LAB — RENAL FUNCTION PANEL
Albumin: 3 g/dL — ABNORMAL LOW (ref 3.5–5.0)
Anion gap: 11 (ref 5–15)
BUN: 18 mg/dL (ref 8–23)
CO2: 26 mmol/L (ref 22–32)
Calcium: 9 mg/dL (ref 8.9–10.3)
Chloride: 99 mmol/L (ref 98–111)
Creatinine, Ser: 3.94 mg/dL — ABNORMAL HIGH (ref 0.44–1.00)
GFR calc Af Amer: 12 mL/min — ABNORMAL LOW (ref >60)
GFR calc non Af Amer: 11 mL/min — ABNORMAL LOW (ref >60)
Glucose, Bld: 100 mg/dL — ABNORMAL HIGH (ref 70–99)
Phosphorus: 4.8 mg/dL — ABNORMAL HIGH (ref 2.5–4.6)
Potassium: 3.4 mmol/L — ABNORMAL LOW (ref 3.5–5.1)
Sodium: 136 mmol/L (ref 135–145)

## 2019-04-28 MED ORDER — MELATONIN 3 MG PO TABS
3.0000 mg | ORAL_TABLET | Freq: Every day | ORAL | Status: DC
  Administered 2019-04-28: 21:00:00 3 mg via ORAL
  Filled 2019-04-28: qty 1

## 2019-04-28 MED ORDER — QUETIAPINE FUMARATE 25 MG PO TABS
12.5000 mg | ORAL_TABLET | Freq: Every day | ORAL | Status: DC
  Administered 2019-04-28: 21:00:00 12.5 mg via ORAL
  Filled 2019-04-28: qty 1

## 2019-04-28 MED ORDER — DARBEPOETIN ALFA 60 MCG/0.3ML IJ SOSY: 60.0000 ug | PREFILLED_SYRINGE | INTRAMUSCULAR | Status: DC

## 2019-04-28 NOTE — Progress Notes (Signed)
Admit: 04/20/2019 LOS: 8  24F new  ESRD, AMS/Delirium  Subjective:  [name] More awake, more herself this AM . Husband at bedside . HD yesterday, 1.5L UF; this was her 3rd Tx . Has outpt HD chair: [place], [facility], 11:55am; pt/husband notified   04/16 0701 - 04/17 0700 In: 130 [P.O.:120; I.V.:10] Out: 1500   Filed Weights   04/27/19 1300 04/27/19 1650 04/28/19 0456  Weight: 76.3 kg 74.8 kg 62.2 kg    Scheduled Meds: . acidophilus  1 capsule Oral Daily  . aspirin EC  81 mg Oral QHS  . atorvastatin  80 mg Oral QHS  . calcitRIOL  0.25 mcg Oral Daily  . Chlorhexidine Gluconate Cloth  6 each Topical Q0600  . cyanocobalamin  1,000 mcg Intramuscular Daily  . darbepoetin (ARANESP) injection - DIALYSIS  60 mcg Intravenous Q Sat-HD  . famotidine  20 mg Oral QHS  . furosemide  20 mg Oral Once per day on Tue Fri  . gabapentin  100 mg Oral TID  . heparin  5,000 Units Subcutaneous Q8H  . insulin aspart  0-5 Units Subcutaneous QHS  . insulin aspart  0-6 Units Subcutaneous TID WC  . isosorbide mononitrate  60 mg Oral Daily  . loratadine  10 mg Oral Daily  . melatonin  3 mg Oral QHS  . metoprolol tartrate  50 mg Oral BID  . pantoprazole  40 mg Oral Daily  . QUEtiapine  12.5 mg Oral QHS  . sodium chloride flush  10-40 mL Intracatheter Q12H   Continuous Infusions: . ferric gluconate (FERRLECIT/NULECIT) IV 125 mg (04/27/19 1701)   PRN Meds:.acetaminophen **OR** acetaminophen, calcium carbonate (dosed in mg elemental calcium), camphor-menthol **AND** hydrOXYzine, docusate sodium, feeding supplement (NEPRO CARB STEADY), haloperidol lactate, labetalol, nitroGLYCERIN, ondansetron **OR** ondansetron (ZOFRAN) IV, sodium chloride flush, sorbitol  Current Labs: reviewed  Results for CONLEIGH, HEINLEIN" (MRN [id number]) as of 04/28/2019 10:06  Ref. Range 04/21/2019 05:25  Saturation Ratios Latest Ref Range: 10.4 - 31.8 % 25  Ferritin Latest Ref Range: 11 - 307 ng/mL 47   Results for JARIKA, ROBBEN" (MRN [id number]) as of 04/28/2019 10:06  Ref. Range 04/21/2019 05:25  PTH, Intact Latest Ref Range: 15 - 65 pg/mL 187 (H)    Physical Exam:  Blood pressure (!) 125/55, pulse 72, temperature 98.5 F (36.9 C), temperature source Oral, resp. rate 15, height 5' (1.524 m), weight 62.2 kg, SpO2 97 %. Pleasant, calm, her usual self RRR LI  A 1. New ESRD, started HD 1. L IJ TDC VVS for large infiltration of lUE AVF 2. LUE AVF, as above, letting infiltration resolve, readdress with expert cannulator as outpt 3. CLIP complete to [facility] 11:55am 4. Plan was for HHD, will see how things go from here 2. Anemia  1. Fe indices above 2. 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 Delirium / AMS; improved, sig sundowning; discussed with husband 5. Deconditioning: PT/OT = HH PT/OT, supervision  P . No HD today, perhaps next 4/19 then onto [facility] schedule . Medication Issues; o Preferred narcotic agents for pain control are hydromorphone, fentanyl, and methadone. Morphine should not be used.  o Baclofen should be avoided o Avoid oral sodium phosphate and magnesium citrate based laxatives / bowel preps    Pearson Grippe MD 04/28/2019, 10:05 AM  Recent Labs  Lab 04/26/19 0446 04/27/19 0704 04/28/19 0509  NA 138 138 136  K 4.0 4.2 3.4*  CL 102  100 99  CO2 23 25 26   GLUCOSE 107* 107* 100*  BUN 39* 48* 18  CREATININE 4.86* 5.65* 3.94*  CALCIUM 9.8 9.5 9.0  PHOS 4.8* 5.0* 4.8*   Recent Labs  Lab 04/24/19 1420 04/25/19 0822 04/28/19 0509  WBC 5.8 7.7 8.1  HGB 10.0* 10.0* 10.0*  HCT 30.9* 30.3* 30.3*  MCV 92.0 91.0 92.4  PLT 186 205 190

## 2019-04-28 NOTE — Progress Notes (Signed)
PROGRESS NOTE    Dana Jackson  LZJ:[id number] DOB: [birth year] DOA: 04/20/2019 PCP: Heywood Bene, PA-C    Brief Narrative:   Dana Jackson is a 74 y.o. female  with medical history significant ofchronic kidney disease stage V not yet on hemodialysis but dialysis catheter in place, hypertension, hyperlipidemia, previous CVA, renal cell carcinoma, diabetes, coronary artery disease status post coronary artery bypass graft, status post left nephrectomy who presented to the ER with altered mental status. Husband is accompanying patient. She has apparently been progressively getting confused but mainly in the last 3 days. No obvious source of her confusion. HD was initiated in the hospital   Assessment & Plan:   Principal Problem:   Delirium Active Problems:   Renal cell carcinoma (HCC)   Essential hypertension   S/P CABG x 3   Anemia   Hyperlipidemia LDL goal <70   Obesity (BMI 30-39.9)   Type 2 diabetes mellitus with renal manifestations, controlled (HCC)   Hyperkalemia   CAD (coronary artery disease) of artery bypass graft   Acute encephalopathy   acute metabolic encephalopathy 2/2 TIA vs acute delirium vs seizure vs amyloid spell in the setting of dementia Notified by nursing this morning regarding each, right facial droop and numbness to right arm with associated drift noted to right arm/leg.  Rapid response/code stroke was initiated.  CT head 04/25/2019 with no evidence of acute intracranial normality, stable appearance of generalized parenchymal atrophy and chronic small vessel ischemic disease.  CT angio head/neck artifact, no evidence of emergence large vessel occlusion, carotid/vertebral atherosclerosis but cannot quantify stenosis due to degree of artifact.  MR brain negative for acute infarct, no brain edema; but notable for chronic small vessel ischemia.  EEG with moderate diffuse encephalopathy, nonspecific with no seizures or epileptiform discharges  appreciated. --Neurology following, appreciate assistance --Holding off on antiepileptic therapy at this time, any further spells would consider initiation --No further neurodiagnostic testing at this time --Continue Seroquel 12.5 mg p.o. nightly and melatonin 3mg  PO qHS  B12 deficiency B12 lower end of normal.  Neurology recommends parenteral repletion. --Continue B12 IM injections daily x7 doses, then weekly x4 doses then monthly following.  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  Fistulogram noted that fistula widely patent with  Focal tortuous and narrowed segment just beyond the artery.  --Left IJ tunnel catheter placed by IR on 04/26/2019 --Nephrology plans HD today --outpatient bed assigned [facility] TTS at 1155am, Nephrology plans repeat HD 4/19 inpatient then likely dc home with outpatient HD  Type 2 diabetes mellitus: Hemoglobin A1c 5.7 on 03/27/2019, well controlled.  Diet controlled at home. --Continue insulin sliding scale while inpatient  Essential hypertension:  BP 125/55 this morning, controlled. --Metoprolol tartrate 50 mg p.o. twice daily --Continue aspirin and statin --Continue to monitor blood pressure closely  Hyperkalemia: Patient treated in the ER and improved with HD.   --Continue HD per nephrology  Obesity Body mass index is 31.99 kg/m.  HLD:  Total cholesterol 177, triglycerides 231, HDL 33, LDL 98. --Increase atorvastatin to 80 mg p.o. daily   DVT prophylaxis: Heparin Code Status: Full code Family Communication: Discussed with patient's husband who is present at bedside  Disposition: Status is: Inpatient  Remains inpatient appropriate because:Altered mental status, Ongoing diagnostic testing needed not appropriate for outpatient work up, Unsafe d/c plan, Inpatient level of care appropriate due  to severity  of illness and Awaiting outpatient HD bed   Dispo: The patient is from: Home              Anticipated d/c is to: Home              Anticipated d/c date is: 3 days              Patient currently is not medically stable to d/c.   Consultants:   Nephrology  Neurology  Procedures:  Left arm fistulogram 04/24/2019 Left brachiocephalic fistula is patent.  Focal tortuous and narrowed segment just beyond the artery.  Arterial anastomosis is widely patent.  Small dissection flap (not flow limiting) near puncture site. Central veins patent.   EEG 04/25/2019: IMPRESSION: This study suggestive of moderate diffuse encephalopathy, nonspecific to etiology.No seizures or epileptiform discharges were seen throughout the recording.  Event button was pressed multiple times on 4/40/2021 and 04/26/2019 as described above without concomitant EEG change and therefore not epileptic.  Antimicrobials:   None   Subjective:  Patient seen and examined at bedside this morning, husband present.  Appears more calm and collected this morning.  Yesterday during dialysis was confused and pulling at lines and overnight was agitated requiring high dose of Haldol.  After she received her melatonin and Seroquel, she slept well for the rest the night per nursing staff.  Has now received an HD outpatient bed at [facility], Tuesday/Thursday/Saturday schedule at 11:55 AM.  Nephrology plans 1 more inpatient dialysis scheduled for 04/30/2019 then can likely discharge home.  Patient and husband with no concerns or complaints this morning. Denies headache, no fever/chills/night sweats, no chest pain, no palpitations, no shortness of breath, no abdominal pain.  No acute events overnight per nursing staff.  Objective: Vitals:   04/27/19 1946 04/27/19 2111 04/28/19 0456 04/28/19 0900  BP: (!) 134/58  (!) 125/55   Pulse: 82 92 72   Resp: (!) 21  15 16   Temp: 98 F (36.7 C)  98.5 F (36.9 C)   TempSrc: Oral   Oral   SpO2: 95%  97%   Weight:   62.2 kg   Height:        Intake/Output Summary (Last 24 hours) at 04/28/2019 1351 Last data filed at 04/27/2019 2202 Gross per 24 hour  Intake 10 ml  Output 1500 ml  Net -1490 ml   Filed Weights   04/27/19 1300 04/27/19 1650 04/28/19 0456  Weight: 76.3 kg 74.8 kg 62.2 kg    Examination:  General exam: Appears calm and comfortable Respiratory system: Clear to auscultation. Respiratory effort normal.  Oxygenating well on room air Cardiovascular system: S1 & S2 heard, RRR. No JVD, murmurs, rubs, gallops or clicks. No pedal edema. Gastrointestinal system: Abdomen is nondistended, soft and nontender. No organomegaly or masses felt. Normal bowel sounds heard. Central nervous system: Alert, oriented to time and person, place and time  Extremities: Symmetric 5 x 5 power. Skin: No rashes, lesions or ulcers Psychiatry: Judgement and insight appear poor. Mood & affect appropriate.     Data Reviewed: I have personally reviewed following labs and imaging studies  CBC: Recent Labs  Lab 04/24/19 1420 04/25/19 0822 04/28/19 0509  WBC 5.8 7.7 8.1  HGB 10.0* 10.0* 10.0*  HCT 30.9* 30.3* 30.3*  MCV 92.0 91.0 92.4  PLT 186 205 706   Basic Metabolic Panel: Recent Labs  Lab 04/24/19 0404 04/25/19 0822 04/26/19 0446 04/27/19 0704 04/28/19 0509  NA 138 137 138 138 136  K 4.3  4.1 4.0 4.2 3.4*  CL 105 100 102 100 99  CO2 23 26 23 25 26   GLUCOSE 122* 188* 107* 107* 100*  BUN 50* 35* 39* 48* 18  CREATININE 4.76* 4.03* 4.86* 5.65* 3.94*  CALCIUM 9.6 9.5 9.8 9.5 9.0  MG  --   --  2.1  --  1.9  PHOS  --   --  4.8* 5.0* 4.8*   GFR: Estimated Creatinine Clearance: 10.3 mL/min (A) (by C-G formula based on SCr of 3.94 mg/dL (H)). Liver Function Tests: Recent Labs  Lab 04/25/19 0822 04/27/19 0704 04/28/19 0509  AST 40  --   --   ALT 41  --   --   ALKPHOS 86  --   --   BILITOT 0.9  --   --   PROT 5.4*  --   --   ALBUMIN 3.0* 2.9* 3.0*   No  results for input(s): LIPASE, AMYLASE in the last 168 hours. Recent Labs  Lab 04/22/19 0603  AMMONIA 21   Coagulation Profile: No results for input(s): INR, PROTIME in the last 168 hours. Cardiac Enzymes: No results for input(s): CKTOTAL, CKMB, CKMBINDEX, TROPONINI in the last 168 hours. BNP (last 3 results) No results for input(s): PROBNP in the last 8760 hours. HbA1C: Recent Labs    04/27/19 0704  HGBA1C 5.6   CBG: Recent Labs  Lab 04/27/19 1155 04/27/19 1723 04/27/19 2145 04/28/19 0749 04/28/19 1126  GLUCAP 137* 168* 125* 130* 194*   Lipid Profile: Recent Labs    04/27/19 0704  CHOL 177  HDL 33*  LDLCALC 98  TRIG 231*  CHOLHDL 5.4   Thyroid Function Tests: Recent Labs    04/26/19 0446  TSH 2.028   Anemia Panel: No results for input(s): VITAMINB12, FOLATE, FERRITIN, TIBC, IRON, RETICCTPCT in the last 72 hours. Sepsis Labs: No results for input(s): PROCALCITON, LATICACIDVEN in the last 168 hours.  Recent Results (from the past 240 hour(s))  SARS CORONAVIRUS 2 (TAT 6-24 HRS) Nasopharyngeal Nasopharyngeal Swab     Status: None   Collection Time: 04/20/19 10:11 PM   Specimen: Nasopharyngeal Swab  Result Value Ref Range Status   SARS Coronavirus 2 NEGATIVE NEGATIVE Final    Comment: (NOTE) SARS-CoV-2 target nucleic acids are NOT DETECTED. The SARS-CoV-2 RNA is generally detectable in upper and lower respiratory specimens during the acute phase of infection. Negative results do not preclude SARS-CoV-2 infection, do not rule out co-infections with other pathogens, and should not be used as the sole basis for treatment or other patient management decisions. Negative results must be combined with clinical observations, patient history, and epidemiological information. The expected result is Negative. Fact Sheet for Patients: SugarRoll.be Fact Sheet for Healthcare Providers: https://www.woods-mathews.com/ This test  is not yet approved or cleared by the [place] FDA and  has been authorized for detection and/or diagnosis of SARS-CoV-2 by FDA under an Emergency Use Authorization (EUA). This EUA will remain  in effect (meaning this test can be used) for the duration of the COVID-19 declaration under Section 56 4(b)(1) of the Act, 21 U.S.C. section 360bbb-3(b)(1), unless the authorization is terminated or revoked sooner. Performed at [facility], [place] [address] [place], [place] [address]          Radiology Studies: IR Fluoro Guide CV Line Left  Result Date: 04/26/2019 INDICATION: 74 year old with end-stage renal disease. Patient has a left arm fistula which is difficult to cannulate. Request for tunneled dialysis catheter placement. Patient has a right  chest Port-A-Cath. EXAM: FLUOROSCOPIC AND ULTRASOUND GUIDED PLACEMENT OF A TUNNELED DIALYSIS CATHETER Physician: Stephan Minister. Anselm Pancoast, MD MEDICATIONS: Ancef 2 g; The antibiotic was administered within an appropriate time interval prior to skin puncture. ANESTHESIA/SEDATION: Versed 1.0 mg IV; Fentanyl 75 mcg IV; Moderate Sedation Time:  24 minutes The patient was continuously monitored during the procedure by the interventional radiology nurse under my direct supervision. FLUOROSCOPY TIME:  Fluoroscopy Time: 30 seconds, 1 mGy COMPLICATIONS: None immediate. PROCEDURE: Informed consent was obtained for placement of a tunneled dialysis catheter. The patient was placed supine on the interventional table. Ultrasound confirmed a patent left internal jugular vein. Ultrasound images were obtained for documentation. The left neck and chest was prepped and draped in a sterile fashion. The left neck was anesthetized with 1% lidocaine. Maximal barrier sterile technique was utilized including caps, mask, sterile gowns, sterile gloves, sterile drape, hand hygiene and skin antiseptic. A small incision was made with #11 blade scalpel. A 21 gauge needle directed into the left  internal jugular vein with ultrasound guidance. A micropuncture dilator set was placed. A 23 cm tip to cuff Palindrome catheter was selected. The skin below the left clavicle was anesthetized and a small incision was made with an #11 blade scalpel. A subcutaneous tunnel was formed to the vein dermatotomy site. The catheter was brought through the tunnel. The vein dermatotomy site was dilated to accommodate a peel-away sheath. The catheter was placed through the peel-away sheath and directed into the central venous structures. The tip of the catheter was placed at the SVC and right atrium junction with fluoroscopy. Fluoroscopic images were obtained for documentation. Both lumens were found to aspirate and flush well. The proper amount of heparin was flushed in both lumens. The vein dermatotomy site was closed using a single layer of absorbable suture and Dermabond. Gel-Foam placed in subcutaneous tract. The catheter was secured to the skin using Prolene suture. IMPRESSION: Successful placement of a left jugular tunneled dialysis catheter using ultrasound and fluoroscopic guidance. Electronically Signed   By: Markus Daft M.D.   On: 04/26/2019 16:51   IR US Guide Vasc Access Left  Result Date: 04/26/2019 INDICATION: 74 year old with end-stage renal disease. Patient has a left arm fistula which is difficult to cannulate. Request for tunneled dialysis catheter placement. Patient has a right chest Port-A-Cath. EXAM: FLUOROSCOPIC AND ULTRASOUND GUIDED PLACEMENT OF A TUNNELED DIALYSIS CATHETER Physician: Stephan Minister. Anselm Pancoast, MD MEDICATIONS: Ancef 2 g; The antibiotic was administered within an appropriate time interval prior to skin puncture. ANESTHESIA/SEDATION: Versed 1.0 mg IV; Fentanyl 75 mcg IV; Moderate Sedation Time:  24 minutes The patient was continuously monitored during the procedure by the interventional radiology nurse under my direct supervision. FLUOROSCOPY TIME:  Fluoroscopy Time: 30 seconds, 1 mGy COMPLICATIONS:  None immediate. PROCEDURE: Informed consent was obtained for placement of a tunneled dialysis catheter. The patient was placed supine on the interventional table. Ultrasound confirmed a patent left internal jugular vein. Ultrasound images were obtained for documentation. The left neck and chest was prepped and draped in a sterile fashion. The left neck was anesthetized with 1% lidocaine. Maximal barrier sterile technique was utilized including caps, mask, sterile gowns, sterile gloves, sterile drape, hand hygiene and skin antiseptic. A small incision was made with #11 blade scalpel. A 21 gauge needle directed into the left internal jugular vein with ultrasound guidance. A micropuncture dilator set was placed. A 23 cm tip to cuff Palindrome catheter was selected. The skin below the left clavicle was anesthetized and  a small incision was made with an #11 blade scalpel. A subcutaneous tunnel was formed to the vein dermatotomy site. The catheter was brought through the tunnel. The vein dermatotomy site was dilated to accommodate a peel-away sheath. The catheter was placed through the peel-away sheath and directed into the central venous structures. The tip of the catheter was placed at the SVC and right atrium junction with fluoroscopy. Fluoroscopic images were obtained for documentation. Both lumens were found to aspirate and flush well. The proper amount of heparin was flushed in both lumens. The vein dermatotomy site was closed using a single layer of absorbable suture and Dermabond. Gel-Foam placed in subcutaneous tract. The catheter was secured to the skin using Prolene suture. IMPRESSION: Successful placement of a left jugular tunneled dialysis catheter using ultrasound and fluoroscopic guidance. Electronically Signed   By: Markus Daft M.D.   On: 04/26/2019 16:51        Scheduled Meds: . acidophilus  1 capsule Oral Daily  . aspirin EC  81 mg Oral QHS  . atorvastatin  80 mg Oral QHS  . calcitRIOL  0.25  mcg Oral Daily  . Chlorhexidine Gluconate Cloth  6 each Topical Q0600  . cyanocobalamin  1,000 mcg Intramuscular Daily  . darbepoetin (ARANESP) injection - DIALYSIS  60 mcg Intravenous Q Sat-HD  . famotidine  20 mg Oral QHS  . furosemide  20 mg Oral Once per day on Tue Fri  . gabapentin  100 mg Oral TID  . heparin  5,000 Units Subcutaneous Q8H  . insulin aspart  0-5 Units Subcutaneous QHS  . insulin aspart  0-6 Units Subcutaneous TID WC  . isosorbide mononitrate  60 mg Oral Daily  . loratadine  10 mg Oral Daily  . melatonin  3 mg Oral QHS  . metoprolol tartrate  50 mg Oral BID  . pantoprazole  40 mg Oral Daily  . QUEtiapine  12.5 mg Oral QHS  . sodium chloride flush  10-40 mL Intracatheter Q12H   Continuous Infusions: . ferric gluconate (FERRLECIT/NULECIT) IV 125 mg (04/27/19 1701)     LOS: 8 days    Time spent: 36 minutes spent on chart review, discussion with nursing staff, consultants, updating family and interview/physical exam; more than 50% of that time was spent in counseling and/or coordination of care.    Aariv Medlock J [place] DO [facility] Available via Epic secure chat 7am-7pm After these hours, please refer to coverage provider listed on [email or web address] 04/28/2019, 1:51 PM

## 2019-04-29 DIAGNOSIS — Z992 Dependence on renal dialysis: Secondary | ICD-10-CM

## 2019-04-29 DIAGNOSIS — R4182 Altered mental status, unspecified: Secondary | ICD-10-CM | POA: Diagnosis not present

## 2019-04-29 DIAGNOSIS — N186 End stage renal disease: Secondary | ICD-10-CM

## 2019-04-29 LAB — GLUCOSE, CAPILLARY
Glucose-Capillary: 146 mg/dL — ABNORMAL HIGH (ref 70–99)
Glucose-Capillary: 165 mg/dL — ABNORMAL HIGH (ref 70–99)

## 2019-04-29 MED ORDER — HEPARIN SOD (PORK) LOCK FLUSH 100 UNIT/ML IV SOLN
500.0000 [IU] | INTRAVENOUS | Status: AC | PRN
  Administered 2019-04-29: 500 [IU]
  Filled 2019-04-29: qty 5

## 2019-04-29 MED ORDER — CYANOCOBALAMIN 1000 MCG/ML IJ SOLN: 1000.0000 ug | INTRAMUSCULAR | 0 refills | Status: AC

## 2019-04-29 MED ORDER — QUETIAPINE FUMARATE 25 MG PO TABS: 12.5000 mg | ORAL_TABLET | Freq: Every day | ORAL | 2 refills | Status: DC

## 2019-04-29 MED ORDER — DARBEPOETIN ALFA 60 MCG/0.3ML IJ SOSY: 60.0000 ug | PREFILLED_SYRINGE | INTRAMUSCULAR | Status: AC

## 2019-04-29 MED ORDER — CALCITRIOL 0.25 MCG PO CAPS: 0.2500 ug | ORAL_CAPSULE | Freq: Every day | ORAL | 0 refills | Status: AC

## 2019-04-29 MED ORDER — FAMOTIDINE 20 MG PO TABS: 20.0000 mg | ORAL_TABLET | Freq: Every day | ORAL | 2 refills | Status: DC

## 2019-04-29 MED ORDER — ATORVASTATIN CALCIUM 80 MG PO TABS: 80.0000 mg | ORAL_TABLET | Freq: Every day | ORAL | 0 refills | Status: AC

## 2019-04-29 MED ORDER — MELATONIN 3 MG PO TABS: 3.0000 mg | ORAL_TABLET | Freq: Every day | ORAL | 0 refills | Status: AC

## 2019-04-29 MED ORDER — CYANOCOBALAMIN 1000 MCG/ML IJ SOLN: 1000.0000 ug | INTRAMUSCULAR | 29 refills | Status: DC

## 2019-04-29 NOTE — Discharge Summary (Signed)
Physician Discharge Summary  Chanel Mcadams NKN:[id number] DOB: 1945-06-06 DOA: 04/20/2019  PCP: Heywood Bene, PA-C  Admit date: 04/20/2019 Discharge date: 04/29/2019  Admitted From: Home Disposition:  Home  Recommendations for Outpatient Follow-up:  1. Follow up with PCP in 1-2 weeks 2. Follow-up with nephrology as scheduled 3. New hemodialysis outpatient at [facility] clinic on a Tuesday/Thursday/Saturday schedule 11:55 AM 4. Increased Lipitor to 80 mg p.o. daily 5. Decrease famotidine to 20 mg p.o. daily for renal function  Home Health: Yes, home health PT/OT/RN Equipment/Devices: Rolling walker, left IJ HD catheter in place  Discharge Condition: Stable CODE STATUS: Full code Diet recommendation: Renal, heart healthy  History of present illness:  Dana Jackson is a 74 y.o.femalewith medical history significant ofchronic kidney disease stage V not yet on hemodialysis but dialysis catheter in place, hypertension, hyperlipidemia, previous CVA, renal cell carcinoma, diabetes, coronary artery disease status post coronary artery bypass graft, status post left nephrectomy who presented to the ER with altered mental status. Husband is accompanying patient. She has apparently been progressively getting confused but mainly in the last 3 days. No obvious source of her confusion.HD was initiated in the hospital  Hospital course:  acute metabolic encephalopathy 2/2 TIA vs acute delirium vs seizure vs amyloid spell in the setting of dementia Notified by nursing this morning regarding each, right facial droop and numbness to right arm with associated drift noted to right arm/leg.  Rapid response/code stroke was initiated.  CT head 04/25/2019 with no evidence of acute intracranial normality, stable appearance of generalized parenchymal atrophy and chronic small vessel ischemic disease.  CT angio head/neck artifact, no evidence of emergence large vessel occlusion, carotid/vertebral  atherosclerosis but cannot quantify stenosis due to degree of artifact.  MR brain negative for acute infarct, no brain edema; but notable for chronic small vessel ischemia.  EEG with moderate diffuse encephalopathy, nonspecific with no seizures or epileptiform discharges appreciated.  No further diagnostic testing indicated at this time, neurology has signed off.  No recommendations of antiepileptic therapy at this time unless has any further spells in the future and then would consider.  Was started on Seroquel 12.5 mg p.o. nightly and melatonin 3 mg p.o. nightly with improvement of symptoms with better sleep.  B12 deficiency B12 lower end of normal.  Neurology recommends parenteral repletion.  Completed 7 days of B12 IM injections inpatient, will continue weekly for 4 additional doses then continue monthly.  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  Fistulogram noted that fistula widely patent with Focal tortuous and narrowed segment just beyond the artery. Left IJ tunnel catheter placed by IR on 04/26/2019. Outpatient bed assigned [facility] clinic TTS at 1155am.  Follow-up with nephrology outpatient as scheduled.  Type 2 diabetes mellitus: Hemoglobin A1c 5.7 on 03/27/2019, well controlled.  Diet controlled at home.  Essential hypertension:  BP 125/55 this morning, controlled.  Continue metoprolol tartrate 50 mg p.o. twice daily. Continue aspirin and statin  Hyperkalemia: Resolved Patient treated in the ER and improved with HD.    Obesity Body mass index is 31.99 kg/m.  HLD: Total cholesterol 177, triglycerides 231, HDL 33, LDL 98. Increased atorvastatin to 80 mg p.o. daily  Discharge Diagnoses:  Active Problems:   Renal cell carcinoma (HCC)   Essential hypertension   S/P CABG x 3   Anemia   Hyperlipidemia LDL goal <70   Obesity (BMI  30-39.9)   Type 2 diabetes mellitus with renal manifestations, controlled ([place])   CAD (coronary artery disease) of artery bypass graft   ESRD on hemodialysis [facility])    Discharge Instructions  Discharge Instructions    Call MD for:  difficulty breathing, headache or visual disturbances   Complete by: As directed    Call MD for:  extreme fatigue   Complete by: As directed    Call MD for:  persistant dizziness or light-headedness   Complete by: As directed    Call MD for:  persistant nausea and vomiting   Complete by: As directed    Call MD for:  severe uncontrolled pain   Complete by: As directed    Call MD for:  temperature >100.4   Complete by: As directed    Diet - low sodium heart healthy   Complete by: As directed    Increase activity slowly   Complete by: As directed      Allergies as of 04/29/2019      Reactions   Sulfa Antibiotics Other (See Comments)   "AKI"   Erythromycin Itching   Pravastatin Other (See Comments)   Used 01/2011 to 03/2011 (reaction unknown)   Simvastatin Other (See Comments)   Reaction not known   Codeine Nausea Only, Other (See Comments)   Reaction not known, but can tolerate Hycodan   Gemfibrozil Itching      Medication List    TAKE these medications   Align 4 MG Caps Take 1 capsule by mouth daily.   Alpha-Lipoic Acid 600 MG Caps Take 600 mg by mouth daily.   aspirin 81 MG tablet Take 81 mg by mouth at bedtime.   atorvastatin 80 MG tablet Commonly known as: LIPITOR Take 1 tablet (80 mg total) by mouth at bedtime. What changed:   medication strength  how much to take   calcitRIOL 0.25 MCG capsule Commonly known as: ROCALTROL Take 1 capsule (0.25 mcg total) by mouth daily. Start taking on: April 30, 2019   cyanocobalamin 1000 MCG/ML injection Commonly known as: (VITAMIN B-12) Inject 1 mL (1,000 mcg total) into the muscle once a week for 28 days. Start taking on: May 06, 2019   cyanocobalamin 1000 MCG/ML  injection Commonly known as: (VITAMIN B-12) Inject 1 mL (1,000 mcg total) into the muscle every 30 (thirty) days for 90 doses. Start taking on: June 27, 2019   Darbepoetin Alfa 60 MCG/0.3ML Sosy injection Commonly known as: ARANESP Inject 0.3 mLs (60 mcg total) into the vein every Tuesday with hemodialysis. Start taking on: May 01, 2019   famotidine 20 MG tablet Commonly known as: PEPCID Take 1 tablet (20 mg total) by mouth at bedtime. What changed: how much to take   Fish Oil 1200 MG Caps Take 1,200 mg by mouth in the morning and at bedtime.   furosemide 20 MG tablet Commonly known as: LASIX Take 20 mg by mouth See admin instructions. Takes 1 tablet 2 times daily as needed on Tuesday and Friday.   gabapentin 100 MG capsule Commonly known as: NEURONTIN Take 100 mg by mouth 3 (three) times daily.   isosorbide mononitrate 60 MG 24 hr tablet Commonly known as: IMDUR Take 1 tablet (60 mg total) by mouth daily.   loratadine 10 MG tablet Commonly known as: CLARITIN Take 10 mg by mouth daily.   magnesium oxide 400 MG tablet Commonly known as: MAG-OX Take 400 mg by mouth daily.   melatonin 3 MG Tabs tablet Take 1 tablet (3  mg total) by mouth at bedtime.   metoprolol tartrate 50 MG tablet Commonly known as: LOPRESSOR Take 1 tablet (50 mg total) by mouth 2 (two) times daily.   nitroGLYCERIN 0.4 MG SL tablet Commonly known as: NITROSTAT PLACE 1 TABLET (0.4 MG TOTAL) UNDER THE TONGUE EVERY 5 (FIVE) MINUTES AS NEEDED FOR CHEST PAIN. What changed: See the new instructions.   omeprazole 40 MG capsule Commonly known as: PRILOSEC Take 40 mg by mouth daily before breakfast.   pyridOXINE 100 MG tablet Commonly known as: VITAMIN B-6 Take 200 mg by mouth daily.   QUEtiapine 25 MG tablet Commonly known as: SEROQUEL Take 0.5 tablets (12.5 mg total) by mouth at bedtime.            Durable Medical Equipment  (From admission, onward)         Start     Ordered    04/29/19 0738  For home use only DME 3 n 1  Once     04/29/19 0737   04/29/19 0738  For home use only DME Walker  Once    Question:  Patient needs a walker to treat with the following condition  Answer:  Gait disturbance   04/29/19 0737         Follow-up Information    Heywood Bene, PA-C. Schedule an appointment as soon as possible for a visit in 1 week(s).   Specialty: Physician Assistant Contact information: [address] [place] [address] [phone]          Allergies  Allergen Reactions  . Sulfa Antibiotics Other (See Comments)    "AKI"   . Erythromycin Itching  . Pravastatin Other (See Comments)    Used 01/2011 to 03/2011 (reaction unknown)  . Simvastatin Other (See Comments)    Reaction not known  . Codeine Nausea Only and Other (See Comments)    Reaction not known, but can tolerate Hycodan  . Gemfibrozil Itching    Consultations:  Nephrology  Neurology   Procedures/Studies: CT ANGIO HEAD W OR WO CONTRAST  Result Date: 04/25/2019 CLINICAL DATA:  Aphasia. EXAM: CT ANGIOGRAPHY HEAD AND NECK TECHNIQUE: Multidetector CT imaging of the head and neck was performed using the standard protocol during bolus administration of intravenous contrast. Multiplanar CT image reconstructions and MIPs were obtained to evaluate the vascular anatomy. Carotid stenosis measurements (when applicable) are obtained utilizing NASCET criteria, using the distal internal carotid diameter as the denominator. CONTRAST:  Dose is currently not available COMPARISON:  04/20/2019 head CT and 04/22/2019 brain MRI FINDINGS: CTA NECK FINDINGS Aortic arch: Status post CABG. Atherosclerosis without acute finding such as dissection. Right carotid system: Significant motion degradation throughout the neck. There is calcified plaque at the bifurcation with no possible estimate of narrowing. Left carotid system: Extensive motion artifact throughout the neck. There is calcified plaque at  the bifurcation with some degree of stenosis that is not quantifiable. Vertebral arteries: No proximal subclavian flow limiting stenosis. There is calcified plaque at both subclavian arteries. Diffuse motion artifact affecting the vertebral arteries, which are patent to the dura. Skeleton: Cervical spine degeneration with multilevel ACDF. Other neck: No acute finding. Upper chest: Nodular density in the lingula the level of the hilum, measuring 8 mm-stable from 2011. Review of the MIP images confirms the above findings CTA HEAD FINDINGS Anterior circulation: Significant motion artifact. No evidence of emergent large vessel occlusion. There is atherosclerotic plaque of the carotid siphons with luminal stenosis that is not well assessed. Posterior circulation:  Extensive atherosclerotic plaque. Symmetric patent vertebral arteries and diffusely patent basilar. Symmetric flow in the posterior cerebral arteries which are significantly degraded by motion. Venous sinuses: Unremarkable in the arterial phase Anatomic variants: None significant Review of the MIP images confirms the above findings IMPRESSION: 1. Extensive motion artifact with no evidence of emergent large vessel occlusion. 2. Carotid and vertebral atherosclerosis. Cannot quantify any stenosis due to the degree of artifact. Electronically Signed   By: Monte Fantasia M.D.   On: 04/25/2019 10:34   CT Head Wo Contrast  Result Date: 04/20/2019 CLINICAL DATA:  Altered mental status EXAM: CT HEAD WITHOUT CONTRAST TECHNIQUE: Contiguous axial images were obtained from the base of the skull through the vertex without intravenous contrast. COMPARISON:  03/27/2019 FINDINGS: Brain: There is atrophy and chronic small vessel disease changes. No acute intracranial abnormality. Specifically, no hemorrhage, hydrocephalus, mass lesion, acute infarction, or significant intracranial injury. Vascular: No hyperdense vessel or unexpected calcification. Skull: No acute calvarial  abnormality. Sinuses/Orbits: Visualized paranasal sinuses and mastoids clear. Orbital soft tissues unremarkable. Other: None IMPRESSION: Atrophy, chronic microvascular disease. No acute intracranial abnormality. Electronically Signed   By: Rolm Baptise M.D.   On: 04/20/2019 18:55   CT ANGIO NECK W OR WO CONTRAST  Result Date: 04/25/2019 CLINICAL DATA:  Aphasia. EXAM: CT ANGIOGRAPHY HEAD AND NECK TECHNIQUE: Multidetector CT imaging of the head and neck was performed using the standard protocol during bolus administration of intravenous contrast. Multiplanar CT image reconstructions and MIPs were obtained to evaluate the vascular anatomy. Carotid stenosis measurements (when applicable) are obtained utilizing NASCET criteria, using the distal internal carotid diameter as the denominator. CONTRAST:  Dose is currently not available COMPARISON:  04/20/2019 head CT and 04/22/2019 brain MRI FINDINGS: CTA NECK FINDINGS Aortic arch: Status post CABG. Atherosclerosis without acute finding such as dissection. Right carotid system: Significant motion degradation throughout the neck. There is calcified plaque at the bifurcation with no possible estimate of narrowing. Left carotid system: Extensive motion artifact throughout the neck. There is calcified plaque at the bifurcation with some degree of stenosis that is not quantifiable. Vertebral arteries: No proximal subclavian flow limiting stenosis. There is calcified plaque at both subclavian arteries. Diffuse motion artifact affecting the vertebral arteries, which are patent to the dura. Skeleton: Cervical spine degeneration with multilevel ACDF. Other neck: No acute finding. Upper chest: Nodular density in the lingula the level of the hilum, measuring 8 mm-stable from 2011. Review of the MIP images confirms the above findings CTA HEAD FINDINGS Anterior circulation: Significant motion artifact. No evidence of emergent large vessel occlusion. There is atherosclerotic plaque of  the carotid siphons with luminal stenosis that is not well assessed. Posterior circulation: Extensive atherosclerotic plaque. Symmetric patent vertebral arteries and diffusely patent basilar. Symmetric flow in the posterior cerebral arteries which are significantly degraded by motion. Venous sinuses: Unremarkable in the arterial phase Anatomic variants: None significant Review of the MIP images confirms the above findings IMPRESSION: 1. Extensive motion artifact with no evidence of emergent large vessel occlusion. 2. Carotid and vertebral atherosclerosis. Cannot quantify any stenosis due to the degree of artifact. Electronically Signed   By: Monte Fantasia M.D.   On: 04/25/2019 10:34   MR BRAIN WO CONTRAST  Result Date: 04/25/2019 CLINICAL DATA:  Aphasia. EXAM: MRI HEAD WITHOUT CONTRAST TECHNIQUE: Multiplanar, multiecho pulse sequences of the brain and surrounding structures were obtained without intravenous contrast. COMPARISON:  Three days ago FINDINGS: Brain: Limited sequence MRI performed in the critical setting of this altered patient.  Diffusion, gradient, and FLAIR imaging was performed. Diffusion imaging is good quality in 2 planes ; there is no acute infarct. Moderate chronic small vessel ischemic type change in the cerebral white matter and pons, accentuated by motion artifact. There is central predominant cerebral volume loss. Vascular: No T2 weighted imaging to assess flow voids Skull and upper cervical spine: Left frontal bone osteoma. No significant finding Sinuses/Orbits: Negative IMPRESSION: 1. Negative for acute infarct or brain edema. 2. Chronic small vessel ischemia in the white matter and pons. 3. Truncated and motion degraded study due to patient condition. Electronically Signed   By: Monte Fantasia M.D.   On: 04/25/2019 11:08   MR BRAIN WO CONTRAST  Result Date: 04/22/2019 CLINICAL DATA:  74 year old female with recent altered mental status common cephalopathy. EXAM: MRI HEAD WITHOUT  CONTRAST TECHNIQUE: Multiplanar, multiecho pulse sequences of the brain and surrounding structures were obtained without intravenous contrast. COMPARISON:  Head CT 04/20/2019. Brain MRI 09/08/2005. FINDINGS: Brain: No restricted diffusion to suggest acute infarction. No midline shift, mass effect, evidence of mass lesion, ventriculomegaly, extra-axial collection or acute intracranial hemorrhage. Cervicomedullary junction and pituitary are within normal limits. Cerebral volume loss since 2007 appears generalized. No cortical encephalomalacia or chronic cerebral blood products identified. Chronic but increased, patchy and confluent bilateral cerebral white matter T2 and FLAIR hyperintensity. Some deep white matter capsule involvement as before. Comparatively mild T2 heterogeneity in the deep gray matter nuclei. Moderate T2 hyperintensity in the pons has also progressed. Vascular: Major intracranial vascular flow voids are stable since 2007 and within normal limits. Skull and upper cervical spine: Partially visible cervical ACDF with no adverse features identified. Visualized bone marrow signal is within normal limits. Sinuses/Orbits: Postoperative changes to both globes, otherwise negative orbits. Mild left sphenoid sinus mucosal thickening is chronic, other paranasal sinuses remain well pneumatized. Other: Mastoids are clear. Visible internal auditory structures appear normal. Scalp and face soft tissues appear negative. IMPRESSION: 1. No acute intracranial abnormality. 2. Chronic but progressed signal changes in the cerebral white matter, deep gray nuclei and pons, most commonly due to chronic small vessel disease. Electronically Signed   By: Genevie Ann M.D.   On: 04/22/2019 16:56   IR Fluoro Guide CV Line Left  Result Date: 04/26/2019 INDICATION: 74 year old with end-stage renal disease. Patient has a left arm fistula which is difficult to cannulate. Request for tunneled dialysis catheter placement. Patient has a  right chest Port-A-Cath. EXAM: FLUOROSCOPIC AND ULTRASOUND GUIDED PLACEMENT OF A TUNNELED DIALYSIS CATHETER Physician: Stephan Minister. Anselm Pancoast, MD MEDICATIONS: Ancef 2 g; The antibiotic was administered within an appropriate time interval prior to skin puncture. ANESTHESIA/SEDATION: Versed 1.0 mg IV; Fentanyl 75 mcg IV; Moderate Sedation Time:  24 minutes The patient was continuously monitored during the procedure by the interventional radiology nurse under my direct supervision. FLUOROSCOPY TIME:  Fluoroscopy Time: 30 seconds, 1 mGy COMPLICATIONS: None immediate. PROCEDURE: Informed consent was obtained for placement of a tunneled dialysis catheter. The patient was placed supine on the interventional table. Ultrasound confirmed a patent left internal jugular vein. Ultrasound images were obtained for documentation. The left neck and chest was prepped and draped in a sterile fashion. The left neck was anesthetized with 1% lidocaine. Maximal barrier sterile technique was utilized including caps, mask, sterile gowns, sterile gloves, sterile drape, hand hygiene and skin antiseptic. A small incision was made with #11 blade scalpel. A 21 gauge needle directed into the left internal jugular vein with ultrasound guidance. A micropuncture dilator set was placed. A  23 cm tip to cuff Palindrome catheter was selected. The skin below the left clavicle was anesthetized and a small incision was made with an #11 blade scalpel. A subcutaneous tunnel was formed to the vein dermatotomy site. The catheter was brought through the tunnel. The vein dermatotomy site was dilated to accommodate a peel-away sheath. The catheter was placed through the peel-away sheath and directed into the central venous structures. The tip of the catheter was placed at the SVC and right atrium junction with fluoroscopy. Fluoroscopic images were obtained for documentation. Both lumens were found to aspirate and flush well. The proper amount of heparin was flushed in both  lumens. The vein dermatotomy site was closed using a single layer of absorbable suture and Dermabond. Gel-Foam placed in subcutaneous tract. The catheter was secured to the skin using Prolene suture. IMPRESSION: Successful placement of a left jugular tunneled dialysis catheter using ultrasound and fluoroscopic guidance. Electronically Signed   By: Markus Daft M.D.   On: 04/26/2019 16:51   IR US Guide Vasc Access Left  Result Date: 04/26/2019 INDICATION: 74 year old with end-stage renal disease. Patient has a left arm fistula which is difficult to cannulate. Request for tunneled dialysis catheter placement. Patient has a right chest Port-A-Cath. EXAM: FLUOROSCOPIC AND ULTRASOUND GUIDED PLACEMENT OF A TUNNELED DIALYSIS CATHETER Physician: Stephan Minister. Anselm Pancoast, MD MEDICATIONS: Ancef 2 g; The antibiotic was administered within an appropriate time interval prior to skin puncture. ANESTHESIA/SEDATION: Versed 1.0 mg IV; Fentanyl 75 mcg IV; Moderate Sedation Time:  24 minutes The patient was continuously monitored during the procedure by the interventional radiology nurse under my direct supervision. FLUOROSCOPY TIME:  Fluoroscopy Time: 30 seconds, 1 mGy COMPLICATIONS: None immediate. PROCEDURE: Informed consent was obtained for placement of a tunneled dialysis catheter. The patient was placed supine on the interventional table. Ultrasound confirmed a patent left internal jugular vein. Ultrasound images were obtained for documentation. The left neck and chest was prepped and draped in a sterile fashion. The left neck was anesthetized with 1% lidocaine. Maximal barrier sterile technique was utilized including caps, mask, sterile gowns, sterile gloves, sterile drape, hand hygiene and skin antiseptic. A small incision was made with #11 blade scalpel. A 21 gauge needle directed into the left internal jugular vein with ultrasound guidance. A micropuncture dilator set was placed. A 23 cm tip to cuff Palindrome catheter was selected.  The skin below the left clavicle was anesthetized and a small incision was made with an #11 blade scalpel. A subcutaneous tunnel was formed to the vein dermatotomy site. The catheter was brought through the tunnel. The vein dermatotomy site was dilated to accommodate a peel-away sheath. The catheter was placed through the peel-away sheath and directed into the central venous structures. The tip of the catheter was placed at the SVC and right atrium junction with fluoroscopy. Fluoroscopic images were obtained for documentation. Both lumens were found to aspirate and flush well. The proper amount of heparin was flushed in both lumens. The vein dermatotomy site was closed using a single layer of absorbable suture and Dermabond. Gel-Foam placed in subcutaneous tract. The catheter was secured to the skin using Prolene suture. IMPRESSION: Successful placement of a left jugular tunneled dialysis catheter using ultrasound and fluoroscopic guidance. Electronically Signed   By: Markus Daft M.D.   On: 04/26/2019 16:51   IR US Guide Vasc Access Left  Result Date: 04/24/2019 INDICATION: 74 year old with end-stage renal disease. Left brachiocephalic fistula was placed in 2019 and recently started using for hemodialysis. Difficult  cannulation of the fistula and concern for fistula infiltration. Request for fistulagram. EXAM: LEFT UPPER EXTREMITY FISTULOGRAM ULTRASOUND GUIDANCE FOR VASCULAR ACCESS MEDICATIONS: None. ANESTHESIA/SEDATION: None FLUOROSCOPY TIME:  Fluoroscopy Time: 48 seconds, 7 mGy CONTRAST:  40 mL Omnipaque [id number] COMPLICATIONS: None immediate. PROCEDURE: Informed consent was obtained for a fistulogram. Maximal Sterile Barrier Technique was utilized including caps, mask, sterile gowns, sterile gloves, sterile drape, hand hygiene and skin antiseptic. A timeout was performed prior to the initiation of the procedure. The left antecubital region and upper arm was prepped and draped in sterile fashion. Ultrasound  confirmed a patent cephalic vein and fistula. Ultrasound images were saved for documentation. Skin was anesthetized with 1% lidocaine. Using ultrasound guidance, 21 gauge needle was directed into the cephalic vein near the anastomosis. Puncturing the vein was mildly difficult due to the tortuosity of the vessel. Vein was successfully punctured with ultrasound guidance and micropuncture dilator set was placed. A series of fistulogram were obtained. Multiple reflux images were obtained in different projections in order to evaluate the arterial anastomosis. Catheter was removed with manual compression. FINDINGS: Left brachiocephalic fistula is patent. Small collateral vessels in the mid humeral region draining into the brachial venous system. Central veins are patent. Patient has a right jugular Port-A-Cath with the tip at the SVC and right atrium junction. Arterial anastomosis is widely patent. Approximately 4-5 cm from the arterial anastomosis, there is segment of tortuosity and narrowing in the cephalic vein. In addition, there is evidence for a small dissection flap along the anterior aspect of the cephalic vein near the puncture site. IMPRESSION: 1. Left brachiocephalic fistula is patent. 2. Focal area of tortuosity and narrowing in the cephalic vein approximately 4-5 cm from the anastomosis. This narrowing appears to be related to the tortuosity rather than intimal hyperplasia. No intervention was performed. 3. Short segment intimal flap or non flow limiting dissection along the anterior aspect of the cephalic vein near the cannulation site. This small dissection may be iatrogenic from the procedure. ACCESS: This access remains amenable to future percutaneous interventions as clinically indicated. Electronically Signed   By: Markus Daft M.D.   On: 04/24/2019 15:13   EEG adult  Result Date: 04/25/2019 Lora Havens, MD     04/25/2019 12:59 PM Patient Name: Dana Jackson MRN: [id number] Epilepsy Attending:  Lora Havens Referring Physician/Provider: Dr. Kathrynn Speed Date: 04/25/2019 Duration: 25.35 minutes Patient history: 74 year old female who was initially admitted for confusion and today was noted to have an episode of right-sided weakness and speech disturbance.  EEG to evaluate for seizures. Level of alertness: Lethargic AEDs during EEG study: None Technical aspects: This EEG study was done with scalp electrodes positioned according to the 10-20 International system of electrode placement. Electrical activity was acquired at a sampling rate of 500Hz  and reviewed with a high frequency filter of 70Hz  and a low frequency filter of 1Hz . EEG data were recorded continuously and digitally stored. Description: EEG showed continuous generalized low amplitude 3 to 5 Hz theta-delta slowing.  No clear posterior dominant rhythm was seen.  Hyperventilation and photic stimulation were not performed. Of note, study was technically difficult due to significant myogenic and electrode artifact. Abnormality -Continuous slow, generalized IMPRESSION: This technically difficult study suggestive of moderate diffuse encephalopathy, nonspecific to etiology.  No seizures or epileptiform discharges were seen throughout the recording. Priyanka Barbra Sarks   Overnight EEG with video  Result Date: 04/26/2019 Lora Havens, MD     04/26/2019 11:58 AM Patient Name: Mardene Celeste  Ewald MRN: [id number] Epilepsy Attending: Lora Havens Referring Physician/Provider: Dr. Kathrynn Speed Duration: 04/25/2019 1252 to 04/26/2019 1031 Patient history: 74 year old female who was initially admitted for confusion and today was noted to have an episode of right-sided weakness and speech disturbance.  EEG to evaluate for seizures.  Level of alertness: awake, asleep  AEDs during EEG study: Gabapentin  Technical aspects: This EEG study was done with scalp electrodes positioned according to the 10-20 International system of electrode placement.  Electrical activity was acquired at a sampling rate of 500Hz  and reviewed with a high frequency filter of 70Hz  and a low frequency filter of 1Hz . EEG data were recorded continuously and digitally stored.  Description: No clear posterior dominant rhythm was seen. Sleep was characterized by vertex waves, sleep spindles (12-14hz ) maximal frontocentral. EEG showed continuous generalized low amplitude 3 to 5 Hz theta-delta slowing. Hyperventilation and photic stimulation were not performed. Event button was pressed on 04/25/2019 and 04/26/2019 at following times 1413: Husband helping patient move, unclear why button was pressed 1432: Staff helping patient in bed, unclear why button was pressed 1456: Patient off camera, unclear why button was pressed 1830: Staff repositioning patient, unclear why button was pressed 1907: Patient laying with eyes closed in bed, unclear why button was pressed 2031: Staff repositioning patient, unclear what and was pressed 2057 : Patient laying in bed, staff auscultating patient, unclear why button was pressed 0406: Staff cleaning and talking to patient, unclear why button was pressed 0425: Staff asked patient if she is doing okay patient responded, unclear why button was pressed 0800: Sitting in bed with breakfast tray in front of her, saying something to husband but difficult to hear.  Unclear by button was pressed Concomitant EEG before, during and after all these pushbutton events did not show any EEG changes suggest seizure activity.  Abnormality -Continuous slow, generalized  IMPRESSION: This study suggestive of moderate diffuse encephalopathy, nonspecific to etiology.  No seizures or epileptiform discharges were seen throughout the recording. Event button was pressed multiple times on 4/40/2021 and 04/26/2019 as described above without concomitant EEG change and therefore not epileptic.  Priyanka O Yadav   IR DIALY SHUNT INTRO NEEDLE/INTRACATH INITIAL W/IMG LEFT  Result Date:  04/24/2019 INDICATION: 74 year old with end-stage renal disease. Left brachiocephalic fistula was placed in 2019 and recently started using for hemodialysis. Difficult cannulation of the fistula and concern for fistula infiltration. Request for fistulagram. EXAM: LEFT UPPER EXTREMITY FISTULOGRAM ULTRASOUND GUIDANCE FOR VASCULAR ACCESS MEDICATIONS: None. ANESTHESIA/SEDATION: None FLUOROSCOPY TIME:  Fluoroscopy Time: 48 seconds, 7 mGy CONTRAST:  40 mL Omnipaque [id number] COMPLICATIONS: None immediate. PROCEDURE: Informed consent was obtained for a fistulogram. Maximal Sterile Barrier Technique was utilized including caps, mask, sterile gowns, sterile gloves, sterile drape, hand hygiene and skin antiseptic. A timeout was performed prior to the initiation of the procedure. The left antecubital region and upper arm was prepped and draped in sterile fashion. Ultrasound confirmed a patent cephalic vein and fistula. Ultrasound images were saved for documentation. Skin was anesthetized with 1% lidocaine. Using ultrasound guidance, 21 gauge needle was directed into the cephalic vein near the anastomosis. Puncturing the vein was mildly difficult due to the tortuosity of the vessel. Vein was successfully punctured with ultrasound guidance and micropuncture dilator set was placed. A series of fistulogram were obtained. Multiple reflux images were obtained in different projections in order to evaluate the arterial anastomosis. Catheter was removed with manual compression. FINDINGS: Left brachiocephalic fistula is patent. Small collateral vessels in the mid humeral  region draining into the brachial venous system. Central veins are patent. Patient has a right jugular Port-A-Cath with the tip at the SVC and right atrium junction. Arterial anastomosis is widely patent. Approximately 4-5 cm from the arterial anastomosis, there is segment of tortuosity and narrowing in the cephalic vein. In addition, there is evidence for a small dissection  flap along the anterior aspect of the cephalic vein near the puncture site. IMPRESSION: 1. Left brachiocephalic fistula is patent. 2. Focal area of tortuosity and narrowing in the cephalic vein approximately 4-5 cm from the anastomosis. This narrowing appears to be related to the tortuosity rather than intimal hyperplasia. No intervention was performed. 3. Short segment intimal flap or non flow limiting dissection along the anterior aspect of the cephalic vein near the cannulation site. This small dissection may be iatrogenic from the procedure. ACCESS: This access remains amenable to future percutaneous interventions as clinically indicated. Electronically Signed   By: Markus Daft M.D.   On: 04/24/2019 15:13   CT HEAD CODE STROKE WO CONTRAST  Result Date: 04/25/2019 CLINICAL DATA:  Code stroke.  Aphasia. EXAM: CT HEAD WITHOUT CONTRAST TECHNIQUE: Contiguous axial images were obtained from the base of the skull through the vertex without intravenous contrast. COMPARISON:  Brain MRI 04/22/2019, head CT 04/20/2019 FINDINGS: Brain: There is no evidence of acute intracranial hemorrhage, intracranial mass, midline shift or extra-axial fluid collection.No acute demarcated cortical infarct is demonstrated. Redemonstrated chronic ischemic changes within the cerebral white matter, deep gray nuclei and pons. Stable, mild generalized parenchymal atrophy. Vascular: No hyperdense vessel.  Atherosclerotic calcifications. Skull: Normal. Negative for fracture or focal lesion. Redemonstrated small left frontal calvarial exostosis. Sinuses/Orbits: Visualized orbits demonstrate no acute abnormality. Mild ethmoid and left sphenoid sinus mucosal thickening. No significant mastoid effusion. These results were communicated to Dr. Willaim Bane 10:20 amon 4/14/2021by text page via the [facility] messaging system. IMPRESSION: No CT evidence of acute intracranial abnormality. Stable appearance of generalized parenchymal atrophy and chronic  small vessel ischemic disease. Electronically Signed   By: Kellie Simmering DO   On: 04/25/2019 10:20      Subjective: Patient seen and examined bedside, husband present.  Good sleep overnight.  Mentation seems to be at baseline.  Discussed with nephrology this morning, okay for discharge home and to follow-up with [facility] clinic starting on Tuesday for continued hemodialysis.  Patient husband instructed on IM injections for B12 administration, feels comfortable.  No other complaints or concerns at this time.  Denies headache, no fever/chills/night sweats, no nausea/vomiting/diarrhea, no chest pain, no palpitations, no shortness of breath, no abdominal pain, no weakness, no fatigue, no paresthesias.  No acute events overnight per nursing staff.  Discharge Exam: Vitals:   04/29/19 0600 04/29/19 0811  BP: (!) 131/51   Pulse:    Resp: 14 (!) 21  Temp: 98 F (36.7 C)   SpO2: 96%    Vitals:   04/28/19 1930 04/28/19 2027 04/29/19 0600 04/29/19 0811  BP:  (!) 120/52 (!) 131/51   Pulse:  75    Resp: 15 16 14  (!) 21  Temp:  98.2 F (36.8 C) 98 F (36.7 C)   TempSrc:  Oral Oral   SpO2:  96% 96%   Weight:   70.8 kg   Height:        General: Pt is alert, awake, not in acute distress Cardiovascular: RRR, S1/S2 +, no rubs, no gallops Respiratory: CTA bilaterally, no wheezing, no rhonchi Abdominal: Soft, NT, ND, bowel sounds + Extremities: no edema,  no cyanosis    The results of significant diagnostics from this hospitalization (including imaging, microbiology, ancillary and laboratory) are listed below for reference.     Microbiology: Recent Results (from the past 240 hour(s))  SARS CORONAVIRUS 2 (TAT 6-24 HRS) Nasopharyngeal Nasopharyngeal Swab     Status: None   Collection Time: 04/20/19 10:11 PM   Specimen: Nasopharyngeal Swab  Result Value Ref Range Status   SARS Coronavirus 2 NEGATIVE NEGATIVE Final    Comment: (NOTE) SARS-CoV-2 target nucleic acids are NOT DETECTED. The  SARS-CoV-2 RNA is generally detectable in upper and lower respiratory specimens during the acute phase of infection. Negative results do not preclude SARS-CoV-2 infection, do not rule out co-infections with other pathogens, and should not be used as the sole basis for treatment or other patient management decisions. Negative results must be combined with clinical observations, patient history, and epidemiological information. The expected result is Negative. Fact Sheet for Patients: SugarRoll.be Fact Sheet for Healthcare Providers: https://www.woods-mathews.com/ This test is not yet approved or cleared by the [place] FDA and  has been authorized for detection and/or diagnosis of SARS-CoV-2 by FDA under an Emergency Use Authorization (EUA). This EUA will remain  in effect (meaning this test can be used) for the duration of the COVID-19 declaration under Section 56 4(b)(1) of the Act, 21 U.S.C. section 360bbb-3(b)(1), unless the authorization is terminated or revoked sooner. Performed at [facility], [place] [address] [place], [place] [address]      Labs: BNP (last 3 results) No results for input(s): BNP in the last 8760 hours. Basic Metabolic Panel: Recent Labs  Lab 04/24/19 0404 04/25/19 0822 04/26/19 0446 04/27/19 0704 04/28/19 0509  NA 138 137 138 138 136  K 4.3 4.1 4.0 4.2 3.4*  CL 105 100 102 100 99  CO2 23 26 23 25 26   GLUCOSE 122* 188* 107* 107* 100*  BUN 50* 35* 39* 48* 18  CREATININE 4.76* 4.03* 4.86* 5.65* 3.94*  CALCIUM 9.6 9.5 9.8 9.5 9.0  MG  --   --  2.1  --  1.9  PHOS  --   --  4.8* 5.0* 4.8*   Liver Function Tests: Recent Labs  Lab 04/25/19 0822 04/27/19 0704 04/28/19 0509  AST 40  --   --   ALT 41  --   --   ALKPHOS 86  --   --   BILITOT 0.9  --   --   PROT 5.4*  --   --   ALBUMIN 3.0* 2.9* 3.0*   No results for input(s): LIPASE, AMYLASE in the last 168 hours. No results for input(s):  AMMONIA in the last 168 hours. CBC: Recent Labs  Lab 04/24/19 1420 04/25/19 0822 04/28/19 0509  WBC 5.8 7.7 8.1  HGB 10.0* 10.0* 10.0*  HCT 30.9* 30.3* 30.3*  MCV 92.0 91.0 92.4  PLT 186 205 190   Cardiac Enzymes: No results for input(s): CKTOTAL, CKMB, CKMBINDEX, TROPONINI in the last 168 hours. BNP: Invalid input(s): POCBNP CBG: Recent Labs  Lab 04/28/19 0749 04/28/19 1126 04/28/19 1731 04/28/19 2109 04/29/19 0727  GLUCAP 130* 194* 96 159* 146*   D-Dimer No results for input(s): DDIMER in the last 72 hours. Hgb A1c Recent Labs    04/27/19 0704  HGBA1C 5.6   Lipid Profile Recent Labs    04/27/19 0704  CHOL 177  HDL 33*  LDLCALC 98  TRIG 231*  CHOLHDL 5.4   Thyroid function studies No results for input(s): TSH, T4TOTAL,  T3FREE, THYROIDAB in the last 72 hours.  Invalid input(s): FREET3 Anemia work up No results for input(s): VITAMINB12, FOLATE, FERRITIN, TIBC, IRON, RETICCTPCT in the last 72 hours. Urinalysis    Component Value Date/Time   COLORURINE STRAW (A) 04/20/2019 1822   APPEARANCEUR CLEAR 04/20/2019 1822   LABSPEC 1.011 04/20/2019 1822   PHURINE 7.0 04/20/2019 1822   GLUCOSEU NEGATIVE 04/20/2019 1822   HGBUR NEGATIVE 04/20/2019 1822   BILIRUBINUR NEGATIVE 04/20/2019 1822   KETONESUR NEGATIVE 04/20/2019 1822   PROTEINUR >=300 (A) 04/20/2019 1822   UROBILINOGEN 0.2 06/10/2014 1259   NITRITE NEGATIVE 04/20/2019 1822   LEUKOCYTESUR SMALL (A) 04/20/2019 1822   Sepsis Labs Invalid input(s): PROCALCITONIN,  WBC,  LACTICIDVEN Microbiology Recent Results (from the past 240 hour(s))  SARS CORONAVIRUS 2 (TAT 6-24 HRS) Nasopharyngeal Nasopharyngeal Swab     Status: None   Collection Time: 04/20/19 10:11 PM   Specimen: Nasopharyngeal Swab  Result Value Ref Range Status   SARS Coronavirus 2 NEGATIVE NEGATIVE Final    Comment: (NOTE) SARS-CoV-2 target nucleic acids are NOT DETECTED. The SARS-CoV-2 RNA is generally detectable in upper and  lower respiratory specimens during the acute phase of infection. Negative results do not preclude SARS-CoV-2 infection, do not rule out co-infections with other pathogens, and should not be used as the sole basis for treatment or other patient management decisions. Negative results must be combined with clinical observations, patient history, and epidemiological information. The expected result is Negative. Fact Sheet for Patients: SugarRoll.be Fact Sheet for Healthcare Providers: https://www.woods-mathews.com/ This test is not yet approved or cleared by the [place] FDA and  has been authorized for detection and/or diagnosis of SARS-CoV-2 by FDA under an Emergency Use Authorization (EUA). This EUA will remain  in effect (meaning this test can be used) for the duration of the COVID-19 declaration under Section 56 4(b)(1) of the Act, 21 U.S.C. section 360bbb-3(b)(1), unless the authorization is terminated or revoked sooner. Performed at [facility], [place] [address], [place], [place] [address]      Time coordinating discharge: Over 30 minutes  SIGNED:   Denario Bagot J [place] DO  [facility] 04/29/2019, 10:52 AM

## 2019-04-29 NOTE — Discharge Instructions (Signed)
Dialysis Dialysis is a procedure that is done when the kidneys have stopped working properly (kidney failure). It may also be done earlier if it may help improve symptoms. During dialysis, wastes, salt, and extra water are removed from the blood, and the levels of certain minerals in the blood are maintained. Dialysis is done in sessions which are continued until the kidneys get better. If the kidneys cannot get better, such as in end-stage kidney disease, dialysis is continued for life or until you receive a new kidney from a donor (kidney transplant). There are two types of dialysis: hemodialysis and peritoneal dialysis. What is hemodialysis?        Hemodialysis is when a machine called a dialyzer is used to filter the blood. Before starting hemodialysis, you will have surgery to create a site where blood can be removed from the body and returned to the body (vascular access). There are three types of vascular accesses:  Arteriovenous fistula. This type of access is created when an artery and a vein (usually in the arm) are connected during surgery. The arteriovenous fistula usually takes 1-6 months to develop after surgery. It may last longer than the other types of vascular accesses and is less likely to become infected or cause blood clots.  Arteriovenous graft. This type of access is created when an artery and a vein in the arm are connected during surgery with a tube. An arteriovenous graft can usually be used within 2-3 weeks of surgery.  A venous catheter. To create this type of access, a thin tube (catheter) is placed in a large vein in your neck, chest, or groin. A venous catheter can be used right away. It is usually used as a temporary access when dialysis needs to begin immediately. During hemodialysis, blood leaves your body through your access site. It travels through a tube to the dialyzer, where it is filtered. The blood then returns to your body through another tube. Hemodialysis  is usually done at a hospital or dialysis center three times a week. Visits last about 3-5 hours. With special training, it may also be done at home with the help of another person. What is peritoneal dialysis? Peritoneal dialysis is when the thin lining of the abdomen (peritoneum) and a fluid called dialysate are used to filter the blood. Before starting peritoneal dialysis, you will have surgery to place a catheter in your abdomen. The catheter will be used to transfer dialysate to and from your abdomen. At the start of a session, your abdomen is filled with dialysate. During the session, wastes, salt, and extra water in the blood pass through the peritoneum and into the dialysate. The dialysate is drained from the body at the end of the session. The process of filling and draining the dialysate is called an exchange. Exchanges are repeated until you have used up all the dialysate for the day. You may do peritoneal dialysis at home or at almost any other location. It is done every day. You may need up to five exchanges a day. Each exchange takes about 30-40 minutes. The amount of time the dialysate is in your body between exchanges is called a dwell. The dwell usually lasts 1.5-3 hours and can vary with each person. You may choose to do exchanges at night while you sleep, using a machine called a cycler. Which type of dialysis should I choose? Both types of dialysis have advantages and disadvantages. Talk with your health care provider about which type of dialysis is best  for you. Your lifestyle, preferences, and medical condition should be considered. In some cases, only one type of dialysis can be chosen. Advantages of hemodialysis  It is done less often than peritoneal dialysis.  Someone else can do the dialysis for you.  If you go to a dialysis center: ? Your health care provider can recognize any problems you may be having. ? You can interact with others who are having dialysis. This can  provide you with emotional support. Disadvantages of hemodialysis  Hemodialysis may cause cramps and low blood pressure. It may leave you feeling tired on the days you have the treatment.  If you go to a dialysis center, you will need to make weekly appointments and work around the center's schedule.  You will need to take extra care when traveling. If you usually get treatment in a dialysis center, you will need to arrange to visit a dialysis center near your destination. If you are having treatments at home, you will need to take the dialyzer with you when traveling.  There are more eating restrictions than with peritoneal dialysis. Advantages of peritoneal dialysis  It is less likely than hemodialysis to cause cramps and low blood pressure.  There are fewer eating restrictions than with hemodialysis.  You may do exchanges on your own wherever you are, including when you travel. Disadvantages of peritoneal dialysis  It is done more often than hemodialysis.  Doing peritoneal dialysis requires you to have a good use (dexterity) of your hands. You must also be able to lift bags.  You must learn how to make your equipment free of germs (sterilization techniques). You will need to use these techniques every day to prevent infection. What changes will I need to make to my diet during dialysis? Both types of dialysis require you to make some changes to your diet. For example, you will need to limit your intake of foods that contain a lot of phosphorus and potassium. You will also need to limit your fluid intake. A diet and nutrition specialist (dietitian) can help you make a meal plan that can help improve your dialysis and your health. What should I expect when starting dialysis? Adjusting to the dialysis treatment, schedule, and diet can take some time. You may need to stop working and may not be able to do some of your normal activities. You may feel anxious or depressed when starting  dialysis. Over time, many people feel better overall because of dialysis. You may be able to return to work after making some changes, such as reducing work intensity. Where to find more information  [place]: www.kidney.org  American Association of Kidney Patients: BombTimer.gl  American Kidney Fund: www.kidneyfund.org Summary  During dialysis, wastes, salt, and extra water are removed from the blood, and the levels of certain minerals in the blood are maintained. There are two types of dialysis: hemodialysis and peritoneal dialysis.  Hemodialysis is when a machine called a dialyzer is used to filter the blood.  Hemodialysis is usually done by a health care provider at a hospital or dialysis center three times a week.  Peritoneal dialysis is when the peritoneum is used as a filter. You may do peritoneal dialysis at home or at almost any other location.  Both types of dialysis have advantages and disadvantages. Talk with your health care provider about which type of dialysis is best for you. This information is not intended to replace advice given to you by your health care provider. Make sure you  discuss any questions you have with your health care provider. Document Revised: 05/16/2018 Document Reviewed: 02/24/2016 Elsevier Patient Education  [place].

## 2019-04-29 NOTE — TOC Transition Note (Signed)
Transition of Care [facility]) - CM/SW Discharge Note Marvetta Gibbons RN, BSN Transitions of Care Unit 4E- RN Case Manager [phone] Weekend Coverage   Patient Details  Name: [name] MRN: [id number] Date of Birth: 11-22-1945  Transition of Care [facility]) CM/SW Contact:  Dawayne Zaineb, RN Phone Number: 04/29/2019, 1:49 PM   Clinical Narrative:    Pt stable for transition home today, orders placed for DME and HH, CM spoke with pt and spouse at bedside, reviewed DME and [place] orders. Per spouse- they do not need 3n1 however will take RW for home- agreeable to use [employer] inhouse provider for DME- plan to have delivered to room prior to discharge. Discussed HH- RN/PT/OT needs- list provided for [facility] choice Per CMS guidelines from medicare.gov website with star ratings (copy placed in shadow chart)- per spouse their first choice is [facility] for services and if unable to accept then will use [facility]. Address, phone # and PCP all confirmed in epic (would like spouse cell used as backup to home #). Spouse to transport home.  Call made to [facility] with [employer] for DME need- RW to be delivered to room once processed with insurance.  Call made to [facility] with [facility] for [facility] referral- unable to accept due to staffing. Call made to [facility] with [facility] Lemmings- they are able to accept referral for [facility] needs   Final next level of care: Home w Home Health Services Barriers to Discharge: No Barriers Identified   Patient Goals and CMS Choice Patient states their goals for this hospitalization and ongoing recovery are:: to be healthy, independent again, and be able to drive CMS Medicare.gov Compare Post Acute Care list provided to:: Patient Represenative (must comment)(spouse) Choice offered to / list presented to : Spouse  Discharge Placement               Home with [facility]        Discharge Plan and Services   Discharge Planning Services: CM Consult Post Acute Care Choice: Durable Medical Equipment, Home Health          DME  Arranged: 3-N-1, [place] rolling DME Agency: [employer] Date DME Agency Contacted: 04/29/19 Time DME Agency Contacted: 1206 Representative spoke with at DME Agency: [place]: RN, PT, OT [place] Agency: [place] Date [place]: 04/29/19 Time [place]: 14 Representative spoke with at [place]: [place] ([place]) Interventions     Readmission Risk Interventions Readmission Risk Prevention Plan 04/29/2019  Transportation Screening Complete  Medication Review Press photographer) Complete  PCP or Specialist appointment within 3-5 days of discharge Complete  HRI or [place] Complete  SW Recovery Care/Counseling Consult Complete  [place] Not Applicable  Some recent data might be hidden

## 2019-04-29 NOTE — Progress Notes (Signed)
Admit: 04/20/2019 LOS: 9  36F new  ESRD, AMS/Delirium  Subjective:  [name] Good night, good rest, no sun-downing . Alert and herself this AM . Discussed goiing home, next HD otupt on Tuesday, they are in agreement   No intake/output data recorded.  Filed Weights   04/27/19 1650 04/28/19 0456 04/29/19 0600  Weight: 74.8 kg 62.2 kg 70.8 kg    Scheduled Meds: . acidophilus  1 capsule Oral Daily  . aspirin EC  81 mg Oral QHS  . atorvastatin  80 mg Oral QHS  . calcitRIOL  0.25 mcg Oral Daily  . Chlorhexidine Gluconate Cloth  6 each Topical Q0600  . cyanocobalamin  1,000 mcg Intramuscular Daily  . [START ON 05/01/2019] darbepoetin (ARANESP) injection - DIALYSIS  60 mcg Intravenous Q Tue-HD  . famotidine  20 mg Oral QHS  . furosemide  20 mg Oral Once per day on Tue Fri  . gabapentin  100 mg Oral TID  . heparin  5,000 Units Subcutaneous Q8H  . insulin aspart  0-5 Units Subcutaneous QHS  . insulin aspart  0-6 Units Subcutaneous TID WC  . isosorbide mononitrate  60 mg Oral Daily  . loratadine  10 mg Oral Daily  . melatonin  3 mg Oral QHS  . metoprolol tartrate  50 mg Oral BID  . pantoprazole  40 mg Oral Daily  . QUEtiapine  12.5 mg Oral QHS  . sodium chloride flush  10-40 mL Intracatheter Q12H   Continuous Infusions: . ferric gluconate (FERRLECIT/NULECIT) IV 125 mg (04/27/19 1701)   PRN Meds:.acetaminophen **OR** acetaminophen, calcium carbonate (dosed in mg elemental calcium), camphor-menthol **AND** hydrOXYzine, docusate sodium, feeding supplement (NEPRO CARB STEADY), haloperidol lactate, labetalol, nitroGLYCERIN, ondansetron **OR** ondansetron (ZOFRAN) IV, sodium chloride flush, sorbitol  Current Labs: reviewed  Results for MARTI, MCLANE" (MRN [id number]) as of 04/28/2019 10:06  Ref. Range 04/21/2019 05:25  Saturation Ratios Latest Ref Range: 10.4 - 31.8 % 25  Ferritin Latest Ref Range: 11 - 307 ng/mL 47   Results for SHRUTI, ARREY" (MRN [id number]) as of 04/28/2019  10:06  Ref. Range 04/21/2019 05:25  PTH, Intact Latest Ref Range: 15 - 65 pg/mL 187 (H)    Physical Exam:  Blood pressure (!) 131/51, pulse 75, temperature 98 F (36.7 C), temperature source Oral, resp. rate (!) 21, height 5' (1.524 m), weight 70.8 kg, SpO2 96 %. Pleasant, calm, her usual self RRR CTAB LUE AVF +B/T, sig bruising IJ TDC C/D/I No sig LEE AAO to self, location this AM  A 1. New ESRD, started HD 1. L IJ TDC VVS for large infiltration of lUE AVF 2. LUE AVF, as above, letting infiltration resolve, readdress with expert cannulator as outpt 3. CLIP complete to [facility]SS 11:55am 4. Plan was for HHD, will see how things go from here 2. Anemia  1. Fe indices above 2. 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 Delirium / AMS; improved,  5. Deconditioning: PT/OT = HH PT/OT, supervision  P . DC to home today . Can go until Tues for next HD   Pearson Grippe MD 04/29/2019, 12:12 PM  Recent Labs  Lab 04/26/19 0446 04/27/19 0704 04/28/19 0509  NA 138 138 136  K 4.0 4.2 3.4*  CL 102 100 99  CO2 23 25 26   GLUCOSE 107* 107* 100*  BUN 39* 48* 18  CREATININE 4.86* 5.65* 3.94*  CALCIUM 9.8 9.5 9.0  PHOS 4.8* 5.0* 4.8*  Recent Labs  Lab 04/24/19 1420 04/25/19 0822 04/28/19 0509  WBC 5.8 7.7 8.1  HGB 10.0* 10.0* 10.0*  HCT 30.9* 30.3* 30.3*  MCV 92.0 91.0 92.4  PLT 186 205 190

## 2019-04-30 ENCOUNTER — Telehealth: Payer: Self-pay | Admitting: Nephrology

## 2019-04-30 DIAGNOSIS — Z87892 Personal history of anaphylaxis: Secondary | ICD-10-CM | POA: Insufficient documentation

## 2019-04-30 DIAGNOSIS — D689 Coagulation defect, unspecified: Secondary | ICD-10-CM | POA: Insufficient documentation

## 2019-04-30 DIAGNOSIS — L299 Pruritus, unspecified: Secondary | ICD-10-CM | POA: Insufficient documentation

## 2019-04-30 DIAGNOSIS — N186 End stage renal disease: Secondary | ICD-10-CM | POA: Insufficient documentation

## 2019-04-30 DIAGNOSIS — R52 Pain, unspecified: Secondary | ICD-10-CM | POA: Insufficient documentation

## 2019-04-30 LAB — GLUCOSE, CAPILLARY
Glucose-Capillary: 94 mg/dL (ref 70–99)
Glucose-Capillary: 164 mg/dL — ABNORMAL HIGH (ref 70–99)
Glucose-Capillary: 112 mg/dL — ABNORMAL HIGH (ref 70–99)

## 2019-04-30 NOTE — Progress Notes (Signed)
Late Entry: Patient has been accepted at [facility] clinic for OP HD treatment on a TTS schedule with a seat time of 11:55am. She needs to arrive for her appointments at 11:35am. On her first day at the clinic, she needs to arrive at 11:00am to complete paperwork prior to her first treatment. Nephrologist aware. Navigator will follow up with family once patient more stable.  Alphonzo Cruise, [place] Renal Navigator [phone]

## 2019-04-30 NOTE — Telephone Encounter (Signed)
Transition of Care Contact from [place]   Date of Discharge: 04/29/19 Date of Contact: 04/30/19 Method of contact: phone Talked to patient   Patient contacted to discuss transition of care form recent hospitaliztion. Patient was admitted to [facility] from 04/20/19 to 04/29/19 with the discharge diagnosis of new ESRD on HD, acute metabolic encephalopathy of unclear origin and deconditioning.   Medication changes were reviewed discussed stopping home calcitriol and lasix once outpatient HD started.    Patient will follow up with is outpatient dialysis center 05/01/19.   Other follow up needs include having home health assistance with RN/PT/OT.   Jen Mow, PA-C [facility] Pager: [phone]

## 2019-05-02 DIAGNOSIS — E441 Mild protein-calorie malnutrition: Secondary | ICD-10-CM | POA: Insufficient documentation

## 2019-05-04 LAB — METHYLMALONIC ACID, SERUM: Methylmalonic Acid, Quantitative: 461 nmol/L — ABNORMAL HIGH (ref 0–378)

## 2019-07-30 DIAGNOSIS — D509 Iron deficiency anemia, unspecified: Secondary | ICD-10-CM | POA: Insufficient documentation

## 2019-08-31 ENCOUNTER — Other Ambulatory Visit: Payer: Self-pay | Admitting: Physician Assistant

## 2019-08-31 ENCOUNTER — Ambulatory Visit
Admission: RE | Admit: 2019-08-31 | Discharge: 2019-08-31 | Disposition: A | Discharge status: Home / Self Care | Payer: Medicare PPO | Source: Ambulatory Visit | Attending: Physician Assistant | Admitting: Physician Assistant

## 2019-08-31 DIAGNOSIS — R05 Cough: Secondary | ICD-10-CM

## 2019-08-31 DIAGNOSIS — R058 Other specified cough: Secondary | ICD-10-CM

## 2019-08-31 DIAGNOSIS — R059 Cough, unspecified: Secondary | ICD-10-CM

## 2019-09-26 ENCOUNTER — Other Ambulatory Visit: Payer: Self-pay | Admitting: Urology

## 2019-09-26 ENCOUNTER — Other Ambulatory Visit (HOSPITAL_COMMUNITY): Payer: Self-pay | Admitting: Urology

## 2019-09-26 DIAGNOSIS — D49512 Neoplasm of unspecified behavior of left kidney: Secondary | ICD-10-CM

## 2019-10-05 ENCOUNTER — Ambulatory Visit (HOSPITAL_COMMUNITY): Payer: Medicare PPO

## 2019-10-05 ENCOUNTER — Encounter (HOSPITAL_COMMUNITY): Payer: Self-pay

## 2019-12-15 DIAGNOSIS — Z992 Dependence on renal dialysis: Secondary | ICD-10-CM | POA: Insufficient documentation

## 2019-12-25 ENCOUNTER — Ambulatory Visit: Payer: Medicare PPO | Admitting: Cardiology

## 2019-12-26 ENCOUNTER — Other Ambulatory Visit: Payer: Self-pay

## 2019-12-26 ENCOUNTER — Ambulatory Visit (INDEPENDENT_AMBULATORY_CARE_PROVIDER_SITE_OTHER): Payer: Medicare PPO | Admitting: Cardiology

## 2019-12-26 ENCOUNTER — Encounter: Payer: Self-pay | Admitting: Cardiology

## 2019-12-26 VITALS — BP 130/70 | HR 66 | Ht 60.0 in | Wt 145.0 lb

## 2019-12-26 DIAGNOSIS — I25708 Atherosclerosis of coronary artery bypass graft(s), unspecified, with other forms of angina pectoris: Secondary | ICD-10-CM

## 2019-12-26 DIAGNOSIS — Z951 Presence of aortocoronary bypass graft: Secondary | ICD-10-CM

## 2019-12-26 DIAGNOSIS — E1169 Type 2 diabetes mellitus with other specified complication: Secondary | ICD-10-CM | POA: Diagnosis not present

## 2019-12-26 DIAGNOSIS — I25119 Atherosclerotic heart disease of native coronary artery with unspecified angina pectoris: Secondary | ICD-10-CM | POA: Diagnosis not present

## 2019-12-26 DIAGNOSIS — E785 Hyperlipidemia, unspecified: Secondary | ICD-10-CM

## 2019-12-26 DIAGNOSIS — I10 Essential (primary) hypertension: Secondary | ICD-10-CM

## 2019-12-26 MED ORDER — NITROGLYCERIN 0.4 MG SL SUBL: 0.4000 mg | SUBLINGUAL_TABLET | SUBLINGUAL | 11 refills | Status: DC | PRN

## 2019-12-26 NOTE — Progress Notes (Signed)
Primary Care Provider: Heywood Bene, PA-C Cardiologist: No primary care provider on file. Electrophysiologist: None  Clinic Note: Chief Complaint  Patient presents with  . Follow-up  . Coronary Artery Disease    Noting angina, but is noting exertional dyspnea    Problem List Items Addressed This Visit    CAD (coronary artery disease) of artery native coronary artery Primary  (Chronic) ->S/P CABG x 3 (Chronic)   Essential hypertension (Chronic)   Hyperlipidemia LDL goal <70 - (Chronic)      HPI:    Dana Jackson is a 74 y.o. female with a PMH notable for MV-CAD-CABGx3, history of RCCA (s/p left nephrectomy) with now ESRD on HD (as of April 2021), longstanding history of Recurrent Endometrial Cancer (s/p TAH/BSO & chemo), prior CVA, DM-2 who presents today for early 1 year follow-up  Dana Jackson was last seen on January 19, 2019 for work was that time with delayed follow-up.  Note exertional dyspnea with or without exertion.  But no chest pain.  Not very active.  Intermittent fast heart rate palpitations but nothing prolonged.  Intermittent cough.  Increased Imdur to 60 mg due to hypertension and exertional dyspnea  Recent Hospitalizations:   4/9-18/2021-admitted with worsening confusion and altered mental status.  Worsening Renal Failure with HyperKalemia. Felt to have uremic delirium -> admitted from Nephrology office to initiate HD. (had matured fistula placed in July 2019).  Thought a component of dementia.  On on the morning of 04/25/2019, code stroke called because of right facial droop and right arm weakness  along with slurred speech/aphasia.  Symptoms were progressing while she was being transported to CT scan.  Negative MRI and CTA  EEG showed moderate diffuse encephalopathy but no specific etiology.  No epileptiform discharges.  Tunneled catheter placed because of degree of difficulty accessing fistula. -->  symptoms improved significantly with  dialysis  Reviewed  CV studies:    The following studies were reviewed today: (if available, images/films reviewed: From Epic Chart or Care Everywhere) . Echocardiogram 03/28/2019: EF 65 to 70%.  Mild LVH.  No R WMA.  Mild to moderate aortic valve sclerosis but no stenosis   Interval History:   Dana Jackson returns here today overall doing fairly well.  She says that hemodialysis really takes a lot out of her and that she is totally fatigued on those days, but otherwise does okay.  She has some exertional dyspnea associated with dizziness, but no chest pain or pressure doing her chores or more significant exertion that baseline activity.  She also seldom occasion she will feel her heart rate fast but not irregular.  This can occur pretty much anytime, but not associate with any particular activity..  CV Review of Symptoms (Summary): positive for - dyspnea on exertion, palpitations and Associated lightheadedness, but no near syncope negative for - chest pain, edema, loss of consciousness, orthopnea, paroxysmal nocturnal dyspnea, shortness of breath or TIA/amaurosis fugax or claudication  The patient does not have symptoms concerning for COVID-19 infection (fever, chills, cough, or new shortness of breath).   REVIEWED OF SYSTEMS   Review of Systems  Constitutional: Positive for malaise/fatigue (Significant fatigue associated with dialysis.). Negative for weight loss.  HENT: Negative for congestion and nosebleeds.   Respiratory: Negative for cough and shortness of breath (Only with exertion).   Gastrointestinal: Negative for blood in stool and melena.  Genitourinary: Negative for hematuria.  Musculoskeletal: Positive for joint pain. Negative for back pain and falls.  Neurological: Positive for  dizziness (Associated with tachycardia, and exertional dyspnea). Negative for focal weakness, weakness and headaches.  Endo/Heme/Allergies: Does not bruise/bleed easily.  Psychiatric/Behavioral:  Negative for memory loss. The patient is not nervous/anxious and does not have insomnia.    I have reviewed and (if needed) personally updated the patient's problem list, medications, allergies, past medical and surgical history, social and family history.   PAST MEDICAL HISTORY   Past Medical History:  Diagnosis Date  . Anemia   . Arthritis    hands  . Asthma    childhood  . Bacteremia due to Escherichia coli June 2015    Currently being treated with anti-biotics  . CAD in native artery 12/2010   a) Cath for exertional angina & EKG changes: 40% LM, 80% mid LAD.  95% ost cX, 80-90% PDA --> CABG X3; b) Post CABG CATH for + Myoview with basal anterior ischemia -> Ost LAD 80% (diffuse) then 100% after SP2, RI 70% (too small for PCI), Ost-prox Cx 99% & OM1 90%, OstrPDA 80% (small); Occluded SVG-rPDA.  Patent LIMA-dLAD, SVG-OM: culprit ~ p-m LAD pre-LIMA & RI - not good PCI target --> Med Rx  . CKD (chronic kidney disease) stage 3, GFR 30-59 ml/min (HCC)   . Degenerative disc disease, cervical    Degenerative disc disease, cervical [722.4]  . Diabetes mellitus without complication ([place])    Type II  . Endometrial cancer [facility]) November 2015   Treated with TAH with pelvic lymphadenectomy followed by radiation and chemotherapy  . GERD (gastroesophageal reflux disease)   . Hearing loss    bilateral - no hearing aids  . Heart murmur   . History of asthma     childhood  . History of blood transfusion   . History of pneumonia    "2-3 times"  . History of unstable angina November/December 2012   T wave inversions in inferolateral leads.  No stress test performed.  . Hyperlipidemia LDL goal <70   . Hypertension, essential, benign   . Neuropathy    hands  . Pneumonia 2018  . Renal cell carcinoma ([place]) 11/18/2008   T2aNx s/p partial left nephrectomy  . 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 Stroke [facility])    TIA- no residual   . TIA (transient ischemic attack)  1992 &  2010    PAST SURGICAL HISTORY   Past Surgical History:  Procedure Laterality Date  . ANTERIOR CERVICAL DECOMP/DISCECTOMY FUSION  10/11/2005   multi-level  . AV FISTULA PLACEMENT Left 08/03/2017   Procedure: ARTERIOVENOUS (AV) FISTULA CREATION LEFT ARM;  Surgeon: Rosetta Posner, MD;  Location: [place];  Service: Vascular;  Laterality: Left;  . CARDIAC CATHETERIZATION  12/24/10   40% left main, 80% mid LAD, 95% ostial circumflex, 80-90% PDA.  [name] CARDIAC CATHETERIZATION N/A 07/01/2014   Procedure: Left Heart Cath and Cors/Grafts Angiography;  Surgeon: Leonie Man, MD;  Location: [facility] INVASIVE CV LAB:  For Abn Nuc @ [facility]: Ost LAD 80%, mid LAD 100% after S/P 2, 70% RI (no PCI target), ost-prox Cx 99%, OM1 90%. Ost rPDA 80% (~ pre-CABG), occluded SVG-rPDA, patent LIMA-dLAD, SVG OM; potential culprit for abn Nuc scan = pLAD Dz, small RI 70% or PDA -> med Rx  . CAROTID ENDARTERECTOMY Left   . CHOLECYSTECTOMY  ~ 1971  . COLONOSCOPY    . CORONARY ARTERY BYPASS GRAFT  12/25/2010   Procedure: CORONARY ARTERY BYPASS GRAFTING (CABGX3 - LIMA-LAD, SVG RPL, SVG-Circumflex);  Surgeon:  Rexene Alberts, MD;  Location: [place];  Service: Open Heart Surgery;  Laterality: N/A;  . DILATATION & CURETTAGE/HYSTEROSCOPY WITH MYOSURE N/A 11/02/2013   Procedure: DILATATION & CURETTAGE/HYSTEROSCOPY WITH MYOSURE ABLATION;  Surgeon: Allena Katz, MD;  Location: [facility];  Service: Gynecology;  Laterality: N/A;  . DOPPLER ECHOCARDIOGRAPHY  12/08/2010   EF =>55%,MILD CONCENTRIC LEFT VENTRICULAR HYPERTROPHY  . EYE SURGERY Bilateral    Cataract  . IR DIALY SHUNT INTRO NEEDLE/INTRACATH INITIAL W/IMG LEFT Left 04/24/2019  . IR FLUORO GUIDE CV LINE LEFT  04/26/2019  . IR US GUIDE VASC ACCESS LEFT  04/24/2019  . IR US GUIDE VASC ACCESS LEFT  04/26/2019  . LEFT HEART CATHETERIZATION WITH CORONARY ANGIOGRAM N/A 12/24/2010   Procedure: LEFT HEART CATHETERIZATION WITH CORONARY ANGIOGRAM;  Surgeon: Leonie Man, MD;  Location:  [facility] CATH LAB;  Service: Cardiovascular;  Laterality: N/A;  . NM MYOCAR SINGLE W/SPECT  07/26/2007   EF 79%, LEFT VENT.FUNCTION NORMAL  . PARTIAL NEPHRECTOMY Left 11/18/2008   left partial nephrectomy for renal cell CA  . PET Myocardial Perfusion Scan  06/13/2014   At [place]: Moderate size, mild severity completely reversible defect involving the basal anterior, mid anterior and apical anterior segments consistent with ischemia. EF 65% with normal global function.  . TONSILLECTOMY     "in college"  . TOTAL ABDOMINAL HYSTERECTOMY  November 2015    At [facility]: Robotic procedure with pelvic lymphadenectomy  . TRANSTHORACIC ECHOCARDIOGRAM  06/13/2014   At [place]: EF 60-65%. Grade 1 diastolic dysfunction. Mild MR. Aortic sclerosis. Moderate pulmonary hypertension  . TUBAL LIGATION  ~ 1984  . UPPER GI ENDOSCOPY    . WISDOM TOOTH EXTRACTION     June 2016    Immunization History  Administered Date(s) Administered  . PFIZER SARS-COV-2 Vaccination 02/02/2019, 02/23/2019  . Tdap 11/22/2015    MEDICATIONS/ALLERGIES   Current Meds  Medication Sig  . Alpha-Lipoic Acid 600 MG CAPS Take 600 mg by mouth daily.  [name] aspirin 81 MG tablet Take 81 mg by mouth at bedtime.  [name] atorvastatin (LIPITOR) 80 MG tablet Take 1 tablet (80 mg total) by mouth at bedtime.  . cyanocobalamin (,VITAMIN B-12,) 1000 MCG/ML injection Inject 1 mL (1,000 mcg total) into the muscle every 30 (thirty) days for 90 doses.  . Darbepoetin Alfa (ARANESP) 60 MCG/0.3ML SOSY injection Inject 0.3 mLs (60 mcg total) into the vein every Tuesday with hemodialysis.  . famotidine (PEPCID) 20 MG tablet Take 1 tablet (20 mg total) by mouth at bedtime.  . furosemide (LASIX) 20 MG tablet Take 20 mg by mouth See admin instructions. Takes 1 tablet 2 times daily as needed on Tuesday and Friday.  . gabapentin (NEURONTIN) 100 MG capsule Take 100 mg by mouth 3 (three) times daily.   . isosorbide mononitrate (IMDUR) 60 MG 24 hr  tablet Take 1 tablet (60 mg total) by mouth daily.  [name] loratadine (CLARITIN) 10 MG tablet Take 10 mg by mouth daily.  . magnesium oxide (MAG-OX) 400 MG tablet Take 400 mg by mouth daily.  . metoprolol tartrate (LOPRESSOR) 50 MG tablet Take 1 tablet (50 mg total) by mouth 2 (two) times daily.  . Omega-3 Fatty Acids (FISH OIL) 1200 MG CAPS Take 1,200 mg by mouth in the morning and at bedtime.   [name] omeprazole (PRILOSEC) 40 MG capsule Take 40 mg by mouth daily before breakfast.  . Probiotic Product (ALIGN) 4 MG CAPS Take 1 capsule by mouth daily.  [name]  pyridOXINE (VITAMIN B-6) 100 MG tablet Take 200 mg by mouth daily.   . sevelamer carbonate (RENVELA) 800 MG tablet   . [DISCONTINUED] nitroGLYCERIN (NITROSTAT) 0.4 MG SL tablet PLACE 1 TABLET (0.4 MG TOTAL) UNDER THE TONGUE EVERY 5 (FIVE) MINUTES AS NEEDED FOR CHEST PAIN. (Patient taking differently: Place 0.4 mg under the tongue every 5 (five) minutes as needed for chest pain.)    Allergies  Allergen Reactions  . Sulfa Antibiotics Other (See Comments)    "AKI"   . Erythromycin Itching  . Pravastatin Other (See Comments)    Used 01/2011 to 03/2011 (reaction unknown)  . Simvastatin Other (See Comments)    Reaction not known  . Codeine Nausea Only and Other (See Comments)    Reaction not known, but can tolerate Hycodan  . Gemfibrozil Itching    SOCIAL HISTORY/FAMILY HISTORY   Reviewed in Epic:  Pertinent findings:  Social History   Tobacco Use  . Smoking status: Never Smoker  . Smokeless tobacco: Never Used  Vaping Use  . Vaping Use: Never used  Substance Use Topics  . Alcohol use: Not Currently    Alcohol/week: 0.0 standard drinks  . Drug use: No   Social History   Social History Narrative   She is a married Mother of 2.  -- Currently being very busy taking care of 2 foster children that are staying with her daughter.  A 70-year-old and a 35-year-old that they're trying to adopt.  She is very excited about the possibility of becoming a  Grandmother.   She does walk and getting exercise, but she is wanting to get back into more activities just because she has really been limited due to her arthritic pains. She used to do things like walking and biking, and she may try to do some biking again, or at least stationary biking.    Does not smoke, does not drink.    OBJCTIVE -PE, EKG, labs   Wt Readings from Last 3 Encounters:  12/26/19 145 lb (65.8 kg)  04/29/19 156 lb (70.8 kg)  04/10/19 200 lb 9.9 oz (91 kg)    Physical Exam: BP 130/70   Pulse 66   Ht 5' (1.524 m)   Wt 145 lb (65.8 kg)   SpO2 96%   BMI 28.32 kg/m  Physical Exam Vitals reviewed.  Constitutional:      General: She is not in acute distress.    Appearance: Normal appearance. She is normal weight. She is not ill-appearing or toxic-appearing.  HENT:     Head: Normocephalic and atraumatic.  Neck:     Vascular: No carotid bruit, hepatojugular reflux or JVD.  Cardiovascular:     Rate and Rhythm: Normal rate and regular rhythm.     Pulses: Normal pulses.     Heart sounds: Murmur (2/6 C-D SEM at RUSB) heard.  No friction rub. No gallop.      Comments: Left arm fistula with palpable thrill and bruit heard throughout. Pulmonary:     Effort: Pulmonary effort is normal. No respiratory distress.     Breath sounds: Normal breath sounds.  Chest:     Chest wall: No tenderness.  Musculoskeletal:        General: Swelling (Trivial bilateral LE) present. Normal range of motion.     Cervical back: Normal range of motion and neck supple.  Skin:    General: Skin is warm and dry.     Coloration: Skin is not pale.  Neurological:     General:  No focal deficit present.     Mental Status: She is alert and oriented to person, place, and time. Mental status is at baseline.  Psychiatric:        Mood and Affect: Mood normal.        Behavior: Behavior normal.        Thought Content: Thought content normal.        Judgment: Judgment normal.     Adult ECG Report Not  checked  Recent Labs: Reviewed.  April 27, 2019: TC 177, TG 231, HDL 33, LDL 98.  A1c 5.6.  Hgb 10.0. Cr 3.94, K+ 3.44. ->  Statin dose was increased during hospitalization  Lab Results  Component Value Date   TSH 2.028 04/26/2019    ASSESSMENT/PLAN    Problem List Items Addressed This Visit    S/P CABG x 3 (Chronic)   Relevant Orders   Lipid panel (Completed)   Comprehensive Metabolic Panel (CMET) (Completed)   Coronary artery disease involving native coronary artery of native heart with angina pectoris (HCC) - Primary (Chronic)    Mostly exertional dyspnea, not really much of any chest pain.  Known occluded vein graft as well as proximal LAD disease.  If symptoms progress, would have low threshold to consider ischemic evaluation.  We did not discuss, but would need to discuss at follow-up whether or not she should have a surveillance Myoview testing done.  Would be due.  On stable dose of aspirin, statin, beta-blocker and Imdur.  No change.      Relevant Medications   nitroGLYCERIN (NITROSTAT) 0.4 MG SL tablet   Other Relevant Orders   Lipid panel (Completed)   Comprehensive Metabolic Panel (CMET) (Completed)   Essential hypertension (Chronic)    Blood pressure looks pretty good today on current dose of metoprolol tartrate and Imdur. Has not required the addition of calcium channel blocker -BP has improved after the initiation of dialysis.      Relevant Medications   nitroGLYCERIN (NITROSTAT) 0.4 MG SL tablet   Other Relevant Orders   Lipid panel (Completed)   Comprehensive Metabolic Panel (CMET) (Completed)   Hyperlipidemia associated with type 2 diabetes mellitus ([place]) (Chronic)    Labs as of April were not under control, we will recheck labs now.  Atorvastatin dose was increased to 80 mg during hospital stay.  (Labs from 12/31/2019 -reviewed at the time of dictation, LDL now at goal)  Continue current dose of atorvastatin as long she is tolerating.      Relevant  Orders   Lipid panel (Completed)   Comprehensive Metabolic Panel (CMET) (Completed)   CAD (coronary artery disease) of artery bypass graft (Chronic)    She has known occlusion of the RCA graft.  Native right coronary was not revascularized due to extensive disease.  Exertional dyspnea seems to be stable.   I suspect, that as long as she does not get volume overloaded, she probably will be okay.  Plan: Continue aspirin and statin at current doses along with Lopressor and Imdur.      Relevant Medications   nitroGLYCERIN (NITROSTAT) 0.4 MG SL tablet   Other Relevant Orders   Lipid panel (Completed)   Comprehensive Metabolic Panel (CMET) (Completed)       COVID-19 Education: The signs and symptoms of COVID-19 were discussed with the patient and how to seek care for testing (follow up with PCP or arrange E-visit).   The importance of social distancing and COVID-19 vaccination was discussed today. The patient is practicing  social distancing & Masking.   I spent a total of 5minutes with the patient spent in direct patient consultation.  Additional time spent with chart review  / charting (studies, outside notes, etc): 10 Total Time: 35 min  Current medicines are reviewed at length with the patient today.  (+/- concerns) n/a  This visit occurred during the SARS-CoV-2 public health emergency.  Safety protocols were in place, including screening questions prior to the visit, additional usage of staff PPE, and extensive cleaning of exam room while observing appropriate contact time as indicated for disinfecting solutions.  Notice: This dictation was prepared with Dragon dictation along with smaller phrase technology. Any transcriptional errors that result from this process are unintentional and may not be corrected upon review.  Patient Instructions / Medication Changes & Studies & Tests Ordered   Patient Instructions  Medication Instructions:  Continue same medications *If you need a  refill on your cardiac medications before your next appointment, please call your pharmacy*   Lab Work: Lipid panel,cmet   Testing/Procedures: None ordered   Follow-Up: At [facility], you and your health needs are our priority.  As part of our continuing mission to provide you with exceptional heart care, we have created designated Provider Care Teams.  These Care Teams include your primary Cardiologist (physician) and Advanced Practice Providers (APPs -  Physician Assistants and Nurse Practitioners) who all work together to provide you with the care you need, when you need it.  We recommend signing up for the patient portal called "MyChart".  Sign up information is provided on this After Visit Summary.  MyChart is used to connect with patients for Virtual Visits (Telemedicine).  Patients are able to view lab/test results, encounter notes, upcoming appointments, etc.  Non-urgent messages can be sent to your provider as well.   To learn more about what you can do with MyChart, go to [email or web address].    Your next appointment:  4 months   The format for your next appointment: Office     Provider:  Dr.Perseus Westall    Lab Results  Component Value Date   CHOL 129 12/31/2019   HDL 37 (L) 12/31/2019   LDLCALC 68 12/31/2019   TRIG 138 12/31/2019   CHOLHDL 3.5 12/31/2019   Lab Results  Component Value Date   CREATININE 4.11 (H) 12/31/2019   BUN 30 (H) 12/31/2019   NA 141 12/31/2019   K 4.6 12/31/2019   CL 99 12/31/2019   CO2 31 (H) 12/31/2019    Studies Ordered:   Orders Placed This Encounter  Procedures  . Lipid panel  . Comprehensive Metabolic Panel (CMET)     Glenetta Hew, M.D., M.S. Interventional Cardiologist   Pager # [phone] Phone # [phone] [address] [place], [place] [address]   Thank you for choosing Heartcare at [facility]!!

## 2019-12-26 NOTE — Patient Instructions (Signed)
Medication Instructions:  Continue same medications *If you need a refill on your cardiac medications before your next appointment, please call your pharmacy*   Lab Work: Lipid panel,cmet   Testing/Procedures: None ordered   Follow-Up: At [facility], you and your health needs are our priority.  As part of our continuing mission to provide you with exceptional heart care, we have created designated Provider Care Teams.  These Care Teams include your primary Cardiologist (physician) and Advanced Practice Providers (APPs -  Physician Assistants and Nurse Practitioners) who all work together to provide you with the care you need, when you need it.  We recommend signing up for the patient portal called "MyChart".  Sign up information is provided on this After Visit Summary.  MyChart is used to connect with patients for Virtual Visits (Telemedicine).  Patients are able to view lab/test results, encounter notes, upcoming appointments, etc.  Non-urgent messages can be sent to your provider as well.   To learn more about what you can do with MyChart, go to [email or web address].    Your next appointment:  4 months   The format for your next appointment: Office     Provider:  Dr.Harding

## 2019-12-31 LAB — COMPREHENSIVE METABOLIC PANEL
ALT: 36 IU/L — ABNORMAL HIGH (ref 0–32)
AST: 30 IU/L (ref 0–40)
Albumin/Globulin Ratio: 2.4 — ABNORMAL HIGH (ref 1.2–2.2)
Albumin: 3.9 g/dL (ref 3.7–4.7)
Alkaline Phosphatase: 148 IU/L — ABNORMAL HIGH (ref 44–121)
BUN/Creatinine Ratio: 7 — ABNORMAL LOW (ref 12–28)
BUN: 30 mg/dL — ABNORMAL HIGH (ref 8–27)
Bilirubin Total: 0.4 mg/dL (ref 0.0–1.2)
CO2: 31 mmol/L — ABNORMAL HIGH (ref 20–29)
Calcium: 9 mg/dL (ref 8.7–10.3)
Chloride: 99 mmol/L (ref 96–106)
Creatinine, Ser: 4.11 mg/dL — ABNORMAL HIGH (ref 0.57–1.00)
GFR calc Af Amer: 12 mL/min/{1.73_m2} — ABNORMAL LOW (ref >59)
GFR calc non Af Amer: 10 mL/min/{1.73_m2} — ABNORMAL LOW (ref >59)
Globulin, Total: 1.6 g/dL (ref 1.5–4.5)
Glucose: 111 mg/dL — ABNORMAL HIGH (ref 65–99)
Potassium: 4.6 mmol/L (ref 3.5–5.2)
Sodium: 141 mmol/L (ref 134–144)
Total Protein: 5.5 g/dL — ABNORMAL LOW (ref 6.0–8.5)

## 2019-12-31 LAB — LIPID PANEL
Chol/HDL Ratio: 3.5 ratio (ref 0.0–4.4)
Cholesterol, Total: 129 mg/dL (ref 100–199)
HDL: 37 mg/dL — ABNORMAL LOW (ref >39)
LDL Chol Calc (NIH): 68 mg/dL (ref 0–99)
Triglycerides: 138 mg/dL (ref 0–149)
VLDL Cholesterol Cal: 24 mg/dL (ref 5–40)

## 2020-01-11 ENCOUNTER — Encounter: Payer: Self-pay | Admitting: Cardiology

## 2020-01-11 NOTE — Assessment & Plan Note (Signed)
Blood pressure looks pretty good today on current dose of metoprolol tartrate and Imdur. Has not required the addition of calcium channel blocker -BP has improved after the initiation of dialysis.

## 2020-01-11 NOTE — Assessment & Plan Note (Signed)
She has known occlusion of the RCA graft.  Native right coronary was not revascularized due to extensive disease.  Exertional dyspnea seems to be stable.   I suspect, that as long as she does not get volume overloaded, she probably will be okay.  Plan: Continue aspirin and statin at current doses along with Lopressor and Imdur.

## 2020-01-11 NOTE — Assessment & Plan Note (Addendum)
Mostly exertional dyspnea, not really much of any chest pain.  Known occluded vein graft as well as proximal LAD disease.  If symptoms progress, would have low threshold to consider ischemic evaluation.  We did not discuss, but would need to discuss at follow-up whether or not she should have a surveillance Myoview testing done.  Would be due.  On stable dose of aspirin, statin, beta-blocker and Imdur.  No change.

## 2020-01-11 NOTE — Assessment & Plan Note (Signed)
Labs as of April were not under control, we will recheck labs now.  Atorvastatin dose was increased to 80 mg during hospital stay.  (Labs from 12/31/2019 -reviewed at the time of dictation, LDL now at goal)  Continue current dose of atorvastatin as long she is tolerating.

## 2020-01-14 ENCOUNTER — Other Ambulatory Visit: Payer: Self-pay

## 2020-01-14 DIAGNOSIS — I25119 Atherosclerotic heart disease of native coronary artery with unspecified angina pectoris: Secondary | ICD-10-CM

## 2020-01-14 DIAGNOSIS — I10 Essential (primary) hypertension: Secondary | ICD-10-CM

## 2020-01-14 DIAGNOSIS — Z79899 Other long term (current) drug therapy: Secondary | ICD-10-CM

## 2020-02-02 ENCOUNTER — Other Ambulatory Visit: Payer: Self-pay | Admitting: Cardiology

## 2020-03-27 ENCOUNTER — Other Ambulatory Visit: Payer: Self-pay | Admitting: Cardiology

## 2020-04-02 DIAGNOSIS — N2581 Secondary hyperparathyroidism of renal origin: Secondary | ICD-10-CM | POA: Insufficient documentation

## 2020-04-26 ENCOUNTER — Emergency Department (HOSPITAL_BASED_OUTPATIENT_CLINIC_OR_DEPARTMENT_OTHER)
Admission: EM | Admit: 2020-04-26 | Discharge: 2020-04-27 | Disposition: A | Discharge status: Home / Self Care | Payer: Medicare PPO | Attending: Emergency Medicine | Admitting: Emergency Medicine

## 2020-04-26 ENCOUNTER — Emergency Department (HOSPITAL_BASED_OUTPATIENT_CLINIC_OR_DEPARTMENT_OTHER): Payer: Medicare PPO | Admitting: Radiology

## 2020-04-26 ENCOUNTER — Other Ambulatory Visit: Payer: Self-pay

## 2020-04-26 ENCOUNTER — Encounter (HOSPITAL_BASED_OUTPATIENT_CLINIC_OR_DEPARTMENT_OTHER): Payer: Self-pay

## 2020-04-26 DIAGNOSIS — Z951 Presence of aortocoronary bypass graft: Secondary | ICD-10-CM | POA: Diagnosis not present

## 2020-04-26 DIAGNOSIS — Z8542 Personal history of malignant neoplasm of other parts of uterus: Secondary | ICD-10-CM | POA: Insufficient documentation

## 2020-04-26 DIAGNOSIS — E1169 Type 2 diabetes mellitus with other specified complication: Secondary | ICD-10-CM | POA: Insufficient documentation

## 2020-04-26 DIAGNOSIS — K219 Gastro-esophageal reflux disease without esophagitis: Secondary | ICD-10-CM | POA: Insufficient documentation

## 2020-04-26 DIAGNOSIS — J45909 Unspecified asthma, uncomplicated: Secondary | ICD-10-CM | POA: Diagnosis not present

## 2020-04-26 DIAGNOSIS — R079 Chest pain, unspecified: Secondary | ICD-10-CM | POA: Diagnosis not present

## 2020-04-26 DIAGNOSIS — R109 Unspecified abdominal pain: Secondary | ICD-10-CM | POA: Diagnosis present

## 2020-04-26 DIAGNOSIS — N186 End stage renal disease: Secondary | ICD-10-CM | POA: Diagnosis not present

## 2020-04-26 DIAGNOSIS — E1140 Type 2 diabetes mellitus with diabetic neuropathy, unspecified: Secondary | ICD-10-CM | POA: Diagnosis not present

## 2020-04-26 DIAGNOSIS — Z85528 Personal history of other malignant neoplasm of kidney: Secondary | ICD-10-CM | POA: Insufficient documentation

## 2020-04-26 DIAGNOSIS — E1122 Type 2 diabetes mellitus with diabetic chronic kidney disease: Secondary | ICD-10-CM | POA: Diagnosis not present

## 2020-04-26 DIAGNOSIS — R110 Nausea: Secondary | ICD-10-CM | POA: Insufficient documentation

## 2020-04-26 DIAGNOSIS — I120 Hypertensive chronic kidney disease with stage 5 chronic kidney disease or end stage renal disease: Secondary | ICD-10-CM | POA: Diagnosis not present

## 2020-04-26 DIAGNOSIS — R1013 Epigastric pain: Secondary | ICD-10-CM | POA: Diagnosis not present

## 2020-04-26 DIAGNOSIS — I2510 Atherosclerotic heart disease of native coronary artery without angina pectoris: Secondary | ICD-10-CM | POA: Insufficient documentation

## 2020-04-26 DIAGNOSIS — R0602 Shortness of breath: Secondary | ICD-10-CM | POA: Insufficient documentation

## 2020-04-26 LAB — CBC
HCT: 33.5 % — ABNORMAL LOW (ref 36.0–46.0)
Hemoglobin: 11.2 g/dL — ABNORMAL LOW (ref 12.0–15.0)
MCH: 33.3 pg (ref 26.0–34.0)
MCHC: 33.4 g/dL (ref 30.0–36.0)
MCV: 99.7 fL (ref 80.0–100.0)
Platelets: 140 10*3/uL — ABNORMAL LOW (ref 150–400)
RBC: 3.36 MIL/uL — ABNORMAL LOW (ref 3.87–5.11)
RDW: 14.2 % (ref 11.5–15.5)
WBC: 8.9 10*3/uL (ref 4.0–10.5)
nRBC: 0 % (ref 0.0–0.2)

## 2020-04-26 MED ORDER — ONDANSETRON HCL 4 MG/2ML IJ SOLN
4.0000 mg | Freq: Once | INTRAMUSCULAR | Status: AC
  Administered 2020-04-26: 4 mg via INTRAVENOUS
  Filled 2020-04-26: qty 2

## 2020-04-26 MED ORDER — FAMOTIDINE IN NACL 20-0.9 MG/50ML-% IV SOLN
20.0000 mg | Freq: Once | INTRAVENOUS | Status: AC
  Administered 2020-04-26: 20 mg via INTRAVENOUS
  Filled 2020-04-26: qty 50

## 2020-04-26 MED ORDER — SODIUM CHLORIDE 0.9 % IV BOLUS
500.0000 mL | Freq: Once | INTRAVENOUS | Status: AC
  Administered 2020-04-26: 500 mL via INTRAVENOUS

## 2020-04-26 NOTE — ED Triage Notes (Signed)
Pt present to ED for nausea/vomiting since last night. Pt also c/o epigastric abd pain. Husband reports that patient has not been steady on her feet today which is not baseline status.   Pt is a dialysis patient and goes T/Th/S and missed dialysis today due to sx.

## 2020-04-26 NOTE — ED Provider Notes (Signed)
[facility] Emergency Department Provider Note MRN:  [id number]  Arrival date & time: 04/27/20     Chief Complaint   Nausea and Abdominal Pain   History of Present Illness   Dana Jackson is a 75 y.o. year-old female with a history of CAD, ESRD, renal cell carcinoma, presenting to the ED with chief complaint of nausea and abdominal pain.  1 to 2 days of upper abdominal pain, described as dull, moderate to severe, progressively worsening.  Associated with nausea but no vomiting.  Also experiencing mild chest pain and shortness of breath.  Denies lower abdominal pain, endorsing normal bowel movements, no burning with urination, no hematuria, does not feel any subjective fevers recently.  Has had recent cough as well.  Missed dialysis today.  Review of Systems  A complete 10 system review of systems was obtained and all systems are negative except as noted in the HPI and PMH.   Patient's Health History    Past Medical History:  Diagnosis Date  . Anemia   . Arthritis    hands  . Asthma    childhood  . Bacteremia due to Escherichia coli June 2015    Currently being treated with anti-biotics  . CAD in native artery 12/2010   a) Cath for exertional angina & EKG changes: 40% LM, 80% mid LAD.  95% ost cX, 80-90% PDA --> CABG X3; b) Post CABG CATH for + Myoview with basal anterior ischemia -> Ost LAD 80% (diffuse) then 100% after SP2, RI 70% (too small for PCI), Ost-prox Cx 99% & OM1 90%, OstrPDA 80% (small); Occluded SVG-rPDA.  Patent LIMA-dLAD, SVG-OM: culprit ~ p-m LAD pre-LIMA & RI - not good PCI target --> Med Rx  . CKD (chronic kidney disease) stage 3, GFR 30-59 ml/min (HCC)   . Degenerative disc disease, cervical    Degenerative disc disease, cervical [722.4]  . Diabetes mellitus without complication ([place])    Type II  . Endometrial cancer [facility]) November 2015   Treated with TAH with pelvic lymphadenectomy followed by radiation and chemotherapy  . GERD  (gastroesophageal reflux disease)   . Hearing loss    bilateral - no hearing aids  . Heart murmur   . History of asthma     childhood  . History of blood transfusion   . History of pneumonia    "2-3 times"  . History of unstable angina November/December 2012   T wave inversions in inferolateral leads.  No stress test performed.  . Hyperlipidemia LDL goal <70   . Hypertension, essential, benign   . Neuropathy    hands  . Pneumonia 2018  . Renal cell carcinoma ([place]) 11/18/2008   T2aNx s/p partial left nephrectomy  . 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 Stroke [facility])    TIA- no residual   . TIA (transient ischemic attack) 1992 &  2010    Past Surgical History:  Procedure Laterality Date  . ANTERIOR CERVICAL DECOMP/DISCECTOMY FUSION  10/11/2005   multi-level  . AV FISTULA PLACEMENT Left 08/03/2017   Procedure: ARTERIOVENOUS (AV) FISTULA CREATION LEFT ARM;  Surgeon: Rosetta Posner, MD;  Location: [place];  Service: Vascular;  Laterality: Left;  . CARDIAC CATHETERIZATION  12/24/10   40% left main, 80% mid LAD, 95% ostial circumflex, 80-90% PDA.  [name] CARDIAC CATHETERIZATION N/A 07/01/2014   Procedure: Left Heart Cath and Cors/Grafts Angiography;  Surgeon: Leonie Man, MD;  Location: [place] CV LAB:  For Abn Nuc @ [facility]: Ost LAD 80%, mid LAD 100% after S/P 2, 70% RI (no PCI target), ost-prox Cx 99%, OM1 90%. Ost rPDA 80% (~ pre-CABG), occluded SVG-rPDA, patent LIMA-dLAD, SVG OM; potential culprit for abn Nuc scan = pLAD Dz, small RI 70% or PDA -> med Rx  . CAROTID ENDARTERECTOMY Left   . CHOLECYSTECTOMY  ~ 1971  . COLONOSCOPY    . CORONARY ARTERY BYPASS GRAFT  12/25/2010   Procedure: CORONARY ARTERY BYPASS GRAFTING (CABGX3 - LIMA-LAD, SVG RPL, SVG-Circumflex);  Surgeon: Rexene Alberts, MD;  Location: [place];  Service: Open Heart Surgery;  Laterality: N/A;  . DILATATION & CURETTAGE/HYSTEROSCOPY WITH MYOSURE N/A 11/02/2013   Procedure: DILATATION &  CURETTAGE/HYSTEROSCOPY WITH MYOSURE ABLATION;  Surgeon: Allena Katz, MD;  Location: [place] [facility];  Service: Gynecology;  Laterality: N/A;  . DOPPLER ECHOCARDIOGRAPHY  12/08/2010   EF =>55%,MILD CONCENTRIC LEFT VENTRICULAR HYPERTROPHY  . EYE SURGERY Bilateral    Cataract  . IR DIALY SHUNT INTRO NEEDLE/INTRACATH INITIAL W/IMG LEFT Left 04/24/2019  . IR FLUORO GUIDE CV LINE LEFT  04/26/2019  . IR US GUIDE VASC ACCESS LEFT  04/24/2019  . IR US GUIDE VASC ACCESS LEFT  04/26/2019  . LEFT HEART CATHETERIZATION WITH CORONARY ANGIOGRAM N/A 12/24/2010   Procedure: LEFT HEART CATHETERIZATION WITH CORONARY ANGIOGRAM;  Surgeon: Leonie Man, MD;  Location: [facility] C[facility];  Service: Cardiovascular;  Laterality: N/A;  . NM MYOCAR SINGLE W/SPECT  07/26/2007   EF 79%, LEFT VENT.FUNCTION NORMAL  . PARTIAL NEPHRECTOMY Left 11/18/2008   left partial nephrectomy for renal cell CA  . PET Myocardial Perfusion Scan  06/13/2014   At [place]: Moderate size, mild severity completely reversible defect involving the basal anterior, mid anterior and apical anterior segments consistent with ischemia. EF 65% with normal global function.  . TONSILLECTOMY     "in college"  . TOTAL ABDOMINAL HYSTERECTOMY  November 2015    At [facility]: Robotic procedure with pelvic lymphadenectomy  . TRANSTHORACIC ECHOCARDIOGRAM  06/13/2014   At [place]: EF 60-65%. Grade 1 diastolic dysfunction. Mild MR. Aortic sclerosis. Moderate pulmonary hypertension  . TUBAL LIGATION  ~ 1984  . UPPER GI ENDOSCOPY    . WISDOM TOOTH EXTRACTION      Family History  Problem Relation Age of Onset  . AAA (abdominal aortic aneurysm) Mother   . Multiple myeloma Mother   . GER disease Mother   . Coronary artery disease Father   . Heart disease Father   . Coronary artery disease Sister   . Coronary artery disease Brother   . Heart disease Brother   . Stroke Maternal Grandmother   . Heart attack Neg Hx     Social History    Socioeconomic History  . Marital status: Married    Spouse name: [name]  . Number of children: 2  . Years of education: many  . Highest education level: Not on file  Occupational History  . 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 Smoking status: Never Smoker  . Smokeless tobacco: Never Used  Vaping Use  . Vaping Use: Never used  Substance and Sexual Activity  . Alcohol use: Not Currently    Alcohol/week: 0.0 standard drinks  . Drug use: No  . Sexual activity: Yes    Partners: Male    Birth control/protection: Post-menopausal    Comment:  husband  Other Topics Concern  . Not on file  Social History Narrative   She is a married Mother of 2.  -- Currently being very busy taking care of 2 foster children that are staying with her daughter.  A 14-year-old and a 47-year-old that they're trying to adopt.  She is very excited about the possibility of becoming a Grandmother.   She does walk and getting exercise, but she is wanting to get back into more activities just because she has really been limited due to her arthritic pains. She used to do things like walking and biking, and she may try to do some biking again, or at least stationary biking.    Does not smoke, does not drink.   Social Determinants of Health   Financial Resource Strain: Not on file  Food Insecurity: Not on file  Transportation Needs: Not on file  Physical Activity: Not on file  Stress: Not on file  Social Connections: Not on file  Intimate Partner Violence: Not on file     Physical Exam   Vitals:   04/27/20 0045 04/27/20 0132  BP: (!) 135/48 (!) 146/72  Pulse:  74  Resp: 17 20  Temp: 98.8 F (37.1 C)   SpO2: 98% 100%    CONSTITUTIONAL: Well-appearing, NAD NEURO:  Alert and oriented x 3, no focal deficits EYES:  eyes equal and reactive ENT/NECK:  no LAD, no JVD CARDIO: Regular rate, well-perfused, normal S1  and S2 PULM:  CTAB no wheezing or rhonchi GI/GU:  normal bowel sounds, non-distended, non-tender MSK/SPINE:  No gross deformities, no edema SKIN:  no rash, atraumatic PSYCH:  Appropriate speech and behavior  *Additional and/or pertinent findings included in MDM below  Diagnostic and Interventional Summary    EKG Interpretation  Date/Time: April 26, 2020 at 23: 23   Ventricular Rate:  85 PR Interval:  129 QRS Duration: 93 QT Interval:  340 QTC Calculation: 405 R Axis:     Text Interpretation: Sinus rhythm, no significant changes from prior Confirmed by Dr. Gerlene Fee at 1 AM      Labs Reviewed  CBC - Abnormal; Notable for the following components:      Result Value   RBC 3.36 (*)    Hemoglobin 11.2 (*)    HCT 33.5 (*)    Platelets 140 (*)    All other components within normal limits  COMPREHENSIVE METABOLIC PANEL - Abnormal; Notable for the following components:   Sodium 134 (*)    Glucose, Bld 149 (*)    BUN 40 (*)    Creatinine, Ser 4.32 (*)    Total Protein 5.9 (*)    Alkaline Phosphatase 134 (*)    GFR, Estimated 10 (*)    All other components within normal limits  LIPASE, BLOOD - Abnormal; Notable for the following components:   Lipase 54 (*)    All other components within normal limits  URINALYSIS, ROUTINE W REFLEX MICROSCOPIC - Abnormal; Notable for the following components:   pH 8.5 (*)    Protein, ur >300 (*)    All other components within normal limits  TROPONIN I (HIGH SENSITIVITY) - Abnormal; Notable for the following components:   Troponin I (High Sensitivity) 34 (*)    All other components within normal limits  TROPONIN I (HIGH SENSITIVITY) - Abnormal; Notable for the following components:   Troponin I (High Sensitivity) 38 (*)    All other components within normal limits    DG ABD ACUTE 2+V W  1V CHEST  Final Result      Medications  ondansetron (ZOFRAN) injection 4 mg (4 mg Intravenous Given 04/26/20 2350)  famotidine (PEPCID) IVPB 20 mg  premix (20 mg Intravenous New Bag/Given 04/26/20 2351)  sodium chloride 0.9 % bolus 500 mL (500 mLs Intravenous New Bag/Given 04/26/20 2352)     Procedures  /  Critical Care Procedures  ED Course and Medical Decision Making  I have reviewed the triage vital signs, the nursing notes, and pertinent available records from the EMR.  Listed above are laboratory and imaging tests that I personally ordered, reviewed, and interpreted and then considered in my medical decision making (see below for details).  Overall benign exam, oral temp 100.2, otherwise normal vital signs, nontoxic, abdomen is soft and nontender, some mild distention.  Differential diagnosis includes gastritis, pancreatitis, atypical presentation of ACS, pneumonia.  Doubt cholecystitis given history of cholecystectomy.  Had also considered small bowel obstruction given history of multiple abdominal surgeries, awaiting results of abdominal plain films.  Also considering complication related to remote history of cancer, will consider more advanced imaging if needed.     Troponin minimally elevated, suspect in the setting of ESRD with missed dialysis.  Repeat troponin is flat.  Patient's discomfort completely resolved after IV Pepcid and Zofran.  Abdomen on repeat exam is soft and nontender.  No indication for further testing or admission, patient appropriate for discharge with strict return precautions.  She will call her dialysis center for make up appointment.  Barth Kirks. [place]onia Small, [place] [email or web address]  Final Clinical Impressions(s) / ED Diagnoses     ICD-10-CM   1. Epigastric abdominal pain  R10.13 DG ABD ACUTE 2+V W 1V CHEST    DG ABD ACUTE 2+V W 1V CHEST    ED Discharge Orders         Ordered    sucralfate (CARAFATE) 1 g tablet  4 times daily PRN        04/27/20 0150    ondansetron (ZOFRAN ODT) 4 MG disintegrating tablet  Every 8 hours PRN        04/27/20 0150            Discharge Instructions Discussed with and Provided to Patient:     Discharge Instructions     You were evaluated in the Emergency Department and after careful evaluation, we did not find any emergent condition requiring admission or further testing in the hospital.  Your exam/testing today was overall reassuring.  Suspect symptoms are related to inflammation of the lining of the stomach, possibly due to a virus.  Recommend the Zofran as needed for nausea and the Carafate as needed for discomfort.  Recommend reaching out to your dialysis center for dialysis tomorrow morning.  Please return to the Emergency Department if you experience any worsening of your condition.  Thank you for allowing [place] to be a part of your care.        Maudie Flakes, MD 04/27/20 [phone]

## 2020-04-27 LAB — LIPASE, BLOOD: Lipase: 54 U/L — ABNORMAL HIGH (ref 11–51)

## 2020-04-27 LAB — URINALYSIS, ROUTINE W REFLEX MICROSCOPIC
Bilirubin Urine: NEGATIVE
Glucose, UA: NEGATIVE mg/dL
Hgb urine dipstick: NEGATIVE
Ketones, ur: NEGATIVE mg/dL
Leukocytes,Ua: NEGATIVE
Nitrite: NEGATIVE
Protein, ur: >300 mg/dL — AB
Specific Gravity, Urine: 1.014 (ref 1.005–1.030)
pH: 8.5 — ABNORMAL HIGH (ref 5.0–8.0)

## 2020-04-27 LAB — COMPREHENSIVE METABOLIC PANEL
ALT: 23 U/L (ref 0–44)
AST: 21 U/L (ref 15–41)
Albumin: 3.5 g/dL (ref 3.5–5.0)
Alkaline Phosphatase: 134 U/L — ABNORMAL HIGH (ref 38–126)
Anion gap: 10 (ref 5–15)
BUN: 40 mg/dL — ABNORMAL HIGH (ref 8–23)
CO2: 24 mmol/L (ref 22–32)
Calcium: 9.3 mg/dL (ref 8.9–10.3)
Chloride: 100 mmol/L (ref 98–111)
Creatinine, Ser: 4.32 mg/dL — ABNORMAL HIGH (ref 0.44–1.00)
GFR, Estimated: 10 mL/min — ABNORMAL LOW (ref >60)
Glucose, Bld: 149 mg/dL — ABNORMAL HIGH (ref 70–99)
Potassium: 4.5 mmol/L (ref 3.5–5.1)
Sodium: 134 mmol/L — ABNORMAL LOW (ref 135–145)
Total Bilirubin: 0.9 mg/dL (ref 0.3–1.2)
Total Protein: 5.9 g/dL — ABNORMAL LOW (ref 6.5–8.1)

## 2020-04-27 LAB — TROPONIN I (HIGH SENSITIVITY)
Troponin I (High Sensitivity): 34 ng/L — ABNORMAL HIGH (ref <18)
Troponin I (High Sensitivity): 38 ng/L — ABNORMAL HIGH (ref <18)

## 2020-04-27 MED ORDER — SUCRALFATE 1 G PO TABS: 1.0000 g | ORAL_TABLET | Freq: Four times a day (QID) | ORAL | 0 refills | Status: DC | PRN

## 2020-04-27 MED ORDER — ONDANSETRON 4 MG PO TBDP: 4.0000 mg | ORAL_TABLET | Freq: Three times a day (TID) | ORAL | 0 refills | Status: DC | PRN

## 2020-04-27 NOTE — Discharge Instructions (Addendum)
You were evaluated in the Emergency Department and after careful evaluation, we did not find any emergent condition requiring admission or further testing in the hospital.  Your exam/testing today was overall reassuring.  Suspect symptoms are related to inflammation of the lining of the stomach, possibly due to a virus.  Recommend the Zofran as needed for nausea and the Carafate as needed for discomfort.  Recommend reaching out to your dialysis center for dialysis tomorrow morning.  Please return to the Emergency Department if you experience any worsening of your condition.  Thank you for allowing [place] to be a part of your care.

## 2020-05-14 ENCOUNTER — Ambulatory Visit: Payer: Medicare PPO | Admitting: Cardiology

## 2020-05-14 ENCOUNTER — Other Ambulatory Visit: Payer: Self-pay

## 2020-05-14 ENCOUNTER — Encounter: Payer: Self-pay | Admitting: Cardiology

## 2020-05-14 VITALS — BP 120/54 | HR 66 | Ht 60.0 in | Wt 143.6 lb

## 2020-05-14 DIAGNOSIS — E1122 Type 2 diabetes mellitus with diabetic chronic kidney disease: Secondary | ICD-10-CM | POA: Diagnosis not present

## 2020-05-14 DIAGNOSIS — I10 Essential (primary) hypertension: Secondary | ICD-10-CM

## 2020-05-14 DIAGNOSIS — N185 Chronic kidney disease, stage 5: Secondary | ICD-10-CM

## 2020-05-14 DIAGNOSIS — I25119 Atherosclerotic heart disease of native coronary artery with unspecified angina pectoris: Secondary | ICD-10-CM

## 2020-05-14 DIAGNOSIS — Z951 Presence of aortocoronary bypass graft: Secondary | ICD-10-CM

## 2020-05-14 DIAGNOSIS — E1169 Type 2 diabetes mellitus with other specified complication: Secondary | ICD-10-CM

## 2020-05-14 DIAGNOSIS — E785 Hyperlipidemia, unspecified: Secondary | ICD-10-CM

## 2020-05-14 NOTE — Patient Instructions (Addendum)

## 2020-05-14 NOTE — Progress Notes (Signed)
Patient  Primary Care Provider: Heywood Bene, PA-C Cardiologist: None Electrophysiologist: None  Clinic Note: Chief Complaint  Patient presents with  . Follow-up    Just occasional spells of shortness of breath but otherwise no significant symptoms.  . Coronary Artery Disease    No angina or heart failure   ===================================  ASSESSMENT/PLAN   Problem List Items Addressed This Visit    S/P CABG x 3 (Chronic)    Last cardiac catheterization here was in June 2016 showing occluded graft to the PDA with severe ostial PDA disease, but only moderate mid RCA disease and therefore stable flow to the posterolateral system.  Patent LIMA to diffusely diseased LAD and patent vein graft to OM1 with moderate disease in the AV groove circumflex lesion OM 2.  Based on previous discussions, we have decided to avoid further ischemic evaluation unless she has symptoms.  The cath in 2016 was in response to an abnormal Cardiac Stress PET.      Coronary artery disease involving native coronary artery of native heart with angina pectoris ([place]) - Primary (Chronic)    No further exertional chest pain or pressure on current medications.  She is now on Lopressor 50 mg twice daily along with Imdur 60 mg daily, not requiring PRN nitroglycerin.  Plan: Continue current dose of aspirin, statin, beta-blocker and Imdur.       Essential hypertension (Chronic)    Blood pressure is pretty stable on current meds.  Really only on beta-blocker and Imdur. Because of concerns for hypotension during dialysis, we have stopped further medications.  In fact, her blood pressure became much better controlled since starting dialysis.      Hyperlipidemia associated with type 2 diabetes mellitus (HCC) (Chronic)    On high-dose high intensity statin-atorvastatin 80 mg.  Last LDL was 68.  At target, but would be reluctant to reduce dose at this point.      Type 2 diabetes mellitus with renal  manifestations, controlled (HCC) (Chronic)    I do not see that she is on a standing medication for diabetes.        ===================================  HPI:    Dana Jackson is a 75 y.o. 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  No major issues.  Noted getting dizzy and worn out/fatigued after dialysis.  Nondialysis days are pretty.  No PND orthopnea.  No chest pain or pressure during baseline activities.  Occasional fast heart rates but no irregular rhythms.   Recent Hospitalizations:   ER visit 04/27/2018 with epigastric pain along with mild chest discomfort/SOB.  Relieved with IV Pepcid and Zofran..  Reviewed  CV studies:    The following studies were reviewed today: (if available, images/films reviewed: From Epic Chart or Care Everywhere) . None:  Interval History:   Dana Jackson returns today for follow-up overall doing well from cardiac standpoint.  She says she occasionally feels little short of breath when she is out trying to keep up with her grandkids.  But she really has not had any significant exertional chest pain or dyspnea.  No PND orthopnea.  Has been tolerating dialysis pretty well.  Since being  on dialysis actually her edema and dyspnea has improved.  Other than occasionally noticing some shortness of breath spells, she is not having any major issues.  No palpitations.  CV Review of Symptoms (Summary): positive for - dyspnea on exertion, shortness of breath and Notably improved.  Just has occasional shortness of breath spells negative for - chest pain, edema, irregular heartbeat, orthopnea, palpitations, paroxysmal nocturnal dyspnea, rapid heart rate or  Lightheadedness or dizziness or syncope/near syncope, TIA/amaurosis fugax, claudication  REVIEWED OF SYSTEMS   Review of Systems  Constitutional: Negative for malaise/fatigue and weight loss.  HENT: Negative for congestion and nosebleeds.   Respiratory: Positive for shortness of breath (Occasional spells of shortness of breath is noted.). Negative for cough.   Cardiovascular: Negative for chest pain and leg swelling.  Gastrointestinal: Negative for abdominal pain, blood in stool and melena.  Genitourinary: Negative for hematuria.  Musculoskeletal: Positive for joint pain. Negative for falls and myalgias.  Neurological: Positive for dizziness (Sometimes after dialysis).  Psychiatric/Behavioral: Negative for depression and memory loss. The patient is not nervous/anxious and does not have insomnia.   -> Still notes fatigue with dialysis but otherwise not.  I have reviewed and (if needed) personally updated the patient's problem list, medications, allergies, past medical and surgical history, social and family history.   PAST MEDICAL HISTORY   Past Medical History:  Diagnosis Date  . Anemia   . Arthritis    hands  . Asthma    childhood  . Bacteremia due to Escherichia coli June 2015    Currently being treated with anti-biotics  . CAD in native artery 12/2010   a) Cath for exertional angina & EKG changes: 40% LM, 80% mid LAD.  95% ost cX, 80-90% PDA --> CABG X3; b) Post CABG CATH for + Myoview with basal anterior ischemia -> Ost LAD 80% (diffuse) then 100% after SP2, RI 70% (too small for PCI), Ost-prox Cx 99% & OM1 90%, OstrPDA 80% (small); Occluded SVG-rPDA.  Patent LIMA-dLAD, SVG-OM: culprit ~ p-m LAD pre-LIMA & RI - not good PCI target --> Med Rx  . CKD (chronic kidney disease) stage 3, GFR 30-59 ml/min (HCC)   . Degenerative disc disease, cervical    Degenerative disc disease, cervical [722.4]  . Diabetes mellitus without complication ([place])    Type II  . Endometrial cancer [facility])  November 2015   Treated with TAH with pelvic lymphadenectomy followed by radiation and chemotherapy  . GERD (gastroesophageal reflux disease)   . Hearing loss    bilateral - no hearing aids  . Heart murmur   . History of asthma     childhood  . History of blood transfusion   . History of pneumonia    "2-3 times"  . History of unstable angina November/December 2012   T wave inversions in inferolateral leads.  No stress test performed.  . Hyperlipidemia LDL goal <70   . Hypertension, essential, benign   . Neuropathy    hands  . Pneumonia 2018  . Renal cell carcinoma ([place]) 11/18/2008   T2aNx s/p partial left nephrectomy  . 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 Stroke [facility])    TIA- no residual   . TIA (transient ischemic attack) 1992 &  2010    PAST SURGICAL HISTORY   Past Surgical History:  Procedure Laterality Date  . ANTERIOR CERVICAL DECOMP/DISCECTOMY FUSION  10/11/2005   multi-level  . AV FISTULA PLACEMENT Left 08/03/2017   Procedure: ARTERIOVENOUS (  AV) FISTULA CREATION LEFT ARM;  Surgeon: Rosetta Posner, MD;  Location: [place];  Service: Vascular;  Laterality: Left;  . CARDIAC CATHETERIZATION  12/24/10   40% left main, 80% mid LAD, 95% ostial circumflex, 80-90% PDA.  [name] CARDIAC CATHETERIZATION N/A 07/01/2014   Procedure: Left Heart Cath and Cors/Grafts Angiography;  Surgeon: Leonie Man, MD;  Location: [facility] INVASIVE CV LAB:  For Abn Nuc @ [facility]: Ost LAD 80%, mid LAD 100% after S/P 2, 70% RI (no PCI target), ost-prox Cx 99%, OM1 90%. Ost rPDA 80% (~ pre-CABG), occluded SVG-rPDA, patent LIMA-dLAD, SVG OM; potential culprit for abn Nuc scan = pLAD Dz, small RI 70% or PDA -> med Rx  . CAROTID ENDARTERECTOMY Left   . CHOLECYSTECTOMY  ~ 1971  . COLONOSCOPY    . CORONARY ARTERY BYPASS GRAFT  12/25/2010   Procedure: CORONARY ARTERY BYPASS GRAFTING (CABGX3 - LIMA-LAD, SVG RPL, SVG-Circumflex);  Surgeon: Rexene Alberts, MD;  Location: [place];  Service: Open  Heart Surgery;  Laterality: N/A;  . DILATATION & CURETTAGE/HYSTEROSCOPY WITH MYOSURE N/A 11/02/2013   Procedure: DILATATION & CURETTAGE/HYSTEROSCOPY WITH MYOSURE ABLATION;  Surgeon: Allena Katz, MD;  Location: [facility];  Service: Gynecology;  Laterality: N/A;  . DOPPLER ECHOCARDIOGRAPHY  12/08/2010   EF =>55%,MILD CONCENTRIC LEFT VENTRICULAR HYPERTROPHY  . EYE SURGERY Bilateral    Cataract  . IR DIALY SHUNT INTRO NEEDLE/INTRACATH INITIAL W/IMG LEFT Left 04/24/2019  . IR FLUORO GUIDE CV LINE LEFT  04/26/2019  . IR US GUIDE VASC ACCESS LEFT  04/24/2019  . IR US GUIDE VASC ACCESS LEFT  04/26/2019  . LEFT HEART CATHETERIZATION WITH CORONARY ANGIOGRAM N/A 12/24/2010   Procedure: LEFT HEART CATHETERIZATION WITH CORONARY ANGIOGRAM;  Surgeon: Leonie Man, MD;  Location: [facility] CATH LAB;  Service: Cardiovascular;  Laterality: N/A;  . NM MYOCAR SINGLE W/SPECT  07/26/2007   EF 79%, LEFT VENT.FUNCTION NORMAL  . PARTIAL NEPHRECTOMY Left 11/18/2008   left partial nephrectomy for renal cell CA  . PET Myocardial Perfusion Scan  06/13/2014   At [place]: Moderate size, mild severity completely reversible defect involving the basal anterior, mid anterior and apical anterior segments consistent with ischemia. EF 65% with normal global function.  . TONSILLECTOMY     "in college"  . TOTAL ABDOMINAL HYSTERECTOMY  November 2015    At [facility]: Robotic procedure with pelvic lymphadenectomy  . TRANSTHORACIC ECHOCARDIOGRAM  06/13/2014   At [place]: EF 60-65%. Grade 1 diastolic dysfunction. Mild MR. Aortic sclerosis. Moderate pulmonary hypertension  . TUBAL LIGATION  ~ 1984  . UPPER GI ENDOSCOPY    . WISDOM TOOTH EXTRACTION     June 2016   Immunization History  Administered Date(s) Administered  . PFIZER(Purple Top)SARS-COV-2 Vaccination 02/02/2019, 02/23/2019, 12/21/2019  . Tdap 11/22/2015    MEDICATIONS/ALLERGIES   Current Meds  Medication Sig  . Alpha-Lipoic Acid 600 MG CAPS  Take 600 mg by mouth daily.  [name] aspirin 81 MG tablet Take 81 mg by mouth at bedtime.  [name] atorvastatin (LIPITOR) 80 MG tablet Take 1 tablet (80 mg total) by mouth at bedtime.  . cinacalcet (SENSIPAR) 30 MG tablet TAKE 1 TABLET BY MOUTH THREE  DAYS (TIMES) A WEEK  AFTER DIALYSIS  . cyanocobalamin (,VITAMIN B-12,) 1000 MCG/ML injection Inject 1 mL (1,000 mcg total) into the muscle every 30 (thirty) days for 90 doses.  . Darbepoetin Alfa (ARANESP) 60 MCG/0.3ML SOSY injection Inject 0.3 mLs (60 mcg total) into the vein every Tuesday  with hemodialysis.  . famotidine (PEPCID) 20 MG tablet Take 1 tablet (20 mg total) by mouth at bedtime.  . furosemide (LASIX) 20 MG tablet Take 20 mg by mouth See admin instructions. Takes 1 tablet 2 times daily as needed on Tuesday and Friday.  . gabapentin (NEURONTIN) 100 MG capsule Take 100 mg by mouth 3 (three) times daily.   [name] HYDROcodone-homatropine (HYCODAN) 5-1.5 MG/5ML syrup Take 5 ml by mouth  as needed  . isosorbide mononitrate (IMDUR) 60 MG 24 hr tablet TAKE 1 TABLET BY MOUTH EVERY DAY  . lidocaine-prilocaine (EMLA) cream APPLY SMALL AMOUNT TO ACCESS SITE (AVF) 3 TIMES A WEEK 1 HOUR BEFORE DIALYSIS. COVER WITH OCCLUSIVE DRESSING (SARAN WRAP)  . loperamide (IMODIUM A-D) 2 MG tablet Take by mouth.  . loratadine (CLARITIN) 10 MG tablet Take 10 mg by mouth daily.  . magnesium oxide (MAG-OX) 400 MG tablet Take 400 mg by mouth daily.  . Methoxy PEG-Epoetin Beta (MIRCERA IJ) Mircera  . metoprolol tartrate (LOPRESSOR) 50 MG tablet TAKE 1 TABLET BY MOUTH TWICE A DAY  . nitroGLYCERIN (NITROSTAT) 0.4 MG SL tablet Place 1 tablet (0.4 mg total) under the tongue every 5 (five) minutes as needed for chest pain.  . Omega-3 Fatty Acids (FISH OIL) 1200 MG CAPS Take 1,200 mg by mouth in the morning and at bedtime.   [name] omeprazole (PRILOSEC) 40 MG capsule Take 40 mg by mouth daily before breakfast.  . ondansetron (ZOFRAN ODT) 4 MG disintegrating tablet Take 1 tablet (4 mg total) by  mouth every 8 (eight) hours as needed for nausea or vomiting.  . Probiotic Product (ALIGN) 4 MG CAPS Take 1 capsule by mouth daily.  [name] pyridOXINE (VITAMIN B-6) 100 MG tablet Take 200 mg by mouth daily.   . sevelamer carbonate (RENVELA) 800 MG tablet   . sucralfate (CARAFATE) 1 g tablet Take 1 tablet (1 g total) by mouth 4 (four) times daily as needed.    Allergies  Allergen Reactions  . Sulfa Antibiotics Other (See Comments)    "AKI"   . Erythromycin Itching  . Pravastatin Other (See Comments)    Used 01/2011 to 03/2011 (reaction unknown)  . Simvastatin Other (See Comments)    Reaction not known  . Codeine Nausea Only and Other (See Comments)    Reaction not known, but can tolerate Hycodan  . Gemfibrozil Itching    SOCIAL HISTORY/FAMILY HISTORY   Reviewed in Epic:  Pertinent findings:  Social History   Tobacco Use  . Smoking status: Never Smoker  . Smokeless tobacco: Never Used  Vaping Use  . Vaping Use: Never used  Substance Use Topics  . Alcohol use: Not Currently    Alcohol/week: 0.0 standard drinks  . Drug use: No   Social History   Social History Narrative   She is a married Mother of 2.  -- Currently being very busy taking care of 2 foster children that are staying with her daughter.  A 55-year-old and a 20-year-old that they're trying to adopt.  She is very excited about the possibility of becoming a Grandmother.   She does walk and getting exercise, but she is wanting to get back into more activities just because she has really been limited due to her arthritic pains. She used to do things like walking and biking, and she may try to do some biking again, or at least stationary biking.    Does not smoke, does not drink.    OBJCTIVE -PE, EKG, labs  Wt Readings from Last 3 Encounters:  05/14/20 143 lb 9.6 oz (65.1 kg)  04/26/20 142 lb 3.2 oz (64.5 kg)  12/26/19 145 lb (65.8 kg)    Physical Exam: BP (!) 120/54 (BP Location: Left Arm, Patient Position: Sitting,  Cuff Size: Normal)   Pulse 66   Ht 5' (1.524 m)   Wt 143 lb 9.6 oz (65.1 kg)   SpO2 94%   BMI 28.04 kg/m  Physical Exam Constitutional:      General: She is not in acute distress.    Appearance: Normal appearance. She is normal weight. She is not ill-appearing or toxic-appearing.  HENT:     Head: Normocephalic and atraumatic.  Neck:     Vascular: No carotid bruit (Not a carotid bruit, fistula related bruit-more prominent on left than right).  Cardiovascular:     Rate and Rhythm: Normal rate and regular rhythm.     Pulses: Normal pulses.     Heart sounds: Murmur (2/6 SEM at RUSB.;  Also difficult to hear cardiac sounds because of diffuse bruit from fistula.) heard.  No friction rub. No gallop.      Comments:  Left arm fistula/palpable thrill. Pulmonary:     Effort: Pulmonary effort is normal. No respiratory distress.     Breath sounds: Normal breath sounds. No wheezing, rhonchi or rales.  Chest:     Chest wall: No tenderness.  Musculoskeletal:        General: No swelling. Normal range of motion.     Cervical back: Normal range of motion and neck supple.  Skin:    General: Skin is warm and dry.  Neurological:     General: No focal deficit present.     Mental Status: She is alert and oriented to person, place, and time.     Motor: No weakness.  Psychiatric:        Mood and Affect: Mood normal.        Behavior: Behavior normal.        Thought Content: Thought content normal.        Judgment: Judgment normal.     Adult ECG Report Not checked  Recent Labs: Reviewed Lab Results  Component Value Date   CHOL 129 12/31/2019   HDL 37 (L) 12/31/2019   LDLCALC 68 12/31/2019   TRIG 138 12/31/2019   CHOLHDL 3.5 12/31/2019   Lab Results  Component Value Date   CREATININE 4.32 (H) 04/26/2020   BUN 40 (H) 04/26/2020   NA 134 (L) 04/26/2020   K 4.5 04/26/2020   CL 100 04/26/2020   CO2 24 04/26/2020   CBC Latest Ref Rng & Units 04/26/2020 04/28/2019 04/25/2019  WBC 4.0 - 10.5  K/uL 8.9 8.1 7.7  Hemoglobin 12.0 - 15.0 g/dL 11.2(L) 10.0(L) 10.0(L)  Hematocrit 36.0 - 46.0 % 33.5(L) 30.3(L) 30.3(L)  Platelets 150 - 400 K/uL 140(L) 190 205    Lab Results  Component Value Date   TSH 2.028 04/26/2019    ==================================================  COVID-19 Education: The signs and symptoms of COVID-19 were discussed with the patient and how to seek care for testing (follow up with PCP or arrange E-visit).    I spent a total of 46minutes with the patient spent in direct patient consultation.  We talked a lot about her symptoms of dyspnea, not associated with any activity, simply occur off and on.  She had some other social issues that she wanted to discuss as well. Additional time spent with chart review  / charting (studies,  outside notes, etc): 14 min Total Time: 46 min  Current medicines are reviewed at length with the patient today.  (+/- concerns) N/A  This visit occurred during the SARS-CoV-2 public health emergency.  Safety protocols were in place, including screening questions prior to the visit, additional usage of staff PPE, and extensive cleaning of exam room while observing appropriate contact time as indicated for disinfecting solutions.  Notice: This dictation was prepared with Dragon dictation along with smaller phrase technology. Any transcriptional errors that result from this process are unintentional and may not be corrected upon review.  Patient Instructions / Medication Changes & Studies & Tests Ordered   Patient Instructions  Medication Instructions:   No changes  *If you need a refill on your cardiac medications before your next appointment, please call your pharmacy*   Lab Work: Not needed   Testing/Procedures: Not needed   Follow-Up: At [facility], you and your health needs are our priority.  As part of our continuing mission to provide you with exceptional heart care, we have created designated Provider Care Teams.   These Care Teams include your primary Cardiologist (physician) and Advanced Practice Providers (APPs -  Physician Assistants and Nurse Practitioners) who all work together to provide you with the care you need, when you need it.     Your next appointment:   12 month(s)  The format for your next appointment:   In Person  Provider:   Glenetta Hew, MD   Studies Ordered:   No orders of the defined types were placed in this encounter.    Glenetta Hew, M.D., M.S. Interventional Cardiologist   Pager # [phone] Phone # [phone] [address] [place], [place] [address]   Thank you for choosing [facility] at [facility]!!

## 2020-06-08 ENCOUNTER — Encounter: Payer: Self-pay | Admitting: Cardiology

## 2020-06-08 NOTE — Assessment & Plan Note (Signed)
I do not see that she is on a standing medication for diabetes.

## 2020-06-08 NOTE — Assessment & Plan Note (Addendum)
Blood pressure is pretty stable on current meds.  Really only on beta-blocker and Imdur. Because of concerns for hypotension during dialysis, we have stopped further medications.  In fact, her blood pressure became much better controlled since starting dialysis.

## 2020-06-08 NOTE — Assessment & Plan Note (Signed)
No further exertional chest pain or pressure on current medications.  She is now on Lopressor 50 mg twice daily along with Imdur 60 mg daily, not requiring PRN nitroglycerin.  Plan: Continue current dose of aspirin, statin, beta-blocker and Imdur.

## 2020-06-08 NOTE — Assessment & Plan Note (Signed)
Last cardiac catheterization here was in June 2016 showing occluded graft to the PDA with severe ostial PDA disease, but only moderate mid RCA disease and therefore stable flow to the posterolateral system.  Patent LIMA to diffusely diseased LAD and patent vein graft to OM1 with moderate disease in the AV groove circumflex lesion OM 2.  Based on previous discussions, we have decided to avoid further ischemic evaluation unless she has symptoms.  The cath in 2016 was in response to an abnormal Cardiac Stress PET.

## 2020-06-08 NOTE — Assessment & Plan Note (Signed)
On high-dose high intensity statin-atorvastatin 80 mg.  Last LDL was 68.  At target, but would be reluctant to reduce dose at this point.

## 2020-08-14 ENCOUNTER — Other Ambulatory Visit: Payer: Self-pay | Admitting: Physician Assistant

## 2020-09-08 DIAGNOSIS — E538 Deficiency of other specified B group vitamins: Secondary | ICD-10-CM | POA: Insufficient documentation

## 2020-09-10 DIAGNOSIS — N390 Urinary tract infection, site not specified: Secondary | ICD-10-CM | POA: Insufficient documentation

## 2020-10-03 DIAGNOSIS — U071 COVID-19: Secondary | ICD-10-CM | POA: Insufficient documentation

## 2020-11-12 ENCOUNTER — Other Ambulatory Visit: Payer: Self-pay | Admitting: Physician Assistant

## 2020-11-12 DIAGNOSIS — R053 Chronic cough: Secondary | ICD-10-CM

## 2020-11-13 ENCOUNTER — Other Ambulatory Visit: Payer: Self-pay | Admitting: Physician Assistant

## 2020-11-13 DIAGNOSIS — R0609 Other forms of dyspnea: Secondary | ICD-10-CM

## 2020-11-13 DIAGNOSIS — R053 Chronic cough: Secondary | ICD-10-CM

## 2020-11-26 ENCOUNTER — Ambulatory Visit
Admission: RE | Admit: 2020-11-26 | Discharge: 2020-11-26 | Disposition: A | Discharge status: Home / Self Care | Payer: Medicare PPO | Source: Ambulatory Visit | Attending: Physician Assistant | Admitting: Physician Assistant

## 2020-11-26 DIAGNOSIS — R053 Chronic cough: Secondary | ICD-10-CM

## 2020-11-26 DIAGNOSIS — R0609 Other forms of dyspnea: Secondary | ICD-10-CM

## 2020-12-12 ENCOUNTER — Telehealth: Payer: Self-pay | Admitting: Pulmonary Disease

## 2020-12-12 ENCOUNTER — Ambulatory Visit (INDEPENDENT_AMBULATORY_CARE_PROVIDER_SITE_OTHER): Payer: Medicare PPO | Admitting: Pulmonary Disease

## 2020-12-12 ENCOUNTER — Other Ambulatory Visit: Payer: Self-pay

## 2020-12-12 ENCOUNTER — Encounter: Payer: Self-pay | Admitting: Pulmonary Disease

## 2020-12-12 VITALS — BP 122/56 | HR 71 | Temp 97.7°F | Ht 60.0 in | Wt 144.0 lb

## 2020-12-12 DIAGNOSIS — R918 Other nonspecific abnormal finding of lung field: Secondary | ICD-10-CM | POA: Diagnosis not present

## 2020-12-12 DIAGNOSIS — Z789 Other specified health status: Secondary | ICD-10-CM | POA: Diagnosis not present

## 2020-12-12 DIAGNOSIS — Z8542 Personal history of malignant neoplasm of other parts of uterus: Secondary | ICD-10-CM | POA: Diagnosis not present

## 2020-12-12 DIAGNOSIS — Z85528 Personal history of other malignant neoplasm of kidney: Secondary | ICD-10-CM | POA: Diagnosis not present

## 2020-12-12 NOTE — Progress Notes (Addendum)
Synopsis: Referred in December 2022 for lung nodules by Heywood Bene, *  Subjective:   PATIENT ID: Dana Jackson GENDER: female DOB: [birth year], MRN: [id number]  Chief Complaint  Patient presents with   Consult    Patient says she has been having trouble with a cough. She says the cough has been here for about 6 months    PMH Renal cell carcinoma in 2010 s/p nephrectomy, endometrial cancer w/ hysterectomy, chemo and radiation. Now undersurveilance imaging. Patient is a life long non-smoker.  Patient had a CT scan of the chest that was completed on11/16/2022.  This CT scan of the chest has a new 5.4 mm left upper lobe perihilar nodule on the anterior portion a 3 mm nodule in the left lower lobe and a pre-existing left lower lobe nodule that is now larger and a 6 mm right middle lobe nodule.  There is a small single right paratracheal lymph node that is larger than previous.  These images due to the patient's history of renal cell carcinoma was concerning for metastatic disease and recurrence.  Patient's surgical oncologist was contacted from [facility] by primary care.  They spoke with Dr. Clarene Essex.  He recommended a biopsy of the largest lesion.  Patient was discussed with [facility] radiology from interventional and recommended referral for evaluation of bronchoscopic tissue biopsy.  Patient here today to discuss next steps.   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  95% ost cX, 80-90% PDA --> CABG X3; b) Post CABG CATH for + Myoview with basal anterior ischemia -> Ost LAD 80% (diffuse) then 100% after SP2, RI 70% (too small for PCI), Ost-prox Cx 99% & OM1 90%, OstrPDA 80% (small); Occluded SVG-rPDA.  Patent LIMA-dLAD, SVG-OM: culprit ~  p-m LAD pre-LIMA & RI - not good PCI target --> Med Rx   CKD (chronic kidney disease) stage 3, GFR 30-59 ml/min (HCC)    Degenerative disc disease, cervical    Degenerative disc disease, cervical [722.4]   Diabetes mellitus without complication (HCC)    Type II   Endometrial cancer [facility]) November 2015   Treated with TAH with pelvic lymphadenectomy followed by radiation and chemotherapy   GERD (gastroesophageal reflux disease)    Hearing loss    bilateral - no hearing aids   Heart murmur    History of asthma     childhood   History of blood transfusion    History of pneumonia    "2-3 times"   History of unstable angina November/December 2012   T wave inversions in inferolateral leads.  No stress test performed.   Hyperlipidemia LDL goal <70    Hypertension, essential, benign    Neuropathy    hands   Pneumonia 2018   Renal cell carcinoma ([place]) 11/18/2008   T2aNx s/p partial left nephrectomy   S/P CABG x 14 December 2010   LIMA-LAD, SVG RPL, SVG-Circumflex   Stroke [facility])    TIA- no residual    TIA (transient ischemic attack) 1992 &  2010     Family History  Problem Relation Age of Onset   AAA (abdominal aortic aneurysm) Mother    Multiple myeloma Mother  GER disease Mother    Coronary artery disease Father    Heart disease Father    Coronary artery disease Sister    Coronary artery disease Brother    Heart disease Brother    Stroke Maternal Grandmother    Heart attack Neg Hx      Past Surgical History:  Procedure Laterality Date   ANTERIOR CERVICAL DECOMP/DISCECTOMY FUSION  10/11/2005   multi-level   AV FISTULA PLACEMENT Left 08/03/2017   Procedure: ARTERIOVENOUS (AV) FISTULA CREATION LEFT ARM;  Surgeon: Rosetta Posner, MD;  Location: [facility] OR;  Service: Vascular;  Laterality: Left;   CARDIAC CATHETERIZATION  12/24/10   40% left main, 80% mid LAD, 95% ostial circumflex, 80-90% PDA.   CARDIAC CATHETERIZATION N/A 07/01/2014   Procedure: Left Heart Cath and Cors/Grafts  Angiography;  Surgeon: Leonie Man, MD;  Location: [facility] INVASIVE CV LAB:  For Abn Nuc @ [facility]: Ost LAD 80%, mid LAD 100% after S/P 2, 70% RI (no PCI target), ost-prox Cx 99%, OM1 90%. Ost rPDA 80% (~ pre-CABG), occluded SVG-rPDA, patent LIMA-dLAD, SVG OM; potential culprit for abn Nuc scan = pLAD Dz, small RI 70% or PDA -> med Rx   CAROTID ENDARTERECTOMY Left    CHOLECYSTECTOMY  ~ [place] GRAFT  12/25/2010   Procedure: CORONARY ARTERY BYPASS GRAFTING (CABGX3 - LIMA-LAD, SVG RPL, SVG-Circumflex);  Surgeon: Rexene Alberts, MD;  Location: [facility];  Service: Open Heart Surgery;  Laterality: N/A;   DILATATION & CURETTAGE/HYSTEROSCOPY WITH MYOSURE N/A 11/02/2013   Procedure: DILATATION & CURETTAGE/HYSTEROSCOPY WITH MYOSURE ABLATION;  Surgeon: Allena Katz, MD;  Location: [place] [facility];  Service: Gynecology;  Laterality: N/A;   DOPPLER ECHOCARDIOGRAPHY  12/08/2010   EF =>55%,MILD CONCENTRIC LEFT VENTRICULAR HYPERTROPHY   EYE SURGERY Bilateral    Cataract   IR DIALY SHUNT INTRO NEEDLE/INTRACATH INITIAL W/IMG LEFT Left 04/24/2019   IR FLUORO GUIDE CV LINE LEFT  04/26/2019   IR US GUIDE VASC ACCESS LEFT  04/24/2019   IR US GUIDE VASC ACCESS LEFT  04/26/2019   LEFT HEART CATHETERIZATION WITH CORONARY ANGIOGRAM N/A 12/24/2010   Procedure: LEFT HEART CATHETERIZATION WITH CORONARY ANGIOGRAM;  Surgeon: Leonie Man, MD;  Location: [facility] CATH LAB;  Service: Cardiovascular;  Laterality: N/A;   NM MYOCAR SINGLE W/SPECT  07/26/2007   EF 79%, LEFT VENT.FUNCTION NORMAL   PARTIAL NEPHRECTOMY Left 11/18/2008   left partial nephrectomy for renal cell CA   PET Myocardial Perfusion Scan  06/13/2014   At [facility]: Moderate size, mild severity completely reversible defect involving the basal anterior, mid anterior and apical anterior segments consistent with ischemia. EF 65% with normal global function.   TONSILLECTOMY     "in college"   TOTAL ABDOMINAL HYSTERECTOMY  November 2015     At [facility]: Robotic procedure with pelvic lymphadenectomy   TRANSTHORACIC ECHOCARDIOGRAM  06/13/2014   At [place]: EF 60-65%. Grade 1 diastolic dysfunction. Mild MR. Aortic sclerosis. 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  -- Currently being very busy taking care of 2 foster children that are staying with her daughter.  A 71-year-old and a 59-year-old that they're trying to adopt.  She is very excited about the possibility of becoming a Grandmother.   She does walk and getting exercise, but she is wanting to get back into more activities just because she has really been limited due to her arthritic pains. She used to do things like walking and biking, and she may try to do some biking again, or at least stationary biking.    Does not smoke, does not drink.   Social Determinants of Health   Financial Resource Strain: Not on file  Food Insecurity: Not on file  Transportation Needs: Not on file  Physical Activity: Not on file  Stress: Not on file  Social Connections: Not on file  Intimate Partner Violence: Not on file     Allergies  Allergen Reactions   Sulfa  Antibiotics Other (See Comments)    "AKI"    Erythromycin Itching   Pravastatin Other (See Comments)    Used 01/2011 to 03/2011 (reaction unknown)   Simvastatin Other (See Comments)    Reaction not known   Codeine Nausea Only and Other (See Comments)    Reaction not known, but can tolerate Hycodan   Gemfibrozil Itching     Outpatient Medications Prior to Visit  Medication Sig Dispense Refill   Alpha-Lipoic Acid 600 MG CAPS Take 600 mg by mouth daily.     aspirin 81 MG tablet Take 81 mg by mouth at bedtime.     cinacalcet (SENSIPAR) 30 MG tablet TAKE 1 TABLET BY MOUTH THREE  DAYS (TIMES) A WEEK  AFTER DIALYSIS     cyanocobalamin (,VITAMIN B-12,) 1000 MCG/ML injection Inject 1 mL (1,000 mcg total) into the muscle every 30 (thirty) days for 90 doses. 3 mL 29   Darbepoetin Alfa (ARANESP) 60 MCG/0.3ML SOSY injection Inject 0.3 mLs (60 mcg total) into the vein every Tuesday with hemodialysis. 4.2 mL    furosemide (LASIX) 20 MG tablet Take 20 mg by mouth See admin instructions. Takes 1 tablet 2 times daily as needed on Tuesday and Friday.     gabapentin (NEURONTIN) 100 MG capsule Take 100 mg by mouth 3 (three) times daily.      HYDROcodone-homatropine (HYCODAN) 5-1.5 MG/5ML syrup Take 5 ml by mouth  as needed     isosorbide mononitrate (IMDUR) 60 MG 24 hr tablet TAKE 1 TABLET BY MOUTH EVERY DAY 90 tablet 3   lidocaine-prilocaine (EMLA) cream APPLY SMALL AMOUNT TO ACCESS SITE (AVF) 3 TIMES A WEEK 1 HOUR BEFORE DIALYSIS. COVER WITH OCCLUSIVE DRESSING (SARAN WRAP)     loperamide (IMODIUM A-D) 2 MG tablet Take by mouth.     loratadine (CLARITIN) 10 MG tablet Take 10 mg by mouth daily.     magnesium oxide (MAG-OX) 400 MG tablet Take 400 mg by mouth daily.  Methoxy PEG-Epoetin Beta (MIRCERA IJ) Mircera     metoprolol tartrate (LOPRESSOR) 50 MG tablet TAKE 1 TABLET BY MOUTH TWICE A DAY 180 tablet 2   nitroGLYCERIN (NITROSTAT) 0.4 MG SL tablet Place 1 tablet (0.4 mg total) under the tongue every 5  (five) minutes as needed for chest pain. 25 tablet 11   Omega-3 Fatty Acids (FISH OIL) 1200 MG CAPS Take 1,200 mg by mouth in the morning and at bedtime.      omeprazole (PRILOSEC) 40 MG capsule Take 40 mg by mouth daily before breakfast.     ondansetron (ZOFRAN ODT) 4 MG disintegrating tablet Take 1 tablet (4 mg total) by mouth every 8 (eight) hours as needed for nausea or vomiting. 20 tablet 0   Probiotic Product (ALIGN) 4 MG CAPS Take 1 capsule by mouth daily.     pyridOXINE (VITAMIN B-6) 100 MG tablet Take 200 mg by mouth daily.      sevelamer carbonate (RENVELA) 800 MG tablet      sucralfate (CARAFATE) 1 g tablet Take 1 tablet (1 g total) by mouth 4 (four) times daily as needed. 30 tablet 0   atorvastatin (LIPITOR) 80 MG tablet Take 1 tablet (80 mg total) by mouth at bedtime. 90 tablet 0   famotidine (PEPCID) 20 MG tablet Take 1 tablet (20 mg total) by mouth at bedtime. 30 tablet 2   No facility-administered medications prior to visit.    Review of Systems  Constitutional:  Negative for chills, fever, malaise/fatigue and weight loss.  HENT:  Negative for hearing loss, sore throat and tinnitus.   Eyes:  Negative for blurred vision and double vision.  Respiratory:  Negative for cough, hemoptysis, sputum production, shortness of breath, wheezing and stridor.   Cardiovascular:  Negative for chest pain, palpitations, orthopnea, leg swelling and PND.  Gastrointestinal:  Negative for abdominal pain, constipation, diarrhea, heartburn, nausea and vomiting.  Genitourinary:  Negative for dysuria, hematuria and urgency.  Musculoskeletal:  Negative for joint pain and myalgias.  Skin:  Negative for itching and rash.  Neurological:  Negative for dizziness, tingling, weakness and headaches.  Endo/Heme/Allergies:  Negative for environmental allergies. Does not bruise/bleed easily.  Psychiatric/Behavioral:  Negative for depression. The patient is not nervous/anxious and does not have insomnia.   All  other systems reviewed and are negative.   Objective:  Physical Exam Vitals reviewed.  Constitutional:      General: She is not in acute distress.    Appearance: She is well-developed. She is obese.  HENT:     Head: Normocephalic and atraumatic.  Eyes:     General: No scleral icterus.    Conjunctiva/sclera: Conjunctivae normal.     Pupils: Pupils are equal, round, and reactive to light.  Neck:     Vascular: No JVD.     Trachea: No tracheal deviation.  Cardiovascular:     Rate and Rhythm: Normal rate and regular rhythm.     Heart sounds: Normal heart sounds. No murmur heard. Pulmonary:     Effort: Pulmonary effort is normal. No tachypnea, accessory muscle usage or respiratory distress.     Breath sounds: No stridor. No wheezing, rhonchi or rales.  Abdominal:     General: There is no distension.     Palpations: Abdomen is soft.     Tenderness: There is no abdominal tenderness.  Musculoskeletal:        General: No tenderness.     Cervical back: Neck supple.  Lymphadenopathy:     Cervical: No  cervical adenopathy.  Skin:    General: Skin is warm and dry.     Capillary Refill: Capillary refill takes less than 2 seconds.     Findings: No rash.  Neurological:     Mental Status: She is alert and oriented to person, place, and time.  Psychiatric:        Behavior: Behavior normal.     Vitals:   12/12/20 1134  BP: (!) 122/56  Pulse: 71  Temp: 97.7 F (36.5 C)  TempSrc: Oral  SpO2: 95%  Weight: 144 lb (65.3 kg)  Height: 5' (1.524 m)   95% on RA BMI Readings from Last 3 Encounters:  12/12/20 28.12 kg/m  05/14/20 28.04 kg/m  04/26/20 25.19 kg/m   Wt Readings from Last 3 Encounters:  12/12/20 144 lb (65.3 kg)  05/14/20 143 lb 9.6 oz (65.1 kg)  04/26/20 142 lb 3.2 oz (64.5 kg)     CBC    Component Value Date/Time   WBC 8.9 04/26/2020 2330   RBC 3.36 (L) 04/26/2020 2330   HGB 11.2 (L) 04/26/2020 2330   HCT 33.5 (L) 04/26/2020 2330   PLT 140 (L) 04/26/2020  2330   MCV 99.7 04/26/2020 2330   MCH 33.3 04/26/2020 2330   MCHC 33.4 04/26/2020 2330   RDW 14.2 04/26/2020 2330   LYMPHSABS 1.4 03/30/2019 0718   MONOABS 0.6 03/30/2019 0718   EOSABS 0.3 03/30/2019 0718   BASOSABS 0.0 03/30/2019 0718     Chest Imaging: 11/27/2020 CT chest: Reviewed images today in the office with patient.  Multiple small pulmonary nodules 1 of which has slowly enlarged over time concerning for metastatic disease with a history of renal cell cancer. The patient's images have been independently reviewed by me.    Pulmonary Functions Testing Results: No flowsheet data found.  FeNO:   Pathology:   Echocardiogram:   Heart Catheterization:     Assessment & Plan:     ICD-10-CM   1. Lung nodules  R91.8 Ambulatory referral to Pulmonology    Procedural/ Surgical Case Request: ROBOTIC ASSISTED NAVIGATIONAL BRONCHOSCOPY    2. History of renal cell cancer  Z85.528     3. History of endometrial cancer  Z85.42     4. Non-smoker  Z78.9       Discussion:  This is a 75 year old female, history of nephrectomy for renal cell cancer in 2010, endometrial cancer with hysterectomy, chemo and radiation.  Now with multiple pulmonary nodules.  1 of which is slowly enlarged.  After discussion with patient's surgical oncologist Dr. Clarene Essex by primary care they recommend biopsy of these enlarging nodules to confirm whether or not we are dealing with recurrence of renal cell.  Plan: Today in the office we discussed the risk benefits and alternatives of proceeding with robotic assisted navigational bronchoscopy. We talked about the risk of bleeding and pneumothorax. We talked about the risk of bleeding if in fact this lesion is metastatic renal cancer. Patient is agreeable to proceed with bronchoscopy.  She is not on any blood thinners or antiplatelets. 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 Will also need to make arrangements to hopefully have her  dialyzed on Monday prior to the procedure. Patient is going to reach out to her dialysis center for this. Tentative bronchoscopy date will be 12/30/2020. She had a recent CT scan of the chest we have to convert to super D format. We appreciate the PCC's help with scheduling.   Current Outpatient Medications:  Alpha-Lipoic Acid 600 MG CAPS, Take 600 mg by mouth daily., Disp: , Rfl:    aspirin 81 MG tablet, Take 81 mg by mouth at bedtime., Disp: , Rfl:    cinacalcet (SENSIPAR) 30 MG tablet, TAKE 1 TABLET BY MOUTH THREE  DAYS (TIMES) A WEEK  AFTER DIALYSIS, Disp: , Rfl:    cyanocobalamin (,VITAMIN B-12,) 1000 MCG/ML injection, Inject 1 mL (1,000 mcg total) into the muscle every 30 (thirty) days for 90 doses., Disp: 3 mL, Rfl: 29   Darbepoetin Alfa (ARANESP) 60 MCG/0.3ML SOSY injection, Inject 0.3 mLs (60 mcg total) into the vein every Tuesday with hemodialysis., Disp: 4.2 mL, Rfl:    furosemide (LASIX) 20 MG tablet, Take 20 mg by mouth See admin instructions. Takes 1 tablet 2 times daily as needed on Tuesday and Friday., Disp: , Rfl:    gabapentin (NEURONTIN) 100 MG capsule, Take 100 mg by mouth 3 (three) times daily. , Disp: , Rfl:    HYDROcodone-homatropine (HYCODAN) 5-1.5 MG/5ML syrup, Take 5 ml by mouth  as needed, Disp: , Rfl:    isosorbide mononitrate (IMDUR) 60 MG 24 hr tablet, TAKE 1 TABLET BY MOUTH EVERY DAY, Disp: 90 tablet, Rfl: 3   lidocaine-prilocaine (EMLA) cream, APPLY SMALL AMOUNT TO ACCESS SITE (AVF) 3 TIMES A WEEK 1 HOUR BEFORE DIALYSIS. COVER WITH OCCLUSIVE DRESSING (SARAN WRAP), Disp: , Rfl:    loperamide (IMODIUM A-D) 2 MG tablet, Take by mouth., Disp: , Rfl:    loratadine (CLARITIN) 10 MG tablet, Take 10 mg by mouth daily., Disp: , Rfl:    magnesium oxide (MAG-OX) 400 MG tablet, Take 400 mg by mouth daily., Disp: , Rfl:    Methoxy PEG-Epoetin Beta (MIRCERA IJ), Mircera, Disp: , Rfl:    metoprolol tartrate (LOPRESSOR) 50 MG tablet, TAKE 1 TABLET BY MOUTH TWICE A DAY, Disp:  180 tablet, Rfl: 2   nitroGLYCERIN (NITROSTAT) 0.4 MG SL tablet, Place 1 tablet (0.4 mg total) under the tongue every 5 (five) minutes as needed for chest pain., Disp: 25 tablet, Rfl: 11   Omega-3 Fatty Acids (FISH OIL) 1200 MG CAPS, Take 1,200 mg by mouth in the morning and at bedtime. , Disp: , Rfl:    omeprazole (PRILOSEC) 40 MG capsule, Take 40 mg by mouth daily before breakfast., Disp: , Rfl:    ondansetron (ZOFRAN ODT) 4 MG disintegrating tablet, Take 1 tablet (4 mg total) by mouth every 8 (eight) hours as needed for nausea or vomiting., Disp: 20 tablet, Rfl: 0   Probiotic Product (ALIGN) 4 MG CAPS, Take 1 capsule by mouth daily., Disp: , Rfl:    pyridOXINE (VITAMIN B-6) 100 MG tablet, Take 200 mg by mouth daily. , Disp: , Rfl:    sevelamer carbonate (RENVELA) 800 MG tablet, , Disp: , Rfl:    sucralfate (CARAFATE) 1 g tablet, Take 1 tablet (1 g total) by mouth 4 (four) times daily as needed., Disp: 30 tablet, Rfl: 0   atorvastatin (LIPITOR) 80 MG tablet, Take 1 tablet (80 mg total) by mouth at bedtime., Disp: 90 tablet, Rfl: 0   famotidine (PEPCID) 20 MG tablet, Take 1 tablet (20 mg total) by mouth at bedtime., Disp: 30 tablet, Rfl: 2  I spent 62 minutes dedicated to the care of this patient on the date of this encounter to include pre-visit review of records, face-to-face time with the patient discussing conditions above, post visit ordering of testing, clinical documentation with the electronic health record, making appropriate referrals as documented, and communicating necessary  findings to members of the patients care team.   Garner Nash, DO [facility] 12/12/2020 12:12 PM

## 2020-12-12 NOTE — Telephone Encounter (Signed)
I scheduled ENB for 12/20 at 12:45.  Pt will go for covid test on 12/16.  I spoke to [facility] at GI and he is going to have disc sent to me so I can forward to [facility].  I spoke to pt & gave her appt info.

## 2020-12-12 NOTE — H&P (View-Only) (Signed)
Synopsis: Referred in December 2022 for lung nodules by Heywood Bene, *  Subjective:   PATIENT ID: Dana Jackson GENDER: female DOB: 12/19/1945, MRN: [id number]  Chief Complaint  Patient presents with   Consult    Patient says she has been having trouble with a cough. She says the cough has been here for about 6 months    PMH Renal cell carcinoma in 2010 s/p nephrectomy, endometrial cancer w/ hysterectomy, chemo and radiation. Now undersurveilance imaging. Patient is a life long non-smoker.  Patient had a CT scan of the chest that was completed on11/16/2022.  This CT scan of the chest has a new 5.4 mm left upper lobe perihilar nodule on the anterior portion a 3 mm nodule in the left lower lobe and a pre-existing left lower lobe nodule that is now larger and a 6 mm right middle lobe nodule.  There is a small single right paratracheal lymph node that is larger than previous.  These images due to the patient's history of renal cell carcinoma was concerning for metastatic disease and recurrence.  Patient's surgical oncologist was contacted from [facility] by primary care.  They spoke with Dr. Clarene Essex.  He recommended a biopsy of the largest lesion.  Patient was discussed with [facility] radiology from interventional and recommended referral for evaluation of bronchoscopic tissue biopsy.  Patient here today to discuss next steps.   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  95% ost cX, 80-90% PDA --> CABG X3; b) Post CABG CATH for + Myoview with basal anterior ischemia -> Ost LAD 80% (diffuse) then 100% after SP2, RI 70% (too small for PCI), Ost-prox Cx 99% & OM1 90%, OstrPDA 80% (small); Occluded SVG-rPDA.  Patent LIMA-dLAD, SVG-OM: culprit ~  p-m LAD pre-LIMA & RI - not good PCI target --> Med Rx   CKD (chronic kidney disease) stage 3, GFR 30-59 ml/min (HCC)    Degenerative disc disease, cervical    Degenerative disc disease, cervical [722.4]   Diabetes mellitus without complication (HCC)    Type II   Endometrial cancer [facility]) November 2015   Treated with TAH with pelvic lymphadenectomy followed by radiation and chemotherapy   GERD (gastroesophageal reflux disease)    Hearing loss    bilateral - no hearing aids   Heart murmur    History of asthma     childhood   History of blood transfusion    History of pneumonia    "2-3 times"   History of unstable angina November/December 2012   T wave inversions in inferolateral leads.  No stress test performed.   Hyperlipidemia LDL goal <70    Hypertension, essential, benign    Neuropathy    hands   Pneumonia 2018   Renal cell carcinoma ([place]) 11/18/2008   T2aNx s/p partial left nephrectomy   S/P CABG x 14 December 2010   LIMA-LAD, SVG RPL, SVG-Circumflex   Stroke [facility])    TIA- no residual    TIA (transient ischemic attack) 1992 &  2010     Family History  Problem Relation Age of Onset   AAA (abdominal aortic aneurysm) Mother    Multiple myeloma Mother  GER disease Mother    Coronary artery disease Father    Heart disease Father    Coronary artery disease Sister    Coronary artery disease Brother    Heart disease Brother    Stroke Maternal Grandmother    Heart attack Neg Hx      Past Surgical History:  Procedure Laterality Date   ANTERIOR CERVICAL DECOMP/DISCECTOMY FUSION  10/11/2005   multi-level   AV FISTULA PLACEMENT Left 08/03/2017   Procedure: ARTERIOVENOUS (AV) FISTULA CREATION LEFT ARM;  Surgeon: Rosetta Posner, MD;  Location: [facility] OR;  Service: Vascular;  Laterality: Left;   CARDIAC CATHETERIZATION  12/24/10   40% left main, 80% mid LAD, 95% ostial circumflex, 80-90% PDA.   CARDIAC CATHETERIZATION N/A 07/01/2014   Procedure: Left Heart Cath and Cors/Grafts  Angiography;  Surgeon: Leonie Man, MD;  Location: [facility] INVASIVE CV LAB:  For Abn Nuc @ [facility]: Ost LAD 80%, mid LAD 100% after S/P 2, 70% RI (no PCI target), ost-prox Cx 99%, OM1 90%. Ost rPDA 80% (~ pre-CABG), occluded SVG-rPDA, patent LIMA-dLAD, SVG OM; potential culprit for abn Nuc scan = pLAD Dz, small RI 70% or PDA -> med Rx   CAROTID ENDARTERECTOMY Left    CHOLECYSTECTOMY  ~ [place] GRAFT  12/25/2010   Procedure: CORONARY ARTERY BYPASS GRAFTING (CABGX3 - LIMA-LAD, SVG RPL, SVG-Circumflex);  Surgeon: Rexene Alberts, MD;  Location: [place];  Service: Open Heart Surgery;  Laterality: N/A;   DILATATION & CURETTAGE/HYSTEROSCOPY WITH MYOSURE N/A 11/02/2013   Procedure: DILATATION & CURETTAGE/HYSTEROSCOPY WITH MYOSURE ABLATION;  Surgeon: Allena Katz, MD;  Location: [facility];  Service: Gynecology;  Laterality: N/A;   DOPPLER ECHOCARDIOGRAPHY  12/08/2010   EF =>55%,MILD CONCENTRIC LEFT VENTRICULAR HYPERTROPHY   EYE SURGERY Bilateral    Cataract   IR DIALY SHUNT INTRO NEEDLE/INTRACATH INITIAL W/IMG LEFT Left 04/24/2019   IR FLUORO GUIDE CV LINE LEFT  04/26/2019   IR US GUIDE VASC ACCESS LEFT  04/24/2019   IR US GUIDE VASC ACCESS LEFT  04/26/2019   LEFT HEART CATHETERIZATION WITH CORONARY ANGIOGRAM N/A 12/24/2010   Procedure: LEFT HEART CATHETERIZATION WITH CORONARY ANGIOGRAM;  Surgeon: Leonie Man, MD;  Location: [facility] CATH LAB;  Service: Cardiovascular;  Laterality: N/A;   NM MYOCAR SINGLE W/SPECT  07/26/2007   EF 79%, LEFT VENT.FUNCTION NORMAL   PARTIAL NEPHRECTOMY Left 11/18/2008   left partial nephrectomy for renal cell CA   PET Myocardial Perfusion Scan  06/13/2014   At [place]: Moderate size, mild severity completely reversible defect involving the basal anterior, mid anterior and apical anterior segments consistent with ischemia. EF 65% with normal global function.   TONSILLECTOMY     "in college"   TOTAL ABDOMINAL HYSTERECTOMY  November 2015     At [facility]: Robotic procedure with pelvic lymphadenectomy   TRANSTHORACIC ECHOCARDIOGRAM  06/13/2014   At [place]: EF 60-65%. Grade 1 diastolic dysfunction. Mild MR. Aortic sclerosis. 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  -- Currently being very busy taking care of 2 foster children that are staying with her daughter.  A 76-year-old and a 37-year-old that they're trying to adopt.  She is very excited about the possibility of becoming a Grandmother.   She does walk and getting exercise, but she is wanting to get back into more activities just because she has really been limited due to her arthritic pains. She used to do things like walking and biking, and she may try to do some biking again, or at least stationary biking.    Does not smoke, does not drink.   Social Determinants of Health   Financial Resource Strain: Not on file  Food Insecurity: Not on file  Transportation Needs: Not on file  Physical Activity: Not on file  Stress: Not on file  Social Connections: Not on file  Intimate Partner Violence: Not on file     Allergies  Allergen Reactions   Sulfa  Antibiotics Other (See Comments)    "AKI"    Erythromycin Itching   Pravastatin Other (See Comments)    Used 01/2011 to 03/2011 (reaction unknown)   Simvastatin Other (See Comments)    Reaction not known   Codeine Nausea Only and Other (See Comments)    Reaction not known, but can tolerate Hycodan   Gemfibrozil Itching     Outpatient Medications Prior to Visit  Medication Sig Dispense Refill   Alpha-Lipoic Acid 600 MG CAPS Take 600 mg by mouth daily.     aspirin 81 MG tablet Take 81 mg by mouth at bedtime.     cinacalcet (SENSIPAR) 30 MG tablet TAKE 1 TABLET BY MOUTH THREE  DAYS (TIMES) A WEEK  AFTER DIALYSIS     cyanocobalamin (,VITAMIN B-12,) 1000 MCG/ML injection Inject 1 mL (1,000 mcg total) into the muscle every 30 (thirty) days for 90 doses. 3 mL 29   Darbepoetin Alfa (ARANESP) 60 MCG/0.3ML SOSY injection Inject 0.3 mLs (60 mcg total) into the vein every Tuesday with hemodialysis. 4.2 mL    furosemide (LASIX) 20 MG tablet Take 20 mg by mouth See admin instructions. Takes 1 tablet 2 times daily as needed on Tuesday and Friday.     gabapentin (NEURONTIN) 100 MG capsule Take 100 mg by mouth 3 (three) times daily.      HYDROcodone-homatropine (HYCODAN) 5-1.5 MG/5ML syrup Take 5 ml by mouth  as needed     isosorbide mononitrate (IMDUR) 60 MG 24 hr tablet TAKE 1 TABLET BY MOUTH EVERY DAY 90 tablet 3   lidocaine-prilocaine (EMLA) cream APPLY SMALL AMOUNT TO ACCESS SITE (AVF) 3 TIMES A WEEK 1 HOUR BEFORE DIALYSIS. COVER WITH OCCLUSIVE DRESSING (SARAN WRAP)     loperamide (IMODIUM A-D) 2 MG tablet Take by mouth.     loratadine (CLARITIN) 10 MG tablet Take 10 mg by mouth daily.     magnesium oxide (MAG-OX) 400 MG tablet Take 400 mg by mouth daily.  Methoxy PEG-Epoetin Beta (MIRCERA IJ) Mircera     metoprolol tartrate (LOPRESSOR) 50 MG tablet TAKE 1 TABLET BY MOUTH TWICE A DAY 180 tablet 2   nitroGLYCERIN (NITROSTAT) 0.4 MG SL tablet Place 1 tablet (0.4 mg total) under the tongue every 5  (five) minutes as needed for chest pain. 25 tablet 11   Omega-3 Fatty Acids (FISH OIL) 1200 MG CAPS Take 1,200 mg by mouth in the morning and at bedtime.      omeprazole (PRILOSEC) 40 MG capsule Take 40 mg by mouth daily before breakfast.     ondansetron (ZOFRAN ODT) 4 MG disintegrating tablet Take 1 tablet (4 mg total) by mouth every 8 (eight) hours as needed for nausea or vomiting. 20 tablet 0   Probiotic Product (ALIGN) 4 MG CAPS Take 1 capsule by mouth daily.     pyridOXINE (VITAMIN B-6) 100 MG tablet Take 200 mg by mouth daily.      sevelamer carbonate (RENVELA) 800 MG tablet      sucralfate (CARAFATE) 1 g tablet Take 1 tablet (1 g total) by mouth 4 (four) times daily as needed. 30 tablet 0   atorvastatin (LIPITOR) 80 MG tablet Take 1 tablet (80 mg total) by mouth at bedtime. 90 tablet 0   famotidine (PEPCID) 20 MG tablet Take 1 tablet (20 mg total) by mouth at bedtime. 30 tablet 2   No facility-administered medications prior to visit.    Review of Systems  Constitutional:  Negative for chills, fever, malaise/fatigue and weight loss.  HENT:  Negative for hearing loss, sore throat and tinnitus.   Eyes:  Negative for blurred vision and double vision.  Respiratory:  Negative for cough, hemoptysis, sputum production, shortness of breath, wheezing and stridor.   Cardiovascular:  Negative for chest pain, palpitations, orthopnea, leg swelling and PND.  Gastrointestinal:  Negative for abdominal pain, constipation, diarrhea, heartburn, nausea and vomiting.  Genitourinary:  Negative for dysuria, hematuria and urgency.  Musculoskeletal:  Negative for joint pain and myalgias.  Skin:  Negative for itching and rash.  Neurological:  Negative for dizziness, tingling, weakness and headaches.  Endo/Heme/Allergies:  Negative for environmental allergies. Does not bruise/bleed easily.  Psychiatric/Behavioral:  Negative for depression. The patient is not nervous/anxious and does not have insomnia.   All  other systems reviewed and are negative.   Objective:  Physical Exam Vitals reviewed.  Constitutional:      General: She is not in acute distress.    Appearance: She is well-developed. She is obese.  HENT:     Head: Normocephalic and atraumatic.  Eyes:     General: No scleral icterus.    Conjunctiva/sclera: Conjunctivae normal.     Pupils: Pupils are equal, round, and reactive to light.  Neck:     Vascular: No JVD.     Trachea: No tracheal deviation.  Cardiovascular:     Rate and Rhythm: Normal rate and regular rhythm.     Heart sounds: Normal heart sounds. No murmur heard. Pulmonary:     Effort: Pulmonary effort is normal. No tachypnea, accessory muscle usage or respiratory distress.     Breath sounds: No stridor. No wheezing, rhonchi or rales.  Abdominal:     General: There is no distension.     Palpations: Abdomen is soft.     Tenderness: There is no abdominal tenderness.  Musculoskeletal:        General: No tenderness.     Cervical back: Neck supple.  Lymphadenopathy:     Cervical: No  cervical adenopathy.  Skin:    General: Skin is warm and dry.     Capillary Refill: Capillary refill takes less than 2 seconds.     Findings: No rash.  Neurological:     Mental Status: She is alert and oriented to person, place, and time.  Psychiatric:        Behavior: Behavior normal.     Vitals:   12/12/20 1134  BP: (!) 122/56  Pulse: 71  Temp: 97.7 F (36.5 C)  TempSrc: Oral  SpO2: 95%  Weight: 144 lb (65.3 kg)  Height: 5' (1.524 m)   95% on RA BMI Readings from Last 3 Encounters:  12/12/20 28.12 kg/m  05/14/20 28.04 kg/m  04/26/20 25.19 kg/m   Wt Readings from Last 3 Encounters:  12/12/20 144 lb (65.3 kg)  05/14/20 143 lb 9.6 oz (65.1 kg)  04/26/20 142 lb 3.2 oz (64.5 kg)     CBC    Component Value Date/Time   WBC 8.9 04/26/2020 2330   RBC 3.36 (L) 04/26/2020 2330   HGB 11.2 (L) 04/26/2020 2330   HCT 33.5 (L) 04/26/2020 2330   PLT 140 (L) 04/26/2020  2330   MCV 99.7 04/26/2020 2330   MCH 33.3 04/26/2020 2330   MCHC 33.4 04/26/2020 2330   RDW 14.2 04/26/2020 2330   LYMPHSABS 1.4 03/30/2019 0718   MONOABS 0.6 03/30/2019 0718   EOSABS 0.3 03/30/2019 0718   BASOSABS 0.0 03/30/2019 0718     Chest Imaging: 11/27/2020 CT chest: Reviewed images today in the office with patient.  Multiple small pulmonary nodules 1 of which has slowly enlarged over time concerning for metastatic disease with a history of renal cell cancer. The patient's images have been independently reviewed by me.    Pulmonary Functions Testing Results: No flowsheet data found.  FeNO:   Pathology:   Echocardiogram:   Heart Catheterization:     Assessment & Plan:     ICD-10-CM   1. Lung nodules  R91.8 Ambulatory referral to Pulmonology    Procedural/ Surgical Case Request: ROBOTIC ASSISTED NAVIGATIONAL BRONCHOSCOPY    2. History of renal cell cancer  Z85.528     3. History of endometrial cancer  Z85.42     4. Non-smoker  Z78.9       Discussion:  This is a 75 year old female, history of nephrectomy for renal cell cancer in 2010, endometrial cancer with hysterectomy, chemo and radiation.  Now with multiple pulmonary nodules.  1 of which is slowly enlarged.  After discussion with patient's surgical oncologist Dr. Clarene Essex by primary care they recommend biopsy of these enlarging nodules to confirm whether or not we are dealing with recurrence of renal cell.  Plan: Today in the office we discussed the risk benefits and alternatives of proceeding with robotic assisted navigational bronchoscopy. We talked about the risk of bleeding and pneumothorax. We talked about the risk of bleeding if in fact this lesion is metastatic renal cancer. Patient is agreeable to proceed with bronchoscopy.  She is not on any blood thinners or antiplatelets. 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 Will also need to make arrangements to hopefully have her  dialyzed on Monday prior to the procedure. Patient is going to reach out to her dialysis center for this. Tentative bronchoscopy date will be 12/30/2020. She had a recent CT scan of the chest we have to convert to super D format. We appreciate the PCC's help with scheduling.   Current Outpatient Medications:  Alpha-Lipoic Acid 600 MG CAPS, Take 600 mg by mouth daily., Disp: , Rfl:    aspirin 81 MG tablet, Take 81 mg by mouth at bedtime., Disp: , Rfl:    cinacalcet (SENSIPAR) 30 MG tablet, TAKE 1 TABLET BY MOUTH THREE  DAYS (TIMES) A WEEK  AFTER DIALYSIS, Disp: , Rfl:    cyanocobalamin (,VITAMIN B-12,) 1000 MCG/ML injection, Inject 1 mL (1,000 mcg total) into the muscle every 30 (thirty) days for 90 doses., Disp: 3 mL, Rfl: 29   Darbepoetin Alfa (ARANESP) 60 MCG/0.3ML SOSY injection, Inject 0.3 mLs (60 mcg total) into the vein every Tuesday with hemodialysis., Disp: 4.2 mL, Rfl:    furosemide (LASIX) 20 MG tablet, Take 20 mg by mouth See admin instructions. Takes 1 tablet 2 times daily as needed on Tuesday and Friday., Disp: , Rfl:    gabapentin (NEURONTIN) 100 MG capsule, Take 100 mg by mouth 3 (three) times daily. , Disp: , Rfl:    HYDROcodone-homatropine (HYCODAN) 5-1.5 MG/5ML syrup, Take 5 ml by mouth  as needed, Disp: , Rfl:    isosorbide mononitrate (IMDUR) 60 MG 24 hr tablet, TAKE 1 TABLET BY MOUTH EVERY DAY, Disp: 90 tablet, Rfl: 3   lidocaine-prilocaine (EMLA) cream, APPLY SMALL AMOUNT TO ACCESS SITE (AVF) 3 TIMES A WEEK 1 HOUR BEFORE DIALYSIS. COVER WITH OCCLUSIVE DRESSING (SARAN WRAP), Disp: , Rfl:    loperamide (IMODIUM A-D) 2 MG tablet, Take by mouth., Disp: , Rfl:    loratadine (CLARITIN) 10 MG tablet, Take 10 mg by mouth daily., Disp: , Rfl:    magnesium oxide (MAG-OX) 400 MG tablet, Take 400 mg by mouth daily., Disp: , Rfl:    Methoxy PEG-Epoetin Beta (MIRCERA IJ), Mircera, Disp: , Rfl:    metoprolol tartrate (LOPRESSOR) 50 MG tablet, TAKE 1 TABLET BY MOUTH TWICE A DAY, Disp:  180 tablet, Rfl: 2   nitroGLYCERIN (NITROSTAT) 0.4 MG SL tablet, Place 1 tablet (0.4 mg total) under the tongue every 5 (five) minutes as needed for chest pain., Disp: 25 tablet, Rfl: 11   Omega-3 Fatty Acids (FISH OIL) 1200 MG CAPS, Take 1,200 mg by mouth in the morning and at bedtime. , Disp: , Rfl:    omeprazole (PRILOSEC) 40 MG capsule, Take 40 mg by mouth daily before breakfast., Disp: , Rfl:    ondansetron (ZOFRAN ODT) 4 MG disintegrating tablet, Take 1 tablet (4 mg total) by mouth every 8 (eight) hours as needed for nausea or vomiting., Disp: 20 tablet, Rfl: 0   Probiotic Product (ALIGN) 4 MG CAPS, Take 1 capsule by mouth daily., Disp: , Rfl:    pyridOXINE (VITAMIN B-6) 100 MG tablet, Take 200 mg by mouth daily. , Disp: , Rfl:    sevelamer carbonate (RENVELA) 800 MG tablet, , Disp: , Rfl:    sucralfate (CARAFATE) 1 g tablet, Take 1 tablet (1 g total) by mouth 4 (four) times daily as needed., Disp: 30 tablet, Rfl: 0   atorvastatin (LIPITOR) 80 MG tablet, Take 1 tablet (80 mg total) by mouth at bedtime., Disp: 90 tablet, Rfl: 0   famotidine (PEPCID) 20 MG tablet, Take 1 tablet (20 mg total) by mouth at bedtime., Disp: 30 tablet, Rfl: 2  I spent 62 minutes dedicated to the care of this patient on the date of this encounter to include pre-visit review of records, face-to-face time with the patient discussing conditions above, post visit ordering of testing, clinical documentation with the electronic health record, making appropriate referrals as documented, and communicating necessary  findings to members of the patients care team.   Garner Nash, DO [facility] 12/12/2020 12:12 PM

## 2020-12-12 NOTE — H&P (View-Only) (Signed)
Synopsis: Referred in December 2022 for lung nodules by Heywood Bene, *  Subjective:   PATIENT ID: Dana Jackson GENDER: female DOB: 12/19/1945, MRN: [id number]  Chief Complaint  Patient presents with   Consult    Patient says she has been having trouble with a cough. She says the cough has been here for about 6 months    PMH Renal cell carcinoma in 2010 s/p nephrectomy, endometrial cancer w/ hysterectomy, chemo and radiation. Now undersurveilance imaging. Patient is a life long non-smoker.  Patient had a CT scan of the chest that was completed on11/16/2022.  This CT scan of the chest has a new 5.4 mm left upper lobe perihilar nodule on the anterior portion a 3 mm nodule in the left lower lobe and a pre-existing left lower lobe nodule that is now larger and a 6 mm right middle lobe nodule.  There is a small single right paratracheal lymph node that is larger than previous.  These images due to the patient's history of renal cell carcinoma was concerning for metastatic disease and recurrence.  Patient's surgical oncologist was contacted from [facility] by primary care.  They spoke with Dr. Clarene Essex.  He recommended a biopsy of the largest lesion.  Patient was discussed with [facility] radiology from interventional and recommended referral for evaluation of bronchoscopic tissue biopsy.  Patient here today to discuss next steps.   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  95% ost cX, 80-90% PDA --> CABG X3; b) Post CABG CATH for + Myoview with basal anterior ischemia -> Ost LAD 80% (diffuse) then 100% after SP2, RI 70% (too small for PCI), Ost-prox Cx 99% & OM1 90%, OstrPDA 80% (small); Occluded SVG-rPDA.  Patent LIMA-dLAD, SVG-OM: culprit ~  p-m LAD pre-LIMA & RI - not good PCI target --> Med Rx   CKD (chronic kidney disease) stage 3, GFR 30-59 ml/min (HCC)    Degenerative disc disease, cervical    Degenerative disc disease, cervical [722.4]   Diabetes mellitus without complication (HCC)    Type II   Endometrial cancer [facility]) November 2015   Treated with TAH with pelvic lymphadenectomy followed by radiation and chemotherapy   GERD (gastroesophageal reflux disease)    Hearing loss    bilateral - no hearing aids   Heart murmur    History of asthma     childhood   History of blood transfusion    History of pneumonia    "2-3 times"   History of unstable angina November/December 2012   T wave inversions in inferolateral leads.  No stress test performed.   Hyperlipidemia LDL goal <70    Hypertension, essential, benign    Neuropathy    hands   Pneumonia 2018   Renal cell carcinoma ([place]) 11/18/2008   T2aNx s/p partial left nephrectomy   S/P CABG x 14 December 2010   LIMA-LAD, SVG RPL, SVG-Circumflex   Stroke [facility])    TIA- no residual    TIA (transient ischemic attack) 1992 &  2010     Family History  Problem Relation Age of Onset   AAA (abdominal aortic aneurysm) Mother    Multiple myeloma Mother  GER disease Mother    Coronary artery disease Father    Heart disease Father    Coronary artery disease Sister    Coronary artery disease Brother    Heart disease Brother    Stroke Maternal Grandmother    Heart attack Neg Hx      Past Surgical History:  Procedure Laterality Date   ANTERIOR CERVICAL DECOMP/DISCECTOMY FUSION  10/11/2005   multi-level   AV FISTULA PLACEMENT Left 08/03/2017   Procedure: ARTERIOVENOUS (AV) FISTULA CREATION LEFT ARM;  Surgeon: Rosetta Posner, MD;  Location: [facility] OR;  Service: Vascular;  Laterality: Left;   CARDIAC CATHETERIZATION  12/24/10   40% left main, 80% mid LAD, 95% ostial circumflex, 80-90% PDA.   CARDIAC CATHETERIZATION N/A 07/01/2014   Procedure: Left Heart Cath and Cors/Grafts  Angiography;  Surgeon: Leonie Man, MD;  Location: [facility] INVASIVE CV LAB:  For Abn Nuc @ [facility]: Ost LAD 80%, mid LAD 100% after S/P 2, 70% RI (no PCI target), ost-prox Cx 99%, OM1 90%. Ost rPDA 80% (~ pre-CABG), occluded SVG-rPDA, patent LIMA-dLAD, SVG OM; potential culprit for abn Nuc scan = pLAD Dz, small RI 70% or PDA -> med Rx   CAROTID ENDARTERECTOMY Left    CHOLECYSTECTOMY  ~ [place] GRAFT  12/25/2010   Procedure: CORONARY ARTERY BYPASS GRAFTING (CABGX3 - LIMA-LAD, SVG RPL, SVG-Circumflex);  Surgeon: Rexene Alberts, MD;  Location: [place];  Service: Open Heart Surgery;  Laterality: N/A;   DILATATION & CURETTAGE/HYSTEROSCOPY WITH MYOSURE N/A 11/02/2013   Procedure: DILATATION & CURETTAGE/HYSTEROSCOPY WITH MYOSURE ABLATION;  Surgeon: Allena Katz, MD;  Location: [facility];  Service: Gynecology;  Laterality: N/A;   DOPPLER ECHOCARDIOGRAPHY  12/08/2010   EF =>55%,MILD CONCENTRIC LEFT VENTRICULAR HYPERTROPHY   EYE SURGERY Bilateral    Cataract   IR DIALY SHUNT INTRO NEEDLE/INTRACATH INITIAL W/IMG LEFT Left 04/24/2019   IR FLUORO GUIDE CV LINE LEFT  04/26/2019   IR US GUIDE VASC ACCESS LEFT  04/24/2019   IR US GUIDE VASC ACCESS LEFT  04/26/2019   LEFT HEART CATHETERIZATION WITH CORONARY ANGIOGRAM N/A 12/24/2010   Procedure: LEFT HEART CATHETERIZATION WITH CORONARY ANGIOGRAM;  Surgeon: Leonie Man, MD;  Location: [facility] CATH LAB;  Service: Cardiovascular;  Laterality: N/A;   NM MYOCAR SINGLE W/SPECT  07/26/2007   EF 79%, LEFT VENT.FUNCTION NORMAL   PARTIAL NEPHRECTOMY Left 11/18/2008   left partial nephrectomy for renal cell CA   PET Myocardial Perfusion Scan  06/13/2014   At [place]: Moderate size, mild severity completely reversible defect involving the basal anterior, mid anterior and apical anterior segments consistent with ischemia. EF 65% with normal global function.   TONSILLECTOMY     "in college"   TOTAL ABDOMINAL HYSTERECTOMY  November 2015     At [facility]: Robotic procedure with pelvic lymphadenectomy   TRANSTHORACIC ECHOCARDIOGRAM  06/13/2014   At [place]: EF 60-65%. Grade 1 diastolic dysfunction. Mild MR. Aortic sclerosis. 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  -- Currently being very busy taking care of 2 foster children that are staying with her daughter.  A 76-year-old and a 37-year-old that they're trying to adopt.  She is very excited about the possibility of becoming a Grandmother.   She does walk and getting exercise, but she is wanting to get back into more activities just because she has really been limited due to her arthritic pains. She used to do things like walking and biking, and she may try to do some biking again, or at least stationary biking.    Does not smoke, does not drink.   Social Determinants of Health   Financial Resource Strain: Not on file  Food Insecurity: Not on file  Transportation Needs: Not on file  Physical Activity: Not on file  Stress: Not on file  Social Connections: Not on file  Intimate Partner Violence: Not on file     Allergies  Allergen Reactions   Sulfa  Antibiotics Other (See Comments)    "AKI"    Erythromycin Itching   Pravastatin Other (See Comments)    Used 01/2011 to 03/2011 (reaction unknown)   Simvastatin Other (See Comments)    Reaction not known   Codeine Nausea Only and Other (See Comments)    Reaction not known, but can tolerate Hycodan   Gemfibrozil Itching     Outpatient Medications Prior to Visit  Medication Sig Dispense Refill   Alpha-Lipoic Acid 600 MG CAPS Take 600 mg by mouth daily.     aspirin 81 MG tablet Take 81 mg by mouth at bedtime.     cinacalcet (SENSIPAR) 30 MG tablet TAKE 1 TABLET BY MOUTH THREE  DAYS (TIMES) A WEEK  AFTER DIALYSIS     cyanocobalamin (,VITAMIN B-12,) 1000 MCG/ML injection Inject 1 mL (1,000 mcg total) into the muscle every 30 (thirty) days for 90 doses. 3 mL 29   Darbepoetin Alfa (ARANESP) 60 MCG/0.3ML SOSY injection Inject 0.3 mLs (60 mcg total) into the vein every Tuesday with hemodialysis. 4.2 mL    furosemide (LASIX) 20 MG tablet Take 20 mg by mouth See admin instructions. Takes 1 tablet 2 times daily as needed on Tuesday and Friday.     gabapentin (NEURONTIN) 100 MG capsule Take 100 mg by mouth 3 (three) times daily.      HYDROcodone-homatropine (HYCODAN) 5-1.5 MG/5ML syrup Take 5 ml by mouth  as needed     isosorbide mononitrate (IMDUR) 60 MG 24 hr tablet TAKE 1 TABLET BY MOUTH EVERY DAY 90 tablet 3   lidocaine-prilocaine (EMLA) cream APPLY SMALL AMOUNT TO ACCESS SITE (AVF) 3 TIMES A WEEK 1 HOUR BEFORE DIALYSIS. COVER WITH OCCLUSIVE DRESSING (SARAN WRAP)     loperamide (IMODIUM A-D) 2 MG tablet Take by mouth.     loratadine (CLARITIN) 10 MG tablet Take 10 mg by mouth daily.     magnesium oxide (MAG-OX) 400 MG tablet Take 400 mg by mouth daily.  Methoxy PEG-Epoetin Beta (MIRCERA IJ) Mircera     metoprolol tartrate (LOPRESSOR) 50 MG tablet TAKE 1 TABLET BY MOUTH TWICE A DAY 180 tablet 2   nitroGLYCERIN (NITROSTAT) 0.4 MG SL tablet Place 1 tablet (0.4 mg total) under the tongue every 5  (five) minutes as needed for chest pain. 25 tablet 11   Omega-3 Fatty Acids (FISH OIL) 1200 MG CAPS Take 1,200 mg by mouth in the morning and at bedtime.      omeprazole (PRILOSEC) 40 MG capsule Take 40 mg by mouth daily before breakfast.     ondansetron (ZOFRAN ODT) 4 MG disintegrating tablet Take 1 tablet (4 mg total) by mouth every 8 (eight) hours as needed for nausea or vomiting. 20 tablet 0   Probiotic Product (ALIGN) 4 MG CAPS Take 1 capsule by mouth daily.     pyridOXINE (VITAMIN B-6) 100 MG tablet Take 200 mg by mouth daily.      sevelamer carbonate (RENVELA) 800 MG tablet      sucralfate (CARAFATE) 1 g tablet Take 1 tablet (1 g total) by mouth 4 (four) times daily as needed. 30 tablet 0   atorvastatin (LIPITOR) 80 MG tablet Take 1 tablet (80 mg total) by mouth at bedtime. 90 tablet 0   famotidine (PEPCID) 20 MG tablet Take 1 tablet (20 mg total) by mouth at bedtime. 30 tablet 2   No facility-administered medications prior to visit.    ROS   Objective:  Physical Exam   Vitals:   12/12/20 1134  BP: (!) 122/56  Pulse: 71  Temp: 97.7 F (36.5 C)  TempSrc: Oral  SpO2: 95%  Weight: 144 lb (65.3 kg)  Height: 5' (1.524 m)   95% on RA BMI Readings from Last 3 Encounters:  12/12/20 28.12 kg/m  05/14/20 28.04 kg/m  04/26/20 25.19 kg/m   Wt Readings from Last 3 Encounters:  12/12/20 144 lb (65.3 kg)  05/14/20 143 lb 9.6 oz (65.1 kg)  04/26/20 142 lb 3.2 oz (64.5 kg)     CBC    Component Value Date/Time   WBC 8.9 04/26/2020 2330   RBC 3.36 (L) 04/26/2020 2330   HGB 11.2 (L) 04/26/2020 2330   HCT 33.5 (L) 04/26/2020 2330   PLT 140 (L) 04/26/2020 2330   MCV 99.7 04/26/2020 2330   MCH 33.3 04/26/2020 2330   MCHC 33.4 04/26/2020 2330   RDW 14.2 04/26/2020 2330   LYMPHSABS 1.4 03/30/2019 0718   MONOABS 0.6 03/30/2019 0718   EOSABS 0.3 03/30/2019 0718   BASOSABS 0.0 03/30/2019 0718     Chest Imaging: 11/27/2020 CT chest: Reviewed images today in the office  with patient.  Multiple small pulmonary nodules 1 of which has slowly enlarged over time concerning for metastatic disease with a history of renal cell cancer. The patient's images have been independently reviewed by me.    Pulmonary Functions Testing Results: No flowsheet data found.  FeNO:   Pathology:   Echocardiogram:   Heart Catheterization:     Assessment & Plan:     ICD-10-CM   1. Lung nodules  R91.8 Ambulatory referral to Pulmonology    Procedural/ Surgical Case Request: ROBOTIC ASSISTED NAVIGATIONAL BRONCHOSCOPY    2. History of renal cell cancer  Z85.528     3. History of endometrial cancer  Z85.42     4. Non-smoker  Z78.9       Discussion:  This is a 75 year old female, history of nephrectomy for renal cell cancer in 2010, endometrial cancer with  hysterectomy, chemo and radiation.  Now with multiple pulmonary nodules.  1 of which is slowly enlarged.  After discussion with patient's surgical oncologist Dr. Clarene Essex by primary care they recommend biopsy of these enlarging nodules to confirm whether or not we are dealing with recurrence of renal cell.  Plan: Today in the office we discussed the risk benefits and alternatives of proceeding with robotic assisted navigational bronchoscopy. We talked about the risk of bleeding and pneumothorax. We talked about the risk of bleeding if in fact this lesion is metastatic renal cancer. Patient is agreeable to proceed with bronchoscopy.  She is not on any blood thinners or antiplatelets. 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 Will also need to make arrangements to hopefully have her dialyzed on Monday prior to the procedure. Patient is going to reach out to her dialysis center for this. Tentative bronchoscopy date will be 12/30/2020. She had a recent CT scan of the chest we have to convert to super D format. We appreciate the PCC's help with scheduling.   Current Outpatient Medications:     Alpha-Lipoic Acid 600 MG CAPS, Take 600 mg by mouth daily., Disp: , Rfl:    aspirin 81 MG tablet, Take 81 mg by mouth at bedtime., Disp: , Rfl:    cinacalcet (SENSIPAR) 30 MG tablet, TAKE 1 TABLET BY MOUTH THREE  DAYS (TIMES) A WEEK  AFTER DIALYSIS, Disp: , Rfl:    cyanocobalamin (,VITAMIN B-12,) 1000 MCG/ML injection, Inject 1 mL (1,000 mcg total) into the muscle every 30 (thirty) days for 90 doses., Disp: 3 mL, Rfl: 29   Darbepoetin Alfa (ARANESP) 60 MCG/0.3ML SOSY injection, Inject 0.3 mLs (60 mcg total) into the vein every Tuesday with hemodialysis., Disp: 4.2 mL, Rfl:    furosemide (LASIX) 20 MG tablet, Take 20 mg by mouth See admin instructions. Takes 1 tablet 2 times daily as needed on Tuesday and Friday., Disp: , Rfl:    gabapentin (NEURONTIN) 100 MG capsule, Take 100 mg by mouth 3 (three) times daily. , Disp: , Rfl:    HYDROcodone-homatropine (HYCODAN) 5-1.5 MG/5ML syrup, Take 5 ml by mouth  as needed, Disp: , Rfl:    isosorbide mononitrate (IMDUR) 60 MG 24 hr tablet, TAKE 1 TABLET BY MOUTH EVERY DAY, Disp: 90 tablet, Rfl: 3   lidocaine-prilocaine (EMLA) cream, APPLY SMALL AMOUNT TO ACCESS SITE (AVF) 3 TIMES A WEEK 1 HOUR BEFORE DIALYSIS. COVER WITH OCCLUSIVE DRESSING (SARAN WRAP), Disp: , Rfl:    loperamide (IMODIUM A-D) 2 MG tablet, Take by mouth., Disp: , Rfl:    loratadine (CLARITIN) 10 MG tablet, Take 10 mg by mouth daily., Disp: , Rfl:    magnesium oxide (MAG-OX) 400 MG tablet, Take 400 mg by mouth daily., Disp: , Rfl:    Methoxy PEG-Epoetin Beta (MIRCERA IJ), Mircera, Disp: , Rfl:    metoprolol tartrate (LOPRESSOR) 50 MG tablet, TAKE 1 TABLET BY MOUTH TWICE A DAY, Disp: 180 tablet, Rfl: 2   nitroGLYCERIN (NITROSTAT) 0.4 MG SL tablet, Place 1 tablet (0.4 mg total) under the tongue every 5 (five) minutes as needed for chest pain., Disp: 25 tablet, Rfl: 11   Omega-3 Fatty Acids (FISH OIL) 1200 MG CAPS, Take 1,200 mg by mouth in the morning and at bedtime. , Disp: , Rfl:    omeprazole  (PRILOSEC) 40 MG capsule, Take 40 mg by mouth daily before breakfast., Disp: , Rfl:    ondansetron (ZOFRAN ODT) 4 MG disintegrating tablet, Take 1 tablet (4 mg  total) by mouth every 8 (eight) hours as needed for nausea or vomiting., Disp: 20 tablet, Rfl: 0   Probiotic Product (ALIGN) 4 MG CAPS, Take 1 capsule by mouth daily., Disp: , Rfl:    pyridOXINE (VITAMIN B-6) 100 MG tablet, Take 200 mg by mouth daily. , Disp: , Rfl:    sevelamer carbonate (RENVELA) 800 MG tablet, , Disp: , Rfl:    sucralfate (CARAFATE) 1 g tablet, Take 1 tablet (1 g total) by mouth 4 (four) times daily as needed., Disp: 30 tablet, Rfl: 0   atorvastatin (LIPITOR) 80 MG tablet, Take 1 tablet (80 mg total) by mouth at bedtime., Disp: 90 tablet, Rfl: 0   famotidine (PEPCID) 20 MG tablet, Take 1 tablet (20 mg total) by mouth at bedtime., Disp: 30 tablet, Rfl: 2  I spent 62 minutes dedicated to the care of this patient on the date of this encounter to include pre-visit review of records, face-to-face time with the patient discussing conditions above, post visit ordering of testing, clinical documentation with the electronic health record, making appropriate referrals as documented, and communicating necessary findings to members of the patients care team.   Garner Nash, DO [facility] 12/12/2020 12:12 PM

## 2020-12-12 NOTE — Patient Instructions (Signed)
Thank you for visiting Dr. Valeta Harms at [facility]. Today we recommend the following:  Orders Placed This Encounter  Procedures   Procedural/ Surgical Case Request: ROBOTIC ASSISTED NAVIGATIONAL BRONCHOSCOPY   Ambulatory referral to Pulmonology   Tentative bronchoscopy on 12/30/2020  Return in about 25 days (around 01/06/2021) for with [name], NP, or Dr. Valeta Harms.    Please do your part to reduce the spread of COVID-19.

## 2020-12-17 NOTE — Telephone Encounter (Signed)
Disc received and I sent it Shannon at [facility].

## 2020-12-26 ENCOUNTER — Other Ambulatory Visit: Payer: Self-pay | Admitting: Pulmonary Disease

## 2020-12-27 ENCOUNTER — Other Ambulatory Visit: Payer: Self-pay | Admitting: Cardiology

## 2020-12-27 LAB — SARS CORONAVIRUS 2 (TAT 6-24 HRS): SARS Coronavirus 2: NEGATIVE

## 2020-12-28 ENCOUNTER — Encounter (HOSPITAL_COMMUNITY): Payer: Self-pay | Admitting: Pulmonary Disease

## 2020-12-28 NOTE — Progress Notes (Signed)
PCP - Manvel williams PA Cardiologist - Ellyn Hack EKG - 04/26/20 Chest x-ray - 10/15/20 ECHO - 03/1719 Cardiac Cath - 07/01/14 CPAP - na  Fasting Blood Sugar:  107 Checks Blood Sugar:  1/day   Aspirin Instructions: last dose 12/27/20  ERAS Protcol - yes  COVID TEST- negative 12/26/20  Anesthesia review: yes: medical and cardiac hx. Dialysis tu-th-sat @ [facility] dialysis/ Pt. Stated she is going to dialysis 12/29/20.  -------------  SDW INSTRUCTIONS:  Your procedure is scheduled on 12/30/20. Please report to [facility] Main Entrance "A" at 1015 A.M., and check in at the Admitting office. Call this number if you have problems the morning of surgery: [phone]   Remember: Do not eat  after midnight the night before your surgery  You may drink clear liquids until 0945 the morning of your surgery.   Clear liquids allowed are: Water, Non-Citrus Juices (without pulp), Carbonated Beverages, Clear Tea, Black Coffee Only, and Gatorade   Medications to take morning of surgery with a sip of water include: Gabapentin,imdur,claritin,metoprolol,prilosec,prn: tyleol,tessalon,flonase,imodium,nitroglycerine  As of today, STOP taking any Aspirin (unless otherwise instructed by your surgeon), Aleve, Naproxen, Ibuprofen, Motrin, Advil, Goody's, BC's, all herbal medications, fish oil, and all vitamins.    The Morning of Surgery Do not wear jewelry, make-up or nail polish. Do not wear lotions, powders, or perfumes/colognes, or deodorant Do not shave 48 hours prior to surgery.   Men may shave face and neck. Do not bring valuables to the hospital. [facility] is not responsible for any belongings or valuables.  If you are a smoker, DO NOT Smoke 24 hours prior to surgery  If you wear a CPAP at night please bring your mask the morning of surgery   Remember that you must have someone to transport you home after your surgery, and remain with you for 24 hours if you  are discharged the same day.  Please bring cases for contacts, glasses, hearing aids, dentures or bridgework because it cannot be worn into surgery.   Patients discharged the day of surgery will not be allowed to drive home.   Please shower the NIGHT BEFORE/MORNING OF SURGERY (use antibacterial soap like DIAL soap if possible). Wear comfortable clothes the morning of surgery. Oral Hygiene is also important to reduce your risk of infection.  Remember - BRUSH YOUR TEETH THE MORNING OF SURGERY WITH YOUR REGULAR TOOTHPASTE  Patient denies shortness of breath, fever, cough and chest pain.

## 2020-12-29 NOTE — Progress Notes (Signed)
Anesthesia Chart Review: Same day workup  Follows with cardiologist Dr. Ellyn Hack for history of HTN, HLD CAD s/p CABG x3.  Last cath June 2016 showed occluded graft to the PDA with severe ostial PDA disease, but only moderate mid RCA disease and therefore stable flow to the posterolateral system.  Patent LIMA to diffusely diseased LAD and patent vein graft to OM1 with moderate disease in the AV groove circumflex lesion OM 2.  At that time it was per Dr. Allison Quarry notes, it was decided to avoid further ischemic evaluation unless patient has symptoms.  Patient was last seen 05/14/2020 and doing well.  No further exertional chest pain or pressure on current medications.  She is maintained on Lopressor and Imdur.  Per note at that time, she was not requiring any PRN nitroglycerin.  She was recommended to follow-up yearly.  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  Dialyzes Tuesday Thursday Saturday.  Per Dr. Juline Patch note 12/12/2020, was arranging for patient to have dialysis on Monday prior to procedure.  History of diet-controlled DM2.  Will need DOS labs and eval.   EKG 04/26/20: Sinus rhythm. Rate 85. Probable LVH with secondary repol abnrm  CT chest 11/26/2020: IMPRESSION: 1. There is a new 5.4 mm left upper lobe perihilar nodule anteriorly, a new 3 mm left lower lobe nodule, and a pre-existing left lower lobe nodule which is larger than previously as well as a stable 6 mm right middle lobe nodule. In the mediastinum a single low right paratracheal lymph node is larger than previously. The findings are concerning for metastatic disease. 2. Evidence of left lower lobe bronchitis, and haziness in the lingula which could be atelectasis or pneumonitis. 3. 1.3 cm right thyroid nodule. 4. Cardiomegaly with old CABG changes, mildly prominent pulmonary trunk and stable nonaneurysmal ascending aortic ectasia. Aortic and coronary calcific arteriosclerosis. 5.  Interval volume loss in both kidneys, with multiple cortical hypodensities which are poorly characterized on CT. Follow-up as indicated. Prior subtotal left nephrectomy. 6. 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 Left ventricular ejection fraction, by estimation, is 65 to 70%. The  left ventricle has normal function. The left ventricle has no regional  wall motion abnormalities. There is mild left ventricular hypertrophy.  Left ventricular diastolic parameters  are indeterminate.   2. Right ventricule is not well visualized but systolic function appears  grossly normal. The right ventricular size is normal. Tricuspid  regurgitation signal is inadequate for assessing PA pressure.   3. The mitral valve is abnormal. Moderate mitral annular calcification.  Trivial mitral valve regurgitation.   4. The aortic valve is tricuspid. Aortic valve regurgitation is trivial.  Mild to moderate aortic valve sclerosis/calcification is present, without  any evidence of aortic stenosis.   5. Aortic dilatation noted. 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   6. The inferior vena cava is normal in size with greater than 50%  respiratory variability, suggesting right atrial pressure of 3 mmHg.   Cath 07/01/2014: Severe multivessel native CAD with 2 of 3 grafts patent and occluded SVG-rPDA. Patent LIMA-~dLAD, SVG-OM. Occluded SVG-rPDA at the origin Ost LAD lesion, 80% stenosed. Mid LAD lesion, 100% stenosed after 2nd septal perforator. The proximal-mid LAD is at least moderately diseased Ramus lesion, 70% stenosed - small caliber, non-PCI amenable Ost Cx to Prox Cx lesion, 99% stenosed. 1st Mrg lesion, 90% stenosed. Ost RPDA to RPDA lesion, 80%  stenosed. Similar to pre-CABG. < 2 mm vessel. Potential culprit for abnormal Nuclear Stress Test is the diffusely diseased proximal-mid LAD with diseased septal perforators from LAD & rPDA   No PCI target available to  treat the "anterior ischemia" in an asymptomatic patient.   Recommendations: Post Femoral cath with sheath removal  Aggressive IV fluid hydration overnight Overnight - outpatient in bed for post cath hydration given CKD. Continue current med Rx. Will need to check CMP at the end of the week.    Wynonia Musty [facility]/Anesthesiology Phone [phone] 12/29/2020 1:37 PM

## 2020-12-29 NOTE — Anesthesia Preprocedure Evaluation (Addendum)
Anesthesia Evaluation  Patient identified by MRN, date of birth, ID band Patient awake    Reviewed: Allergy & Precautions, NPO status , Patient's Chart, lab work & pertinent test results  Airway Mallampati: II  TM Distance: >3 FB Neck ROM: Full    Dental no notable dental hx.    Pulmonary asthma ,    Pulmonary exam normal breath sounds clear to auscultation       Cardiovascular hypertension, Pt. on home beta blockers + CAD and + CABG  Normal cardiovascular exam Rhythm:Regular Rate:Normal  ECG: SR, rate 85   Neuro/Psych TIACVA, No Residual Symptoms negative psych ROS   GI/Hepatic Neg liver ROS, GERD  Medicated and Controlled,  Endo/Other  diabetes  Renal/GU ESRF and DialysisRenal disease     Musculoskeletal  (+) Arthritis ,   Abdominal   Peds  Hematology  (+) anemia ,   Anesthesia Other Findings lung nodule  Reproductive/Obstetrics                           Anesthesia Physical Anesthesia Plan  ASA: 3  Anesthesia Plan: General   Post-op Pain Management:    Induction:   PONV Risk Score and Plan: 3 and Ondansetron, Dexamethasone and Treatment may vary due to age or medical condition  Airway Management Planned: Oral ETT  Additional Equipment:   Intra-op Plan:   Post-operative Plan: Extubation in OR  Informed Consent: I have reviewed the patients History and Physical, chart, labs and discussed the procedure including the risks, benefits and alternatives for the proposed anesthesia with the patient or authorized representative who has indicated his/her understanding and acceptance.     Dental advisory given  Plan Discussed with: CRNA  Anesthesia Plan Comments: (PAT note by Karoline Caldwell, PA-C: Follows with cardiologist Dr. Ellyn Hack for history of HTN, HLD CAD s/p CABG x3.  Last cath June 2016 showed occluded graft to the PDA with severe ostial PDA disease, but only moderate mid RCA  disease and therefore stable flow to the posterolateral system. Patent LIMA to diffusely diseased LAD and patent vein graft to OM1 with moderate disease in the AV groove circumflex lesion OM 2.  At that time it was per Dr. Allison Quarry notes, it was decided to avoid further ischemic evaluation unless patient has symptoms.  Patient was last seen 05/14/2020 and doing well.  No further exertional chest pain or pressure on current medications.  She is maintained on Lopressor and Imdur.  Per note at that time, she was not requiring any PRN nitroglycerin.  She was recommended to follow-up yearly.  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  Dialyzes Tuesday Thursday Saturday.  Per Dr. Juline Patch note 12/12/2020, was arranging for patient to have dialysis on Monday prior to procedure.  History of diet-controlled DM2.  Will need DOS labs and eval.   EKG 04/26/20: Sinus rhythm. Rate 85. Probable LVH with secondary repol abnrm  CT chest 11/26/2020: IMPRESSION: 1. There is a new 5.4 mm left upper lobe perihilar nodule anteriorly, a new 3 mm left lower lobe nodule, and a pre-existing left lower lobe nodule which is larger than previously as well as a stable 6 mm right middle lobe nodule. In the mediastinum a single low right paratracheal lymph node is larger than previously. The findings are concerning for metastatic disease. 2. Evidence of left lower lobe bronchitis, and haziness in the lingula which could be atelectasis or pneumonitis.  3. 1.3 cm right thyroid nodule. 4. Cardiomegaly with old CABG changes, mildly prominent pulmonary trunk and stable nonaneurysmal ascending aortic ectasia. Aortic and coronary calcific arteriosclerosis. 5. Interval volume loss in both kidneys, with multiple cortical hypodensities which are poorly characterized on CT. Follow-up as indicated. Prior subtotal left nephrectomy. 6. 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  Left ventricular ejection fraction, by estimation, is 65 to 70%. The  left ventricle has normal function. The left ventricle has no regional  wall motion abnormalities. There is mild left ventricular hypertrophy.  Left ventricular diastolic parameters  are indeterminate.  2. Right ventricule is not well visualized but systolic function appears  grossly normal. The right ventricular size is normal. Tricuspid  regurgitation signal is inadequate for assessing PA pressure.  3. The mitral valve is abnormal. Moderate mitral annular calcification.  Trivial mitral valve regurgitation.  4. The aortic valve is tricuspid. Aortic valve regurgitation is trivial.  Mild to moderate aortic valve sclerosis/calcification is present, without  any evidence of aortic stenosis.  5. Aortic dilatation noted. 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  6. The inferior vena cava is normal in size with greater than 50%  respiratory variability, suggesting right atrial pressure of 3 mmHg.   Cath 07/01/2014: 1. Severe multivessel native CAD with 2 of 3 grafts patent and occluded SVG-rPDA. Patent LIMA-~dLAD, SVG-OM. Occluded SVG-rPDA at the origin 2. Ost LAD lesion, 80% stenosed. Mid LAD lesion, 100% stenosed after 2nd septal perforator. The proximal-mid LAD is at least moderately diseased 3. Ramus lesion, 70% stenosed - small caliber, non-PCI amenable 4. Ost Cx to Prox Cx lesion, 99% stenosed. 1st Mrg lesion, 90% stenosed. 5. Ost RPDA to RPDA lesion, 80% stenosed. Similar to pre-CABG. < 2 mm vessel. 6. Potential culprit for abnormal Nuclear Stress Test is the diffusely diseased proximal-mid LAD with diseased septal perforators from LAD & rPDA  No PCI target available to treat the "anterior ischemia" in an asymptomatic patient.  Recommendations:  Post Femoral cath with sheath removal   Aggressive IV fluid hydration overnight  Overnight - outpatient in bed for post cath hydration  given CKD.  Continue current med Rx.  Will need to check CMP at the end of the week. )      Anesthesia Quick Evaluation

## 2020-12-30 ENCOUNTER — Ambulatory Visit (HOSPITAL_COMMUNITY): Payer: Medicare PPO

## 2020-12-30 ENCOUNTER — Ambulatory Visit (HOSPITAL_COMMUNITY)
Admission: RE | Admit: 2020-12-30 | Discharge: 2020-12-30 | Disposition: A | Discharge status: Home / Self Care | Payer: Medicare PPO | Attending: Pulmonary Disease | Admitting: Pulmonary Disease

## 2020-12-30 ENCOUNTER — Encounter (HOSPITAL_COMMUNITY)
Admission: RE | Disposition: A | Discharge status: Home / Self Care | Payer: Self-pay | Source: Home / Self Care | Attending: Pulmonary Disease

## 2020-12-30 ENCOUNTER — Other Ambulatory Visit: Payer: Self-pay

## 2020-12-30 ENCOUNTER — Ambulatory Visit (HOSPITAL_COMMUNITY): Payer: Medicare PPO | Admitting: Physician Assistant

## 2020-12-30 DIAGNOSIS — Z992 Dependence on renal dialysis: Secondary | ICD-10-CM | POA: Insufficient documentation

## 2020-12-30 DIAGNOSIS — R846 Abnormal cytological findings in specimens from respiratory organs and thorax: Secondary | ICD-10-CM | POA: Diagnosis not present

## 2020-12-30 DIAGNOSIS — R911 Solitary pulmonary nodule: Secondary | ICD-10-CM | POA: Insufficient documentation

## 2020-12-30 DIAGNOSIS — I120 Hypertensive chronic kidney disease with stage 5 chronic kidney disease or end stage renal disease: Secondary | ICD-10-CM | POA: Insufficient documentation

## 2020-12-30 DIAGNOSIS — N186 End stage renal disease: Secondary | ICD-10-CM | POA: Diagnosis not present

## 2020-12-30 DIAGNOSIS — E785 Hyperlipidemia, unspecified: Secondary | ICD-10-CM | POA: Insufficient documentation

## 2020-12-30 DIAGNOSIS — E1122 Type 2 diabetes mellitus with diabetic chronic kidney disease: Secondary | ICD-10-CM | POA: Diagnosis not present

## 2020-12-30 DIAGNOSIS — I25810 Atherosclerosis of coronary artery bypass graft(s) without angina pectoris: Secondary | ICD-10-CM | POA: Diagnosis not present

## 2020-12-30 DIAGNOSIS — R918 Other nonspecific abnormal finding of lung field: Secondary | ICD-10-CM | POA: Diagnosis not present

## 2020-12-30 DIAGNOSIS — Z419 Encounter for procedure for purposes other than remedying health state, unspecified: Secondary | ICD-10-CM

## 2020-12-30 DIAGNOSIS — Z905 Acquired absence of kidney: Secondary | ICD-10-CM | POA: Diagnosis not present

## 2020-12-30 DIAGNOSIS — Z9889 Other specified postprocedural states: Secondary | ICD-10-CM

## 2020-12-30 HISTORY — PX: VIDEO BRONCHOSCOPY WITH RADIAL ENDOBRONCHIAL ULTRASOUND: SHX6849

## 2020-12-30 HISTORY — PX: BRONCHIAL BRUSHINGS: SHX5108

## 2020-12-30 HISTORY — DX: Dyspnea, unspecified: R06.00

## 2020-12-30 HISTORY — PX: BRONCHIAL BIOPSY: SHX5109

## 2020-12-30 HISTORY — PX: BRONCHIAL NEEDLE ASPIRATION BIOPSY: SHX5106

## 2020-12-30 LAB — POCT I-STAT, CHEM 8
BUN: 24 mg/dL — ABNORMAL HIGH (ref 8–23)
Calcium, Ion: 1.23 mmol/L (ref 1.15–1.40)
Chloride: 98 mmol/L (ref 98–111)
Creatinine, Ser: 3.5 mg/dL — ABNORMAL HIGH (ref 0.44–1.00)
Glucose, Bld: 117 mg/dL — ABNORMAL HIGH (ref 70–99)
HCT: 29 % — ABNORMAL LOW (ref 36.0–46.0)
Hemoglobin: 9.9 g/dL — ABNORMAL LOW (ref 12.0–15.0)
Potassium: 4.3 mmol/L (ref 3.5–5.1)
Sodium: 137 mmol/L (ref 135–145)
TCO2: 31 mmol/L (ref 22–32)

## 2020-12-30 LAB — SURGICAL PCR SCREEN
MRSA, PCR: NEGATIVE
Staphylococcus aureus: NEGATIVE

## 2020-12-30 LAB — GLUCOSE, CAPILLARY
Glucose-Capillary: 126 mg/dL — ABNORMAL HIGH (ref 70–99)
Glucose-Capillary: 127 mg/dL — ABNORMAL HIGH (ref 70–99)

## 2020-12-30 SURGERY — BRONCHOSCOPY, WITH BIOPSY USING ELECTROMAGNETIC NAVIGATION
Anesthesia: General | Laterality: Left

## 2020-12-30 MED ORDER — PROPOFOL 10 MG/ML IV BOLUS
INTRAVENOUS | Status: DC | PRN
  Administered 2020-12-30: 100 mg via INTRAVENOUS

## 2020-12-30 MED ORDER — NEOSTIGMINE METHYLSULFATE 3 MG/3ML IV SOSY
PREFILLED_SYRINGE | INTRAVENOUS | Status: DC | PRN
  Administered 2020-12-30: 5 mg via INTRAVENOUS

## 2020-12-30 MED ORDER — GLYCOPYRROLATE PF 0.2 MG/ML IJ SOSY
PREFILLED_SYRINGE | INTRAMUSCULAR | Status: DC | PRN
  Administered 2020-12-30: .4 mg via INTRAVENOUS

## 2020-12-30 MED ORDER — PHENYLEPHRINE HCL-NACL 20-0.9 MG/250ML-% IV SOLN
INTRAVENOUS | Status: DC | PRN
  Administered 2020-12-30: 25 ug/min via INTRAVENOUS

## 2020-12-30 MED ORDER — LACTATED RINGERS IV SOLN: INTRAVENOUS | Status: DC

## 2020-12-30 MED ORDER — DEXAMETHASONE SODIUM PHOSPHATE 10 MG/ML IJ SOLN
INTRAMUSCULAR | Status: DC | PRN
  Administered 2020-12-30: 5 mg via INTRAVENOUS

## 2020-12-30 MED ORDER — SODIUM CHLORIDE 0.9 % IV SOLN: INTRAVENOUS | Status: DC

## 2020-12-30 MED ORDER — ACETAMINOPHEN 10 MG/ML IV SOLN: 1000.0000 mg | Freq: Once | INTRAVENOUS | Status: DC | PRN

## 2020-12-30 MED ORDER — EPHEDRINE SULFATE-NACL 50-0.9 MG/10ML-% IV SOSY
PREFILLED_SYRINGE | INTRAVENOUS | Status: DC | PRN
  Administered 2020-12-30 (×2): 5 mg via INTRAVENOUS
  Administered 2020-12-30: 10 mg via INTRAVENOUS
  Administered 2020-12-30: 5 mg via INTRAVENOUS

## 2020-12-30 MED ORDER — CHLORHEXIDINE GLUCONATE 0.12 % MT SOLN
OROMUCOSAL | Status: AC
  Administered 2020-12-30: 11:00:00 15 mL via OROMUCOSAL
  Filled 2020-12-30: qty 15

## 2020-12-30 MED ORDER — ONDANSETRON HCL 4 MG/2ML IJ SOLN
INTRAMUSCULAR | Status: DC | PRN
  Administered 2020-12-30: 4 mg via INTRAVENOUS

## 2020-12-30 MED ORDER — LIDOCAINE 2% (20 MG/ML) 5 ML SYRINGE
INTRAMUSCULAR | Status: DC | PRN
  Administered 2020-12-30: 60 mg via INTRAVENOUS

## 2020-12-30 MED ORDER — ONDANSETRON HCL 4 MG/2ML IJ SOLN: 4.0000 mg | Freq: Once | INTRAMUSCULAR | Status: DC | PRN

## 2020-12-30 MED ORDER — CHLORHEXIDINE GLUCONATE 0.12 % MT SOLN
15.0000 mL | Freq: Once | OROMUCOSAL | Status: AC
  Filled 2020-12-30: qty 15

## 2020-12-30 MED ORDER — FENTANYL CITRATE (PF) 100 MCG/2ML IJ SOLN
INTRAMUSCULAR | Status: DC | PRN
  Administered 2020-12-30: 100 ug via INTRAVENOUS

## 2020-12-30 MED ORDER — CISATRACURIUM BESYLATE (PF) 10 MG/5ML IV SOLN
INTRAVENOUS | Status: DC | PRN
  Administered 2020-12-30: 12 mg via INTRAVENOUS

## 2020-12-30 MED ORDER — FENTANYL CITRATE (PF) 100 MCG/2ML IJ SOLN: 25.0000 ug | INTRAMUSCULAR | Status: DC | PRN

## 2020-12-30 NOTE — Anesthesia Postprocedure Evaluation (Signed)
Anesthesia Post Note  Patient: Dana Jackson  Procedure(s) Performed: ROBOTIC ASSISTED NAVIGATIONAL BRONCHOSCOPY (Left) BRONCHIAL BIOPSIES BRONCHIAL NEEDLE ASPIRATION BIOPSIES BRONCHIAL BRUSHINGS RADIAL ENDOBRONCHIAL ULTRASOUND     Patient location during evaluation: PACU Anesthesia Type: General Level of consciousness: awake Pain management: pain level controlled Vital Signs Assessment: post-procedure vital signs reviewed and stable Respiratory status: spontaneous breathing, nonlabored ventilation, respiratory function stable and patient connected to nasal cannula oxygen Cardiovascular status: blood pressure returned to baseline and stable Postop Assessment: no apparent nausea or vomiting Anesthetic complications: no   No notable events documented.  Last Vitals:  Vitals:   12/30/20 1427 12/30/20 1442  BP: (!) 124/51 (!) 128/54  Pulse: 67 68  Resp: 15 17  Temp:  36.4 C  SpO2: 98% 96%    Last Pain:  Vitals:   12/30/20 1442  TempSrc:   PainSc: 0-No pain                 Garion Wempe P Venissa Nappi

## 2020-12-30 NOTE — Op Note (Signed)
Video Bronchoscopy with Robotic Assisted Bronchoscopic Navigation   Date of Operation: 12/30/2020   Pre-op Diagnosis: Left Lower Lobe  Post-op Diagnosis: Left Lower Lobe   Surgeon: Garner Nash, DO   Assistants: None   Anesthesia: General endotracheal anesthesia  Operation: Flexible video fiberoptic bronchoscopy with robotic assistance and biopsies.  Estimated Blood Loss: Minimal  Complications: None  Indications and History: Dana Jackson is a 75 y.o. female with history of Left Lower Lobe. The risks, benefits, complications, treatment options and expected outcomes were discussed with the patient.  The possibilities of pneumothorax, pneumonia, reaction to medication, pulmonary aspiration, perforation of a viscus, bleeding, failure to diagnose a condition and creating a complication requiring transfusion or operation were discussed with the patient who freely signed the consent.    Description of Procedure: The patient was seen in the Preoperative Area, was examined and was deemed appropriate to proceed.  The patient was taken to [facility] endoscopy room 3, identified as Dana Jackson and the procedure verified as Flexible Video Fiberoptic Bronchoscopy.  A Time Out was held and the above information confirmed.   Prior to the date of the procedure a high-resolution CT scan of the chest was performed. Utilizing ION software program a virtual tracheobronchial tree was generated to allow the creation of distinct navigation pathways to the patient's parenchymal abnormalities. After being taken to the operating room general anesthesia was initiated and the patient  was orally intubated. The video fiberoptic bronchoscope was introduced via the endotracheal tube and a general inspection was performed which showed normal right and left lung anatomy, aspiration of the bilateral mainstems was completed to remove any remaining secretions. Robotic catheter inserted into patient's endotracheal tube.    Target #1 left lower lobe: The distinct navigation pathways prepared prior to this procedure were then utilized to navigate to patient's lesion identified on CT scan. The robotic catheter was secured into place and the vision probe was withdrawn.  Lesion location was approximated using fluoroscopy and radial endobronchial ultrasound, and 3D CBCT imaging for peripheral targeting. Under fluoroscopic guidance transbronchial needle brushings, transbronchial needle biopsies, and transbronchial forceps biopsies were performed to be sent for cytology and pathology.  At the end of the procedure a general airway inspection was performed and there was no evidence of active bleeding. The bronchoscope was removed.  The patient tolerated the procedure well. There was no significant blood loss and there were no obvious complications. A post-procedural chest x-ray is pending.  Samples Target #1: 1. Transbronchial needle brushings from LLL 2. Transbronchial Wang needle biopsies from LLL 3. Transbronchial forceps biopsies from LLL  Plans:  The patient will be discharged from the PACU to home when recovered from anesthesia and after chest x-ray is reviewed. We will review the cytology, pathology and microbiology results with the patient when they become available. Outpatient followup will be with Garner Nash, DO.  Garner Nash, DO [facility] 12/30/2020 2:01 PM

## 2020-12-30 NOTE — Anesthesia Procedure Notes (Signed)
Procedure Name: Intubation Date/Time: 12/30/2020 12:13 PM Performed by: Colin Benton, CRNA Pre-anesthesia Checklist: Patient identified, Emergency Drugs available, Suction available and Patient being monitored Patient Re-evaluated:Patient Re-evaluated prior to induction Oxygen Delivery Method: Circle system utilized Preoxygenation: Pre-oxygenation with 100% oxygen Induction Type: IV induction Ventilation: Mask ventilation without difficulty Laryngoscope Size: Glidescope and 3 Grade View: Grade I Tube type: Oral Tube size: 8.5 mm Number of attempts: 2 Airway Equipment and Method: Oral airway, Rigid stylet and Video-laryngoscopy Placement Confirmation: ETT inserted through vocal cords under direct vision, positive ETCO2 and breath sounds checked- equal and bilateral Secured at: 22 cm Tube secured with: Tape Dental Injury: Teeth and Oropharynx as per pre-operative assessment  Comments: DL x 1 with Mil 2.  Grade 3 view.  DL x 2 with glidescope 3.  Successful intubation, EBBS and VSS

## 2020-12-30 NOTE — Discharge Instructions (Signed)
Flexible Bronchoscopy, Care After This sheet gives you information about how to care for yourself after your test. Your doctor may also give you more specific instructions. If you have problems or questions, contact your doctor. Follow these instructions at home: Eating and drinking Do not eat or drink anything (not even water) for 2 hours after your test, or until your numbing medicine (local anesthetic) wears off. When your numbness is gone and your cough and gag reflexes have come back, you may: Eat only soft foods. Slowly drink liquids. The day after the test, go back to your normal diet. Driving Do not drive for 24 hours if you were given a medicine to help you relax (sedative). Do not drive or use heavy machinery while taking prescription pain medicine. General instructions  Take over-the-counter and prescription medicines only as told by your doctor. Return to your normal activities as told. Ask what activities are safe for you. Do not use any products that have nicotine or tobacco in them. This includes cigarettes and e-cigarettes. If you need help quitting, ask your doctor. Keep all follow-up visits as told by your doctor. This is important. It is very important if you had a tissue sample (biopsy) taken. Get help right away if: You have shortness of breath that gets worse. You get light-headed. You feel like you are going to pass out (faint). You have chest pain. You cough up: More than a little blood. More blood than before. Summary Do not eat or drink anything (not even water) for 2 hours after your test, or until your numbing medicine wears off. Do not use cigarettes. Do not use e-cigarettes. Get help right away if you have chest pain.  This information is not intended to replace advice given to you by your health care provider. Make sure you discuss any questions you have with your health care provider. Document Released: 10/25/2008 Document Revised: 12/10/2016 Document  Reviewed: 01/16/2016 Elsevier Patient Education  2020 [employer].

## 2020-12-30 NOTE — Interval H&P Note (Signed)
History and Physical Interval Note:  12/30/2020 10:28 AM  Dana Jackson  has presented today for surgery, with the diagnosis of lung nodule.  The various methods of treatment have been discussed with the patient and family. After consideration of risks, benefits and other options for treatment, the patient has consented to  Procedure(s) with comments: ROBOTIC ASSISTED NAVIGATIONAL BRONCHOSCOPY (Left) - ION w/ CIOS as a surgical intervention.  The patient's history has been reviewed, patient examined, no change in status, stable for surgery.  I have reviewed the patient's chart and labs.  Questions were answered to the patient's satisfaction.     [place]

## 2020-12-30 NOTE — Transfer of Care (Signed)
Immediate Anesthesia Transfer of Care Note  Patient: Dana Jackson  Procedure(s) Performed: ROBOTIC ASSISTED NAVIGATIONAL BRONCHOSCOPY (Left) BRONCHIAL BIOPSIES BRONCHIAL NEEDLE ASPIRATION BIOPSIES BRONCHIAL BRUSHINGS RADIAL ENDOBRONCHIAL ULTRASOUND  Patient Location: PACU  Anesthesia Type:General  Level of Consciousness: drowsy and patient cooperative  Airway & Oxygen Therapy: Patient Spontanous Breathing and Patient connected to face mask oxygen  Post-op Assessment: Report given to RN and Post -op Vital signs reviewed and stable  Post vital signs: Reviewed and stable  Last Vitals:  Vitals Value Taken Time  BP 166/66 12/30/20 1327  Temp    Pulse 74 12/30/20 1329  Resp 19 12/30/20 1329  SpO2 100 % 12/30/20 1329  Vitals shown include unvalidated device data.  Last Pain:  Vitals:   12/30/20 1038  TempSrc:   PainSc: 0-No pain         Complications: No notable events documented.

## 2021-01-01 ENCOUNTER — Encounter (HOSPITAL_COMMUNITY): Payer: Self-pay | Admitting: Pulmonary Disease

## 2021-01-01 ENCOUNTER — Telehealth: Payer: Self-pay | Admitting: Pulmonary Disease

## 2021-01-02 LAB — CYTOLOGY - NON PAP

## 2021-01-02 NOTE — Telephone Encounter (Signed)
Called and spoke with pt's spouse Annie Main stating to him at pt's upcoming appt with BI 12/28, he will go over all bronch results during that appt and he verbalized understanding. Annie Main wanted to still go ahead and check to see if any results were back and also he stated that prior to the bronch,they were told by BI that there was a chance that they might have had to go two different places while doing the bronch to get biopsies done. He wants to know if that had to be done or if they were able to get all they needed from just one side.  Routing to Dr. Valeta Harms. Please advise.

## 2021-01-02 NOTE — Telephone Encounter (Signed)
Icard, Bradley L, DO  You; [facility] Triage Pool 16 minutes ago (10:03 AM)   Pathology results are not yet back. I only went after one location for the biopsy. I believe we were able to establish an answer in the room. We are waiting on the final path for report.   Thanks   BLI     Called and spoke with Annie Main letting him know info per Dr. Valeta Harms and he verbalized understanding. Nothing further needed.

## 2021-01-06 NOTE — Interval H&P Note (Signed)
History and Physical Interval Note:  01/06/2021 7:21 AM  Dana Jackson  has presented today for surgery, with the diagnosis of lung nodule.  The various methods of treatment have been discussed with the patient and family. After consideration of risks, benefits and other options for treatment, the patient has consented to  Procedure(s) with comments: ROBOTIC ASSISTED NAVIGATIONAL BRONCHOSCOPY (Left) - ION w/ CIOS BRONCHIAL BIOPSIES BRONCHIAL NEEDLE ASPIRATION BIOPSIES BRONCHIAL BRUSHINGS RADIAL ENDOBRONCHIAL ULTRASOUND as a surgical intervention.  The patient's history has been reviewed, patient examined, no change in status, stable for surgery.  I have reviewed the patient's chart and labs.  Questions were answered to the patient's satisfaction.     [place]

## 2021-01-07 ENCOUNTER — Other Ambulatory Visit: Payer: Self-pay

## 2021-01-07 ENCOUNTER — Encounter: Payer: Self-pay | Admitting: Pulmonary Disease

## 2021-01-07 ENCOUNTER — Ambulatory Visit (INDEPENDENT_AMBULATORY_CARE_PROVIDER_SITE_OTHER): Payer: Medicare PPO | Admitting: Pulmonary Disease

## 2021-01-07 VITALS — BP 118/62 | HR 70 | Temp 97.8°F | Ht 60.0 in | Wt 146.2 lb

## 2021-01-07 DIAGNOSIS — D3A090 Benign carcinoid tumor of the bronchus and lung: Secondary | ICD-10-CM | POA: Diagnosis present

## 2021-01-07 DIAGNOSIS — Z8542 Personal history of malignant neoplasm of other parts of uterus: Secondary | ICD-10-CM | POA: Diagnosis present

## 2021-01-07 DIAGNOSIS — R918 Other nonspecific abnormal finding of lung field: Secondary | ICD-10-CM

## 2021-01-07 DIAGNOSIS — Z85528 Personal history of other malignant neoplasm of kidney: Secondary | ICD-10-CM

## 2021-01-07 DIAGNOSIS — Z789 Other specified health status: Secondary | ICD-10-CM

## 2021-01-07 NOTE — Patient Instructions (Signed)
Thank you for visiting Dr. Valeta Harms at [facility]. Today we recommend the following:  Orders Placed This Encounter  Procedures   Procedural/ Surgical Case Request: ROBOTIC ASSISTED NAVIGATIONAL BRONCHOSCOPY   CT Super D Chest Wo Contrast   Ambulatory referral to Pulmonology   Bronchoscopy date on 02/03/2021 Please arrange hemodialysis like last time .  Return in about 5 weeks (around 02/11/2021) for with [name], NP, or Dr. Valeta Harms.    Please do your part to reduce the spread of COVID-19.

## 2021-01-07 NOTE — Progress Notes (Signed)
Synopsis: Referred in December 2022 for lung nodules by Heywood Bene, *  Subjective:   PATIENT ID: Dana Jackson GENDER: female DOB: [birth year], MRN: [id number]  Chief Complaint  Patient presents with   Follow-up    Patient is here to talk about results.     PMH Renal cell carcinoma in 2010 s/p nephrectomy, endometrial cancer w/ hysterectomy, chemo and radiation. Now undersurveilance imaging. Patient is a life long non-smoker.  Patient had a CT scan of the chest that was completed on11/16/2022.  This CT scan of the chest has a new 5.4 mm left upper lobe perihilar nodule on the anterior portion a 3 mm nodule in the left lower lobe and a pre-existing left lower lobe nodule that is now larger and a 6 mm right middle lobe nodule.  There is a small single right paratracheal lymph node that is larger than previous.  These images due to the patient's history of renal cell carcinoma was concerning for metastatic disease and recurrence.  Patient's surgical oncologist was contacted from [facility] by primary care.  They spoke with Dr. Clarene Essex.  He recommended a biopsy of the largest lesion.  Patient was discussed with [facility] radiology from interventional and recommended referral for evaluation of bronchoscopic tissue biopsy.  Patient here today to discuss next steps.  OV 01/07/2021: Here today for follow-up regarding bronchoscopy results.  Patient was taken forRobotic assisted navigational bronchoscopy on 12/30/2020.  Patient's pathology from the left lower lobe lung nodule was consistent with neuroendocrine tumor, neoplastic cells were TTF-1 positive, CD56, synaptic ficin and chromogranin positive.  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  Today we reviewed her pathology results.  From respiratory standpoint she is doing well.  Unfortunately this did not clarify what the nodule is present on the right side.  I am thankful that the left lower  lobe lesion is not metastatic renal cell.   Past Medical History:  Diagnosis Date   Anemia    Arthritis    hands   Asthma    childhood   Bacteremia due to Escherichia coli 06/2013   Currently being treated with anti-biotics   CAD in native artery 12/2010   a) Cath for exertional angina & EKG changes: 40% LM, 80% mid LAD.  95% ost cX, 80-90% PDA --> CABG X3; b) Post CABG CATH for + Myoview with basal anterior ischemia -> Ost LAD 80% (diffuse) then 100% after SP2, RI 70% (too small for PCI), Ost-prox Cx 99% & OM1 90%, OstrPDA 80% (small); Occluded SVG-rPDA.  Patent LIMA-dLAD, SVG-OM: culprit ~ p-m LAD pre-LIMA & RI - not good PCI target --> Med Rx   CKD (chronic kidney disease) stage 3, GFR 30-59 ml/min (HCC)    Degenerative disc disease, cervical    Degenerative disc disease, cervical [722.4]   Diabetes mellitus without complication (HCC)    Type II   Dyspnea    Endometrial cancer ([place]) 11/2013   Treated with TAH with pelvic lymphadenectomy followed by radiation and chemotherapy   GERD (gastroesophageal reflux disease)    Hearing loss    bilateral - no hearing aids   Heart murmur    History of asthma     childhood   History of blood transfusion    History of pneumonia    "2-3 times"   History of unstable angina November/December 2012   T wave inversions in inferolateral leads.  No stress test performed.   Hyperlipidemia LDL goal <  70    Hypertension, essential, benign    Neuropathy    hands   Pneumonia 2018   Renal cell carcinoma ([place]) 11/18/2008   T2aNx s/p partial left nephrectomy   S/P CABG x 3 12/2010   LIMA-LAD, SVG RPL, SVG-Circumflex   Stroke ([place])    TIA- no residual    TIA (transient ischemic attack) 1992 &  2010     Family History  Problem Relation Age of Onset   AAA (abdominal aortic aneurysm) Mother    Multiple myeloma Mother    GER disease Mother    Coronary artery disease Father    Heart disease Father    Coronary artery disease Sister    Coronary  artery disease Brother    Heart disease Brother    Stroke Maternal Grandmother    Heart attack Neg Hx      Past Surgical History:  Procedure Laterality Date   ANTERIOR CERVICAL DECOMP/DISCECTOMY FUSION  10/11/2005   multi-level   AV FISTULA PLACEMENT Left 08/03/2017   Procedure: ARTERIOVENOUS (AV) FISTULA CREATION LEFT ARM;  Surgeon: Rosetta Posner, MD;  Location: [place];  Service: Vascular;  Laterality: Left;   BRONCHIAL BIOPSY  12/30/2020   Procedure: BRONCHIAL BIOPSIES;  Surgeon: Garner Nash, DO;  Location: [facility];  Service: Pulmonary;;   BRONCHIAL BRUSHINGS  12/30/2020   Procedure: BRONCHIAL BRUSHINGS;  Surgeon: Garner Nash, DO;  Location: [place];  Service: Pulmonary;;   BRONCHIAL NEEDLE ASPIRATION BIOPSY  12/30/2020   Procedure: BRONCHIAL NEEDLE ASPIRATION BIOPSIES;  Surgeon: Garner Nash, DO;  [facility];  Service: Pulmonary;;   CARDIAC CATHETERIZATION  12/24/10   40% left main, 80% mid LAD, 95% ostial circumflex, 80-90% PDA.   CARDIAC CATHETERIZATION N/A 07/01/2014   Procedure: Left Heart Cath and Cors/Grafts Angiography;  Surgeon: Leonie Man, MD;  [facility]: [facility] IN[facility] CV LAB:  For Abn Nuc @ [facility]: Ost LAD 80%, mid LAD 100% after S/P 2, 70% RI (no PCI target), ost-prox Cx 99%, OM1 90%. Ost rPDA 80% (~ pre-CABG), occluded SVG-rPDA, patent LIMA-dLAD, SVG OM; potential culprit for abn Nuc scan = pLAD Dz, small RI 70% or PDA -> med Rx   CAROTID ENDARTERECTOMY Left    CHOLECYSTECTOMY  ~ [place] GRAFT  12/25/2010   Procedure: CORONARY ARTERY BYPASS GRAFTING (CABGX3 - LIMA-LAD, SVG RPL, SVG-Circumflex);  Surgeon: Rexene Alberts, MD;  Location: [place];  Service: Open Heart Surgery;  Laterality: N/A;   DILATATION & CURETTAGE/HYSTEROSCOPY WITH MYOSURE N/A 11/02/2013   Procedure: DILATATION & CURETTAGE/HYSTEROSCOPY WITH MYOSURE ABLATION;  Surgeon: Allena Katz, MD;  Location: [facility];  Service: Gynecology;   Laterality: N/A;   DOPPLER ECHOCARDIOGRAPHY  12/08/2010   EF =>55%,MILD CONCENTRIC LEFT VENTRICULAR HYPERTROPHY   EYE SURGERY Bilateral    Cataract   IR DIALY SHUNT INTRO NEEDLE/INTRACATH INITIAL W/IMG LEFT Left 04/24/2019   IR FLUORO GUIDE CV LINE LEFT  04/26/2019   IR US GUIDE VASC ACCESS LEFT  04/24/2019   IR US GUIDE VASC ACCESS LEFT  04/26/2019   LEFT HEART CATHETERIZATION WITH CORONARY ANGIOGRAM N/A 12/24/2010   Procedure: LEFT HEART CATHETERIZATION WITH CORONARY ANGIOGRAM;  Surgeon: Leonie Man, MD;  Location: [facility] CATH LAB;  Service: Cardiovascular;  Laterality: N/A;   NM MYOCAR SINGLE W/SPECT  07/26/2007   EF 79%, LEFT VENT.FUNCTION NORMAL   PARTIAL NEPHRECTOMY Left 11/18/2008   left partial nephrectomy for renal cell CA  PET Myocardial Perfusion Scan  06/13/2014   At [place]: Moderate size, mild severity completely reversible defect involving the basal anterior, mid anterior and apical anterior segments consistent with ischemia. EF 65% with normal global function.   TONSILLECTOMY     "in college"   TOTAL ABDOMINAL HYSTERECTOMY  November 2015    At [facility]: Robotic procedure with pelvic lymphadenectomy   TRANSTHORACIC ECHOCARDIOGRAM  06/13/2014   At [place]: EF 60-65%. Grade 1 diastolic dysfunction. Mild MR. Aortic sclerosis. 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  -- Currently being very busy taking care of 2 foster children that are staying with her daughter.  A 53-year-old and a 38-year-old that they're trying to adopt.  She is very excited about the possibility of becoming a Grandmother.   She does walk and getting exercise, but she is wanting to get back into more activities just because she has really been limited due to her arthritic pains. She used to do things like walking and biking, and she may try to do some biking again, or at least stationary biking.    Does not smoke, does not drink.   Social Determinants of Health   Financial Resource Strain: Not on file  Food Insecurity: Not on file  Transportation Needs: Not on file  Physical Activity: Not on file  Stress: Not on file  Social Connections: Not on file  Intimate Partner Violence: Not on file     Allergies  Allergen Reactions   Sulfa Antibiotics Other (See Comments)    "AKI"    Erythromycin Itching   Pravastatin Other (See Comments)    Unknown reaction   Simvastatin Other (See Comments)    Reaction not known   Codeine Nausea Only and Other (See Comments)    Tolerate Hycodan   Gemfibrozil Itching     Outpatient Medications Prior to Visit  Medication Sig Dispense Refill   acetaminophen (TYLENOL) 500 MG tablet Take 1,000 mg by mouth every 6 (six) hours as needed for moderate pain.     Alpha-Lipoic Acid 600 MG CAPS Take 600 mg by mouth daily.     aspirin 81 MG tablet Take 81 mg by mouth at bedtime.     benzonatate (TESSALON) 100 MG capsule Take 100-200 mg by mouth 3  (three) times daily as needed  for cough.     cinacalcet (SENSIPAR) 30 MG tablet TAKE 1 TABLET BY MOUTH THREE  DAYS (TIMES) A WEEK  AFTER DIALYSIS     cyanocobalamin (,VITAMIN B-12,) 1000 MCG/ML injection Inject 1 mL (1,000 mcg total) into the muscle every 30 (thirty) days for 90 doses. 3 mL 29   Darbepoetin Alfa (ARANESP) 60 MCG/0.3ML SOSY injection Inject 0.3 mLs (60 mcg total) into the vein every Tuesday with hemodialysis. 4.2 mL    estradiol (ESTRACE) 0.1 MG/GM vaginal cream Place 1 Applicatorful vaginally 3 (three) times a week.     fluticasone (FLONASE) 50 MCG/ACT nasal spray Place 1 spray into both nostrils daily as needed for allergies or rhinitis.     furosemide (LASIX) 20 MG tablet Take 20 mg by mouth 2 (two) times a week. Tues and Saturday night     gabapentin (NEURONTIN) 100 MG capsule Take 100 mg by mouth 3 (three) times daily.      isosorbide mononitrate (IMDUR) 60 MG 24 hr tablet TAKE 1 TABLET BY MOUTH EVERY DAY 90 tablet 3   lidocaine-prilocaine (EMLA) cream APPLY SMALL AMOUNT TO ACCESS SITE (AVF) 3 TIMES A WEEK 1 HOUR BEFORE DIALYSIS. COVER WITH OCCLUSIVE DRESSING (SARAN WRAP)     loperamide (IMODIUM A-D) 2 MG tablet Take 4 mg by mouth as needed for diarrhea or loose stools.     loratadine (CLARITIN) 10 MG tablet Take 10 mg by mouth daily.     magnesium oxide (MAG-OX) 400 MG tablet Take 400 mg by mouth daily.     Methoxy PEG-Epoetin Beta (MIRCERA IJ) Mircera     metoprolol tartrate (LOPRESSOR) 50 MG tablet TAKE 1 TABLET BY MOUTH TWICE A DAY 180 tablet 2   nitroGLYCERIN (NITROSTAT) 0.4 MG SL tablet Place 1 tablet (0.4 mg total) under the tongue every 5 (five) minutes as needed for chest pain. 25 tablet 11   Omega-3 Fatty Acids (FISH OIL) 1200 MG CAPS Take 2,400 mg by mouth in the morning and at bedtime.     omeprazole (PRILOSEC) 40 MG capsule Take 40 mg by mouth daily before breakfast.     Probiotic Product (ALIGN) 4 MG CAPS Take 1 capsule by mouth daily.     pyridOXINE (VITAMIN  B-6) 100 MG tablet Take 200 mg by mouth daily.      sevelamer carbonate (RENVELA) 800 MG tablet Take 800 mg by mouth See admin instructions. Take 800 mg with each meal and each snack     sodium chloride (OCEAN) 0.65 % SOLN nasal spray Place 1 spray into both nostrils as needed for congestion.     atorvastatin (LIPITOR) 80 MG tablet Take 1 tablet (80 mg total) by mouth at bedtime. 90 tablet 0   famotidine (PEPCID) 20 MG tablet Take 1 tablet (20 mg total) by mouth at bedtime. 30 tablet 2   No facility-administered medications prior to visit.    Review of Systems  Constitutional:  Negative for chills, fever, malaise/fatigue and weight loss.  HENT:  Negative for hearing loss, sore throat and tinnitus.   Eyes:  Negative for blurred vision and double vision.  Respiratory:  Negative for cough, hemoptysis, sputum production, shortness of breath, wheezing and stridor.   Cardiovascular:  Negative for chest pain, palpitations, orthopnea, leg swelling and PND.  Gastrointestinal:  Negative for abdominal pain, constipation, diarrhea, heartburn, nausea and vomiting.  Genitourinary:  Negative for dysuria, hematuria and urgency.  Musculoskeletal:  Negative for joint pain and myalgias.  Skin:  Negative for itching and rash.  Neurological:  Negative for dizziness, tingling, weakness and headaches.  Endo/Heme/Allergies:  Negative for environmental allergies. Does not bruise/bleed easily.  Psychiatric/Behavioral:  Negative for depression. The patient is not nervous/anxious and does not have insomnia.   All other systems reviewed and are negative.   Objective:  Physical Exam Vitals reviewed.  Constitutional:      General: She is not in acute distress.    Appearance: She is well-developed.  HENT:     Head: Normocephalic and atraumatic.  Eyes:     General: No scleral icterus.    Conjunctiva/sclera: Conjunctivae normal.     Pupils: Pupils are equal, round, and reactive to light.  Neck:     Vascular: No  JVD.     Trachea: No tracheal deviation.  Cardiovascular:     Rate and Rhythm: Normal rate and regular rhythm.     Heart sounds: Normal heart sounds. No murmur heard. Pulmonary:     Effort: Pulmonary effort is normal. No tachypnea, accessory muscle usage or respiratory distress.     Breath sounds: No stridor. No wheezing, rhonchi or rales.  Abdominal:     General: There is no distension.     Palpations: Abdomen is soft.     Tenderness: There is no abdominal tenderness.  Musculoskeletal:        General: No tenderness.     Cervical back: Neck supple.  Lymphadenopathy:     Cervical: No cervical adenopathy.  Skin:    General: Skin is warm and dry.     Capillary Refill: Capillary refill takes less than 2 seconds.     Findings: No rash.  Neurological:     Mental Status: She is alert and oriented to person, place, and time.  Psychiatric:        Behavior: Behavior normal.     Vitals:   01/07/21 0905  BP: 118/62  Pulse: 70  Temp: 97.8 F (36.6 C)  TempSrc: Oral  SpO2: 96%  Weight: 146 lb 3.2 oz (66.3 kg)  Height: 5' (1.524 m)    96% on RA BMI Readings from Last 3 Encounters:  01/07/21 28.55 kg/m  12/30/20 28.32 kg/m  12/12/20 28.12 kg/m   Wt Readings from Last 3 Encounters:  01/07/21 146 lb 3.2 oz (66.3 kg)  12/30/20 145 lb (65.8 kg)  12/12/20 144 lb (65.3 kg)     CBC    Component Value Date/Time   WBC 8.9 04/26/2020 2330   RBC 3.36 (L) 04/26/2020 2330   HGB 9.9 (L) 12/30/2020 1112   HCT 29.0 (L) 12/30/2020 1112   PLT 140 (L) 04/26/2020 2330   MCV 99.7 04/26/2020 2330   MCH 33.3 04/26/2020 2330   MCHC 33.4 04/26/2020 2330   RDW 14.2 04/26/2020 2330   LYMPHSABS 1.4 03/30/2019 0718   MONOABS 0.6 03/30/2019 0718   EOSABS 0.3 03/30/2019 0718   BASOSABS 0.0 03/30/2019 0718     Chest Imaging: 11/27/2020 CT chest: Reviewed images today in the office with patient.  Multiple small pulmonary nodules 1 of which has slowly enlarged over time concerning for  metastatic disease with a history of renal cell cancer. The patient's images have been independently reviewed by me.    Pulmonary Functions Testing Results: No flowsheet data found.  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 Lung nodules  R91.8 Procedural/ Surgical Case Request: ROBOTIC ASSISTED NAVIGATIONAL BRONCHOSCOPY    Ambulatory referral to Pulmonology    CT Super D Chest Wo Contrast    3. History of renal cell cancer  Z85.528 Procedural/ Surgical Case Request: ROBOTIC ASSISTED NAVIGATIONAL BRONCHOSCOPY    4. History of endometrial cancer  Z85.42 Procedural/ Surgical Case Request: ROBOTIC ASSISTED NAVIGATIONAL BRONCHOSCOPY    5. Non-smoker  Z78.9        Discussion:  This is a 75 year old female, history of nephrectomy for renal cell cancer in 2010, endometrial cancer with hysterectomy status post chemo and radiation.  Multiple pulmonary nodules within the chest that have slowly enlarged.  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  This procedure thankfully did not show that she had metastatic renal cancer however we did not biopsy the more high risk and smaller lesion adjacent to the pericardium.  This leaves the question as to whether or not this lesion represents metastatic renal, endometrial or another neuroendocrine tumor.  Plan: Therefore today after further discussion we we will plan to move forward with a repeat navigational bronchoscopy and tissue sampling of the right-sided lesion. I did not go after the right-sided lesion during her last case as we were expecting a potential  for metastatic disease which confirmation in the left lower lobe would have not changed management. Unfortunately this was not the case we are thankful that she does not have metastatic renal cell however it still does not answer whether or not what type of lesion is present in the right side. Therefore I think she needs to undergo a repeat biopsy of the right lesion. Patient and husband are in agreements. We will plan for a repeat navigational bronchoscopy on 02/04/2020. She will work with dialysis for that week.  She will work with her dialysis center to arrange her dialysis for that week.   Return to clinic approximately 1 week after bronchoscopy to discuss results.    Current Outpatient Medications:    acetaminophen (TYLENOL) 500 MG tablet, Take 1,000 mg by mouth every 6 (six) hours as needed for moderate pain., Disp: , Rfl:    Alpha-Lipoic Acid 600 MG CAPS, Take 600 mg by mouth daily., Disp: , Rfl:    aspirin 81 MG tablet, Take 81 mg by mouth at bedtime., Disp: , Rfl:    benzonatate (TESSALON) 100 MG capsule, Take 100-200 mg by mouth 3 (three) times daily as needed for cough., Disp: , Rfl:    cinacalcet (SENSIPAR) 30 MG tablet, TAKE 1 TABLET BY MOUTH THREE  DAYS (TIMES) A WEEK  AFTER DIALYSIS, Disp: , Rfl:    cyanocobalamin (,VITAMIN B-12,) 1000 MCG/ML injection, Inject 1 mL (1,000 mcg total) into the muscle every 30 (thirty) days for 90 doses., Disp: 3 mL, Rfl: 29   Darbepoetin Alfa (ARANESP) 60 MCG/0.3ML SOSY injection, Inject 0.3 mLs (60 mcg total) into the vein every Tuesday with hemodialysis., Disp: 4.2 mL, Rfl:    estradiol (ESTRACE) 0.1 MG/GM vaginal cream, Place 1 Applicatorful vaginally 3 (three) times a week., Disp: , Rfl:    fluticasone (FLONASE) 50 MCG/ACT nasal spray, Place 1 spray into both nostrils daily as needed for allergies or rhinitis., Disp: , Rfl:    furosemide (LASIX) 20 MG tablet, Take 20 mg by mouth 2 (two) times a week. Tues and Saturday night, Disp: , Rfl:  gabapentin (NEURONTIN) 100 MG capsule, Take 100 mg by mouth 3 (three) times daily. , Disp: , Rfl:    isosorbide mononitrate (IMDUR) 60 MG 24 hr tablet, TAKE 1 TABLET BY MOUTH EVERY DAY, Disp: 90 tablet, Rfl: 3   lidocaine-prilocaine (EMLA) cream, APPLY SMALL AMOUNT TO ACCESS SITE (AVF) 3 TIMES A WEEK 1 HOUR BEFORE DIALYSIS. COVER WITH OCCLUSIVE DRESSING (SARAN WRAP), Disp: , Rfl:    loperamide (IMODIUM A-D) 2 MG tablet, Take 4 mg by mouth as needed for diarrhea or loose stools., Disp: , Rfl:    loratadine (CLARITIN) 10 MG tablet, Take 10 mg by mouth daily., Disp: , Rfl:    magnesium oxide (MAG-OX) 400 MG tablet, Take 400 mg by mouth daily., Disp: , Rfl:    Methoxy PEG-Epoetin Beta (MIRCERA IJ), Mircera, Disp: , Rfl:    metoprolol tartrate (LOPRESSOR) 50 MG tablet, TAKE 1 TABLET BY MOUTH TWICE A DAY, Disp: 180 tablet, Rfl: 2   nitroGLYCERIN (NITROSTAT) 0.4 MG SL tablet, Place 1 tablet (0.4 mg total) under the tongue every 5 (five) minutes as needed for chest pain., Disp: 25 tablet, Rfl: 11   Omega-3 Fatty Acids (FISH OIL) 1200 MG CAPS, Take 2,400 mg by mouth in the morning and at bedtime., Disp: , Rfl:    omeprazole (PRILOSEC) 40 MG capsule, Take 40 mg by mouth daily before breakfast., Disp: , Rfl:    Probiotic Product (ALIGN) 4 MG CAPS, Take 1 capsule by mouth daily., Disp: , Rfl:    pyridOXINE (VITAMIN B-6) 100 MG tablet, Take 200 mg by mouth daily. , Disp: , Rfl:    sevelamer carbonate (RENVELA) 800 MG tablet, Take 800 mg by mouth See admin instructions. Take 800 mg with each meal and each snack, Disp: , Rfl:    sodium chloride (OCEAN) 0.65 % SOLN nasal spray, Place 1 spray into both nostrils as needed for congestion., Disp: , Rfl:    atorvastatin (LIPITOR) 80 MG tablet, Take 1 tablet (80 mg total) by mouth at bedtime., Disp: 90 tablet, Rfl: 0   famotidine (PEPCID) 20 MG tablet, Take 1 tablet (20 mg total) by mouth at bedtime., Disp: 30 tablet, Rfl: 2    Garner Nash, DO [facility] 01/07/2021 9:42 AM

## 2021-01-07 NOTE — H&P (View-Only) (Signed)
Synopsis: Referred in December 2022 for lung nodules by Heywood Bene, *  Subjective:   PATIENT ID: Dana Jackson GENDER: female DOB: [birth year], MRN: [id number]  Chief Complaint  Patient presents with   Follow-up    Patient is here to talk about results.     PMH Renal cell carcinoma in 2010 s/p nephrectomy, endometrial cancer w/ hysterectomy, chemo and radiation. Now undersurveilance imaging. Patient is a life long non-smoker.  Patient had a CT scan of the chest that was completed on11/16/2022.  This CT scan of the chest has a new 5.4 mm left upper lobe perihilar nodule on the anterior portion a 3 mm nodule in the left lower lobe and a pre-existing left lower lobe nodule that is now larger and a 6 mm right middle lobe nodule.  There is a small single right paratracheal lymph node that is larger than previous.  These images due to the patient's history of renal cell carcinoma was concerning for metastatic disease and recurrence.  Patient's surgical oncologist was contacted from [facility] by primary care.  They spoke with Dr. Clarene Essex.  He recommended a biopsy of the largest lesion.  Patient was discussed with [facility] radiology from interventional and recommended referral for evaluation of bronchoscopic tissue biopsy.  Patient here today to discuss next steps.  OV 01/07/2021: Here today for follow-up regarding bronchoscopy results.  Patient was taken forRobotic assisted navigational bronchoscopy on 12/30/2020.  Patient's pathology from the left lower lobe lung nodule was consistent with neuroendocrine tumor, neoplastic cells were TTF-1 positive, CD56, synaptic ficin and chromogranin positive.  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  Today we reviewed her pathology results.  From respiratory standpoint she is doing well.  Unfortunately this did not clarify what the nodule is present on the right side.  I am thankful that the left lower  lobe lesion is not metastatic renal cell.   Past Medical History:  Diagnosis Date   Anemia    Arthritis    hands   Asthma    childhood   Bacteremia due to Escherichia coli 06/2013   Currently being treated with anti-biotics   CAD in native artery 12/2010   a) Cath for exertional angina & EKG changes: 40% LM, 80% mid LAD.  95% ost cX, 80-90% PDA --> CABG X3; b) Post CABG CATH for + Myoview with basal anterior ischemia -> Ost LAD 80% (diffuse) then 100% after SP2, RI 70% (too small for PCI), Ost-prox Cx 99% & OM1 90%, OstrPDA 80% (small); Occluded SVG-rPDA.  Patent LIMA-dLAD, SVG-OM: culprit ~ p-m LAD pre-LIMA & RI - not good PCI target --> Med Rx   CKD (chronic kidney disease) stage 3, GFR 30-59 ml/min (HCC)    Degenerative disc disease, cervical    Degenerative disc disease, cervical [722.4]   Diabetes mellitus without complication (HCC)    Type II   Dyspnea    Endometrial cancer ([place]) 11/2013   Treated with TAH with pelvic lymphadenectomy followed by radiation and chemotherapy   GERD (gastroesophageal reflux disease)    Hearing loss    bilateral - no hearing aids   Heart murmur    History of asthma     childhood   History of blood transfusion    History of pneumonia    "2-3 times"   History of unstable angina November/December 2012   T wave inversions in inferolateral leads.  No stress test performed.   Hyperlipidemia LDL goal <  70    Hypertension, essential, benign    Neuropathy    hands   Pneumonia 2018   Renal cell carcinoma ([place]) 11/18/2008   T2aNx s/p partial left nephrectomy   S/P CABG x 3 12/2010   LIMA-LAD, SVG RPL, SVG-Circumflex   Stroke ([place])    TIA- no residual    TIA (transient ischemic attack) 1992 &  2010     Family History  Problem Relation Age of Onset   AAA (abdominal aortic aneurysm) Mother    Multiple myeloma Mother    GER disease Mother    Coronary artery disease Father    Heart disease Father    Coronary artery disease Sister    Coronary  artery disease Brother    Heart disease Brother    Stroke Maternal Grandmother    Heart attack Neg Hx      Past Surgical History:  Procedure Laterality Date   ANTERIOR CERVICAL DECOMP/DISCECTOMY FUSION  10/11/2005   multi-level   AV FISTULA PLACEMENT Left 08/03/2017   Procedure: ARTERIOVENOUS (AV) FISTULA CREATION LEFT ARM;  Surgeon: Rosetta Posner, MD;  Location: [place];  Service: Vascular;  Laterality: Left;   BRONCHIAL BIOPSY  12/30/2020   Procedure: BRONCHIAL BIOPSIES;  Surgeon: Garner Nash, DO;  Location: [facility];  Service: Pulmonary;;   BRONCHIAL BRUSHINGS  12/30/2020   Procedure: BRONCHIAL BRUSHINGS;  Surgeon: Garner Nash, DO;  Location: [place];  Service: Pulmonary;;   BRONCHIAL NEEDLE ASPIRATION BIOPSY  12/30/2020   Procedure: BRONCHIAL NEEDLE ASPIRATION BIOPSIES;  Surgeon: Garner Nash, DO;  [facility]: [facility];  Service: Pulmonary;;   CARDIAC CATHETERIZATION  12/24/10   40% left main, 80% mid LAD, 95% ostial circumflex, 80-90% PDA.   CARDIAC CATHETERIZATION N/A 07/01/2014   Procedure: Left Heart Cath and Cors/Grafts Angiography;  Surgeon: Leonie Man, MD;  [facility] LAB:  For Abn Nuc @ [facility]: Ost LAD 80%, mid LAD 100% after S/P 2, 70% RI (no PCI target), ost-prox Cx 99%, OM1 90%. Ost rPDA 80% (~ pre-CABG), occluded SVG-rPDA, patent LIMA-dLAD, SVG OM; potential culprit for abn Nuc scan = pLAD Dz, small RI 70% or PDA -> med Rx   CAROTID ENDARTERECTOMY Left    CHOLECYSTECTOMY  ~ [place] GRAFT  12/25/2010   Procedure: CORONARY ARTERY BYPASS GRAFTING (CABGX3 - LIMA-LAD, SVG RPL, SVG-Circumflex);  Surgeon: Rexene Alberts, MD;  Location: [place];  Service: Open Heart Surgery;  Laterality: N/A;   DILATATION & CURETTAGE/HYSTEROSCOPY WITH MYOSURE N/A 11/02/2013   Procedure: DILATATION & CURETTAGE/HYSTEROSCOPY WITH MYOSURE ABLATION;  Surgeon: Allena Katz, MD;  Location: [facility];  Service: Gynecology;   Laterality: N/A;   DOPPLER ECHOCARDIOGRAPHY  12/08/2010   EF =>55%,MILD CONCENTRIC LEFT VENTRICULAR HYPERTROPHY   EYE SURGERY Bilateral    Cataract   IR DIALY SHUNT INTRO NEEDLE/INTRACATH INITIAL W/IMG LEFT Left 04/24/2019   IR FLUORO GUIDE CV LINE LEFT  04/26/2019   IR US GUIDE VASC ACCESS LEFT  04/24/2019   IR US GUIDE VASC ACCESS LEFT  04/26/2019   LEFT HEART CATHETERIZATION WITH CORONARY ANGIOGRAM N/A 12/24/2010   Procedure: LEFT HEART CATHETERIZATION WITH CORONARY ANGIOGRAM;  Surgeon: Leonie Man, MD;  Location: [facility] CATH LAB;  Service: Cardiovascular;  Laterality: N/A;   NM MYOCAR SINGLE W/SPECT  07/26/2007   EF 79%, LEFT VENT.FUNCTION NORMAL   PARTIAL NEPHRECTOMY Left 11/18/2008   left partial nephrectomy for renal cell CA  06/13/2014  ° At [facility]: Moderate size, mild severity completely reversible defect involving the basal anterior, mid anterior and apical anterior segments consistent with ischemia. EF 65% with normal global function.  ° TONSILLECTOMY    ° "in college"  ° TOTAL ABDOMINAL HYSTERECTOMY  November 2015   ° At [facility]: Robotic procedure with pelvic lymphadenectomy  ° TRANSTHORACIC ECHOCARDIOGRAM  06/13/2014  ° At [facility]: EF 60-65%. Grade 1 diastolic dysfunction. Mild MR. Aortic sclerosis. Moderate pulmonary hypertension  ° TUBAL LIGATION  ~ 1984  ° UPPER GI ENDOSCOPY    ° VIDEO BRONCHOSCOPY WITH RADIAL ENDOBRONCHIAL ULTRASOUND  12/30/2020  ° Procedure: RADIAL ENDOBRONCHIAL ULTRASOUND;  Surgeon: Anaih Brander L, DO;  [facility];  Service: Pulmonary;;  ° WISDOM TOOTH EXTRACTION    ° ° °Social History  ° °Socioeconomic History  ° Marital status: Married  °  Spouse name: Steve  ° Number of children: 2  ° Years of education: many  ° Highest education level: Not on file  °Occupational History  ° Occupation: grade school teacher  °  Employer: [employer]  °  Comment: retired 2003  °  Employer: RETIRED  °Tobacco  Use  ° Smoking status: Never  ° Smokeless tobacco: Never  °Vaping Use  ° Vaping Use: Never used  °Substance and Sexual Activity  ° Alcohol use: Not Currently  °  Alcohol/week: 0.0 standard drinks  ° Drug use: No  ° Sexual activity: Yes  °  Partners: Male  °  Birth control/protection: Post-menopausal  °  Comment: husband  °Other Topics Concern  ° Not on file  °Social History Narrative  ° She is a married Mother of 2.  -- Currently being very busy taking care of 2 foster children that are staying with her daughter.  A 9-year-old and a 1-year-old that they're trying to adopt.  She is very excited about the possibility of becoming a Grandmother.  ° She does walk and getting exercise, but she is wanting to get back into more activities just because she has really been limited due to her arthritic pains. She used to do things like walking and biking, and she may try to do some biking again, or at least stationary biking.   ° Does not smoke, does not drink.  ° °Social Determinants of Health  ° °Financial Resource Strain: Not on file  °Food Insecurity: Not on file  °Transportation Needs: Not on file  °Physical Activity: Not on file  °Stress: Not on file  °Social Connections: Not on file  °Intimate Partner Violence: Not on file  °  ° °Allergies  °Allergen Reactions  ° Sulfa Antibiotics Other (See Comments)  °  "AKI" °  ° Erythromycin Itching  ° Pravastatin Other (See Comments)  °  Unknown reaction  ° Simvastatin Other (See Comments)  °  Reaction not known  ° Codeine Nausea Only and Other (See Comments)  °  Tolerate Hycodan  ° Gemfibrozil Itching  °  ° °Outpatient Medications Prior to Visit  °Medication Sig Dispense Refill  ° acetaminophen (TYLENOL) 500 MG tablet Take 1,000 mg by mouth every 6 (six) hours as needed for moderate pain.    ° Alpha-Lipoic Acid 600 MG CAPS Take 600 mg by mouth daily.    ° aspirin 81 MG tablet Take 81 mg by mouth at bedtime.    ° benzonatate (TESSALON) 100 MG capsule Take 100-200 mg by mouth 3  (three) times daily as needed for cough.    °   for cough.     cinacalcet (SENSIPAR) 30 MG tablet TAKE 1 TABLET BY MOUTH THREE  DAYS (TIMES) A WEEK  AFTER DIALYSIS     cyanocobalamin (,VITAMIN B-12,) 1000 MCG/ML injection Inject 1 mL (1,000 mcg total) into the muscle every 30 (thirty) days for 90 doses. 3 mL 29   Darbepoetin Alfa (ARANESP) 60 MCG/0.3ML SOSY injection Inject 0.3 mLs (60 mcg total) into the vein every Tuesday with hemodialysis. 4.2 mL    estradiol (ESTRACE) 0.1 MG/GM vaginal cream Place 1 Applicatorful vaginally 3 (three) times a week.     fluticasone (FLONASE) 50 MCG/ACT nasal spray Place 1 spray into both nostrils daily as needed for allergies or rhinitis.     furosemide (LASIX) 20 MG tablet Take 20 mg by mouth 2 (two) times a week. Tues and Saturday night     gabapentin (NEURONTIN) 100 MG capsule Take 100 mg by mouth 3 (three) times daily.      isosorbide mononitrate (IMDUR) 60 MG 24 hr tablet TAKE 1 TABLET BY MOUTH EVERY DAY 90 tablet 3   lidocaine-prilocaine (EMLA) cream APPLY SMALL AMOUNT TO ACCESS SITE (AVF) 3 TIMES A WEEK 1 HOUR BEFORE DIALYSIS. COVER WITH OCCLUSIVE DRESSING (SARAN WRAP)     loperamide (IMODIUM A-D) 2 MG tablet Take 4 mg by mouth as needed for diarrhea or loose stools.     loratadine (CLARITIN) 10 MG tablet Take 10 mg by mouth daily.     magnesium oxide (MAG-OX) 400 MG tablet Take 400 mg by mouth daily.     Methoxy PEG-Epoetin Beta (MIRCERA IJ) Mircera     metoprolol tartrate (LOPRESSOR) 50 MG tablet TAKE 1 TABLET BY MOUTH TWICE A DAY 180 tablet 2   nitroGLYCERIN (NITROSTAT) 0.4 MG SL tablet Place 1 tablet (0.4 mg total) under the tongue every 5 (five) minutes as needed for chest pain. 25 tablet 11   Omega-3 Fatty Acids (FISH OIL) 1200 MG CAPS Take 2,400 mg by mouth in the morning and at bedtime.     omeprazole (PRILOSEC) 40 MG capsule Take 40 mg by mouth daily before breakfast.     Probiotic Product (ALIGN) 4 MG CAPS Take 1 capsule by mouth daily.     pyridOXINE (VITAMIN  B-6) 100 MG tablet Take 200 mg by mouth daily.      sevelamer carbonate (RENVELA) 800 MG tablet Take 800 mg by mouth See admin instructions. Take 800 mg with each meal and each snack     sodium chloride (OCEAN) 0.65 % SOLN nasal spray Place 1 spray into both nostrils as needed for congestion.     atorvastatin (LIPITOR) 80 MG tablet Take 1 tablet (80 mg total) by mouth at bedtime. 90 tablet 0   famotidine (PEPCID) 20 MG tablet Take 1 tablet (20 mg total) by mouth at bedtime. 30 tablet 2   No facility-administered medications prior to visit.    Review of Systems  Constitutional:  Negative for chills, fever, malaise/fatigue and weight loss.  HENT:  Negative for hearing loss, sore throat and tinnitus.   Eyes:  Negative for blurred vision and double vision.  Respiratory:  Negative for cough, hemoptysis, sputum production, shortness of breath, wheezing and stridor.   Cardiovascular:  Negative for chest pain, palpitations, orthopnea, leg swelling and PND.  Gastrointestinal:  Negative for abdominal pain, constipation, diarrhea, heartburn, nausea and vomiting.  Genitourinary:  Negative for dysuria, hematuria and urgency.  Musculoskeletal:  Negative for joint pain and myalgias.  Skin:  Negative for itching and rash.  tingling, weakness and headaches.  °Endo/Heme/Allergies:  Negative for environmental allergies. Does not bruise/bleed easily.  °Psychiatric/Behavioral:  Negative for depression. The patient is not nervous/anxious and does not have insomnia.   °All other systems reviewed and are negative. ° ° °Objective:  °Physical Exam °Vitals reviewed.  °Constitutional:   °   General: She is not in acute distress. °   Appearance: She is well-developed.  °HENT:  °   Head: Normocephalic and atraumatic.  °Eyes:  °   General: No scleral icterus. °   Conjunctiva/sclera: Conjunctivae normal.  °   Pupils: Pupils are equal, round, and reactive to light.  °Neck:  °   Vascular: No  JVD.  °   Trachea: No tracheal deviation.  °Cardiovascular:  °   Rate and Rhythm: Normal rate and regular rhythm.  °   Heart sounds: Normal heart sounds. No murmur heard. °Pulmonary:  °   Effort: Pulmonary effort is normal. No tachypnea, accessory muscle usage or respiratory distress.  °   Breath sounds: No stridor. No wheezing, rhonchi or rales.  °Abdominal:  °   General: There is no distension.  °   Palpations: Abdomen is soft.  °   Tenderness: There is no abdominal tenderness.  °Musculoskeletal:     °   General: No tenderness.  °   Cervical back: Neck supple.  °Lymphadenopathy:  °   Cervical: No cervical adenopathy.  °Skin: °   General: Skin is warm and dry.  °   Capillary Refill: Capillary refill takes less than 2 seconds.  °   Findings: No rash.  °Neurological:  °   Mental Status: She is alert and oriented to person, place, and time.  °Psychiatric:     °   Behavior: Behavior normal.  ° ° ° °Vitals:  ° 01/07/21 0905  °BP: 118/62  °Pulse: 70  °Temp: 97.8 °F (36.6 °C)  °TempSrc: Oral  °SpO2: 96%  °Weight: 146 lb 3.2 oz (66.3 kg)  °Height: 5' (1.524 m)  ° ° °96% on RA °BMI Readings from Last 3 Encounters:  °01/07/21 28.55 kg/m²  °12/30/20 28.32 kg/m²  °12/12/20 28.12 kg/m²  ° °Wt Readings from Last 3 Encounters:  °01/07/21 146 lb 3.2 oz (66.3 kg)  °12/30/20 145 lb (65.8 kg)  °12/12/20 144 lb (65.3 kg)  ° ° ° °CBC °   °Component Value Date/Time  ° WBC 8.9 04/26/2020 2330  ° RBC 3.36 (L) 04/26/2020 2330  ° HGB 9.9 (L) 12/30/2020 1112  ° HCT 29.0 (L) 12/30/2020 1112  ° PLT 140 (L) 04/26/2020 2330  ° MCV 99.7 04/26/2020 2330  ° MCH 33.3 04/26/2020 2330  ° MCHC 33.4 04/26/2020 2330  ° RDW 14.2 04/26/2020 2330  ° LYMPHSABS 1.4 03/30/2019 0718  ° MONOABS 0.6 03/30/2019 0718  ° EOSABS 0.3 03/30/2019 0718  ° BASOSABS 0.0 03/30/2019 0718  ° ° ° °Chest Imaging: °11/27/2020 CT chest: °Reviewed images today in the office with patient.  Multiple small pulmonary nodules 1 of which has slowly enlarged over time concerning for  metastatic disease with a history of renal cell cancer. °The patient's images have been independently reviewed by me.   ° °Pulmonary Functions Testing Results: °No flowsheet data found. 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 Lung nodules  R91.8 Procedural/ Surgical Case Request: ROBOTIC ASSISTED NAVIGATIONAL BRONCHOSCOPY  °  Ambulatory referral to Pulmonology  °  CT Super D Chest Wo Contrast  °  °3. History of renal cell cancer  Z85.528 Procedural/ Surgical Case Request: ROBOTIC ASSISTED NAVIGATIONAL BRONCHOSCOPY  °  °4. History of endometrial cancer  Z85.42 Procedural/ Surgical Case Request: ROBOTIC ASSISTED NAVIGATIONAL BRONCHOSCOPY  °  °5. Non-smoker  Z78.9   °  ° ° ° °Discussion: ° °This is a 75-year-old female, history of nephrectomy for renal cell cancer in 2010, endometrial cancer with hysterectomy status post chemo and radiation.  Multiple pulmonary nodules within the chest that have slowly enlarged.  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  This procedure thankfully did not show that she had metastatic renal cancer however we did not biopsy the more high risk and smaller lesion adjacent to the pericardium.  This leaves the question as to whether or not this lesion represents metastatic renal, endometrial or another neuroendocrine tumor. ° °Plan: °Therefore today after further discussion we we will plan to move forward with a repeat navigational bronchoscopy and tissue sampling of the right-sided lesion. °I did not go after the right-sided lesion during her last case as we were expecting a potential  for metastatic disease which confirmation in the left lower lobe would have not changed management. °Unfortunately this was not the case we are thankful that she does not have metastatic renal cell however it still does not answer whether or not what type of lesion is present in the right side. °Therefore I think she needs to undergo a repeat biopsy of the right lesion. °Patient and husband are in agreements. °We will plan for a repeat navigational bronchoscopy on 02/04/2020. °She will work with dialysis for that week.  She will work with her dialysis center to arrange her dialysis for that week.   °Return to clinic approximately 1 week after bronchoscopy to discuss results. ° ° ° °Current Outpatient Medications:  °  acetaminophen (TYLENOL) 500 MG tablet, Take 1,000 mg by mouth every 6 (six) hours as needed for moderate pain., Disp: , Rfl:  °  Alpha-Lipoic Acid 600 MG CAPS, Take 600 mg by mouth daily., Disp: , Rfl:  °  aspirin 81 MG tablet, Take 81 mg by mouth at bedtime., Disp: , Rfl:  °  benzonatate (TESSALON) 100 MG capsule, Take 100-200 mg by mouth 3 (three) times daily as needed for cough., Disp: , Rfl:  °  cinacalcet (SENSIPAR) 30 MG tablet, TAKE 1 TABLET BY MOUTH THREE  DAYS (TIMES) A WEEK  AFTER DIALYSIS, Disp: , Rfl:  °  cyanocobalamin (,VITAMIN B-12,) 1000 MCG/ML injection, Inject 1 mL (1,000 mcg total) into the muscle every 30 (thirty) days for 90 doses., Disp: 3 mL, Rfl: 29 °  Darbepoetin Alfa (ARANESP) 60 MCG/0.3ML SOSY injection, Inject 0.3 mLs (60 mcg total) into the vein every Tuesday with hemodialysis., Disp: 4.2 mL, Rfl:  °  estradiol (ESTRACE) 0.1 MG/GM vaginal cream, Place 1 Applicatorful vaginally 3 (three) times a week., Disp: , Rfl:  °  fluticasone (FLONASE) 50 MCG/ACT nasal spray, Place 1 spray into both nostrils daily as needed for allergies or rhinitis., Disp: , Rfl:  °  furosemide (LASIX) 20 MG tablet, Take 20 mg by mouth 2 (two) times a week. Tues and Saturday night, Disp: , Rfl:  °   gabapentin (NEURONTIN)   gabapentin (NEURONTIN) 100 MG capsule, Take 100 mg by mouth 3 (three) times daily. , Disp: , Rfl:    isosorbide mononitrate (IMDUR) 60 MG 24 hr tablet, TAKE 1 TABLET BY MOUTH EVERY DAY, Disp: 90 tablet, Rfl: 3   lidocaine-prilocaine (EMLA) cream, APPLY SMALL AMOUNT TO ACCESS SITE (AVF) 3 TIMES A WEEK 1 HOUR BEFORE DIALYSIS. COVER WITH OCCLUSIVE DRESSING (SARAN WRAP), Disp: , Rfl:    loperamide (IMODIUM A-D) 2 MG tablet, Take 4 mg by mouth as needed for diarrhea or loose stools., Disp: , Rfl:    loratadine (CLARITIN) 10 MG tablet, Take 10 mg by mouth daily., Disp: , Rfl:    magnesium oxide (MAG-OX) 400 MG tablet, Take 400 mg by mouth daily., Disp: , Rfl:    Methoxy PEG-Epoetin Beta (MIRCERA IJ), Mircera, Disp: , Rfl:    metoprolol tartrate (LOPRESSOR) 50 MG tablet, TAKE 1 TABLET BY MOUTH TWICE A DAY, Disp: 180 tablet, Rfl: 2   nitroGLYCERIN (NITROSTAT) 0.4 MG SL tablet, Place 1 tablet (0.4 mg total) under the tongue every 5 (five) minutes as needed for chest pain., Disp: 25 tablet, Rfl: 11   Omega-3 Fatty Acids (FISH OIL) 1200 MG CAPS, Take 2,400 mg by mouth in the morning and at bedtime., Disp: , Rfl:    omeprazole (PRILOSEC) 40 MG capsule, Take 40 mg by mouth daily before breakfast., Disp: , Rfl:    Probiotic Product (ALIGN) 4 MG CAPS, Take 1 capsule by mouth daily., Disp: , Rfl:    pyridOXINE (VITAMIN B-6) 100 MG tablet, Take 200 mg by mouth daily. , Disp: , Rfl:    sevelamer carbonate (RENVELA) 800 MG tablet, Take 800 mg by mouth See admin instructions. Take 800 mg with each meal and each snack, Disp: , Rfl:    sodium chloride (OCEAN) 0.65 % SOLN nasal spray, Place 1 spray into both nostrils as needed for congestion., Disp: , Rfl:    atorvastatin (LIPITOR) 80 MG tablet, Take 1 tablet (80 mg total) by mouth at bedtime., Disp: 90 tablet, Rfl: 0   famotidine (PEPCID) 20 MG tablet, Take 1 tablet (20 mg total) by mouth at bedtime., Disp: 30 tablet, Rfl: 2    Garner Nash, DO [facility] 01/07/2021 9:42 AM

## 2021-01-30 ENCOUNTER — Ambulatory Visit (INDEPENDENT_AMBULATORY_CARE_PROVIDER_SITE_OTHER)
Admission: RE | Admit: 2021-01-30 | Discharge: 2021-01-30 | Disposition: A | Discharge status: Home / Self Care | Payer: Medicare PPO | Source: Ambulatory Visit | Attending: Pulmonary Disease | Admitting: Pulmonary Disease

## 2021-01-30 ENCOUNTER — Other Ambulatory Visit: Payer: Self-pay

## 2021-01-30 ENCOUNTER — Other Ambulatory Visit: Payer: Self-pay | Admitting: Pulmonary Disease

## 2021-01-30 DIAGNOSIS — R918 Other nonspecific abnormal finding of lung field: Secondary | ICD-10-CM | POA: Diagnosis not present

## 2021-01-30 LAB — SARS CORONAVIRUS 2 (TAT 6-24 HRS): SARS Coronavirus 2: NEGATIVE

## 2021-01-31 ENCOUNTER — Other Ambulatory Visit: Payer: Self-pay | Admitting: Cardiology

## 2021-02-01 ENCOUNTER — Encounter (HOSPITAL_COMMUNITY): Payer: Self-pay | Admitting: Pulmonary Disease

## 2021-02-01 NOTE — Progress Notes (Signed)
PCP - Donnamarie Rossetti williams PA @ [facility] Cardiologist Ellyn Hack EKG - 04/26/20 Chest x-ray - 12/30/20 1 view ECHO - 03/28/19 Cardiac Cath - 6/16 CPAP - na Fasting Blood Sugar:  107 Checks Blood Sugar:  1/day  Aspirin Instructions: last dose 02/01/20  COVID TEST- 01-30-21  Dialysis Tu-Thur-Sat  (pt. Will go to dialysis  on 02/03/20) [place] [facility] on [address].  Anesthesia review: yes-cardiac hx/ rena lhx  -------------  SDW INSTRUCTIONS:  Your procedure is scheduled on 02-04-24. Please report to [facility] Main Entrance "A" at 0530 A.M., and check in at the Admitting office. Call this number if you have problems the morning of surgery: [phone]   Remember: Do not eat or drink after midnight the night before your surgery     Medications to take morning of surgery with a sip of water include: Gabapentin,imdur,claritin, metoprolol,omeprazole, Prn: tylenol,tessolon,flonase nitro,phenergan  As of today, STOP taking any Aspirin (unless otherwise instructed by your surgeon), Aleve, Naproxen, Ibuprofen, Motrin, Advil, Goody's, BC's, all herbal medications, fish oil, and all vitamins.     How to Manage Your Diabetes Before and After Surgery  Why is it important to control my blood sugar before and after surgery? Improving blood sugar levels before and after surgery helps healing and can limit problems. A way of improving blood sugar control is eating a healthy diet by:  Eating less sugar and carbohydrates  Increasing activity/exercise  Talking with your doctor about reaching your blood sugar goals High blood sugars (greater than 180 mg/dL) can raise your risk of infections and slow your recovery, so you will need to focus on controlling your diabetes during the weeks before surgery. Make sure that the doctor who takes care of your diabetes knows about your planned surgery including the date and location.  How do I manage my blood sugar before  surgery? Check your blood sugar at least 4 times a day, starting 2 days before surgery, to make sure that the level is not too high or low. Check your blood sugar the morning of your surgery when you wake up and every 2 hours until you get to the Short Stay unit. If your blood sugar is less than 70 mg/dL, you will need to treat for low blood sugar: Do not take insulin. Treat a low blood sugar (less than 70 mg/dL) with  cup of clear juice (cranberry or apple), 4 glucose tablets, OR glucose gel. Recheck blood sugar in 15 minutes after treatment (to make sure it is greater than 70 mg/dL). If your blood sugar is not greater than 70 mg/dL on recheck, call [phone]  for further instructions. Report your blood sugar to the short stay nurse when you get to Short Stay.  If you are admitted to the hospital after surgery: Your blood sugar will be checked by the staff and you will probably be given insulin after surgery (instead of oral diabetes medicines) to make sure you have good blood sugar levels. The goal for blood sugar control after surgery is 80-180 mg/dL.     The Morning of Surgery Do not wear jewelry, make-up or nail polish. Do not wear lotions, powders, or perfumes/colognes, or deodorant Do not bring valuables to the hospital. [facility] is not responsible for any belongings or valuables.  If you are a smoker, DO NOT Smoke 24 hours prior to surgery  If you wear a CPAP at night please bring your mask the morning of surgery   Remember that you must  have someone to transport you home after your surgery, and remain with you for 24 hours if you are discharged the same day.  Please bring cases for contacts, glasses, hearing aids, dentures or bridgework because it cannot be worn into surgery.   Patients discharged the day of surgery will not be allowed to drive home.   Please shower the NIGHT BEFORE/MORNING OF SURGERY (use antibacterial soap like DIAL soap if possible). Wear  comfortable clothes the morning of surgery. Oral Hygiene is also important to reduce your risk of infection.  Remember - BRUSH YOUR TEETH THE MORNING OF SURGERY WITH YOUR REGULAR TOOTHPASTE  Patient denies shortness of breath, fever, cough and chest pain.

## 2021-02-03 ENCOUNTER — Ambulatory Visit (HOSPITAL_COMMUNITY)
Admission: RE | Admit: 2021-02-03 | Discharge: 2021-02-03 | Disposition: A | Discharge status: Home / Self Care | Payer: Medicare PPO | Attending: Pulmonary Disease | Admitting: Pulmonary Disease

## 2021-02-03 ENCOUNTER — Ambulatory Visit (HOSPITAL_COMMUNITY): Payer: Medicare PPO

## 2021-02-03 ENCOUNTER — Ambulatory Visit (HOSPITAL_COMMUNITY): Payer: Medicare PPO | Admitting: Physician Assistant

## 2021-02-03 ENCOUNTER — Encounter (HOSPITAL_COMMUNITY): Payer: Self-pay | Admitting: Pulmonary Disease

## 2021-02-03 ENCOUNTER — Encounter (HOSPITAL_COMMUNITY)
Admission: RE | Disposition: A | Discharge status: Home / Self Care | Payer: Self-pay | Source: Home / Self Care | Attending: Pulmonary Disease

## 2021-02-03 ENCOUNTER — Other Ambulatory Visit: Payer: Self-pay

## 2021-02-03 DIAGNOSIS — Z8542 Personal history of malignant neoplasm of other parts of uterus: Secondary | ICD-10-CM

## 2021-02-03 DIAGNOSIS — I120 Hypertensive chronic kidney disease with stage 5 chronic kidney disease or end stage renal disease: Secondary | ICD-10-CM | POA: Insufficient documentation

## 2021-02-03 DIAGNOSIS — N186 End stage renal disease: Secondary | ICD-10-CM | POA: Diagnosis not present

## 2021-02-03 DIAGNOSIS — Z85528 Personal history of other malignant neoplasm of kidney: Secondary | ICD-10-CM | POA: Diagnosis not present

## 2021-02-03 DIAGNOSIS — Z905 Acquired absence of kidney: Secondary | ICD-10-CM | POA: Diagnosis not present

## 2021-02-03 DIAGNOSIS — Z951 Presence of aortocoronary bypass graft: Secondary | ICD-10-CM | POA: Insufficient documentation

## 2021-02-03 DIAGNOSIS — Z992 Dependence on renal dialysis: Secondary | ICD-10-CM | POA: Insufficient documentation

## 2021-02-03 DIAGNOSIS — I2510 Atherosclerotic heart disease of native coronary artery without angina pectoris: Secondary | ICD-10-CM | POA: Diagnosis not present

## 2021-02-03 DIAGNOSIS — D3A090 Benign carcinoid tumor of the bronchus and lung: Secondary | ICD-10-CM | POA: Diagnosis present

## 2021-02-03 DIAGNOSIS — E1122 Type 2 diabetes mellitus with diabetic chronic kidney disease: Secondary | ICD-10-CM | POA: Diagnosis not present

## 2021-02-03 DIAGNOSIS — I700 Atherosclerosis of aorta: Secondary | ICD-10-CM | POA: Diagnosis not present

## 2021-02-03 DIAGNOSIS — R918 Other nonspecific abnormal finding of lung field: Secondary | ICD-10-CM | POA: Diagnosis present

## 2021-02-03 DIAGNOSIS — K219 Gastro-esophageal reflux disease without esophagitis: Secondary | ICD-10-CM | POA: Diagnosis not present

## 2021-02-03 DIAGNOSIS — Z79899 Other long term (current) drug therapy: Secondary | ICD-10-CM | POA: Insufficient documentation

## 2021-02-03 DIAGNOSIS — Z9889 Other specified postprocedural states: Secondary | ICD-10-CM

## 2021-02-03 DIAGNOSIS — J45909 Unspecified asthma, uncomplicated: Secondary | ICD-10-CM | POA: Diagnosis not present

## 2021-02-03 DIAGNOSIS — M1990 Unspecified osteoarthritis, unspecified site: Secondary | ICD-10-CM | POA: Insufficient documentation

## 2021-02-03 DIAGNOSIS — Z419 Encounter for procedure for purposes other than remedying health state, unspecified: Secondary | ICD-10-CM

## 2021-02-03 HISTORY — PX: BRONCHIAL NEEDLE ASPIRATION BIOPSY: SHX5106

## 2021-02-03 HISTORY — PX: BRONCHIAL BIOPSY: SHX5109

## 2021-02-03 HISTORY — PX: BRONCHIAL BRUSHINGS: SHX5108

## 2021-02-03 HISTORY — PX: VIDEO BRONCHOSCOPY WITH RADIAL ENDOBRONCHIAL ULTRASOUND: SHX6849

## 2021-02-03 LAB — POCT I-STAT, CHEM 8
BUN: 18 mg/dL (ref 8–23)
Calcium, Ion: 1.23 mmol/L (ref 1.15–1.40)
Chloride: 98 mmol/L (ref 98–111)
Creatinine, Ser: 3.4 mg/dL — ABNORMAL HIGH (ref 0.44–1.00)
Glucose, Bld: 116 mg/dL — ABNORMAL HIGH (ref 70–99)
HCT: 31 % — ABNORMAL LOW (ref 36.0–46.0)
Hemoglobin: 10.5 g/dL — ABNORMAL LOW (ref 12.0–15.0)
Potassium: 4.3 mmol/L (ref 3.5–5.1)
Sodium: 136 mmol/L (ref 135–145)
TCO2: 31 mmol/L (ref 22–32)

## 2021-02-03 LAB — GLUCOSE, CAPILLARY
Glucose-Capillary: 118 mg/dL — ABNORMAL HIGH (ref 70–99)
Glucose-Capillary: 115 mg/dL — ABNORMAL HIGH (ref 70–99)

## 2021-02-03 SURGERY — BRONCHOSCOPY, WITH BIOPSY USING ELECTROMAGNETIC NAVIGATION
Anesthesia: General | Laterality: Left

## 2021-02-03 MED ORDER — ONDANSETRON HCL 4 MG/2ML IJ SOLN
INTRAMUSCULAR | Status: DC | PRN
Start: 2021-02-03 — End: 2021-02-03
  Administered 2021-02-03: 4 mg via INTRAVENOUS

## 2021-02-03 MED ORDER — FENTANYL CITRATE (PF) 100 MCG/2ML IJ SOLN: 25.0000 ug | INTRAMUSCULAR | Status: DC | PRN

## 2021-02-03 MED ORDER — CHLORHEXIDINE GLUCONATE 0.12 % MT SOLN
OROMUCOSAL | Status: AC
  Filled 2021-02-03: qty 15

## 2021-02-03 MED ORDER — PROPOFOL 10 MG/ML IV BOLUS
INTRAVENOUS | Status: DC | PRN
  Administered 2021-02-03: 150 mg via INTRAVENOUS

## 2021-02-03 MED ORDER — SUGAMMADEX SODIUM 200 MG/2ML IV SOLN
INTRAVENOUS | Status: DC | PRN
  Administered 2021-02-03: 200 mg via INTRAVENOUS

## 2021-02-03 MED ORDER — LIDOCAINE 2% (20 MG/ML) 5 ML SYRINGE
INTRAMUSCULAR | Status: DC | PRN
  Administered 2021-02-03: 60 mg via INTRAVENOUS

## 2021-02-03 MED ORDER — EPHEDRINE SULFATE-NACL 50-0.9 MG/10ML-% IV SOSY
PREFILLED_SYRINGE | INTRAVENOUS | Status: DC | PRN
  Administered 2021-02-03: 10 mg via INTRAVENOUS

## 2021-02-03 MED ORDER — PHENYLEPHRINE 40 MCG/ML (10ML) SYRINGE FOR IV PUSH (FOR BLOOD PRESSURE SUPPORT)
PREFILLED_SYRINGE | INTRAVENOUS | Status: DC | PRN
  Administered 2021-02-03: 160 ug via INTRAVENOUS
  Administered 2021-02-03: 80 ug via INTRAVENOUS

## 2021-02-03 MED ORDER — ROCURONIUM BROMIDE 10 MG/ML (PF) SYRINGE
PREFILLED_SYRINGE | INTRAVENOUS | Status: DC | PRN
  Administered 2021-02-03: 40 mg via INTRAVENOUS

## 2021-02-03 MED ORDER — ACETAMINOPHEN 10 MG/ML IV SOLN: 1000.0000 mg | Freq: Once | INTRAVENOUS | Status: DC | PRN

## 2021-02-03 MED ORDER — PHENYLEPHRINE HCL-NACL 20-0.9 MG/250ML-% IV SOLN
INTRAVENOUS | Status: DC | PRN
  Administered 2021-02-03: 40 ug/min via INTRAVENOUS

## 2021-02-03 MED ORDER — LACTATED RINGERS IV SOLN: INTRAVENOUS | Status: DC

## 2021-02-03 MED ORDER — FENTANYL CITRATE (PF) 250 MCG/5ML IJ SOLN
INTRAMUSCULAR | Status: DC | PRN
  Administered 2021-02-03: 100 ug via INTRAVENOUS

## 2021-02-03 NOTE — Op Note (Signed)
Video Bronchoscopy with Robotic Assisted Bronchoscopic Navigation   Date of Operation: 02/03/2021   Pre-op Diagnosis: lung nodules   Post-op Diagnosis: lung nodules   Surgeon: Garner Nash, DO   Assistants: None   Anesthesia: General endotracheal anesthesia  Operation: Flexible video fiberoptic bronchoscopy with robotic assistance and biopsies.  Estimated Blood Loss: Minimal  Complications: None  Indications and History: Dana Jackson is a 76 y.o. female with history of renal cell carcinoma, endometrial cancer, multiple pulmonary nodules. The risks, benefits, complications, treatment options and expected outcomes were discussed with the patient.  The possibilities of pneumothorax, pneumonia, reaction to medication, pulmonary aspiration, perforation of a viscus, bleeding, failure to diagnose a condition and creating a complication requiring transfusion or operation were discussed with the patient who freely signed the consent.    Description of Procedure: The patient was seen in the Preoperative Area, was examined and was deemed appropriate to proceed.  The patient was taken to [facility] endoscopy room 3, identified as Dana Jackson and the procedure verified as Flexible Video Fiberoptic Bronchoscopy.  A Time Out was held and the above information confirmed.   Prior to the date of the procedure a high-resolution CT scan of the chest was performed. Utilizing ION software program a virtual tracheobronchial tree was generated to allow the creation of distinct navigation pathways to the patient's parenchymal abnormalities. After being taken to the operating room general anesthesia was initiated and the patient  was orally intubated. The video fiberoptic bronchoscope was introduced via the endotracheal tube and a general inspection was performed which showed normal right and left lung anatomy, aspiration of the bilateral mainstems was completed to remove any remaining secretions. Robotic catheter  inserted into patient's endotracheal tube.   Target #1 right middle lobe pulmonary nodule: The distinct navigation pathways prepared prior to this procedure were then utilized to navigate to patient's lesion identified on CT scan. The robotic catheter was secured into place and the vision probe was withdrawn.  Lesion location was approximated using fluoroscopy, three-dimensional cone beam CT imaging, and radial endobronchial ultrasound for peripheral targeting. Under fluoroscopic guidance transbronchial needle brushings, transbronchial needle biopsies, and transbronchial forceps biopsies were performed to be sent for cytology and pathology.  At the end of the procedure a general airway inspection was performed and there was no evidence of active bleeding. The bronchoscope was removed.  The patient tolerated the procedure well. There was no significant blood loss and there were no obvious complications. A post-procedural chest x-ray is pending.  Samples Target #1: 1. Transbronchial needle brushings from right middle lobe 2. Transbronchial Wang needle biopsies from right middle lobe 3. Transbronchial forceps biopsies from right middle lobe  Plans:  The patient will be discharged from the PACU to home when recovered from anesthesia and after chest x-ray is reviewed. We will review the cytology, pathology results with the patient when they become available. Outpatient followup will be with Garner Nash, DO.    Garner Nash, DO [facility] 02/03/2021 8:39 AM

## 2021-02-03 NOTE — Discharge Instructions (Signed)
Flexible Bronchoscopy, Care After This sheet gives you information about how to care for yourself after your test. Your doctor may also give you more specific instructions. If you have problems or questions, contact your doctor. Follow these instructions at home: Eating and drinking Do not eat or drink anything (not even water) for 2 hours after your test, or until your numbing medicine (local anesthetic) wears off. When your numbness is gone and your cough and gag reflexes have come back, you may: Eat only soft foods. Slowly drink liquids. The day after the test, go back to your normal diet. Driving Do not drive for 24 hours if you were given a medicine to help you relax (sedative). Do not drive or use heavy machinery while taking prescription pain medicine. General instructions  Take over-the-counter and prescription medicines only as told by your doctor. Return to your normal activities as told. Ask what activities are safe for you. Do not use any products that have nicotine or tobacco in them. This includes cigarettes and e-cigarettes. If you need help quitting, ask your doctor. Keep all follow-up visits as told by your doctor. This is important. It is very important if you had a tissue sample (biopsy) taken. Get help right away if: You have shortness of breath that gets worse. You get light-headed. You feel like you are going to pass out (faint). You have chest pain. You cough up: More than a little blood. More blood than before. Summary Do not eat or drink anything (not even water) for 2 hours after your test, or until your numbing medicine wears off. Do not use cigarettes. Do not use e-cigarettes. Get help right away if you have chest pain.  This information is not intended to replace advice given to you by your health care provider. Make sure you discuss any questions you have with your health care provider. Document Released: 10/25/2008 Document Revised: 12/10/2016 Document  Reviewed: 01/16/2016 Elsevier Patient Education  2020 [employer].

## 2021-02-03 NOTE — Transfer of Care (Signed)
Immediate Anesthesia Transfer of Care Note  Patient: Dana Jackson  Procedure(s) Performed: ROBOTIC ASSISTED NAVIGATIONAL BRONCHOSCOPY (Left) RADIAL ENDOBRONCHIAL ULTRASOUND BRONCHIAL BIOPSIES BRONCHIAL BRUSHINGS BRONCHIAL NEEDLE ASPIRATION BIOPSIES  Patient Location: PACU  Anesthesia Type:General  Level of Consciousness: drowsy  Airway & Oxygen Therapy: Patient Spontanous Breathing and Patient connected to face mask oxygen  Post-op Assessment: Report given to RN and Post -op Vital signs reviewed and stable  Post vital signs: Reviewed and stable  Last Vitals:  Vitals Value Taken Time  BP 164/63 02/03/21 0851  Temp    Pulse 70 02/03/21 0853  Resp 24 02/03/21 0853  SpO2 100 % 02/03/21 0853  Vitals shown include unvalidated device data.  Last Pain:  Vitals:   02/03/21 0656  TempSrc:   PainSc: 0-No pain         Complications: No notable events documented.

## 2021-02-03 NOTE — Anesthesia Preprocedure Evaluation (Addendum)
Anesthesia Evaluation  Patient identified by MRN, date of birth, ID band Patient awake    Reviewed: Allergy & Precautions, NPO status , Patient's Chart, lab work & pertinent test results  Airway Mallampati: II  TM Distance: >3 FB Neck ROM: Full    Dental no notable dental hx.    Pulmonary asthma ,  Carcinoid tumor   Pulmonary exam normal        Cardiovascular hypertension, Pt. on medications and Pt. on home beta blockers + CAD and + CABG (2012)   Rhythm:Regular Rate:Normal     Neuro/Psych TIACVA negative psych ROS   GI/Hepatic GERD  Medicated,  Endo/Other  diabetes, Type 2  Renal/GU ESRF and DialysisRenal diseaseS/p nephrectomy 2010 for RCC  negative genitourinary   Musculoskeletal  (+) Arthritis , Osteoarthritis,    Abdominal Normal abdominal exam  (+)   Peds  Hematology  (+) anemia ,   Anesthesia Other Findings   Reproductive/Obstetrics                             Anesthesia Physical Anesthesia Plan  ASA: 3  Anesthesia Plan: General   Post-op Pain Management:    Induction: Intravenous  PONV Risk Score and Plan: 3 and Ondansetron, Dexamethasone and Treatment may vary due to age or medical condition  Airway Management Planned: Mask and Oral ETT  Additional Equipment: None  Intra-op Plan:   Post-operative Plan: Extubation in OR  Informed Consent: I have reviewed the patients History and Physical, chart, labs and discussed the procedure including the risks, benefits and alternatives for the proposed anesthesia with the patient or authorized representative who has indicated his/her understanding and acceptance.     Dental advisory given  Plan Discussed with: CRNA  Anesthesia Plan Comments: (Lab Results      Component                Value               Date                      WBC                      8.9                 04/26/2020                HGB                       10.5 (L)            02/03/2021                HCT                      31.0 (L)            02/03/2021                MCV                      99.7                04/26/2020                PLT  140 (L)             04/26/2020           Lab Results      Component                Value               Date                      NA                       136                 02/03/2021                K                        4.3                 02/03/2021                CO2                      24                  04/26/2020                GLUCOSE                  116 (H)             02/03/2021                BUN                      18                  02/03/2021                CREATININE               3.40 (H)            02/03/2021                CALCIUM                  9.3                 04/26/2020                GFRNONAA                 10 (L)              04/26/2020            1. Left ventricular ejection fraction, by estimation, is 65 to 70%. The  left ventricle has normal function. The left ventricle has no regional  wall motion abnormalities. There is mild left ventricular hypertrophy.  Left ventricular diastolic parameters  are indeterminate.  2. Right ventricule is not well visualized but systolic function appears  grossly normal. The right ventricular size is normal. Tricuspid  regurgitation signal is inadequate for assessing PA pressure.  3. The mitral valve is abnormal. Moderate mitral annular calcification.  Trivial mitral valve regurgitation.  4. The aortic valve is tricuspid. Aortic valve regurgitation is trivial.  Mild  to moderate aortic valve sclerosis/calcification is present, without  any evidence of aortic stenosis.  5. Aortic dilatation noted. 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  6. The inferior vena cava is normal in size with greater than 50%  respiratory variability, suggesting right atrial pressure of 3 mmHg. )         Anesthesia Quick Evaluation

## 2021-02-03 NOTE — Anesthesia Postprocedure Evaluation (Signed)
Anesthesia Post Note  Patient: Danyel Tobey  Procedure(s) Performed: ROBOTIC ASSISTED NAVIGATIONAL BRONCHOSCOPY (Left) RADIAL ENDOBRONCHIAL ULTRASOUND BRONCHIAL BIOPSIES BRONCHIAL BRUSHINGS BRONCHIAL NEEDLE ASPIRATION BIOPSIES     Patient location during evaluation: PACU Anesthesia Type: General Level of consciousness: awake and alert Pain management: pain level controlled Vital Signs Assessment: post-procedure vital signs reviewed and stable Respiratory status: spontaneous breathing, nonlabored ventilation, respiratory function stable and patient connected to nasal cannula oxygen Cardiovascular status: blood pressure returned to baseline and stable Postop Assessment: no apparent nausea or vomiting Anesthetic complications: no   No notable events documented.  Last Vitals:  Vitals:   02/03/21 0906 02/03/21 0921  BP: (!) 131/55 (!) 123/58  Pulse: 69 66  Resp: 13 12  Temp:  36.7 C  SpO2: 94% 94%    Last Pain:  Vitals:   02/03/21 0921  TempSrc:   PainSc: 0-No pain                 March Rummage Kosisochukwu Goldberg

## 2021-02-03 NOTE — Anesthesia Procedure Notes (Signed)
Procedure Name: Intubation Date/Time: 02/03/2021 7:32 AM Performed by: Vonna Drafts, CRNA Pre-anesthesia Checklist: Patient identified, Emergency Drugs available, Suction available and Patient being monitored Patient Re-evaluated:Patient Re-evaluated prior to induction Oxygen Delivery Method: Circle system utilized Preoxygenation: Pre-oxygenation with 100% oxygen Induction Type: IV induction Ventilation: Mask ventilation without difficulty Laryngoscope Size: Glidescope and 4 Grade View: Grade I Tube type: Oral Tube size: 8.5 mm Number of attempts: 1 Airway Equipment and Method: Stylet and Oral airway Placement Confirmation: ETT inserted through vocal cords under direct vision, positive ETCO2 and breath sounds checked- equal and bilateral Secured at: 22 cm Tube secured with: Tape Dental Injury: Teeth and Oropharynx as per pre-operative assessment

## 2021-02-03 NOTE — Interval H&P Note (Signed)
History and Physical Interval Note:  02/03/2021 7:00 AM  Dana Jackson  has presented today for surgery, with the diagnosis of lung nodule.  The various methods of treatment have been discussed with the patient and family. After consideration of risks, benefits and other options for treatment, the patient has consented to  Procedure(s) with comments: ROBOTIC ASSISTED NAVIGATIONAL BRONCHOSCOPY (Left) - ION w/ CIOS as a surgical intervention.  The patient's history has been reviewed, patient examined, no change in status, stable for surgery.  I have reviewed the patient's chart and labs.  Questions were answered to the patient's satisfaction.     [place]

## 2021-02-04 ENCOUNTER — Encounter (HOSPITAL_COMMUNITY): Payer: Self-pay | Admitting: Pulmonary Disease

## 2021-02-05 LAB — CYTOLOGY - NON PAP

## 2021-02-05 NOTE — Progress Notes (Signed)
LMOM for patient to call office back. Call back information provided.

## 2021-02-05 NOTE — Progress Notes (Signed)
Please let patient know I reviewed her pathology results.  Let her know that there is some atypical cells present on the biopsy specimens.  I will talk to her about this at her next office appointment in detail.  We will likely plan for subsequent CT scan follow-up.  Thanks,  BLI  Garner Nash, DO [facility] 02/05/2021 4:00 PM

## 2021-02-06 ENCOUNTER — Telehealth: Payer: Self-pay | Admitting: Pulmonary Disease

## 2021-02-06 NOTE — Telephone Encounter (Signed)
Spoke to patient, who is requesting bx results.   Please call both home and mobile number on file.  Dr. Valeta Harms, please advise. Thanks

## 2021-02-09 NOTE — Telephone Encounter (Signed)
Patient is aware of results and voiced her understanding.  Nothing further needed.   

## 2021-02-18 ENCOUNTER — Other Ambulatory Visit: Payer: Self-pay

## 2021-02-18 ENCOUNTER — Encounter: Payer: Self-pay | Admitting: Pulmonary Disease

## 2021-02-18 ENCOUNTER — Ambulatory Visit: Payer: Medicare PPO | Admitting: Pulmonary Disease

## 2021-02-18 VITALS — BP 138/70 | HR 72 | Temp 97.5°F | Ht 60.0 in | Wt 142.2 lb

## 2021-02-18 DIAGNOSIS — Z85528 Personal history of other malignant neoplasm of kidney: Secondary | ICD-10-CM

## 2021-02-18 DIAGNOSIS — D3A090 Benign carcinoid tumor of the bronchus and lung: Secondary | ICD-10-CM | POA: Diagnosis not present

## 2021-02-18 DIAGNOSIS — Z8542 Personal history of malignant neoplasm of other parts of uterus: Secondary | ICD-10-CM

## 2021-02-18 DIAGNOSIS — Z789 Other specified health status: Secondary | ICD-10-CM

## 2021-02-18 DIAGNOSIS — R918 Other nonspecific abnormal finding of lung field: Secondary | ICD-10-CM

## 2021-02-18 NOTE — Progress Notes (Signed)
Synopsis: Referred in December 2022 for lung nodules by Heywood Bene, *  Subjective:   PATIENT ID: Dana Jackson GENDER: female DOB: [birth year], MRN: [id number]  Chief Complaint  Patient presents with   Follow-up    Patient is doing good, no concerns    PMH Renal cell carcinoma in 2010 s/p nephrectomy, endometrial cancer w/ hysterectomy, chemo and radiation. Now undersurveilance imaging. Patient is a life long non-smoker.  Patient had a CT scan of the chest that was completed on11/16/2022.  This CT scan of the chest has a new 5.4 mm left upper lobe perihilar nodule on the anterior portion a 3 mm nodule in the left lower lobe and a pre-existing left lower lobe nodule that is now larger and a 6 mm right middle lobe nodule.  There is a small single right paratracheal lymph node that is larger than previous.  These images due to the patient's history of renal cell carcinoma was concerning for metastatic disease and recurrence.  Patient's surgical oncologist was contacted from [facility] by primary care.  They spoke with Dr. Clarene Essex.  He recommended a biopsy of the largest lesion.  Patient was discussed with [facility] radiology from interventional and recommended referral for evaluation of bronchoscopic tissue biopsy.  Patient here today to discuss next steps.  OV 01/07/2021: Here today for follow-up regarding bronchoscopy results.  Patient was taken forRobotic assisted navigational bronchoscopy on 12/30/2020.  Patient's pathology from the left lower lobe lung nodule was consistent with neuroendocrine tumor, neoplastic cells were TTF-1 positive, CD56, synaptic ficin and chromogranin positive.  KI-67 shows a low proliferation rate suggestive of a carcinoid or atypical carcinoid.  Today we reviewed her pathology results.  From respiratory standpoint she is doing well.  Unfortunately this did not clarify what the nodule is present on the right side.  I am thankful that the left lower lobe  lesion is not metastatic renal cell.  OV 02/18/2021: Here today for follow-up after recent repeat bronchoscopy.  Patient was taken forRepeat bronchoscopy for evaluation of the right-sided nodule that was not sampled the first go around.  We have got a few atypical cells in this biopsy lesion from the right middle lobe that was not sufficient enough material to confirm malignancy.  It is round and smooth in nature and I do expect this to be another carcinoid however our attempt was to prove whether or not she had recurrent renal cell.  From a respiratory standpoint she is doing fine.   Past Medical History:  Diagnosis Date   Anemia    Arthritis    hands   Asthma    childhood   Bacteremia due to Escherichia coli 06/2013   Currently being treated with anti-biotics   CAD in native artery 12/2010   a) Cath for exertional angina & EKG changes: 40% LM, 80% mid LAD.  95% ost cX, 80-90% PDA --> CABG X3; b) Post CABG CATH for + Myoview with basal anterior ischemia -> Ost LAD 80% (diffuse) then 100% after SP2, RI 70% (too small for PCI), Ost-prox Cx 99% & OM1 90%, OstrPDA 80% (small); Occluded SVG-rPDA.  Patent LIMA-dLAD, SVG-OM: culprit ~ p-m LAD pre-LIMA & RI - not good PCI target --> Med Rx   CKD (chronic kidney disease) stage 3, GFR 30-59 ml/min (HCC)    Degenerative disc disease, cervical    Degenerative disc disease, cervical [722.4]   Diabetes mellitus without complication (HCC)    Type II   Dyspnea  Endometrial cancer ([place]) 11/2013   Treated with TAH with pelvic lymphadenectomy followed by radiation and chemotherapy   GERD (gastroesophageal reflux disease)    Hearing loss    bilateral - no hearing aids   Heart murmur    History of asthma     childhood   History of blood transfusion    History of pneumonia    "2-3 times"   History of unstable angina November/December 2012   T wave inversions in inferolateral leads.  No stress test performed.   Hyperlipidemia LDL goal <70     Hypertension, essential, benign    Neuropathy    hands   Pneumonia 2018   Renal cell carcinoma ([place]) 11/18/2008   T2aNx s/p partial left nephrectomy   S/P CABG x 3 12/2010   LIMA-LAD, SVG RPL, SVG-Circumflex   Stroke ([place])    TIA- no residual    TIA (transient ischemic attack) 1992 &  2010     Family History  Problem Relation Age of Onset   AAA (abdominal aortic aneurysm) Mother    Multiple myeloma Mother    GER disease Mother    Coronary artery disease Father    Heart disease Father    Coronary artery disease Sister    Coronary artery disease Brother    Heart disease Brother    Stroke Maternal Grandmother    Heart attack Neg Hx      Past Surgical History:  Procedure Laterality Date   ANTERIOR CERVICAL DECOMP/DISCECTOMY FUSION  10/11/2005   multi-level   AV FISTULA PLACEMENT Left 08/03/2017   Procedure: ARTERIOVENOUS (AV) FISTULA CREATION LEFT ARM;  Surgeon: Rosetta Posner, MD;  Location: [place];  Service: Vascular;  Laterality: Left;   BRONCHIAL BIOPSY  12/30/2020   Procedure: BRONCHIAL BIOPSIES;  Surgeon: Garner Nash, DO;  Location: [place] [facility];  Service: Pulmonary;;   BRONCHIAL BIOPSY  02/03/2021   Procedure: BRONCHIAL BIOPSIES;  Surgeon: Garner Nash, DO;  Location: [place] [facility];  Service: Pulmonary;;   BRONCHIAL BRUSHINGS  12/30/2020   Procedure: BRONCHIAL BRUSHINGS;  Surgeon: Garner Nash, DO;  Location: [place] [facility];  Service: Pulmonary;;   BRONCHIAL BRUSHINGS  02/03/2021   Procedure: BRONCHIAL BRUSHINGS;  Surgeon: Garner Nash, DO;  Location: [place] [facility];  Service: Pulmonary;;   BRONCHIAL NEEDLE ASPIRATION BIOPSY  12/30/2020   Procedure: BRONCHIAL NEEDLE ASPIRATION BIOPSIES;  Surgeon: Garner Nash, DO;  Location: [place];  Service: Pulmonary;;   BRONCHIAL NEEDLE ASPIRATION BIOPSY  02/03/2021   Procedure: BRONCHIAL NEEDLE ASPIRATION BIOPSIES;  Surgeon: Garner Nash, DO;  Location: [facility];  Service: Pulmonary;;   CARDIAC  CATHETERIZATION  12/24/10   40% left main, 80% mid LAD, 95% ostial circumflex, 80-90% PDA.   CARDIAC CATHETERIZATION N/A 07/01/2014   Procedure: Left Heart Cath and Cors/Grafts Angiography;  Surgeon: Leonie Man, MD;  Location: [facility] CV LAB:  For Abn Nuc @ [facility]: Ost LAD 80%, mid LAD 100% after S/P 2, 70% RI (no PCI target), ost-prox Cx 99%, OM1 90%. Ost rPDA 80% (~ pre-CABG), occluded SVG-rPDA, patent LIMA-dLAD, SVG OM; potential culprit for abn Nuc scan = pLAD Dz, small RI 70% or PDA -> med Rx   CAROTID ENDARTERECTOMY Left    CHOLECYSTECTOMY  ~ [place] GRAFT  12/25/2010   Procedure: CORONARY ARTERY BYPASS GRAFTING (CABGX3 - LIMA-LAD, SVG RPL, SVG-Circumflex);  Surgeon: Rexene Alberts, MD;  Location: [place];  Service: Open Heart Surgery;  Laterality: N/A;   DILATATION & CURETTAGE/HYSTEROSCOPY WITH MYOSURE N/A 11/02/2013   Procedure: DILATATION & CURETTAGE/HYSTEROSCOPY WITH MYOSURE ABLATION;  Surgeon: Allena Katz, MD;  Location: [facility];  Service: Gynecology;  Laterality: N/A;   DOPPLER ECHOCARDIOGRAPHY  12/08/2010   EF =>55%,MILD CONCENTRIC LEFT VENTRICULAR HYPERTROPHY   EYE SURGERY Bilateral    Cataract   IR DIALY SHUNT INTRO NEEDLE/INTRACATH INITIAL W/IMG LEFT Left 04/24/2019   IR FLUORO GUIDE CV LINE LEFT  04/26/2019   IR US GUIDE VASC ACCESS LEFT  04/24/2019   IR US GUIDE VASC ACCESS LEFT  04/26/2019   LEFT HEART CATHETERIZATION WITH CORONARY ANGIOGRAM N/A 12/24/2010   Procedure: LEFT HEART CATHETERIZATION WITH CORONARY ANGIOGRAM;  Surgeon: Leonie Man, MD;  Location: [facility] C[facility] LAB;  Service: Cardiovascular;  Laterality: N/A;   NM MYOCAR SINGLE W/SPECT  07/26/2007   EF 79%, LEFT VENT.FUNCTION NORMAL   PARTIAL NEPHRECTOMY Left 11/18/2008   left partial nephrectomy for renal cell CA   PET Myocardial Perfusion Scan  06/13/2014   At [place]: Moderate size, mild severity completely reversible defect involving the basal anterior, mid  anterior and apical anterior segments consistent with ischemia. EF 65% with normal global function.   TONSILLECTOMY     "in college"   TOTAL ABDOMINAL HYSTERECTOMY  November 2015    At [facility]: Robotic procedure with pelvic lymphadenectomy   TRANSTHORACIC ECHOCARDIOGRAM  06/13/2014   At [place]: EF 60-65%. Grade 1 diastolic dysfunction. Mild MR. Aortic sclerosis. Moderate pulmonary hypertension   TUBAL LIGATION  ~ 1984   UPPER GI ENDOSCOPY     VIDEO BRONCHOSCOPY WITH RADIAL ENDOBRONCHIAL ULTRASOUND  12/30/2020   Procedure: RADIAL ENDOBRONCHIAL ULTRASOUND;  Surgeon: Garner Nash, DO;  Location: [place];  Service: Pulmonary;;   VIDEO BRONCHOSCOPY WITH RADIAL ENDOBRONCHIAL ULTRASOUND  02/03/2021   Procedure: RADIAL ENDOBRONCHIAL ULTRASOUND;  Surgeon: Garner Nash, DO;  Location: [facility];  Service: Pulmonary;;   WISDOM TOOTH EXTRACTION      Social History   Socioeconomic History   Marital status: Married    Spouse name: [name]   Number of children: 2   Years of education: many   Highest education level: Not on file  Occupational History   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  -- Currently being very busy taking care of 2 foster children that are staying with her daughter.  A 87-year-old and a 27-year-old that they're trying to adopt.  She is very excited about the possibility of becoming a Grandmother.   She does walk and getting exercise, but she is wanting to  get back into more activities just because she has really been limited due to her arthritic pains. She used to do things like walking and biking, and she may try to do some biking again, or at least stationary biking.    Does not smoke, does not drink.  Social Determinants of Health   Financial Resource Strain: Not on file  Food Insecurity: Not on file  Transportation Needs: Not on file  Physical Activity: Not on file  Stress: Not on file  Social Connections: Not on file  Intimate Partner Violence: Not on file     Allergies  Allergen Reactions   Sulfa Antibiotics Other (See Comments)    "AKI"    Erythromycin Itching   Pravastatin Other (See Comments)    Unknown reaction   Simvastatin Other (See Comments)    Reaction not known   Codeine Nausea Only and Other (See Comments)    Tolerate Hycodan   Gemfibrozil Itching     Outpatient Medications Prior to Visit  Medication Sig Dispense Refill   acetaminophen (TYLENOL) 500 MG tablet Take 1,000 mg by mouth every 6 (six) hours as needed for moderate pain.     Alpha-Lipoic Acid 600 MG CAPS Take 600 mg by mouth daily.     aspirin 81 MG tablet Take 81 mg by mouth at bedtime.     benzonatate (TESSALON) 100 MG capsule Take 100-200 mg by mouth 3 (three) times daily as needed for cough.     cinacalcet (SENSIPAR) 30 MG tablet TAKE 1 TABLET BY MOUTH THREE  DAYS (TIMES) A WEEK  AFTER DIALYSIS     cyanocobalamin (,VITAMIN B-12,) 1000 MCG/ML injection Inject 1 mL (1,000 mcg total) into the muscle every 30 (thirty) days for 90 doses. 3 mL 29   Darbepoetin Alfa (ARANESP) 60 MCG/0.3ML SOSY injection Inject 0.3 mLs (60 mcg total) into the vein every Tuesday with hemodialysis. 4.2 mL    estradiol (ESTRACE) 0.1 MG/GM vaginal cream Place 1 Applicatorful vaginally 3 (three) times a week.     fluticasone (FLONASE) 50 MCG/ACT nasal spray Place 1 spray into both nostrils daily as needed for allergies or rhinitis.     furosemide (LASIX) 20 MG tablet Take 20  mg by mouth 2 (two) times a week. Tues and Saturday night     gabapentin (NEURONTIN) 100 MG capsule Take 100 mg by mouth 3 (three) times daily.      isosorbide mononitrate (IMDUR) 60 MG 24 hr tablet TAKE 1 TABLET BY MOUTH EVERY DAY 90 tablet 3   lidocaine-prilocaine (EMLA) cream Apply 1 application topically Every Tuesday,Thursday,and Saturday with dialysis.     loperamide (IMODIUM A-D) 2 MG tablet Take 4 mg by mouth as needed for diarrhea or loose stools.     loratadine (CLARITIN) 10 MG tablet Take 10 mg by mouth daily.     magnesium oxide (MAG-OX) 400 MG tablet Take 400 mg by mouth daily.     metoprolol tartrate (LOPRESSOR) 50 MG tablet TAKE 1 TABLET BY MOUTH TWICE A DAY 180 tablet 2   nitroGLYCERIN (NITROSTAT) 0.4 MG SL tablet Place 1 tablet (0.4 mg total) under the tongue every 5 (five) minutes as needed for chest pain. 25 tablet 11   Omega-3 Fatty Acids (FISH OIL) 1200 MG CAPS Take 2,400 mg by mouth in the morning and at bedtime.     omeprazole (PRILOSEC) 40 MG capsule Take 40 mg by mouth daily before breakfast.     Probiotic Product (ALIGN) 4 MG CAPS Take 4 mg by mouth daily.     promethazine (PHENERGAN) 12.5 MG tablet Take 12.5 mg by mouth every 6 (six) hours as needed for nausea or vomiting.     pyridOXINE (VITAMIN B-6) 100 MG tablet Take 200 mg by mouth daily.      sevelamer carbonate (  RENVELA) 800 MG tablet Take 800 mg by mouth See admin instructions. Take 800 mg with each meal and each snack     sodium chloride (OCEAN) 0.65 % SOLN nasal spray Place 1 spray into both nostrils as needed for congestion.     atorvastatin (LIPITOR) 80 MG tablet Take 1 tablet (80 mg total) by mouth at bedtime. 90 tablet 0   famotidine (PEPCID) 20 MG tablet Take 1 tablet (20 mg total) by mouth at bedtime. 30 tablet 2   vancomycin (VANCOCIN) 250 MG capsule Take 250 mg by mouth every 6 (six) hours.     No facility-administered medications prior to visit.    Review of Systems  Constitutional:  Negative for  chills, fever, malaise/fatigue and weight loss.  HENT:  Negative for hearing loss, sore throat and tinnitus.   Eyes:  Negative for blurred vision and double vision.  Respiratory:  Negative for cough, hemoptysis, sputum production, shortness of breath, wheezing and stridor.   Cardiovascular:  Negative for chest pain, palpitations, orthopnea, leg swelling and PND.  Gastrointestinal:  Negative for abdominal pain, constipation, diarrhea, heartburn, nausea and vomiting.  Genitourinary:  Negative for dysuria, hematuria and urgency.  Musculoskeletal:  Negative for joint pain and myalgias.  Skin:  Negative for itching and rash.  Neurological:  Negative for dizziness, tingling, weakness and headaches.  Endo/Heme/Allergies:  Negative for environmental allergies. Does not bruise/bleed easily.  Psychiatric/Behavioral:  Negative for depression. The patient is not nervous/anxious and does not have insomnia.   All other systems reviewed and are negative.   Objective:  Physical Exam Vitals reviewed.  Constitutional:      General: She is not in acute distress.    Appearance: She is well-developed.  HENT:     Head: Normocephalic and atraumatic.  Eyes:     General: No scleral icterus.    Conjunctiva/sclera: Conjunctivae normal.     Pupils: Pupils are equal, round, and reactive to light.  Neck:     Vascular: No JVD.     Trachea: No tracheal deviation.  Cardiovascular:     Rate and Rhythm: Normal rate and regular rhythm.     Heart sounds: Normal heart sounds. No murmur heard. Pulmonary:     Effort: Pulmonary effort is normal. No tachypnea, accessory muscle usage or respiratory distress.     Breath sounds: No stridor. No wheezing, rhonchi or rales.  Abdominal:     General: There is no distension.     Palpations: Abdomen is soft.     Tenderness: There is no abdominal tenderness.  Musculoskeletal:        General: No tenderness.     Cervical back: Neck supple.  Lymphadenopathy:     Cervical: No  cervical adenopathy.  Skin:    General: Skin is warm and dry.     Capillary Refill: Capillary refill takes less than 2 seconds.     Findings: No rash.  Neurological:     Mental Status: She is alert and oriented to person, place, and time.  Psychiatric:        Behavior: Behavior normal.     Vitals:   02/18/21 1200  BP: 138/70  Pulse: 72  Temp: (!) 97.5 F (36.4 C)  TempSrc: Oral  SpO2: 94%  Weight: 142 lb 3.2 oz (64.5 kg)  Height: 5' (1.524 m)    94% on RA BMI Readings from Last 3 Encounters:  02/18/21 27.77 kg/m  02/03/21 27.73 kg/m  01/07/21 28.55 kg/m   Wt Readings from Last 3  Encounters:  02/18/21 142 lb 3.2 oz (64.5 kg)  02/03/21 142 lb (64.4 kg)  01/07/21 146 lb 3.2 oz (66.3 kg)     CBC    Component Value Date/Time   WBC 8.9 04/26/2020 2330   RBC 3.36 (L) 04/26/2020 2330   HGB 10.5 (L) 02/03/2021 0701   HCT 31.0 (L) 02/03/2021 0701   PLT 140 (L) 04/26/2020 2330   MCV 99.7 04/26/2020 2330   MCH 33.3 04/26/2020 2330   MCHC 33.4 04/26/2020 2330   RDW 14.2 04/26/2020 2330   LYMPHSABS 1.4 03/30/2019 0718   MONOABS 0.6 03/30/2019 0718   EOSABS 0.3 03/30/2019 0718   BASOSABS 0.0 03/30/2019 0718     Chest Imaging: 11/27/2020 CT chest: Reviewed images today in the office with patient.  Multiple small pulmonary nodules 1 of which has slowly enlarged over time concerning for metastatic disease with a history of renal cell cancer. The patient's images have been independently reviewed by me.    Pulmonary Functions Testing Results: No flowsheet data found.  FeNO:   Pathology:  12/30/2020 left lower lobe fine-needle aspiration, brushings: Neuroendocrine tumor, carcinoid  Echocardiogram:   Heart Catheterization:     Assessment & Plan:     ICD-10-CM   1. 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 History of renal cell cancer  Z85.528     4.  History of endometrial cancer  Z85.42     5. Non-smoker  Z78.9        Discussion:  This is a 76 year old female that was diagnosed with renal cell carcinoma in 2010, status post nephrectomy, history of endometrial cancer status post hysterectomy plus chemo and radiation.  Found to have multiple pulmonary nodules that have slowly enlarged over time.  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  However we did not exclude what the other lesion on the other side was based on this biopsy that was adjacent to the pericardium so she was taken back for repeat biopsy.  Unfortunately we are only able to get atypical cells in this case was nondiagnostic for the right sided lesion.  Plan: Due to the patient's other medical comorbidities and the fact that this also clinically looks like a carcinoid tumor I think that we should have follow-up CT imaging to see if this lesion gets any bigger over time before undergoing a repeat biopsy of any kind. The low-grade carcinoid is also likely to not because of trouble over time. I do not think that she is a good candidate for surgical resection of such a small carcinoid. I can reach out to her radiation oncologist to see if they would consider radiation treatments  I think we need to watch the lesion on the right side if these get bigger we could always consider repeat biopsy.  But I would not do this unless there is obvious significant growth. Patient is agreeable to this plan. We will have a repeat noncontrasted CT chest in 6 months. Follow-up with [place] in August after this CAT scan is complete.    Current Outpatient Medications:    acetaminophen (TYLENOL) 500 MG tablet, Take 1,000 mg by mouth every 6 (six) hours as needed for  moderate pain., Disp: , Rfl:     Alpha-Lipoic Acid 600 MG CAPS, Take 600 mg by mouth daily., Disp: , Rfl:    aspirin 81 MG tablet, Take 81 mg by mouth at bedtime., Disp: , Rfl:    benzonatate (TESSALON) 100 MG capsule, Take 100-200 mg by mouth 3 (three) times daily as needed for cough., Disp: , Rfl:    cinacalcet (SENSIPAR) 30 MG tablet, TAKE 1 TABLET BY MOUTH THREE  DAYS (TIMES) A WEEK  AFTER DIALYSIS, Disp: , Rfl:    cyanocobalamin (,VITAMIN B-12,) 1000 MCG/ML injection, Inject 1 mL (1,000 mcg total) into the muscle every 30 (thirty) days for 90 doses., Disp: 3 mL, Rfl: 29   Darbepoetin Alfa (ARANESP) 60 MCG/0.3ML SOSY injection, Inject 0.3 mLs (60 mcg total) into the vein every Tuesday with hemodialysis., Disp: 4.2 mL, Rfl:    estradiol (ESTRACE) 0.1 MG/GM vaginal cream, Place 1 Applicatorful vaginally 3 (three) times a week., Disp: , Rfl:    fluticasone (FLONASE) 50 MCG/ACT nasal spray, Place 1 spray into both nostrils daily as needed for allergies or rhinitis., Disp: , Rfl:    furosemide (LASIX) 20 MG tablet, Take 20 mg by mouth 2 (two) times a week. Tues and Saturday night, Disp: , Rfl:    gabapentin (NEURONTIN) 100 MG capsule, Take 100 mg by mouth 3 (three) times daily. , Disp: , Rfl:    isosorbide mononitrate (IMDUR) 60 MG 24 hr tablet, TAKE 1 TABLET BY MOUTH EVERY DAY, Disp: 90 tablet, Rfl: 3   lidocaine-prilocaine (EMLA) cream, Apply 1 application topically Every Tuesday,Thursday,and Saturday with dialysis., Disp: , Rfl:    loperamide (IMODIUM A-D) 2 MG tablet, Take 4 mg by mouth as needed for diarrhea or loose stools., Disp: , Rfl:    loratadine (CLARITIN) 10 MG tablet, Take 10 mg by mouth daily., Disp: , Rfl:    magnesium oxide (MAG-OX) 400 MG tablet, Take 400 mg by mouth daily., Disp: , Rfl:    metoprolol tartrate (LOPRESSOR) 50 MG tablet, TAKE 1 TABLET BY MOUTH TWICE A DAY, Disp: 180 tablet, Rfl: 2   nitroGLYCERIN (NITROSTAT) 0.4 MG SL tablet, Place 1 tablet (0.4 mg total) under the tongue every 5 (five) minutes as  needed for chest pain., Disp: 25 tablet, Rfl: 11   Omega-3 Fatty Acids (FISH OIL) 1200 MG CAPS, Take 2,400 mg by mouth in the morning and at bedtime., Disp: , Rfl:    omeprazole (PRILOSEC) 40 MG capsule, Take 40 mg by mouth daily before breakfast., Disp: , Rfl:    Probiotic Product (ALIGN) 4 MG CAPS, Take 4 mg by mouth daily., Disp: , Rfl:    promethazine (PHENERGAN) 12.5 MG tablet, Take 12.5 mg by mouth every 6 (six) hours as needed for nausea or vomiting., Disp: , Rfl:    pyridOXINE (VITAMIN B-6) 100 MG tablet, Take 200 mg by mouth daily. , Disp: , Rfl:    sevelamer carbonate (RENVELA) 800 MG tablet, Take 800 mg by mouth See admin instructions. Take 800 mg with each meal and each snack, Disp: , Rfl:    sodium chloride (OCEAN) 0.65 % SOLN nasal spray, Place 1 spray into both nostrils as needed for congestion., Disp: , Rfl:    atorvastatin (LIPITOR) 80 MG tablet, Take 1 tablet (80 mg total) by mouth at bedtime., Disp: 90 tablet, Rfl: 0   famotidine (PEPCID) 20 MG tablet, Take 1 tablet (20 mg total) by mouth at bedtime., Disp: 30 tablet, Rfl: 2    Garner Nash,  DO [facility] 02/18/2021 12:15 PM

## 2021-02-18 NOTE — Patient Instructions (Signed)
Thank you for visiting Dr. Valeta Harms at [facility]. Today we recommend the following:  Orders Placed This Encounter  Procedures   CT Super D Chest Wo Contrast   Please see [place] in August after your CT Chest is complete.   Return in about 6 months (around 08/18/2021) for w/ Dr. Valeta Harms .    Please do your part to reduce the spread of COVID-19.

## 2021-02-19 ENCOUNTER — Other Ambulatory Visit: Payer: Self-pay

## 2021-02-19 DIAGNOSIS — D3A090 Benign carcinoid tumor of the bronchus and lung: Secondary | ICD-10-CM

## 2021-02-26 NOTE — Progress Notes (Signed)
Thoracic Location of Tumor / Histology: Carcinoid tumor of left lung  Biopsies: Dr. Valeta Harms Pathology:  12/30/2020 left lower lobe fine-needle aspiration, brushings: Neuroendocrine tumor, carcinoid  Tobacco/Marijuana/Snuff/ETOH use: No to all.  Past/Anticipated interventions by cardiothoracic surgery, if any:  I do not think that she is a good candidate for surgical resection of such a small carcinoid.  Past/Anticipated interventions by medical oncology, if any: Plan: Due to the patient's other medical comorbidities and the fact that this also clinically looks like a carcinoid tumor I think that we should have follow-up CT imaging to see if this lesion gets any bigger over time before undergoing a repeat biopsy of any kind. The low-grade carcinoid is also likely to not because of trouble over time. I can reach out to her radiation oncologist to see if they would consider radiation treatments  I think we need to watch the lesion on the right side if these get bigger we could always consider repeat biopsy.  But I would not do this unless there is obvious significant growth. Patient is agreeable to this plan. We will have a repeat noncontrasted CT chest in 6 months. Follow-up with [place] in August after this CAT scan is complete.  Signs/Symptoms Weight changes, if any:  No Respiratory complaints, if any: SOB with activity Hemoptysis, if any: No Pain issues, if any:  0/10  SAFETY ISSUES: Prior radiation?  Yes, endometrial ca and had radiation 2015 Pacemaker/ICD?  No Possible current pregnancy? No Is the patient on methotrexate?  No  Current Complaints / other details:  Dialysis on Tuesday- Thursday-Saturday

## 2021-03-04 ENCOUNTER — Ambulatory Visit
Admission: RE | Admit: 2021-03-04 | Discharge: 2021-03-04 | Disposition: A | Discharge status: Home / Self Care | Payer: Medicare PPO | Source: Ambulatory Visit | Attending: Radiation Oncology | Admitting: Radiation Oncology

## 2021-03-04 ENCOUNTER — Encounter: Payer: Self-pay | Admitting: Radiation Oncology

## 2021-03-04 ENCOUNTER — Other Ambulatory Visit: Payer: Self-pay

## 2021-03-04 VITALS — BP 121/91 | HR 66 | Temp 97.7°F | Resp 20 | Ht 60.0 in | Wt 142.0 lb

## 2021-03-04 DIAGNOSIS — E119 Type 2 diabetes mellitus without complications: Secondary | ICD-10-CM | POA: Diagnosis not present

## 2021-03-04 DIAGNOSIS — D3A090 Benign carcinoid tumor of the bronchus and lung: Secondary | ICD-10-CM | POA: Diagnosis present

## 2021-03-04 DIAGNOSIS — M5030 Other cervical disc degeneration, unspecified cervical region: Secondary | ICD-10-CM | POA: Diagnosis not present

## 2021-03-04 DIAGNOSIS — Z8673 Personal history of transient ischemic attack (TIA), and cerebral infarction without residual deficits: Secondary | ICD-10-CM | POA: Diagnosis not present

## 2021-03-04 DIAGNOSIS — I700 Atherosclerosis of aorta: Secondary | ICD-10-CM | POA: Insufficient documentation

## 2021-03-04 DIAGNOSIS — N186 End stage renal disease: Secondary | ICD-10-CM | POA: Insufficient documentation

## 2021-03-04 DIAGNOSIS — Z8542 Personal history of malignant neoplasm of other parts of uterus: Secondary | ICD-10-CM | POA: Insufficient documentation

## 2021-03-04 DIAGNOSIS — I2510 Atherosclerotic heart disease of native coronary artery without angina pectoris: Secondary | ICD-10-CM | POA: Diagnosis not present

## 2021-03-04 DIAGNOSIS — K219 Gastro-esophageal reflux disease without esophagitis: Secondary | ICD-10-CM | POA: Diagnosis not present

## 2021-03-04 DIAGNOSIS — Z79899 Other long term (current) drug therapy: Secondary | ICD-10-CM | POA: Insufficient documentation

## 2021-03-04 DIAGNOSIS — D649 Anemia, unspecified: Secondary | ICD-10-CM | POA: Diagnosis not present

## 2021-03-04 DIAGNOSIS — J45909 Unspecified asthma, uncomplicated: Secondary | ICD-10-CM | POA: Insufficient documentation

## 2021-03-04 DIAGNOSIS — M129 Arthropathy, unspecified: Secondary | ICD-10-CM | POA: Diagnosis not present

## 2021-03-04 DIAGNOSIS — R011 Cardiac murmur, unspecified: Secondary | ICD-10-CM | POA: Insufficient documentation

## 2021-03-04 DIAGNOSIS — G629 Polyneuropathy, unspecified: Secondary | ICD-10-CM | POA: Diagnosis not present

## 2021-03-04 DIAGNOSIS — Z806 Family history of leukemia: Secondary | ICD-10-CM | POA: Diagnosis not present

## 2021-03-04 DIAGNOSIS — E785 Hyperlipidemia, unspecified: Secondary | ICD-10-CM | POA: Diagnosis not present

## 2021-03-04 DIAGNOSIS — I10 Essential (primary) hypertension: Secondary | ICD-10-CM | POA: Insufficient documentation

## 2021-03-04 NOTE — Progress Notes (Signed)
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    ICD-10-CM   1. Carcinoid tumor of lung, unspecified whether malignant  D3A.090     2. Carcinoid tumor of left lung  D3A.090     3. Carcinoid tumor of right lung  D3A.090      HISTORY OF PRESENT ILLNESS: Dana Jackson is a 76 y.o. female seen at the request of Dr. Valeta Harms.  She has a history of renal cell carcinoma status post nephrectomy in 2010 and also endometrial carcinoma status post hysterectomy, chemoradiation (HDR) in October 2015 with her oncology team at [facility].  Her GynOnc is Dr. Clarene Essex and her radiation oncologist at [facility] is Dr. Ladona Horns.  She has been on surveillance for several years now and had a recent CT chest on 11/26/2020 which showed multiple pulmonary nodules with a new 5.4 mm nodule in the left upper lobe, 3 mm nodule in the left lower lobe, enlarging, pre-existing nodule in the left lower lobe, and a 6 mm right middle lobe nodule as well as a small, solitary right paratracheal node that has enlarged.      She was referred for consult with Dr. Valeta Harms on 12/12/2020 and the recommendation was to proceed with tissue biopsy.  She underwent a robotic assisted bronchoscopy on 12/30/2020 with tissue sampling of the left lower lobe nodule.  Final surgical pathology confirmed a neuroendocrine consistent with carcinoid or atypical carcinoid.  Given the diagnosis was carcinoid and not metastatic renal cell carcinoma, the decision was to repeat a bronchoscopy to sample the right middle lobe lesion and this was performed on 02/03/2021.  Pathology from this  procedure revealed atypical cells but overall, nondiagnostic.  However, based on the appearance on imaging, the lesion in the right middle lobe is felt most likely to represent another carcinoid tumor.  She is not felt to be a surgical candidate secondary to her multiple medical comorbidities including end-stage renal disease on dialysis.  Therefore, she has kindly been referred for consultation today to discuss radiation treatment options in the management of the newly diagnosed carcinoid disease.  Of note, she does have end-stage renal disease, currently on dialysis Tuesday/Thursday/Saturday.  Her nephrologist is at [facility]  PREVIOUS RADIATION THERAPY: Yes  2016: HRD concurrent with chemo at [facility] for treatment of endometrial cancer (Dr. Ladona Horns)  PAST MEDICAL HISTORY:  Past Medical History:  Diagnosis Date   Anemia    Arthritis    hands   Asthma    childhood   Bacteremia due to Escherichia coli 06/2013   Currently being treated with anti-biotics   CAD in native artery 12/2010   a) Cath for exertional angina & EKG changes: 40% LM, 80% mid LAD.  95% ost cX, 80-90% PDA --> CABG X3; b) Post CABG CATH for + Myoview with basal anterior ischemia -> Ost LAD 80% (diffuse) then 100% after SP2, RI 70% (too small for PCI), Ost-prox Cx 99% & OM1 90%, OstrPDA 80% (small); Occluded SVG-rPDA.  Patent LIMA-dLAD, SVG-OM: culprit ~ p-m LAD pre-LIMA & RI - not good PCI target --> Med Rx   CKD (chronic kidney  disease) stage 3, GFR 30-59 ml/min (HCC)    Degenerative disc disease, cervical    Degenerative disc disease, cervical [722.4]   Diabetes mellitus without complication (HCC)    Type II   Dyspnea    Endometrial cancer ([place]) 11/2013   Treated with TAH with pelvic lymphadenectomy followed by radiation and chemotherapy   GERD (gastroesophageal reflux disease)    Hearing loss    bilateral - no hearing aids   Heart murmur    History of asthma     childhood   History of blood  transfusion    History of pneumonia    "2-3 times"   History of unstable angina November/December 2012   T wave inversions in inferolateral leads.  No stress test performed.   Hyperlipidemia LDL goal <70    Hypertension, essential, benign    Neuropathy    hands   Pneumonia 2018   Renal cell carcinoma ([place]) 11/18/2008   T2aNx s/p partial left nephrectomy   S/P CABG x 3 12/2010   LIMA-LAD, SVG RPL, SVG-Circumflex   Stroke ([place])    TIA- no residual    TIA (transient ischemic attack) 1992 &  2010      PAST SURGICAL HISTORY: Past Surgical History:  Procedure Laterality Date   ANTERIOR CERVICAL DECOMP/DISCECTOMY FUSION  10/11/2005   multi-level   AV FISTULA PLACEMENT Left 08/03/2017   Procedure: ARTERIOVENOUS (AV) FISTULA CREATION LEFT ARM;  Surgeon: Rosetta Posner, MD;  Location: [place];  Service: Vascular;  Laterality: Left;   BRONCHIAL BIOPSY  12/30/2020   Procedure: BRONCHIAL BIOPSIES;  Surgeon: Garner Nash, DO;  Location: [place] [facility];  Service: Pulmonary;;   BRONCHIAL BIOPSY  02/03/2021   Procedure: BRONCHIAL BIOPSIES;  Surgeon: Garner Nash, DO;  Location: [place] [facility];  Service: Pulmonary;;   BRONCHIAL BRUSHINGS  12/30/2020   Procedure: BRONCHIAL BRUSHINGS;  Surgeon: Garner Nash, DO;  Location: [place] [facility];  Service: Pulmonary;;   BRONCHIAL BRUSHINGS  02/03/2021   Procedure: BRONCHIAL BRUSHINGS;  Surgeon: Garner Nash, DO;  Location: [place] [facility];  Service: Pulmonary;;   BRONCHIAL NEEDLE ASPIRATION BIOPSY  12/30/2020   Procedure: BRONCHIAL NEEDLE ASPIRATION BIOPSIES;  Surgeon: Garner Nash, DO;  Location: [place];  Service: Pulmonary;;   BRONCHIAL NEEDLE ASPIRATION BIOPSY  02/03/2021   Procedure: BRONCHIAL NEEDLE ASPIRATION BIOPSIES;  Surgeon: Garner Nash, DO;  Location: [place] [facility];  Service: Pulmonary;;   CARDIAC CATHETERIZATION  12/24/10   40% left main, 80% mid LAD, 95% ostial circumflex, 80-90% PDA.   CARDIAC CATHETERIZATION N/A 07/01/2014    Procedure: Left Heart Cath and Cors/Grafts Angiography;  Surgeon: Leonie Man, MD;  Location: [facility] IN[facility] CV LAB:  For Abn Nuc @ [facility]: Ost LAD 80%, mid LAD 100% after S/P 2, 70% RI (no PCI target), ost-prox Cx 99%, OM1 90%. Ost rPDA 80% (~ pre-CABG), occluded SVG-rPDA, patent LIMA-dLAD, SVG OM; potential culprit for abn Nuc scan = pLAD Dz, small RI 70% or PDA -> med Rx   CAROTID ENDARTERECTOMY Left    CHOLECYSTECTOMY  ~ [place] GRAFT  12/25/2010   Procedure: CORONARY ARTERY BYPASS GRAFTING (CABGX3 - LIMA-LAD, SVG RPL, SVG-Circumflex);  Surgeon: Rexene Alberts, MD;  Location: [place];  Service: Open Heart Surgery;  Laterality: N/A;   DILATATION & CURETTAGE/HYSTEROSCOPY WITH MYOSURE N/A 11/02/2013   Procedure: DILATATION & CURETTAGE/HYSTEROSCOPY WITH MYOSURE ABLATION;  Surgeon: Allena Katz, MD;  Location: [place] [facility];  Service: Gynecology;  Laterality: N/A;   DOPPLER ECHOCARDIOGRAPHY  12/08/2010   EF =>55%,MILD CONCENTRIC LEFT VENTRICULAR HYPERTROPHY   EYE SURGERY Bilateral    Cataract   IR DIALY SHUNT INTRO NEEDLE/INTRACATH INITIAL W/IMG LEFT Left 04/24/2019   IR FLUORO GUIDE CV LINE LEFT  04/26/2019   IR US GUIDE VASC ACCESS LEFT  04/24/2019   IR US GUIDE VASC ACCESS LEFT  04/26/2019   LEFT HEART CATHETERIZATION WITH CORONARY ANGIOGRAM N/A 12/24/2010   Procedure: LEFT HEART CATHETERIZATION WITH CORONARY ANGIOGRAM;  Surgeon: Leonie Man, MD;  Location: [facility] CATH LAB;  Service: Cardiovascular;  Laterality: N/A;   NM MYOCAR SINGLE W/SPECT  07/26/2007   EF 79%, LEFT VENT.FUNCTION NORMAL   PARTIAL NEPHRECTOMY Left 11/18/2008   left partial nephrectomy for renal cell CA   PET Myocardial Perfusion Scan  06/13/2014   At [place]: Moderate size, mild severity completely reversible defect involving the basal anterior, mid anterior and apical anterior segments consistent with ischemia. EF 65% with normal global function.   TONSILLECTOMY     "in college"    TOTAL ABDOMINAL HYSTERECTOMY  November 2015    At [facility]: Robotic procedure with pelvic lymphadenectomy   TRANSTHORACIC ECHOCARDIOGRAM  06/13/2014   At [place]: EF 60-65%. Grade 1 diastolic dysfunction. Mild MR. Aortic sclerosis. Moderate pulmonary hypertension   TUBAL LIGATION  ~ 1984   UPPER GI ENDOSCOPY     VIDEO BRONCHOSCOPY WITH RADIAL ENDOBRONCHIAL ULTRASOUND  12/30/2020   Procedure: RADIAL ENDOBRONCHIAL ULTRASOUND;  Surgeon: Garner Nash, DO;  Location: [place];  Service: Pulmonary;;   VIDEO BRONCHOSCOPY WITH RADIAL ENDOBRONCHIAL ULTRASOUND  02/03/2021   Procedure: RADIAL ENDOBRONCHIAL ULTRASOUND;  Surgeon: Garner Nash, DO;  Location: [facility];  Service: Pulmonary;;   WISDOM TOOTH EXTRACTION      FAMILY HISTORY:  Family History  Problem Relation Age of Onset   AAA (abdominal aortic aneurysm) Mother    Multiple myeloma Mother    GER disease Mother    Coronary artery disease Father    Heart disease Father    Coronary artery disease Sister    Coronary artery disease Brother    Heart disease Brother    Stroke Maternal Grandmother    Heart attack Neg Hx     SOCIAL HISTORY:  Social History   Socioeconomic History   Marital status: Married    Spouse name: [name]   Number of children: 2   Years of education: many   Highest education level: Not on file  Occupational History   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  -- Currently being very busy  taking care of 2 foster children that are staying with her daughter.  A 77-year-old and a 61-year-old that they're trying to adopt.  She is very excited about the possibility of becoming a Grandmother.   She does walk and getting exercise, but she is wanting to get back  into more activities just because she has really been limited due to her arthritic pains. She used to do things like walking and biking, and she may try to do some biking again, or at least stationary biking.    Does not smoke, does not drink.   Social Determinants of Health   Financial Resource Strain: Not on file  Food Insecurity: Not on file  Transportation Needs: Not on file  Physical Activity: Not on file  Stress: Not on file  Social Connections: Not on file  Intimate Partner Violence: Not on file    ALLERGIES: Sulfa antibiotics, Erythromycin, Pravastatin, Simvastatin, Codeine, and Gemfibrozil  MEDICATIONS:  Current Outpatient Medications  Medication Sig Dispense Refill   acetaminophen (TYLENOL) 500 MG tablet Take 1,000 mg by mouth every 6 (six) hours as needed for moderate pain.     Alpha-Lipoic Acid 600 MG CAPS Take 600 mg by mouth daily.     aspirin 81 MG tablet Take 81 mg by mouth at bedtime.     benzonatate (TESSALON) 100 MG capsule Take 100-200 mg by mouth 3 (three) times daily as needed for cough.     calcitRIOL (ROCALTROL) 0.25 MCG capsule Take by mouth.     cinacalcet (SENSIPAR) 30 MG tablet TAKE 1 TABLET BY MOUTH THREE  DAYS (TIMES) A WEEK  AFTER DIALYSIS     cyanocobalamin (,VITAMIN B-12,) 1000 MCG/ML injection Inject 1 mL (1,000 mcg total) into the muscle every 30 (thirty) days for 90 doses. 3 mL 29   Darbepoetin Alfa (ARANESP) 60 MCG/0.3ML SOSY injection Inject 0.3 mLs (60 mcg total) into the vein every Tuesday with hemodialysis. 4.2 mL    estradiol (ESTRACE) 0.1 MG/GM vaginal cream Place 1 Applicatorful vaginally 3 (three) times a week.     fluticasone (FLONASE) 50 MCG/ACT nasal spray Place 1 spray into  both nostrils daily as needed for allergies or rhinitis.     furosemide (LASIX) 20 MG tablet Take 20 mg by mouth 2 (two) times a week. Tues and Saturday night     gabapentin (NEURONTIN) 100 MG capsule Take 100 mg by mouth 3 (three) times daily.      iron sucrose in sodium chloride 0.9 % 100 mL Iron Sucrose (Venofer)     isosorbide mononitrate (IMDUR) 60 MG 24 hr tablet TAKE 1 TABLET BY MOUTH EVERY DAY 90 tablet 3   lidocaine-prilocaine (EMLA) cream Apply 1 application topically Every Tuesday,Thursday,and Saturday with dialysis.     loperamide (IMODIUM A-D) 2 MG tablet Take 4 mg by mouth as needed for diarrhea or loose stools.     loratadine (CLARITIN) 10 MG tablet Take 10 mg by mouth daily.     magnesium oxide (MAG-OX) 400 MG tablet Take 400 mg by mouth daily.     Methoxy PEG-Epoetin Beta (MIRCERA IJ) Mircera     metoprolol tartrate (LOPRESSOR) 50 MG tablet TAKE 1 TABLET BY MOUTH TWICE A DAY 180 tablet 2   nitroGLYCERIN (NITROSTAT) 0.4 MG SL tablet Place 1 tablet (0.4 mg total) under the tongue every 5 (five) minutes as needed for chest pain. 25 tablet 11   nitroGLYCERIN (NITROSTAT) 0.4 MG SL tablet Place 1 tablet under the tongue every 5 (five) minutes as needed.     Omega-3 Fatty Acids (FISH OIL) 1200 MG CAPS Take 2,400 mg by mouth in the morning and at bedtime.     omeprazole (PRILOSEC) 40 MG capsule Take 40 mg by mouth daily before breakfast.     Probiotic Product (ALIGN) 4 MG CAPS  Take 4 mg by mouth daily.     promethazine (PHENERGAN) 12.5 MG tablet Take 12.5 mg by mouth every 6 (six) hours as needed for nausea or vomiting.     pyridOXINE (VITAMIN B-6) 100 MG tablet Take 200 mg by mouth daily.      sevelamer carbonate (RENVELA) 800 MG tablet Take 800 mg by mouth See admin instructions. Take 800 mg with each meal and each snack     sodium chloride (OCEAN) 0.65 % SOLN nasal spray Place 1 spray into both nostrils as needed for congestion.     atorvastatin (LIPITOR) 80 MG tablet Take 1 tablet  (80 mg total) by mouth at bedtime. 90 tablet 0   famotidine (PEPCID) 20 MG tablet Take 1 tablet (20 mg total) by mouth at bedtime. 30 tablet 2   loratadine (CLARITIN) 10 MG tablet Take 1 tablet by mouth daily.     No current facility-administered medications for this encounter.    REVIEW OF SYSTEMS:  On review of systems, the patient reports that she is doing well overall.  She denies any chest pain, shortness of breath, cough, fevers, chills, night sweats, unintended weight changes.  She denies any bowel or bladder disturbances, and denies abdominal pain, nausea or vomiting.  She denies any new musculoskeletal or joint aches or pains. A complete review of systems is obtained and is otherwise negative.    PHYSICAL EXAM:  Wt Readings from Last 3 Encounters:  03/04/21 142 lb (64.4 kg)  02/18/21 142 lb 3.2 oz (64.5 kg)  02/03/21 142 lb (64.4 kg)   Temp Readings from Last 3 Encounters:  03/04/21 97.7 F (36.5 C)  02/18/21 (!) 97.5 F (36.4 C) (Oral)  02/03/21 98 F (36.7 C)   BP Readings from Last 3 Encounters:  03/04/21 (!) 121/91  02/18/21 138/70  02/03/21 (!) 123/58   Pulse Readings from Last 3 Encounters:  03/04/21 66  02/18/21 72  02/03/21 66   Pain Assessment Pain Score: 0-No pain/10  In general this is a well appearing Caucasian female in no acute distress.  She's alert and oriented x4 and appropriate throughout the examination. Cardiopulmonary assessment is negative for acute distress and she exhibits normal effort.   KPS = 100  100 - Normal; no complaints; no evidence of disease. 90   - Able to carry on normal activity; minor signs or symptoms of disease. 80   - Normal activity with effort; some signs or symptoms of disease. 68   - Cares for self; unable to carry on normal activity or to do active work. 60   - Requires occasional assistance, but is able to care for most of his personal needs. 50   - Requires considerable assistance and frequent medical care. 35   -  Disabled; requires special care and assistance. 35   - Severely disabled; hospital admission is indicated although death not imminent. 81   - Very sick; hospital admission necessary; active supportive treatment necessary. 10   - Moribund; fatal processes progressing rapidly. 0     - Dead  Karnofsky DA, Abelmann Finney, Craver LS and Burchenal [facility] [phone]) The use of the nitrogen mustards in the palliative treatment of carcinoma: with particular reference to bronchogenic carcinoma Cancer 1 634-56  LABORATORY DATA:  Lab Results  Component Value Date   WBC 8.9 04/26/2020   HGB 10.5 (L) 02/03/2021   HCT 31.0 (L) 02/03/2021   MCV 99.7 04/26/2020   PLT 140 (L) 04/26/2020   Lab Results  Component  Value Date   NA 136 02/03/2021   K 4.3 02/03/2021   CL 98 02/03/2021   CO2 24 04/26/2020   Lab Results  Component Value Date   ALT 23 04/26/2020   AST 21 04/26/2020   ALKPHOS 134 (H) 04/26/2020   BILITOT 0.9 04/26/2020     RADIOGRAPHY: DG CHEST PORT 1 VIEW  Result Date: 02/03/2021 CLINICAL DATA:  76 year old female status post bronchoscopy. EXAM: PORTABLE CHEST 1 VIEW COMPARISON:  Chest x-ray 12/30/2020. FINDINGS: Lung volumes are normal. No consolidative airspace disease. No pleural effusions. No pneumothorax. No pulmonary nodule or mass noted. Pulmonary vasculature and the cardiomediastinal silhouette are within normal limits. Atherosclerotic calcifications in the thoracic aorta. Status post median sternotomy for CABG. Orthopedic fixation hardware in the lower cervical spine incidentally noted. Surgical clips project over the left mid to lower lung and the lower mediastinum, as well as the right upper quadrant of the abdomen. IMPRESSION: 1. No radiographic evidence of acute cardiopulmonary disease. 2. Aortic atherosclerosis. 3. Postoperative changes, as above. Electronically Signed   By: Vinnie Langton M.D.   On: 02/03/2021 09:10   DG C-ARM BRONCHOSCOPY  Result Date: 02/03/2021 C-ARM  BRONCHOSCOPY: Fluoroscopy was utilized by the requesting physician.  No radiographic interpretation.      IMPRESSION/PLAN: 45. 76 y.o. 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 Today, we talked to the patient and her husband about the findings and workup thus far. We discussed the natural history of carcinoid and general treatment, highlighting the role of radiotherapy in the management when lesions are inoperable or patients are not felt to be surgical candidates. In her case, she is not felt to be a surgical candidate due to her multiple medical co-morbidities. Therefore, we discussed the available radiation techniques, and focused on the details and logistics of delivery. The recommendation is for a 3 fraction course of SBRT to the LLL and RML lesions. We reviewed the anticipated acute and late sequelae associated with radiation in this setting. The patient was encouraged to ask questions that were answered to her satisfaction and she is in agreement to proceed. We will share our discussion with Dr. Valeta Harms and proceed with scheduling CT SIM in the next week, in anticipation of beginning her treatments in the near future. We enjoyed meeting her and her husband today and look forward to continuing to participate in her care.  We personally spent 70 minutes in this encounter including chart review, reviewing radiological studies, meeting face-to-face with the patient, entering orders and completing documentation.    Nicholos Johns, PA-C    Tyler Pita, MD  [facility] Direct Dial: [phone]   Fax: [phone] [email or web address]   Skype   LinkedIn

## 2021-03-13 ENCOUNTER — Ambulatory Visit
Admission: RE | Admit: 2021-03-13 | Discharge: 2021-03-13 | Disposition: A | Discharge status: Home / Self Care | Payer: Medicare PPO | Source: Ambulatory Visit | Attending: Radiation Oncology | Admitting: Radiation Oncology

## 2021-03-13 ENCOUNTER — Other Ambulatory Visit: Payer: Self-pay

## 2021-03-13 DIAGNOSIS — D3A090 Benign carcinoid tumor of the bronchus and lung: Secondary | ICD-10-CM

## 2021-03-13 DIAGNOSIS — Z510 Encounter for antineoplastic radiation therapy: Secondary | ICD-10-CM | POA: Insufficient documentation

## 2021-03-13 DIAGNOSIS — C7A090 Malignant carcinoid tumor of the bronchus and lung: Secondary | ICD-10-CM | POA: Diagnosis present

## 2021-03-14 NOTE — Progress Notes (Signed)
?  Radiation Oncology         [phone] ?________________________________ ? ?Name: Dana Jackson MRN: [id number]  ?Date: 03/13/2021  DOB: 06-15-1945 ? ?STEREOTACTIC BODY RADIOTHERAPY ?SIMULATION AND TREATMENT PLANNING NOTE ? ?  ICD-10-CM   ?1. Carcinoid tumor of right lung  D3A.090   ?  ? ? 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 ?NARRATIVE:  The patient was brought to the [facility].  Identity was confirmed.  All relevant records and images related to the planned course of therapy were reviewed.  The patient freely provided informed written consent to proceed with treatment after reviewing the details related to the planned course of therapy. The consent form was witnessed and verified by the simulation staff.  Then, the patient was set-up in a stable reproducible  supine position for radiation therapy.  A BodyFix immobilization pillow was fabricated for reproducible positioning.  Then I personally applied the abdominal compression paddle to limit respiratory excursion.  4D respiratoy motion management CT images were obtained.  Surface markings were placed.  The CT images were loaded into the planning software.  Then, using Cine, MIP, and standard views, the internal target volume (ITV) and planning target volumes (PTV) were delinieated, and avoidance structures were contoured.  Treatment planning then occurred.  The radiation prescription was entered and confirmed.  A total of two complex treatment devices were fabricated in the form of the BodyFix immobilization pillow and a neck accuform cushion.  I have requested : 3D Simulation  I have requested a DVH of the following structures: Heart, Lungs, Esophagus, Chest Wall, Brachial Plexus, Major Blood Vessels, and targets. ? ?SPECIAL TREATMENT PROCEDURE:  The planned course of therapy using radiation constitutes a special treatment procedure. Special care is  required in the management of this patient for the following reasons. This treatment constitutes a Special Treatment Procedure for the following reason: [ High dose per fraction requiring special monitoring for increased toxicities of treatment including daily imaging..  The special nature of the planned course of radiotherapy will require increased physician supervision and oversight to ensure patient's safety with optimal treatment outcomes.  This requires extended time and effort.   ? ?RESPIRATORY MOTION MANAGEMENT SIMULATION:  In order to account for effect of respiratory motion on target structures and other organs in the planning and delivery of radiotherapy, this patient underwent respiratory motion management simulation.  To accomplish this, when the patient was brought to the CT simulation planning suite, 4D respiratoy motion management CT images were obtained.  The CT images were loaded into the planning software.  Then, using a variety of tools including Cine, MIP, and standard views, the target volume and planning target volumes (PTV) were delineated.  Avoidance structures were contoured.  Treatment planning then occurred.  Dose volume histograms were generated and reviewed for each of the requested structure.  The resulting plan was carefully reviewed and approved today. ? ?PLAN:  The patient will receive 54 Gy in 3 fractions to the Left Lower lung lesion and 50 Gy in 5 fractions to the RML lesion. ? ?________________________________ ? ?Sheral Apley Tammi Klippel, M.D. ? ?

## 2021-03-23 DIAGNOSIS — Z510 Encounter for antineoplastic radiation therapy: Secondary | ICD-10-CM | POA: Diagnosis not present

## 2021-03-27 ENCOUNTER — Other Ambulatory Visit: Payer: Self-pay

## 2021-03-27 ENCOUNTER — Ambulatory Visit
Admission: RE | Admit: 2021-03-27 | Discharge: 2021-03-27 | Disposition: A | Discharge status: Home / Self Care | Payer: Medicare PPO | Source: Ambulatory Visit | Attending: Radiation Oncology | Admitting: Radiation Oncology

## 2021-03-27 DIAGNOSIS — Z510 Encounter for antineoplastic radiation therapy: Secondary | ICD-10-CM | POA: Diagnosis not present

## 2021-03-27 DIAGNOSIS — C7801 Secondary malignant neoplasm of right lung: Secondary | ICD-10-CM | POA: Insufficient documentation

## 2021-03-30 ENCOUNTER — Ambulatory Visit: Payer: Medicare PPO

## 2021-03-31 ENCOUNTER — Ambulatory Visit: Payer: Medicare PPO

## 2021-04-01 ENCOUNTER — Other Ambulatory Visit: Payer: Self-pay

## 2021-04-01 ENCOUNTER — Ambulatory Visit
Admission: RE | Admit: 2021-04-01 | Discharge: 2021-04-01 | Disposition: A | Discharge status: Home / Self Care | Payer: Medicare PPO | Source: Ambulatory Visit | Attending: Radiation Oncology | Admitting: Radiation Oncology

## 2021-04-01 DIAGNOSIS — C7801 Secondary malignant neoplasm of right lung: Secondary | ICD-10-CM

## 2021-04-01 DIAGNOSIS — Z510 Encounter for antineoplastic radiation therapy: Secondary | ICD-10-CM | POA: Diagnosis not present

## 2021-04-03 ENCOUNTER — Other Ambulatory Visit: Payer: Self-pay

## 2021-04-03 ENCOUNTER — Ambulatory Visit
Admission: RE | Admit: 2021-04-03 | Discharge: 2021-04-03 | Disposition: A | Discharge status: Home / Self Care | Payer: Medicare PPO | Source: Ambulatory Visit | Attending: Radiation Oncology | Admitting: Radiation Oncology

## 2021-04-03 DIAGNOSIS — C7801 Secondary malignant neoplasm of right lung: Secondary | ICD-10-CM

## 2021-04-03 DIAGNOSIS — Z510 Encounter for antineoplastic radiation therapy: Secondary | ICD-10-CM | POA: Diagnosis not present

## 2021-04-06 ENCOUNTER — Ambulatory Visit: Payer: Medicare PPO

## 2021-04-07 ENCOUNTER — Ambulatory Visit
Admission: RE | Admit: 2021-04-07 | Discharge: 2021-04-07 | Disposition: A | Discharge status: Home / Self Care | Payer: Medicare PPO | Source: Ambulatory Visit | Attending: Radiation Oncology | Admitting: Radiation Oncology

## 2021-04-07 ENCOUNTER — Ambulatory Visit: Payer: Medicare PPO

## 2021-04-07 ENCOUNTER — Other Ambulatory Visit: Payer: Self-pay

## 2021-04-07 DIAGNOSIS — Z510 Encounter for antineoplastic radiation therapy: Secondary | ICD-10-CM | POA: Diagnosis not present

## 2021-04-07 DIAGNOSIS — C7801 Secondary malignant neoplasm of right lung: Secondary | ICD-10-CM

## 2021-04-08 ENCOUNTER — Ambulatory Visit: Payer: Medicare PPO

## 2021-04-09 ENCOUNTER — Ambulatory Visit
Admission: RE | Admit: 2021-04-09 | Discharge: 2021-04-09 | Disposition: A | Discharge status: Home / Self Care | Payer: Medicare PPO | Source: Ambulatory Visit | Attending: Radiation Oncology | Admitting: Radiation Oncology

## 2021-04-09 ENCOUNTER — Other Ambulatory Visit: Payer: Self-pay

## 2021-04-09 ENCOUNTER — Encounter: Payer: Self-pay | Admitting: Urology

## 2021-04-09 DIAGNOSIS — D3A090 Benign carcinoid tumor of the bronchus and lung: Secondary | ICD-10-CM

## 2021-04-09 DIAGNOSIS — C7801 Secondary malignant neoplasm of right lung: Secondary | ICD-10-CM

## 2021-04-09 DIAGNOSIS — Z510 Encounter for antineoplastic radiation therapy: Secondary | ICD-10-CM | POA: Diagnosis not present

## 2021-05-19 ENCOUNTER — Encounter: Payer: Self-pay | Admitting: Urology

## 2021-05-19 NOTE — Progress Notes (Signed)
Telephone appointment. I spoke w/ patient's spouse Mr. Dana Jackson, verified identity and began nursing interview. He reports that patient [name] is doing well. No issues conveyed at this time. ? ?Meaningful use complete. ? ?I reminded him of patient's 10:30am-05/20/21 telephone appointment w/ Ashlyn Bruning PA-C. I left my extension [phone] in case patient needs anything. Mr. Hover verbalized understanding of the conversation. ? ?Patient preferred contact [phone] ?

## 2021-05-20 ENCOUNTER — Telehealth: Payer: Self-pay | Admitting: Urology

## 2021-05-20 ENCOUNTER — Ambulatory Visit
Admission: RE | Admit: 2021-05-20 | Discharge: 2021-05-20 | Disposition: A | Discharge status: Home / Self Care | Payer: Medicare PPO | Source: Ambulatory Visit | Attending: Urology | Admitting: Urology

## 2021-05-20 DIAGNOSIS — D3A090 Benign carcinoid tumor of the bronchus and lung: Secondary | ICD-10-CM

## 2021-05-20 NOTE — Telephone Encounter (Signed)
I attempted to reach the patient for her scheduled 1 month follow-up visit but was directed to the voicemail where I was able to leave a message requesting that she return my call at her earliest convenience. ? ?Nicholos Johns, MMS, PA-C ?[facility] ?Radiation Oncology Physician Assistant ?Direct Dial: [phone]  Fax: [phone] ? ?

## 2021-05-20 NOTE — Progress Notes (Signed)
?  Radiation Oncology         [phone] ?________________________________ ? ?Name: Dana Jackson MRN: [id number]  ?Date: 04/09/2021  DOB: 12-17-1945 ? ?End of Treatment Note ? 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 ?Indication for treatment:  Curative, Definitive SBRT      ? ?Radiation treatment dates:   03/27/21 - 04/09/21 ? ?Site/dose:   The target in the LLL lung was treated to 54 Gy in 3 fractions of 18 Gy each and the target in the RML lung was treated to 50 Gy in 5 fractions of 10 Gy each. ? ?Beams/energy:   The patient was treated using stereotactic body radiotherapy according to a 3D conformal radiotherapy plan.  Volumetric arc fields were employed to deliver 6 MV X-rays.  Image guidance was performed with per fraction cone beam CT prior to treatment under personal MD supervision.  Immobilization was achieved using BodyFix Pillow. ? ?Narrative: The patient tolerated radiation treatment relatively well without any ill side effects. ? ?Plan: The patient has completed radiation treatment. The patient will return to radiation oncology clinic for routine followup in one month. I advised them to call or return sooner if they have any questions or concerns related to their recovery or treatment. ?________________________________ ? ?Sheral Apley Tammi Klippel, M.D. ? ?  ?

## 2021-05-20 NOTE — Progress Notes (Addendum)
?Radiation Oncology         (336) [phone] ?________________________________ ? ?Name: Dana Jackson MRN: [id number]  ?Date: 05/20/2021  DOB: 02/05/1945 ? ?Post Treatment Note ? ?CC: Heywood Bene, PA-C  Garner Nash, DO ? 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 ?Interval Since Last Radiation:  5.5 weeks  ?03/27/21 - 04/09/21:   The target in the LLL lung was treated to 54 Gy in 3 fractions of 18 Gy each and the target in the RML lung was treated to 50 Gy in 5 fractions of 10 Gy each. ? ?Narrative:  I spoke with the patient to conduct her routine scheduled 1 month follow up visit via telephone to spare the patient unnecessary potential exposure in the healthcare setting during the current COVID-19 pandemic.  The patient was notified in advance and gave permission to proceed with this visit format. ? ?She tolerated radiation treatment relatively well without any ill side effects.                             ? ?On review of systems, the patient states that she is doing very well in general and currently without complaints.  She continues with a chronic cough but reports that this is unchanged and she specifically denies increased shortness of breath, chest pain, fever, chills or night sweats.  She feels like her energy level is gradually returning and overall, she is pleased with her progress to date. ? ?ALLERGIES:  is allergic to sulfa antibiotics, erythromycin, pravastatin, simvastatin, codeine, and gemfibrozil. ? ?Meds: ?Current Outpatient Medications  ?Medication Sig Dispense Refill  ? acetaminophen (TYLENOL) 500 MG tablet Take 1,000 mg by mouth every 6 (six) hours as needed for moderate pain.    ? Alpha-Lipoic Acid 600 MG CAPS Take 600 mg by mouth daily.    ? aspirin 81 MG tablet Take 81 mg by mouth at bedtime.    ? atorvastatin (LIPITOR) 80 MG tablet Take 1 tablet (80 mg total) by mouth at bedtime. 90 tablet 0  ?  benzonatate (TESSALON) 100 MG capsule Take 100-200 mg by mouth 3 (three) times daily as needed for cough.    ? calcitRIOL (ROCALTROL) 0.25 MCG capsule Take by mouth.    ? cinacalcet (SENSIPAR) 30 MG tablet TAKE 1 TABLET BY MOUTH THREE  DAYS (TIMES) A WEEK  AFTER DIALYSIS    ? cyanocobalamin (,VITAMIN B-12,) 1000 MCG/ML injection Inject 1 mL (1,000 mcg total) into the muscle every 30 (thirty) days for 90 doses. 3 mL 29  ? Darbepoetin Alfa (ARANESP) 60 MCG/0.3ML SOSY injection Inject 0.3 mLs (60 mcg total) into the vein every Tuesday with hemodialysis. 4.2 mL   ? estradiol (ESTRACE) 0.1 MG/GM vaginal cream Place 1 Applicatorful vaginally 3 (three) times a week.    ? famotidine (PEPCID) 20 MG tablet Take 1 tablet (20 mg total) by mouth at bedtime. 30 tablet 2  ? fluticasone (FLONASE) 50 MCG/ACT nasal spray Place 1 spray into both nostrils daily as needed for allergies or rhinitis.    ? furosemide (LASIX) 20 MG tablet Take 20 mg by mouth 2 (two) times a week. Tues and Saturday night    ? gabapentin (NEURONTIN) 100 MG capsule Take 100 mg by mouth 3 (three) times daily.     ? iron sucrose in sodium chloride 0.9 % 100 mL Iron Sucrose (Venofer)    ?  isosorbide mononitrate (IMDUR) 60 MG 24 hr tablet TAKE 1 TABLET BY MOUTH EVERY DAY 90 tablet 3  ? lidocaine-prilocaine (EMLA) cream Apply 1 application topically Every Tuesday,Thursday,and Saturday with dialysis.    ? loperamide (IMODIUM A-D) 2 MG tablet Take 4 mg by mouth as needed for diarrhea or loose stools.    ? loratadine (CLARITIN) 10 MG tablet Take 10 mg by mouth daily.    ? loratadine (CLARITIN) 10 MG tablet Take 1 tablet by mouth daily.    ? magnesium oxide (MAG-OX) 400 MG tablet Take 400 mg by mouth daily.    ? Methoxy PEG-Epoetin Beta (MIRCERA IJ) Mircera    ? metoprolol tartrate (LOPRESSOR) 50 MG tablet TAKE 1 TABLET BY MOUTH TWICE A DAY 180 tablet 2  ? nitroGLYCERIN (NITROSTAT) 0.4 MG SL tablet Place 1 tablet (0.4 mg total) under the tongue every 5 (five) minutes  as needed for chest pain. 25 tablet 11  ? nitroGLYCERIN (NITROSTAT) 0.4 MG SL tablet Place 1 tablet under the tongue every 5 (five) minutes as needed.    ? Omega-3 Fatty Acids (FISH OIL) 1200 MG CAPS Take 2,400 mg by mouth in the morning and at bedtime.    ? omeprazole (PRILOSEC) 40 MG capsule Take 40 mg by mouth daily before breakfast.    ? Probiotic Product (ALIGN) 4 MG CAPS Take 4 mg by mouth daily.    ? promethazine (PHENERGAN) 12.5 MG tablet Take 12.5 mg by mouth every 6 (six) hours as needed for nausea or vomiting.    ? pyridOXINE (VITAMIN B-6) 100 MG tablet Take 200 mg by mouth daily.     ? sevelamer carbonate (RENVELA) 800 MG tablet Take 800 mg by mouth See admin instructions. Take 800 mg with each meal and each snack    ? sodium chloride (OCEAN) 0.65 % SOLN nasal spray Place 1 spray into both nostrils as needed for congestion.    ? ?No current facility-administered medications for this encounter.  ? ? ?Physical Findings: ? vitals were not taken for this visit.  ?Pain Assessment ?Pain Score: 0-No pain/10 ?Unable to assess due to telephone follow-up visit format. ? ?Lab Findings: ?Lab Results  ?Component Value Date  ? WBC 8.9 04/26/2020  ? HGB 10.5 (L) 02/03/2021  ? HCT 31.0 (L) 02/03/2021  ? MCV 99.7 04/26/2020  ? PLT 140 (L) 04/26/2020  ? ? ? ?Radiographic Findings: ?No results found. ? ?Impression/Plan: ?15. 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 ?She appears to have recovered well from the effects of her recent SBRT and is currently without complaints.  We discussed the plan to obtain a posttreatment CT chest scan in June 2023 to assess treatment response and pending this scan is stable, we would then proceed with serial CT chest scans every 3 to 6 months to continue to monitor for any evidence of disease regression or recurrence.  She appears to have a good understanding of these recommendations and is comfortable and in agreement  with the stated plan.  I will plan to follow-up with her by telephone following each scan to review results and recommendations but she knows that she is welcome to call at anytime in the interim with any questions or concerns. ? ? ? ?Nicholos Johns, PA-C  ?

## 2021-06-01 ENCOUNTER — Telehealth: Payer: Self-pay | Admitting: *Deleted

## 2021-06-01 NOTE — Telephone Encounter (Signed)
CALLED PATIENT TO INFORM OF CT FOR 06-11-21- ARRIVAL TIME- 11:45 AM @ [facility], NO RESTRICTIONS TO TEST, PATIENT TO RECEIVE RESULTS FROM ASHLYN BRUNING VIA TELEPHONE ON 06-25-21 @ 9:30 AM, LVM FOR A RETURN CALL

## 2021-06-02 ENCOUNTER — Telehealth: Payer: Self-pay | Admitting: *Deleted

## 2021-06-02 NOTE — Telephone Encounter (Signed)
RETURNED PATIENT'S PHONE CALL, SPOKE WITH PATIENT. ?

## 2021-06-10 ENCOUNTER — Ambulatory Visit (HOSPITAL_COMMUNITY)
Admission: RE | Admit: 2021-06-10 | Discharge: 2021-06-10 | Disposition: A | Discharge status: Home / Self Care | Payer: Medicare PPO | Source: Ambulatory Visit | Attending: Urology | Admitting: Urology

## 2021-06-10 DIAGNOSIS — D3A090 Benign carcinoid tumor of the bronchus and lung: Secondary | ICD-10-CM | POA: Insufficient documentation

## 2021-06-11 ENCOUNTER — Other Ambulatory Visit (HOSPITAL_COMMUNITY): Payer: Medicare PPO

## 2021-06-24 ENCOUNTER — Encounter: Payer: Self-pay | Admitting: Urology

## 2021-06-24 NOTE — Progress Notes (Signed)
Telephone appointment. I verified patient's identity and began nursing interview. Patient reports some occasional SOB, but is otherwise doing okay. No other issues reported at this time.  Meaningful use complete.  Reminded patient of her 9:30am-06/25/21 telephone appointment w/ Ashlyn Bruning PA-C. I left my extension [phone] in case patient needs anything. Patient verbalized understanding.  Patient contact [phone]

## 2021-06-25 ENCOUNTER — Ambulatory Visit
Admission: RE | Admit: 2021-06-25 | Discharge: 2021-06-25 | Disposition: A | Discharge status: Home / Self Care | Payer: Medicare PPO | Source: Ambulatory Visit | Attending: Urology | Admitting: Urology

## 2021-06-25 DIAGNOSIS — D3A090 Benign carcinoid tumor of the bronchus and lung: Secondary | ICD-10-CM

## 2021-06-26 NOTE — Progress Notes (Signed)
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  Narrative:  I spoke with the patient to conduct her routine scheduled follow up visit via telephone to spare the patient unnecessary potential exposure in the healthcare setting during the current COVID-19 pandemic.  The patient was notified in advance and gave permission to proceed with this visit format.  She tolerated radiation treatment relatively well without any ill side effects and remains without complaints.  She had a recent posttreatment CT chest on 06/10/2021 which shows stable, bilateral pulmonary nodules without any evidence of progressive or metastatic disease.   We reviewed these results today.                          On review of systems, the patient states that she is doing very well in general and remains without complaints.  She continues with a chronic cough but reports that this is unchanged and she specifically denies increased shortness of breath, chest pain, fever, chills or night sweats.  She feels like her energy level has returned to her baseline and overall, she is pleased with her progress to date.  ALLERGIES:  is allergic to sulfa antibiotics, erythromycin, pravastatin, simvastatin, codeine, and gemfibrozil.  Meds: Current Outpatient Medications  Medication Sig Dispense Refill   acetaminophen (TYLENOL) 500 MG tablet Take 1,000 mg by mouth every 6 (six) hours as needed for moderate pain.      Alpha-Lipoic Acid 600 MG CAPS Take 600 mg by mouth daily.     aspirin 81 MG tablet Take 81 mg by mouth at bedtime.     atorvastatin (LIPITOR) 80 MG tablet Take 1 tablet (80 mg total) by mouth at bedtime. 90 tablet 0   benzonatate (TESSALON) 100 MG capsule Take 100-200 mg by mouth 3 (three) times daily as needed for cough.     calcitRIOL (ROCALTROL) 0.25 MCG capsule Take by mouth.     cinacalcet (SENSIPAR) 30 MG tablet TAKE 1 TABLET BY MOUTH THREE  DAYS (TIMES) A WEEK  AFTER DIALYSIS     cyanocobalamin (,VITAMIN B-12,) 1000 MCG/ML injection Inject 1 mL (1,000 mcg total) into the muscle every 30 (thirty) days for 90 doses. 3 mL 29   Darbepoetin Alfa (ARANESP) 60 MCG/0.3ML SOSY injection Inject 0.3 mLs (60 mcg total) into the vein every Tuesday with hemodialysis. 4.2 mL    estradiol (ESTRACE) 0.1 MG/GM vaginal cream Place 1 Applicatorful vaginally 3 (three) times a week.     famotidine (PEPCID) 20 MG tablet Take 1 tablet (20 mg total) by mouth at bedtime. 30 tablet 2   fluticasone (FLONASE) 50 MCG/ACT nasal spray Place 1 spray into both nostrils daily as needed for allergies or rhinitis.     furosemide (LASIX) 20 MG tablet Take 20 mg by mouth 2 (two) times a week. Tues and Saturday night     gabapentin (NEURONTIN)  100 MG capsule Take 100 mg by mouth 3 (three) times daily.      iron sucrose in sodium chloride 0.9 % 100 mL Iron Sucrose (Venofer)     isosorbide mononitrate (IMDUR) 60 MG 24 hr tablet TAKE 1 TABLET BY MOUTH EVERY DAY 90 tablet 3   lidocaine-prilocaine (EMLA) cream Apply 1 application topically Every Tuesday,Thursday,and Saturday with dialysis.     loperamide (IMODIUM A-D) 2 MG tablet Take 4 mg by mouth as needed for diarrhea or loose stools.     loratadine (CLARITIN) 10 MG tablet Take 10 mg by mouth daily.     loratadine (CLARITIN) 10 MG tablet Take 1 tablet by mouth daily.     magnesium oxide (MAG-OX) 400 MG tablet Take 400 mg by mouth daily.     Methoxy PEG-Epoetin Beta (MIRCERA IJ)  Mircera     metoprolol tartrate (LOPRESSOR) 50 MG tablet TAKE 1 TABLET BY MOUTH TWICE A DAY 180 tablet 2   nitroGLYCERIN (NITROSTAT) 0.4 MG SL tablet Place 1 tablet (0.4 mg total) under the tongue every 5 (five) minutes as needed for chest pain. 25 tablet 11   nitroGLYCERIN (NITROSTAT) 0.4 MG SL tablet Place 1 tablet under the tongue every 5 (five) minutes as needed.     Omega-3 Fatty Acids (FISH OIL) 1200 MG CAPS Take 2,400 mg by mouth in the morning and at bedtime.     omeprazole (PRILOSEC) 40 MG capsule Take 40 mg by mouth daily before breakfast.     Probiotic Product (ALIGN) 4 MG CAPS Take 4 mg by mouth daily.     promethazine (PHENERGAN) 12.5 MG tablet Take 12.5 mg by mouth every 6 (six) hours as needed for nausea or vomiting.     pyridOXINE (VITAMIN B-6) 100 MG tablet Take 200 mg by mouth daily.      sevelamer carbonate (RENVELA) 800 MG tablet Take 800 mg by mouth See admin instructions. Take 800 mg with each meal and each snack     sodium chloride (OCEAN) 0.65 % SOLN nasal spray Place 1 spray into both nostrils as needed for congestion.     No current facility-administered medications for this encounter.    Physical Findings:  vitals were not taken for this visit.  Pain Assessment Pain Score: 0-No pain/10 Unable to assess due to telephone follow-up visit format.  Lab Findings: Lab Results  Component Value Date   WBC 8.9 04/26/2020   HGB 10.5 (L) 02/03/2021   HCT 31.0 (L) 02/03/2021   MCV 99.7 04/26/2020   PLT 140 (L) 04/26/2020     Radiographic Findings: CT Chest Wo Contrast  Result Date: 06/12/2021 CLINICAL DATA:  Lung neuroendocrine tumor restaging, status post recent radiation therapy. * Tracking Code: BO * EXAM: CT CHEST WITHOUT CONTRAST TECHNIQUE: Multidetector CT imaging of the chest was performed following the standard protocol without IV contrast. RADIATION DOSE REDUCTION: This exam was performed according to the departmental dose-optimization program which includes  automated exposure control, adjustment of the mA and/or kV according to patient size and/or use of iterative reconstruction technique. COMPARISON:  01/30/2021. FINDINGS: Cardiovascular: Atherosclerotic calcification of the aorta and aortic valve. Pulmonic trunk is enlarged. Heart is at the upper limits of normal in size to mildly enlarged. No pericardial effusion. Mediastinum/Nodes: Minimally hypodense lesions in the thyroid measure up to 11 mm on the left. No follow-up recommended. (Ref: J Am Coll Radiol. 2015 Feb;12(2): 143-50).No pathologically enlarged mediastinal or axillary lymph nodes. Hilar regions are difficult to definitively evaluate without IV contrast.  Esophagus is grossly unremarkable. Lungs/Pleura: Bilateral pulmonary nodules measure up to 1.0 x 1.7 cm in the medial right middle lobe (5/84), unchanged from 01/30/2021. Scarring in the lingula. New platelike density in the superior segment right lower lobe may be treatment related. No pleural fluid. Airway is unremarkable. Upper Abdomen: Visualized portions of the liver and adrenal glands are unremarkable. Cholecystectomy. Stones in the right kidney. Hypodense and isodense lesions in the kidneys measure up to 1 cm on the left, too small to characterize. No specific follow-up necessary. Visualized portions of the spleen, pancreas, stomach and bowel are grossly unremarkable. Peripherally calcified splenic artery aneurysms measure up to 12 mm. Calcified soft tissue in the left suprarenal fossa, unchanged. Musculoskeletal: Degenerative changes in the spine. No worrisome lytic or sclerotic lesions. IMPRESSION: 1. Stable bilateral pulmonary nodules, compatible with the given history of carcinoid. 2. Right renal stones. 3. Splenic artery aneurysms. 4.  Aortic atherosclerosis (ICD10-I70.0). 5. Enlarged pulmonic trunk, indicative of pulmonary arterial hypertension. Electronically Signed   By: Lorin Picket M.D.   On: 06/12/2021 08:13     Impression/Plan: 33. 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 She appears to have recovered well from the effects of her recent SBRT and is currently without complaints.  Her recent CT chest appears stable, without any evidence of disease progression.  She is scheduled for a repeat CT super D chest on 08/26/2021 to continue to monitor for any evidence of disease progression or recurrence and will follow-up with Dr. Valeta Harms thereafter.  We discussed that while we are happy to continue to participate in her care if clinically indicated, at this point, we will plan to see her back on an as-needed basis.  She will continue in routine follow-up under the care and direction of Dr. Valeta Harms.  She appears to have a good understanding of these recommendations and is comfortable and in agreement with the stated plan. She knows that she is welcome to call at anytime with any questions or concerns related to her previous radiation.  Given current concerns for patient exposure during the COVID-19 pandemic, this encounter was conducted via telephone. The patient was notified in advance and was offered a [place] meeting to allow for face to face communication but unfortunately reported that she did not have the appropriate resources/technology to support such a visit and instead preferred to proceed with telephone consult. The patient has given verbal consent for this type of encounter. The time spent during this encounter was 20 minutes. The attendants for this meeting include Wilburta Milbourn PA-C, and patient, Lakedra "Noell Lorensen. During the encounter, Shyam Dawson PA-C, was located at [facility] Rad[facility] On[facility] Department.  Patient, Tirsa "[name]" Mathisen was located at home.    Nicholos Johns, PA-C

## 2021-08-26 ENCOUNTER — Ambulatory Visit (HOSPITAL_BASED_OUTPATIENT_CLINIC_OR_DEPARTMENT_OTHER)
Admission: RE | Admit: 2021-08-26 | Discharge: 2021-08-26 | Disposition: A | Discharge status: Home / Self Care | Payer: Medicare PPO | Source: Ambulatory Visit | Attending: Pulmonary Disease | Admitting: Pulmonary Disease

## 2021-08-26 DIAGNOSIS — R918 Other nonspecific abnormal finding of lung field: Secondary | ICD-10-CM | POA: Insufficient documentation

## 2021-08-26 DIAGNOSIS — I700 Atherosclerosis of aorta: Secondary | ICD-10-CM | POA: Diagnosis not present

## 2021-08-26 DIAGNOSIS — D3A090 Benign carcinoid tumor of the bronchus and lung: Secondary | ICD-10-CM | POA: Insufficient documentation

## 2021-08-26 DIAGNOSIS — J439 Emphysema, unspecified: Secondary | ICD-10-CM | POA: Diagnosis not present

## 2021-08-31 NOTE — Progress Notes (Signed)
Tay, please let patient know I have reviewed her CT chest. Her nodules are stable. I do however think she needs a repeat ct chest in 6 months to follow up on the paratracheal lymphnode.   Please order repeat ct chest for 6 months and ensure appt to be scheduled with APP to review results.   Thanks,  BLI  Garner Nash, DO [facility] 08/31/2021 8:38 PM

## 2021-09-26 ENCOUNTER — Other Ambulatory Visit: Payer: Self-pay | Admitting: Cardiology

## 2021-09-29 ENCOUNTER — Ambulatory Visit (HOSPITAL_BASED_OUTPATIENT_CLINIC_OR_DEPARTMENT_OTHER)
Admission: RE | Admit: 2021-09-29 | Discharge: 2021-09-29 | Disposition: A | Discharge status: Home / Self Care | Payer: Medicare PPO | Source: Ambulatory Visit | Attending: Physician Assistant | Admitting: Physician Assistant

## 2021-09-29 ENCOUNTER — Other Ambulatory Visit (HOSPITAL_BASED_OUTPATIENT_CLINIC_OR_DEPARTMENT_OTHER): Payer: Self-pay | Admitting: Physician Assistant

## 2021-09-29 DIAGNOSIS — R051 Acute cough: Secondary | ICD-10-CM | POA: Insufficient documentation

## 2021-10-13 ENCOUNTER — Encounter (HOSPITAL_BASED_OUTPATIENT_CLINIC_OR_DEPARTMENT_OTHER): Payer: Self-pay

## 2021-10-13 ENCOUNTER — Encounter (HOSPITAL_COMMUNITY)
Admission: EM | Disposition: A | Discharge status: Home / Self Care | Payer: Self-pay | Source: Home / Self Care | Attending: Emergency Medicine

## 2021-10-13 ENCOUNTER — Observation Stay (HOSPITAL_BASED_OUTPATIENT_CLINIC_OR_DEPARTMENT_OTHER)
Admission: EM | Admit: 2021-10-13 | Discharge: 2021-10-14 | Disposition: A | Discharge status: Home / Self Care | Payer: Medicare PPO | Attending: Family Medicine | Admitting: Family Medicine

## 2021-10-13 ENCOUNTER — Other Ambulatory Visit: Payer: Self-pay

## 2021-10-13 ENCOUNTER — Encounter (HOSPITAL_COMMUNITY): Payer: Self-pay

## 2021-10-13 ENCOUNTER — Inpatient Hospital Stay (HOSPITAL_BASED_OUTPATIENT_CLINIC_OR_DEPARTMENT_OTHER): Payer: Medicare PPO

## 2021-10-13 ENCOUNTER — Emergency Department (HOSPITAL_BASED_OUTPATIENT_CLINIC_OR_DEPARTMENT_OTHER): Payer: Medicare PPO | Admitting: Radiology

## 2021-10-13 DIAGNOSIS — Z951 Presence of aortocoronary bypass graft: Secondary | ICD-10-CM | POA: Diagnosis not present

## 2021-10-13 DIAGNOSIS — I214 Non-ST elevation (NSTEMI) myocardial infarction: Secondary | ICD-10-CM

## 2021-10-13 DIAGNOSIS — E1169 Type 2 diabetes mellitus with other specified complication: Secondary | ICD-10-CM | POA: Diagnosis present

## 2021-10-13 DIAGNOSIS — E1122 Type 2 diabetes mellitus with diabetic chronic kidney disease: Secondary | ICD-10-CM | POA: Diagnosis not present

## 2021-10-13 DIAGNOSIS — I2510 Atherosclerotic heart disease of native coronary artery without angina pectoris: Secondary | ICD-10-CM | POA: Insufficient documentation

## 2021-10-13 DIAGNOSIS — Z85118 Personal history of other malignant neoplasm of bronchus and lung: Secondary | ICD-10-CM | POA: Diagnosis not present

## 2021-10-13 DIAGNOSIS — J209 Acute bronchitis, unspecified: Secondary | ICD-10-CM | POA: Diagnosis not present

## 2021-10-13 DIAGNOSIS — E1129 Type 2 diabetes mellitus with other diabetic kidney complication: Secondary | ICD-10-CM | POA: Diagnosis present

## 2021-10-13 DIAGNOSIS — Z79899 Other long term (current) drug therapy: Secondary | ICD-10-CM | POA: Insufficient documentation

## 2021-10-13 DIAGNOSIS — D3A090 Benign carcinoid tumor of the bronchus and lung: Secondary | ICD-10-CM | POA: Diagnosis present

## 2021-10-13 DIAGNOSIS — J9601 Acute respiratory failure with hypoxia: Secondary | ICD-10-CM

## 2021-10-13 DIAGNOSIS — Z85528 Personal history of other malignant neoplasm of kidney: Secondary | ICD-10-CM | POA: Diagnosis not present

## 2021-10-13 DIAGNOSIS — Z8542 Personal history of malignant neoplasm of other parts of uterus: Secondary | ICD-10-CM | POA: Diagnosis not present

## 2021-10-13 DIAGNOSIS — D631 Anemia in chronic kidney disease: Secondary | ICD-10-CM | POA: Diagnosis not present

## 2021-10-13 DIAGNOSIS — Z20822 Contact with and (suspected) exposure to covid-19: Secondary | ICD-10-CM | POA: Insufficient documentation

## 2021-10-13 DIAGNOSIS — N186 End stage renal disease: Secondary | ICD-10-CM

## 2021-10-13 DIAGNOSIS — E785 Hyperlipidemia, unspecified: Secondary | ICD-10-CM | POA: Diagnosis present

## 2021-10-13 DIAGNOSIS — I25810 Atherosclerosis of coronary artery bypass graft(s) without angina pectoris: Secondary | ICD-10-CM | POA: Diagnosis present

## 2021-10-13 DIAGNOSIS — Z992 Dependence on renal dialysis: Secondary | ICD-10-CM | POA: Insufficient documentation

## 2021-10-13 DIAGNOSIS — E875 Hyperkalemia: Secondary | ICD-10-CM | POA: Diagnosis not present

## 2021-10-13 DIAGNOSIS — Z7982 Long term (current) use of aspirin: Secondary | ICD-10-CM | POA: Diagnosis not present

## 2021-10-13 DIAGNOSIS — D72829 Elevated white blood cell count, unspecified: Secondary | ICD-10-CM | POA: Insufficient documentation

## 2021-10-13 DIAGNOSIS — I120 Hypertensive chronic kidney disease with stage 5 chronic kidney disease or end stage renal disease: Secondary | ICD-10-CM | POA: Diagnosis not present

## 2021-10-13 DIAGNOSIS — I10 Essential (primary) hypertension: Secondary | ICD-10-CM | POA: Diagnosis present

## 2021-10-13 DIAGNOSIS — J45909 Unspecified asthma, uncomplicated: Secondary | ICD-10-CM | POA: Insufficient documentation

## 2021-10-13 DIAGNOSIS — C7801 Secondary malignant neoplasm of right lung: Secondary | ICD-10-CM | POA: Diagnosis present

## 2021-10-13 DIAGNOSIS — R079 Chest pain, unspecified: Secondary | ICD-10-CM | POA: Diagnosis present

## 2021-10-13 DIAGNOSIS — Z8673 Personal history of transient ischemic attack (TIA), and cerebral infarction without residual deficits: Secondary | ICD-10-CM | POA: Insufficient documentation

## 2021-10-13 HISTORY — PX: LEFT HEART CATH AND CORS/GRAFTS ANGIOGRAPHY: CATH118250

## 2021-10-13 LAB — ECHOCARDIOGRAM COMPLETE
AR max vel: 2.22 cm2
AV Area VTI: 2.47 cm2
AV Area mean vel: 2.24 cm2
AV Mean grad: 4 mmHg
AV Peak grad: 7.7 mmHg
Ao pk vel: 1.39 m/s
Area-P 1/2: 2.55 cm2
Calc EF: 64.7 %
Height: 60 in
MV M vel: 4.11 m/s
MV Peak grad: 67.6 mmHg
S' Lateral: 2.5 cm
Single Plane A2C EF: 70.5 %
Single Plane A4C EF: 56.5 %
Weight: 2218.71 oz

## 2021-10-13 LAB — BASIC METABOLIC PANEL
Anion gap: 11 (ref 5–15)
BUN: 57 mg/dL — ABNORMAL HIGH (ref 8–23)
CO2: 27 mmol/L (ref 22–32)
Calcium: 8.7 mg/dL — ABNORMAL LOW (ref 8.9–10.3)
Chloride: 98 mmol/L (ref 98–111)
Creatinine, Ser: 4.71 mg/dL — ABNORMAL HIGH (ref 0.44–1.00)
GFR, Estimated: 9 mL/min — ABNORMAL LOW (ref >60)
Glucose, Bld: 188 mg/dL — ABNORMAL HIGH (ref 70–99)
Potassium: 6 mmol/L — ABNORMAL HIGH (ref 3.5–5.1)
Sodium: 136 mmol/L (ref 135–145)

## 2021-10-13 LAB — MAGNESIUM: Magnesium: 2.8 mg/dL — ABNORMAL HIGH (ref 1.7–2.4)

## 2021-10-13 LAB — CBC
HCT: 33.5 % — ABNORMAL LOW (ref 36.0–46.0)
Hemoglobin: 11.5 g/dL — ABNORMAL LOW (ref 12.0–15.0)
MCH: 37.1 pg — ABNORMAL HIGH (ref 26.0–34.0)
MCHC: 34.3 g/dL (ref 30.0–36.0)
MCV: 108.1 fL — ABNORMAL HIGH (ref 80.0–100.0)
Platelets: 188 10*3/uL (ref 150–400)
RBC: 3.1 MIL/uL — ABNORMAL LOW (ref 3.87–5.11)
RDW: 15.8 % — ABNORMAL HIGH (ref 11.5–15.5)
WBC: 13.9 10*3/uL — ABNORMAL HIGH (ref 4.0–10.5)
nRBC: 0 % (ref 0.0–0.2)

## 2021-10-13 LAB — RESP PANEL BY RT-PCR (FLU A&B, COVID) ARPGX2
Influenza A by PCR: NEGATIVE
Influenza B by PCR: NEGATIVE
SARS Coronavirus 2 by RT PCR: NEGATIVE

## 2021-10-13 LAB — C-REACTIVE PROTEIN: CRP: 2.1 mg/dL — ABNORMAL HIGH (ref <1.0)

## 2021-10-13 LAB — HEPATITIS C ANTIBODY: HCV Ab: NONREACTIVE

## 2021-10-13 LAB — HEMOGLOBIN A1C
Hgb A1c MFr Bld: 5.1 % (ref 4.8–5.6)
Mean Plasma Glucose: 99.67 mg/dL

## 2021-10-13 LAB — HEPATITIS B CORE ANTIBODY, TOTAL: Hep B Core Total Ab: NONREACTIVE

## 2021-10-13 LAB — CREATININE, SERUM
Creatinine, Ser: 4.84 mg/dL — ABNORMAL HIGH (ref 0.44–1.00)
GFR, Estimated: 9 mL/min — ABNORMAL LOW (ref >60)

## 2021-10-13 LAB — TROPONIN I (HIGH SENSITIVITY)
Troponin I (High Sensitivity): 51 ng/L — ABNORMAL HIGH (ref <18)
Troponin I (High Sensitivity): 1478 ng/L (ref <18)

## 2021-10-13 LAB — BRAIN NATRIURETIC PEPTIDE: B Natriuretic Peptide: 898.8 pg/mL — ABNORMAL HIGH (ref 0.0–100.0)

## 2021-10-13 LAB — HEPATITIS B SURFACE ANTIGEN: Hepatitis B Surface Ag: NONREACTIVE

## 2021-10-13 LAB — CBG MONITORING, ED
Glucose-Capillary: 112 mg/dL — ABNORMAL HIGH (ref 70–99)
Glucose-Capillary: 89 mg/dL (ref 70–99)

## 2021-10-13 LAB — HEPATITIS B SURFACE ANTIBODY,QUALITATIVE: Hep B S Ab: NONREACTIVE

## 2021-10-13 LAB — GLUCOSE, CAPILLARY
Glucose-Capillary: 90 mg/dL (ref 70–99)
Glucose-Capillary: 210 mg/dL — ABNORMAL HIGH (ref 70–99)

## 2021-10-13 SURGERY — LEFT HEART CATH AND CORS/GRAFTS ANGIOGRAPHY
Anesthesia: LOCAL

## 2021-10-13 MED ORDER — SODIUM CHLORIDE 0.9% FLUSH: 3.0000 mL | INTRAVENOUS | Status: DC | PRN

## 2021-10-13 MED ORDER — FENTANYL CITRATE (PF) 100 MCG/2ML IJ SOLN
INTRAMUSCULAR | Status: AC
  Filled 2021-10-13: qty 2

## 2021-10-13 MED ORDER — ATORVASTATIN CALCIUM 80 MG PO TABS
80.0000 mg | ORAL_TABLET | Freq: Every day | ORAL | Status: DC
  Administered 2021-10-13: 80 mg via ORAL
  Filled 2021-10-13: qty 1

## 2021-10-13 MED ORDER — SODIUM CHLORIDE 0.9 % IV SOLN: INTRAVENOUS | Status: DC

## 2021-10-13 MED ORDER — HYDRALAZINE HCL 20 MG/ML IJ SOLN: 10.0000 mg | INTRAMUSCULAR | Status: AC | PRN

## 2021-10-13 MED ORDER — SODIUM CHLORIDE 0.9 % IV SOLN: INTRAVENOUS | Status: AC

## 2021-10-13 MED ORDER — HEPARIN SODIUM (PORCINE) 5000 UNIT/ML IJ SOLN
5000.0000 [IU] | Freq: Three times a day (TID) | INTRAMUSCULAR | Status: DC
  Administered 2021-10-13 – 2021-10-14 (×2): 5000 [IU] via SUBCUTANEOUS
  Filled 2021-10-13 (×2): qty 1

## 2021-10-13 MED ORDER — PANTOPRAZOLE SODIUM 40 MG PO TBEC
40.0000 mg | DELAYED_RELEASE_TABLET | Freq: Every day | ORAL | Status: DC
  Administered 2021-10-13 – 2021-10-14 (×2): 40 mg via ORAL
  Filled 2021-10-13 (×2): qty 1

## 2021-10-13 MED ORDER — FENTANYL CITRATE (PF) 100 MCG/2ML IJ SOLN
INTRAMUSCULAR | Status: DC | PRN
  Administered 2021-10-13: 25 ug via INTRAVENOUS

## 2021-10-13 MED ORDER — ASPIRIN 81 MG PO CHEW
324.0000 mg | CHEWABLE_TABLET | Freq: Once | ORAL | Status: AC
  Administered 2021-10-13: 324 mg via ORAL
  Filled 2021-10-13: qty 4

## 2021-10-13 MED ORDER — MIDAZOLAM HCL 2 MG/2ML IJ SOLN
INTRAMUSCULAR | Status: DC | PRN
  Administered 2021-10-13: 1 mg via INTRAVENOUS

## 2021-10-13 MED ORDER — IOHEXOL 350 MG/ML SOLN
INTRAVENOUS | Status: DC | PRN
  Administered 2021-10-13: 95 mL

## 2021-10-13 MED ORDER — SEVELAMER CARBONATE 800 MG PO TABS: 800.0000 mg | ORAL_TABLET | ORAL | Status: DC | PRN

## 2021-10-13 MED ORDER — LIDOCAINE HCL (PF) 1 % IJ SOLN
INTRAMUSCULAR | Status: DC | PRN
  Administered 2021-10-13: 12 mL

## 2021-10-13 MED ORDER — LABETALOL HCL 5 MG/ML IV SOLN: 10.0000 mg | INTRAVENOUS | Status: AC | PRN

## 2021-10-13 MED ORDER — SODIUM CHLORIDE 0.9 % IV SOLN: 250.0000 mL | INTRAVENOUS | Status: DC | PRN

## 2021-10-13 MED ORDER — HEPARIN (PORCINE) IN NACL 1000-0.9 UT/500ML-% IV SOLN
INTRAVENOUS | Status: DC | PRN
  Administered 2021-10-13 (×2): 500 mL

## 2021-10-13 MED ORDER — MAGNESIUM OXIDE -MG SUPPLEMENT 400 (240 MG) MG PO TABS
400.0000 mg | ORAL_TABLET | Freq: Every day | ORAL | Status: DC
  Administered 2021-10-13 – 2021-10-14 (×2): 400 mg via ORAL
  Filled 2021-10-13 (×2): qty 1

## 2021-10-13 MED ORDER — METOPROLOL TARTRATE 50 MG PO TABS
50.0000 mg | ORAL_TABLET | Freq: Two times a day (BID) | ORAL | Status: DC
  Administered 2021-10-13 – 2021-10-14 (×3): 50 mg via ORAL
  Filled 2021-10-13: qty 2
  Filled 2021-10-13 (×2): qty 1

## 2021-10-13 MED ORDER — NITROGLYCERIN 0.4 MG SL SUBL
0.4000 mg | SUBLINGUAL_TABLET | SUBLINGUAL | Status: DC | PRN
  Administered 2021-10-13: 0.4 mg via SUBLINGUAL
  Filled 2021-10-13: qty 1

## 2021-10-13 MED ORDER — HEPARIN (PORCINE) IN NACL 1000-0.9 UT/500ML-% IV SOLN
INTRAVENOUS | Status: AC
  Filled 2021-10-13: qty 1000

## 2021-10-13 MED ORDER — SODIUM CHLORIDE 0.9 % IV SOLN
INTRAVENOUS | Status: AC | PRN
  Administered 2021-10-13: 10 mL/h via INTRAVENOUS

## 2021-10-13 MED ORDER — CHLORHEXIDINE GLUCONATE CLOTH 2 % EX PADS: 6.0000 | MEDICATED_PAD | Freq: Every day | CUTANEOUS | Status: DC

## 2021-10-13 MED ORDER — SODIUM CHLORIDE 0.9% FLUSH
3.0000 mL | Freq: Two times a day (BID) | INTRAVENOUS | Status: DC
  Administered 2021-10-13 – 2021-10-14 (×2): 3 mL via INTRAVENOUS

## 2021-10-13 MED ORDER — ISOSORBIDE MONONITRATE ER 60 MG PO TB24
60.0000 mg | ORAL_TABLET | Freq: Every day | ORAL | Status: DC
  Administered 2021-10-13 – 2021-10-14 (×2): 60 mg via ORAL
  Filled 2021-10-13: qty 1
  Filled 2021-10-13: qty 2

## 2021-10-13 MED ORDER — SEVELAMER CARBONATE 800 MG PO TABS
800.0000 mg | ORAL_TABLET | Freq: Three times a day (TID) | ORAL | Status: DC
  Administered 2021-10-13 – 2021-10-14 (×4): 800 mg via ORAL
  Filled 2021-10-13 (×3): qty 1

## 2021-10-13 MED ORDER — ASPIRIN 81 MG PO CHEW
81.0000 mg | CHEWABLE_TABLET | Freq: Every day | ORAL | Status: DC
  Administered 2021-10-13: 81 mg via ORAL
  Filled 2021-10-13: qty 1

## 2021-10-13 MED ORDER — LIDOCAINE HCL (PF) 1 % IJ SOLN
INTRAMUSCULAR | Status: AC
  Filled 2021-10-13: qty 30

## 2021-10-13 MED ORDER — INSULIN ASPART 100 UNIT/ML IJ SOLN: 0.0000 [IU] | INTRAMUSCULAR | Status: DC

## 2021-10-13 MED ORDER — MIDAZOLAM HCL 2 MG/2ML IJ SOLN
INTRAMUSCULAR | Status: AC
  Filled 2021-10-13: qty 2

## 2021-10-13 MED ORDER — OXYCODONE HCL 5 MG PO TABS: 5.0000 mg | ORAL_TABLET | ORAL | Status: DC | PRN

## 2021-10-13 MED ORDER — HEPARIN BOLUS VIA INFUSION
2000.0000 [IU] | Freq: Once | INTRAVENOUS | Status: AC
  Administered 2021-10-13: 2000 [IU] via INTRAVENOUS

## 2021-10-13 MED ORDER — ONDANSETRON HCL 4 MG/2ML IJ SOLN: 4.0000 mg | Freq: Four times a day (QID) | INTRAMUSCULAR | Status: DC | PRN

## 2021-10-13 MED ORDER — HEPARIN (PORCINE) 25000 UT/250ML-% IV SOLN
700.0000 [IU]/h | INTRAVENOUS | Status: DC
  Administered 2021-10-13: 700 [IU]/h via INTRAVENOUS
  Filled 2021-10-13: qty 250

## 2021-10-13 MED ORDER — ACETAMINOPHEN 325 MG PO TABS: 650.0000 mg | ORAL_TABLET | ORAL | Status: DC | PRN

## 2021-10-13 MED ORDER — SODIUM CHLORIDE 0.9% FLUSH
3.0000 mL | Freq: Two times a day (BID) | INTRAVENOUS | Status: DC
  Administered 2021-10-13 – 2021-10-14 (×3): 3 mL via INTRAVENOUS

## 2021-10-13 SURGICAL SUPPLY — 10 items
CATH INFINITI 5 FR IM (CATHETERS) IMPLANT
CATH INFINITI 5FR MPB2 (CATHETERS) IMPLANT
CATH INFINITI 5FR MULTPACK ANG (CATHETERS) IMPLANT
KIT HEART LEFT (KITS) ×2 IMPLANT
MAT PREVALON FULL STRYKER (MISCELLANEOUS) IMPLANT
PACK CARDIAC CATHETERIZATION (CUSTOM PROCEDURE TRAY) ×2 IMPLANT
SHEATH PINNACLE 5F 10CM (SHEATH) IMPLANT
SHEATH PROBE COVER 6X72 (BAG) IMPLANT
TRANSDUCER W/STOPCOCK (MISCELLANEOUS) ×2 IMPLANT
WIRE EMERALD 3MM-J .035X150CM (WIRE) IMPLANT

## 2021-10-13 NOTE — Procedures (Signed)
   I was present at this dialysis session, have reviewed the session itself and made  appropriate changes Kelly Splinter MD [place] pager [phone]   10/13/2021, 12:25 PM

## 2021-10-13 NOTE — Progress Notes (Signed)
ANTICOAGULATION CONSULT NOTE - Initial Consult  Pharmacy Consult for heparin Indication: chest pain/ACS  Allergies  Allergen Reactions   Sulfa Antibiotics Other (See Comments)    "AKI"    Erythromycin Itching   Pravastatin Other (See Comments)    Unknown reaction   Simvastatin Other (See Comments)    Reaction not known   Codeine Nausea Only and Other (See Comments)    Tolerate Hycodan   Gemfibrozil Itching    Patient Measurements: Height: 5' (152.4 cm) Weight: 63.5 kg (140 lb) IBW/kg (Calculated) : 45.5 Heparin Dosing Weight: 60kg  Vital Signs: Temp: 98 F (36.7 C) (10/03 0033) Temp Source: Oral (10/03 0033) BP: 129/72 (10/03 0245) Pulse Rate: 84 (10/03 0245)  Labs: Recent Labs    10/13/21 0026 10/13/21 0234  HGB 11.5*  --   HCT 33.5*  --   PLT 188  --   CREATININE 4.71*  --   TROPONINIHS 51* 1,478*    Estimated Creatinine Clearance: 8.5 mL/min (A) (by C-G formula based on SCr of 4.71 mg/dL (H)).   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  95% ost cX, 80-90% PDA --> CABG X3; b) Post CABG CATH for + Myoview with basal anterior ischemia -> Ost LAD 80% (diffuse) then 100% after SP2, RI 70% (too small for PCI), Ost-prox Cx 99% & OM1 90%, OstrPDA 80% (small); Occluded SVG-rPDA.  Patent LIMA-dLAD, SVG-OM: culprit ~ p-m LAD pre-LIMA & RI - not good PCI target --> Med Rx   CKD (chronic kidney disease) stage 3, GFR 30-59 ml/min (HCC)    Degenerative disc disease, cervical    Degenerative disc disease, cervical [722.4]   Diabetes mellitus without complication (HCC)    Type II   Dyspnea    Endometrial cancer ([place]) 11/2013   Treated with TAH with pelvic lymphadenectomy followed by radiation and chemotherapy    GERD (gastroesophageal reflux disease)    Hearing loss    bilateral - no hearing aids   Heart murmur    History of asthma     childhood   History of blood transfusion    History of pneumonia    "2-3 times"   History of unstable angina November/December 2012   T wave inversions in inferolateral leads.  No stress test performed.   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  Goal of Therapy:  Heparin level 0.3-0.7 units/ml Monitor platelets by anticoagulation protocol: Yes   Plan:  Heparin 2000 units IV bolus x1 followed by infusion at 700 units/hr. Monitor heparin levels and CBC.  Wynona Neat, PharmD, BCPS  10/13/2021,3:14 AM

## 2021-10-13 NOTE — ED Notes (Signed)
Pt arrived to [facility] ED.  

## 2021-10-13 NOTE — ED Triage Notes (Addendum)
Chest pain started yesterday.   States she is "getting over pneumonia" +SHOB Hx CABG x 3  Dialysis pt

## 2021-10-13 NOTE — ED Notes (Signed)
Report given to dialysis RN

## 2021-10-13 NOTE — Progress Notes (Signed)
SLP Cancellation Note  Patient Details Name: Dana [place] MRN: [id number] DOB: 07/01/1945   Cancelled treatment:        Order for swallow evaluation received. She has been in HD this morning and read plans for cardiac cath this afternoon. Will attempt to see tomorrow 10/4 for swallow evaluation.    Houston Siren 10/13/2021, 1:11 PM

## 2021-10-13 NOTE — H&P (Signed)
History and Physical    Dana [place] INO:[id number] DOB: 09-07-1945 DOA: 10/13/2021  PCP: Heywood Bene, PA-C  Patient coming from: [facility] ED  Chief Complaint: Chest pain  HPI: Dana [place] is a 76 y.o. 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  SPO2 86-88% on room air, improved with 3 L O2.  Afebrile.  Labs showing WBC 13.9, hemoglobin 11.5 (stable), potassium 6.0, glucose 188, troponin 51> 1478, BNP 898, COVID and influenza PCR negative.  Chest x-ray showing no active disease.  Initial EKG showing new ST depressions in lateral leads which improved on subsequent EKGs. Patient was given aspirin and nitroglycerin.  Chest pain resolved with nitroglycerin.  Patient started on IV heparin and transferred from [place] ED to [facility] ED. Nephrology (Dr. Augustin Coupe) consulted and placed orders for dialysis.  Cardiology (Dr. Humphrey Rolls) consulted and planning on [facility] later in the day.  Patient states yesterday evening while cleaning her house she experienced sudden onset, severe substernal chest pain which felt like heaviness and associated with nausea and dyspnea.  States her symptoms finally improved after she was placed on oxygen in the ED and was given nitroglycerin.  She denies any chest pain at this time.  Does report history of exertional dyspnea.  Her last dialysis was on Saturday and she denies missing any treatments.  Reports recently being diagnosed with pneumonia and was treated with a 5-day course of antibiotics.  Review of Systems:  Review of Systems  All other systems reviewed and are negative.   Past Medical History:  Diagnosis Date   Anemia    Arthritis    hands   Asthma     childhood   Bacteremia due to Escherichia coli 06/2013   Currently being treated with anti-biotics   CAD in native artery 12/2010   a) Cath for exertional angina & EKG changes: 40% LM, 80% mid LAD.  95% ost cX, 80-90% PDA --> CABG X3; b) Post CABG CATH for + Myoview with basal anterior ischemia -> Ost LAD 80% (diffuse) then 100% after SP2, RI 70% (too small for PCI), Ost-prox Cx 99% & OM1 90%, OstrPDA 80% (small); Occluded SVG-rPDA.  Patent LIMA-dLAD, SVG-OM: culprit ~ p-m LAD pre-LIMA & RI - not good PCI target --> Med Rx   CKD (chronic kidney disease) stage 3, GFR 30-59 ml/min (HCC)    Degenerative disc disease, cervical    Degenerative disc disease, cervical [722.4]   Diabetes mellitus without complication (HCC)    Type II   Dyspnea    Endometrial cancer ([place]) 11/2013   Treated with TAH with pelvic lymphadenectomy followed by radiation and chemotherapy   GERD (gastroesophageal reflux disease)    Hearing loss    bilateral - no hearing aids   Heart murmur    History of asthma     childhood   History of blood transfusion    History of pneumonia    "2-3 times"   History of unstable angina November/December 2012   T wave inversions in inferolateral leads.  No stress test performed.   Hyperlipidemia LDL goal <70    Hypertension, essential, benign    Neuropathy    hands   Pneumonia 2018   Renal cell carcinoma ([place]) 11/18/2008   T2aNx  s/p partial left nephrectomy   S/P CABG x 3 12/2010   LIMA-LAD, SVG RPL, SVG-Circumflex   Stroke ([place])    TIA- no residual    TIA (transient ischemic attack) 1992 &  2010    Past Surgical History:  Procedure Laterality Date   ANTERIOR CERVICAL DECOMP/DISCECTOMY FUSION  10/11/2005   multi-level   AV FISTULA PLACEMENT Left 08/03/2017   Procedure: ARTERIOVENOUS (AV) FISTULA CREATION LEFT ARM;  Surgeon: Rosetta Posner, MD;  Location: [facility];  Service: Vascular;  Laterality: Left;   BRONCHIAL BIOPSY  12/30/2020   Procedure: BRONCHIAL BIOPSIES;  Surgeon:  Garner Nash, DO;  Location: [facility];  Service: Pulmonary;;   BRONCHIAL BIOPSY  02/03/2021   Procedure: BRONCHIAL BIOPSIES;  Surgeon: Garner Nash, DO;  Location: [facility];  Service: Pulmonary;;   BRONCHIAL BRUSHINGS  12/30/2020   Procedure: BRONCHIAL BRUSHINGS;  Surgeon: Garner Nash, DO;  Location: [facility];  Service: Pulmonary;;   BRONCHIAL BRUSHINGS  02/03/2021   Procedure: BRONCHIAL BRUSHINGS;  Surgeon: Garner Nash, DO;  Location: [facility];  Service: Pulmonary;;   BRONCHIAL NEEDLE ASPIRATION BIOPSY  12/30/2020   Procedure: BRONCHIAL NEEDLE ASPIRATION BIOPSIES;  Surgeon: Garner Nash, DO;  Location: [facility];  Service: Pulmonary;;   BRONCHIAL NEEDLE ASPIRATION BIOPSY  02/03/2021   Procedure: BRONCHIAL NEEDLE ASPIRATION BIOPSIES;  Surgeon: Garner Nash, DO;  Location: [facility];  Service: Pulmonary;;   CARDIAC CATHETERIZATION  12/24/10   40% left main, 80% mid LAD, 95% ostial circumflex, 80-90% PDA.   CARDIAC CATHETERIZATION N/A 07/01/2014   Procedure: Left Heart Cath and Cors/Grafts Angiography;  Surgeon: Leonie Man, MD;  Location: [facility] INVASIVE CV LAB:  For Abn Nuc @ [facility]: Ost LAD 80%, mid LAD 100% after S/P 2, 70% RI (no PCI target), ost-prox Cx 99%, OM1 90%. Ost rPDA 80% (~ pre-CABG), occluded SVG-rPDA, patent LIMA-dLAD, SVG OM; potential culprit for abn Nuc scan = pLAD Dz, small RI 70% or PDA -> med Rx   CAROTID ENDARTERECTOMY Left    CHOLECYSTECTOMY  ~ [place] GRAFT  12/25/2010   Procedure: CORONARY ARTERY BYPASS GRAFTING (CABGX3 - LIMA-LAD, SVG RPL, SVG-Circumflex);  Surgeon: Rexene Alberts, MD;  Location: [facility];  Service: Open Heart Surgery;  Laterality: N/A;   DILATATION & CURETTAGE/HYSTEROSCOPY WITH MYOSURE N/A 11/02/2013   Procedure: DILATATION & CURETTAGE/HYSTEROSCOPY WITH MYOSURE ABLATION;  Surgeon: Allena Katz, MD;  Location: [facility];  Service: Gynecology;  Laterality: N/A;   DOPPLER  ECHOCARDIOGRAPHY  12/08/2010   EF =>55%,MILD CONCENTRIC LEFT VENTRICULAR HYPERTROPHY   EYE SURGERY Bilateral    Cataract   IR DIALY SHUNT INTRO NEEDLE/INTRACATH INITIAL W/IMG LEFT Left 04/24/2019   IR FLUORO GUIDE CV LINE LEFT  04/26/2019   IR US GUIDE VASC ACCESS LEFT  04/24/2019   IR US GUIDE VASC ACCESS LEFT  04/26/2019   LEFT HEART CATHETERIZATION WITH CORONARY ANGIOGRAM N/A 12/24/2010   Procedure: LEFT HEART CATHETERIZATION WITH CORONARY ANGIOGRAM;  Surgeon: Leonie Man, MD;  Location: [facility] CATH LAB;  Service: Cardiovascular;  Laterality: N/A;   NM MYOCAR SINGLE W/SPECT  07/26/2007   EF 79%, LEFT VENT.FUNCTION NORMAL   PARTIAL NEPHRECTOMY Left 11/18/2008   left partial nephrectomy for renal cell CA   PET Myocardial Perfusion Scan  06/13/2014   At [place]: Moderate size, mild severity completely reversible defect involving the basal anterior, mid anterior and apical anterior segments consistent with ischemia. EF  65% with normal global function.   TONSILLECTOMY     "in college"   TOTAL ABDOMINAL HYSTERECTOMY  November 2015    At [facility]: Robotic procedure with pelvic lymphadenectomy   TRANSTHORACIC ECHOCARDIOGRAM  06/13/2014   At [place]: EF 60-65%. Grade 1 diastolic dysfunction. Mild MR. Aortic sclerosis. Moderate pulmonary hypertension   TUBAL LIGATION  ~ 1984   UPPER GI ENDOSCOPY     VIDEO BRONCHOSCOPY WITH RADIAL ENDOBRONCHIAL ULTRASOUND  12/30/2020   Procedure: RADIAL ENDOBRONCHIAL ULTRASOUND;  Surgeon: Garner Nash, DO;  Location: [facility];  Service: Pulmonary;;   VIDEO BRONCHOSCOPY WITH RADIAL ENDOBRONCHIAL ULTRASOUND  02/03/2021   Procedure: RADIAL ENDOBRONCHIAL ULTRASOUND;  Surgeon: Garner Nash, DO;  Location: [facility];  Service: Pulmonary;;   WISDOM TOOTH EXTRACTION       reports that she has never smoked. She has never used smokeless tobacco. She reports that she does not currently use alcohol. She reports that she does not use  drugs.  Allergies  Allergen Reactions   Sulfa Antibiotics Other (See Comments)    "AKI"    Erythromycin Itching   Pravastatin Other (See Comments)    Unknown reaction   Simvastatin Other (See Comments)    Reaction not known   Codeine Nausea Only and Other (See Comments)    Tolerate Hycodan   Gemfibrozil Itching    Family History  Problem Relation Age of Onset   AAA (abdominal aortic aneurysm) Mother    Multiple myeloma Mother    GER disease Mother    Coronary artery disease Father    Heart disease Father    Coronary artery disease Sister    Coronary artery disease Brother    Heart disease Brother    Stroke Maternal Grandmother    Heart attack Neg Hx     Prior to Admission medications   Medication Sig Start Date End Date Taking? Authorizing Provider  acetaminophen (TYLENOL) 500 MG tablet Take 1,000 mg by mouth every 6 (six) hours as needed for moderate pain.   Yes [provider]  Alpha-Lipoic Acid 600 MG CAPS Take 600 mg by mouth daily.   Yes [provider]  aspirin 81 MG tablet Take 81 mg by mouth at bedtime.   Yes [provider]  atorvastatin (LIPITOR) 80 MG tablet Take 1 tablet (80 mg total) by mouth at bedtime. 04/29/19 10/14/22 Yes [place] Eric J, DO  benzonatate (TESSALON) 100 MG capsule Take 100-200 mg by mouth 3 (three) times daily as needed for cough.   Yes [provider]  calcitRIOL (ROCALTROL) 0.25 MCG capsule Take by mouth.   Yes [provider]  cinacalcet (SENSIPAR) 30 MG tablet Take 30 mg by mouth daily with breakfast. 04/18/20  Yes [provider]  estradiol (ESTRACE) 0.1 MG/GM vaginal cream Place 1 Applicatorful vaginally 3 (three) times a week.   Yes [provider]  famotidine (PEPCID) 20 MG tablet Take 1 tablet (20 mg total) by mouth at bedtime. 04/29/19 10/14/22 Yes [place] Eric J, DO  Methoxy PEG-Epoetin Beta (MIRCERA IJ) Mircera 02/26/21 02/25/22 Yes [provider]  metoprolol  tartrate (LOPRESSOR) 50 MG tablet Take 1 tablet (50 mg total) by mouth 2 (two) times daily. schedule an appointment for further refills, 1st attempt 09/28/21  Yes Leonie Man, MD  nitroGLYCERIN (NITROSTAT) 0.4 MG SL tablet Place 1 tablet (0.4 mg total) under the tongue every 5 (five) minutes as needed for chest pain. 12/26/19  Yes Leonie Man, MD  Omega-3  Fatty Acids (FISH OIL) 1200 MG CAPS Take 2,400 mg by mouth in the morning and at bedtime.   Yes [provider]  omeprazole (PRILOSEC) 40 MG capsule Take 40 mg by mouth daily before breakfast.   Yes [provider]  Probiotic Product (ALIGN) 4 MG CAPS Take 4 mg by mouth daily.   Yes [provider]  pyridOXINE (VITAMIN B-6) 100 MG tablet Take 200 mg by mouth daily.    Yes [provider]  cyanocobalamin (,VITAMIN B-12,) 1000 MCG/ML injection Inject 1 mL (1,000 mcg total) into the muscle every 30 (thirty) days for 90 doses. Patient not taking: Reported on 10/13/2021 06/27/19 10/19/26  [place] Donnamarie Poag, DO  Darbepoetin Alfa (ARANESP) 60 MCG/0.3ML SOSY injection Inject 0.3 mLs (60 mcg total) into the vein every Tuesday with hemodialysis. 05/01/19   [place] Donnamarie Poag, DO  furosemide (LASIX) 20 MG tablet Take 20 mg by mouth 2 (two) times a week. Tues and Saturday night    [provider]  gabapentin (NEURONTIN) 100 MG capsule Take 100 mg by mouth 3 (three) times daily.     [provider]  iron sucrose in sodium chloride 0.9 % 100 mL Iron Sucrose (Venofer) 02/05/21 02/04/22  [provider]  isosorbide mononitrate (IMDUR) 60 MG 24 hr tablet TAKE 1 TABLET BY MOUTH EVERY DAY 02/02/21   Leonie Man, MD  lidocaine-prilocaine (EMLA) cream Apply 1 application topically Every Tuesday,Thursday,and Saturday with dialysis. 12/20/19   [provider]  loperamide (IMODIUM A-D) 2 MG tablet Take 4 mg by mouth as needed for diarrhea or loose stools. 03/18/20   [provider]  loratadine  (CLARITIN) 10 MG tablet Take 10 mg by mouth daily.    [provider]  loratadine (CLARITIN) 10 MG tablet Take 1 tablet by mouth daily.    [provider]  magnesium oxide (MAG-OX) 400 MG tablet Take 400 mg by mouth daily.    [provider]  promethazine-dextromethorphan (PROMETHAZINE-DM) 6.25-15 MG/5ML syrup Take 5 mLs by mouth 4 (four) times daily as needed for cough. Patient not taking: Reported on 10/13/2021 09/28/21   [provider]  sevelamer carbonate (RENVELA) 800 MG tablet Take 800 mg by mouth See admin instructions. Take 800 mg with each meal and each snack 09/16/19   [provider]    Physical Exam: Vitals:   10/13/21 0227 10/13/21 0245 10/13/21 0330 10/13/21 0500  BP:  129/72 116/71 138/65  Pulse:  84 83 85  Resp:  13 13 19   Temp:    98 F (36.7 C)  TempSrc:    Oral  SpO2: 97% 97% 95% 90%  Weight:      Height:        Physical Exam Vitals reviewed.  Constitutional:      General: She is not in acute distress. HENT:     Head: Normocephalic and atraumatic.  Eyes:     Extraocular Movements: Extraocular movements intact.  Cardiovascular:     Rate and Rhythm: Normal rate and regular rhythm.     Pulses: Normal pulses.  Pulmonary:     Effort: Pulmonary effort is normal. No respiratory distress.     Breath sounds: Normal breath sounds. No wheezing or rales.  Abdominal:     General: Bowel sounds are normal. There is no distension.     Palpations: Abdomen is soft.     Tenderness: There is no abdominal tenderness.  Musculoskeletal:        General: No swelling or  tenderness.     Cervical back: Normal range of motion.  Skin:    General: Skin is warm and dry.  Neurological:     General: No focal deficit present.     Mental Status: She is alert and oriented to person, place, and time.     Labs on Admission: I have personally reviewed following labs and imaging studies  CBC: Recent Labs  Lab 10/13/21 0026  WBC 13.9*  HGB  11.5*  HCT 33.5*  MCV 108.1*  PLT 253   Basic Metabolic Panel: Recent Labs  Lab 10/13/21 0026  NA 136  K 6.0*  CL 98  CO2 27  GLUCOSE 188*  BUN 57*  CREATININE 4.71*  CALCIUM 8.7*   GFR: Estimated Creatinine Clearance: 8.5 mL/min (A) (by C-G formula based on SCr of 4.71 mg/dL (H)). Liver Function Tests: No results for input(s): "AST", "ALT", "ALKPHOS", "BILITOT", "PROT", "ALBUMIN" in the last 168 hours. No results for input(s): "LIPASE", "AMYLASE" in the last 168 hours. No results for input(s): "AMMONIA" in the last 168 hours. Coagulation Profile: No results for input(s): "INR", "PROTIME" in the last 168 hours. Cardiac Enzymes: No results for input(s): "CKTOTAL", "CKMB", "CKMBINDEX", "TROPONINI" in the last 168 hours. BNP (last 3 results) No results for input(s): "PROBNP" in the last 8760 hours. HbA1C: No results for input(s): "HGBA1C" in the last 72 hours. CBG: No results for input(s): "GLUCAP" in the last 168 hours. Lipid Profile: No results for input(s): "CHOL", "HDL", "LDLCALC", "TRIG", "CHOLHDL", "LDLDIRECT" in the last 72 hours. Thyroid Function Tests: No results for input(s): "TSH", "T4TOTAL", "FREET4", "T3FREE", "THYROIDAB" in the last 72 hours. Anemia Panel: No results for input(s): "VITAMINB12", "FOLATE", "FERRITIN", "TIBC", "IRON", "RETICCTPCT" in the last 72 hours. Urine analysis:    Component Value Date/Time   COLORURINE YELLOW 04/27/2020 0100   APPEARANCEUR CLEAR 04/27/2020 0100   LABSPEC 1.014 04/27/2020 0100   PHURINE 8.5 (H) 04/27/2020 0100   GLUCOSEU NEGATIVE 04/27/2020 0100   HGBUR NEGATIVE 04/27/2020 0100   BILIRUBINUR NEGATIVE 04/27/2020 0100   KETONESUR NEGATIVE 04/27/2020 0100   PROTEINUR >300 (A) 04/27/2020 0100   UROBILINOGEN 0.2 06/10/2014 1259   NITRITE NEGATIVE 04/27/2020 0100   LEUKOCYTESUR NEGATIVE 04/27/2020 0100    Radiological Exams on Admission: DG Chest Port 1 View  Result Date: 10/13/2021 CLINICAL DATA:  Chest pain and  recent pneumonia EXAM: PORTABLE CHEST 1 VIEW COMPARISON:  09/29/2021 FINDINGS: The heart size and mediastinal contours are within normal limits. Both lungs are clear. The visualized skeletal structures are unremarkable. Remote median sternotomy IMPRESSION: No active disease. Electronically Signed   By: Ulyses Jarred M.D.   On: 10/13/2021 00:57    Assessment and Plan  NSTEMI History of CAD status post CABG -Chest pain resolved after nitroglycerin.   -Troponin 51> 1478 -Initial EKG showing new ST depressions in lateral leads which improved on subsequent EKGs.  -Cardiology consulted and planning on [facility] later in the day -Continue aspirin, metoprolol, Lipitor, Imdur -Continue IV heparin -Trend troponin  Hyperkalemia ESRD on HD TTS -Nephrology consulted and placed orders for dialysis  Acute hypoxemic respiratory failure -Currently stable on 4 L O2 -Continue supplemental oxygen, wean as tolerated  Mild leukocytosis -Likely reactive, no infectious signs or symptoms -Repeat CBC  Anemia of chronic disease Hemoglobin stable. 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 Consults called: Cardiology, nephrology Level of care: Progressive Care Unit Admission status: It is my clinical opinion that admission to INPATIENT is reasonable and necessary because of the expectation that this patient will require hospital care that crosses at least 2 midnights to treat this condition based on the medical complexity of the problems presented.  Given the aforementioned information, the predictability of an adverse outcome is felt to  be significant.   Shela Leff MD [facility]  If 7PM-7AM, please contact night-coverage [email or web address]  10/13/2021, 5:18 AM

## 2021-10-13 NOTE — Consult Note (Signed)
Cardiology Consultation   Patient ID: Railyn House MRN: [id number]; DOB: [birth year]  Admit date: 10/13/2021 Date of Consult: 10/13/2021  PCP:  Heywood Bene, PA-C   [place] Providers Cardiologist:  None        Patient Profile:   Dana Jackson is a 76 y.o. 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 Marlowe Sax  History of Present Illness:   Dana Jackson is a 76 y.o. 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 Marlowe Sax. She was in her usual state of health till 9 pm this everning when started having CP suddenly. It was diffuse. Accompanied by SOB. Never had such pains before. Hence went to the ED, where troponin 51 then 1500. She required 3 liters of oxygen. EKG showed ST depressions. Patient started on IV heparin and transferred from [place] ED to [facility] ED. Nephrology (Dr. Augustin Coupe) consulted and placed orders for urgent dialysis.  Cardiology was consulted for NSTEMI management. The chest pain has now resolved and she feels much better. K is 6. VS are stable. Of note,   Last cardiac catheterization here was in June 2016 showing occluded graft to the PDA with severe ostial PDA disease, but only moderate mid RCA disease and therefore stable flow to the posterolateral system.  Patent LIMA to diffusely diseased LAD and patent vein graft to OM1 with moderate disease in the AV groove circumflex lesion OM 2.   Based  on previous discussions, we have decided to avoid further ischemic evaluation unless she has symptoms.  The cath in 2016 was in response to an abnormal Cardiac Stress PET.     Past Medical History:  Diagnosis Date   Anemia    Arthritis    hands   Asthma    childhood   Bacteremia due to Escherichia coli 06/2013   Currently being treated with anti-biotics   CAD in native artery 12/2010   a) Cath for exertional angina & EKG changes: 40% LM, 80% mid LAD.  95% ost cX, 80-90% PDA --> CABG X3; b) Post CABG CATH for + Myoview with basal anterior ischemia -> Ost LAD 80% (diffuse) then 100% after SP2, RI 70% (too small for PCI), Ost-prox Cx 99% & OM1 90%, OstrPDA 80% (small); Occluded SVG-rPDA.  Patent LIMA-dLAD, SVG-OM: culprit ~ p-m LAD pre-LIMA & RI - not good PCI target --> Med Rx   CKD (chronic kidney disease) stage 3, GFR 30-59 ml/min (HCC)    Degenerative disc disease, cervical    Degenerative disc disease, cervical [722.4]   Diabetes mellitus without complication (HCC)    Type II   Dyspnea    Endometrial cancer ([place]) 11/2013   Treated with TAH with pelvic lymphadenectomy followed by radiation and chemotherapy   GERD (gastroesophageal reflux disease)    Hearing loss    bilateral - no hearing aids   Heart murmur    History of asthma     childhood   History  of blood transfusion    History of pneumonia    "2-3 times"   History of unstable angina November/December 2012   T wave inversions in inferolateral leads.  No stress test performed.   Hyperlipidemia LDL goal <70    Hypertension, essential, benign    Neuropathy    hands   Pneumonia 2018   Renal cell carcinoma ([place]) 11/18/2008   T2aNx s/p partial left nephrectomy   S/P CABG x 3 12/2010   LIMA-LAD, SVG RPL, SVG-Circumflex   Stroke ([place])    TIA- no residual    TIA (transient ischemic attack) 1992 &  2010    Past Surgical History:  Procedure Laterality Date   ANTERIOR CERVICAL DECOMP/DISCECTOMY FUSION  10/11/2005    multi-level   AV FISTULA PLACEMENT Left 08/03/2017   Procedure: ARTERIOVENOUS (AV) FISTULA CREATION LEFT ARM;  Surgeon: Rosetta Posner, MD;  Location: [place];  Service: Vascular;  Laterality: Left;   BRONCHIAL BIOPSY  12/30/2020   Procedure: BRONCHIAL BIOPSIES;  Surgeon: Garner Nash, DO;  Location: [place] [facility];  Service: Pulmonary;;   BRONCHIAL BIOPSY  02/03/2021   Procedure: BRONCHIAL BIOPSIES;  Surgeon: Garner Nash, DO;  Location: [place] [facility];  Service: Pulmonary;;   BRONCHIAL BRUSHINGS  12/30/2020   Procedure: BRONCHIAL BRUSHINGS;  Surgeon: Garner Nash, DO;  Location: [place] [facility];  Service: Pulmonary;;   BRONCHIAL BRUSHINGS  02/03/2021   Procedure: BRONCHIAL BRUSHINGS;  Surgeon: Garner Nash, DO;  Location: [place] [facility];  Service: Pulmonary;;   BRONCHIAL NEEDLE ASPIRATION BIOPSY  12/30/2020   Procedure: BRONCHIAL NEEDLE ASPIRATION BIOPSIES;  Surgeon: Garner Nash, DO;  Location: [place];  Service: Pulmonary;;   BRONCHIAL NEEDLE ASPIRATION BIOPSY  02/03/2021   Procedure: BRONCHIAL NEEDLE ASPIRATION BIOPSIES;  Surgeon: Garner Nash, DO;  Location: [place] [facility];  Service: Pulmonary;;   CARDIAC CATHETERIZATION  12/24/10   40% left main, 80% mid LAD, 95% ostial circumflex, 80-90% PDA.   CARDIAC CATHETERIZATION N/A 07/01/2014   Procedure: Left Heart Cath and Cors/Grafts Angiography;  Surgeon: Leonie Man, MD;  [facility]: [facility] CV LAB:  For Abn Nuc @ [facility]: Ost LAD 80%, mid LAD 100% after S/P 2, 70% RI (no PCI target), ost-prox Cx 99%, OM1 90%. Ost rPDA 80% (~ pre-CABG), occluded SVG-rPDA, patent LIMA-dLAD, SVG OM; potential culprit for abn Nuc scan = pLAD Dz, small RI 70% or PDA -> med Rx   CAROTID ENDARTERECTOMY Left    CHOLECYSTECTOMY  ~ [place] GRAFT  12/25/2010   Procedure: CORONARY ARTERY BYPASS GRAFTING (CABGX3 - LIMA-LAD, SVG RPL, SVG-Circumflex);  Surgeon: Rexene Alberts, MD;  Location: [place];  Service: Open Heart  Surgery;  Laterality: N/A;   DILATATION & CURETTAGE/HYSTEROSCOPY WITH MYOSURE N/A 11/02/2013   Procedure: DILATATION & CURETTAGE/HYSTEROSCOPY WITH MYOSURE ABLATION;  Surgeon: Allena Katz, MD;  Location: [facility];  Service: Gynecology;  Laterality: N/A;   DOPPLER ECHOCARDIOGRAPHY  12/08/2010   EF =>55%,MILD CONCENTRIC LEFT VENTRICULAR HYPERTROPHY   EYE SURGERY Bilateral    Cataract   IR DIALY SHUNT INTRO NEEDLE/INTRACATH INITIAL W/IMG LEFT Left 04/24/2019   IR FLUORO GUIDE CV LINE LEFT  04/26/2019   IR US GUIDE VASC ACCESS LEFT  04/24/2019   IR US GUIDE VASC ACCESS LEFT  04/26/2019   LEFT HEART CATHETERIZATION WITH CORONARY ANGIOGRAM N/A 12/24/2010   Procedure: LEFT HEART CATHETERIZATION WITH CORONARY ANGIOGRAM;  Surgeon: Leonie Man, MD;  Location: [facility] CATH LAB;  Service: Cardiovascular;  Laterality: N/A;   NM MYOCAR SINGLE W/SPECT  07/26/2007   EF 79%, LEFT VENT.FUNCTION NORMAL   PARTIAL NEPHRECTOMY Left 11/18/2008   left partial nephrectomy for renal cell CA   PET Myocardial Perfusion Scan  06/13/2014   At [place]: Moderate size, mild severity completely reversible defect involving the basal anterior, mid anterior and apical anterior segments consistent with ischemia. EF 65% with normal global function.   TONSILLECTOMY     "in college"   TOTAL ABDOMINAL HYSTERECTOMY  November 2015    At [facility]: Robotic procedure with pelvic lymphadenectomy   TRANSTHORACIC ECHOCARDIOGRAM  06/13/2014   At [place]: EF 60-65%. Grade 1 diastolic dysfunction. Mild MR. Aortic sclerosis. Moderate pulmonary hypertension   TUBAL LIGATION  ~ 1984   UPPER GI ENDOSCOPY     VIDEO BRONCHOSCOPY WITH RADIAL ENDOBRONCHIAL ULTRASOUND  12/30/2020   Procedure: RADIAL ENDOBRONCHIAL ULTRASOUND;  Surgeon: Garner Nash, DO;  Location: [place];  Service: Pulmonary;;   VIDEO BRONCHOSCOPY WITH RADIAL ENDOBRONCHIAL ULTRASOUND  02/03/2021   Procedure: RADIAL ENDOBRONCHIAL ULTRASOUND;  Surgeon:  Garner Nash, DO;  Location: [facility];  Service: Pulmonary;;   WISDOM TOOTH EXTRACTION         Inpatient Medications: Scheduled Meds:  aspirin  81 mg Oral QHS   atorvastatin  80 mg Oral QHS   Chlorhexidine Gluconate Cloth  6 each Topical Q0600   isosorbide mononitrate  60 mg Oral Daily   magnesium oxide  400 mg Oral Daily   metoprolol tartrate  50 mg Oral BID   pantoprazole  40 mg Oral Daily   sevelamer carbonate  800 mg Oral TID WC   Continuous Infusions:  heparin 700 Units/hr (10/13/21 0323)   PRN Meds: nitroGLYCERIN, sevelamer carbonate  Allergies:    Allergies  Allergen Reactions   Sulfa Antibiotics Other (See Comments)    "AKI"    Erythromycin Itching   Pravastatin Other (See Comments)    Unknown reaction   Simvastatin Other (See Comments)    Reaction not known   Codeine Nausea Only and Other (See Comments)    Tolerate Hycodan   Gemfibrozil Itching    Social History:   Social History   Socioeconomic History   Marital status: Married    Spouse name: [name]   Number of children: 2   Years of education: many   Highest education level: Not on file  Occupational History   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  -- Currently being very busy taking care of 2 foster children that are staying with her daughter.  A 24-year-old and a 57-year-old that they're trying to adopt.  She is very excited about the possibility of becoming a Grandmother.   She does  walk and getting exercise, but she is wanting to get back into more activities just because she has really been limited due to  her arthritic pains. She used to do things like walking and biking, and she may try to do some biking again, or at least stationary biking.    Does not smoke, does not drink.   Social Determinants of Health   Financial Resource Strain: Not on file  Food Insecurity: Not on file  Transportation Needs: Not on file  Physical Activity: Not on file  Stress: Not on file  Social Connections: Not on file  Intimate Partner Violence: Not on file    Family History:   Family History  Problem Relation Age of Onset   AAA (abdominal aortic aneurysm) Mother    Multiple myeloma Mother    GER disease Mother    Coronary artery disease Father    Heart disease Father    Coronary artery disease Sister    Coronary artery disease Brother    Heart disease Brother    Stroke Maternal Grandmother    Heart attack Neg Hx      ROS:  Please see the history of present illness.  All other ROS reviewed and negative.     Physical Exam/Data:   Vitals:   10/13/21 0245 10/13/21 0330 10/13/21 0500 10/13/21 0545  BP: 129/72 116/71 138/65 134/73  Pulse: 84 83 85 84  Resp: 13 13 19 12   Temp:   98 F (36.7 C)   TempSrc:   Oral   SpO2: 97% 95% 90% 94%  Weight:      Height:       No intake or output data in the 24 hours ending 10/13/21 0613    10/13/2021   12:22 AM 03/04/2021   11:59 AM 02/18/2021   12:00 PM  Last 3 Weights  Weight (lbs) 140 lb 142 lb 142 lb 3.2 oz  Weight (kg) 63.504 kg 64.411 kg 64.501 kg     Body mass index is 27.34 kg/m.  General:  Well nourished, well developed, in mild distress HEENT: normal Neck: no JVD Vascular: No carotid bruits; Distal pulses 2+ bilaterally Cardiac:  normal S1, S2; RRR; no murmur  Lungs: Poor airflow Abd: soft, nontender, no hepatomegaly  Ext: no edema Musculoskeletal:  No deformities, BUE and BLE strength normal and equal Skin:  warm and dry  Neuro:  CNs 2-12 intact, no focal abnormalities noted Psych:  Normal affect   EKG:  The EKG was personally reviewed and demonstrates:  ST depression in lateral leads  Laboratory Data:  High Sensitivity Troponin:   Recent Labs  Lab 10/13/21 0026 10/13/21 0234  TROPONINIHS 51* 1,478*     Chemistry Recent Labs  Lab 10/13/21 0026  NA 136  K 6.0*  CL 98  CO2 27  GLUCOSE 188*  BUN 57*  CREATININE 4.71*  CALCIUM 8.7*  GFRNONAA 9*  ANIONGAP 11    No results for input(s): "PROT", "ALBUMIN", "AST", "ALT", "ALKPHOS", "BILITOT" in the last 168 hours. Lipids No results for input(s): "CHOL", "TRIG", "HDL", "LABVLDL", "LDLCALC", "CHOLHDL" in the last 168 hours.  Hematology Recent Labs  Lab 10/13/21 0026  WBC 13.9*  RBC 3.10*  HGB 11.5*  HCT 33.5*  MCV 108.1*  MCH 37.1*  MCHC 34.3  RDW 15.8*  PLT 188   Thyroid No results for input(s): "TSH", "FREET4" in the last 168 hours.  BNP Recent Labs  Lab 10/13/21 0027  BNP 898.8*    DDimer No results for input(s): "DDIMER" in the last 168 hours.   Radiology/Studies:  DG Chest Port 1 View  Result Date: 10/13/2021 CLINICAL DATA:  Chest  pain and recent pneumonia EXAM: PORTABLE CHEST 1 VIEW COMPARISON:  09/29/2021 FINDINGS: The heart size and mediastinal contours are within normal limits. Both lungs are clear. The visualized skeletal structures are unremarkable. Remote median sternotomy IMPRESSION: No active disease. Electronically Signed   By: Ulyses Jarred M.D.   On: 10/13/2021 00:57     Assessment and Plan:   # CAD s/p CABG # NSTEMI # ESRD # HLD  -Patient presenting with classical chest pain and SOB with EKG depressions and troponin >1400 -Of note,   Last cardiac catheterization here was in June 2016 showing occluded graft to the PDA with severe ostial PDA disease, but only moderate mid RCA disease and therefore stable flow to the posterolateral system.  Patent LIMA to diffusely diseased LAD and patent vein  graft to OM1 with moderate disease in the AV groove circumflex lesion OM 2.   Based on previous discussions, we have decided to avoid further ischemic evaluation unless she has symptoms.  The cath in 2016 was in response to an abnormal Cardiac Stress PET.   -Start IV heparin drip -Load with aspirin 325 followed by 81 mg daily -Statin 80 mg -Echo in AM -Beta blockers -As K is 6 will prefer for her to get dialysis first. Then plan LHC this afternoon -Telemetry -NPO  For questions or updates, please contact [place] Please consult [email or web address] for contact info under    Signed, Jaci Lazier, MD  10/13/2021 6:13 AM

## 2021-10-13 NOTE — Interval H&P Note (Signed)
Cath Lab Visit (complete for each Cath Lab visit)  Clinical Evaluation Leading to the Procedure:   ACS: Yes.    Non-ACS:    Anginal Classification: CCS IV  Anti-ischemic medical therapy: Minimal Therapy (1 class of medications)  Non-Invasive Test Results: No non-invasive testing performed  Prior CABG: Previous CABG      History and Physical Interval Note:  10/13/2021 2:19 PM  Dana Jackson  has presented today for surgery, with the diagnosis of nstemi.  The various methods of treatment have been discussed with the patient and family. After consideration of risks, benefits and other options for treatment, the patient has consented to  Procedure(s): LEFT HEART CATH AND CORS/GRAFTS ANGIOGRAPHY (N/A) as a surgical intervention.  The patient's history has been reviewed, patient examined, no change in status, stable for surgery.  I have reviewed the patient's chart and labs.  Questions were answered to the patient's satisfaction.     Belva Crome III

## 2021-10-13 NOTE — Progress Notes (Signed)
PROGRESS NOTE                                                                                                                                                                                                             Patient Demographics:    Dana Jackson, is a 76 y.o. female, DOB - [birth year], TZG:[id number]  Outpatient Primary MD for the patient is Heywood Bene, PA-C    LOS - 0  Admit date - 10/13/2021    Chief Complaint  Patient presents with   Chest Pain       Brief Narrative (HPI from H&P)    76 y.o. 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  SPO2 86-88% on room air, improved with 3 L O2.  Afebrile.  Labs showing WBC 13.9, hemoglobin 11.5 (stable), potassium 6.0, glucose 188, troponin 51> 1478, BNP 898, COVID and influenza PCR negative.  Chest x-ray showing no active disease.  Initial EKG showing new ST depressions in lateral leads which improved on subsequent EKGs.  Was diagnosed with NSTEMI and transferred [facility] [place] [facility], she was seen by cardiology started on heparin drip, nephrology was consulted for dialysis and she was admitted to the hospital.   Subjective:    Mosetta Pigeon today has, No headache, No chest pain, No abdominal pain - No Nausea, No new weakness tingling or numbness, no SOB.   Assessment  & Plan :   NSTEMI, patient with history of CAD s/p CABG around 10 years ago follows with Dr. Ellyn Hack cardiologist. She presented with chest pain and troponin trend is suggestive of NSTEMI, EKG has ST depressions, she has been seen by the cardiology team, she has been  started on heparin drip, aspirin beta-blocker Lipitor along with Imdur.  She is currently chest pain-free.  Cardiology contemplating left heart cath.  Management of this issue defer to cardiology team.   Hyperkalemia, in a patient with ESRD. Nephrology consulted have discussed the case with the nephrology team, HD soon today.   Acute hypoxemic respiratory failure - Currently stable on 4 L  O2, Continue supplemental oxygen, is due to #1 and 2 above, fluid removal via HD, NSTEMI treatment per cardiology.   Mild leukocytosis  - Likely reactive, no infectious signs or symptoms, monitor closely clinically.   Anemia of chronic disease  - Hemoglobin stable.   Hypertension  -Continue metoprolol and Imdur.   Hyperlipidemia -Continue Lipitor   History of stroke/TIA  -Continue aspirin and Lipitor   Carcinoid tumor of both lungs status post SBRT  -Outpatient follow-up  DM 2 - -Very sensitive sliding scale insulin.  Lab Results  Component Value Date   HGBA1C 5.6 04/27/2019    CBG (last 3)  Recent Labs    10/13/21 0620  GLUCAP 112*         Condition - Extremely Guarded  Family Communication  : Husband bedside on 10/13/2021  Code Status : Full code  Consults  : Nephrology, cardiology  PUD Prophylaxis : PPI   Procedures  :            Disposition Plan  :    Status is: Inpatient   DVT Prophylaxis  :  Hep gtt   Lab Results  Component Value Date   PLT 188 10/13/2021    Diet :  Diet Order             Diet NPO time specified  Diet effective now                    Inpatient Medications  Scheduled Meds:  aspirin  81 mg Oral QHS   atorvastatin  80 mg Oral QHS   Chlorhexidine Gluconate Cloth  6 each Topical Q0600   insulin aspart  0-6 Units Subcutaneous Q4H   isosorbide mononitrate  60 mg Oral Daily   magnesium oxide  400 mg Oral Daily   metoprolol tartrate  50 mg Oral BID   pantoprazole  40 mg Oral Daily   sevelamer carbonate  800 mg Oral TID WC    Continuous Infusions:  heparin 700 Units/hr (10/13/21 0323)   PRN Meds:.nitroGLYCERIN, sevelamer carbonate  Antibiotics  :    Anti-infectives (From admission, onward)    None        Time Spent in minutes  30   Lala Lund M.D on 10/13/2021 at 8:01 AM  To page go to [email or web address]   [facility] -  Office  [phone]  See all Orders from today for further details    Objective:   Vitals:   10/13/21 0500 10/13/21 0545 10/13/21 0615 10/13/21 0715  BP: 138/65 134/73 (!) 151/77 (!) 147/80  Pulse: 85 84 80 85  Resp: '19 12 19 16  '$ Temp: 98 F (36.7 C)     TempSrc: Oral     SpO2: 90% 94% 92% 94%  Weight:      Height:        Wt Readings from Last 3 Encounters:  10/13/21 63.5 kg  03/04/21 64.4 kg  02/18/21 64.5 kg    No intake or output data in the 24 hours ending 10/13/21 0801   Physical Exam  Awake Alert, No new F.N deficits, Normal affect [place].AT,PERRAL Supple Neck, No JVD,   Symmetrical Chest wall movement, Good air movement bilaterally, CTAB RRR,No Gallops,Rubs or new Murmurs,  +ve B.Sounds, Abd Soft, No tenderness,   No Cyanosis, Clubbing or edema       Data Review:    CBC Recent Labs  Lab 10/13/21 0026  WBC 13.9*  HGB 11.5*  HCT 33.5*  PLT 188  MCV 108.1*  MCH 37.1*  MCHC 34.3  RDW 15.8*    Electrolytes Recent Labs  Lab 10/13/21 0026 10/13/21 0027 10/13/21 0500  NA 136  --   --   K 6.0*  --   --   CL 98  --   --   CO2 27  --   --   GLUCOSE 188*  --   --   BUN 57*  --   --   CREATININE 4.71*  --   --   CALCIUM 8.7*  --   --   MG  --   --  2.8*  BNP  --  898.8*  --      Radiology Reports DG Chest Port 1 View  Result Date: 10/13/2021 CLINICAL DATA:  Chest pain and recent pneumonia EXAM: PORTABLE CHEST 1 VIEW COMPARISON:  09/29/2021 FINDINGS: The heart size and mediastinal contours are within normal limits. Both lungs are clear. The visualized skeletal structures are unremarkable. Remote median sternotomy  IMPRESSION: No active disease. Electronically Signed   By: Ulyses Jarred M.D.   On: 10/13/2021 00:57

## 2021-10-13 NOTE — ED Notes (Signed)
Taken to dialysis by transporter.

## 2021-10-13 NOTE — Progress Notes (Signed)
Awaiting result for hep B panel and Hep C which was drawn at 5 a.m. today.

## 2021-10-13 NOTE — ED Notes (Signed)
Patient placed on 3LNC at this time d/t SpO2 86-88% on Room Air with increased WOB. SpO2 92%

## 2021-10-13 NOTE — Plan of Care (Signed)
TR[facility] transfer call note:  [facility] to [facility] (dialysis patient): Pt with h/o ESRD on TTS HD, carcinoid tumor of both lungs s/p SBRT, in to ED with CP, SOB, new 3L O2 requirement.  covid at doc office neg.   Productive cough for past week, on ABx for past 5 days.   CP resolved with NTG   CXR negative.   K 6.0  First trop 51   EKG = new lateral ST depressions.   EDP spoke with cards: admit to med, heparinize if second trop elevates further.   K 6.0 so I will leave temp measures to EDP.  If no bed shortly recd ED to ED transfer so she doesn't miss dialysis.   Call nephro on arrival / in AM / make sure she gets dialysis please.   Update 0352AM: second trop 1500, heparin gtt started, bed request upgraded to progressive, coming ED to ED now to [facility].  TR[facility] will assume care on arrival to accepting facility. Until arrival, care as per EDP. However, [facility] available 24/7 for questions and assistance.  Nursing staff, please page [place] and Consults [phone]) as soon as the patient arrives to the hospital.

## 2021-10-13 NOTE — ED Notes (Signed)
Date and time results received: 10/13/21 0310 (use smartphrase ".now" to insert current time)  Test: troponin Critical Value: 1478  Name of Provider Notified: C. Karle Starch, MD  Orders Received? Or Actions Taken?:  n/a

## 2021-10-13 NOTE — ED Provider Notes (Signed)
[facility]  Provider Note  CSN: [id number] Arrival date & time: 10/13/21 0015  History Chief Complaint  Patient presents with   Chest Pain    Dana Jackson is a 76 y.o. 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 She reports she has had a cough and SOB for the last week or so, productive of thick green sputum. She was put on Abx last week for 5 days but reports cough has been continuing and she feels SOB with walking around her house. She has never needed oxygen at home. Earlier in the day today she began having moderate to severe midsternal chest pain she describes as a tightness. Non-radiating, was 8/10, currently 5/10. She has not had any leg swelling. No formal diagnosis of CHF, most recent echo in 2021 showed EF 65-70%   Home Medications Prior to Admission medications   Medication Sig Start Date End Date Taking? Authorizing Provider  acetaminophen (TYLENOL) 500 MG tablet Take 1,000 mg by mouth every 6 (six) hours as needed for moderate pain.    [provider]  Alpha-Lipoic Acid 600 MG CAPS Take 600 mg by mouth daily.    [provider]  aspirin 81 MG tablet Take 81 mg by mouth at bedtime.    [provider]  atorvastatin (LIPITOR) 80 MG tablet Take 1 tablet (80 mg total) by mouth at bedtime. 04/29/19 01/28/21  [place] Donnamarie Poag, DO  benzonatate (TESSALON) 100 MG capsule Take 100-200 mg by mouth 3 (three) times daily as needed for cough.    [provider]  calcitRIOL (ROCALTROL) 0.25 MCG capsule Take by mouth.    [provider]  cinacalcet (SENSIPAR) 30 MG tablet TAKE 1 TABLET BY MOUTH THREE  DAYS (TIMES) A WEEK  AFTER DIALYSIS 04/18/20   [provider]  cyanocobalamin (,VITAMIN B-12,) 1000 MCG/ML injection Inject 1 mL (1,000 mcg total) into the muscle every 30 (thirty) days for 90  doses. 06/27/19 10/19/26  [place] Donnamarie Poag, DO  Darbepoetin Alfa (ARANESP) 60 MCG/0.3ML SOSY injection Inject 0.3 mLs (60 mcg total) into the vein every Tuesday with hemodialysis. 05/01/19   [place] Donnamarie Poag, DO  estradiol (ESTRACE) 0.1 MG/GM vaginal cream Place 1 Applicatorful vaginally 3 (three) times a week.    [provider]  famotidine (PEPCID) 20 MG tablet Take 1 tablet (20 mg total) by mouth at bedtime. 04/29/19 01/28/21  [place] Eric J, DO  fluticasone (FLONASE) 50 MCG/ACT nasal spray Place 1 spray into both nostrils daily as needed for allergies or rhinitis.    [provider]  furosemide (LASIX) 20 MG tablet Take 20 mg by mouth 2 (two) times a week. Tues and Saturday night    [provider]  gabapentin (NEURONTIN) 100 MG capsule Take 100 mg by mouth 3 (three) times daily.     [provider]  iron sucrose in sodium chloride 0.9 % 100 mL Iron Sucrose (Venofer) 02/05/21 02/04/22  [provider]  isosorbide mononitrate (IMDUR) 60 MG 24 hr tablet TAKE 1 TABLET BY MOUTH EVERY DAY 02/02/21   Leonie Man, MD  lidocaine-prilocaine (EMLA) cream Apply 1 application topically Every Tuesday,Thursday,and Saturday with dialysis. 12/20/19   [provider]  loperamide (IMODIUM A-D) 2 MG tablet Take 4 mg by mouth as needed for diarrhea or loose stools. 03/18/20   [provider]  loratadine (CLARITIN) 10 MG tablet Take 10 mg by mouth daily.    [provider]  loratadine (CLARITIN) 10 MG tablet Take 1 tablet by mouth daily.    [provider]  magnesium oxide (MAG-OX) 400 MG tablet Take 400 mg by mouth daily.    [provider]  Methoxy PEG-Epoetin Beta (MIRCERA IJ) Mircera 02/26/21 02/25/22  [provider]  metoprolol tartrate (LOPRESSOR) 50 MG tablet Take 1 tablet (50 mg total) by mouth 2 (two) times daily. schedule an appointment for further refills, 1st attempt 09/28/21   Leonie Man, MD  nitroGLYCERIN  (NITROSTAT) 0.4 MG SL tablet Place 1 tablet (0.4 mg total) under the tongue every 5 (five) minutes as needed for chest pain. 12/26/19   Leonie Man, MD  nitroGLYCERIN (NITROSTAT) 0.4 MG SL tablet Place 1 tablet under the tongue every 5 (five) minutes as needed.    [provider]  Omega-3 Fatty Acids (FISH OIL) 1200 MG CAPS Take 2,400 mg by mouth in the morning and at bedtime.    [provider]  omeprazole (PRILOSEC) 40 MG capsule Take 40 mg by mouth daily before breakfast.    [provider]  Probiotic Product (ALIGN) 4 MG CAPS Take 4 mg by mouth daily.    [provider]  promethazine (PHENERGAN) 12.5 MG tablet Take 12.5 mg by mouth every 6 (six) hours as needed for nausea or vomiting.    [provider]  pyridOXINE (VITAMIN B-6) 100 MG tablet Take 200 mg by mouth daily.     [provider]  sevelamer carbonate (RENVELA) 800 MG tablet Take 800 mg by mouth See admin instructions. Take 800 mg with each meal and each snack 09/16/19   [provider]  sodium chloride (OCEAN) 0.65 % SOLN nasal spray Place 1 spray into both nostrils as needed for congestion.    [provider]     Allergies    Sulfa antibiotics, Erythromycin, Pravastatin, Simvastatin, Codeine, and Gemfibrozil   Review of Systems   Review of Systems Please see HPI for pertinent positives and negatives  Physical Exam BP 116/71   Pulse 83   Temp 98 F (36.7 C) (Oral)   Resp 13   Ht 5' (1.524 m)   Wt 63.5 kg   SpO2 95%   BMI 27.34 kg/m   Physical Exam Vitals and nursing note reviewed.  Constitutional:      Appearance: Normal appearance.  HENT:     Head: Normocephalic and atraumatic.     Nose: Nose normal.     Mouth/Throat:     Mouth: Mucous membranes are moist.  Eyes:     Extraocular Movements: Extraocular movements intact.     Conjunctiva/sclera: Conjunctivae normal.  Cardiovascular:     Rate and Rhythm: Normal rate.  Pulmonary:      Effort: Pulmonary effort is normal.     Breath sounds: Wheezing and rhonchi present.  Abdominal:     General: Abdomen is flat.     Palpations: Abdomen is soft.     Tenderness: There is no abdominal tenderness.  Musculoskeletal:        General: No swelling. Normal range of motion.     Cervical back: Neck supple.     Right lower leg: No edema.     Left lower leg: No edema.  Skin:    General: Skin is warm and dry.  Neurological:     General: No focal deficit present.     Mental Status:  She is alert.  Psychiatric:        Mood and Affect: Mood normal.     ED Results / Procedures / Treatments   EKG EKG Interpretation  Date/Time:  Tuesday October 13 2021 03:10:34 EDT Ventricular Rate:  83 PR Interval:  154 QRS Duration: 91 QT Interval:  393 QTC Calculation: 462 R Axis:   70 Text Interpretation: Sinus rhythm Minimal ST depression, lateral leads Since last tracing ST depression in lateral leads has improved Confirmed by Calvert Cantor [phone]) on 10/13/2021 3:23:49 AM  Procedures .Critical Care  Performed by: Truddie Hidden, MD Authorized by: Truddie Hidden, MD   Critical care provider statement:    Critical care time (minutes):  80   Critical care time was exclusive of:  Separately billable procedures and treating other patients   Critical care was necessary to treat or prevent imminent or life-threatening deterioration of the following conditions:  Cardiac failure   Critical care was time spent personally by me on the following activities:  Development of treatment plan with patient or surrogate, discussions with consultants, evaluation of patient's response to treatment, examination of patient, ordering and review of laboratory studies, ordering and review of radiographic studies, ordering and performing treatments and interventions, pulse oximetry, re-evaluation of patient's condition and review of old charts   Care discussed with: admitting provider     Medications  Ordered in the ED Medications  nitroGLYCERIN (NITROSTAT) SL tablet 0.4 mg (0.4 mg Sublingual Given 10/13/21 0059)  heparin ADULT infusion 100 units/mL (25000 units/247m) (700 Units/hr Intravenous New Bag/Given 10/13/21 0323)  aspirin chewable tablet 324 mg (324 mg Oral Given 10/13/21 0058)  heparin bolus via infusion 2,000 Units (2,000 Units Intravenous Bolus from Bag 10/13/21 0323)    Initial Impression and Plan  Patient with complex past medical history here with recent respiratory symptoms and now chest pains. Initial EKG with some potential ischemic changes laterally. Will check labs, CXR give ASA and NTG. She was borderline hypoxic at rest, start [place] oxygen.   ED Course   Clinical Course as of 10/13/21 0428  Tue Oct 13, 2021  0105 CBC with leukocytosis. Anemia is at baseline. I personally viewed the images from radiology studies and agree with radiologist interpretation: CXR is clear.   [CS]  0125 BMP with ESRD, moderate hyperkalemia, due for dialysis in the AM. Trop is elevated, above prior baseline. BNP is elevated of unclear significance in dialysis patient.  [CS]  02130Sp[facility], trend Trop and heparin if significant increase on repeat. Her pain is now a 0/10 after NTG.  [CS]  [id number] with Dr. GAlcario Drought Hospitalist, who will accept for admission. If not bed available during the night may need ED-to-ED transfer for dialysis in the AM.  [CS]  0312 Repeat Trop is significantly elevated. Will begin heparin drip. Cardiology paged to inform of that increase.  [CS]  0351 Given her dialysis and likely cath labs needs [facility] Dr[facility] GAlcario Droughtand Dr. KHumphrey Rollsinformed via secure chat. Patient is sleeping soundly, remains pain free. Repeat EKG is improved.  [CS]    Clinical Course User Index [CS] STruddie Hidden MD     MDM  Rules/Calculators/A&P Medical Decision Making Problems Addressed: Acute bronchitis, unspecified organism: acute illness or injury Acute respiratory failure with hypoxia ([facility]: acute illness or injury ESRD  on hemodialysis [facility]): chronic illness or injury NSTEMI (non-ST elevated myocardial infarction) [facility]): acute illness or injury  Amount and/or Complexity of Data Reviewed Labs: ordered. Decision-making details documented in ED Course. Radiology: ordered and independent interpretation performed. Decision-making details documented in ED Course. ECG/medicine tests: ordered and independent interpretation performed. Decision-making details documented in ED Course.  Risk OTC drugs. Prescription drug management. Decision regarding hospitalization.    Final Clinical Impression(s) / ED Diagnoses Final diagnoses:  NSTEMI (non-ST elevated myocardial infarction) ([place])  ESRD on hemodialysis ([place])  Acute bronchitis, unspecified organism  Acute respiratory failure with hypoxia [facility])    Rx / DC Orders ED Discharge Orders     None        Truddie Hidden, MD 10/13/21 [phone]

## 2021-10-13 NOTE — Consult Note (Signed)
Renal Service Consult Note [facility]  Karess Harner 10/13/2021 Sol Blazing, MD Requesting Physician: Dr. Ronnie Derby  Reason for Consult: ESRD pt w/ chest pain HPI: The patient is a 76 y.o. year-old w/ hx of anemia, DJD, ESRD on HD, CAD sp CABG (2012), HL, HTN, hx RCC (2010), hx TIA/ CVA presented to ED late last night w/ c/o cough, mild SOB and chest pains. Stated she was "getting over pna". SpO2 was 88%, improved w/ 3L [place] in ED. Afeb, wbc 13, Hb 11, K 6.0. CXR no active disease. Initial ekg + for ST depressions, pt given asa/ sl ntg. Trop was high 51 > 4178. Chest pain resolved. Pt started on IV hep and admitted. We were asked to see for dialysis.   Pt seen in HD unit. States hx of "bypass" years ago. CP was somewhat sudden onset yesterday while cleaning her house. Then felt better after sl ntg in ED.  Last HD Sat, hasn't missed any HD. Good compliance. Pt lives w/ her husband, he drives her to and from dialysis. Recently finished po abx for outpt pneumonia.   ROS - no joint pain, no HA, no blurry vision, no rash, no diarrhea, no nausea/ vomiting   Past Medical History  Past Medical History:  Diagnosis Date   Anemia    Arthritis    hands   Asthma    childhood   Bacteremia due to Escherichia coli 06/2013   Currently being treated with anti-biotics   CAD in native artery 12/2010   a) Cath for exertional angina & EKG changes: 40% LM, 80% mid LAD.  95% ost cX, 80-90% PDA --> CABG X3; b) Post CABG CATH for + Myoview with basal anterior ischemia -> Ost LAD 80% (diffuse) then 100% after SP2, RI 70% (too small for PCI), Ost-prox Cx 99% & OM1 90%, OstrPDA 80% (small); Occluded SVG-rPDA.  Patent LIMA-dLAD, SVG-OM: culprit ~ p-m LAD pre-LIMA & RI - not good PCI target --> Med Rx   CKD (chronic kidney disease) stage 3, GFR 30-59 ml/min (HCC)    Degenerative disc disease, cervical    Degenerative disc disease, cervical [722.4]   Diabetes mellitus without complication (HCC)     Type II   Dyspnea    Endometrial cancer ([place]) 11/2013   Treated with TAH with pelvic lymphadenectomy followed by radiation and chemotherapy   GERD (gastroesophageal reflux disease)    Hearing loss    bilateral - no hearing aids   Heart murmur    History of asthma     childhood   History of blood transfusion    History of pneumonia    "2-3 times"   History of unstable angina November/December 2012   T wave inversions in inferolateral leads.  No stress test performed.   Hyperlipidemia LDL goal <70    Hypertension, essential, benign    Neuropathy    hands   Pneumonia 2018   Renal cell carcinoma ([place]) 11/18/2008   T2aNx s/p partial left nephrectomy   S/P CABG x 3 12/2010   LIMA-LAD, SVG RPL, SVG-Circumflex   Stroke ([place])    TIA- no residual    TIA (transient ischemic attack) 1992 &  2010   Past Surgical History  Past Surgical History:  Procedure Laterality Date   ANTERIOR CERVICAL DECOMP/DISCECTOMY FUSION  10/11/2005   multi-level   AV FISTULA PLACEMENT Left 08/03/2017   Procedure: ARTERIOVENOUS (AV) FISTULA CREATION LEFT ARM;  Surgeon: Rosetta Posner, MD;  Location: [place];  Service: Vascular;  Laterality: Left;   BRONCHIAL BIOPSY  12/30/2020   Procedure: BRONCHIAL BIOPSIES;  Surgeon: Garner Nash, DO;  Location: [place];  Service: Pulmonary;;   BRONCHIAL BIOPSY  02/03/2021   Procedure: BRONCHIAL BIOPSIES;  Surgeon: Garner Nash, DO;  Location: [place];  Service: Pulmonary;;   BRONCHIAL BRUSHINGS  12/30/2020   Procedure: BRONCHIAL BRUSHINGS;  Surgeon: Garner Nash, DO;  Location: [place] [facility];  Service: Pulmonary;;   BRONCHIAL BRUSHINGS  02/03/2021   Procedure: BRONCHIAL BRUSHINGS;  Surgeon: Garner Nash, DO;  Location: [place];  Service: Pulmonary;;   BRONCHIAL NEEDLE ASPIRATION BIOPSY  12/30/2020   Procedure: BRONCHIAL NEEDLE ASPIRATION BIOPSIES;  Surgeon: Garner Nash, DO;  Location: [place];  Service: Pulmonary;;   BRONCHIAL NEEDLE  ASPIRATION BIOPSY  02/03/2021   Procedure: BRONCHIAL NEEDLE ASPIRATION BIOPSIES;  Surgeon: Garner Nash, DO;  Location: [place];  Service: Pulmonary;;   CARDIAC CATHETERIZATION  12/24/10   40% left main, 80% mid LAD, 95% ostial circumflex, 80-90% PDA.   CARDIAC CATHETERIZATION N/A 07/01/2014   Procedure: Left Heart Cath and Cors/Grafts Angiography;  Surgeon: Leonie Man, MD;  Location: [facility] INVASIVE CV LAB:  For Abn Nuc @ [facility]: Ost LAD 80%, mid LAD 100% after S/P 2, 70% RI (no PCI target), ost-prox Cx 99%, OM1 90%. Ost rPDA 80% (~ pre-CABG), occluded SVG-rPDA, patent LIMA-dLAD, SVG OM; potential culprit for abn Nuc scan = pLAD Dz, small RI 70% or PDA -> med Rx   CAROTID ENDARTERECTOMY Left    CHOLECYSTECTOMY  ~ [place] GRAFT  12/25/2010   Procedure: CORONARY ARTERY BYPASS GRAFTING (CABGX3 - LIMA-LAD, SVG RPL, SVG-Circumflex);  Surgeon: Rexene Alberts, MD;  Location: [place];  Service: Open Heart Surgery;  Laterality: N/A;   DILATATION & CURETTAGE/HYSTEROSCOPY WITH MYOSURE N/A 11/02/2013   Procedure: DILATATION & CURETTAGE/HYSTEROSCOPY WITH MYOSURE ABLATION;  Surgeon: Allena Katz, MD;  Location: [facility];  Service: Gynecology;  Laterality: N/A;   DOPPLER ECHOCARDIOGRAPHY  12/08/2010   EF =>55%,MILD CONCENTRIC LEFT VENTRICULAR HYPERTROPHY   EYE SURGERY Bilateral    Cataract   IR DIALY SHUNT INTRO NEEDLE/INTRACATH INITIAL W/IMG LEFT Left 04/24/2019   IR FLUORO GUIDE CV LINE LEFT  04/26/2019   IR US GUIDE VASC ACCESS LEFT  04/24/2019   IR US GUIDE VASC ACCESS LEFT  04/26/2019   LEFT HEART CATHETERIZATION WITH CORONARY ANGIOGRAM N/A 12/24/2010   Procedure: LEFT HEART CATHETERIZATION WITH CORONARY ANGIOGRAM;  Surgeon: Leonie Man, MD;  Location: [facility] CATH LAB;  Service: Cardiovascular;  Laterality: N/A;   NM MYOCAR SINGLE W/SPECT  07/26/2007   EF 79%, LEFT VENT.FUNCTION NORMAL   PARTIAL NEPHRECTOMY Left 11/18/2008   left partial nephrectomy for renal  cell CA   PET Myocardial Perfusion Scan  06/13/2014   At [place]: Moderate size, mild severity completely reversible defect involving the basal anterior, mid anterior and apical anterior segments consistent with ischemia. EF 65% with normal global function.   TONSILLECTOMY     "in college"   TOTAL ABDOMINAL HYSTERECTOMY  November 2015    At [facility]: Robotic procedure with pelvic lymphadenectomy   TRANSTHORACIC ECHOCARDIOGRAM  06/13/2014   At [place]: EF 60-65%. Grade 1 diastolic dysfunction. Mild MR. Aortic sclerosis. Moderate pulmonary hypertension   TUBAL LIGATION  ~ 1984   UPPER GI ENDOSCOPY     VIDEO BRONCHOSCOPY WITH RADIAL ENDOBRONCHIAL ULTRASOUND  12/30/2020   Procedure: RADIAL  ENDOBRONCHIAL ULTRASOUND;  Surgeon: Garner Nash, DO;  Location: [place];  Service: Pulmonary;;   VIDEO BRONCHOSCOPY WITH RADIAL ENDOBRONCHIAL ULTRASOUND  02/03/2021   Procedure: RADIAL ENDOBRONCHIAL ULTRASOUND;  Surgeon: Garner Nash, DO;  Location: [facility];  Service: Pulmonary;;   WISDOM TOOTH EXTRACTION     Family History  Family History  Problem Relation Age of Onset   AAA (abdominal aortic aneurysm) Mother    Multiple myeloma Mother    GER disease Mother    Coronary artery disease Father    Heart disease Father    Coronary artery disease Sister    Coronary artery disease Brother    Heart disease Brother    Stroke Maternal Grandmother    Heart attack Neg Hx    Social History  reports that she has never smoked. She has never used smokeless tobacco. She reports that she does not currently use alcohol. She reports that she does not use drugs. Allergies  Allergies  Allergen Reactions   Sulfa Antibiotics Other (See Comments)    "AKI"    Erythromycin Itching   Pravastatin Other (See Comments)    Unknown reaction   Simvastatin Other (See Comments)    Reaction not known   Codeine Nausea Only and Other (See Comments)    Tolerate Hycodan   Gemfibrozil Itching    Home medications Prior to Admission medications   Medication Sig Start Date End Date Taking? Authorizing Provider  acetaminophen (TYLENOL) 500 MG tablet Take 1,000 mg by mouth every 6 (six) hours as needed for moderate pain.   Yes [provider]  Alpha-Lipoic Acid 600 MG CAPS Take 600 mg by mouth daily.   Yes [provider]  aspirin 81 MG tablet Take 81 mg by mouth at bedtime.   Yes [provider]  atorvastatin (LIPITOR) 80 MG tablet Take 1 tablet (80 mg total) by mouth at bedtime. 04/29/19 10/14/22 Yes [place] Eric J, DO  benzonatate (TESSALON) 100 MG capsule Take 100-200 mg by mouth 3 (three) times daily as needed for cough.   Yes [provider]  cinacalcet (SENSIPAR) 30 MG tablet Take 30 mg by mouth daily with breakfast. 04/18/20  Yes [provider]  estradiol (ESTRACE) 0.1 MG/GM vaginal cream Place 1 Applicatorful vaginally 3 (three) times a week.   Yes [provider]  famotidine (PEPCID) 20 MG tablet Take 1 tablet (20 mg total) by mouth at bedtime. 04/29/19 10/14/22 Yes [place] Eric J, DO  furosemide (LASIX) 20 MG tablet Take 20 mg by mouth 2 (two) times a week. Tues and Saturday night   Yes [provider]  gabapentin (NEURONTIN) 100 MG capsule Take 100 mg by mouth 2 (two) times daily.   Yes [provider]  isosorbide mononitrate (IMDUR) 60 MG 24 hr tablet TAKE 1 TABLET BY MOUTH EVERY DAY Patient taking differently: Take 60 mg by mouth at bedtime. 02/02/21  Yes Leonie Man, MD  lidocaine-prilocaine (EMLA) cream Apply 1 application topically Every Tuesday,Thursday,and Saturday with dialysis. 12/20/19  Yes [provider]  loperamide (IMODIUM A-D) 2 MG tablet Take 4 mg by mouth as needed for diarrhea or loose stools. 03/18/20  Yes [provider]  loratadine (CLARITIN) 10 MG tablet Take 10 mg by mouth daily.   Yes [provider]  magnesium oxide (MAG-OX) 400 MG tablet Take 400 mg by mouth  daily.   Yes [provider]  metoprolol tartrate (LOPRESSOR) 50 MG tablet Take 1 tablet (50 mg total) by mouth 2 (  two) times daily. schedule an appointment for further refills, 1st attempt 09/28/21  Yes Leonie Man, MD  nitroGLYCERIN (NITROSTAT) 0.4 MG SL tablet Place 1 tablet (0.4 mg total) under the tongue every 5 (five) minutes as needed for chest pain. 12/26/19  Yes Leonie Man, MD  Omega-3 Fatty Acids (FISH OIL) 1200 MG CAPS Take 2,400 mg by mouth in the morning and at bedtime.   Yes [provider]  omeprazole (PRILOSEC) 40 MG capsule Take 40 mg by mouth daily before breakfast.   Yes [provider]  Probiotic Product (ALIGN) 4 MG CAPS Take 4 mg by mouth daily.   Yes [provider]  pyridOXINE (VITAMIN B-6) 100 MG tablet Take 200 mg by mouth daily.    Yes [provider]  sevelamer carbonate (RENVELA) 800 MG tablet Take 800 mg by mouth See admin instructions. Take 800 mg with each meal and each snack 09/16/19  Yes [provider]  calcitRIOL (ROCALTROL) 0.25 MCG capsule Take by mouth.    [provider]  cyanocobalamin (,VITAMIN B-12,) 1000 MCG/ML injection Inject 1 mL (1,000 mcg total) into the muscle every 30 (thirty) days for 90 doses. Patient not taking: Reported on 10/13/2021 06/27/19 10/19/26  [place] Donnamarie Poag, DO  Darbepoetin Alfa (ARANESP) 60 MCG/0.3ML SOSY injection Inject 0.3 mLs (60 mcg total) into the vein every Tuesday with hemodialysis. 05/01/19   [place] Donnamarie Poag, DO  iron sucrose in sodium chloride 0.9 % 100 mL Iron Sucrose (Venofer) 02/05/21 02/04/22  [provider]  Methoxy PEG-Epoetin Beta (MIRCERA IJ) Mircera 02/26/21 02/25/22  [provider]  promethazine-dextromethorphan (PROMETHAZINE-DM) 6.25-15 MG/5ML syrup Take 5 mLs by mouth 4 (four) times daily as needed for cough. Patient not taking: Reported on 10/13/2021 09/28/21   [provider]     Vitals:   10/13/21 1138 10/13/21 1153  10/13/21 1250 10/13/21 1445  BP: (!) 159/63 (!) 150/66  (!) 156/63  Pulse: 70 68  67  Resp: _0 Temp:  97.8 F (36.6 C)  98 F (36.7 C)  TempSrc:  Oral  Oral  SpO2: 97% 98%  98%  Weight:   62.9 kg   Height:       Exam Gen alert, no distress, pleasant No rash, cyanosis or gangrene Sclera anicteric, throat clear  No jvd or bruits Chest bilat rales resolved w/ deep breath, no wheezing RRR no MRG Abd soft ntnd no mass or ascites +bs GU defer MS no joint effusions or deformity Ext no LE or UE edema, no wounds or ulcers Neuro is alert, Ox 3 , nf    LUA AVF+bruit   Home meds include - aspriin, lipitor, cinacalcet 30 qd, famotidine, furosemide 20 biw, gabapentin, imdur 60, metoprolol 50 bid, sl ntg, prilosec, sevelamer 1 ac tid, prns/ vits/ supps   OP HD: [facility]  4h  400 /1.5  62.7kg   2/2 bath  Hep 2000   LUA AVF - mircera 50 q2, ordered 9/19 but has not rec'd yet, not sure why - venofer 22m weekly, just started - last HD 9/30, post wt 62.8kg   Assessment/ Plan: Chest pain / NSTEMI / hx CABG - chest pain resolved now, initial EKG + for ST depressions. Getting IV heparin. Per pmd/ cardiology.  ESRD - on HD TTS. Got her HD here this am. Has not missed any op HD. Cont TTS HD while here.  Volume - CXR neg for edema, no vol excess on exam. 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  MBD ckd - Ca in range, cont renvela as binder      Kelly Splinter  MD 10/13/2021, 2:49 PM Recent Labs  Lab 10/13/21 0026  HGB 11.5*  CALCIUM 8.7*  CREATININE 4.71*  K 6.0*   Inpatient medications:  aspirin  81 mg Oral QHS   atorvastatin  80 mg Oral QHS   Chlorhexidine Gluconate Cloth  6 each Topical Q0600   insulin aspart  0-6 Units Subcutaneous Q4H   isosorbide mononitrate  60 mg Oral Daily   magnesium oxide  400 mg Oral Daily   metoprolol tartrate  50 mg Oral BID    pantoprazole  40 mg Oral Daily   sevelamer carbonate  800 mg Oral TID WC   sodium chloride flush  3 mL Intravenous Q12H    [START ON 10/14/2021] sodium chloride     heparin 700 Units/hr (10/13/21 0323)   nitroGLYCERIN, sevelamer carbonate

## 2021-10-13 NOTE — H&P (View-Only) (Signed)
Cardiology Consultation   Patient ID: Dana Jackson MRN: [id number]; DOB: October 23, 1945  Admit date: 10/13/2021 Date of Consult: 10/13/2021  PCP:  Heywood Bene, PA-C   [facility] Providers Cardiologist:  None        Patient Profile:   Dana Jackson is a 76 y.o. 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 Marlowe Sax  History of Present Illness:   Dana Jackson is a 76 y.o. 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 Marlowe Sax. She was in her usual state of health till 9 pm this everning when started having CP suddenly. It was diffuse. Accompanied by SOB. Never had such pains before. Hence went to the ED, where troponin 51 then 1500. She required 3 liters of oxygen. EKG showed ST depressions. Patient started on IV heparin and transferred from [place] ED to [facility] ED. Nephrology (Dr. Augustin Coupe) consulted and placed orders for urgent dialysis.  Cardiology was consulted for NSTEMI management. The chest pain has now resolved and she feels much better. K is 6. VS are stable. Of note,   Last cardiac catheterization here was in June 2016 showing occluded graft to the PDA with severe ostial PDA disease, but only moderate mid RCA disease and therefore stable flow to the posterolateral system.  Patent LIMA to diffusely diseased LAD and patent vein graft to OM1 with moderate disease in the AV groove circumflex lesion OM 2.   Based  on previous discussions, we have decided to avoid further ischemic evaluation unless she has symptoms.  The cath in 2016 was in response to an abnormal Cardiac Stress PET.     Past Medical History:  Diagnosis Date   Anemia    Arthritis    hands   Asthma    childhood   Bacteremia due to Escherichia coli 06/2013   Currently being treated with anti-biotics   CAD in native artery 12/2010   a) Cath for exertional angina & EKG changes: 40% LM, 80% mid LAD.  95% ost cX, 80-90% PDA --> CABG X3; b) Post CABG CATH for + Myoview with basal anterior ischemia -> Ost LAD 80% (diffuse) then 100% after SP2, RI 70% (too small for PCI), Ost-prox Cx 99% & OM1 90%, OstrPDA 80% (small); Occluded SVG-rPDA.  Patent LIMA-dLAD, SVG-OM: culprit ~ p-m LAD pre-LIMA & RI - not good PCI target --> Med Rx   CKD (chronic kidney disease) stage 3, GFR 30-59 ml/min (HCC)    Degenerative disc disease, cervical    Degenerative disc disease, cervical [722.4]   Diabetes mellitus without complication (HCC)    Type II   Dyspnea    Endometrial cancer ([place]) 11/2013   Treated with TAH with pelvic lymphadenectomy followed by radiation and chemotherapy   GERD (gastroesophageal reflux disease)    Hearing loss    bilateral - no hearing aids   Heart murmur    History of asthma     childhood   History  of blood transfusion    History of pneumonia    "2-3 times"   History of unstable angina November/December 2012   T wave inversions in inferolateral leads.  No stress test performed.   Hyperlipidemia LDL goal <70    Hypertension, essential, benign    Neuropathy    hands   Pneumonia 2018   Renal cell carcinoma ([place]) 11/18/2008   T2aNx s/p partial left nephrectomy   S/P CABG x 3 12/2010   LIMA-LAD, SVG RPL, SVG-Circumflex   Stroke ([place])    TIA- no residual    TIA (transient ischemic attack) 1992 &  2010    Past Surgical History:  Procedure Laterality Date   ANTERIOR CERVICAL DECOMP/DISCECTOMY FUSION  10/11/2005    multi-level   AV FISTULA PLACEMENT Left 08/03/2017   Procedure: ARTERIOVENOUS (AV) FISTULA CREATION LEFT ARM;  Surgeon: Rosetta Posner, MD;  Location: [facility];  Service: Vascular;  Laterality: Left;   BRONCHIAL BIOPSY  12/30/2020   Procedure: BRONCHIAL BIOPSIES;  Surgeon: Garner Nash, DO;  Location: [facility];  Service: Pulmonary;;   BRONCHIAL BIOPSY  02/03/2021   Procedure: BRONCHIAL BIOPSIES;  Surgeon: Garner Nash, DO;  Location: [facility];  Service: Pulmonary;;   BRONCHIAL BRUSHINGS  12/30/2020   Procedure: BRONCHIAL BRUSHINGS;  Surgeon: Garner Nash, DO;  Location: [facility];  Service: Pulmonary;;   BRONCHIAL BRUSHINGS  02/03/2021   Procedure: BRONCHIAL BRUSHINGS;  Surgeon: Garner Nash, DO;  Location: [facility];  Service: Pulmonary;;   BRONCHIAL NEEDLE ASPIRATION BIOPSY  12/30/2020   Procedure: BRONCHIAL NEEDLE ASPIRATION BIOPSIES;  Surgeon: Garner Nash, DO;  Location: [place];  Service: Pulmonary;;   BRONCHIAL NEEDLE ASPIRATION BIOPSY  02/03/2021   Procedure: BRONCHIAL NEEDLE ASPIRATION BIOPSIES;  Surgeon: Garner Nash, DO;  Location: [facility];  Service: Pulmonary;;   CARDIAC CATHETERIZATION  12/24/10   40% left main, 80% mid LAD, 95% ostial circumflex, 80-90% PDA.   CARDIAC CATHETERIZATION N/A 07/01/2014   Procedure: Left Heart Cath and Cors/Grafts Angiography;  Surgeon: Leonie Man, MD;  [facility]: [facility] CV LAB:  For Abn Nuc @ [facility]: Ost LAD 80%, mid LAD 100% after S/P 2, 70% RI (no PCI target), ost-prox Cx 99%, OM1 90%. Ost rPDA 80% (~ pre-CABG), occluded SVG-rPDA, patent LIMA-dLAD, SVG OM; potential culprit for abn Nuc scan = pLAD Dz, small RI 70% or PDA -> med Rx   CAROTID ENDARTERECTOMY Left    CHOLECYSTECTOMY  ~ [place] GRAFT  12/25/2010   Procedure: CORONARY ARTERY BYPASS GRAFTING (CABGX3 - LIMA-LAD, SVG RPL, SVG-Circumflex);  Surgeon: Rexene Alberts, MD;  Location: [facility];  Service: Open Heart  Surgery;  Laterality: N/A;   DILATATION & CURETTAGE/HYSTEROSCOPY WITH MYOSURE N/A 11/02/2013   Procedure: DILATATION & CURETTAGE/HYSTEROSCOPY WITH MYOSURE ABLATION;  Surgeon: Allena Katz, MD;  Location: [facility];  Service: Gynecology;  Laterality: N/A;   DOPPLER ECHOCARDIOGRAPHY  12/08/2010   EF =>55%,MILD CONCENTRIC LEFT VENTRICULAR HYPERTROPHY   EYE SURGERY Bilateral    Cataract   IR DIALY SHUNT INTRO NEEDLE/INTRACATH INITIAL W/IMG LEFT Left 04/24/2019   IR FLUORO GUIDE CV LINE LEFT  04/26/2019   IR US GUIDE VASC ACCESS LEFT  04/24/2019   IR US GUIDE VASC ACCESS LEFT  04/26/2019   LEFT HEART CATHETERIZATION WITH CORONARY ANGIOGRAM N/A 12/24/2010   Procedure: LEFT HEART CATHETERIZATION WITH CORONARY ANGIOGRAM;  Surgeon: Leonie Man, MD;  Location: [facility] CATH LAB;  Service: Cardiovascular;  Laterality: N/A;   NM MYOCAR SINGLE W/SPECT  07/26/2007   EF 79%, LEFT VENT.FUNCTION NORMAL   PARTIAL NEPHRECTOMY Left 11/18/2008   left partial nephrectomy for renal cell CA   PET Myocardial Perfusion Scan  06/13/2014   At [place]: Moderate size, mild severity completely reversible defect involving the basal anterior, mid anterior and apical anterior segments consistent with ischemia. EF 65% with normal global function.   TONSILLECTOMY     "in college"   TOTAL ABDOMINAL HYSTERECTOMY  November 2015    At [facility]: Robotic procedure with pelvic lymphadenectomy   TRANSTHORACIC ECHOCARDIOGRAM  06/13/2014   At [facility]: EF 60-65%. Grade 1 diastolic dysfunction. Mild MR. Aortic sclerosis. Moderate pulmonary hypertension   TUBAL LIGATION  ~ 1984   UPPER GI ENDOSCOPY     VIDEO BRONCHOSCOPY WITH RADIAL ENDOBRONCHIAL ULTRASOUND  12/30/2020   Procedure: RADIAL ENDOBRONCHIAL ULTRASOUND;  Surgeon: Garner Nash, DO;  Location: [facility];  Service: Pulmonary;;   VIDEO BRONCHOSCOPY WITH RADIAL ENDOBRONCHIAL ULTRASOUND  02/03/2021   Procedure: RADIAL ENDOBRONCHIAL ULTRASOUND;  Surgeon:  Garner Nash, DO;  Location: [facility];  Service: Pulmonary;;   WISDOM TOOTH EXTRACTION         Inpatient Medications: Scheduled Meds:  aspirin  81 mg Oral QHS   atorvastatin  80 mg Oral QHS   Chlorhexidine Gluconate Cloth  6 each Topical Q0600   isosorbide mononitrate  60 mg Oral Daily   magnesium oxide  400 mg Oral Daily   metoprolol tartrate  50 mg Oral BID   pantoprazole  40 mg Oral Daily   sevelamer carbonate  800 mg Oral TID WC   Continuous Infusions:  heparin 700 Units/hr (10/13/21 0323)   PRN Meds: nitroGLYCERIN, sevelamer carbonate  Allergies:    Allergies  Allergen Reactions   Sulfa Antibiotics Other (See Comments)    "AKI"    Erythromycin Itching   Pravastatin Other (See Comments)    Unknown reaction   Simvastatin Other (See Comments)    Reaction not known   Codeine Nausea Only and Other (See Comments)    Tolerate Hycodan   Gemfibrozil Itching    Social History:   Social History   Socioeconomic History   Marital status: Married    Spouse name: [name]   Number of children: 2   Years of education: many   Highest education level: Not on file  Occupational History   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  -- Currently being very busy taking care of 2 foster children that are staying with her daughter.  A 81-year-old and a 29-year-old that they're trying to adopt.  She is very excited about the possibility of becoming a Grandmother.   She does  walk and getting exercise, but she is wanting to get back into more activities just because she has really been limited due to  her arthritic pains. She used to do things like walking and biking, and she may try to do some biking again, or at least stationary biking.    Does not smoke, does not drink.   Social Determinants of Health   Financial Resource Strain: Not on file  Food Insecurity: Not on file  Transportation Needs: Not on file  Physical Activity: Not on file  Stress: Not on file  Social Connections: Not on file  Intimate Partner Violence: Not on file    Family History:   Family History  Problem Relation Age of Onset   AAA (abdominal aortic aneurysm) Mother    Multiple myeloma Mother    GER disease Mother    Coronary artery disease Father    Heart disease Father    Coronary artery disease Sister    Coronary artery disease Brother    Heart disease Brother    Stroke Maternal Grandmother    Heart attack Neg Hx      ROS:  Please see the history of present illness.  All other ROS reviewed and negative.     Physical Exam/Data:   Vitals:   10/13/21 0245 10/13/21 0330 10/13/21 0500 10/13/21 0545  BP: 129/72 116/71 138/65 134/73  Pulse: 84 83 85 84  Resp: 13 13 19 12   Temp:   98 F (36.7 C)   TempSrc:   Oral   SpO2: 97% 95% 90% 94%  Weight:      Height:       No intake or output data in the 24 hours ending 10/13/21 0613    10/13/2021   12:22 AM 03/04/2021   11:59 AM 02/18/2021   12:00 PM  Last 3 Weights  Weight (lbs) 140 lb 142 lb 142 lb 3.2 oz  Weight (kg) 63.504 kg 64.411 kg 64.501 kg     Body mass index is 27.34 kg/m.  General:  Well nourished, well developed, in mild distress HEENT: normal Neck: no JVD Vascular: No carotid bruits; Distal pulses 2+ bilaterally Cardiac:  normal S1, S2; RRR; no murmur  Lungs: Poor airflow Abd: soft, nontender, no hepatomegaly  Ext: no edema Musculoskeletal:  No deformities, BUE and BLE strength normal and equal Skin:  warm and dry  Neuro:  CNs 2-12 intact, no focal abnormalities noted Psych:  Normal affect   EKG:  The EKG was personally reviewed and demonstrates:  ST depression in lateral leads  Laboratory Data:  High Sensitivity Troponin:   Recent Labs  Lab 10/13/21 0026 10/13/21 0234  TROPONINIHS 51* 1,478*     Chemistry Recent Labs  Lab 10/13/21 0026  NA 136  K 6.0*  CL 98  CO2 27  GLUCOSE 188*  BUN 57*  CREATININE 4.71*  CALCIUM 8.7*  GFRNONAA 9*  ANIONGAP 11    No results for input(s): "PROT", "ALBUMIN", "AST", "ALT", "ALKPHOS", "BILITOT" in the last 168 hours. Lipids No results for input(s): "CHOL", "TRIG", "HDL", "LABVLDL", "LDLCALC", "CHOLHDL" in the last 168 hours.  Hematology Recent Labs  Lab 10/13/21 0026  WBC 13.9*  RBC 3.10*  HGB 11.5*  HCT 33.5*  MCV 108.1*  MCH 37.1*  MCHC 34.3  RDW 15.8*  PLT 188   Thyroid No results for input(s): "TSH", "FREET4" in the last 168 hours.  BNP Recent Labs  Lab 10/13/21 0027  BNP 898.8*    DDimer No results for input(s): "DDIMER" in the last 168 hours.   Radiology/Studies:  DG Chest Port 1 View  Result Date: 10/13/2021 CLINICAL DATA:  Chest  pain and recent pneumonia EXAM: PORTABLE CHEST 1 VIEW COMPARISON:  09/29/2021 FINDINGS: The heart size and mediastinal contours are within normal limits. Both lungs are clear. The visualized skeletal structures are unremarkable. Remote median sternotomy IMPRESSION: No active disease. Electronically Signed   By: Ulyses Jarred M.D.   On: 10/13/2021 00:57     Assessment and Plan:   # CAD s/p CABG # NSTEMI # ESRD # HLD  -Patient presenting with classical chest pain and SOB with EKG depressions and troponin >1400 -Of note,   Last cardiac catheterization here was in June 2016 showing occluded graft to the PDA with severe ostial PDA disease, but only moderate mid RCA disease and therefore stable flow to the posterolateral system.  Patent LIMA to diffusely diseased LAD and patent vein  graft to OM1 with moderate disease in the AV groove circumflex lesion OM 2.   Based on previous discussions, we have decided to avoid further ischemic evaluation unless she has symptoms.  The cath in 2016 was in response to an abnormal Cardiac Stress PET.   -Start IV heparin drip -Load with aspirin 325 followed by 81 mg daily -Statin 80 mg -Echo in AM -Beta blockers -As K is 6 will prefer for her to get dialysis first. Then plan LHC this afternoon -Telemetry -NPO  For questions or updates, please contact [facility] [place] Please consult [email or web address] for contact info under    Signed, Jaci Lazier, MD  10/13/2021 6:13 AM

## 2021-10-13 NOTE — Progress Notes (Signed)
Pt receives out-pt HD at [facility] on TTS. Pt arrives at 11:00 for 11:20 chair time. Will assist as needed.   Melven Sartorius Renal Navigator [phone]

## 2021-10-13 NOTE — CV Procedure (Addendum)
Total occlusion distal LAD.  Proximal to mid LAD with diffuse high-grade disease, 70 to 80% in 2 spots, heavily calcified. 60 to 70% distal left main, heavily calcified. 99% ostial to proximal circumflex followed total occlusion of the first obtuse marginal, segmental 95% large second obtuse marginal.  After the marginal bifurcates the distal anastomosis from the saphenous vein graft is noted to be totally occluded in a retrograde fashion.  The circumflex territory is not amenable to PCI in any predictable fashion. Right coronary contains 50 to 70% segmental mid to distal disease with significant stenoses in the PDA and largest left ventricular branch. LIMA to apical LAD is patent.  The LAD around the apex is totally occluded and the LAD proximal to the graft insertion site is totally occluded. New/culprit total occlusion of SVG to the obtuse marginal. Chronic previously demonstrated total occlusion of SVG to RCA. Overall LV function with EF 45 to 50% with normal EDP (12 mmHg).  Patient had dialysis today.   Severe diffuse three-vessel coronary artery disease without percutaneous or repeat surgical options due to segmental nature of disease and heavy calcification throughout the native vessels..  Left main is also heavily/densely calcified and contains significant disease.  Poor overall outcome anticipated.  Will review pictures with colleagues.  Would recommend conservative medical management at this time.  If refractory symptoms, re-discuss revasc (heroic).

## 2021-10-13 NOTE — Progress Notes (Signed)
   Rounding Note    Patient Name: Dana Jackson Date of Encounter: 10/13/2021  [facility] Cardiologist: Dr Ellyn Hack  As above.  I agree with the fellow's assessment and plan.  Patient is presently on dialysis.  Patient is a 76 year old female with end-stage renal disease dialysis dependent, coronary artery disease status post coronary bypass and graft, hypertension, hyperlipidemia, renal cell carcinoma status post partial left nephrectomy, carcinoid admitted with non-ST elevation myocardial infarction.  Troponin is elevated and electrocardiogram showed inferolateral ST depression.  She is presently pain-free and ST changes have improved.  Continue aspirin, heparin, beta-blocker and statin.  Plan will be to proceed with cardiac catheterization.  The risk and benefits including myocardial infarction, CVA and death discussed and she agrees to proceed.  We will arrange for later this afternoon or tomorrow depending on schedule.  Await echocardiogram.  For questions or updates, please contact [facility] Please consult [email or web address] for contact info under        Signed, Kirk Ruths, MD  10/13/2021, 11:04 AM

## 2021-10-13 NOTE — ED Notes (Addendum)
Report given to [facility]. Pt is going to 6E24 after cath lab procedure. Taken to cath lab at this time by cath lab staff.

## 2021-10-13 NOTE — Progress Notes (Signed)
  Echocardiogram 2D Echocardiogram has been performed.  Dana Jackson 10/13/2021, 2:45 PM

## 2021-10-13 NOTE — ED Notes (Signed)
Patient, belongings, and paperwork transferred to [facility] ED at this time via [facility]

## 2021-10-13 NOTE — ED Notes (Signed)
SBAR report called to [facility] ED for ED to ED transport.

## 2021-10-14 ENCOUNTER — Other Ambulatory Visit (HOSPITAL_COMMUNITY): Payer: Self-pay

## 2021-10-14 ENCOUNTER — Encounter (HOSPITAL_COMMUNITY): Payer: Self-pay | Admitting: Interventional Cardiology

## 2021-10-14 DIAGNOSIS — I214 Non-ST elevation (NSTEMI) myocardial infarction: Secondary | ICD-10-CM | POA: Diagnosis not present

## 2021-10-14 LAB — CBC
HCT: 28.4 % — ABNORMAL LOW (ref 36.0–46.0)
Hemoglobin: 9.7 g/dL — ABNORMAL LOW (ref 12.0–15.0)
MCH: 36.3 pg — ABNORMAL HIGH (ref 26.0–34.0)
MCHC: 34.2 g/dL (ref 30.0–36.0)
MCV: 106.4 fL — ABNORMAL HIGH (ref 80.0–100.0)
Platelets: 156 10*3/uL (ref 150–400)
RBC: 2.67 MIL/uL — ABNORMAL LOW (ref 3.87–5.11)
RDW: 15.7 % — ABNORMAL HIGH (ref 11.5–15.5)
WBC: 4.8 10*3/uL (ref 4.0–10.5)
nRBC: 0 % (ref 0.0–0.2)

## 2021-10-14 LAB — GLUCOSE, CAPILLARY
Glucose-Capillary: 102 mg/dL — ABNORMAL HIGH (ref 70–99)
Glucose-Capillary: 113 mg/dL — ABNORMAL HIGH (ref 70–99)
Glucose-Capillary: 121 mg/dL — ABNORMAL HIGH (ref 70–99)

## 2021-10-14 LAB — BASIC METABOLIC PANEL
Anion gap: 12 (ref 5–15)
BUN: 27 mg/dL — ABNORMAL HIGH (ref 8–23)
CO2: 31 mmol/L (ref 22–32)
Calcium: 8.9 mg/dL (ref 8.9–10.3)
Chloride: 94 mmol/L — ABNORMAL LOW (ref 98–111)
Creatinine, Ser: 3.24 mg/dL — ABNORMAL HIGH (ref 0.44–1.00)
GFR, Estimated: 14 mL/min — ABNORMAL LOW (ref >60)
Glucose, Bld: 105 mg/dL — ABNORMAL HIGH (ref 70–99)
Potassium: 4 mmol/L (ref 3.5–5.1)
Sodium: 137 mmol/L (ref 135–145)

## 2021-10-14 LAB — MAGNESIUM: Magnesium: 2.1 mg/dL (ref 1.7–2.4)

## 2021-10-14 LAB — HEPATITIS B SURFACE ANTIBODY, QUANTITATIVE: Hep B S AB Quant (Post): 7.8 m[IU]/mL — ABNORMAL LOW (ref >9.9)

## 2021-10-14 LAB — BRAIN NATRIURETIC PEPTIDE: B Natriuretic Peptide: 1267 pg/mL — ABNORMAL HIGH (ref 0.0–100.0)

## 2021-10-14 LAB — C-REACTIVE PROTEIN: CRP: 3.4 mg/dL — ABNORMAL HIGH (ref <1.0)

## 2021-10-14 MED ORDER — CLOPIDOGREL BISULFATE 75 MG PO TABS
75.0000 mg | ORAL_TABLET | Freq: Every day | ORAL | Status: DC
  Administered 2021-10-14: 75 mg via ORAL
  Filled 2021-10-14: qty 1

## 2021-10-14 MED ORDER — CLOPIDOGREL BISULFATE 75 MG PO TABS
75.0000 mg | ORAL_TABLET | Freq: Every day | ORAL | 1 refills | Status: DC
  Filled 2021-10-14: qty 30, 30d supply, fill #0

## 2021-10-14 NOTE — Care Management Obs Status (Signed)
[facility] NOTIFICATION   Patient Details  Name: Dana Jackson MRN: [id number] Date of Birth: 1945/02/20   Medicare Observation Status Notification Given:  Yes    Bethena Roys, RN 10/14/2021, 11:41 AM

## 2021-10-14 NOTE — Progress Notes (Signed)
[facility] Progress Note  Subjective: seen in room, no c/o, going home today, excited  Vitals:   10/13/21 1711 10/13/21 2118 10/14/21 0443 10/14/21 0804  BP: (!) 128/53 (!) 105/55 (!) 164/66 (!) 156/66  Pulse: 66  91 70  Resp: '18  20 18  '$ Temp: 97.7 F (36.5 C)  98.9 F (37.2 C) 98.7 F (37.1 C)  TempSrc: Oral  Oral Oral  SpO2: 95%  95% 96%  Weight:      Height:        Exam: Gen alert, no distress, pleasant No jvd or bruits Chest clear bilat RRR no MRG Abd soft ntnd no mass or ascites +bs MS no joint effusions or deformity Ext no LE edema Neuro is alert, Ox 3 , nf    LUA AVF+bruit    Home meds include - aspriin, lipitor, cinacalcet 30 qd, famotidine, furosemide 20 biw, gabapentin, imdur 60, metoprolol 50 bid, sl ntg, prilosec, sevelamer 1 ac tid, prns/ vits/ supps    OP HD: [facility]  4h  400 /1.5  62.7kg   2/2 bath  Hep 2000   LUA AVF - mircera 50 q2, ordered 9/19 but has not rec'd yet, not sure why - venofer '50mg'$  weekly, just started - last HD 9/30, post wt 62.8kg     Assessment/ Plan: Chest pain / NSTEMI / hx CABG - rx'd w/ IV heparin then LHC which showed diffuse 3VCAD, cardiology recommended medical Rx, PCI of RCA if recurrent symptoms. For dc today.  ESRD - on HD TTS. Resume OP HD tomorrow.  Volume - CXR neg for edema, no vol excess on exam. 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  MBD ckd - Ca in range, cont renvela as binder  Dana Jackson 10/14/2021, 11:42 AM   Recent Labs  Lab 10/13/21 0026 10/13/21 0500 10/14/21 0216  HGB 11.5*  --  9.7*  CALCIUM 8.7*  --  8.9  CREATININE 4.71* 4.84* 3.24*  K 6.0*  --  4.0   No results for input(s): "IRON", "TIBC", "FERRITIN" in the last 168 hours. Inpatient medications:  aspirin  81 mg Oral QHS   atorvastatin  80 mg Oral QHS   clopidogrel  75 mg Oral Daily   heparin  5,000 Units Subcutaneous Q8H   insulin  aspart  0-6 Units Subcutaneous Q4H   isosorbide mononitrate  60 mg Oral Daily   magnesium oxide  400 mg Oral Daily   metoprolol tartrate  50 mg Oral BID   pantoprazole  40 mg Oral Daily   sevelamer carbonate  800 mg Oral TID WC   sodium chloride flush  3 mL Intravenous Q12H   sodium chloride flush  3 mL Intravenous Q12H    sodium chloride     sodium chloride, acetaminophen, nitroGLYCERIN, ondansetron (ZOFRAN) IV, oxyCODONE, sevelamer carbonate, sodium chloride flush

## 2021-10-14 NOTE — Progress Notes (Signed)
Rounding Note    Patient Name: Dana Jackson Date of Encounter: 10/14/2021  [place] Cardiologist: Dr Ellyn Hack  Subjective   No CP or dyspnea  Inpatient Medications    Scheduled Meds:  aspirin  81 mg Oral QHS   atorvastatin  80 mg Oral QHS   heparin  5,000 Units Subcutaneous Q8H   insulin aspart  0-6 Units Subcutaneous Q4H   isosorbide mononitrate  60 mg Oral Daily   magnesium oxide  400 mg Oral Daily   metoprolol tartrate  50 mg Oral BID   pantoprazole  40 mg Oral Daily   sevelamer carbonate  800 mg Oral TID WC   sodium chloride flush  3 mL Intravenous Q12H   sodium chloride flush  3 mL Intravenous Q12H   Continuous Infusions:  sodium chloride     PRN Meds: sodium chloride, acetaminophen, nitroGLYCERIN, ondansetron (ZOFRAN) IV, oxyCODONE, sevelamer carbonate, sodium chloride flush   Vital Signs    Vitals:   10/13/21 1657 10/13/21 1711 10/13/21 2118 10/14/21 0443  BP:  (!) 128/53 (!) 105/55 (!) 164/66  Pulse: (!) 0 66  91  Resp:  18  20  Temp:  97.7 F (36.5 C)  98.9 F (37.2 C)  TempSrc:  Oral  Oral  SpO2:  95%  95%  Weight:      Height:        Intake/Output Summary (Last 24 hours) at 10/14/2021 0749 Last data filed at 10/13/2021 1153 Gross per 24 hour  Intake --  Output 1.3 ml  Net -1.3 ml      10/13/2021   12:50 PM 10/13/2021    7:40 AM 10/13/2021   12:22 AM  Last 3 Weights  Weight (lbs) 138 lb 10.7 oz 142 lb 3.2 oz 140 lb  Weight (kg) 62.9 kg 64.5 kg 63.504 kg      Telemetry    Sinus - Personally Reviewed    GEN: No acute distress.   Neck: No JVD Cardiac: RRR, no murmurs, rubs, or gallops.  Respiratory: Clear to auscultation bilaterally. GI: Soft, nontender, non-distended  MS: No edema Neuro:  Nonfocal  Psych: Normal affect   Labs    High Sensitivity Troponin:   Recent Labs  Lab 10/13/21 0026 10/13/21 0234  TROPONINIHS 51* 1,478*     Chemistry Recent Labs  Lab 10/13/21 0026 10/13/21 0500 10/14/21 0216  NA  136  --  137  K 6.0*  --  4.0  CL 98  --  94*  CO2 27  --  31  GLUCOSE 188*  --  105*  BUN 57*  --  27*  CREATININE 4.71* 4.84* 3.24*  CALCIUM 8.7*  --  8.9  MG  --  2.8* 2.1  GFRNONAA 9* 9* 14*  ANIONGAP 11  --  12     Hematology Recent Labs  Lab 10/13/21 0026 10/14/21 0216  WBC 13.9* 4.8  RBC 3.10* 2.67*  HGB 11.5* 9.7*  HCT 33.5* 28.4*  MCV 108.1* 106.4*  MCH 37.1* 36.3*  MCHC 34.3 34.2  RDW 15.8* 15.7*  PLT 188 156   BNP Recent Labs  Lab 10/13/21 0027 10/14/21 0216  BNP 898.8* 1,267.0*    Radiology    CARDIAC CATHETERIZATION  Result Date: 10/13/2021 CONCLUSIONS: New culprit total occlusion of SVG to the obtuse marginal Chronic total occlusion of SVG to PDA Patent LIMA to apical LAD which is totally occluded proximal to the graft insertion site and in the apical segment beyond the graft and insertion  site. 70% distal left main.  70% ostial LAD and sequential 80% stenoses in the proximal and mid LAD Ostial 95 to 99% heavily calcified circumflex with severe diffuse disease in the dominant obtuse marginal. Moderate mid RCA disease and 70 to 80% distal disease.  95% stenosis in the PDA and 70% proximal LV branch disease. Preserved LV function with EF approximately 50 to 60% with EDP 12 mmHg. RECOMMENDATIONS: There are no good PCI or surgical options.  We will have other team members to look at pictures and give their opinion.  If refractory angina unresponsive to medical therapy we could treat the right coronary however it is the least severely diseased vessel of the 3 major epicardial coronaries.  I do not believe repeat surgery is an option at her age and with end-stage kidney disease.   ECHOCARDIOGRAM COMPLETE  Result Date: 10/13/2021    ECHOCARDIOGRAM REPORT   Patient Name:   Dana Jackson Date of Exam: 10/13/2021 Medical Rec #:  [id number]      Height:       60.0 in Accession #:    [id number]     Weight:       138.7 lb Date of Birth:  1945-05-08     BSA:          1.598  m Patient Age:    76 years       BP:           156/63 mmHg Patient Gender: F              HR:           65 bpm. Exam Location:  Inpatient Procedure: 2D Echo Indications:    NSTEMI  History:        Patient has prior history of Echocardiogram examinations, most                 recent 03/18/2019. CAD; Risk Factors:Hypertension, Diabetes and                 Dyslipidemia.  Sonographer:    Harvie Junior Referring Phys: [id number] VASUNDHRA RATHORE  Sonographer Comments: Technically difficult study due to poor echo windows and patient is obese. Image acquisition challenging due to patient body habitus. IMPRESSIONS  1. Left ventricular ejection fraction, by estimation, is 60 to 65%. The left ventricle has normal function. The left ventricle has no regional wall motion abnormalities. There is mild concentric left ventricular hypertrophy. Left ventricular diastolic parameters are consistent with Grade II diastolic dysfunction (pseudonormalization). Elevated left atrial pressure.  2. Right ventricular systolic function is normal. The right ventricular size is normal. There is normal pulmonary artery systolic pressure. The estimated right ventricular systolic pressure is 62.7 mmHg.  3. The mitral valve is normal in structure. Mild mitral valve regurgitation. Moderate to severe mitral annular calcification.  4. The aortic valve is tricuspid. There is mild calcification of the aortic valve. There is mild thickening of the aortic valve. Aortic valve regurgitation is not visualized. Aortic valve sclerosis/calcification is present, without any evidence of aortic stenosis.  5. The inferior vena cava is normal in size with greater than 50% respiratory variability, suggesting right atrial pressure of 3 mmHg. FINDINGS  Left Ventricle: Left ventricular ejection fraction, by estimation, is 60 to 65%. The left ventricle has normal function. The left ventricle has no regional wall motion abnormalities. The left ventricular internal cavity size  was normal in size. There is  mild concentric left ventricular hypertrophy. Left ventricular diastolic parameters are  consistent with Grade II diastolic dysfunction (pseudonormalization). Elevated left atrial pressure. Right Ventricle: The right ventricular size is normal. No increase in right ventricular wall thickness. Right ventricular systolic function is normal. There is normal pulmonary artery systolic pressure. The tricuspid regurgitant velocity is 2.39 m/s, and  with an assumed right atrial pressure of 3 mmHg, the estimated right ventricular systolic pressure is 16.1 mmHg. Left Atrium: Left atrial size was normal in size. Right Atrium: Right atrial size was normal in size. Pericardium: Trivial pericardial effusion is present. Mitral Valve: The mitral valve is normal in structure. Moderate to severe mitral annular calcification. Mild mitral valve regurgitation. Tricuspid Valve: The tricuspid valve is normal in structure. Tricuspid valve regurgitation is trivial. Aortic Valve: The aortic valve is tricuspid. There is mild calcification of the aortic valve. There is mild thickening of the aortic valve. Aortic valve regurgitation is not visualized. Aortic valve sclerosis/calcification is present, without any evidence of aortic stenosis. Aortic valve mean gradient measures 4.0 mmHg. Aortic valve peak gradient measures 7.7 mmHg. Aortic valve area, by VTI measures 2.47 cm. Pulmonic Valve: The pulmonic valve was normal in structure. Pulmonic valve regurgitation is trivial. Aorta: The aortic root is normal in size and structure. Venous: The inferior vena cava is normal in size with greater than 50% respiratory variability, suggesting right atrial pressure of 3 mmHg. IAS/Shunts: No atrial level shunt detected by color flow Doppler.  LEFT VENTRICLE PLAX 2D LVIDd:         3.90 cm     Diastology LVIDs:         2.50 cm     LV e' medial:    6.68 cm/s LV PW:         1.20 cm     LV E/e' medial:  19.0 LV IVS:        1.20 cm      LV e' lateral:   7.77 cm/s LVOT diam:     1.80 cm     LV E/e' lateral: 16.3 LV SV:         79 LV SV Index:   49 LVOT Area:     2.54 cm  LV Volumes (MOD) LV vol d, MOD A2C: 69.5 ml LV vol d, MOD A4C: 79.3 ml LV vol s, MOD A2C: 20.5 ml LV vol s, MOD A4C: 34.5 ml LV SV MOD A2C:     49.0 ml LV SV MOD A4C:     79.3 ml LV SV MOD BP:      50.3 ml RIGHT VENTRICLE RV Basal diam:  3.10 cm RV Mid diam:    2.70 cm RV S prime:     7.70 cm/s TAPSE (M-mode): 1.6 cm LEFT ATRIUM             Index        RIGHT ATRIUM           Index LA diam:        3.00 cm 1.88 cm/m   RA Area:     12.60 cm LA Vol (A2C):   33.2 ml 20.78 ml/m  RA Volume:   27.20 ml  17.03 ml/m LA Vol (A4C):   44.4 ml 27.79 ml/m LA Biplane Vol: 39.8 ml 24.91 ml/m  AORTIC VALVE                     PULMONIC VALVE AV Area (Vmax):    2.22 cm      PV Vmax:  0.98 m/s AV Area (Vmean):   2.24 cm      PV Peak grad:     3.8 mmHg AV Area (VTI):     2.47 cm      PR End Diast Vel: 4.84 msec AV Vmax:           139.00 cm/s AV Vmean:          100.000 cm/s AV VTI:            0.320 m AV Peak Grad:      7.7 mmHg AV Mean Grad:      4.0 mmHg LVOT Vmax:         121.00 cm/s LVOT Vmean:        88.200 cm/s LVOT VTI:          0.310 m LVOT/AV VTI ratio: 0.97  AORTA Ao Root diam: 2.90 cm Ao Asc diam:  3.20 cm MITRAL VALVE                TRICUSPID VALVE MV Area (PHT): 2.55 cm     TR Peak grad:   22.8 mmHg MV Decel Time: 298 msec     TR Vmax:        239.00 cm/s MR Peak grad: 67.6 mmHg MR Vmax:      411.00 cm/s   SHUNTS MV E velocity: 127.00 cm/s  Systemic VTI:  0.31 m MV A velocity: 135.00 cm/s  Systemic Diam: 1.80 cm MV E/A ratio:  0.94 Mihai Croitoru MD Electronically signed by Sanda Klein MD Signature Date/Time: 10/13/2021/2:54:11 PM    Final    DG Chest Port 1 View  Result Date: 10/13/2021 CLINICAL DATA:  Chest pain and recent pneumonia EXAM: PORTABLE CHEST 1 VIEW COMPARISON:  09/29/2021 FINDINGS: The heart size and mediastinal contours are within normal limits. Both  lungs are clear. The visualized skeletal structures are unremarkable. Remote median sternotomy IMPRESSION: No active disease. Electronically Signed   By: Ulyses Jarred M.D.   On: 10/13/2021 00:57      Patient Profile     76 y.o. female with past medical history of end-stage renal disease dialysis dependent, coronary artery disease status post coronary bypass and graft, diabetes mellitus, hypertension, hyperlipidemia, prior stroke/TIA, renal cell carcinoma status post partial left nephrectomy, carcinoid tumor admitted with non-ST elevation myocardial infarction.  Echocardiogram showed normal LV function, mild left ventricular hypertrophy, grade 2 diastolic dysfunction, mild mitral regurgitation.  Cardiac catheterization revealed new total occlusion of saphenous vein graft to the obtuse marginal, chronic total occlusion of saphenous vein graft to PDA, patent LIMA to the apical LAD which was totaled proximally and distal to the insertion site, 70% left main, 70% followed by 80% proximal and mid LAD, 99% ostial circumflex, 70 to 80% right coronary artery, 95% PDA and normal LV function.  Medical therapy recommended.  If symptoms persist PCI of the right coronary artery could be considered.  Assessment & Plan    1 non-ST elevation myocardial infarction-as above cardiac catheterization as outlined.  Plan medical therapy.  PCI of RCA could be considered if recurrent symptoms.  We will treat with aspirin, Plavix, Lipitor, isosorbide and Toprol.  Note LV function is normal.  She has had no recurrent chest pain.  2 end-stage renal disease-dialysis per nephrology.  3 hyperlipidemia-continue statin.  4 hypertension-continue present medications.  Patient can be discharged from a cardiac standpoint on present medications.  We will arrange follow-up with APP in 2 to 4 weeks.  Follow-up with Dr. Ellyn Hack  in 3 months.  We will sign off.  Please call with questions.  For questions or updates, please contact [place] Please consult [email or web address] for contact info under        Signed, Kirk Ruths, MD  10/14/2021, 7:49 AM

## 2021-10-14 NOTE — Evaluation (Signed)
Clinical/Bedside Swallow Evaluation Patient Details  Name: Dana Jackson MRN: [id number] Date of Birth: 09/14/1945  Today's Date: 10/14/2021 Time: SLP Start Time (ACUTE ONLY): 3532 SLP Stop Time (ACUTE ONLY): 1007 SLP Time Calculation (min) (ACUTE ONLY): 13 min  Past Medical History:  Past Medical History:  Diagnosis Date   Anemia    Arthritis    hands   Asthma    childhood   Bacteremia due to Escherichia coli 06/2013   Currently being treated with anti-biotics   CAD in native artery 12/2010   a) Cath for exertional angina & EKG changes: 40% LM, 80% mid LAD.  95% ost cX, 80-90% PDA --> CABG X3; b) Post CABG CATH for + Myoview with basal anterior ischemia -> Ost LAD 80% (diffuse) then 100% after SP2, RI 70% (too small for PCI), Ost-prox Cx 99% & OM1 90%, OstrPDA 80% (small); Occluded SVG-rPDA.  Patent LIMA-dLAD, SVG-OM: culprit ~ p-m LAD pre-LIMA & RI - not good PCI target --> Med Rx   CKD (chronic kidney disease) stage 3, GFR 30-59 ml/min (HCC)    Degenerative disc disease, cervical    Degenerative disc disease, cervical [722.4]   Diabetes mellitus without complication (HCC)    Type II   Dyspnea    Endometrial cancer ([place]) 11/2013   Treated with TAH with pelvic lymphadenectomy followed by radiation and chemotherapy   GERD (gastroesophageal reflux disease)    Hearing loss    bilateral - no hearing aids   Heart murmur    History of asthma     childhood   History of blood transfusion    History of pneumonia    "2-3 times"   History of unstable angina November/December 2012   T wave inversions in inferolateral leads.  No stress test performed.   Hyperlipidemia LDL goal <70    Hypertension, essential, benign    Neuropathy    hands   Pneumonia 2018   Renal cell carcinoma ([place]) 11/18/2008   T2aNx s/p partial left nephrectomy   S/P CABG x 3 12/2010   LIMA-LAD, SVG RPL, SVG-Circumflex   Stroke ([place])    TIA- no residual    TIA (transient ischemic attack) 1992 &  2010   Past  Surgical History:  Past Surgical History:  Procedure Laterality Date   ANTERIOR CERVICAL DECOMP/DISCECTOMY FUSION  10/11/2005   multi-level   AV FISTULA PLACEMENT Left 08/03/2017   Procedure: ARTERIOVENOUS (AV) FISTULA CREATION LEFT ARM;  Surgeon: Rosetta Posner, MD;  Location: [facility];  Service: Vascular;  Laterality: Left;   BRONCHIAL BIOPSY  12/30/2020   Procedure: BRONCHIAL BIOPSIES;  Surgeon: Garner Nash, DO;  Location: [facility];  Service: Pulmonary;;   BRONCHIAL BIOPSY  02/03/2021   Procedure: BRONCHIAL BIOPSIES;  Surgeon: Garner Nash, DO;  Location: [facility];  Service: Pulmonary;;   BRONCHIAL BRUSHINGS  12/30/2020   Procedure: BRONCHIAL BRUSHINGS;  Surgeon: Garner Nash, DO;  Location: [facility];  Service: Pulmonary;;   BRONCHIAL BRUSHINGS  02/03/2021   Procedure: BRONCHIAL BRUSHINGS;  Surgeon: Garner Nash, DO;  Location: [facility];  Service: Pulmonary;;   BRONCHIAL NEEDLE ASPIRATION BIOPSY  12/30/2020   Procedure: BRONCHIAL NEEDLE ASPIRATION BIOPSIES;  Surgeon: Garner Nash, DO;  Location: [facility];  Service: Pulmonary;;   BRONCHIAL NEEDLE ASPIRATION BIOPSY  02/03/2021   Procedure: BRONCHIAL NEEDLE ASPIRATION BIOPSIES;  Surgeon: Garner Nash, DO;  Location: [facility];  Service: Pulmonary;;   CARDIAC CATHETERIZATION  12/24/10   40% left main, 80% mid LAD,  95% ostial circumflex, 80-90% PDA.   CARDIAC CATHETERIZATION N/A 07/01/2014   Procedure: Left Heart Cath and Cors/Grafts Angiography;  Surgeon: Leonie Man, MD;  Location: [facility]:  For Abn Nuc @ [facility]: Ost LAD 80%, mid LAD 100% after S/P 2, 70% RI (no PCI target), ost-prox Cx 99%, OM1 90%. Ost rPDA 80% (~ pre-CABG), occluded SVG-rPDA, patent LIMA-dLAD, SVG OM; potential culprit for abn Nuc scan = pLAD Dz, small RI 70% or PDA -> med Rx   CAROTID ENDARTERECTOMY Left    CHOLECYSTECTOMY  ~ [place] GRAFT  12/25/2010   Procedure: CORONARY ARTERY  BYPASS GRAFTING (CABGX3 - LIMA-LAD, SVG RPL, SVG-Circumflex);  Surgeon: Rexene Alberts, MD;  Location: [facility];  Service: Open Heart Surgery;  Laterality: N/A;   DILATATION & CURETTAGE/HYSTEROSCOPY WITH MYOSURE N/A 11/02/2013   Procedure: DILATATION & CURETTAGE/HYSTEROSCOPY WITH MYOSURE ABLATION;  Surgeon: Allena Katz, MD;  Location: [facility];  Service: Gynecology;  Laterality: N/A;   DOPPLER ECHOCARDIOGRAPHY  12/08/2010   EF =>55%,MILD CONCENTRIC LEFT VENTRICULAR HYPERTROPHY   EYE SURGERY Bilateral    Cataract   IR DIALY SHUNT INTRO NEEDLE/INTRACATH INITIAL W/IMG LEFT Left 04/24/2019   IR FLUORO GUIDE CV LINE LEFT  04/26/2019   IR US GUIDE VASC ACCESS LEFT  04/24/2019   IR US GUIDE VASC ACCESS LEFT  04/26/2019   LEFT HEART CATH AND CORS/GRAFTS ANGIOGRAPHY N/A 10/13/2021   Procedure: LEFT HEART CATH AND CORS/GRAFTS ANGIOGRAPHY;  Surgeon: Belva Crome, MD;  Location: [facility];  Service: Cardiovascular;  Laterality: N/A;   LEFT HEART CATHETERIZATION WITH CORONARY ANGIOGRAM N/A 12/24/2010   Procedure: LEFT HEART CATHETERIZATION WITH CORONARY ANGIOGRAM;  Surgeon: Leonie Man, MD;  [facility];  Service: Cardiovascular;  Laterality: N/A;   NM MYOCAR SINGLE W/SPECT  07/26/2007   EF 79%, LEFT VENT.FUNCTION NORMAL   PARTIAL NEPHRECTOMY Left 11/18/2008   left partial nephrectomy for renal cell CA   PET Myocardial Perfusion Scan  06/13/2014   At [place]: Moderate size, mild severity completely reversible defect involving the basal anterior, mid anterior and apical anterior segments consistent with ischemia. EF 65% with normal global function.   TONSILLECTOMY     "in college"   TOTAL ABDOMINAL HYSTERECTOMY  November 2015    At [facility]: Robotic procedure with pelvic lymphadenectomy   TRANSTHORACIC ECHOCARDIOGRAM  06/13/2014   At [place]: EF 60-65%. Grade 1 diastolic dysfunction. Mild MR. Aortic sclerosis. Moderate pulmonary hypertension   TUBAL LIGATION  ~  1984   UPPER GI ENDOSCOPY     VIDEO BRONCHOSCOPY WITH RADIAL ENDOBRONCHIAL ULTRASOUND  12/30/2020   Procedure: RADIAL ENDOBRONCHIAL ULTRASOUND;  Surgeon: Garner Nash, DO;  Location: [facility];  Service: Pulmonary;;   VIDEO BRONCHOSCOPY WITH RADIAL ENDOBRONCHIAL ULTRASOUND  02/03/2021   Procedure: RADIAL ENDOBRONCHIAL ULTRASOUND;  Surgeon: Garner Nash, DO;  Location: [facility];  Service: Pulmonary;;   WISDOM TOOTH EXTRACTION     HPI:  Pt is a 76 y.o. female who presented to [facility] ED with complaints of chest pain and shortness of breath. Was diagnosed with NSTEMI. CXR (10/3) revealed no active disease. 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    Assessment / Plan / Recommendation  Clinical Impression  Pt presents with functional  oropharyngeal swallow at bedside this date. RN reports pt taking pills whole without difficulty, though pt reports occasional difficulty swallowing larger pills even at PLOF. Otherwise, pt denied any recent difficulties or changes with swallowing. Oral mechanism examination unremarkable and pt with adequate natural dentition. She self-fed sips of thin liquids by straw, bites of puree and regular textures demonstrating x1 cough post large/consecutive sipping of thin liquids. Subsequent sipping was without incidence concerning for aspiration. Oral prep was swift and clearance of POs full post intake. Recommend continue regular diet/thin liquids with meds whole with water or whole in puree as tolerated. Adherence to universal swallow precautions also encouraged. No further SLP f/u warranted at this time.  SLP Visit Diagnosis: Dysphagia, unspecified (R13.10)    Aspiration Risk  No limitations    Diet Recommendation Regular;Thin liquid   Liquid Administration via:  Cup;Straw Medication Administration: Whole meds with liquid (or whole with puree as tolerated) Supervision: Patient able to self feed Compensations: Slow rate;Small sips/bites Postural Changes: Seated upright at 90 degrees    Other  Recommendations Oral Care Recommendations: Oral care BID    Recommendations for follow up therapy are one component of a multi-disciplinary discharge planning process, led by the attending physician.  Recommendations may be updated based on patient status, additional functional criteria and insurance authorization.  Follow up Recommendations No SLP follow up      Assistance Recommended at Discharge PRN  Functional Status Assessment Patient has not had a recent decline in their functional status  Frequency and Duration            Prognosis Prognosis for Safe Diet Advancement: Good      Swallow Study   General Date of Onset: 10/13/21 HPI: Pt is a 76 y.o. female who presented to [facility] ED with complaints of chest pain and shortness of breath. Was diagnosed with NSTEMI. CXR (10/3) revealed no active disease. 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 Type of Study: Bedside Swallow Evaluation Previous Swallow Assessment: none per EMR Diet Prior to this Study: Regular;Thin liquids Temperature Spikes Noted: No Respiratory Status: Room air History of Recent Intubation: No Behavior/Cognition: Cooperative;Alert;Pleasant mood Oral Cavity Assessment: Within Functional Limits Oral Care Completed by SLP: No Oral Cavity - Dentition: Adequate natural dentition Vision: Functional for self-feeding Self-Feeding Abilities: Able to feed self Patient Positioning: Upright in bed;Postural control adequate for testing Baseline Vocal Quality: Normal Volitional Cough: Strong Volitional  Swallow: Able to elicit    Oral/Motor/Sensory Function Overall Oral Motor/Sensory Function: Within functional limits   Ice Chips Ice chips: Not tested   Thin Liquid Thin Liquid: Within functional limits Presentation: Self Fed;Straw    Nectar Thick Nectar Thick Liquid: Not tested   Honey Thick Honey Thick Liquid: Not tested   Puree Puree: Within functional limits Presentation: Self Fed;Spoon   Solid     Solid: Within functional limits Presentation: [place], [place], [place] Office Number: [place] 10/14/2021,10:29 AM

## 2021-10-14 NOTE — Progress Notes (Signed)
D/C order noted. Contacted [facility] to advise clinic of pt's d/c today and that pt will resume care tomorrow.   Melven Sartorius Renal Navigator [phone]

## 2021-10-14 NOTE — Discharge Summary (Signed)
Physician Discharge Summary  Dana Jackson QMV:[id number] DOB: 1945-06-18 DOA: 10/13/2021  PCP: Dana Bene, PA-C  Admit date: 10/13/2021 Discharge date: 10/14/2021  Time spent: 40 minutes  Recommendations for Outpatient Follow-up:  Follow outpatient CBC/CMP  Ensure cardiology follow up   Discharge Diagnoses:  Principal Problem:   NSTEMI (non-ST elevated myocardial infarction) [facility]) Active Problems:   Essential hypertension   S/P CABG x 3   Hyperlipidemia associated with type 2 diabetes mellitus ([place])   Type 2 diabetes mellitus with renal manifestations, controlled ([place])   Hyperkalemia   CAD (coronary artery disease) of artery bypass graft   ESRD on hemodialysis ([place])   Carcinoid tumor of left lung   Carcinoid tumor of right lung   Malignant neoplasm metastatic to both lungs ([place])   Acute respiratory failure with hypoxia ([place])   Discharge Condition: stable  Diet recommendation: heart healthy  Filed Weights   10/13/21 0022 10/13/21 0740 10/13/21 1250  Weight: 63.5 kg 64.5 kg 62.9 kg    History of present illness:   76 y.o. 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  SPO2 86-88% on room air, improved with 3 L O2.  Afebrile.  Labs showing WBC 13.9, hemoglobin 11.5 (stable), potassium 6.0, glucose 188, troponin 51> 1478, BNP 898, COVID and influenza PCR negative.  Chest x-ray showing no active disease.  Initial EKG showing new ST depressions in lateral leads which improved on subsequent EKGs.   Was diagnosed with NSTEMI and transferred [facility] [place] to [facility], she was seen by cardiology started on heparin drip, nephrology was consulted for  dialysis and she was admitted to the hospital.  Now s/p LHC.  Severe diffuse 3 vessel CAD without percutaneous or repeat surgical options.  Cardiology recommending medical management at this time.  Consider PCI of RCA if recurrent symptoms.  See below for additional details  Hospital Course:  Assessment and Plan: NSTEMI, patient with history of CAD s/p CABG around 10 years ago follows with Dr. Ellyn Hack cardiologist.  She presented with chest pain and troponin trend is suggestive of NSTEMI, EKG has ST depressions, she has been seen by the cardiology team.   Echo without WMA.    S/p heparin gtt S/p cardiac catheterization -> severe diffuse 3 vessel CAD without percutaneous or repeat surgical options due t segmental nature of disease and heavy calcification throughout native vessels -> recommended conservative medical management (if refractory symptoms, discuss revascularization) Seen by cardiology on day of discharge -> recommending medical therapy, PCI of RCA considered if recurrent sx -> aspirin, plavix, lipitor, isosorbide, toprol.  Will discharge today.   Hyperkalemia, in Dana Jackson patient with ESRD. improved today, s/p dialysis 10/3   Acute hypoxemic respiratory failure - resolved, appears euvolemic at this time.  Thought related to NSTEMI and ESRD?   Mild leukocytosis  - resolved   Anemia of chronic disease  - fluctuating Hb, follow outpatient   Hypertension  -Continue metoprolol and Imdur.   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 Left ventricular ejection fraction, by estimation, is 60 to 65%. The  left ventricle has normal function. The left ventricle has no regional  wall motion abnormalities. There is mild concentric left ventricular  hypertrophy. Left  ventricular diastolic  parameters are consistent with Grade II diastolic dysfunction  (pseudonormalization). Elevated left atrial pressure.   2. Right ventricular systolic function is normal. The right ventricular  size is normal. There is normal pulmonary artery systolic pressure. The  estimated right ventricular systolic pressure is 68.1 mmHg.   3. The mitral valve is normal in structure. Mild mitral valve  regurgitation. Moderate to severe mitral annular calcification.   4. The aortic valve is tricuspid. There is mild calcification of the  aortic valve. There is mild thickening of the aortic valve. Aortic valve  regurgitation is not visualized. Aortic valve sclerosis/calcification is  present, without any evidence of  aortic stenosis.   5. The inferior vena cava is normal in size with greater than 50%  respiratory variability, suggesting right atrial pressure of 3 mmHg.   LHC    CONCLUSIONS: New culprit total occlusion of SVG to the obtuse marginal Chronic total occlusion of SVG to PDA Patent LIMA to apical LAD which is totally occluded proximal to the graft insertion site and in the apical segment beyond the graft and insertion site. 70% distal left main.   70% ostial LAD and sequential 80% stenoses in the proximal and mid LAD Ostial 95 to 99% heavily calcified circumflex with severe diffuse disease in the dominant obtuse marginal. Moderate mid RCA disease and 70 to 80% distal disease.  95% stenosis in the PDA and 70% proximal LV branch disease. Preserved LV function with EF approximately 50 to 60% with EDP 12 mmHg.     RECOMMENDATIONS:   There are no good PCI or surgical options.  We will have other team members to look at pictures and give their opinion.  If refractory angina unresponsive to medical therapy we could treat the right coronary however it is the least severely diseased vessel of the 3 major epicardial coronaries.  I do not believe repeat surgery is an option at her  age and with end-stage kidney disease.    Consultations: Cardiology nephrology  Discharge Exam: Vitals:   10/14/21 0443 10/14/21 0804  BP: (!) 164/66 (!) 156/66  Pulse: 91 70  Resp: 20 18  Temp: 98.9 F (37.2 C) 98.7 F (37.1 C)  SpO2: 95% 96%   No CP Comfortable with discharge plan  General: No acute distress. Cardiovascular: Heart sounds show Iya Hamed regular rate, and rhythm. Lungs: unlabred Abdomen: Soft, nontender, nondistended Neurological: Alert and oriented 3. Moves all extremities 4. Cranial nerves II through XII grossly intact.  Slightly HOH. Extremities: No clubbing or cyanosis. No edema.   Discharge Instructions   Discharge Instructions     (HEART FAILURE PATIENTS) Call MD:  Anytime you have any of the following symptoms: 1) 3 pound weight gain in 24 hours or 5 pounds in 1 week 2) shortness of breath, with or without Dana Jackson dry hacking cough 3) swelling in the hands, feet or stomach 4) if you have to sleep on extra pillows at night in order to breathe.   Complete by: As directed    Call MD for:  difficulty breathing, headache or visual disturbances   Complete by: As directed    Call MD for:  extreme fatigue   Complete by: As directed    Call MD for:  hives   Complete by:  As directed    Call MD for:  persistant dizziness or light-headedness   Complete by: As directed    Call MD for:  persistant nausea and vomiting   Complete by: As directed    Call MD for:  redness, tenderness, or signs of infection (pain, swelling, redness, odor or green/yellow discharge around incision site)   Complete by: As directed    Call MD for:  severe uncontrolled pain   Complete by: As directed    Call MD for:  temperature >100.4   Complete by: As directed    Diet - low sodium heart healthy   Complete by: As directed    Discharge instructions   Complete by: As directed    You were seen for Dana Jackson heart attack (NSTEMI - non ST elevation myocardial infarction).  You had Dana Jackson cardiac  catheterization on 10/3.  Cardiology is recommending medical therapy at this point.  We've added plavix to your aspirin, atorvastatin, metoprolol, and imdur.  Please follow up with cardiology as an outpatient as scheduled.    Return for new, concerning, or recurrent symptoms.  Please schedule follow up with your PCP within 1 week.  Please ask your PCP to request records from this hospitalization so they know what was done and what the next steps will be.   Increase activity slowly   Complete by: As directed       Allergies as of 10/14/2021       Reactions   Sulfa Antibiotics Other (See Comments)   "AKI"   Erythromycin Itching   Pravastatin Other (See Comments)   Unknown reaction   Simvastatin Other (See Comments)   Reaction not known   Codeine Nausea Only, Other (See Comments)   Tolerate Hycodan   Gemfibrozil Itching        Medication List     TAKE these medications    acetaminophen 500 MG tablet Commonly known as: TYLENOL Take 1,000 mg by mouth every 6 (six) hours as needed for moderate pain.   Align 4 MG Caps Take 4 mg by mouth daily.   Alpha-Lipoic Acid 600 MG Caps Take 600 mg by mouth daily.   aspirin 81 MG tablet Take 81 mg by mouth at bedtime.   atorvastatin 80 MG tablet Commonly known as: LIPITOR Take 1 tablet (80 mg total) by mouth at bedtime.   benzonatate 100 MG capsule Commonly known as: TESSALON Take 100-200 mg by mouth 3 (three) times daily as needed for cough.   calcitRIOL 0.25 MCG capsule Commonly known as: ROCALTROL Take by mouth.   cinacalcet 30 MG tablet Commonly known as: SENSIPAR Take 30 mg by mouth daily with breakfast.   clopidogrel 75 MG tablet Commonly known as: PLAVIX Take 1 tablet (75 mg total) by mouth daily. Start taking on: October 15, 2021   cyanocobalamin 1000 MCG/ML injection Commonly known as: VITAMIN B12 Inject 1 mL (1,000 mcg total) into the muscle every 30 (thirty) days for 90 doses.   Darbepoetin Alfa 60  MCG/0.3ML Sosy injection Commonly known as: ARANESP Inject 0.3 mLs (60 mcg total) into the vein every Tuesday with hemodialysis.   estradiol 0.1 MG/GM vaginal cream Commonly known as: ESTRACE Place 1 Applicatorful vaginally 3 (three) times Frankey Botting week.   famotidine 20 MG tablet Commonly known as: PEPCID Take 1 tablet (20 mg total) by mouth at bedtime.   Fish Oil 1200 MG Caps Take 2,400 mg by mouth in the morning and at bedtime.   furosemide 20 MG tablet Commonly known  as: LASIX Take 20 mg by mouth 2 (two) times Sahand Gosch week. Tues and Saturday night   gabapentin 100 MG capsule Commonly known as: NEURONTIN Take 100 mg by mouth 2 (two) times daily.   iron sucrose in sodium chloride 0.9 % 100 mL Iron Sucrose (Venofer)   isosorbide mononitrate 60 MG 24 hr tablet Commonly known as: IMDUR TAKE 1 TABLET BY MOUTH EVERY DAY What changed: when to take this   lidocaine-prilocaine cream Commonly known as: EMLA Apply 1 application topically Every Tuesday,Thursday,and Saturday with dialysis.   loperamide 2 MG tablet Commonly known as: IMODIUM Kaitlyn Franko-D Take 4 mg by mouth as needed for diarrhea or loose stools.   loratadine 10 MG tablet Commonly known as: CLARITIN Take 10 mg by mouth daily.   magnesium oxide 400 MG tablet Commonly known as: MAG-OX Take 400 mg by mouth daily.   metoprolol tartrate 50 MG tablet Commonly known as: LOPRESSOR Take 1 tablet (50 mg total) by mouth 2 (two) times daily. schedule an appointment for further refills, 1st attempt   MIRCERA IJ Mircera   nitroGLYCERIN 0.4 MG SL tablet Commonly known as: NITROSTAT Place 1 tablet (0.4 mg total) under the tongue every 5 (five) minutes as needed for chest pain.   omeprazole 40 MG capsule Commonly known as: PRILOSEC Take 40 mg by mouth daily before breakfast.   promethazine-dextromethorphan 6.25-15 MG/5ML syrup Commonly known as: PROMETHAZINE-DM Take 5 mLs by mouth 4 (four) times daily as needed for cough.   pyridOXINE  100 MG tablet Commonly known as: VITAMIN B6 Take 200 mg by mouth daily.   sevelamer carbonate 800 MG tablet Commonly known as: RENVELA Take 800 mg by mouth See admin instructions. Take 800 mg with each meal and each snack       Allergies  Allergen Reactions   Sulfa Antibiotics Other (See Comments)    "AKI"    Erythromycin Itching   Pravastatin Other (See Comments)    Unknown reaction   Simvastatin Other (See Comments)    Reaction not known   Codeine Nausea Only and Other (See Comments)    Tolerate Hycodan   Gemfibrozil Itching      The results of significant diagnostics from this hospitalization (including imaging, microbiology, ancillary and laboratory) are listed below for reference.    Significant Diagnostic Studies: CARDIAC CATHETERIZATION  Result Date: 10/13/2021 CONCLUSIONS: New culprit total occlusion of SVG to the obtuse marginal Chronic total occlusion of SVG to PDA Patent LIMA to apical LAD which is totally occluded proximal to the graft insertion site and in the apical segment beyond the graft and insertion site. 70% distal left main.  70% ostial LAD and sequential 80% stenoses in the proximal and mid LAD Ostial 95 to 99% heavily calcified circumflex with severe diffuse disease in the dominant obtuse marginal. Moderate mid RCA disease and 70 to 80% distal disease.  95% stenosis in the PDA and 70% proximal LV branch disease. Preserved LV function with EF approximately 50 to 60% with EDP 12 mmHg. RECOMMENDATIONS: There are no good PCI or surgical options.  We will have other team members to look at pictures and give their opinion.  If refractory angina unresponsive to medical therapy we could treat the right coronary however it is the least severely diseased vessel of the 3 major epicardial coronaries.  I do not believe repeat surgery is an option at her age and with end-stage kidney disease.   ECHOCARDIOGRAM COMPLETE  Result Date: 10/13/2021    ECHOCARDIOGRAM REPORT  Patient Name:   Dana Jackson Date of Exam: 10/13/2021 Medical Rec #:  [id number]      Height:       60.0 in Accession #:    [id number]     Weight:       138.7 lb Date of Birth:  [birth year]     BSA:          1.598 m Patient Age:    36 years       BP:           156/63 mmHg Patient Gender: F              HR:           65 bpm. Exam Location:  Inpatient Procedure: 2D Echo Indications:    NSTEMI  History:        Patient has prior history of Echocardiogram examinations, most                 recent 03/18/2019. CAD; Risk Factors:Hypertension, Diabetes and                 Dyslipidemia.  Sonographer:    Harvie Junior Referring Phys: [id number] VASUNDHRA RATHORE  Sonographer Comments: Technically difficult study due to poor echo windows and patient is obese. Image acquisition challenging due to patient body habitus. IMPRESSIONS  1. Left ventricular ejection fraction, by estimation, is 60 to 65%. The left ventricle has normal function. The left ventricle has no regional wall motion abnormalities. There is mild concentric left ventricular hypertrophy. Left ventricular diastolic parameters are consistent with Grade II diastolic dysfunction (pseudonormalization). Elevated left atrial pressure.  2. Right ventricular systolic function is normal. The right ventricular size is normal. There is normal pulmonary artery systolic pressure. The estimated right ventricular systolic pressure is 60.6 mmHg.  3. The mitral valve is normal in structure. Mild mitral valve regurgitation. Moderate to severe mitral annular calcification.  4. The aortic valve is tricuspid. There is mild calcification of the aortic valve. There is mild thickening of the aortic valve. Aortic valve regurgitation is not visualized. Aortic valve sclerosis/calcification is present, without any evidence of aortic stenosis.  5. The inferior vena cava is normal in size with greater than 50% respiratory variability, suggesting right atrial pressure of 3 mmHg. FINDINGS  Left  Ventricle: Left ventricular ejection fraction, by estimation, is 60 to 65%. The left ventricle has normal function. The left ventricle has no regional wall motion abnormalities. The left ventricular internal cavity size was normal in size. There is  mild concentric left ventricular hypertrophy. Left ventricular diastolic parameters are consistent with Grade II diastolic dysfunction (pseudonormalization). Elevated left atrial pressure. Right Ventricle: The right ventricular size is normal. No increase in right ventricular wall thickness. Right ventricular systolic function is normal. There is normal pulmonary artery systolic pressure. The tricuspid regurgitant velocity is 2.39 m/s, and  with an assumed right atrial pressure of 3 mmHg, the estimated right ventricular systolic pressure is 30.1 mmHg. Left Atrium: Left atrial size was normal in size. Right Atrium: Right atrial size was normal in size. Pericardium: Trivial pericardial effusion is present. Mitral Valve: The mitral valve is normal in structure. Moderate to severe mitral annular calcification. Mild mitral valve regurgitation. Tricuspid Valve: The tricuspid valve is normal in structure. Tricuspid valve regurgitation is trivial. Aortic Valve: The aortic valve is tricuspid. There is mild calcification of the aortic valve. There is mild thickening of the aortic valve. Aortic valve regurgitation is not visualized. Aortic valve sclerosis/calcification is  present, without any evidence of aortic stenosis. Aortic valve mean gradient measures 4.0 mmHg. Aortic valve peak gradient measures 7.7 mmHg. Aortic valve area, by VTI measures 2.47 cm. Pulmonic Valve: The pulmonic valve was normal in structure. Pulmonic valve regurgitation is trivial. Aorta: The aortic root is normal in size and structure. Venous: The inferior vena cava is normal in size with greater than 50% respiratory variability, suggesting right atrial pressure of 3 mmHg. IAS/Shunts: No atrial level shunt  detected by color flow Doppler.  LEFT VENTRICLE PLAX 2D LVIDd:         3.90 cm     Diastology LVIDs:         2.50 cm     LV e' medial:    6.68 cm/s LV PW:         1.20 cm     LV E/e' medial:  19.0 LV IVS:        1.20 cm     LV e' lateral:   7.77 cm/s LVOT diam:     1.80 cm     LV E/e' lateral: 16.3 LV SV:         79 LV SV Index:   49 LVOT Area:     2.54 cm  LV Volumes (MOD) LV vol d, MOD A2C: 69.5 ml LV vol d, MOD A4C: 79.3 ml LV vol s, MOD A2C: 20.5 ml LV vol s, MOD A4C: 34.5 ml LV SV MOD A2C:     49.0 ml LV SV MOD A4C:     79.3 ml LV SV MOD BP:      50.3 ml RIGHT VENTRICLE RV Basal diam:  3.10 cm RV Mid diam:    2.70 cm RV S prime:     7.70 cm/s TAPSE (M-mode): 1.6 cm LEFT ATRIUM             Index        RIGHT ATRIUM           Index LA diam:        3.00 cm 1.88 cm/m   RA Area:     12.60 cm LA Vol (A2C):   33.2 ml 20.78 ml/m  RA Volume:   27.20 ml  17.03 ml/m LA Vol (A4C):   44.4 ml 27.79 ml/m LA Biplane Vol: 39.8 ml 24.91 ml/m  AORTIC VALVE                     PULMONIC VALVE AV Area (Vmax):    2.22 cm      PV Vmax:          0.98 m/s AV Area (Vmean):   2.24 cm      PV Peak grad:     3.8 mmHg AV Area (VTI):     2.47 cm      PR End Diast Vel: 4.84 msec AV Vmax:           139.00 cm/s AV Vmean:          100.000 cm/s AV VTI:            0.320 m AV Peak Grad:      7.7 mmHg AV Mean Grad:      4.0 mmHg LVOT Vmax:         121.00 cm/s LVOT Vmean:        88.200 cm/s LVOT VTI:          0.310 m LVOT/AV VTI ratio: 0.97  AORTA Ao Root diam: 2.90 cm  Ao Asc diam:  3.20 cm MITRAL VALVE                TRICUSPID VALVE MV Area (PHT): 2.55 cm     TR Peak grad:   22.8 mmHg MV Decel Time: 298 msec     TR Vmax:        239.00 cm/s MR Peak grad: 67.6 mmHg MR Vmax:      411.00 cm/s   SHUNTS MV E velocity: 127.00 cm/s  Systemic VTI:  0.31 m MV Deontez Klinke velocity: 135.00 cm/s  Systemic Diam: 1.80 cm MV E/Jabez Molner ratio:  0.94 Mihai Croitoru MD Electronically signed by Sanda Klein MD Signature Date/Time: 10/13/2021/2:54:11 PM    Final    DG Chest  Port 1 View  Result Date: 10/13/2021 CLINICAL DATA:  Chest pain and recent pneumonia EXAM: PORTABLE CHEST 1 VIEW COMPARISON:  09/29/2021 FINDINGS: The heart size and mediastinal contours are within normal limits. Both lungs are clear. The visualized skeletal structures are unremarkable. Remote median sternotomy IMPRESSION: No active disease. Electronically Signed   By: Ulyses Jarred M.D.   On: 10/13/2021 00:57   DG Chest 2 View  Result Date: 09/30/2021 CLINICAL DATA:  Acute cough. EXAM: CHEST - 2 VIEW COMPARISON:  February 03, 2021 FINDINGS: The heart size and mediastinal contours are stable. Mild opacity of medial left lung base is noted. There is probable mild atelectasis of medial right lung base. There is no pleural effusion or pulmonary edema. The visualized skeletal structures are stable. IMPRESSION: Mild opacity of medial left lung base, developing pneumonia is not excluded. Electronically Signed   By: Abelardo Diesel M.D.   On: 09/30/2021 11:53    Microbiology: Recent Results (from the past 240 hour(s))  Resp Panel by RT-PCR (Flu Helios Kohlmann&B, Covid) Anterior Nasal Swab     Status: None   Collection Time: 10/13/21  1:58 AM   Specimen: Anterior Nasal Swab  Result Value Ref Range Status   SARS Coronavirus 2 by RT PCR NEGATIVE NEGATIVE Final    Comment: (NOTE) SARS-CoV-2 target nucleic acids are NOT DETECTED.  The SARS-CoV-2 RNA is generally detectable in upper respiratory specimens during the acute phase of infection. The lowest concentration of SARS-CoV-2 viral copies this assay can detect is 138 copies/mL. Haskel Dewalt negative result does not preclude SARS-Cov-2 infection and should not be used as the sole basis for treatment or other patient management decisions. Coty Larsh negative result may occur with  improper specimen collection/handling, submission of specimen other than nasopharyngeal swab, presence of viral mutation(s) within the areas targeted by this assay, and inadequate number of viral copies(<138  copies/mL). Gean Larose negative result must be combined with clinical observations, patient history, and epidemiological information. The expected result is Negative.  Fact Sheet for Patients:  EntrepreneurPulse.com.au  Fact Sheet for Healthcare Providers:  IncredibleEmployment.be  This test is no t yet approved or cleared by the [place] FDA and  has been authorized for detection and/or diagnosis of SARS-CoV-2 by FDA under an Emergency Use Authorization (EUA). This EUA will remain  in effect (meaning this test can be used) for the duration of the COVID-19 declaration under Section 564(b)(1) of the Act, 21 U.S.C.section 360bbb-3(b)(1), unless the authorization is terminated  or revoked sooner.       Influenza Quyen Cutsforth by PCR NEGATIVE NEGATIVE Final   Influenza B by PCR NEGATIVE NEGATIVE Final    Comment: (NOTE) The Xpert Xpress SARS-CoV-2/FLU/RSV plus assay is intended as an aid in the diagnosis of influenza from Nasopharyngeal swab  specimens and should not be used as Kang Ishida sole basis for treatment. Nasal washings and aspirates are unacceptable for Xpert Xpress SARS-CoV-2/FLU/RSV testing.  Fact Sheet for Patients: EntrepreneurPulse.com.au  Fact Sheet for Healthcare Providers: IncredibleEmployment.be  This test is not yet approved or cleared by the [place] FDA and has been authorized for detection and/or diagnosis of SARS-CoV-2 by FDA under an Emergency Use Authorization (EUA). This EUA will remain in effect (meaning this test can be used) for the duration of the COVID-19 declaration under Section 564(b)(1) of the Act, 21 U.S.C. section 360bbb-3(b)(1), unless the authorization is terminated or revoked.  Performed at [facility], [address] [place], [place] [address]      Labs: Basic Metabolic Panel: Recent Labs  Lab 10/13/21 0026 10/13/21 0500 10/14/21 0216  NA 136  --  137  K  6.0*  --  4.0  CL 98  --  94*  CO2 27  --  31  GLUCOSE 188*  --  105*  BUN 57*  --  27*  CREATININE 4.71* 4.84* 3.24*  CALCIUM 8.7*  --  8.9  MG  --  2.8* 2.1   Liver Function Tests: No results for input(s): "AST", "ALT", "ALKPHOS", "BILITOT", "PROT", "ALBUMIN" in the last 168 hours. No results for input(s): "LIPASE", "AMYLASE" in the last 168 hours. No results for input(s): "AMMONIA" in the last 168 hours. CBC: Recent Labs  Lab 10/13/21 0026 10/14/21 0216  WBC 13.9* 4.8  HGB 11.5* 9.7*  HCT 33.5* 28.4*  MCV 108.1* 106.4*  PLT 188 156   Cardiac Enzymes: No results for input(s): "CKTOTAL", "CKMB", "CKMBINDEX", "TROPONINI" in the last 168 hours. BNP: BNP (last 3 results) Recent Labs    10/13/21 0027 10/14/21 0216  BNP 898.8* 1,267.0*    ProBNP (last 3 results) No results for input(s): "PROBNP" in the last 8760 hours.  CBG: Recent Labs  Lab 10/13/21 1817 10/13/21 2141 10/14/21 0033 10/14/21 0441 10/14/21 0801  GLUCAP 90 210* 121* 102* 113*       Signed:  Fayrene Helper MD.  [facility] 10/14/2021, 11:04 AM

## 2021-10-14 NOTE — Care Management CC44 (Signed)
Condition Code 44 Documentation Completed  Patient Details  Name: Dana Jackson MRN: [id number] Date of Birth: 09-20-1945   Condition Code 44 given:  Yes Patient signature on Condition Code 44 notice:  Yes Documentation of 2 MD's agreement:  Yes Code 44 added to claim:  Yes    Bethena Roys, RN 10/14/2021, 11:41 AM

## 2021-10-15 ENCOUNTER — Telehealth: Payer: Self-pay | Admitting: Cardiology

## 2021-10-15 ENCOUNTER — Other Ambulatory Visit: Payer: Self-pay | Admitting: Cardiology

## 2021-10-15 LAB — LIPOPROTEIN A (LPA): Lipoprotein (a): 160.7 nmol/L — ABNORMAL HIGH (ref <75.0)

## 2021-10-15 MED ORDER — NITROGLYCERIN 0.4 MG SL SUBL: SUBLINGUAL_TABLET | SUBLINGUAL | 11 refills | Status: DC

## 2021-10-15 NOTE — Telephone Encounter (Signed)
*  STAT* If patient is at the pharmacy, call can be transferred to refill team.   1. Which medications need to be refilled? (please list name of each medication and dose if known) nitroGLYCERIN (NITROSTAT) 0.4 MG SL tablet  2. Which pharmacy/location (including street and city if local pharmacy) is medication to be sent to? [id number] [place],  - [address]  3. Do they need a 30 day or 90 day supply? [place]

## 2021-10-16 NOTE — TOC Transition Note (Signed)
Transition of care contact from inpatient facility  Date of discharge: 10/14/21 Date of contact: 10/16/21 Method: Phone Spoke to: Patient  Patient contacted to discuss transition of care from recent inpatient hospitalization. Patient was admitted to [facility] from 10/13/21-10/14/21 with discharge diagnosis of NSTEMI s/p heart catheterization.  Medication changes were reviewed.  Patient received HD on 10/15/21 at [facility]. Next HD on 10/17/21.  Tobie Poet, NP

## 2021-10-19 ENCOUNTER — Inpatient Hospital Stay (HOSPITAL_COMMUNITY)
Admission: EM | Admit: 2021-10-19 | Discharge: 2021-10-23 | DRG: 871 | Disposition: A | Discharge status: Home Health | Payer: Medicare PPO | Attending: Internal Medicine | Admitting: Internal Medicine

## 2021-10-19 ENCOUNTER — Emergency Department (HOSPITAL_COMMUNITY): Payer: Medicare PPO

## 2021-10-19 ENCOUNTER — Other Ambulatory Visit: Payer: Self-pay

## 2021-10-19 DIAGNOSIS — H9190 Unspecified hearing loss, unspecified ear: Secondary | ICD-10-CM | POA: Diagnosis present

## 2021-10-19 DIAGNOSIS — D539 Nutritional anemia, unspecified: Secondary | ICD-10-CM | POA: Diagnosis present

## 2021-10-19 DIAGNOSIS — D3A090 Benign carcinoid tumor of the bronchus and lung: Secondary | ICD-10-CM | POA: Diagnosis present

## 2021-10-19 DIAGNOSIS — J45909 Unspecified asthma, uncomplicated: Secondary | ICD-10-CM | POA: Diagnosis present

## 2021-10-19 DIAGNOSIS — I083 Combined rheumatic disorders of mitral, aortic and tricuspid valves: Secondary | ICD-10-CM | POA: Diagnosis present

## 2021-10-19 DIAGNOSIS — J9601 Acute respiratory failure with hypoxia: Secondary | ICD-10-CM | POA: Diagnosis present

## 2021-10-19 DIAGNOSIS — I5032 Chronic diastolic (congestive) heart failure: Secondary | ICD-10-CM | POA: Diagnosis present

## 2021-10-19 DIAGNOSIS — Z992 Dependence on renal dialysis: Secondary | ICD-10-CM | POA: Diagnosis not present

## 2021-10-19 DIAGNOSIS — Z85528 Personal history of other malignant neoplasm of kidney: Secondary | ICD-10-CM

## 2021-10-19 DIAGNOSIS — I132 Hypertensive heart and chronic kidney disease with heart failure and with stage 5 chronic kidney disease, or end stage renal disease: Secondary | ICD-10-CM | POA: Diagnosis present

## 2021-10-19 DIAGNOSIS — K219 Gastro-esophageal reflux disease without esophagitis: Secondary | ICD-10-CM | POA: Diagnosis present

## 2021-10-19 DIAGNOSIS — E871 Hypo-osmolality and hyponatremia: Secondary | ICD-10-CM | POA: Diagnosis not present

## 2021-10-19 DIAGNOSIS — E785 Hyperlipidemia, unspecified: Secondary | ICD-10-CM | POA: Diagnosis present

## 2021-10-19 DIAGNOSIS — J189 Pneumonia, unspecified organism: Secondary | ICD-10-CM

## 2021-10-19 DIAGNOSIS — Z823 Family history of stroke: Secondary | ICD-10-CM

## 2021-10-19 DIAGNOSIS — R0902 Hypoxemia: Secondary | ICD-10-CM

## 2021-10-19 DIAGNOSIS — R6521 Severe sepsis with septic shock: Secondary | ICD-10-CM | POA: Diagnosis present

## 2021-10-19 DIAGNOSIS — E1140 Type 2 diabetes mellitus with diabetic neuropathy, unspecified: Secondary | ICD-10-CM | POA: Diagnosis present

## 2021-10-19 DIAGNOSIS — E1122 Type 2 diabetes mellitus with diabetic chronic kidney disease: Secondary | ICD-10-CM | POA: Diagnosis present

## 2021-10-19 DIAGNOSIS — I252 Old myocardial infarction: Secondary | ICD-10-CM | POA: Diagnosis not present

## 2021-10-19 DIAGNOSIS — Z79899 Other long term (current) drug therapy: Secondary | ICD-10-CM

## 2021-10-19 DIAGNOSIS — A419 Sepsis, unspecified organism: Secondary | ICD-10-CM | POA: Diagnosis present

## 2021-10-19 DIAGNOSIS — Z90710 Acquired absence of both cervix and uterus: Secondary | ICD-10-CM

## 2021-10-19 DIAGNOSIS — Z882 Allergy status to sulfonamides status: Secondary | ICD-10-CM

## 2021-10-19 DIAGNOSIS — R8271 Bacteriuria: Secondary | ICD-10-CM | POA: Diagnosis present

## 2021-10-19 DIAGNOSIS — Z9221 Personal history of antineoplastic chemotherapy: Secondary | ICD-10-CM

## 2021-10-19 DIAGNOSIS — Z8542 Personal history of malignant neoplasm of other parts of uterus: Secondary | ICD-10-CM

## 2021-10-19 DIAGNOSIS — N186 End stage renal disease: Secondary | ICD-10-CM | POA: Diagnosis present

## 2021-10-19 DIAGNOSIS — D631 Anemia in chronic kidney disease: Secondary | ICD-10-CM | POA: Diagnosis present

## 2021-10-19 DIAGNOSIS — Z8616 Personal history of COVID-19: Secondary | ICD-10-CM

## 2021-10-19 DIAGNOSIS — Z923 Personal history of irradiation: Secondary | ICD-10-CM

## 2021-10-19 DIAGNOSIS — R509 Fever, unspecified: Principal | ICD-10-CM

## 2021-10-19 DIAGNOSIS — Z1152 Encounter for screening for COVID-19: Secondary | ICD-10-CM

## 2021-10-19 DIAGNOSIS — Y95 Nosocomial condition: Secondary | ICD-10-CM | POA: Diagnosis present

## 2021-10-19 DIAGNOSIS — Z7982 Long term (current) use of aspirin: Secondary | ICD-10-CM

## 2021-10-19 DIAGNOSIS — Z8249 Family history of ischemic heart disease and other diseases of the circulatory system: Secondary | ICD-10-CM

## 2021-10-19 DIAGNOSIS — Z7902 Long term (current) use of antithrombotics/antiplatelets: Secondary | ICD-10-CM

## 2021-10-19 DIAGNOSIS — N390 Urinary tract infection, site not specified: Secondary | ICD-10-CM

## 2021-10-19 DIAGNOSIS — Z807 Family history of other malignant neoplasms of lymphoid, hematopoietic and related tissues: Secondary | ICD-10-CM

## 2021-10-19 DIAGNOSIS — Z8673 Personal history of transient ischemic attack (TIA), and cerebral infarction without residual deficits: Secondary | ICD-10-CM

## 2021-10-19 DIAGNOSIS — Z881 Allergy status to other antibiotic agents status: Secondary | ICD-10-CM

## 2021-10-19 DIAGNOSIS — I2510 Atherosclerotic heart disease of native coronary artery without angina pectoris: Secondary | ICD-10-CM | POA: Diagnosis present

## 2021-10-19 DIAGNOSIS — Z888 Allergy status to other drugs, medicaments and biological substances status: Secondary | ICD-10-CM

## 2021-10-19 DIAGNOSIS — Z905 Acquired absence of kidney: Secondary | ICD-10-CM

## 2021-10-19 LAB — CBC WITH DIFFERENTIAL/PLATELET
Abs Immature Granulocytes: 0 10*3/uL (ref 0.00–0.07)
Basophils Absolute: 0 10*3/uL (ref 0.0–0.1)
Basophils Relative: 0 %
Eosinophils Absolute: 0.3 10*3/uL (ref 0.0–0.5)
Eosinophils Relative: 5 %
HCT: 31.5 % — ABNORMAL LOW (ref 36.0–46.0)
Hemoglobin: 10.8 g/dL — ABNORMAL LOW (ref 12.0–15.0)
Lymphocytes Relative: 4 %
Lymphs Abs: 0.2 10*3/uL — ABNORMAL LOW (ref 0.7–4.0)
MCH: 36.2 pg — ABNORMAL HIGH (ref 26.0–34.0)
MCHC: 34.3 g/dL (ref 30.0–36.0)
MCV: 105.7 fL — ABNORMAL HIGH (ref 80.0–100.0)
Monocytes Absolute: 0 10*3/uL — ABNORMAL LOW (ref 0.1–1.0)
Monocytes Relative: 0 %
Neutro Abs: 4.8 10*3/uL (ref 1.7–7.7)
Neutrophils Relative %: 91 %
Platelets: 159 10*3/uL (ref 150–400)
RBC: 2.98 MIL/uL — ABNORMAL LOW (ref 3.87–5.11)
RDW: 14.7 % (ref 11.5–15.5)
WBC: 5.3 10*3/uL (ref 4.0–10.5)
nRBC: 0 % (ref 0.0–0.2)
nRBC: 0 /100 WBC

## 2021-10-19 LAB — I-STAT CHEM 8, ED
BUN: 33 mg/dL — ABNORMAL HIGH (ref 8–23)
BUN: 34 mg/dL — ABNORMAL HIGH (ref 8–23)
Calcium, Ion: 1.09 mmol/L — ABNORMAL LOW (ref 1.15–1.40)
Calcium, Ion: 1.1 mmol/L — ABNORMAL LOW (ref 1.15–1.40)
Chloride: 96 mmol/L — ABNORMAL LOW (ref 98–111)
Chloride: 96 mmol/L — ABNORMAL LOW (ref 98–111)
Creatinine, Ser: 4.9 mg/dL — ABNORMAL HIGH (ref 0.44–1.00)
Creatinine, Ser: 4.9 mg/dL — ABNORMAL HIGH (ref 0.44–1.00)
Glucose, Bld: 112 mg/dL — ABNORMAL HIGH (ref 70–99)
Glucose, Bld: 107 mg/dL — ABNORMAL HIGH (ref 70–99)
HCT: 32 % — ABNORMAL LOW (ref 36.0–46.0)
HCT: 30 % — ABNORMAL LOW (ref 36.0–46.0)
Hemoglobin: 10.9 g/dL — ABNORMAL LOW (ref 12.0–15.0)
Hemoglobin: 10.2 g/dL — ABNORMAL LOW (ref 12.0–15.0)
Potassium: 3.9 mmol/L (ref 3.5–5.1)
Potassium: 3.8 mmol/L (ref 3.5–5.1)
Sodium: 136 mmol/L (ref 135–145)
Sodium: 135 mmol/L (ref 135–145)
TCO2: 28 mmol/L (ref 22–32)
TCO2: 29 mmol/L (ref 22–32)

## 2021-10-19 LAB — I-STAT VENOUS BLOOD GAS, ED
Acid-Base Excess: 3 mmol/L — ABNORMAL HIGH (ref 0.0–2.0)
Acid-base deficit: 1 mmol/L (ref 0.0–2.0)
Acid-base deficit: 2 mmol/L (ref 0.0–2.0)
Bicarbonate: 29.3 mmol/L — ABNORMAL HIGH (ref 20.0–28.0)
Bicarbonate: 23.5 mmol/L (ref 20.0–28.0)
Bicarbonate: 23 mmol/L (ref 20.0–28.0)
Calcium, Ion: 1.1 mmol/L — ABNORMAL LOW (ref 1.15–1.40)
Calcium, Ion: 0.97 mmol/L — ABNORMAL LOW (ref 1.15–1.40)
Calcium, Ion: 0.97 mmol/L — ABNORMAL LOW (ref 1.15–1.40)
HCT: 30 % — ABNORMAL LOW (ref 36.0–46.0)
HCT: 27 % — ABNORMAL LOW (ref 36.0–46.0)
HCT: 36 % (ref 36.0–46.0)
Hemoglobin: 10.2 g/dL — ABNORMAL LOW (ref 12.0–15.0)
Hemoglobin: 9.2 g/dL — ABNORMAL LOW (ref 12.0–15.0)
Hemoglobin: 12.2 g/dL (ref 12.0–15.0)
O2 Saturation: 96 %
O2 Saturation: 78 %
O2 Saturation: 80 %
Potassium: 3.9 mmol/L (ref 3.5–5.1)
Potassium: 3.8 mmol/L (ref 3.5–5.1)
Potassium: 3.8 mmol/L (ref 3.5–5.1)
Sodium: 134 mmol/L — ABNORMAL LOW (ref 135–145)
Sodium: 132 mmol/L — ABNORMAL LOW (ref 135–145)
Sodium: 132 mmol/L — ABNORMAL LOW (ref 135–145)
TCO2: 31 mmol/L (ref 22–32)
TCO2: 25 mmol/L (ref 22–32)
TCO2: 24 mmol/L (ref 22–32)
pCO2, Ven: 53.7 mmHg (ref 44–60)
pCO2, Ven: 38.7 mmHg — ABNORMAL LOW (ref 44–60)
pCO2, Ven: 39.2 mmHg — ABNORMAL LOW (ref 44–60)
pH, Ven: 7.344 (ref 7.25–7.43)
pH, Ven: 7.391 (ref 7.25–7.43)
pH, Ven: 7.377 (ref 7.25–7.43)
pO2, Ven: 92 mmHg — ABNORMAL HIGH (ref 32–45)
pO2, Ven: 42 mmHg (ref 32–45)
pO2, Ven: 45 mmHg (ref 32–45)

## 2021-10-19 LAB — URINALYSIS, ROUTINE W REFLEX MICROSCOPIC
Bilirubin Urine: NEGATIVE
Glucose, UA: NEGATIVE mg/dL
Ketones, ur: NEGATIVE mg/dL
Nitrite: NEGATIVE
Protein, ur: 100 mg/dL — AB
Specific Gravity, Urine: 1.009 (ref 1.005–1.030)
WBC, UA: >50 WBC/hpf — ABNORMAL HIGH (ref 0–5)
pH: 8 (ref 5.0–8.0)

## 2021-10-19 LAB — COMPREHENSIVE METABOLIC PANEL
ALT: 28 U/L (ref 0–44)
AST: 33 U/L (ref 15–41)
Albumin: 2.7 g/dL — ABNORMAL LOW (ref 3.5–5.0)
Alkaline Phosphatase: 101 U/L (ref 38–126)
Anion gap: 10 (ref 5–15)
BUN: 33 mg/dL — ABNORMAL HIGH (ref 8–23)
CO2: 28 mmol/L (ref 22–32)
Calcium: 8.4 mg/dL — ABNORMAL LOW (ref 8.9–10.3)
Chloride: 97 mmol/L — ABNORMAL LOW (ref 98–111)
Creatinine, Ser: 4.77 mg/dL — ABNORMAL HIGH (ref 0.44–1.00)
GFR, Estimated: 9 mL/min — ABNORMAL LOW (ref >60)
Glucose, Bld: 114 mg/dL — ABNORMAL HIGH (ref 70–99)
Potassium: 3.8 mmol/L (ref 3.5–5.1)
Sodium: 135 mmol/L (ref 135–145)
Total Bilirubin: 0.7 mg/dL (ref 0.3–1.2)
Total Protein: 5.8 g/dL — ABNORMAL LOW (ref 6.5–8.1)

## 2021-10-19 LAB — TROPONIN I (HIGH SENSITIVITY)
Troponin I (High Sensitivity): 202 ng/L (ref <18)
Troponin I (High Sensitivity): 275 ng/L (ref <18)

## 2021-10-19 LAB — MRSA NEXT GEN BY PCR, NASAL: MRSA by PCR Next Gen: NOT DETECTED

## 2021-10-19 LAB — BRAIN NATRIURETIC PEPTIDE: B Natriuretic Peptide: 639.1 pg/mL — ABNORMAL HIGH (ref 0.0–100.0)

## 2021-10-19 LAB — D-DIMER, QUANTITATIVE: D-Dimer, Quant: 6.04 ug/mL-FEU — ABNORMAL HIGH (ref 0.00–0.50)

## 2021-10-19 LAB — GLUCOSE, CAPILLARY: Glucose-Capillary: 119 mg/dL — ABNORMAL HIGH (ref 70–99)

## 2021-10-19 LAB — PROCALCITONIN: Procalcitonin: 4.42 ng/mL

## 2021-10-19 LAB — LACTIC ACID, PLASMA
Lactic Acid, Venous: 2.4 mmol/L (ref 0.5–1.9)
Lactic Acid, Venous: 1.2 mmol/L (ref 0.5–1.9)
Lactic Acid, Venous: 1.2 mmol/L (ref 0.5–1.9)

## 2021-10-19 LAB — PROTIME-INR
INR: 1 (ref 0.8–1.2)
Prothrombin Time: 13.4 seconds (ref 11.4–15.2)

## 2021-10-19 LAB — STREP PNEUMONIAE URINARY ANTIGEN: Strep Pneumo Urinary Antigen: NEGATIVE

## 2021-10-19 LAB — SARS CORONAVIRUS 2 BY RT PCR: SARS Coronavirus 2 by RT PCR: NEGATIVE

## 2021-10-19 LAB — APTT: aPTT: 30 seconds (ref 24–36)

## 2021-10-19 MED ORDER — VANCOMYCIN VARIABLE DOSE PER UNSTABLE RENAL FUNCTION (PHARMACIST DOSING): Status: DC

## 2021-10-19 MED ORDER — NOREPINEPHRINE 4 MG/250ML-% IV SOLN
0.0000 ug/min | INTRAVENOUS | Status: DC
  Administered 2021-10-19: 2 ug/min via INTRAVENOUS
  Filled 2021-10-19: qty 250

## 2021-10-19 MED ORDER — ACETAMINOPHEN 325 MG PO TABS
650.0000 mg | ORAL_TABLET | Freq: Once | ORAL | Status: DC
  Filled 2021-10-19: qty 2

## 2021-10-19 MED ORDER — ASPIRIN 81 MG PO TABS: 81.0000 mg | ORAL_TABLET | Freq: Every day | ORAL | Status: DC

## 2021-10-19 MED ORDER — LACTATED RINGERS IV BOLUS
500.0000 mL | Freq: Once | INTRAVENOUS | Status: AC
  Administered 2021-10-19: 500 mL via INTRAVENOUS

## 2021-10-19 MED ORDER — VANCOMYCIN HCL 1250 MG/250ML IV SOLN
1250.0000 mg | Freq: Once | INTRAVENOUS | Status: AC
  Administered 2021-10-19: 1750 mg via INTRAVENOUS
  Filled 2021-10-19: qty 250

## 2021-10-19 MED ORDER — ASPIRIN 81 MG PO TBEC
81.0000 mg | DELAYED_RELEASE_TABLET | Freq: Every day | ORAL | Status: DC
  Administered 2021-10-19 – 2021-10-23 (×5): 81 mg via ORAL
  Filled 2021-10-19 (×5): qty 1

## 2021-10-19 MED ORDER — IOHEXOL 350 MG/ML SOLN
80.0000 mL | Freq: Once | INTRAVENOUS | Status: AC | PRN
  Administered 2021-10-19: 80 mL via INTRAVENOUS

## 2021-10-19 MED ORDER — PANTOPRAZOLE SODIUM 40 MG PO TBEC
40.0000 mg | DELAYED_RELEASE_TABLET | Freq: Every day | ORAL | Status: DC
  Administered 2021-10-19 – 2021-10-23 (×4): 40 mg via ORAL
  Filled 2021-10-19 (×5): qty 1

## 2021-10-19 MED ORDER — ACETAMINOPHEN 325 MG PO TABS
650.0000 mg | ORAL_TABLET | Freq: Once | ORAL | Status: AC
  Administered 2021-10-19: 650 mg via ORAL
  Filled 2021-10-19: qty 2

## 2021-10-19 MED ORDER — SODIUM CHLORIDE 0.9 % BOLUS PEDS
500.0000 mL | Freq: Once | INTRAVENOUS | Status: AC
  Administered 2021-10-19: 500 mL via INTRAVENOUS

## 2021-10-19 MED ORDER — PIPERACILLIN-TAZOBACTAM IN DEX 2-0.25 GM/50ML IV SOLN
2.2500 g | Freq: Three times a day (TID) | INTRAVENOUS | Status: DC
  Administered 2021-10-19 – 2021-10-23 (×9): 2.25 g via INTRAVENOUS
  Filled 2021-10-19 (×14): qty 50

## 2021-10-19 MED ORDER — CLOPIDOGREL BISULFATE 75 MG PO TABS
75.0000 mg | ORAL_TABLET | Freq: Every day | ORAL | Status: DC
  Administered 2021-10-19 – 2021-10-23 (×5): 75 mg via ORAL
  Filled 2021-10-19 (×5): qty 1

## 2021-10-19 MED ORDER — ATORVASTATIN CALCIUM 80 MG PO TABS
80.0000 mg | ORAL_TABLET | Freq: Every day | ORAL | Status: DC
  Administered 2021-10-19 – 2021-10-22 (×4): 80 mg via ORAL
  Filled 2021-10-19 (×2): qty 1
  Filled 2021-10-19: qty 2
  Filled 2021-10-19: qty 1

## 2021-10-19 MED ORDER — HEPARIN SODIUM (PORCINE) 5000 UNIT/ML IJ SOLN
5000.0000 [IU] | Freq: Three times a day (TID) | INTRAMUSCULAR | Status: DC
  Administered 2021-10-19 – 2021-10-21 (×5): 5000 [IU] via SUBCUTANEOUS
  Filled 2021-10-19 (×6): qty 1

## 2021-10-19 MED ORDER — SODIUM CHLORIDE 0.9 % IV SOLN
2.0000 g | Freq: Once | INTRAVENOUS | Status: AC
  Administered 2021-10-19: 2 g via INTRAVENOUS
  Filled 2021-10-19: qty 12.5

## 2021-10-19 MED ORDER — POLYETHYLENE GLYCOL 3350 17 G PO PACK: 17.0000 g | PACK | Freq: Every day | ORAL | Status: DC | PRN

## 2021-10-19 NOTE — Progress Notes (Signed)
An USGPIV (ultrasound guided PIV) has been placed for short-term vasopressor infusion. A correctly placed ivWatch must be used when administering Vasopressors. Should this treatment be needed beyond 72 hours, central line access should be obtained.  It will be the responsibility of the bedside nurse to follow best practice to prevent extravasations.   ?

## 2021-10-19 NOTE — Progress Notes (Signed)
Pharmacy Antibiotic Note  Dana Jackson is a 76 y.o. female admitted on 10/19/2021 with pneumonia.  Pharmacy has been consulted for vancomycin and zosyn dosing. [place] with recent admission, discharged on 10/4. Presented to ED with CC of shortness of breath and dyspnea on exertion. Patient was saturating 58% on room air. Patient febrile to 104.3, WBC=5.3.   Patient with ESRD, normal HD schedule T,T,S. Patient reports no missed sessions.  Patient received Vancomycin load and Cefepime earlier today in the ED.  Plan: Zosyn 2.25gm IV q8h No Vancomycin needed at this time.  Further doses based on HD sessions.  MRSA PCR ordered.  Follow culture data for de-escalation.  Monitor renal function for dose adjustments as indicated.    Height: 5' (152.4 cm) Weight: 62.9 kg (138 lb 10.7 oz) IBW/kg (Calculated) : 45.5  Temp (24hrs), Avg:100.5 F (38.1 C), Min:98.9 F (37.2 C), Max:104.3 F (40.2 C)  Recent Labs  Lab 10/13/21 0026 10/13/21 0500 10/14/21 0216 10/19/21 0617 10/19/21 0635 10/19/21 0732 10/19/21 1306 10/19/21 1512  WBC 13.9*  --  4.8 5.3  --   --   --   --   CREATININE 4.71* 4.84* 3.24* 4.77* 4.90* 4.90*  --   --   LATICACIDVEN  --   --   --  2.4*  --   --  1.2 1.2     Estimated Creatinine Clearance: 8.1 mL/min (A) (by C-G formula based on SCr of 4.9 mg/dL (H)).    Allergies  Allergen Reactions   Sulfa Antibiotics Other (See Comments)    "AKI"    Erythromycin Itching   Pravastatin Other (See Comments)    Unknown reaction   Simvastatin Other (See Comments)    Reaction not known   Codeine Nausea Only and Other (See Comments)    Tolerate Hycodan   Gemfibrozil Itching    Thank you for allowing pharmacy to be a part of this patient's care.  [employer], Pharm.D., BCPS Clinical Pharmacist  **Pharmacist phone directory can be found on [email or web address] listed under [place].  10/19/2021 7:12 PM

## 2021-10-19 NOTE — ED Provider Notes (Signed)
  Physical Exam  BP (!) 95/45   Pulse 78   Temp (!) 104.3 F (40.2 C) (Rectal)   Resp (!) 21   Ht 5' (1.524 m)   Wt 62.9 kg   SpO2 95%   BMI 27.08 kg/m   Physical Exam  Procedures  Procedures CRITICAL CARE Performed by: Elgie Congo   Total critical care time: 45 minutes  Critical care time was exclusive of separately billable procedures and treating other patients.  Critical care was necessary to treat or prevent imminent or life-threatening deterioration.  Critical care was time spent personally by me on the following activities: development of treatment plan with patient and/or surrogate as well as nursing, discussions with consultants, evaluation of patient's response to treatment, examination of patient, obtaining history from patient or surrogate, ordering and performing treatments and interventions, ordering and review of laboratory studies, ordering and review of radiographic studies, pulse oximetry and re-evaluation of patient's condition.  ED Course / MDM   Clinical Course as of 10/19/21 1210  Mon Oct 19, 2021  0742 75 F with CAD sp CABG, recent NSTEMI, lung cancer s/p XRT no longer on therapy, ESRD on HD Tu/Th/Sat now with persistent cough and new fever, nausea and vomiting found to by hypoxic with EMS down to 50s when ambulating. No CP. CXR suggestive of bibasilar infiltrates. Normal WBC.  Will cover with broad-spectrum antibiotics if COVID-negative. [VB]  [id number] Reassessed patient, we have her currently on 3 L nasal cannula satting 92%.  She does note having history of multiple UTIs and has some suprapubic tenderness on exam with fever and nausea.  I have ordered for broad-spectrum antibiotics due to her recent hospitalization and because patient notes being on 2 antibiotics course prior to this with one being doxycycline.  [VB]  [id number] Trialed patient on nasal cannula, required nonrebreather, will page RT for high flow nasal cannula.  D-dimer elevated at 6.04,  ordered for CTA PE and CT abdomen pelvis with IV contrast. [VB]  1148 Repeat blood pressures have been downtrending, ordered for 500 cc IV fluids but since she is ESRD, holding off from heavy fluids that she has received vancomycin and cefepime, ordered for norepinephrine to maintain MAP greater than 65.  CTA negative for PE but evidence of multifocal infection.  Reaching out to intensivist. [VB]  1209 Discussed case with intensivist who will be down to evaluate patient put in order for admission. [VB]    Clinical Course User Index [VB] Elgie Congo, MD   Medical Decision Making Amount and/or Complexity of Data Reviewed Labs: ordered. Radiology: ordered.  Risk OTC drugs. Prescription drug management. Decision regarding hospitalization.          Elgie Congo, MD 10/19/21 1210

## 2021-10-19 NOTE — ED Provider Notes (Signed)
[facility] EMERGENCY DEPARTMENT Provider Note  CSN: [id number] Arrival date & time: 10/19/21 6767  Chief Complaint(s) Shortness of Breath ED Triage Notes Alla Feeling, RN (Registered Nurse)   Emergency Medicine   Date of Service: 10/19/2021  6:01 AM   Signed   Pt BIB EMS from home. Pt woke up from sleep with fever and vomiting around 3am. Pt was 58% on room air and cyanotic. EMS reports more dyspnea with exertion. Pt improved to 98% on NRB.  Pt has cough x2 weeks and NSTEMI last week. Pt gets dialysis Tu, Thur, Sat - no missed sessions.  Pt also has hx of lung cancer in right lung  VS with EMS  170/66 HR 115  Temp 100.8     HPI Dana Jackson is a 76 y.o. female with a past medical history listed below including ESRD on dialysis TTS, lung cancer status post chemo/radiation, recent NSTEMI with three-vessel disease treated with medical management who presents to the emergency department for several weeks of cough who developed a fever and emesis today.  Because of this EMS was called.  Patient did not have any obvious shortness of breath on their initial evaluation.  While ambulating to the stretcher, patient became tachypneic.  She was noted to be hypoxic with sats of 58% on room air.  Improved with nonrebreather.  Patient denied any associated chest pain.  No lower extremity edema.  No known sick contacts.  The history is provided by the patient and the EMS personnel.    Past Medical History Past Medical History:  Diagnosis Date   Anemia    Arthritis    hands   Asthma    childhood   Bacteremia due to Escherichia coli 06/2013   Currently being treated with anti-biotics   CAD in native artery 12/2010   a) Cath for exertional angina & EKG changes: 40% LM, 80% mid LAD.  95% ost cX, 80-90% PDA --> CABG X3; b) Post CABG CATH for + Myoview with basal anterior ischemia -> Ost LAD 80% (diffuse) then 100% after SP2, RI 70% (too small for PCI), Ost-prox Cx 99% & OM1  90%, OstrPDA 80% (small); Occluded SVG-rPDA.  Patent LIMA-dLAD, SVG-OM: culprit ~ p-m LAD pre-LIMA & RI - not good PCI target --> Med Rx   CKD (chronic kidney disease) stage 3, GFR 30-59 ml/min (HCC)    Degenerative disc disease, cervical    Degenerative disc disease, cervical [722.4]   Diabetes mellitus without complication (HCC)    Type II   Dyspnea    Endometrial cancer ([place]) 11/2013   Treated with TAH with pelvic lymphadenectomy followed by radiation and chemotherapy   GERD (gastroesophageal reflux disease)    Hearing loss    bilateral - no hearing aids   Heart murmur    History of asthma     childhood   History of blood transfusion    History of pneumonia    "2-3 times"   History of unstable angina November/December 2012   T wave inversions in inferolateral leads.  No stress test performed.   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   Carcinoid tumor of left lung 01/07/2021   History of renal cell cancer 01/07/2021   History of endometrial cancer 01/07/2021   Lung nodules 12/12/2020   COVID-19 10/03/2020   Recurrent UTI 09/10/2020   Vitamin B12 deficiency 09/08/2020   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   Iron deficiency anemia, unspecified 07/30/2019   Mild protein-calorie malnutrition ([place]) 05/02/2019   End stage renal disease ([place]) 04/30/2019   Personal history of anaphylaxis  04/30/2019   Pruritus, unspecified 04/30/2019   Pain, unspecified 04/30/2019   Coagulation defect, unspecified ([place]) 04/30/2019   ESRD on hemodialysis ([place]) 04/29/2019   CKD (chronic kidney disease) stage 5, GFR less than 15 ml/min (HCC) 03/27/2019   Fever 03/27/2019   Age-related osteoporosis without current pathological fracture 07/12/2018   Gastroesophageal reflux disease without esophagitis 06/09/2016   Dysuria 06/10/2015   Candidal intertrigo 05/19/2015   Chronic cough 03/21/2015   Coronary artery disease involving native coronary artery of native heart with angina pectoris ([place]) 09/08/2014   Acute-on-chronic kidney injury ([place]) 07/29/2014   Hyperkalemia 07/29/2014   CAD (coronary artery disease) of artery bypass graft 07/29/2014   Vulvar abscess 07/22/2014   Type 2 diabetes mellitus with renal manifestations, controlled ([place]) 07/10/2014   Endometrial cancer ([place]) 07/10/2014   Encounter for follow-up 06/14/2014   Bacteremia 06/13/2014   Encounter for chemotherapy management 12/24/2013   History of TIAs 11/30/2013   Obesity (BMI 30-39.9) 09/03/2012   Hyperlipidemia associated with type 2 diabetes mellitus ([place])    Anemia 12/28/2010   S/P CABG x 3 12/25/2010   Renal cell carcinoma ([place]) 12/23/2010    Class: History of   Essential hypertension 12/23/2010    Class: History of   Home Medication(s) Prior to Admission medications   Medication Sig Start Date End Date Taking? Authorizing Provider  acetaminophen (TYLENOL) 500 MG tablet Take 1,000 mg by mouth every 6 (six) hours as needed for moderate pain.    [provider]  Alpha-Lipoic Acid 600 MG CAPS Take 600 mg by mouth daily.    [provider]  aspirin 81 MG tablet Take 81 mg by mouth at bedtime.    [provider]  atorvastatin (LIPITOR) 80 MG tablet Take 1 tablet (80 mg total) by mouth at bedtime. 04/29/19 10/14/22  [place] Donnamarie Poag, DO  benzonatate (TESSALON) 100 MG capsule Take 100-200 mg by mouth  3 (three) times daily as needed for cough.    [provider]  calcitRIOL (ROCALTROL) 0.25 MCG capsule Take by mouth.    [provider]  cinacalcet (SENSIPAR) 30 MG tablet Take 30 mg by mouth daily with breakfast. 04/18/20   [provider]  clopidogrel (PLAVIX) 75 MG tablet Take 1 tablet (75 mg total) by mouth daily. 10/15/21 12/14/21  Elodia Florence., MD  cyanocobalamin (,VITAMIN B-12,) 1000 MCG/ML injection Inject 1 mL (1,000 mcg total) into the muscle every 30 (thirty) days for 90 doses. Patient not taking: Reported on 10/13/2021 06/27/19 10/19/26  [place] Donnamarie Poag, DO  Darbepoetin Alfa (ARANESP) 60 MCG/0.3ML SOSY injection Inject 0.3 mLs (60 mcg total) into the vein every Tuesday with hemodialysis. 05/01/19   [place] Donnamarie Poag, DO  estradiol (ESTRACE) 0.1 MG/GM vaginal cream Place 1 Applicatorful  vaginally 3 (three) times a week.    [provider]  famotidine (PEPCID) 20 MG tablet Take 1 tablet (20 mg total) by mouth at bedtime. 04/29/19 10/14/22  [place] Donnamarie Poag, DO  furosemide (LASIX) 20 MG tablet Take 20 mg by mouth 2 (two) times a week. Tues and Saturday night    [provider]  gabapentin (NEURONTIN) 100 MG capsule Take 100 mg by mouth 2 (two) times daily.    [provider]  iron sucrose in sodium chloride 0.9 % 100 mL Iron Sucrose (Venofer) 02/05/21 02/04/22  [provider]  isosorbide mononitrate (IMDUR) 60 MG 24 hr tablet TAKE 1 TABLET BY MOUTH EVERY DAY Patient taking differently: Take 60 mg by mouth at bedtime. 02/02/21   Leonie Man, MD  lidocaine-prilocaine (EMLA) cream Apply 1 application topically Every Tuesday,Thursday,and Saturday with dialysis. 12/20/19   [provider]  loperamide (IMODIUM A-D) 2 MG tablet Take 4 mg by mouth as needed for diarrhea or loose stools. 03/18/20   [provider]  loratadine (CLARITIN) 10 MG tablet Take 10 mg by mouth daily.    [provider]  magnesium oxide  (MAG-OX) 400 MG tablet Take 400 mg by mouth daily.    [provider]  Methoxy PEG-Epoetin Beta (MIRCERA IJ) Mircera 02/26/21 02/25/22  [provider]  metoprolol tartrate (LOPRESSOR) 50 MG tablet Take 1 tablet (50 mg total) by mouth 2 (two) times daily. schedule an appointment for further refills, 1st attempt 09/28/21   Leonie Man, MD  nitroGLYCERIN (NITROSTAT) 0.4 MG SL tablet PLACE 1 TABLET UNDER THE TONGUE EVERY 5 MINUTES AS NEEDED FOR CHEST PAIN. 10/15/21   Leonie Man, MD  Omega-3 Fatty Acids (FISH OIL) 1200 MG CAPS Take 2,400 mg by mouth in the morning and at bedtime.    [provider]  omeprazole (PRILOSEC) 40 MG capsule Take 40 mg by mouth daily before breakfast.    [provider]  Probiotic Product (ALIGN) 4 MG CAPS Take 4 mg by mouth daily.    [provider]  promethazine-dextromethorphan (PROMETHAZINE-DM) 6.25-15 MG/5ML syrup Take 5 mLs by mouth 4 (four) times daily as needed for cough. Patient not taking: Reported on 10/13/2021 09/28/21   [provider]  pyridOXINE (VITAMIN B-6) 100 MG tablet Take 200 mg by mouth daily.     [provider]  sevelamer carbonate (RENVELA) 800 MG tablet Take 800 mg by mouth See admin instructions. Take 800 mg with each meal and each snack 09/16/19   [provider]                                                                                                                                    Allergies Sulfa antibiotics, Erythromycin, Pravastatin, Simvastatin, Codeine, and Gemfibrozil  Review of Systems Review of Systems As noted in HPI  Physical Exam Vital Signs  I have reviewed the triage vital  signs BP 111/62   Pulse 91   Temp (!) 104.3 F (40.2 C) (Rectal)   Resp (!) 26   Ht 5' (1.524 m)   Wt 62.9 kg   SpO2 100%   BMI 27.08 kg/m   Physical Exam Vitals reviewed.  Constitutional:      General: She is not in acute distress.    Appearance: She is  well-developed. She is not diaphoretic.  HENT:     Head: Normocephalic and atraumatic.     Nose: Nose normal.  Eyes:     General: No scleral icterus.       Right eye: No discharge.        Left eye: No discharge.     Conjunctiva/sclera: Conjunctivae normal.     Pupils: Pupils are equal, round, and reactive to light.  Cardiovascular:     Rate and Rhythm: Regular rhythm. Tachycardia present.     Heart sounds: No murmur heard.    No friction rub. No gallop.     Comments: Left brachial AVF Pulmonary:     Effort: Pulmonary effort is normal. Tachypnea present. No respiratory distress.     Breath sounds: No stridor. Wheezing (faint exp) present. No rales.  Abdominal:     General: There is no distension.     Palpations: Abdomen is soft.     Tenderness: There is no abdominal tenderness.  Musculoskeletal:        General: No tenderness.     Cervical back: Normal range of motion and neck supple.  Skin:    General: Skin is warm and dry.     Findings: No erythema or rash.  Neurological:     Mental Status: She is alert and oriented to person, place, and time.     ED Results and Treatments Labs (all labs ordered are listed, but only abnormal results are displayed) Labs Reviewed  LACTIC ACID, PLASMA - Abnormal; Notable for the following components:      Result Value   Lactic Acid, Venous 2.4 (*)    All other components within normal limits  COMPREHENSIVE METABOLIC PANEL - Abnormal; Notable for the following components:   Chloride 97 (*)    Glucose, Bld 114 (*)    BUN 33 (*)    Creatinine, Ser 4.77 (*)    Calcium 8.4 (*)    Total Protein 5.8 (*)    Albumin 2.7 (*)    GFR, Estimated 9 (*)    All other components within normal limits  CBC WITH DIFFERENTIAL/PLATELET - Abnormal; Notable for the following components:   RBC 2.98 (*)    Hemoglobin 10.8 (*)    HCT 31.5 (*)    MCV 105.7 (*)    MCH 36.2 (*)    Lymphs Abs 0.2 (*)    Monocytes Absolute 0.0 (*)    All other components  within normal limits  URINALYSIS, ROUTINE W REFLEX MICROSCOPIC - Abnormal; Notable for the following components:   Color, Urine AMBER (*)    APPearance CLOUDY (*)    Hgb urine dipstick MODERATE (*)    Protein, ur 100 (*)    Leukocytes,Ua LARGE (*)    WBC, UA >50 (*)    Bacteria, UA MANY (*)    Non Squamous Epithelial 0-5 (*)    All other components within normal limits  I-STAT VENOUS BLOOD GAS, ED - Abnormal; Notable for the following components:   pO2, Ven 92 (*)    Bicarbonate 29.3 (*)    Acid-Base Excess 3.0 (*)  Sodium 134 (*)    Calcium, Ion 1.10 (*)    HCT 30.0 (*)    Hemoglobin 10.2 (*)    All other components within normal limits  CULTURE, BLOOD (ROUTINE X 2)  CULTURE, BLOOD (ROUTINE X 2)  URINE CULTURE  SARS CORONAVIRUS 2 BY RT PCR  PROTIME-INR  APTT  BRAIN NATRIURETIC PEPTIDE  D-DIMER, QUANTITATIVE  LACTIC ACID, PLASMA  I-STAT CHEM 8, ED                                                                                                                         EKG    Radiology DG Chest Port 1 View  Result Date: 10/19/2021 CLINICAL DATA:  Shortness of breath.  Respiratory distress. EXAM: PORTABLE CHEST 1 VIEW COMPARISON:  10/13/2021 FINDINGS: 0620 hours. The cardio pericardial silhouette is enlarged. Right greater than left basilar airspace disease with superimposed atelectasis at the left base. No substantial pleural effusion. Telemetry leads overlie the chest. IMPRESSION: Right greater than left basilar airspace disease. Imaging features could be related to dependent edema or infection. Electronically Signed   By: Misty Stanley M.D.   On: 10/19/2021 06:29    Medications Ordered in ED Medications  ceFEPIme (MAXIPIME) 2 g in sodium chloride 0.9 % 100 mL IVPB (has no administration in time range)  acetaminophen (TYLENOL) tablet 650 mg (650 mg Oral Given 10/19/21 0702)                                                                                                                                      Procedures .Critical Care  Performed by: Fatima Blank, MD Authorized by: Fatima Blank, MD   Critical care provider statement:    Critical care time (minutes):  30   Critical care was necessary to treat or prevent imminent or life-threatening deterioration of the following conditions:  Respiratory failure and sepsis   Critical care was time spent personally by me on the following activities:  Development of treatment plan with patient or surrogate, discussions with consultants, evaluation of patient's response to treatment, examination of patient, ordering and review of laboratory studies, ordering and review of radiographic studies, ordering and performing treatments and interventions, pulse oximetry, re-evaluation of patient's condition and review of old charts   (including critical care time)  Medical Decision Making / ED Course   Medical Decision Making Amount and/or Complexity of  Data Reviewed External Data Reviewed: notes.    Details: Cath:  New culprit total occlusion of SVG to the obtuse marginal  Chronic total occlusion of SVG to PDA  Patent LIMA to apical LAD which is totally occluded proximal to the graft insertion site and in the apical segment beyond the graft and insertion site.  70% distal left main.    70% ostial LAD and sequential 80% stenoses in the proximal and mid LAD  Ostial 95 to 99% heavily calcified circumflex with severe diffuse disease in the dominant obtuse marginal.  Moderate mid RCA disease and 70 to 80% distal disease.  95% stenosis in the PDA and 70% proximal LV branch disease.  Preserved LV function with EF approximately 50 to 60% with EDP 12 mmHg  Labs: ordered. Decision-making details documented in ED Course. Radiology: ordered and independent interpretation performed. Decision-making details documented in ED Course. ECG/medicine tests: ordered and independent interpretation performed.  Decision-making details documented in ED Course.  Risk OTC drugs. Prescription drug management. Decision regarding hospitalization.    Patient noted to be febrile, shortness of breath and hypoxic. Sepsis work-up initiated. Initial labs without leukocytosis.  Stable hemoglobin. CMP without significant electrolyte derangements.  Baseline renal function from ESRD. VBG without hypercarbia.  Chest x-ray with evidence of bibasilar opacities possible edema versus infection.  Pending rest of the work-up. If COVID-negative, plan to start patient on antibiotics and admit.  Patient care turned over to oncoming provider. Patient case and results discussed in detail; please see their note for further ED managment.       Final Clinical Impression(s) / ED Diagnoses Final diagnoses:  Fever, unspecified fever cause               This chart was dictated using voice recognition software.  Despite best efforts to proofread,  errors can occur which can change the documentation meaning.    Fatima Blank, MD 10/19/21 [phone]

## 2021-10-19 NOTE — Progress Notes (Signed)
IV watch alerting "red check IV." This RN flushed the IV and observed return of blood when drawing back. Patient with no pain, swelling or redness at site. This RN will continue to use and monitor this IV at this time.

## 2021-10-19 NOTE — ED Notes (Signed)
MD Cardama notified of rectal temp - verbal order for 650 of tylenol

## 2021-10-19 NOTE — Progress Notes (Signed)
Pt passed bedside swallow screen.

## 2021-10-19 NOTE — Progress Notes (Signed)
Pharmacy Antibiotic Note  Dana Jackson is a 76 y.o. female admitted on 10/19/2021 with pneumonia.  Pharmacy has been consulted for vancomycin dosing. [place] with recent admission, discharged on 10/4. Presented to ED with CC of shortness of breath and dyspnea on exertion. Patient was saturating 58% on room air. Patient febrile to 104.3, WBC=5.3.   Patient ESRD, normal HD schedule T,T,S. Patient reports no missed sessions.   Plan: Cefepime per MD  IV Vancomycin '1250mg'$  x 1. Then dose based on HD sessions.  MRSA PCR ordered.  Follow culture data for de-escalation.  Monitor renal function for dose adjustments as indicated.    Height: 5' (152.4 cm) Weight: 62.9 kg (138 lb 10.7 oz) IBW/kg (Calculated) : 45.5  Temp (24hrs), Avg:104.3 F (40.2 C), Min:104.3 F (40.2 C), Max:104.3 F (40.2 C)  Recent Labs  Lab 10/13/21 0026 10/13/21 0500 10/14/21 0216 10/19/21 0617  WBC 13.9*  --  4.8 5.3  CREATININE 4.71* 4.84* 3.24* 4.77*  LATICACIDVEN  --   --   --  2.4*    Estimated Creatinine Clearance: 8.3 mL/min (A) (by C-G formula based on SCr of 4.77 mg/dL (H)).    Allergies  Allergen Reactions   Sulfa Antibiotics Other (See Comments)    "AKI"    Erythromycin Itching   Pravastatin Other (See Comments)    Unknown reaction   Simvastatin Other (See Comments)    Reaction not known   Codeine Nausea Only and Other (See Comments)    Tolerate Hycodan   Gemfibrozil Itching    Thank you for allowing pharmacy to be a part of this patient's care.  Ventura Sellers 10/19/2021 7:59 AM

## 2021-10-19 NOTE — ED Triage Notes (Signed)
Pt BIB EMS from home. Pt woke up from sleep with fever and vomiting around 3am. Pt was 58% on room air and cyanotic. EMS reports more dyspnea with exertion. Pt improved to 98% on NRB.  Pt has cough x2 weeks and NSTEMI last week. Pt gets dialysis Tu, Thur, Sat - no missed sessions.  Pt also has hx of lung cancer in right lung  VS with EMS  170/66 HR 115  Temp 100.8

## 2021-10-19 NOTE — ED Notes (Signed)
Patient switched from nonrebreather to [place] @ 2L

## 2021-10-19 NOTE — H&P (Addendum)
NAME:  Dana Jackson, MRN:  [id number], DOB:  [birth year], LOS: 0 ADMISSION DATE:  10/19/2021, CONSULTATION DATE:  10/19/21 REFERRING MD:  EDP CHIEF COMPLAINT:  Hypotension and Hypoxia   History of Present Illness:  Dana Jackson is a 76 y.o. female with a past medical history listed below including ESRD on dialysis TTS, lung cancer status post chemo/radiation, recent NSTEMI with three-vessel disease treated with medical management who presents to the emergency department for several weeks of cough who developed a fever and emesis today.  Because of this EMS was called.  Patient did not have any obvious shortness of breath on their initial evaluation.  While ambulating to the stretcher, patient became tachypneic.  She was noted to be hypoxic with sats of 58% on room air.  Improved with nonrebreather.  Patient denied any associated chest pain.  No lower extremity edema.  No known sick contacts. States was at baseline after the last hospitalization but then the cough started. She also has been having chills for the last few days.   Labs show VBG 7.34/53/92 with bicarb 29.3. BNP elevated 639.1. CBC without leukocytosis but slight anemia with MCV of 105.7. LA 2.4. UA with leukocytes and WBC, hgb, RBC.   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  Significant Hospital Events: Including procedures, antibiotic start and stop dates in addition to other pertinent events   10/09-Admitted to ICU Interim History / Subjective:  States doing well.  Review of Systems:   Negative unless stated in the HPI.  Objective   Blood pressure (!) 96/42, pulse 82, temperature 98.9 F (37.2 C), temperature source Oral, resp. rate 17, height 5' (1.524 m), weight 62.9 kg, SpO2 98 %.    FiO2 (%):  [45 %] 45 %   Intake/Output  Summary (Last 24 hours) at 10/19/2021 1223 Last data filed at 10/19/2021 1208 Gross per 24 hour  Intake 500 ml  Output --  Net 500 ml   Filed Weights   10/19/21 0607  Weight: 62.9 kg   Examination: General: NAD HENT: Dry MM, NCAT, non-re-breather  Lungs: bibasilar rales, wheeze present Cardiovascular: NSR, systolic murmur present greatest in the aortic region III/VI, radial pulses palpated, No LE edema Abdomen: slightly distended, No TTP, normal bowel sounds Extremities: No asymmetry Neuro: alert but oriented to place and person but not time GU: no foley.  Consults  Nephrology Resolved Hospital Problem list    Assessment & Plan:  #Septic Shock 2/2 to HAP #Acute Hypoxic Respiratory Failure 2/2 to HAP #Hx of NSTEMI Patient presented to ED with coughing for one week that has worsened. Pt had worsening coughing and had vomit this am with the cough. Cough has green colored sputum. Pt endorsed chills and fevers at home. Fever here was 104.3. Concern for HAP given recent admission for NSTEMI. Pt received Vanc and Cefepime in the ED. Will continue Vanc and Switch to Zosyn for HAP coverage. Pt currently hypotensive which may be from the infection and from concomitant dehydration of so will give 500 cc LR bolus and then follow up and give another bolus if improvement in BP and no decrease in oxygen saturation. Pt currently at 7 mcg of Levophed and will continue and titrate down with MAP goal >65. Lactic acid mildly elevated. Repeat VBG shows 7.38/39/45. UA showed leukocytes and bacteria and RBCs. Urine  and blood culture pending.  -Continue abx as above; follow up urine and blood culture.  -Continue Levophed and wean as able, hold off on line placement as pressor requirement is low at the moment.  -LR 500 ccs followed by 500 ccs. Encourage PO intake -Trend troponin and LA.  -Trend fever curve -Follow up Procal -Monitor CBC and BMP -Delirium Precautions  #ESRD on HD TThSat Chronic. Will  consult nephrology as pt is admitted and may need HD tomorrow.   #Anemia Multifactorial. Hgb at 10.8. MCV at 105.7. Pt has ESRD and hgb is at goal. Given MCV will get VB12 and Folate.  -Follow up VB12 and folate  #Hypertension  Chronic. Will hold home meds given problem 1.  -Restart home meds when able.   #History of stroke/TIA  #Hyperlipidemia Chronic. Continue Aspirin, Plavix and Lipitor.    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 Continue outpatient follow up.    #DM 2  Chronic. A1c 5.1 10/2021. CTM.   Best Practice (right click and "Reselect all SmartList Selections" daily)  Diet/type: NPO with meds DVT prophylaxis: prophylactic heparin  GI prophylaxis: PPI Lines: N/A Foley:  N/A Continuous: Levophed  Code Status:  full code Last date of multidisciplinary goals of care discussion [10/19/21] Labs   CBC: Recent Labs  Lab 10/13/21 0026 10/14/21 0216 10/19/21 0617 10/19/21 0635 10/19/21 0732  WBC 13.9* 4.8 5.3  --   --   NEUTROABS  --   --  4.8  --   --   HGB 11.5* 9.7* 10.8* 10.9*  10.2* 10.2*  HCT 33.5* 28.4* 31.5* 32.0*  30.0* 30.0*  MCV 108.1* 106.4* 105.7*  --   --   PLT 188 156 159  --   --    Basic Metabolic Panel: Recent Labs  Lab 10/13/21 0026 10/13/21 0500 10/14/21 0216 10/19/21 0617 10/19/21 0635 10/19/21 0732  NA 136  --  137 135 136  134* 135  K 6.0*  --  4.0 3.8 3.9  3.9 3.8  CL 98  --  94* 97* 96* 96*  CO2 27  --  31 28  --   --   GLUCOSE 188*  --  105* 114* 112* 107*  BUN 57*  --  27* 33* 33* 34*  CREATININE 4.71* 4.84* 3.24* 4.77* 4.90* 4.90*  CALCIUM 8.7*  --  8.9 8.4*  --   --   MG  --  2.8* 2.1  --   --   --    GFR: Estimated Creatinine Clearance: 8.1 mL/min (A) (by C-G formula based on SCr of 4.9 mg/dL (H)). Recent Labs  Lab 10/13/21 0026 10/14/21 0216 10/19/21 0617  WBC 13.9* 4.8 5.3  LATICACIDVEN  --   --  2.4*   Liver Function Tests: Recent Labs  Lab 10/19/21 0617  AST 33   ALT 28  ALKPHOS 101  BILITOT 0.7  PROT 5.8*  ALBUMIN 2.7*   No results for input(s): "LIPASE", "AMYLASE" in the last 168 hours. No results for input(s): "AMMONIA" in the last 168 hours. ABG    Component Value Date/Time   PHART 7.326 (L) 12/25/2010 1843   PCO2ART 40.7 12/25/2010 1843   PO2ART 70.0 (L) 12/25/2010 1843   HCO3 29.3 (H) 10/19/2021 0635   TCO2 29 10/19/2021 0732   ACIDBASEDEF 4.0 (H) 12/25/2010 1843   O2SAT 96 10/19/2021 0635    Coagulation Profile: Recent Labs  Lab 10/19/21 0617  INR 1.0   Cardiac Enzymes:  No results for input(s): "CKTOTAL", "CKMB", "CKMBINDEX", "TROPONINI" in the last 168 hours. HbA1C: Hgb A1c MFr Bld  Date/Time Value Ref Range Status  10/13/2021 05:00 AM 5.1 4.8 - 5.6 % Final    Comment:    (NOTE) Pre diabetes:          5.7%-6.4%  Diabetes:              >6.4%  Glycemic control for   <7.0% adults with diabetes   04/27/2019 07:04 AM 5.6 4.8 - 5.6 % Final    Comment:    (NOTE) Pre diabetes:          5.7%-6.4% Diabetes:              >6.4% Glycemic control for   <7.0% adults with diabetes    CBG: Recent Labs  Lab 10/13/21 1817 10/13/21 2141 10/14/21 0033 10/14/21 0441 10/14/21 0801  GLUCAP 90 210* 121* 102* 113*   Past Medical History:  She,  has a past medical history of Anemia, Arthritis, Asthma, Bacteremia due to Escherichia coli (06/2013), CAD in native artery (12/2010), CKD (chronic kidney disease) stage 3, GFR 30-59 ml/min ([place]), Degenerative disc disease, cervical, Diabetes mellitus without complication ([place]), Dyspnea, Endometrial cancer ([place]) (11/2013), GERD (gastroesophageal reflux disease), Hearing loss, Heart murmur, History of asthma, History of blood transfusion, History of pneumonia, History of unstable angina (November/December 2012), Hyperlipidemia LDL goal <70, Hypertension, essential, benign, Neuropathy, Pneumonia (2018), Renal cell carcinoma ([place]) (11/18/2008), S/P CABG x 3 (12/2010), Stroke ([place]), and TIA  (transient ischemic attack) (1992 &  2010).  Surgical History:   Past Surgical History:  Procedure Laterality Date   ANTERIOR CERVICAL DECOMP/DISCECTOMY FUSION  10/11/2005   multi-level   AV FISTULA PLACEMENT Left 08/03/2017   Procedure: ARTERIOVENOUS (AV) FISTULA CREATION LEFT ARM;  Surgeon: Rosetta Posner, MD;  Location: [facility];  Service: Vascular;  Laterality: Left;   BRONCHIAL BIOPSY  12/30/2020   Procedure: BRONCHIAL BIOPSIES;  Surgeon: Garner Nash, DO;  Location: [facility];  Service: Pulmonary;;   BRONCHIAL BIOPSY  02/03/2021   Procedure: BRONCHIAL BIOPSIES;  Surgeon: Garner Nash, DO;  Location: [facility];  Service: Pulmonary;;   BRONCHIAL BRUSHINGS  12/30/2020   Procedure: BRONCHIAL BRUSHINGS;  Surgeon: Garner Nash, DO;  Location: [facility];  Service: Pulmonary;;   BRONCHIAL BRUSHINGS  02/03/2021   Procedure: BRONCHIAL BRUSHINGS;  Surgeon: Garner Nash, DO;  Location: [facility];  Service: Pulmonary;;   BRONCHIAL NEEDLE ASPIRATION BIOPSY  12/30/2020   Procedure: BRONCHIAL NEEDLE ASPIRATION BIOPSIES;  Surgeon: Garner Nash, DO;  Location: [facility];  Service: Pulmonary;;   BRONCHIAL NEEDLE ASPIRATION BIOPSY  02/03/2021   Procedure: BRONCHIAL NEEDLE ASPIRATION BIOPSIES;  Surgeon: Garner Nash, DO;  Location: [facility];  Service: Pulmonary;;   CARDIAC CATHETERIZATION  12/24/10   40% left main, 80% mid LAD, 95% ostial circumflex, 80-90% PDA.   CARDIAC CATHETERIZATION N/A 07/01/2014   Procedure: Left Heart Cath and Cors/Grafts Angiography;  Surgeon: Leonie Man, MD;  Location: [facility] LAB:  For Abn Nuc @ [facility]: Ost LAD 80%, mid LAD 100% after S/P 2, 70% RI (no PCI target), ost-prox Cx 99%, OM1 90%. Ost rPDA 80% (~ pre-CABG), occluded SVG-rPDA, patent LIMA-dLAD, SVG OM; potential culprit for abn Nuc scan = pLAD Dz, small RI 70% or PDA -> med Rx   CAROTID ENDARTERECTOMY Left    CHOLECYSTECTOMY  ~ [place]  GRAFT  12/25/2010  Procedure: CORONARY ARTERY BYPASS GRAFTING (CABGX3 - LIMA-LAD, SVG RPL, SVG-Circumflex);  Surgeon: Rexene Alberts, MD;  Location: [facility];  Service: Open Heart Surgery;  Laterality: N/A;   DILATATION & CURETTAGE/HYSTEROSCOPY WITH MYOSURE N/A 11/02/2013   Procedure: DILATATION & CURETTAGE/HYSTEROSCOPY WITH MYOSURE ABLATION;  Surgeon: Allena Katz, MD;  Location: [facility];  Service: Gynecology;  Laterality: N/A;   DOPPLER ECHOCARDIOGRAPHY  12/08/2010   EF =>55%,MILD CONCENTRIC LEFT VENTRICULAR HYPERTROPHY   EYE SURGERY Bilateral    Cataract   IR DIALY SHUNT INTRO NEEDLE/INTRACATH INITIAL W/IMG LEFT Left 04/24/2019   IR FLUORO GUIDE CV LINE LEFT  04/26/2019   IR US GUIDE VASC ACCESS LEFT  04/24/2019   IR US GUIDE VASC ACCESS LEFT  04/26/2019   LEFT HEART CATH AND CORS/GRAFTS ANGIOGRAPHY N/A 10/13/2021   Procedure: LEFT HEART CATH AND CORS/GRAFTS ANGIOGRAPHY;  Surgeon: Belva Crome, MD;  Location: [facility];  Service: Cardiovascular;  Laterality: N/A;   LEFT HEART CATHETERIZATION WITH CORONARY ANGIOGRAM N/A 12/24/2010   Procedure: LEFT HEART CATHETERIZATION WITH CORONARY ANGIOGRAM;  Surgeon: Leonie Man, MD;  Location: [facility];  Service: Cardiovascular;  Laterality: N/A;   NM MYOCAR SINGLE W/SPECT  07/26/2007   EF 79%, LEFT VENT.FUNCTION NORMAL   PARTIAL NEPHRECTOMY Left 11/18/2008   left partial nephrectomy for renal cell CA   PET Myocardial Perfusion Scan  06/13/2014   At [facility]: Moderate size, mild severity completely reversible defect involving the basal anterior, mid anterior and apical anterior segments consistent with ischemia. EF 65% with normal global function.   TONSILLECTOMY     "in college"   TOTAL ABDOMINAL HYSTERECTOMY  November 2015    At [facility]: Robotic procedure with pelvic lymphadenectomy   TRANSTHORACIC ECHOCARDIOGRAM  06/13/2014   At [place]: EF 60-65%. Grade 1 diastolic dysfunction. Mild MR. Aortic sclerosis.  Moderate pulmonary hypertension   TUBAL LIGATION  ~ 1984   UPPER GI ENDOSCOPY     VIDEO BRONCHOSCOPY WITH RADIAL ENDOBRONCHIAL ULTRASOUND  12/30/2020   Procedure: RADIAL ENDOBRONCHIAL ULTRASOUND;  Surgeon: Garner Nash, DO;  Location: [facility];  Service: Pulmonary;;   VIDEO BRONCHOSCOPY WITH RADIAL ENDOBRONCHIAL ULTRASOUND  02/03/2021   Procedure: RADIAL ENDOBRONCHIAL ULTRASOUND;  Surgeon: Garner Nash, DO;  Location: [facility];  Service: Pulmonary;;   WISDOM TOOTH EXTRACTION      Social History:   reports that she has never smoked. She has never used smokeless tobacco. She reports that she does not currently use alcohol. She reports that she does not use drugs.  Family History:  Her family history includes AAA (abdominal aortic aneurysm) in her mother; Coronary artery disease in her brother, father, and sister; GER disease in her mother; Heart disease in her brother and father; Multiple myeloma in her mother; Stroke in her maternal grandmother. There is no history of Heart attack.  Allergies Allergies  Allergen Reactions   Sulfa Antibiotics Other (See Comments)    "AKI"    Erythromycin Itching   Pravastatin Other (See Comments)    Unknown reaction   Simvastatin Other (See Comments)    Reaction not known   Codeine Nausea Only and Other (See Comments)    Tolerate Hycodan   Gemfibrozil Itching    Home Medications  Prior to Admission medications   Medication Sig Start Date End Date Taking? Authorizing Provider  acetaminophen (TYLENOL) 500 MG tablet Take 1,000 mg by mouth every 6 (six) hours as needed for moderate pain.  [provider]  Alpha-Lipoic Acid 600 MG CAPS Take 600 mg by mouth daily.    [provider]  aspirin 81 MG tablet Take 81 mg by mouth at bedtime.    [provider]  atorvastatin (LIPITOR) 80 MG tablet Take 1 tablet (80 mg total) by mouth at bedtime. 04/29/19 10/14/22  [place] Donnamarie Poag, DO  benzonatate (TESSALON) 100 MG capsule  Take 100-200 mg by mouth 3 (three) times daily as needed for cough.    [provider]  calcitRIOL (ROCALTROL) 0.25 MCG capsule Take by mouth.    [provider]  cinacalcet (SENSIPAR) 30 MG tablet Take 30 mg by mouth daily with breakfast. 04/18/20   [provider]  clopidogrel (PLAVIX) 75 MG tablet Take 1 tablet (75 mg total) by mouth daily. 10/15/21 12/14/21  Elodia Florence., MD  cyanocobalamin (,VITAMIN B-12,) 1000 MCG/ML injection Inject 1 mL (1,000 mcg total) into the muscle every 30 (thirty) days for 90 doses. Patient not taking: Reported on 10/13/2021 06/27/19 10/19/26  [place] Donnamarie Poag, DO  Darbepoetin Alfa (ARANESP) 60 MCG/0.3ML SOSY injection Inject 0.3 mLs (60 mcg total) into the vein every Tuesday with hemodialysis. 05/01/19   [place] Donnamarie Poag, DO  estradiol (ESTRACE) 0.1 MG/GM vaginal cream Place 1 Applicatorful vaginally 3 (three) times a week.    [provider]  famotidine (PEPCID) 20 MG tablet Take 1 tablet (20 mg total) by mouth at bedtime. 04/29/19 10/14/22  [place] Donnamarie Poag, DO  furosemide (LASIX) 20 MG tablet Take 20 mg by mouth 2 (two) times a week. Tues and Saturday night    [provider]  gabapentin (NEURONTIN) 100 MG capsule Take 100 mg by mouth 2 (two) times daily.    [provider]  iron sucrose in sodium chloride 0.9 % 100 mL Iron Sucrose (Venofer) 02/05/21 02/04/22  [provider]  isosorbide mononitrate (IMDUR) 60 MG 24 hr tablet TAKE 1 TABLET BY MOUTH EVERY DAY Patient taking differently: Take 60 mg by mouth at bedtime. 02/02/21   Leonie Man, MD  lidocaine-prilocaine (EMLA) cream Apply 1 application topically Every Tuesday,Thursday,and Saturday with dialysis. 12/20/19   [provider]  loperamide (IMODIUM A-D) 2 MG tablet Take 4 mg by mouth as needed for diarrhea or loose stools. 03/18/20   [provider]  loratadine (CLARITIN) 10 MG tablet Take 10 mg by mouth daily.    [provider]  magnesium oxide (MAG-OX) 400 MG tablet Take 400 mg by mouth daily.    [provider]  Methoxy PEG-Epoetin Beta (MIRCERA IJ) Mircera 02/26/21 02/25/22  [provider]  metoprolol tartrate (LOPRESSOR) 50 MG tablet Take 1 tablet (50 mg total) by mouth 2 (two) times daily. schedule an appointment for further refills, 1st attempt 09/28/21   Leonie Man, MD  nitroGLYCERIN (NITROSTAT) 0.4 MG SL tablet PLACE 1 TABLET UNDER THE TONGUE EVERY 5 MINUTES AS NEEDED FOR CHEST PAIN. 10/15/21   Leonie Man, MD  Omega-3 Fatty Acids (FISH OIL) 1200 MG CAPS Take 2,400 mg by mouth in the morning and at bedtime.    [provider]  omeprazole (PRILOSEC) 40 MG capsule Take 40 mg by mouth daily before breakfast.    [provider]  Probiotic Product (ALIGN) 4 MG CAPS Take 4 mg by mouth daily.    [provider]  promethazine-dextromethorphan (PROMETHAZINE-DM) 6.25-15 MG/5ML syrup Take 5 mLs by mouth 4 (four) times daily as needed for cough. Patient not taking: Reported on  10/13/2021 09/28/21   [provider]  pyridOXINE (VITAMIN B-6) 100 MG tablet Take 200 mg by mouth daily.     [provider]  sevelamer carbonate (RENVELA) 800 MG tablet Take 800 mg by mouth See admin instructions. Take 800 mg with each meal and each snack 09/16/19   [provider]    Idamae Schuller, MD Tillie Rung. [facility] Internal Medicine Residency, PGY-2

## 2021-10-20 ENCOUNTER — Encounter (HOSPITAL_COMMUNITY): Payer: Self-pay | Admitting: Internal Medicine

## 2021-10-20 DIAGNOSIS — R6521 Severe sepsis with septic shock: Secondary | ICD-10-CM | POA: Diagnosis not present

## 2021-10-20 DIAGNOSIS — A419 Sepsis, unspecified organism: Secondary | ICD-10-CM | POA: Diagnosis not present

## 2021-10-20 LAB — BASIC METABOLIC PANEL
Anion gap: 12 (ref 5–15)
BUN: 41 mg/dL — ABNORMAL HIGH (ref 8–23)
CO2: 21 mmol/L — ABNORMAL LOW (ref 22–32)
Calcium: 8.3 mg/dL — ABNORMAL LOW (ref 8.9–10.3)
Chloride: 97 mmol/L — ABNORMAL LOW (ref 98–111)
Creatinine, Ser: 5.39 mg/dL — ABNORMAL HIGH (ref 0.44–1.00)
GFR, Estimated: 8 mL/min — ABNORMAL LOW (ref >60)
Glucose, Bld: 71 mg/dL (ref 70–99)
Potassium: 4.1 mmol/L (ref 3.5–5.1)
Sodium: 130 mmol/L — ABNORMAL LOW (ref 135–145)

## 2021-10-20 LAB — CBC
HCT: 25 % — ABNORMAL LOW (ref 36.0–46.0)
Hemoglobin: 8.7 g/dL — ABNORMAL LOW (ref 12.0–15.0)
MCH: 36.6 pg — ABNORMAL HIGH (ref 26.0–34.0)
MCHC: 34.8 g/dL (ref 30.0–36.0)
MCV: 105 fL — ABNORMAL HIGH (ref 80.0–100.0)
Platelets: 130 10*3/uL — ABNORMAL LOW (ref 150–400)
RBC: 2.38 MIL/uL — ABNORMAL LOW (ref 3.87–5.11)
RDW: 14.9 % (ref 11.5–15.5)
WBC: 9.1 10*3/uL (ref 4.0–10.5)
nRBC: 0 % (ref 0.0–0.2)

## 2021-10-20 LAB — PHOSPHORUS: Phosphorus: 3.9 mg/dL (ref 2.5–4.6)

## 2021-10-20 LAB — LEGIONELLA PNEUMOPHILA SEROGP 1 UR AG: L. pneumophila Serogp 1 Ur Ag: NEGATIVE

## 2021-10-20 LAB — FOLATE: Folate: 8.1 ng/mL (ref >5.9)

## 2021-10-20 LAB — MAGNESIUM: Magnesium: 2 mg/dL (ref 1.7–2.4)

## 2021-10-20 LAB — VITAMIN B12: Vitamin B-12: 391 pg/mL (ref 180–914)

## 2021-10-20 MED ORDER — GABAPENTIN 100 MG PO CAPS
100.0000 mg | ORAL_CAPSULE | Freq: Two times a day (BID) | ORAL | Status: DC
  Administered 2021-10-20 – 2021-10-23 (×6): 100 mg via ORAL
  Filled 2021-10-20 (×6): qty 1

## 2021-10-20 MED ORDER — CHLORHEXIDINE GLUCONATE CLOTH 2 % EX PADS
6.0000 | MEDICATED_PAD | Freq: Every day | CUTANEOUS | Status: DC
  Administered 2021-10-20: 6 via TOPICAL

## 2021-10-20 MED ORDER — CHLORHEXIDINE GLUCONATE CLOTH 2 % EX PADS
6.0000 | MEDICATED_PAD | Freq: Every day | CUTANEOUS | Status: DC
  Administered 2021-10-21: 6 via TOPICAL

## 2021-10-20 MED ORDER — GABAPENTIN 100 MG PO CAPS: 100.0000 mg | ORAL_CAPSULE | Freq: Two times a day (BID) | ORAL | Status: DC

## 2021-10-20 NOTE — Evaluation (Signed)
Physical Therapy Evaluation Patient Details Name: Dana Jackson MRN: [id number] DOB: Aug 19, 1945 Today's Date: 10/20/2021  History of Present Illness  Dana Jackson is a 76 y.o. female admitted 10/9  who presents to the emergency department for several weeks of cough who developed a fever and emesis today. Also desaturation.  Pt found to have sepsis.  PMH:  ESRD on dialysis TTS, lung cancer status post chemo/radiation, recent NSTEMI with three-vessel disease treated with medical management  Clinical Impression  Pt admitted with above diagnosis. Pt was able to ambulate with fairly steady gait without LOB short distance. Pt has a RW she states she will use at home intiially as she doesn't feel back to her baseline due to being in bed a few days. Of note, pt did desaturate on RA to 85% and needed 3LO2 for O2 to return to >90%.  Husband supportive. HHPT recommended.  Will follow acutely.  Pt currently with functional limitations due to the deficits listed below (see PT Problem List). Pt will benefit from skilled PT to increase their independence and safety with mobility to allow discharge to the venue listed below.          Recommendations for follow up therapy are one component of a multi-disciplinary discharge planning process, led by the attending physician.  Recommendations may be updated based on patient status, additional functional criteria and insurance authorization.  Follow Up Recommendations Home health PT      Assistance Recommended at Discharge Intermittent Supervision/Assistance  Patient can return home with the following  A little help with walking and/or transfers;Assistance with cooking/housework;Assist for transportation;Help with stairs or ramp for entrance    Equipment Recommendations None recommended by PT  Recommendations for Other Services       Functional Status Assessment Patient has had a recent decline in their functional status and demonstrates the ability to make  significant improvements in function in a reasonable and predictable amount of time.     Precautions / Restrictions Precautions Precautions: Fall Restrictions Weight Bearing Restrictions: No      Mobility  Bed Mobility Overal bed mobility: Independent                  Transfers Overall transfer level: Needs assistance Equipment used: None Transfers: Sit to/from Stand Sit to Stand: Min guard, Supervision           General transfer comment: Pt provided with steadying assist initially upon standing.    Ambulation/Gait Ambulation/Gait assistance: Min guard Gait Distance (Feet): 30 Feet Assistive device: 1 person hand held assist Gait Pattern/deviations: Step-through pattern, Decreased stride length, Drifts right/left   Gait velocity interpretation: <1.31 ft/sec, indicative of household ambulator   General Gait Details: Pt was able to ambulate with min guard assist and cues with pt only wanting to walk a short distance today as she states she is slightly unsteady compared to her baseline. Has a RW she can use if needed.  Stairs            Wheelchair Mobility    Modified Rankin (Stroke Patients Only)       Balance Overall balance assessment: Needs assistance Sitting-balance support: No upper extremity supported, Feet supported Sitting balance-Leahy Scale: Fair     Standing balance support: No upper extremity supported, During functional activity Standing balance-Leahy Scale: Fair Standing balance comment: Pt was able to stand statically without Ue support.  Pertinent Vitals/Pain Pain Assessment Pain Assessment: No/denies pain    Home Living Family/patient expects to be discharged to:: Private residence Living Arrangements: Spouse/significant other Available Help at Discharge: Family;Available PRN/intermittently (husband to start working 25 hours week) Type of Home: House Home Access: Stairs to  enter Entrance Stairs-Rails: Left Entrance Stairs-Number of Steps: flight   Home Layout: Two level;Able to live on main level with bedroom/bathroom Home Equipment: Shower seat;Grab bars - tub/shower;Hand held shower head;Cane - single point;Rolling Walker (2 wheels) Additional Comments: HD T,TH, Sat    Prior Function               Mobility Comments: didnt use device ADLs Comments: B/D self     Hand Dominance   Dominant Hand: Right    Extremity/Trunk Assessment   Upper Extremity Assessment Upper Extremity Assessment: Defer to OT evaluation    Lower Extremity Assessment Lower Extremity Assessment: Overall WFL for tasks assessed    Cervical / Trunk Assessment Cervical / Trunk Assessment: Normal  Communication   Communication: No difficulties  Cognition Arousal/Alertness: Awake/alert Behavior During Therapy: WFL for tasks assessed/performed Overall Cognitive Status: Within Functional Limits for tasks assessed                                          General Comments General comments (skin integrity, edema, etc.): 87 bpm, 96% 3L, 12 RR; desaturation to 85% on RA with activity therefore reapplied O2 at 3L and sats >90%.    Exercises     Assessment/Plan    PT Assessment Patient needs continued PT services  PT Problem List Decreased balance;Decreased activity tolerance;Decreased mobility;Decreased safety awareness;Decreased knowledge of use of DME;Decreased knowledge of precautions       PT Treatment Interventions DME instruction;Gait training;Functional mobility training;Therapeutic activities;Therapeutic exercise;Balance training;Patient/family education    PT Goals (Current goals can be found in the Care Plan section)  Acute Rehab PT Goals Patient Stated Goal: to go home PT Goal Formulation: With patient Time For Goal Achievement: 11/03/21 Potential to Achieve Goals: Good    Frequency Min 3X/week     Co-evaluation                AM-PAC PT "6 Clicks" Mobility  Outcome Measure Help needed turning from your back to your side while in a flat bed without using bedrails?: None Help needed moving from lying on your back to sitting on the side of a flat bed without using bedrails?: None Help needed moving to and from a bed to a chair (including a wheelchair)?: A Little Help needed standing up from a chair using your arms (e.g., wheelchair or bedside chair)?: A Little Help needed to walk in hospital room?: A Little Help needed climbing 3-5 steps with a railing? : A Lot 6 Click Score: 19    End of Session Equipment Utilized During Treatment: Gait belt;Oxygen Activity Tolerance: Patient limited by fatigue Patient left: in chair;with call bell/phone within reach;with chair alarm set;with family/visitor present Nurse Communication: Mobility status PT Visit Diagnosis: Muscle weakness (generalized) (M62.81)    Time: 8786-7672 PT Time Calculation (min) (ACUTE ONLY): 22 min   Charges:   PT Evaluation $PT Eval Moderate Complexity: 1 Mod          [facility] Matich M,PT Acute Rehab Services [phone]   Alvira Philips 10/20/2021, 11:22 AM

## 2021-10-20 NOTE — Progress Notes (Signed)
Received patient in bed to unit.  Alert and oriented.  Informed consent signed and in chart.   Treatment initiated: 1515 Treatment completed: 1851  Patient hypotensive during treatment. UF goal decreased per provider.  Transported back to the room  Alert, without acute distress. Dressing c/d/I. No s/s of bleeding upon completion of treatment.  Hand-off given to patient's nurse.   Access used: AVF  Access issues: none  Total UF removed: 1800 ml Medication(s) given: none Post HD VS: 98.0, 90, 17, 116/43, 100% 4 L of oxygen  Post HD weight: 62.2 kg    [facility]

## 2021-10-20 NOTE — Progress Notes (Signed)
SATURATION QUALIFICATIONS: (This note is used to comply with regulatory documentation for home oxygen)  Patient Saturations on Room Air at Rest = 88%  Patient Saturations on Room Air while Ambulating = 85%  Patient Saturations on 3 Liters of oxygen while Ambulating = 90%  Please briefly explain why patient needs home oxygen:Pt requiring O2 to keep sats >88%.  Dillian Feig M,PT Acute Rehab Services [phone]

## 2021-10-20 NOTE — Progress Notes (Addendum)
NAME:  Dana Jackson, MRN:  [id number], DOB:  02-Jan-1946, LOS: 1 ADMISSION DATE:  10/19/2021, CONSULTATION DATE:  10/19/21 REFERRING MD:  EDP CHIEF COMPLAINT:  Hypotension and Hypoxia   History of Present Illness:  Dana Jackson is a 76 y.o. female with a past medical history listed below including ESRD on dialysis TTS, lung cancer status post chemo/radiation, recent NSTEMI with three-vessel disease treated with medical management who presents to the emergency department for several weeks of cough who developed a fever and emesis today.  Because of this EMS was called.  Patient did not have any obvious shortness of breath on their initial evaluation.  While ambulating to the stretcher, patient became tachypneic.  She was noted to be hypoxic with sats of 58% on room air.  Improved with nonrebreather.  Patient denied any associated chest pain.  No lower extremity edema.  No known sick contacts. States was at baseline after the last hospitalization but then the cough started. She also has been having chills for the last few days.   Labs show VBG 7.34/53/92 with bicarb 29.3. BNP elevated 639.1. CBC without leukocytosis but slight anemia with MCV of 105.7. LA 2.4. UA with leukocytes and WBC, hgb, RBC.   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  Significant Hospital Events: Including procedures, antibiotic start and stop dates in addition to other pertinent events   10/09-Admitted to ICU Interim History / Subjective:  States doing well.  Review of Systems:   Negative unless stated in the HPI.  Objective   Blood pressure (!) 106/45, pulse 92, temperature (!) 97.3 F (36.3 C), temperature source Oral, resp. rate 15, height 5' (1.524 m), weight 62.9 kg, SpO2 90 %.    FiO2 (%):  [45 %] 45 %    Intake/Output Summary (Last 24 hours) at 10/20/2021 0738 Last data filed at 10/20/2021 0000 Gross per 24 hour  Intake 1732.94 ml  Output 70 ml  Net 1662.94 ml   Filed Weights   10/19/21 0607  Weight: 62.9 kg   Examination: General: NAD HENT: NCAT, MMM, on [place] Lungs: CTAB Cardiovascular: NSR, systolic murmur present greatest in the aortic region III/VI, radial pulses palpated, No LE edema Abdomen: No TTP, normal bowel sounds Extremities: No asymmetry Neuro: alert but oriented to place and person but not time GU: no foley.  Consults  Nephrology Resolved Hospital Problem list    Assessment & Plan:  #Septic Shock 2/2 to HAP #Acute Hypoxic Respiratory Failure 2/2 to HAP #Hx of NSTEMI Shock resolved. Respiratory failure improved today. Levophed off. BP with MAP>65. MRSA negative. UA showed leukocytes and bacteria and RBCs. Urine and blood culture pending; NGTD.  -D/c Vanc and continue Zosyn.  -SLP, PT/OT eval and transfer to med tele.   #ESRD on HD TThSat #Hyponatremia Chronic. Nephrology consulted. Some hyponatremia which appears 2/2 to LR and oligoria in setting of ESRD.   #Anemia Multifactorial. Hgb at 10.8. MCV at 105.7. Pt has ESRD and hgb is at goal. Given MCV will get VB12 and Folate.  -Follow up VB12 and folate  #Hypertension  Chronic. Will hold home meds given problem 1.  -Restart home meds when able.   #History of stroke/TIA  #Hyperlipidemia Chronic. Continue Aspirin, Plavix and Lipitor.    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  Continue outpatient follow up.    #DM 2  Chronic. A1c 5.1 10/2021. CTM.   Best Practice (right click and "Reselect all SmartList Selections" daily)  Diet/type: NPO with meds DVT prophylaxis: prophylactic heparin  GI prophylaxis: PPI Lines: N/A Foley:  N/A Continuous: None  Code Status:  full code Last date of multidisciplinary goals of care discussion [10/19/21] Labs   CBC: Recent  Labs  Lab 10/14/21 0216 10/19/21 0617 10/19/21 4696 10/19/21 0732 10/19/21 1323 10/19/21 1334 10/20/21 0619  WBC 4.8 5.3  --   --   --   --  9.1  NEUTROABS  --  4.8  --   --   --   --   --   HGB 9.7* 10.8* 10.9*  10.2* 10.2* 9.2* 12.2 8.7*  HCT 28.4* 31.5* 32.0*  30.0* 30.0* 27.0* 36.0 25.0*  MCV 106.4* 105.7*  --   --   --   --  105.0*  PLT 156 159  --   --   --   --  295*   Basic Metabolic Panel: Recent Labs  Lab 10/14/21 0216 10/19/21 0617 10/19/21 0635 10/19/21 0732 10/19/21 1323 10/19/21 1334 10/20/21 0619  NA 137 135 136  134* 135 132* 132* 130*  K 4.0 3.8 3.9  3.9 3.8 3.8 3.8 4.1  CL 94* 97* 96* 96*  --   --  97*  CO2 31 28  --   --   --   --  21*  GLUCOSE 105* 114* 112* 107*  --   --  71  BUN 27* 33* 33* 34*  --   --  41*  CREATININE 3.24* 4.77* 4.90* 4.90*  --   --  5.39*  CALCIUM 8.9 8.4*  --   --   --   --  8.3*  MG 2.1  --   --   --   --   --  2.0   GFR: Estimated Creatinine Clearance: 7.4 mL/min (A) (by C-G formula based on SCr of 5.39 mg/dL (H)). Recent Labs  Lab 10/14/21 0216 10/19/21 0617 10/19/21 1306 10/19/21 1512 10/20/21 0619  PROCALCITON  --  4.42  --   --   --   WBC 4.8 5.3  --   --  9.1  LATICACIDVEN  --  2.4* 1.2 1.2  --    Liver Function Tests: Recent Labs  Lab 10/19/21 0617  AST 33  ALT 28  ALKPHOS 101  BILITOT 0.7  PROT 5.8*  ALBUMIN 2.7*   No results for input(s): "LIPASE", "AMYLASE" in the last 168 hours. No results for input(s): "AMMONIA" in the last 168 hours. ABG    Component Value Date/Time   PHART 7.326 (L) 12/25/2010 1843   PCO2ART 40.7 12/25/2010 1843   PO2ART 70.0 (L) 12/25/2010 1843   HCO3 23.0 10/19/2021 1334   TCO2 24 10/19/2021 1334   ACIDBASEDEF 2.0 10/19/2021 1334   O2SAT 80 10/19/2021 1334    Coagulation Profile: Recent Labs  Lab 10/19/21 0617  INR 1.0   Cardiac Enzymes: No results for input(s): "CKTOTAL", "CKMB", "CKMBINDEX", "TROPONINI" in the last 168 hours. HbA1C: Hgb A1c MFr Bld   Date/Time Value Ref Range Status  10/13/2021 05:00 AM 5.1 4.8 - 5.6 % Final    Comment:    (NOTE) Pre diabetes:          5.7%-6.4%  Diabetes:              >6.4%  Glycemic control for   <7.0% adults with  diabetes   04/27/2019 07:04 AM 5.6 4.8 - 5.6 % Final    Comment:    (NOTE) Pre diabetes:          5.7%-6.4% Diabetes:              >6.4% Glycemic control for   <7.0% adults with diabetes    CBG: Recent Labs  Lab 10/13/21 2141 10/14/21 0033 10/14/21 0441 10/14/21 0801 10/19/21 1356  GLUCAP 210* 121* 102* 113* 119*   Past Medical History:  She,  has a past medical history of Anemia, Arthritis, Asthma, Bacteremia due to Escherichia coli (06/2013), CAD in native artery (12/2010), CKD (chronic kidney disease) stage 3, GFR 30-59 ml/min ([place]), Degenerative disc disease, cervical, Diabetes mellitus without complication ([place]), Dyspnea, Endometrial cancer ([place]) (11/2013), GERD (gastroesophageal reflux disease), Hearing loss, Heart murmur, History of asthma, History of blood transfusion, History of pneumonia, History of unstable angina (November/December 2012), Hyperlipidemia LDL goal <70, Hypertension, essential, benign, Neuropathy, Pneumonia (2018), Renal cell carcinoma ([place]) (11/18/2008), S/P CABG x 3 (12/2010), Stroke ([place]), and TIA (transient ischemic attack) (1992 &  2010).  Surgical History:   Past Surgical History:  Procedure Laterality Date   ANTERIOR CERVICAL DECOMP/DISCECTOMY FUSION  10/11/2005   multi-level   AV FISTULA PLACEMENT Left 08/03/2017   Procedure: ARTERIOVENOUS (AV) FISTULA CREATION LEFT ARM;  Surgeon: Rosetta Posner, MD;  Location: [facility];  Service: Vascular;  Laterality: Left;   BRONCHIAL BIOPSY  12/30/2020   Procedure: BRONCHIAL BIOPSIES;  Surgeon: Garner [facility], DO;  Location: [facility];  Service: Pulmonary;;   BRONCHIAL BIOPSY  02/03/2021   Procedure: BRONCHIAL BIOPSIES;  Surgeon: Garner Nash, DO;  Location: [facility];  Service: Pulmonary;;    BRONCHIAL BRUSHINGS  12/30/2020   Procedure: BRONCHIAL BRUSHINGS;  Surgeon: Garner Nash, DO;  Location: [facility];  Service: Pulmonary;;   BRONCHIAL BRUSHINGS  02/03/2021   Procedure: BRONCHIAL BRUSHINGS;  Surgeon: Garner Nash, DO;  Location: [facility];  Service: Pulmonary;;   BRONCHIAL NEEDLE ASPIRATION BIOPSY  12/30/2020   Procedure: BRONCHIAL NEEDLE ASPIRATION BIOPSIES;  Surgeon: Garner Nash, DO;  Location: [facility];  Service: Pulmonary;;   BRONCHIAL NEEDLE ASPIRATION BIOPSY  02/03/2021   Procedure: BRONCHIAL NEEDLE ASPIRATION BIOPSIES;  Surgeon: Garner Nash, DO;  Location: [facility];  Service: Pulmonary;;   CARDIAC CATHETERIZATION  12/24/10   40% left main, 80% mid LAD, 95% ostial circumflex, 80-90% PDA.   CARDIAC CATHETERIZATION N/A 07/01/2014   Procedure: Left Heart Cath and Cors/Grafts Angiography;  Surgeon: Leonie Man, MD;  Location: [facility] IN[facility]ASIVE CV LAB:  For Abn Nuc @ [facility]: Ost LAD 80%, mid LAD 100% after S/P 2, 70% RI (no PCI target), ost-prox Cx 99%, OM1 90%. Ost rPDA 80% (~ pre-CABG), occluded SVG-rPDA, patent LIMA-dLAD, SVG OM; potential culprit for abn Nuc scan = pLAD Dz, small RI 70% or PDA -> med Rx   CAROTID ENDARTERECTOMY Left    CHOLECYSTECTOMY  ~ [place] GRAFT  12/25/2010   Procedure: CORONARY ARTERY BYPASS GRAFTING (CABGX3 - LIMA-LAD, SVG RPL, SVG-Circumflex);  Surgeon: Rexene Alberts, MD;  Location: [facility];  Service: Open Heart Surgery;  Laterality: N/A;   DILATATION & CURETTAGE/HYSTEROSCOPY WITH MYOSURE N/A 11/02/2013   Procedure: DILATATION & CURETTAGE/HYSTEROSCOPY WITH MYOSURE ABLATION;  Surgeon: Allena Katz, MD;  Location: [facility];  Service: Gynecology;  Laterality: N/A;   DOPPLER ECHOCARDIOGRAPHY  12/08/2010   EF =>55%,MILD CONCENTRIC LEFT VENTRICULAR HYPERTROPHY  EYE SURGERY Bilateral    Cataract   IR DIALY SHUNT INTRO NEEDLE/INTRACATH INITIAL W/IMG LEFT Left 04/24/2019   IR FLUORO GUIDE  CV LINE LEFT  04/26/2019   IR US GUIDE VASC ACCESS LEFT  04/24/2019   IR US GUIDE VASC ACCESS LEFT  04/26/2019   LEFT HEART CATH AND CORS/GRAFTS ANGIOGRAPHY N/A 10/13/2021   Procedure: LEFT HEART CATH AND CORS/GRAFTS ANGIOGRAPHY;  Surgeon: Belva Crome, MD;  Location: [facility];  Service: Cardiovascular;  Laterality: N/A;   LEFT HEART CATHETERIZATION WITH CORONARY ANGIOGRAM N/A 12/24/2010   Procedure: LEFT HEART CATHETERIZATION WITH CORONARY ANGIOGRAM;  Surgeon: Leonie Man, MD;  Location[facility];  Service: Cardiovascular;  Laterality: N/A;   NM MYOCAR SINGLE W/SPECT  07/26/2007   EF 79%, LEFT VENT.FUNCTION NORMAL   PARTIAL NEPHRECTOMY Left 11/18/2008   left partial nephrectomy for renal cell CA   PET Myocardial Perfusion Scan  06/13/2014   At [facility]: Moderate size, mild severity completely reversible defect involving the basal anterior, mid anterior and apical anterior segments consistent with ischemia. EF 65% with normal global function.   TONSILLECTOMY     "in college"   TOTAL ABDOMINAL HYSTERECTOMY  November 2015    At [facility]: Robotic procedure with pelvic lymphadenectomy   TRANSTHORACIC ECHOCARDIOGRAM  06/13/2014   At [place]: EF 60-65%. Grade 1 diastolic dysfunction. Mild MR. Aortic sclerosis. Moderate pulmonary hypertension   TUBAL LIGATION  ~ 1984   UPPER GI ENDOSCOPY     VIDEO BRONCHOSCOPY WITH RADIAL ENDOBRONCHIAL ULTRASOUND  12/30/2020   Procedure: RADIAL ENDOBRONCHIAL ULTRASOUND;  Surgeon: Garner Nash, DO;  Location: [facility];  Service: Pulmonary;;   VIDEO BRONCHOSCOPY WITH RADIAL ENDOBRONCHIAL ULTRASOUND  02/03/2021   Procedure: RADIAL ENDOBRONCHIAL ULTRASOUND;  Surgeon: Garner Nash, DO;  Location: [facility];  Service: Pulmonary;;   WISDOM TOOTH EXTRACTION      Social History:   reports that she has never smoked. She has never used smokeless tobacco. She reports that she does not currently use alcohol. She reports that  she does not use drugs.  Family History:  Her family history includes AAA (abdominal aortic aneurysm) in her mother; Coronary artery disease in her brother, father, and sister; GER disease in her mother; Heart disease in her brother and father; Multiple myeloma in her mother; Stroke in her maternal grandmother. There is no history of Heart attack.  Allergies Allergies  Allergen Reactions   Sulfa Antibiotics Other (See Comments)    "AKI"    Erythromycin Itching   Pravastatin Other (See Comments)    Unknown reaction   Simvastatin Other (See Comments)    Reaction not known   Codeine Nausea Only and Other (See Comments)    Tolerate Hycodan   Gemfibrozil Itching    Home Medications  Prior to Admission medications   Medication Sig Start Date End Date Taking? Authorizing Provider  acetaminophen (TYLENOL) 500 MG tablet Take 1,000 mg by mouth every 6 (six) hours as needed for moderate pain.    [provider]  Alpha-Lipoic Acid 600 MG CAPS Take 600 mg by mouth daily.    [provider]  aspirin 81 MG tablet Take 81 mg by mouth at bedtime.    [provider]  atorvastatin (LIPITOR) 80 MG tablet Take 1 tablet (80 mg total) by mouth at bedtime. 04/29/19 10/14/22  [place] Donnamarie Poag, DO  benzonatate (TESSALON) 100 MG capsule Take 100-200 mg by mouth 3 (three) times daily as needed  for cough.    [provider]  calcitRIOL (ROCALTROL) 0.25 MCG capsule Take by mouth.    [provider]  cinacalcet (SENSIPAR) 30 MG tablet Take 30 mg by mouth daily with breakfast. 04/18/20   [provider]  clopidogrel (PLAVIX) 75 MG tablet Take 1 tablet (75 mg total) by mouth daily. 10/15/21 12/14/21  Elodia Florence., MD  cyanocobalamin (,VITAMIN B-12,) 1000 MCG/ML injection Inject 1 mL (1,000 mcg total) into the muscle every 30 (thirty) days for 90 doses. Patient not taking: Reported on 10/13/2021 06/27/19 10/19/26  [place] Donnamarie Poag, DO  Darbepoetin Alfa (ARANESP) 60  MCG/0.3ML SOSY injection Inject 0.3 mLs (60 mcg total) into the vein every Tuesday with hemodialysis. 05/01/19   [place] Donnamarie Poag, DO  estradiol (ESTRACE) 0.1 MG/GM vaginal cream Place 1 Applicatorful vaginally 3 (three) times a week.    [provider]  famotidine (PEPCID) 20 MG tablet Take 1 tablet (20 mg total) by mouth at bedtime. 04/29/19 10/14/22  [place] Donnamarie Poag, DO  furosemide (LASIX) 20 MG tablet Take 20 mg by mouth 2 (two) times a week. Tues and Saturday night    [provider]  gabapentin (NEURONTIN) 100 MG capsule Take 100 mg by mouth 2 (two) times daily.    [provider]  iron sucrose in sodium chloride 0.9 % 100 mL Iron Sucrose (Venofer) 02/05/21 02/04/22  [provider]  isosorbide mononitrate (IMDUR) 60 MG 24 hr tablet TAKE 1 TABLET BY MOUTH EVERY DAY Patient taking differently: Take 60 mg by mouth at bedtime. 02/02/21   Leonie Man, MD  lidocaine-prilocaine (EMLA) cream Apply 1 application topically Every Tuesday,Thursday,and Saturday with dialysis. 12/20/19   [provider]  loperamide (IMODIUM A-D) 2 MG tablet Take 4 mg by mouth as needed for diarrhea or loose stools. 03/18/20   [provider]  loratadine (CLARITIN) 10 MG tablet Take 10 mg by mouth daily.    [provider]  magnesium oxide (MAG-OX) 400 MG tablet Take 400 mg by mouth daily.    [provider]  Methoxy PEG-Epoetin Beta (MIRCERA IJ) Mircera 02/26/21 02/25/22  [provider]  metoprolol tartrate (LOPRESSOR) 50 MG tablet Take 1 tablet (50 mg total) by mouth 2 (two) times daily. schedule an appointment for further refills, 1st attempt 09/28/21   Leonie Man, MD  nitroGLYCERIN (NITROSTAT) 0.4 MG SL tablet PLACE 1 TABLET UNDER THE TONGUE EVERY 5 MINUTES AS NEEDED FOR CHEST PAIN. 10/15/21   Leonie Man, MD  Omega-3 Fatty Acids (FISH OIL) 1200 MG CAPS Take 2,400 mg by mouth in the morning and at bedtime.    [provider]   omeprazole (PRILOSEC) 40 MG capsule Take 40 mg by mouth daily before breakfast.    [provider]  Probiotic Product (ALIGN) 4 MG CAPS Take 4 mg by mouth daily.    [provider]  promethazine-dextromethorphan (PROMETHAZINE-DM) 6.25-15 MG/5ML syrup Take 5 mLs by mouth 4 (four) times daily as needed for cough. Patient not taking: Reported on 10/13/2021 09/28/21   [provider]  pyridOXINE (VITAMIN B-6) 100 MG tablet Take 200 mg by mouth daily.     [provider]  sevelamer carbonate (RENVELA) 800 MG tablet Take 800 mg by mouth See admin instructions. Take 800 mg with each meal and each snack 09/16/19   [provider]    Idamae Schuller, MD Tillie Rung. [facility] Internal Medicine Residency, PGY-2

## 2021-10-20 NOTE — Evaluation (Signed)
Clinical/Bedside Swallow Evaluation Patient Details  Name: Dana Jackson MRN: [id number] Date of Birth: 07/06/1945  Today's Date: 10/20/2021 Time: SLP Start Time (ACUTE ONLY): 4098 SLP Stop Time (ACUTE ONLY): 1050 SLP Time Calculation (min) (ACUTE ONLY): 15 min  Past Medical History:  Past Medical History:  Diagnosis Date   Anemia    Arthritis    hands   Asthma    childhood   Bacteremia due to Escherichia coli 06/2013   Currently being treated with anti-biotics   CAD in native artery 12/2010   a) Cath for exertional angina & EKG changes: 40% LM, 80% mid LAD.  95% ost cX, 80-90% PDA --> CABG X3; b) Post CABG CATH for + Myoview with basal anterior ischemia -> Ost LAD 80% (diffuse) then 100% after SP2, RI 70% (too small for PCI), Ost-prox Cx 99% & OM1 90%, OstrPDA 80% (small); Occluded SVG-rPDA.  Patent LIMA-dLAD, SVG-OM: culprit ~ p-m LAD pre-LIMA & RI - not good PCI target --> Med Rx   CKD (chronic kidney disease) stage 3, GFR 30-59 ml/min (HCC)    Degenerative disc disease, cervical    Degenerative disc disease, cervical [722.4]   Diabetes mellitus without complication (HCC)    Type II   Dyspnea    Endometrial cancer ([place]) 11/2013   Treated with TAH with pelvic lymphadenectomy followed by radiation and chemotherapy   GERD (gastroesophageal reflux disease)    Hearing loss    bilateral - no hearing aids   Heart murmur    History of asthma     childhood   History of blood transfusion    History of pneumonia    "2-3 times"   History of unstable angina November/December 2012   T wave inversions in inferolateral leads.  No stress test performed.   Hyperlipidemia LDL goal <70    Hypertension, essential, benign    Neuropathy    hands   Pneumonia 2018   Renal cell carcinoma ([place]) 11/18/2008   T2aNx s/p partial left nephrectomy   S/P CABG x 3 12/2010   LIMA-LAD, SVG RPL, SVG-Circumflex   Stroke ([place])    TIA- no residual    TIA (transient ischemic attack) 1992 &  2010    Past Surgical History:  Past Surgical History:  Procedure Laterality Date   ANTERIOR CERVICAL DECOMP/DISCECTOMY FUSION  10/11/2005   multi-level   AV FISTULA PLACEMENT Left 08/03/2017   Procedure: ARTERIOVENOUS (AV) FISTULA CREATION LEFT ARM;  Surgeon: Rosetta Posner, MD;  Location: [facility];  Service: Vascular;  Laterality: Left;   BRONCHIAL BIOPSY  12/30/2020   Procedure: BRONCHIAL BIOPSIES;  Surgeon: Garner Nash, DO;  Location: [facility];  Service: Pulmonary;;   BRONCHIAL BIOPSY  02/03/2021   Procedure: BRONCHIAL BIOPSIES;  Surgeon: Garner Nash, DO;  Location: [facility];  Service: Pulmonary;;   BRONCHIAL BRUSHINGS  12/30/2020   Procedure: BRONCHIAL BRUSHINGS;  Surgeon: Garner Nash, DO;  Location: [facility];  Service: Pulmonary;;   BRONCHIAL BRUSHINGS  02/03/2021   Procedure: BRONCHIAL BRUSHINGS;  Surgeon: Garner Nash, DO;  Location: [facility];  Service: Pulmonary;;   BRONCHIAL NEEDLE ASPIRATION BIOPSY  12/30/2020   Procedure: BRONCHIAL NEEDLE ASPIRATION BIOPSIES;  Surgeon: Garner Nash, DO;  Location: [facility];  Service: Pulmonary;;   BRONCHIAL NEEDLE ASPIRATION BIOPSY  02/03/2021   Procedure: BRONCHIAL NEEDLE ASPIRATION BIOPSIES;  Surgeon: Garner Nash, DO;  Location: [facility];  Service: Pulmonary;;   CARDIAC CATHETERIZATION  12/24/10   40% left main, 80% mid LAD,  95% ostial circumflex, 80-90% PDA.   CARDIAC CATHETERIZATION N/A 07/01/2014   Procedure: Left Heart Cath and Cors/Grafts Angiography;  Surgeon: Leonie Man, MD;  Location: [facility]:  For Abn Nuc @ [facility]: Ost LAD 80%, mid LAD 100% after S/P 2, 70% RI (no PCI target), ost-prox Cx 99%, OM1 90%. Ost rPDA 80% (~ pre-CABG), occluded SVG-rPDA, patent LIMA-dLAD, SVG OM; potential culprit for abn Nuc scan = pLAD Dz, small RI 70% or PDA -> med Rx   CAROTID ENDARTERECTOMY Left    CHOLECYSTECTOMY  ~ [place] GRAFT  12/25/2010   Procedure: CORONARY  ARTERY BYPASS GRAFTING (CABGX3 - LIMA-LAD, SVG RPL, SVG-Circumflex);  Surgeon: Rexene Alberts, MD;  Location: [facility];  Service: Open Heart Surgery;  Laterality: N/A;   DILATATION & CURETTAGE/HYSTEROSCOPY WITH MYOSURE N/A 11/02/2013   Procedure: DILATATION & CURETTAGE/HYSTEROSCOPY WITH MYOSURE ABLATION;  Surgeon: Allena Katz, MD;  Location: [facility];  Service: Gynecology;  Laterality: N/A;   DOPPLER ECHOCARDIOGRAPHY  12/08/2010   EF =>55%,MILD CONCENTRIC LEFT VENTRICULAR HYPERTROPHY   EYE SURGERY Bilateral    Cataract   IR DIALY SHUNT INTRO NEEDLE/INTRACATH INITIAL W/IMG LEFT Left 04/24/2019   IR FLUORO GUIDE CV LINE LEFT  04/26/2019   IR US GUIDE VASC ACCESS LEFT  04/24/2019   IR US GUIDE VASC ACCESS LEFT  04/26/2019   LEFT HEART CATH AND CORS/GRAFTS ANGIOGRAPHY N/A 10/13/2021   Procedure: LEFT HEART CATH AND CORS/GRAFTS ANGIOGRAPHY;  Surgeon: Belva Crome, MD;  Location: [facility];  Service: Cardiovascular;  Laterality: N/A;   LEFT HEART CATHETERIZATION WITH CORONARY ANGIOGRAM N/A 12/24/2010   Procedure: LEFT HEART CATHETERIZATION WITH CORONARY ANGIOGRAM;  Surgeon: Leonie Man, MD;  Location: [facility] C[facility];  Service: Cardiovascular;  Laterality: N/A;   NM MYOCAR SINGLE W/SPECT  07/26/2007   EF 79%, LEFT VENT.FUNCTION NORMAL   PARTIAL NEPHRECTOMY Left 11/18/2008   left partial nephrectomy for renal cell CA   PET Myocardial Perfusion Scan  06/13/2014   At [facility]: Moderate size, mild severity completely reversible defect involving the basal anterior, mid anterior and apical anterior segments consistent with ischemia. EF 65% with normal global function.   TONSILLECTOMY     "in college"   TOTAL ABDOMINAL HYSTERECTOMY  November 2015    At [facility]: Robotic procedure with pelvic lymphadenectomy   TRANSTHORACIC ECHOCARDIOGRAM  06/13/2014   At [place]: EF 60-65%. Grade 1 diastolic dysfunction. Mild MR. Aortic sclerosis. Moderate pulmonary hypertension   TUBAL  LIGATION  ~ 1984   UPPER GI ENDOSCOPY     VIDEO BRONCHOSCOPY WITH RADIAL ENDOBRONCHIAL ULTRASOUND  12/30/2020   Procedure: RADIAL ENDOBRONCHIAL ULTRASOUND;  Surgeon: Garner Nash, DO;  Location: [facility];  Service: Pulmonary;;   VIDEO BRONCHOSCOPY WITH RADIAL ENDOBRONCHIAL ULTRASOUND  02/03/2021   Procedure: RADIAL ENDOBRONCHIAL ULTRASOUND;  Surgeon: Garner Nash, DO;  Location: [facility];  Service: Pulmonary;;   WISDOM TOOTH EXTRACTION     HPI:  Patient is a 76 y.o. 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 She was admitted on 10/19/21 to ICU after presenting to the ED with c/o several weeks cough and developing fever and emesis. Patient found to be hypoxic with sats of 58% on RA and was placed on NRB.  Assessment / Plan / Recommendation  Clinical Impression  Patient is not currently presenting with any clinical s/s of dysphagia as per this bedside/clinical swallow evaluation. When SLP arrived, patient was sitting in recliner chair and was awake, alert and eager to be able to eat and drink.. She did exhibit some word finding errors in unstructured conversation. SLP set her up to brush teeth which she then did without any assistance. No overt s/s aspiration or penetration observed with sips of thin liquids or puree solids. Patient's RR and oxygen saturations remained WFL (Sp02 in range of 93-94%) during and after PO intake. She appears safe to remain on regular texture solids, thin liquids. SLP not recommending further skilled intervention. SLP Visit Diagnosis: Dysphagia, unspecified (R13.10)    Aspiration Risk  No limitations    Diet Recommendation Regular;Thin liquid   Medication Administration: Whole meds with liquid Supervision: Patient able to self feed Compensations: Slow rate;Small  sips/bites Postural Changes: Seated upright at 90 degrees    Other  Recommendations Oral Care Recommendations: Oral care BID    Recommendations for follow up therapy are one component of a multi-disciplinary discharge planning process, led by the attending physician.  Recommendations may be updated based on patient status, additional functional criteria and insurance authorization.  Follow up Recommendations No SLP follow up      Assistance Recommended at Discharge PRN  Functional Status Assessment Patient has not had a recent decline in their functional status  Frequency and Duration   N/A         Prognosis Prognosis for Safe Diet Advancement: Good      Swallow Study   General Date of Onset: 10/13/21 HPI: Patient is a 76 y.o. 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 She was admitted on 10/19/21 to ICU after presenting to the ED with c/o several weeks cough and developing fever and emesis. Patient found to be hypoxic with sats of 58% on RA and was placed on NRB. Type of Study: Bedside Swallow Evaluation Previous Swallow Assessment: during recent, previous admission: BSE with no f/u Diet Prior to this Study: Regular;Thin liquids Temperature Spikes Noted: No Respiratory Status: Nasal cannula History of Recent Intubation: No Behavior/Cognition: Cooperative;Alert;Pleasant mood Oral Cavity Assessment: Dry Oral Care Completed by SLP: Yes Oral Cavity - Dentition: Adequate natural dentition Vision: Functional for self-feeding Self-Feeding Abilities: Able to feed self Patient Positioning: Upright in chair Baseline Vocal Quality: Normal Volitional Cough: Strong Volitional Swallow: Able to elicit    Oral/Motor/Sensory Function Overall Oral Motor/Sensory Function: Within functional limits   Ice Chips     Thin  Liquid Thin Liquid: Within functional limits Presentation: Straw;Self Fed    Nectar Thick     Honey Thick     Puree Puree: Not tested   Solid     Solid: Within functional limits Presentation: [place], MA, CCC-SLP Speech Therapy

## 2021-10-20 NOTE — Procedures (Signed)
   I was present at this dialysis session, have reviewed the session itself and made  appropriate changes Kelly Splinter MD [place] pager [phone]   10/20/2021, 3:27 PM

## 2021-10-20 NOTE — Consult Note (Signed)
Renal Service Consult Note [facility]  Dana Jackson 10/20/2021 Sol Blazing, MD Requesting Physician: Dr. Charlsie Quest  Reason for Consult: ESRD pt w/ HCAP HPI: The patient is a 76 y.o. year-old w/ hx of anemia, DJD, CAD sp CABG, ESRD on HD, hx endometrial cancer, HOH, HTN, HL, hx RCC, h/o CVA who presented w/ c/o persistent cough along w/ fever/ chills for a few days and emesis x 24 hrs. EMS noted w/ movement her sats dropped to 58%, improved w/ NRB mask. No LE edema, no sick contacts. In ED bnp was 639, LA 2.4, UA +leuk/ wBC, rbc's. BP's were low. Pt rec'd IV abx and some IVF's. Overnight shock/ hypotension has improved and is off levo gtt. Now HR 114, RR 16- 25, BP 118/ 58.  UCx pending, +GNR >100k. Blood cx's negative. Pt admitted, asked to see her for ESRD.    Pt seen in ICU. [facility]. Last admit was 1 -2 wks ago for chest pain/ NSTEMI.  LHC done which showed severe CAD (h/o cabg) and no sites for revascularization, medical Rx suggested.   No HD issues, last HD was Sat as outpatient.   ROS - denies CP, no joint pain, no HA, no blurry vision, no rash, no diarrhea, no nausea/ vomiting, no dysuria, no difficulty voiding   Past Medical History  Past Medical History:  Diagnosis Date   Anemia    Arthritis    hands   Asthma    childhood   Bacteremia due to Escherichia coli 06/2013   Currently being treated with anti-biotics   CAD in native artery 12/2010   a) Cath for exertional angina & EKG changes: 40% LM, 80% mid LAD.  95% ost cX, 80-90% PDA --> CABG X3; b) Post CABG CATH for + Myoview with basal anterior ischemia -> Ost LAD 80% (diffuse) then 100% after SP2, RI 70% (too small for PCI), Ost-prox Cx 99% & OM1 90%, OstrPDA 80% (small); Occluded SVG-rPDA.  Patent LIMA-dLAD, SVG-OM: culprit ~ p-m LAD pre-LIMA & RI - not good PCI target --> Med Rx   CKD (chronic kidney disease) stage 3, GFR 30-59 ml/min (HCC)    Degenerative disc disease, cervical     Degenerative disc disease, cervical [722.4]   Diabetes mellitus without complication (HCC)    Type II   Dyspnea    Endometrial cancer ([place]) 11/2013   Treated with TAH with pelvic lymphadenectomy followed by radiation and chemotherapy   GERD (gastroesophageal reflux disease)    Hearing loss    bilateral - no hearing aids   Heart murmur    History of asthma     childhood   History of blood transfusion    History of pneumonia    "2-3 times"   History of unstable angina November/December 2012   T wave inversions in inferolateral leads.  No stress test performed.   Hyperlipidemia LDL goal <70    Hypertension, essential, benign    Neuropathy    hands   Pneumonia 2018   Renal cell carcinoma ([place]) 11/18/2008   T2aNx s/p partial left nephrectomy   S/P CABG x 3 12/2010   LIMA-LAD, SVG RPL, SVG-Circumflex   Stroke ([place])    TIA- no residual    TIA (transient ischemic attack) 1992 &  2010   Past Surgical History  Past Surgical History:  Procedure Laterality Date   ANTERIOR CERVICAL DECOMP/DISCECTOMY FUSION  10/11/2005   multi-level   AV FISTULA PLACEMENT Left 08/03/2017  Procedure: ARTERIOVENOUS (AV) FISTULA CREATION LEFT ARM;  Surgeon: Rosetta Posner, MD;  Location: [place];  Service: Vascular;  Laterality: Left;   BRONCHIAL BIOPSY  12/30/2020   Procedure: BRONCHIAL BIOPSIES;  Surgeon: Garner Nash, DO;  Location: [place] [facility];  Service: Pulmonary;;   BRONCHIAL BIOPSY  02/03/2021   Procedure: BRONCHIAL BIOPSIES;  Surgeon: Garner Nash, DO;  Location: [place] [facility];  Service: Pulmonary;;   BRONCHIAL BRUSHINGS  12/30/2020   Procedure: BRONCHIAL BRUSHINGS;  Surgeon: Garner Nash, DO;  Location: [place] [facility];  Service: Pulmonary;;   BRONCHIAL BRUSHINGS  02/03/2021   Procedure: BRONCHIAL BRUSHINGS;  Surgeon: Garner Nash, DO;  Location: [place];  Service: Pulmonary;;   BRONCHIAL NEEDLE ASPIRATION BIOPSY  12/30/2020   Procedure: BRONCHIAL NEEDLE ASPIRATION BIOPSIES;   Surgeon: Garner Nash, DO;  Location: [place];  Service: Pulmonary;;   BRONCHIAL NEEDLE ASPIRATION BIOPSY  02/03/2021   Procedure: BRONCHIAL NEEDLE ASPIRATION BIOPSIES;  Surgeon: Garner Nash, DO;  Location: [place];  Service: Pulmonary;;   CARDIAC CATHETERIZATION  12/24/10   40% left main, 80% mid LAD, 95% ostial circumflex, 80-90% PDA.   CARDIAC CATHETERIZATION N/A 07/01/2014   Procedure: Left Heart Cath and Cors/Grafts Angiography;  Surgeon: Leonie Man, MD;  [facility]: [facility] CV LAB:  For Abn Nuc @ [facility]: Ost LAD 80%, mid LAD 100% after S/P 2, 70% RI (no PCI target), ost-prox Cx 99%, OM1 90%. Ost rPDA 80% (~ pre-CABG), occluded SVG-rPDA, patent LIMA-dLAD, SVG OM; potential culprit for abn Nuc scan = pLAD Dz, small RI 70% or PDA -> med Rx   CAROTID ENDARTERECTOMY Left    CHOLECYSTECTOMY  ~ [place] GRAFT  12/25/2010   Procedure: CORONARY ARTERY BYPASS GRAFTING (CABGX3 - LIMA-LAD, SVG RPL, SVG-Circumflex);  Surgeon: Rexene Alberts, MD;  Location: [place];  Service: Open Heart Surgery;  Laterality: N/A;   DILATATION & CURETTAGE/HYSTEROSCOPY WITH MYOSURE N/A 11/02/2013   Procedure: DILATATION & CURETTAGE/HYSTEROSCOPY WITH MYOSURE ABLATION;  Surgeon: Allena Katz, MD;  Location: [facility];  Service: Gynecology;  Laterality: N/A;   DOPPLER ECHOCARDIOGRAPHY  12/08/2010   EF =>55%,MILD CONCENTRIC LEFT VENTRICULAR HYPERTROPHY   EYE SURGERY Bilateral    Cataract   IR DIALY SHUNT INTRO NEEDLE/INTRACATH INITIAL W/IMG LEFT Left 04/24/2019   IR FLUORO GUIDE CV LINE LEFT  04/26/2019   IR US GUIDE VASC ACCESS LEFT  04/24/2019   IR US GUIDE VASC ACCESS LEFT  04/26/2019   LEFT HEART CATH AND CORS/GRAFTS ANGIOGRAPHY N/A 10/13/2021   Procedure: LEFT HEART CATH AND CORS/GRAFTS ANGIOGRAPHY;  Surgeon: Belva Crome, MD;  Location: [place] [facility];  Service: Cardiovascular;  Laterality: N/A;   LEFT HEART CATHETERIZATION WITH CORONARY ANGIOGRAM N/A  12/24/2010   Procedure: LEFT HEART CATHETERIZATION WITH CORONARY ANGIOGRAM;  Surgeon: Leonie Man, MD;  Location: [facility];  Service: Cardiovascular;  Laterality: N/A;   NM MYOCAR SINGLE W/SPECT  07/26/2007   EF 79%, LEFT VENT.FUNCTION NORMAL   PARTIAL NEPHRECTOMY Left 11/18/2008   left partial nephrectomy for renal cell CA   PET Myocardial Perfusion Scan  06/13/2014   At [place]: Moderate size, mild severity completely reversible defect involving the basal anterior, mid anterior and apical anterior segments consistent with ischemia. EF 65% with normal global function.   TONSILLECTOMY     "in college"   TOTAL ABDOMINAL HYSTERECTOMY  November 2015    At [facility]: Robotic procedure  with pelvic lymphadenectomy   TRANSTHORACIC ECHOCARDIOGRAM  06/13/2014   At [place]: EF 60-65%. Grade 1 diastolic dysfunction. Mild MR. Aortic sclerosis. Moderate pulmonary hypertension   TUBAL LIGATION  ~ 1984   UPPER GI ENDOSCOPY     VIDEO BRONCHOSCOPY WITH RADIAL ENDOBRONCHIAL ULTRASOUND  12/30/2020   Procedure: RADIAL ENDOBRONCHIAL ULTRASOUND;  Surgeon: Garner Nash, DO;  Location: [place];  Service: Pulmonary;;   VIDEO BRONCHOSCOPY WITH RADIAL ENDOBRONCHIAL ULTRASOUND  02/03/2021   Procedure: RADIAL ENDOBRONCHIAL ULTRASOUND;  Surgeon: Garner Nash, DO;  Location: [facility];  Service: Pulmonary;;   WISDOM TOOTH EXTRACTION     Family History  Family History  Problem Relation Age of Onset   AAA (abdominal aortic aneurysm) Mother    Multiple myeloma Mother    GER disease Mother    Coronary artery disease Father    Heart disease Father    Coronary artery disease Sister    Coronary artery disease Brother    Heart disease Brother    Stroke Maternal Grandmother    Heart attack Neg Hx    Social History  reports that she has never smoked. She has never used smokeless tobacco. She reports that she does not currently use alcohol. She reports that she does not use  drugs. Allergies  Allergies  Allergen Reactions   Sulfa Antibiotics Other (See Comments)    "AKI"    Erythromycin Itching   Pravastatin Other (See Comments)    Unknown reaction   Simvastatin Other (See Comments)    Reaction not known   Codeine Nausea Only and Other (See Comments)    Tolerate Hycodan   Gemfibrozil Itching   Home medications Prior to Admission medications   Medication Sig Start Date End Date Taking? Authorizing Provider  acetaminophen (TYLENOL) 500 MG tablet Take 1,000 mg by mouth every 6 (six) hours as needed for moderate pain.   Yes [provider]  Alpha-Lipoic Acid 600 MG CAPS Take 600 mg by mouth daily.   Yes [provider]  aspirin 81 MG tablet Take 81 mg by mouth at bedtime.   Yes [provider]  atorvastatin (LIPITOR) 80 MG tablet Take 1 tablet (80 mg total) by mouth at bedtime. 04/29/19 10/14/22 Yes [place] Eric J, DO  benzonatate (TESSALON) 100 MG capsule Take 100-200 mg by mouth 3 (three) times daily as needed for cough.   Yes [provider]  calcitRIOL (ROCALTROL) 0.25 MCG capsule Take by mouth.   Yes [provider]  cinacalcet (SENSIPAR) 30 MG tablet Take 30 mg by mouth daily with breakfast. 04/18/20  Yes [provider]  cinacalcet (SENSIPAR) 30 MG tablet Take 30 mg by mouth daily with breakfast. 10/16/21  Yes [provider]  clopidogrel (PLAVIX) 75 MG tablet Take 1 tablet (75 mg total) by mouth daily. 10/15/21 12/14/21 Yes Elodia Florence., MD  clopidogrel (PLAVIX) 75 MG tablet Take by mouth. 10/16/21  Yes [provider]  cyanocobalamin (,VITAMIN B-12,) 1000 MCG/ML injection Inject 1 mL (1,000 mcg total) into the muscle every 30 (thirty) days for 90 doses. 06/27/19 10/19/26 Yes [place] Donnamarie Poag, DO  Darbepoetin Alfa (ARANESP) 60 MCG/0.3ML SOSY injection Inject 0.3 mLs (60 mcg total) into the vein every Tuesday with hemodialysis. 05/01/19  Yes [place] Eric J, DO  estradiol (ESTRACE)  0.1 MG/GM vaginal cream Place 1 Applicatorful vaginally 3 (three) times a week.   Yes [provider]  famotidine (PEPCID) 20 MG tablet Take 1 tablet (20 mg total) by mouth  at bedtime. 04/29/19 10/14/22 Yes [place] Eric J, DO  furosemide (LASIX) 20 MG tablet Take 20 mg by mouth 2 (two) times a week. Tues and Saturday night   Yes [provider]  gabapentin (NEURONTIN) 100 MG capsule Take 100 mg by mouth 2 (two) times daily.   Yes [provider]  gabapentin (NEURONTIN) 100 MG capsule Take 100 mg by mouth 2 (two) times daily. 10/16/21  Yes [provider]  iron sucrose in sodium chloride 0.9 % 100 mL Iron Sucrose (Venofer) 02/05/21 02/04/22 Yes [provider]  iron sucrose in sodium chloride 0.9 % 100 mL Iron Sucrose (Venofer) 10/15/21 10/14/22 Yes [provider]  isosorbide mononitrate (IMDUR) 60 MG 24 hr tablet TAKE 1 TABLET BY MOUTH EVERY DAY Patient taking differently: Take 60 mg by mouth at bedtime. 02/02/21  Yes Leonie Man, MD  lidocaine-prilocaine (EMLA) cream Apply 1 application topically Every Tuesday,Thursday,and Saturday with dialysis. 12/20/19  Yes [provider]  loperamide (IMODIUM A-D) 2 MG tablet Take 4 mg by mouth as needed for diarrhea or loose stools. 03/18/20  Yes [provider]  LORATADINE PO Take 10 mg by mouth daily.   Yes [provider]  magnesium oxide (MAG-OX) 400 MG tablet Take 400 mg by mouth daily.   Yes [provider]  Methoxy PEG-Epoetin Beta (MIRCERA IJ) Mircera 02/26/21 02/25/22 Yes [provider]  metoprolol tartrate (LOPRESSOR) 50 MG tablet Take 1 tablet (50 mg total) by mouth 2 (two) times daily. schedule an appointment for further refills, 1st attempt 09/28/21  Yes Leonie Man, MD  nitroGLYCERIN (NITROSTAT) 0.4 MG SL tablet PLACE 1 TABLET UNDER THE TONGUE EVERY 5 MINUTES AS NEEDED FOR CHEST PAIN. Patient taking differently: Place 0.4 mg under the tongue every 5  (five) minutes as needed for chest pain. 10/15/21  Yes Leonie Man, MD  Omega-3 Fatty Acids (FISH OIL) 1200 MG CAPS Take 2,400 mg by mouth in the morning and at bedtime.   Yes [provider]  omeprazole (PRILOSEC) 40 MG capsule Take 40 mg by mouth daily before breakfast.   Yes [provider]  Probiotic Product (ALIGN) 4 MG CAPS Take 4 mg by mouth daily.   Yes [provider]  promethazine-dextromethorphan (PROMETHAZINE-DM) 6.25-15 MG/5ML syrup Take 5 mLs by mouth 4 (four) times daily as needed for cough. 09/28/21  Yes [provider]  pyridOXINE (VITAMIN B-6) 100 MG tablet Take 200 mg by mouth daily.    Yes [provider]  sevelamer carbonate (RENVELA) 800 MG tablet Take 800 mg by mouth See admin instructions. Take 800 mg with each meal and each snack 09/16/19  Yes [provider]     Vitals:   10/20/21 0730 10/20/21 0745 10/20/21 0800 10/20/21 0900  BP:      Pulse: 90 92 88 84  Resp: _0 Temp:      TempSrc:      SpO2: 92% (!) 88% 95% 93%  Weight:      Height:       Exam Gen alert, no distress, chronically ill appearing No rash, cyanosis or gangrene Sclera anicteric, throat clear  No jvd or bruits Chest bilat basilar crackles, scattered rhonchi RRR no MRG Abd soft ntnd no mass or ascites +bs GU defer MS no joint effusions or deformity Ext no LE or UE edema, no wounds or ulcers Neuro is alert, Ox 3 , nf, deconditioned    LUA AVF+bruit   Home meds  include - aspirin, atorvastatin, clopidogrel, prns/ vits/ supps   [facility]  400/1.5  62.7kg  2/2 bath  Hep 2000   LUA AVF - Hb 10.3 on 10/5 - mircera 50 ug q2 wks, not started yet - venofer 3m weekly - last HD 10/7 w/ post wt 62.1kg     Assessment/ Plan: Septic shock 2/2 HCAP - w/ fevers, chills , cough. Getting IV abx AHRF - d/t HCAP, +/- vol overload ESRD - on HD TTS. Plan on HD today.  HTN - holding home meds given low BPs. Euvolemic on exam, at  dry wt. CXR w/ some vasc congestion vs infection, CT scan the same. Will try lower vol a bit today w/ HD.  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 Not getting any vdra or sensipar.       Dana Cristabel Bicknell  MD 10/20/2021, 11:21 AM Recent Labs  Lab 10/19/21 0617 10/19/21 0635 10/19/21 0732 10/19/21 1323 10/19/21 1334 10/20/21 0619  HGB 10.8*   < > 10.2*   < > 12.2 8.7*  ALBUMIN 2.7*  --   --   --   --   --   CALCIUM 8.4*  --   --   --   --  8.3*  CREATININE 4.77*   < > 4.90*  --   --  5.39*  K 3.8   < > 3.8   < > 3.8 4.1   < > = values in this interval not displayed.   Inpatient medications:  aspirin EC  81 mg Oral Daily   atorvastatin  80 mg Oral QHS   Chlorhexidine Gluconate Cloth  6 each Topical Daily   clopidogrel  75 mg Oral Daily   heparin  5,000 Units Subcutaneous Q8H   pantoprazole  40 mg Oral Daily    norepinephrine (LEVOPHED) Adult infusion Stopped (10/19/21 2352)   piperacillin-tazobactam (ZOSYN)  IV Stopped (10/20/21 0729)   polyethylene glycol

## 2021-10-21 DIAGNOSIS — A419 Sepsis, unspecified organism: Secondary | ICD-10-CM | POA: Diagnosis not present

## 2021-10-21 DIAGNOSIS — J189 Pneumonia, unspecified organism: Secondary | ICD-10-CM | POA: Diagnosis not present

## 2021-10-21 DIAGNOSIS — R0902 Hypoxemia: Secondary | ICD-10-CM

## 2021-10-21 DIAGNOSIS — R6521 Severe sepsis with septic shock: Secondary | ICD-10-CM | POA: Diagnosis not present

## 2021-10-21 MED ORDER — LIDOCAINE HCL (PF) 1 % IJ SOLN: 5.0000 mL | INTRAMUSCULAR | Status: DC | PRN

## 2021-10-21 MED ORDER — CHLORHEXIDINE GLUCONATE CLOTH 2 % EX PADS
6.0000 | MEDICATED_PAD | Freq: Every day | CUTANEOUS | Status: DC
  Administered 2021-10-22 – 2021-10-23 (×2): 6 via TOPICAL

## 2021-10-21 MED ORDER — IPRATROPIUM-ALBUTEROL 0.5-2.5 (3) MG/3ML IN SOLN: 3.0000 mL | RESPIRATORY_TRACT | Status: DC | PRN

## 2021-10-21 MED ORDER — HEPARIN SODIUM (PORCINE) 1000 UNIT/ML DIALYSIS
2000.0000 [IU] | Freq: Once | INTRAMUSCULAR | Status: DC
  Filled 2021-10-21: qty 2

## 2021-10-21 MED ORDER — ACETAMINOPHEN 325 MG PO TABS: 650.0000 mg | ORAL_TABLET | Freq: Four times a day (QID) | ORAL | Status: DC | PRN

## 2021-10-21 MED ORDER — HEPARIN SODIUM (PORCINE) 1000 UNIT/ML DIALYSIS: 1000.0000 [IU] | INTRAMUSCULAR | Status: DC | PRN

## 2021-10-21 MED ORDER — PENTAFLUOROPROP-TETRAFLUOROETH EX AERO: 1.0000 | INHALATION_SPRAY | CUTANEOUS | Status: DC | PRN

## 2021-10-21 MED ORDER — ALTEPLASE 2 MG IJ SOLR: 2.0000 mg | Freq: Once | INTRAMUSCULAR | Status: DC | PRN

## 2021-10-21 MED ORDER — LIDOCAINE-PRILOCAINE 2.5-2.5 % EX CREA: 1.0000 | TOPICAL_CREAM | CUTANEOUS | Status: DC | PRN

## 2021-10-21 MED ORDER — PANTOPRAZOLE SODIUM 40 MG PO TBEC
40.0000 mg | DELAYED_RELEASE_TABLET | Freq: Every day | ORAL | Status: DC
  Administered 2021-10-22: 40 mg via ORAL
  Filled 2021-10-21 (×2): qty 1

## 2021-10-21 MED ORDER — CYANOCOBALAMIN 1000 MCG/ML IJ SOLN
1000.0000 ug | INTRAMUSCULAR | Status: DC
  Administered 2021-10-21: 1000 ug via INTRAMUSCULAR
  Filled 2021-10-21: qty 1

## 2021-10-21 MED ORDER — ONDANSETRON HCL 4 MG/2ML IJ SOLN: 4.0000 mg | Freq: Four times a day (QID) | INTRAMUSCULAR | Status: DC | PRN

## 2021-10-21 NOTE — Plan of Care (Signed)
  Problem: Education: Goal: Understanding of CV disease, CV risk reduction, and recovery process will improve Outcome: Progressing Goal: Individualized Educational Video(s) Outcome: Progressing   Problem: Activity: Goal: Ability to return to baseline activity level will improve Outcome: Progressing   Problem: Cardiovascular: Goal: Ability to achieve and maintain adequate cardiovascular perfusion will improve Outcome: Progressing Goal: Vascular access site(s) Level 0-1 will be maintained Outcome: Progressing   Problem: Health Behavior/Discharge Planning: Goal: Ability to safely manage health-related needs after discharge will improve Outcome: Progressing   Problem: Education: Goal: Knowledge of cardiac device and self-care will improve Outcome: Progressing Goal: Ability to safely manage health related needs after discharge will improve Outcome: Progressing Goal: Individualized Educational Video(s) Outcome: Progressing   Problem: Cardiac: Goal: Ability to achieve and maintain adequate cardiopulmonary perfusion will improve Outcome: Progressing   Problem: Education: Goal: Knowledge of General Education information will improve Description: Including pain rating scale, medication(s)/side effects and non-pharmacologic comfort measures Outcome: Progressing   Problem: Health Behavior/Discharge Planning: Goal: Ability to manage health-related needs will improve Outcome: Progressing   Problem: Clinical Measurements: Goal: Ability to maintain clinical measurements within normal limits will improve Outcome: Progressing Goal: Will remain free from infection Outcome: Progressing Goal: Diagnostic test results will improve Outcome: Progressing Goal: Respiratory complications will improve Outcome: Progressing Goal: Cardiovascular complication will be avoided Outcome: Progressing   Problem: Activity: Goal: Risk for activity intolerance will decrease Outcome: Progressing    Problem: Nutrition: Goal: Adequate nutrition will be maintained Outcome: Progressing   Problem: Coping: Goal: Level of anxiety will decrease Outcome: Progressing   Problem: Elimination: Goal: Will not experience complications related to bowel motility Outcome: Progressing Goal: Will not experience complications related to urinary retention Outcome: Progressing   Problem: Pain Managment: Goal: General experience of comfort will improve Outcome: Progressing   Problem: Safety: Goal: Ability to remain free from injury will improve Outcome: Progressing   Problem: Skin Integrity: Goal: Risk for impaired skin integrity will decrease Outcome: Progressing

## 2021-10-21 NOTE — Progress Notes (Signed)
Pt receives out-pt HD at [place].Pt arrives at 11:00 for 11:20 chair time. Will assist as needed.   Melven Sartorius Renal Navigator [phone]

## 2021-10-21 NOTE — Progress Notes (Signed)
Mobility Specialist Progress Note:   10/21/21 1216  Mobility  Activity Ambulated with assistance in hallway  Level of Assistance Standby assist, set-up cues, supervision of patient - no hands on  Assistive Device Front wheel walker  Distance Ambulated (ft) 400 ft  Activity Response Tolerated well  Mobility Referral Yes  $Mobility charge 1 Mobility   Pt received in chair and eager. No complaints. Pt left in chair with all needs met, call bell in reach, and husband in room.   Dana Jackson Mobility Specialist-Acute Rehab Secure Chat only

## 2021-10-21 NOTE — Progress Notes (Signed)
[facility] Progress Note   Subjective:    Seen and examined patient at bedside. Seen sitting in recliner. She reports feeling better, still with non-productive cough. She denies SOB, CP, and N/V. Tolerated yesterday's HD with net UF 1.8L. Next HD 10/12.  Objective Vitals:   10/21/21 0400 10/21/21 0500 10/21/21 0800 10/21/21 1131  BP: (!) 123/54  104/62 117/82  Pulse: 94  93 94  Resp: '15  16 20  '$ Temp: 98 F (36.7 C)  97.9 F (36.6 C) 97.9 F (36.6 C)  TempSrc: Oral  Oral Oral  SpO2: 96%  96% 96%  Weight:  61.5 kg    Height:       Physical Exam General: Older female, awake, alert, on 4L O2, NAD Heart: S1 and S2; No MGRs Lungs: Diminished at bases; clear in uppers Abdomen: Soft and non-tender Extremities: No LE edema Dialysis Access: LUE AVF (+) B/T   Filed Weights   10/20/21 1500 10/20/21 1851 10/21/21 0500  Weight: 64.1 kg 62.2 kg 61.5 kg    Intake/Output Summary (Last 24 hours) at 10/21/2021 1343 Last data filed at 10/20/2021 1851 Gross per 24 hour  Intake --  Output 1800 ml  Net -1800 ml    Additional Objective Labs: Basic Metabolic Panel: Recent Labs  Lab 10/19/21 0617 10/19/21 0635 10/19/21 0732 10/19/21 1323 10/19/21 1334 10/20/21 0619  NA 135 136  134* 135 132* 132* 130*  K 3.8 3.9  3.9 3.8 3.8 3.8 4.1  CL 97* 96* 96*  --   --  97*  CO2 28  --   --   --   --  21*  GLUCOSE 114* 112* 107*  --   --  71  BUN 33* 33* 34*  --   --  41*  CREATININE 4.77* 4.90* 4.90*  --   --  5.39*  CALCIUM 8.4*  --   --   --   --  8.3*  PHOS  --   --   --   --   --  3.9   Liver Function Tests: Recent Labs  Lab 10/19/21 0617  AST 33  ALT 28  ALKPHOS 101  BILITOT 0.7  PROT 5.8*  ALBUMIN 2.7*   No results for input(s): "LIPASE", "AMYLASE" in the last 168 hours. CBC: Recent Labs  Lab 10/19/21 0617 10/19/21 0635 10/19/21 1323 10/19/21 1334 10/20/21 0619  WBC 5.3  --   --   --  9.1  NEUTROABS 4.8  --   --   --   --   HGB 10.8*   < >  9.2* 12.2 8.7*  HCT 31.5*   < > 27.0* 36.0 25.0*  MCV 105.7*  --   --   --  105.0*  PLT 159  --   --   --  130*   < > = values in this interval not displayed.   Blood Culture    Component Value Date/Time   SDES BLOOD SITE NOT SPECIFIED 10/19/2021 0617   SPECREQUEST  10/19/2021 0617    BOTTLES DRAWN AEROBIC AND ANAEROBIC Blood Culture results may not be optimal due to an inadequate volume of blood received in culture bottles   CULT  10/19/2021 0617    NO GROWTH 1 DAY Performed at [facility], [place] [address], [place], [place] [address]    REPTSTATUS PENDING 10/19/2021 2542    Cardiac Enzymes: No results for input(s): "CKTOTAL", "CKMB", "CKMBINDEX", "TROPONINI" in the last 168 hours. CBG: Recent Labs  Lab 10/19/21 1356  GLUCAP 119*   Iron Studies: No results for input(s): "IRON", "TIBC", "TRANSFERRIN", "FERRITIN" in the last 72 hours. Lab Results  Component Value Date   INR 1.0 10/19/2021   INR 1.0 03/27/2019   INR 0.96 03/09/2012   Studies/Results: No results found.  Medications:  piperacillin-tazobactam (ZOSYN)  IV 2.25 g (10/21/21 0707)    aspirin EC  81 mg Oral Daily   atorvastatin  80 mg Oral QHS   Chlorhexidine Gluconate Cloth  6 each Topical Q0600   clopidogrel  75 mg Oral Daily   cyanocobalamin  1,000 mcg Intramuscular Q30 days   gabapentin  100 mg Oral BID   heparin  5,000 Units Subcutaneous Q8H   pantoprazole  40 mg Oral Daily   pantoprazole  40 mg Oral Daily    D[facility] Orders: [facility]  400/1.5  62.7kg  2/2 bath  Hep 2000   LUA AVF - Hb 10.3 on 10/5 - mircera 50 ug q2 wks, not started yet - venofer '50mg'$  weekly - last HD 10/7 w/ post wt 62.1kg     Home meds include - aspirin, atorvastatin, clopidogrel, prns/ vits/ supps   Assessment/Plan: Septic shock 2/2 HCAP - w/ fevers, chills , cough. Getting IV Zosyn AHRF - d/t HCAP, +/- vol overload ESRD - on HD TTS. Next HD 10/12.  HTN - holding home meds given low BPs. Euvolemic on exam, at dry  wt. CXR w/ some vasc congestion vs infection, CT scan the same. Will challenge UF if needed.  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 Not getting any vdra or sensipar.   Tobie Poet, NP [facility] 10/21/2021,1:43 PM  LOS: 2 days

## 2021-10-21 NOTE — TOC Initial Note (Addendum)
Transition of Care [facility]) - Initial/Assessment Note    Patient Details  Name: Dana Jackson MRN: [id number] Date of Birth: 09/25/1945  Transition of Care [facility]) CM/SW Contact:    Cyndi Bender, RN Phone Number: 10/21/2021, 12:44 PM  Clinical Narrative:                 Spoke to husband by phone regarding transition needs. Husband states patient has used [facility] in the past for home health and would like to use them again. Cory with [facility] accepted referral. Order for rollator. Husband agreeable to use in house provider. [place] with [facility] notified and rollator will be delivered to the room prior to discharge.  Patient can afford her medications and has transportation to apts.  Address, Phone number and PCP verified.  TOC will continue to follow for needs.  [place] reached out and patient received a walker within the last 5 years. Husband notified.  Will need home health PT orders.  Expected Discharge Plan: [place] Barriers to Discharge: Continued Medical Work up   Patient Goals and CMS Choice Patient states their goals for this hospitalization and ongoing recovery are:: return home CMS Medicare.gov Compare Post Acute Care list provided to:: Patient Represenative (must comment) Choice offered to / list presented to : Spouse  Expected Discharge Plan and Services Expected Discharge Plan: [place]   Discharge Planning Services: CM Consult Post Acute Care Choice: Home Health, Durable Medical Equipment Living arrangements for the past 2 months: Single Family Home                 DME Arranged: Walker rolling with seat DME Agency: [facility]       HH Arranged: PT HH Agency: [facility]        Prior Living Arrangements/Services Living arrangements for the past 2 months: Single Family Home Lives with:: Spouse Patient language and need for interpreter reviewed:: Yes        Need for Family Participation in Patient  Care: Yes (Comment) Care giver support system in place?: Yes (comment) Current home services: DME (cane) Criminal Activity/Legal Involvement Pertinent to Current Situation/Hospitalization: No - Comment as needed  Activities of Daily Living Home Assistive Devices/Equipment: Cane (specify quad or straight), Eyeglasses, Hearing aid, Shower chair without back ADL Screening (condition at time of admission) Patient's cognitive ability adequate to safely complete daily activities?: Yes Is the patient deaf or have difficulty hearing?: No Does the patient have difficulty seeing, even when wearing glasses/contacts?: No Does the patient have difficulty concentrating, remembering, or making decisions?: No Patient able to express need for assistance with ADLs?: Yes Does the patient have difficulty dressing or bathing?: No Independently performs ADLs?: Yes (appropriate for developmental age) Does the patient have difficulty walking or climbing stairs?: No Weakness of Legs: None Weakness of Arms/Hands: None  Permission Sought/Granted                  Emotional Assessment         Alcohol / Substance Use: Not Applicable Psych Involvement: No (comment)  Admission diagnosis:  Hypoxia [R09.02] Septic shock ([place]) [A41.9, R65.21] Fever, unspecified fever cause [R50.9] Urinary tract infection without hematuria, site unspecified [N39.0] Pneumonia of both lower lobes due to infectious organism [J18.9] Sepsis, due to unspecified organism, unspecified whether acute organ dysfunction present [facility]) [A41.9] Patient Active Problem List   Diagnosis Date Noted   Septic shock ([place]) 10/19/2021   Acute respiratory failure with hypoxia (  [place]) 10/13/2021   NSTEMI (non-ST elevated myocardial infarction) ([place]) 10/13/2021   Malignant neoplasm metastatic to both lungs ([place]) 03/27/2021   Carcinoid tumor of right lung 03/04/2021   Carcinoid tumor of left lung 01/07/2021   History of renal cell cancer 01/07/2021    History of endometrial cancer 01/07/2021   Lung nodules 12/12/2020   COVID-19 10/03/2020   Recurrent UTI 09/10/2020   Vitamin B12 deficiency 09/08/2020   Secondary hyperparathyroidism of renal origin ([place]) 04/02/2020   Iron deficiency anemia, unspecified 07/30/2019   Mild protein-calorie malnutrition ([place]) 05/02/2019   End stage renal disease ([place]) 04/30/2019   Personal history of anaphylaxis 04/30/2019   Pruritus, unspecified 04/30/2019   Pain, unspecified 04/30/2019   Coagulation defect, unspecified ([place]) 04/30/2019   ESRD on hemodialysis ([place]) 04/29/2019   CKD (chronic kidney disease) stage 5, GFR less than 15 ml/min (HCC) 03/27/2019   Fever 03/27/2019   Age-related osteoporosis without current pathological fracture 07/12/2018   Pneumonia of both lower lobes due to infectious organism 05/09/2017   Gastroesophageal reflux disease without esophagitis 06/09/2016   Dysuria 06/10/2015   Candidal intertrigo 05/19/2015   Chronic cough 03/21/2015   Coronary artery disease involving native coronary artery of native heart with angina pectoris ([place]) 09/08/2014   Acute-on-chronic kidney injury ([place]) 07/29/2014   Hyperkalemia 07/29/2014   CAD (coronary artery disease) of artery bypass graft 07/29/2014   Vulvar abscess 07/22/2014   Type 2 diabetes mellitus with renal manifestations, controlled ([place]) 07/10/2014   Endometrial cancer ([place]) 07/10/2014   Encounter for follow-up 06/14/2014   Bacteremia 06/13/2014   Encounter for chemotherapy management 12/24/2013   History of TIAs 11/30/2013   Obesity (BMI 30-39.9) 09/03/2012   Hyperlipidemia associated with type 2 diabetes mellitus ([place])    Anemia 12/28/2010   S/P CABG x 3 12/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 [place] [address]Phone: [phone]: [phone] [facility] [address]: [phone]: [phone]    Social Determinants of Health (SDOH) Interventions    Readmission Risk Interventions    04/29/2019    1:48 PM  Readmission Risk Prevention Plan  Transportation Screening Complete  Medication Review ([place] Complete  PCP or Specialist appointment within 3-5 days of discharge Complete  HRI or HBurch[place]Complete  SW Recovery Care/Counseling Consult Complete  PShumwayNot Applicable

## 2021-10-21 NOTE — Evaluation (Signed)
Occupational Therapy Evaluation Patient Details Name: Dana Jackson MRN: [id number] DOB: 07/12/1945 Today's Date: 10/21/2021   History of Present Illness Dana Jackson is a 76 y.o. female admitted 10/9  who presents to the emergency department for several weeks of cough who developed a fever and emesis today. Also desaturation.  Pt found to have sepsis.  PMH:  ESRD on dialysis TTS, lung cancer status post chemo/radiation, recent NSTEMI with three-vessel disease treated with medical management   Clinical Impression   Pt is typically independent in ADL and mobility. Today she is able to perform bed mobility independently, needed min A for LB dressing. (Suspect short stature and feet unable to reach floor from EOB) otherwise min guard for standing grooming at the sink, toilet transfer, peri care and underwear management, and in room mobility.  Hr was 104 bpm during activity, SpO2 sitting without O2 was 90% with activity dropped to 84%, able to bring back up to 90-92 with PLB and seated. Dropped again with ambualtion to recliner, re-aopplied 2L O2 and SpO2 returned to 95% Pt continues to require supplemental oxygen during activity. Pt provided with incentive spirometer along with education and Pt pulling 850 today.      Recommendations for follow up therapy are one component of a multi-disciplinary discharge planning process, led by the attending physician.  Recommendations may be updated based on patient status, additional functional criteria and insurance authorization.   Follow Up Recommendations  No OT follow up    Assistance Recommended at Discharge Intermittent Supervision/Assistance  Patient can return home with the following A little help with walking and/or transfers;A little help with bathing/dressing/bathroom;Assistance with cooking/housework;Assist for transportation;Help with stairs or ramp for entrance    Functional Status Assessment  Patient has had a recent decline in their  functional status and demonstrates the ability to make significant improvements in function in a reasonable and predictable amount of time.  Equipment Recommendations  None recommended by OT (Pt has appropriate DME)    Recommendations for Other Services PT consult     Precautions / Restrictions Precautions Precautions: Fall Precaution Comments: watch SpO2 Restrictions Weight Bearing Restrictions: No      Mobility Bed Mobility Overal bed mobility: Independent                  Transfers Overall transfer level: Needs assistance Equipment used: None Transfers: Sit to/from Stand Sit to Stand: Min guard, Supervision           General transfer comment: Pt provided with steadying assist initially upon standing.      Balance Overall balance assessment: Needs assistance Sitting-balance support: No upper extremity supported, Feet supported Sitting balance-Leahy Scale: Fair     Standing balance support: No upper extremity supported, During functional activity Standing balance-Leahy Scale: Fair Standing balance comment: Pt was able to stand statically without Ue support.                           ADL either performed or assessed with clinical judgement   ADL Overall ADL's : Needs assistance/impaired Eating/Feeding: Modified independent;Sitting   Grooming: Oral care;Wash/dry face;Min guard;Standing Grooming Details (indicate cue type and reason): desturates on RA Upper Body Bathing: Set up;Sitting   Lower Body Bathing: Set up;Sitting/lateral leans   Upper Body Dressing : Modified independent   Lower Body Dressing: Minimal assistance;Sitting/lateral leans Lower Body Dressing Details (indicate cue type and reason): to don R sock from EOB (Pt of short stature and could not  reach floor sitting EOB) Toilet Transfer: Min guard;Ambulation   Toileting- Clothing Manipulation and Hygiene: Supervision/safety;Sit to/from stand Toileting - Clothing Manipulation  Details (indicate cue type and reason): managed peri care, as well as underwear     Functional mobility during ADLs: Min guard General ADL Comments: no SOB, but does desaturate to 85% on RA with activity     Vision Patient Visual Report: No change from baseline Vision Assessment?: No apparent visual deficits     Perception     Praxis      Pertinent Vitals/Pain Pain Assessment Pain Assessment: No/denies pain Faces Pain Scale: No hurt     Hand Dominance Right   Extremity/Trunk Assessment Upper Extremity Assessment Upper Extremity Assessment: Overall WFL for tasks assessed   Lower Extremity Assessment Lower Extremity Assessment: Defer to PT evaluation   Cervical / Trunk Assessment Cervical / Trunk Assessment: Normal   Communication Communication Communication: No difficulties   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 Dropped again with ambualtion to recliner, re-aopplied 2L O2 and SpO2 returned to 95%    Exercises Exercises: Other exercises Other Exercises Other Exercises: provided spirometer, education and able to pull 850 x 5, instructed to perform 3-5 times every 30 min   Shoulder Instructions      Home Living Family/patient expects to be discharged to:: Private residence Living Arrangements: Spouse/significant other Available Help at Discharge: Family;Available PRN/intermittently (husband to start working 25 hours week) Type of Home: House Home Access: Stairs to enter [employer] of Steps: flight Entrance Stairs-Rails: Left Home Layout: Two level;Able to  live on main level with bedroom/bathroom     Bathroom Shower/Tub: Occupational psychologist: Handicapped height     Home Equipment: Shower seat;Grab bars - tub/shower;Hand held shower head;Cane - single point;Rolling Environmental consultant (2 wheels)   Additional Comments: HD T,TH, Sat      Prior Functioning/Environment Prior Level of Function : Independent/Modified Independent             Mobility Comments: didnt use device ADLs Comments: B/D self        OT Problem List: Cardiopulmonary status limiting activity;Decreased activity tolerance;Impaired balance (sitting and/or standing)      OT Treatment/Interventions:      OT Goals(Current goals can be found in the care plan section) Acute Rehab OT Goals Patient Stated Goal: get better and go home OT Goal Formulation: With patient/family Time For Goal Achievement: 11/04/21 Potential to Achieve Goals: Good ADL Goals Pt Will Perform Grooming: with modified independence;standing Pt Will Perform Upper Body Dressing: with modified independence;sitting Pt Will Perform Lower Body Dressing: with modified independence;sit to/from stand Pt Will Transfer to Toilet: with modified independence;ambulating Pt Will Perform Toileting - Clothing Manipulation and hygiene: with modified independence;sit to/from stand Additional ADL Goal #1: Pt will self-monitor SpO2 during ADL maintaining above 90% at independent level Additional ADL Goal #2: Pt will verbalize 3 ways of conserving energy during ADL at independent level  OT Frequency:      Co-evaluation  AM-PAC OT "6 Clicks" Daily Activity     Outcome Measure Help from another person eating meals?: None Help from another person taking care of personal grooming?: A Little Help from another person toileting, which includes using toliet, bedpan, or urinal?: A Little Help from another person bathing (including washing, rinsing, drying)?: A Little Help from another person to put on  and taking off regular upper body clothing?: A Little Help from another person to put on and taking off regular lower body clothing?: A Little 6 Click Score: 19   End of Session Equipment Utilized During Treatment: Oxygen (2L) Nurse Communication: Mobility status (no chair alarm)  Activity Tolerance: Patient tolerated treatment well Patient left: in chair;with call bell/phone within reach;with family/visitor present (no chair alarm, Pt and family verbally confirming not to move around without staff assist)  OT Visit Diagnosis: Muscle weakness (generalized) (M62.81);Other (comment) (cardiopulmonary status limiting activity)                Time: 3818-2993 OT Time Calculation (min): 52 min Charges:  OT General Charges $OT Visit: 1 Visit OT Evaluation $OT Eval Moderate Complexity: 1 Mod OT Treatments $Self Care/Home Management : 23-37 mins  Jesse Sans OTR/L Acute Rehabilitation Services Office: [place] 10/21/2021, 11:21 AM

## 2021-10-21 NOTE — Progress Notes (Addendum)
PROGRESS NOTE        PATIENT DETAILS Name: Dana Jackson Age: 76 y.o. Sex: female Date of Birth: [birth year] Admit Date: 10/19/2021 Admitting Physician Jacky Kindle, MD [id number], Gracelyn Nurse, PA-C  Brief Summary: Patient is a 76 y.o.  female with history of ESRD on HD TTS, recent hospitalization for non-STEMI-who presented to the hospital with fever, tachypnea-was found to have acute hypoxic respiratory failure in the setting of septic shock due to PNA and subsequently admitted to the ICU.  Patient required pressors/empiric antibiotics-stabilized and transferred to [facility] on 10/11.  Significant events: 10/3-10/4>> hospitalization for non-STEMI-LHC with diffuse disease-no targets for revascularization 10/9>> admit to ICU-septic shock/hypoxia 2/2 PNA  Significant studies: 10/9>> CXR: Right> left PNA 10/9>> CTA chest: No PE, multifocal PNA.  Significant microbiology data: 10/9>> COVID PCR: Negative 10/9>> blood culture: Negative 10/9>> urine culture: E. coli/Klebsiella pneumonia (likely colonization-symptoms more consistent with PNA)  Procedures: None  Consults: PCCM Nephrology  Subjective: Lying comfortably in bed-denies any chest pain or shortness of breath.  Objective: Vitals: Blood pressure 104/62, pulse 93, temperature 97.9 F (36.6 C), temperature source Oral, resp. rate 16, height 5' (1.524 m), weight 61.5 kg, SpO2 96 %.   Exam: Gen Exam:Alert awake-not in any distress HEENT:atraumatic, normocephalic Chest: B/L clear to auscultation anteriorly CVS:S1S2 regular Abdomen:soft non tender, non distended Extremities:no edema Neurology: Non focal Skin: no rash  Pertinent Labs/Radiology:    Latest Ref Rng & Units 10/20/2021    6:19 AM 10/19/2021    1:34 PM 10/19/2021    1:23 PM  CBC  WBC 4.0 - 10.5 K/uL 9.1     Hemoglobin 12.0 - 15.0 g/dL 8.7  12.2  9.2   Hematocrit 36.0 - 46.0 % 25.0  36.0  27.0   Platelets 150 - 400 K/uL 130        Lab Results  Component Value Date   NA 130 (L) 10/20/2021   K 4.1 10/20/2021   CL 97 (L) 10/20/2021   CO2 21 (L) 10/20/2021      Assessment/Plan: Septic shock Acute hypoxic respiratory failure Multifocal PNA Sepsis physiology resolved Hypoxia improving on 2 L of oxygen this morning Continue Zosyn SLP eval to ensure no silent aspiration  CAD Recent non-STEMI No anginal symptoms Continue ASA/Plavix/Lipitor  ESRD on HD TTS Nephrology following and directing care  Macrocytic anemia Due to ESRD/acute illness B12 levels borderline-but appears to be on vitamin B12 injections per MAR. 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  BMI: Estimated body mass index is 26.48 kg/m as calculated from the following:   Height as of this encounter: 5' (1.524 m).   Weight as of this encounter: 61.5 kg.   Code status:   Code Status: Full Code   DVT Prophylaxis: heparin injection 5,000 Units Start: 10/19/21 1400   Family Communication: Spouse at bedside   Disposition Plan: Status is: Inpatient Remains inpatient appropriate because: Resolving sepsis physiology-not yet stable for discharge.   Planned Discharge Destination:Home  Diet: Diet Order             Diet Heart Room service appropriate? Yes; Fluid consistency: Thin  Diet effective now                     Antimicrobial agents: Anti-infectives (From admission, onward)    Start     Dose/Rate Route Frequency Ordered Stop   10/19/21 2200   piperacillin-tazobactam (ZOSYN) IVPB 2.25 g        2.25 g 100 mL/hr over 30 Minutes Intravenous Every 8 hours 10/19/21 1910     10/19/21 1908  vancomycin variable dose per unstable renal function (pharmacist dosing)  Status:  Discontinued         Does not apply See admin instructions 10/19/21 1908 10/20/21 0912   10/19/21 0800  ceFEPIme (MAXIPIME) 2 g in sodium chloride 0.9 % 100 mL IVPB        2 g 200 mL/hr over 30 Minutes Intravenous  Once 10/19/21 0754 10/19/21 0819   10/19/21 0800  vancomycin (VANCOREADY) IVPB 1250 mg/250 mL        1,250 mg 166.7 mL/hr over 90 Minutes Intravenous  Once 10/19/21 0759 10/19/21 1026   10/19/21 0759  vancomycin variable dose per unstable renal function (pharmacist dosing)  Status:  Discontinued         Does not apply See admin instructions 10/19/21 0759 10/19/21 1357        MEDICATIONS: Scheduled Meds:  aspirin EC  81 mg Oral Daily   atorvastatin  80 mg Oral QHS   Chlorhexidine Gluconate Cloth  6 each Topical Q0600   clopidogrel  75 mg Oral Daily   gabapentin  100 mg Oral BID   heparin  5,000 Units Subcutaneous Q8H   pantoprazole  40 mg Oral Daily   Continuous Infusions:  norepinephrine (LEVOPHED) Adult infusion Stopped (10/19/21 2352)   piperacillin-tazobactam (ZOSYN)  IV 2.25 g (10/21/21 0707)   PRN Meds:.polyethylene glycol   I have personally reviewed following labs and imaging studies  LABORATORY DATA: CBC: Recent Labs  Lab 10/19/21 0617 10/19/21 0635 10/19/21 0732 10/19/21 1323 10/19/21 1334 10/20/21 0619  WBC 5.3  --   --   --   --  9.1  NEUTROABS 4.8  --   --   --   --   --   HGB 10.8* 10.9*  10.2* 10.2* 9.2* 12.2 8.7*  HCT 31.5* 32.0*  30.0* 30.0* 27.0* 36.0 25.0*  MCV 105.7*  --   --   --   --  105.0*  PLT 159  --   --   --   --  130*    Basic Metabolic Panel: Recent Labs  Lab 10/19/21 0617 10/19/21 0635 10/19/21 0732 10/19/21 1323 10/19/21 1334 10/20/21 0619  NA 135 136  134* 135 132* 132* 130*  K 3.8 3.9   3.9 3.8 3.8 3.8 4.1  CL 97* 96* 96*  --   --  97*  CO2 28  --   --   --   --  21*  GLUCOSE 114* 112* 107*  --   --  71  BUN 33* 33* 34*  --   --  41*  CREATININE 4.77* 4.90* 4.90*  --   --  5.39*  CALCIUM 8.4*  --   --   --   --  8.3*  MG  --   --   --   --   --  2.0  PHOS  --   --   --   --   --  3.9    GFR: Estimated Creatinine Clearance: 7.3 mL/min (A) (by C-G formula based on SCr of 5.39 mg/dL (H)).  Liver Function Tests: Recent Labs  Lab 10/19/21 0617  AST 33  ALT 28  ALKPHOS 101  BILITOT 0.7  PROT 5.8*  ALBUMIN 2.7*   No results for input(s): "LIPASE", "AMYLASE" in the last 168 hours. No results for input(s): "AMMONIA" in the last 168 hours.  Coagulation Profile: Recent Labs  Lab 10/19/21 0617  INR 1.0    Cardiac Enzymes: No results for input(s): "CKTOTAL", "CKMB", "CKMBINDEX", "TROPONINI" in the last 168 hours.  BNP (last 3 results) No results for input(s): "PROBNP" in the last 8760 hours.  Lipid Profile: No results for input(s): "CHOL", "HDL", "LDLCALC", "TRIG", "CHOLHDL", "LDLDIRECT" in the last 72 hours.  Thyroid Function Tests: No results for input(s): "TSH", "T4TOTAL", "FREET4", "T3FREE", "THYROIDAB" in the last 72 hours.  Anemia Panel: Recent Labs    10/19/21 1512 10/20/21 0619  VITAMINB12  --  391  FOLATE 8.1  --     Urine analysis:    Component Value Date/Time   COLORURINE AMBER (A) 10/19/2021 0645   APPEARANCEUR CLOUDY (A) 10/19/2021 0645   LABSPEC 1.009 10/19/2021 0645   PHURINE 8.0 10/19/2021 0645   GLUCOSEU NEGATIVE 10/19/2021 0645   HGBUR MODERATE (A) 10/19/2021 0645   BILIRUBINUR NEGATIVE 10/19/2021 0645   KETONESUR NEGATIVE 10/19/2021 0645   PROTEINUR 100 (A) 10/19/2021 0645   UROBILINOGEN 0.2 06/10/2014 1259   NITRITE NEGATIVE 10/19/2021 0645   LEUKOCYTESUR LARGE (A) 10/19/2021 0645    Sepsis Labs: Lactic Acid, Venous    Component Value Date/Time   LATICACIDVEN 1.2 10/19/2021 1512    MICROBIOLOGY: Recent Results  (from the past 240 hour(s))  Resp Panel by RT-PCR (Flu A&B, Covid) Anterior Nasal Swab     Status: None   Collection Time: 10/13/21  1:58 AM   Specimen: Anterior Nasal Swab  Result Value Ref Range Status   SARS Coronavirus 2 by RT PCR NEGATIVE NEGATIVE Final    Comment: (NOTE) SARS-CoV-2 target nucleic acids are NOT DETECTED.  The SARS-CoV-2 RNA is generally detectable in upper respiratory specimens during the acute phase of infection. The lowest concentration of SARS-CoV-2 viral copies this assay can detect is 138 copies/mL. A negative result does not preclude SARS-Cov-2 infection and should not be used as the sole basis for treatment or other patient management decisions. A negative result may occur with  improper specimen collection/handling, submission of specimen other than nasopharyngeal swab, presence of viral mutation(s) within the areas targeted by this assay, and inadequate number of viral copies(<138 copies/mL). A negative result must be combined with clinical observations, patient history, and epidemiological information. The expected result is Negative.  Fact Sheet for Patients:  EntrepreneurPulse.com.au  Fact Sheet for Healthcare Providers:  IncredibleEmployment.be  This test is no t yet approved or cleared by the [place] FDA and  has been authorized for detection and/or diagnosis of SARS-CoV-2 by FDA under an Emergency Use Authorization (EUA). This EUA will remain  in effect (meaning this test can be used) for the duration of the COVID-19 declaration under Section 564(b)(1) of the Act, 21 U.S.C.section 360bbb-3(b)(1), unless the authorization is terminated  or revoked sooner.       Influenza A by PCR NEGATIVE NEGATIVE Final   Influenza B by PCR NEGATIVE NEGATIVE Final    Comment: (NOTE) The Xpert Xpress SARS-CoV-2/FLU/RSV plus assay is intended as an aid in the diagnosis of influenza from Nasopharyngeal swab specimens  and should not be used as a sole basis for treatment. Nasal washings and aspirates are unacceptable for Xpert Xpress SARS-CoV-2/FLU/RSV testing.  Fact Sheet for Patients: EntrepreneurPulse.com.au  Fact Sheet for Healthcare Providers: IncredibleEmployment.be  This test is not yet approved or cleared by the [place] FDA and has been authorized for detection and/or diagnosis of SARS-CoV-2 by FDA under an Emergency Use Authorization (EUA). This EUA will remain in effect (meaning this test can be used) for the duration of the COVID-19 declaration under Section 564(b)(1) of the Act, 21 U.S.C. section 360bbb-3(b)(1), unless the authorization is terminated or revoked.  Performed at [facility], [address], [place], [place] [address]   Blood Culture (routine x 2)     Status: None (Preliminary result)   Collection Time: 10/19/21  6:12 AM   Specimen: BLOOD RIGHT HAND  Result Value Ref Range Status   Specimen Description BLOOD RIGHT HAND  Final   Special Requests   Final    BOTTLES DRAWN AEROBIC AND ANAEROBIC Blood Culture adequate volume   Culture   Final    NO GROWTH 1 DAY Performed at [facility], [place] [address], [place], [place] [address]    Report Status PENDING  Incomplete  Urine Culture     Status: Abnormal (Preliminary result)   Collection Time: 10/19/21  6:13 AM   Specimen: In/Out Cath Urine  Result Value Ref Range Status   Specimen Description IN/OUT CATH URINE  Final   Special Requests NONE  Final   Culture (A)  Final    >=100,000 COLONIES/mL ESCHERICHIA COLI >=100,000 COLONIES/mL KLEBSIELLA PNEUMONIAE SUSCEPTIBILITIES TO FOLLOW Performed at [facility], [place] [address], [place], [place] [address]    Report Status PENDING  Incomplete  SARS Coronavirus 2 by RT PCR (hospital order, performed in [facility] hospital lab) *cepheid single result test* Anterior Nasal Swab     Status: None    Collection Time: 10/19/21  6:13 AM   Specimen: Anterior Nasal Swab  Result Value Ref Range Status   SARS Coronavirus 2 by RT PCR NEGATIVE NEGATIVE Final    Comment: (NOTE) SARS-CoV-2 target nucleic acids are NOT DETECTED.  The SARS-CoV-2 RNA is generally detectable in upper and lower respiratory specimens during the acute phase of infection. The lowest concentration of SARS-CoV-2 viral copies this assay can detect is 250 copies / mL. A negative result does not preclude SARS-CoV-2 infection and should not be used as the sole basis for treatment or other patient management decisions.  A negative result may occur with improper specimen collection / handling, submission of specimen other than nasopharyngeal swab, presence of viral mutation(s) within the areas targeted by this assay, and inadequate number of viral copies (<250 copies / mL). A negative result must be combined with clinical observations, patient history, and epidemiological information.  Fact Sheet for Patients:   https://www.patel.info/  Fact Sheet for Healthcare Providers: https://hall.com/  This test is not yet approved or  cleared by the [place] FDA and has been authorized for detection and/or diagnosis of SARS-CoV-2 by FDA under an Emergency Use Authorization (EUA).  This EUA will remain in effect (meaning this test can be used) for the duration of the COVID-19 declaration under Section 564(b)(1) of the Act, 21 U.S.C. section 360bbb-3(b)(1), unless the authorization is terminated or revoked sooner.  Performed at [facility], [place] [address], [place], [place] [address]   Blood Culture (routine x 2)     Status: None (Preliminary  result)   Collection Time: 10/19/21  6:17 AM   Specimen: BLOOD  Result Value Ref Range Status   Specimen Description BLOOD SITE NOT SPECIFIED  Final   Special Requests   Final    BOTTLES DRAWN AEROBIC AND ANAEROBIC Blood Culture  results may not be optimal due to an inadequate volume of blood received in culture bottles   Culture   Final    NO GROWTH 1 DAY Performed at [facility] [address], [place], [place] [address]    Report Status PENDING  Incomplete  MRSA Next Gen by PCR, Nasal     Status: None   Collection Time: 10/19/21  2:00 PM  Result Value Ref Range Status   MRSA by PCR Next Gen NOT DETECTED NOT DETECTED Final    Comment: (NOTE) The GeneXpert MRSA Assay (FDA approved for NASAL specimens only), is one component of a comprehensive MRSA colonization surveillance program. It is not intended to diagnose MRSA infection nor to guide or monitor treatment for MRSA infections. Test performance is not FDA approved in patients less than 25 years old. Performed at [facility], [place] [address], [place], [place] [address]     RADIOLOGY STUDIES/RESULTS: CT Angio Chest PE W and/or Wo Contrast  Result Date: 10/19/2021 CLINICAL DATA:  Hypoxia and elevated D-dimer. Evaluate for pulmonary embolism. Also lower abdominal pain and fever. 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 EXAM: CT ANGIOGRAPHY CHEST CT ABDOMEN AND PELVIS WITH CONTRAST TECHNIQUE: Multidetector CT imaging of the chest was performed using the standard protocol during bolus administration of intravenous contrast. Multiplanar CT image reconstructions and MIPs were obtained to evaluate the vascular anatomy. Multidetector CT imaging of the abdomen and pelvis was performed using the standard protocol during bolus administration of intravenous contrast. RADIATION DOSE REDUCTION: This exam was performed according to the departmental dose-optimization program which includes automated exposure control, adjustment of the mA and/or kV according to patient size and/or use of iterative reconstruction technique. CONTRAST:  49m OMNIPAQUE IOHEXOL 350 MG/ML SOLN  COMPARISON:  CT chest 08/26/2021 FINDINGS: CTA CHEST FINDINGS Cardiovascular: Borderline cardiomegaly. Evidence of median sternotomy and prior CABG. Calcification over the mitral valve annulus. Thoracic aorta is normal caliber. There is calcified plaque over the descending thoracic aorta as there is focal noncalcified mural thrombus laterally over the mid descending thoracic aorta. Pulmonary arterial system is well opacified without evidence of emboli. Mediastinum/Nodes: 1.2 cm precarinal lymph node likely reactive. No other significant mediastinal or hilar adenopathy. Remaining mediastinal structures are unremarkable. 1.2 cm left thyroid nodule as no follow-up imaging is recommended. Lungs/Pleura: Lungs are adequately inflated. Stable 4 mm nodule over the right upper lobe (image 73). Previously seen 7 mm right middle lobe nodule is obscured by airspace opacification. Stable 4 mm nodule over the left upper lobe (image 68). Two small nodules over the left lower lobe unchanged (image 81 and 82) but partially obscured by airspace opacification. Persistent focal airspace density over the left lower lobe unchanged from previous exam. There is new patchy bilateral hazy airspace opacification most prominent over the right mid to upper lung likely multifocal infection. Tiny amount right pleural fluid. Mild posterior bibasilar atelectasis. Airways are unremarkable. Musculoskeletal: No new findings. Review of the MIP images confirms the above findings. CT ABDOMEN and PELVIS FINDINGS Hepatobiliary: Previous cholecystectomy. Liver and biliary tree are otherwise unchanged. Pancreas: 1.7 cm cystic focus over the body of  the pancreas in the midline (previously 1.3 cm MRI 2020). Spleen: Normal. Adrenals/Urinary Tract: Adrenal glands are normal. Small left kidney compatible previous partial nephrectomy for renal cell carcinoma. Multiple hypodense left renal cortical lesions unchanged likely cysts. Calcification changes in the left  perinephric space which are stable. Multiple right renal cysts unchanged. 3 mm calcification over the upper pole right kidney which may represent a small stone. No evidence of hydronephrosis. Ureters and bladder are normal. Stomach/Bowel: Stomach and small bowel are normal. Appendix not visualized. Mild fecal retention over the transverse colon as the colon is otherwise unremarkable. Vascular/Lymphatic: Calcified plaque over the abdominal aorta which is normal in caliber. No adenopathy. Reproductive: Previous hysterectomy. Other: No free fluid or focal inflammatory change. Musculoskeletal: Severe degenerative changes of the spine with multilevel disc disease and curvature of the lumbar spine convex left. Review of the MIP images confirms the above findings. IMPRESSION: 1. No evidence of pulmonary embolism. 2. New patchy bilateral hazy airspace opacification most prominent over the right mid to upper lung likely multifocal infection. Tiny amount of right pleural fluid. Persistent focal airspace density over the left lower lobe unchanged from previous exam. Stable bilateral pulmonary nodules as described. Recommend attention on follow-up in this patient with known underlying malignancy. 3. No acute findings in the abdomen/pelvis. 4. 1.7 cm cystic focus over the body of the pancreas in the midline (previously 1.3 cm MRI 2020). Recommend follow-up abdominal MRI in 6 months. 5. Evidence of previous partial left nephrectomy for renal cell carcinoma. Bilateral renal cysts unchanged. 3 mm nonobstructing right renal stone. 6. Aortic atherosclerosis. Aortic Atherosclerosis (ICD10-I70.0). Electronically Signed   By: Marin Olp M.D.   On: 10/19/2021 11:45   CT ABDOMEN PELVIS W CONTRAST  Result Date: 10/19/2021 CLINICAL DATA:  Hypoxia and elevated D-dimer. Evaluate for pulmonary embolism. Also lower abdominal pain and fever. History of renal cell carcinoma post partial left nephrectomy as well as history of neuroendocrine  lung tumor and prior radiation therapy. EXAM: CT ANGIOGRAPHY CHEST CT ABDOMEN AND PELVIS WITH CONTRAST TECHNIQUE: Multidetector CT imaging of the chest was performed using the standard protocol during bolus administration of intravenous contrast. Multiplanar CT image reconstructions and MIPs were obtained to evaluate the vascular anatomy. Multidetector CT imaging of the abdomen and pelvis was performed using the standard protocol during bolus administration of intravenous contrast. RADIATION DOSE REDUCTION: This exam was performed according to the departmental dose-optimization program which includes automated exposure control, adjustment of the mA and/or kV according to patient size and/or use of iterative reconstruction technique. CONTRAST:  24m OMNIPAQUE IOHEXOL 350 MG/ML SOLN COMPARISON:  CT chest 08/26/2021 FINDINGS: CTA CHEST FINDINGS Cardiovascular: Borderline cardiomegaly. Evidence of median sternotomy and prior CABG. Calcification over the mitral valve annulus. Thoracic aorta is normal caliber. There is calcified plaque over the descending thoracic aorta as there is focal noncalcified mural thrombus laterally over the mid descending thoracic aorta. Pulmonary arterial system is well opacified without evidence of emboli. Mediastinum/Nodes: 1.2 cm precarinal lymph node likely reactive. No other significant mediastinal or hilar adenopathy. Remaining mediastinal structures are unremarkable. 1.2 cm left thyroid nodule as no follow-up imaging is recommended. Lungs/Pleura: Lungs are adequately inflated. Stable 4 mm nodule over the right upper lobe (image 73). Previously seen 7 mm right middle lobe nodule is obscured by airspace opacification. Stable 4 mm nodule over the left upper lobe (image 68). Two small nodules over the left lower lobe unchanged (image 81 and 82) but partially obscured by airspace opacification. Persistent focal airspace  density over the left lower lobe unchanged from previous exam. There is  new patchy bilateral hazy airspace opacification most prominent over the right mid to upper lung likely multifocal infection. Tiny amount right pleural fluid. Mild posterior bibasilar atelectasis. Airways are unremarkable. Musculoskeletal: No new findings. Review of the MIP images confirms the above findings. CT ABDOMEN and PELVIS FINDINGS Hepatobiliary: Previous cholecystectomy. Liver and biliary tree are otherwise unchanged. Pancreas: 1.7 cm cystic focus over the body of the pancreas in the midline (previously 1.3 cm MRI 2020). Spleen: Normal. Adrenals/Urinary Tract: Adrenal glands are normal. Small left kidney compatible previous partial nephrectomy for renal cell carcinoma. Multiple hypodense left renal cortical lesions unchanged likely cysts. Calcification changes in the left perinephric space which are stable. Multiple right renal cysts unchanged. 3 mm calcification over the upper pole right kidney which may represent a small stone. No evidence of hydronephrosis. Ureters and bladder are normal. Stomach/Bowel: Stomach and small bowel are normal. Appendix not visualized. Mild fecal retention over the transverse colon as the colon is otherwise unremarkable. Vascular/Lymphatic: Calcified plaque over the abdominal aorta which is normal in caliber. No adenopathy. Reproductive: Previous hysterectomy. Other: No free fluid or focal inflammatory change. Musculoskeletal: Severe degenerative changes of the spine with multilevel disc disease and curvature of the lumbar spine convex left. Review of the MIP images confirms the above findings. IMPRESSION: 1. No evidence of pulmonary embolism. 2. New patchy bilateral hazy airspace opacification most prominent over the right mid to upper lung likely multifocal infection. Tiny amount of right pleural fluid. Persistent focal airspace density over the left lower lobe unchanged from previous exam. Stable bilateral pulmonary nodules as described. Recommend attention on follow-up in  this patient with known underlying malignancy. 3. No acute findings in the abdomen/pelvis. 4. 1.7 cm cystic focus over the body of the pancreas in the midline (previously 1.3 cm MRI 2020). Recommend follow-up abdominal MRI in 6 months. 5. Evidence of previous partial left nephrectomy for renal cell carcinoma. Bilateral renal cysts unchanged. 3 mm nonobstructing right renal stone. 6. Aortic atherosclerosis. Aortic Atherosclerosis (ICD10-I70.0). Electronically Signed   By: Marin Olp M.D.   On: 10/19/2021 11:45     LOS: 2 days   Oren Binet, MD  [facility]    To contact the attending provider between 7A-7P or the covering provider during after hours 7P-7A, please log into the web site [email or web address] and access using universal [place] password for that web site. If you do not have the password, please call the hospital operator.  10/21/2021, 9:51 AM

## 2021-10-21 NOTE — Progress Notes (Signed)
   10/21/21 1200  PT Visit Information  Last PT Received On 10/21/21   PT - Assessment/Plan  PT equipment Rollator (4 wheels)   Contacted by nurse that husband requesting rollator for pt.  This PT in agreement that pt needs device and has updated pts recommendations as such.  Pt and husband told this PT pt had a RW and could use that and that is why PT didn't recommend device previously.  Thanks. Sofija Antwi M,PT Acute Rehab Services [phone]

## 2021-10-22 ENCOUNTER — Inpatient Hospital Stay (HOSPITAL_COMMUNITY): Payer: Medicare PPO

## 2021-10-22 DIAGNOSIS — R0902 Hypoxemia: Secondary | ICD-10-CM | POA: Diagnosis not present

## 2021-10-22 DIAGNOSIS — A419 Sepsis, unspecified organism: Secondary | ICD-10-CM | POA: Diagnosis not present

## 2021-10-22 DIAGNOSIS — R6521 Severe sepsis with septic shock: Secondary | ICD-10-CM | POA: Diagnosis not present

## 2021-10-22 DIAGNOSIS — J189 Pneumonia, unspecified organism: Secondary | ICD-10-CM | POA: Diagnosis not present

## 2021-10-22 LAB — COMPREHENSIVE METABOLIC PANEL
ALT: 33 U/L (ref 0–44)
AST: 38 U/L (ref 15–41)
Albumin: 2.2 g/dL — ABNORMAL LOW (ref 3.5–5.0)
Alkaline Phosphatase: 72 U/L (ref 38–126)
Anion gap: 13 (ref 5–15)
BUN: 31 mg/dL — ABNORMAL HIGH (ref 8–23)
CO2: 28 mmol/L (ref 22–32)
Calcium: 9.1 mg/dL (ref 8.9–10.3)
Chloride: 93 mmol/L — ABNORMAL LOW (ref 98–111)
Creatinine, Ser: 4.91 mg/dL — ABNORMAL HIGH (ref 0.44–1.00)
GFR, Estimated: 9 mL/min — ABNORMAL LOW (ref >60)
Glucose, Bld: 154 mg/dL — ABNORMAL HIGH (ref 70–99)
Potassium: 4.1 mmol/L (ref 3.5–5.1)
Sodium: 134 mmol/L — ABNORMAL LOW (ref 135–145)
Total Bilirubin: 0.7 mg/dL (ref 0.3–1.2)
Total Protein: 5.1 g/dL — ABNORMAL LOW (ref 6.5–8.1)

## 2021-10-22 LAB — URINE CULTURE: Culture: >=100000 — AB

## 2021-10-22 LAB — CBC
HCT: 25.7 % — ABNORMAL LOW (ref 36.0–46.0)
Hemoglobin: 9 g/dL — ABNORMAL LOW (ref 12.0–15.0)
MCH: 36 pg — ABNORMAL HIGH (ref 26.0–34.0)
MCHC: 35 g/dL (ref 30.0–36.0)
MCV: 102.8 fL — ABNORMAL HIGH (ref 80.0–100.0)
Platelets: 127 10*3/uL — ABNORMAL LOW (ref 150–400)
RBC: 2.5 MIL/uL — ABNORMAL LOW (ref 3.87–5.11)
RDW: 14.6 % (ref 11.5–15.5)
WBC: 5.5 10*3/uL (ref 4.0–10.5)
nRBC: 0 % (ref 0.0–0.2)

## 2021-10-22 MED ORDER — DARBEPOETIN ALFA 25 MCG/0.42ML IJ SOSY
25.0000 ug | PREFILLED_SYRINGE | INTRAMUSCULAR | Status: DC
  Administered 2021-10-22: 25 ug via INTRAVENOUS
  Filled 2021-10-22: qty 0.42

## 2021-10-22 MED ORDER — GUAIFENESIN-DM 100-10 MG/5ML PO SYRP
5.0000 mL | ORAL_SOLUTION | ORAL | Status: DC | PRN
  Administered 2021-10-22: 5 mL via ORAL
  Filled 2021-10-22: qty 5

## 2021-10-22 NOTE — Progress Notes (Signed)
Received patient in bed to unit.  Alert and oriented.  Informed consent signed and in chart.   Treatment initiated: 0824 Treatment completed: 1218  Patient tolerated well.  Transported back to the room  Alert, without acute distress.  Hand-off given to patient's nurse.   Access used: AvFistula Access issues: none  Total UF removed: 1.5L Medication(s) given: Aranesp Post HD VS: 97.5,99,19,108/60,95% Post HD weight: 59.7kg   [facility]

## 2021-10-22 NOTE — Progress Notes (Signed)
Physical Therapy Treatment Patient Details Name: Dana Jackson MRN: [id number] DOB: February 02, 1945 Today's Date: 10/22/2021   History of Present Illness Dana Jackson is a 76 y.o. female admitted 10/9  who presents to the emergency department for several weeks of cough who developed a fever and emesis today. Also desaturation.  Pt found to have sepsis. PMH: ESRD on dialysis TTS, lung cancer status post chemo/radiation, recent NSTEMI with three-vessel disease treated with medical management.    PT Comments    Pt received in recliner, agreeable to therapy session, but limited due to tachycardia and fatigue after HD. Pt HR to 137 bpm with limited household distance ambulation tasks in room, (pt provided with min guard for safety) and needing up to minA for sit<>stand stability. Reviewed use of IS, pt SpO2 90-98% on RA with exertional tasks and DOE 2/4. Reviewed supine LE exercises and pt performed as detailed below. RN aware of pt tachycardia. Pt continues to benefit from PT services to progress toward functional mobility goals.   Recommendations for follow up therapy are one component of a multi-disciplinary discharge planning process, led by the attending physician.  Recommendations may be updated based on patient status, additional functional criteria and insurance authorization.  Follow Up Recommendations  Home health PT     Assistance Recommended at Discharge Intermittent Supervision/Assistance  Patient can return home with the following A little help with walking and/or transfers;Assistance with cooking/housework;Assist for transportation;Help with stairs or ramp for entrance   Equipment Recommendations  Rollator (4 wheels)    Recommendations for Other Services       Precautions / Restrictions Precautions Precautions: Fall Precaution Comments: watch SpO2/HR Restrictions Weight Bearing Restrictions: No     Mobility  Bed Mobility Overal bed mobility: Independent                   Transfers Overall transfer level: Needs assistance Equipment used: None (wall rail in bathroom) Transfers: Sit to/from Stand Sit to Stand: Min guard, Min assist           General transfer comment: Pt provided with steadying assist initially upon standing and when sitting to low toilet seat    Ambulation/Gait Ambulation/Gait assistance: Min guard Gait Distance (Feet): 30 Feet (85f, 299f Assistive device: None Gait Pattern/deviations: Step-through pattern, Decreased stride length       General Gait Details: HR elevated to 137 bpm with exertion; SpO2 90-98% on RA; distance limited due to pt fatigue after HD and elevated HR    Balance Overall balance assessment: Needs assistance Sitting-balance support: No upper extremity supported, Feet supported Sitting balance-Leahy Scale: Fair     Standing balance support: No upper extremity supported, During functional activity Standing balance-Leahy Scale: Fair Standing balance comment: Pt was able to stand statically without UE support.              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, hospital       Exercises Other Exercises Other Exercises: provided spirometer, education and able to pull ~1,000 x 10, instructed to perform 3-5 times every 30 min/up to 10 times hourly Other Exercises: supine BLE AROM: SLR, hip abduction, ankle pumps, heel slides x5-10 reps  ea    General Comments        Pertinent Vitals/Pain Pain Assessment Pain Assessment: No/denies pain Faces Pain Scale: No hurt           PT Goals (current goals can now be found in the care plan section) Acute Rehab PT Goals Patient Stated Goal: to go home PT Goal Formulation: With  patient Time For Goal Achievement: 11/03/21 Progress towards PT goals: Progressing toward goals    Frequency    Min 3X/week      PT Plan Current plan remains appropriate       AM-PAC PT "6 Clicks" Mobility   Outcome Measure  Help needed turning from your back to your side while in a flat bed without using bedrails?: None Help needed moving from lying on your back to sitting on the side of a flat bed without using bedrails?: None Help needed moving to and from a bed to a chair (including a wheelchair)?: A Little Help needed standing up from a chair using your arms (e.g., wheelchair or bedside chair)?: A Little Help needed to walk in hospital room?: A Little Help needed climbing 3-5 steps with a railing? : A Lot 6 Click Score: 19    End of Session Equipment Utilized During Treatment: Gait belt (pt weaned to RA earlier in the day) Activity Tolerance: Patient tolerated treatment well;Patient limited by fatigue Patient left: in bed;with call bell/phone within reach;with family/visitor present Nurse Communication: Mobility status;Other (comment) (tachycardia) PT Visit Diagnosis: Muscle weakness (generalized) (M62.81)     Time: 3300-7622 PT Time Calculation (min) (ACUTE ONLY): 32 min  Charges:  $Gait Training: 8-22 mins $Therapeutic Exercise: 8-22 mins                     Evalena Fujii P., PTA Acute Rehabilitation Services Secure Chat Preferred 9a-5:30pm Office: [place] 10/22/2021, 6:09 PM

## 2021-10-22 NOTE — Progress Notes (Signed)
[facility] Progress Note   Subjective:    Seen and examined patient on HD. No acute complaints. Tolerating UFG 2.5L.   Objective Vitals:   10/22/21 0205 10/22/21 0458 10/22/21 0732 10/22/21 0824  BP:  (!) 143/63 (!) 165/71 (!) 155/70  Pulse:  (!) 101 97 87  Resp:  15 16 (!) 21  Temp:  97.6 F (36.4 C) 97.7 F (36.5 C)   TempSrc:  Axillary Oral   SpO2: 97% 99% 95% 96%  Weight:    61.5 kg  Height:       Physical Exam General: Older female, awake, alert, on 2L O2, NAD Heart: S1 and S2; No MGRs Lungs: Diminished at bases; clear in uppers/anteriorly Abdomen: Soft and non-tender Extremities: No LE edema Dialysis Access: LUE AVF (+) B/T    Filed Weights   10/20/21 1851 10/21/21 0500 10/22/21 0824  Weight: 62.2 kg 61.5 kg 61.5 kg    Intake/Output Summary (Last 24 hours) at 10/22/2021 1005 Last data filed at 10/22/2021 0542 Gross per 24 hour  Intake 220 ml  Output --  Net 220 ml    Additional Objective Labs: Basic Metabolic Panel: Recent Labs  Lab 10/19/21 0617 10/19/21 0635 10/19/21 0732 10/19/21 1323 10/19/21 1334 10/20/21 0619 10/22/21 0243  NA 135   < > 135   < > 132* 130* 134*  K 3.8   < > 3.8   < > 3.8 4.1 4.1  CL 97*   < > 96*  --   --  97* 93*  CO2 28  --   --   --   --  21* 28  GLUCOSE 114*   < > 107*  --   --  71 154*  BUN 33*   < > 34*  --   --  41* 31*  CREATININE 4.77*   < > 4.90*  --   --  5.39* 4.91*  CALCIUM 8.4*  --   --   --   --  8.3* 9.1  PHOS  --   --   --   --   --  3.9  --    < > = values in this interval not displayed.   Liver Function Tests: Recent Labs  Lab 10/19/21 0617 10/22/21 0243  AST 33 38  ALT 28 33  ALKPHOS 101 72  BILITOT 0.7 0.7  PROT 5.8* 5.1*  ALBUMIN 2.7* 2.2*   No results for input(s): "LIPASE", "AMYLASE" in the last 168 hours. CBC: Recent Labs  Lab 10/19/21 0617 10/19/21 0635 10/19/21 1334 10/20/21 0619 10/22/21 0243  WBC 5.3  --   --  9.1 5.5  NEUTROABS 4.8  --   --   --   --    HGB 10.8*   < > 12.2 8.7* 9.0*  HCT 31.5*   < > 36.0 25.0* 25.7*  MCV 105.7*  --   --  105.0* 102.8*  PLT 159  --   --  130* 127*   < > = values in this interval not displayed.   Blood Culture    Component Value Date/Time   SDES BLOOD SITE NOT SPECIFIED 10/19/2021 0617   SPECREQUEST  10/19/2021 0617    BOTTLES DRAWN AEROBIC AND ANAEROBIC Blood Culture results may not be optimal due to an inadequate volume of blood received in culture bottles   CULT  10/19/2021 0617    NO GROWTH 2 DAYS Performed at [facility], [place] [address], [place], [place] [address]  REPTSTATUS PENDING 10/19/2021 0617    Cardiac Enzymes: No results for input(s): "CKTOTAL", "CKMB", "CKMBINDEX", "TROPONINI" in the last 168 hours. CBG: Recent Labs  Lab 10/19/21 1356  GLUCAP 119*   Iron Studies: No results for input(s): "IRON", "TIBC", "TRANSFERRIN", "FERRITIN" in the last 72 hours. Lab Results  Component Value Date   INR 1.0 10/19/2021   INR 1.0 03/27/2019   INR 0.96 03/09/2012   Studies/Results: No results found.  Medications:  piperacillin-tazobactam (ZOSYN)  IV 2.25 g (10/22/21 0537)    aspirin EC  81 mg Oral Daily   atorvastatin  80 mg Oral QHS   Chlorhexidine Gluconate Cloth  6 each Topical Q0600   clopidogrel  75 mg Oral Daily   cyanocobalamin  1,000 mcg Intramuscular Q30 days   gabapentin  100 mg Oral BID   heparin  2,000 Units Dialysis Once in dialysis   pantoprazole  40 mg Oral Daily   pantoprazole  40 mg Oral Daily    Dialysis Orders: [facility] TTS 4h  400/1.5  62.7kg  2/2 bath  Hep 2000   LUA AVF - Hb 10.3 on 10/5 - mircera 50 ug q2 wks, not started yet - venofer '50mg'$  weekly - last HD 10/7 w/ post wt 62.1kg     Home meds include - aspirin, atorvastatin, clopidogrel, prns/ vits/ supps  Assessment/Plan: Septic shock 2/2 HCAP - w/ fevers, chills , cough. Getting IV Zosyn AHRF - d/t HCAP, +/- vol overload ESRD - on HD TTS. On HD and tolerating UFG 2.5L.  HTN - holding home  meds given low BPs. Euvolemic on exam, at dry wt. CXR at admit w/ some vasc congestion vs infection, CT scan the same. Will challenge UF if needed. Plan to repeat CXR after HD today. 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 Will start ESA. 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 Not getting any vdra or sensipar.   Tobie Poet, NP [facility] 10/22/2021,10:05 AM  LOS: 3 days

## 2021-10-22 NOTE — Progress Notes (Signed)
PT Cancellation Note  Patient Details Name: Dana Jackson MRN: [id number] DOB: 1945/11/27   Cancelled Treatment:    Reason Eval/Treat Not Completed: (P) Patient at procedure or test/unavailable (pt at HD, attempt ~9:45am then ~12:15pm.) Will continue efforts per PT plan of care as schedule permits.   Kara Pacer Lucille Crichlow 10/22/2021, 12:23 PM

## 2021-10-22 NOTE — Progress Notes (Signed)
Modified Barium Swallow Progress Note  Patient Details  Name: Camree Wigington MRN: [id number] Date of Birth: [birth year]  Today's Date: 10/22/2021  Modified Barium Swallow completed.  Full report located under Chart Review in the Imaging Section.  Brief recommendations include the following:  Clinical Impression  Patient presents with a mild pharyngeal phase dysphagia but with oral and cervical-esophageal phases of swallow appearing WFL. During pharyngeal phase, patient exhibited instances of flash penetration above vocal cords with thin liquids (no significant difference with cup sips versus straw sips) with full clearance of penetrate from vestibule. No aspiration observed at any phase of the swallow and no pharyngeal residuals observed post swallows. 13 mm barium tablet transited pharyngeally and through cervical esophagus without difficulty. Esophageal sweep did not reveal any significant dysmotility or stasis of barium. Patient appears safe to continue on regular texture solids, thin liquids and aspiration risk is mild.   Swallow Evaluation Recommendations       SLP Diet Recommendations: Regular solids;Thin liquid   Liquid Administration via: Cup;Straw   Medication Administration: Whole meds with liquid   Supervision: Patient able to self feed   Compensations: Slow rate;Small sips/bites   Postural Changes: Seated upright at 90 degrees           Sonia Baller, MA, CCC-SLP Speech Therapy

## 2021-10-22 NOTE — Progress Notes (Signed)
PROGRESS NOTE        PATIENT DETAILS Name: Dana Jackson Age: 76 y.o. Sex: female Date of Birth: 03-01-1945 Admit Date: 10/19/2021 Admitting Physician Jacky Kindle, MD CVE:[id number], Gracelyn Nurse, PA-C  Brief Summary: Patient is a 76 y.o.  female with history of ESRD on HD TTS, recent hospitalization for non-STEMI-who presented to the hospital with fever, tachypnea-was found to have acute hypoxic respiratory failure in the setting of septic shock due to PNA and subsequently admitted to the ICU.  Patient required pressors/empiric antibiotics-stabilized and transferred to [facility] on 10/11.  Significant events: 10/3-10/4>> hospitalization for non-STEMI-LHC with diffuse disease-no targets for revascularization 10/9>> admit to ICU-septic shock/hypoxia 2/2 PNA  Significant studies: 10/9>> CXR: Right> left PNA 10/9>> CTA chest: No PE, multifocal PNA.  Significant microbiology data: 10/9>> COVID PCR: Negative 10/9>> blood culture: Negative 10/9>> urine culture: E. coli/Klebsiella pneumonia (likely colonization-symptoms more consistent with PNA)  Procedures: None  Consults: PCCM Nephrology  Subjective: Seen earlier this morning at HD unit. No SOB-feels better  Objective: Vitals: Blood pressure 108/60, pulse 99, temperature (!) 97.5 F (36.4 C), resp. rate 19, height 5' (1.524 m), weight 59.7 kg, SpO2 95 %.   Exam: Gen Exam:Alert awake-not in any distress HEENT:atraumatic, normocephalic Chest: B/L clear to auscultation anteriorly CVS:S1S2 regular Abdomen:soft non tender, non distended Extremities:no edema Neurology: Non focal Skin: no rash   Pertinent Labs/Radiology:    Latest Ref Rng & Units 10/22/2021    2:43 AM 10/20/2021    6:19 AM 10/19/2021    1:34 PM  CBC  WBC 4.0 - 10.5 K/uL 5.5  9.1    Hemoglobin 12.0 - 15.0 g/dL 9.0  8.7  12.2   Hematocrit 36.0 - 46.0 % 25.7  25.0  36.0   Platelets 150 - 400 K/uL 127  130      Lab Results  Component  Value Date   NA 134 (L) 10/22/2021   K 4.1 10/22/2021   CL 93 (L) 10/22/2021   CO2 28 10/22/2021      Assessment/Plan: Septic shock Acute hypoxic respiratory failure Multifocal PNA Sepsis physiology resolved Transitioned to Room air Continue Zosyn Mobilize today when off HD Await further recommendations from SLP If continues to do well-home tomorrow morning  CAD Recent non-STEMI No anginal symptoms Continue ASA/Plavix/Lipitor  ESRD on HD TTS Nephrology following and directing care  Macrocytic anemia Due to ESRD/acute illness B12 levels borderline-but appears to be on vitamin B12 injections per MAR. 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  BMI: Estimated body mass index is 25.7 kg/m as calculated from the following:   Height as of this encounter: 5' (1.524 m).   Weight as of this encounter: 59.7 kg.   Code status:   Code Status: Full Code   DVT Prophylaxis:    Family Communication: none at bedside   Disposition Plan: Status is: Inpatient Remains inpatient appropriate because: Resolving sepsis physiology-not yet stable for discharge.   Planned Discharge Destination:Home-hopefully 10/13 if clinical  improvement continues   Diet: Diet Order             Diet Heart Room service appropriate? Yes; Fluid consistency: Thin  Diet effective now                     Antimicrobial agents: Anti-infectives (From admission, onward)    Start     Dose/Rate Route Frequency Ordered Stop   10/19/21 2200   piperacillin-tazobactam (ZOSYN) IVPB 2.25 g        2.25 g 100 mL/hr over 30 Minutes Intravenous Every 8 hours 10/19/21 1910     10/19/21 1908  vancomycin variable dose per unstable renal function (pharmacist dosing)  Status:  Discontinued         Does not apply See admin instructions 10/19/21 1908 10/20/21 0912   10/19/21 0800  ceFEPIme (MAXIPIME) 2 g in sodium chloride 0.9 % 100 mL IVPB        2 g 200 mL/hr over 30 Minutes Intravenous  Once 10/19/21 0754 10/19/21 0819   10/19/21 0800  vancomycin (VANCOREADY) IVPB 1250 mg/250 mL        1,250 mg 166.7 mL/hr over 90 Minutes Intravenous  Once 10/19/21 0759 10/19/21 1026   10/19/21 0759  vancomycin variable dose per unstable renal function (pharmacist dosing)  Status:  Discontinued         Does not apply See admin instructions 10/19/21 0759 10/19/21 1357        MEDICATIONS: Scheduled Meds:  aspirin EC  81 mg Oral Daily   atorvastatin  80 mg Oral QHS   Chlorhexidine Gluconate Cloth  6 each Topical Q0600   clopidogrel  75 mg Oral Daily   cyanocobalamin  1,000 mcg Intramuscular Q30 days   darbepoetin (ARANESP) injection - DIALYSIS  25 mcg Intravenous Q Thu-HD   gabapentin  100 mg Oral BID   pantoprazole  40 mg Oral Daily   pantoprazole  40 mg Oral Daily   Continuous Infusions:  piperacillin-tazobactam (ZOSYN)  IV 2.25 g (10/22/21 0537)   PRN Meds:.acetaminophen, guaiFENesin-dextromethorphan, ipratropium-albuterol, ondansetron (ZOFRAN) IV, polyethylene glycol   I have personally reviewed following labs and imaging studies  LABORATORY DATA: CBC: Recent Labs  Lab 10/19/21 0617 10/19/21 0635 10/19/21 0732 10/19/21 1323 10/19/21 1334 10/20/21 0619 10/22/21 0243  WBC 5.3  --   --   --   --  9.1 5.5  NEUTROABS 4.8  --   --   --   --   --   --   HGB 10.8*   < > 10.2* 9.2* 12.2 8.7* 9.0*  HCT 31.5*   < > 30.0* 27.0* 36.0 25.0* 25.7*  MCV 105.7*  --   --   --   --  105.0* 102.8*  PLT 159  --   --   --   --  130* 127*   < > =  values in this interval not displayed.     Basic Metabolic Panel: Recent Labs  Lab 10/19/21 0617 10/19/21 0635 10/19/21 0732 10/19/21 1323 10/19/21 1334 10/20/21 0619 10/22/21 0243  NA 135 136  134* 135 132* 132* 130* 134*  K 3.8 3.9  3.9 3.8 3.8 3.8 4.1 4.1  CL 97* 96* 96*  --   --  97* 93*  CO2 28  --   --   --   --  21* 28  GLUCOSE 114* 112* 107*  --   --  71 154*  BUN 33* 33* 34*  --   --  41* 31*  CREATININE 4.77* 4.90* 4.90*  --   --  5.39* 4.91*  CALCIUM 8.4*  --   --   --   --  8.3* 9.1  MG  --   --   --   --   --  2.0  --   PHOS  --   --   --   --   --  3.9  --      GFR: Estimated Creatinine Clearance: 7.9 mL/min (A) (by C-G formula based on SCr of 4.91 mg/dL (H)).  Liver Function Tests: Recent Labs  Lab 10/19/21 0617 10/22/21 0243  AST 33 38  ALT 28 33  ALKPHOS 101 72  BILITOT 0.7 0.7  PROT 5.8* 5.1*  ALBUMIN 2.7* 2.2*    No results for input(s): "LIPASE", "AMYLASE" in the last 168 hours. No results for input(s): "AMMONIA" in the last 168 hours.  Coagulation Profile: Recent Labs  Lab 10/19/21 0617  INR 1.0     Cardiac Enzymes: No results for input(s): "CKTOTAL", "CKMB", "CKMBINDEX", "TROPONINI" in the last 168 hours.  BNP (last 3 results) No results for input(s): "PROBNP" in the last 8760 hours.  Lipid Profile: No results for input(s): "CHOL", "HDL", "LDLCALC", "TRIG", "CHOLHDL", "LDLDIRECT" in the last 72 hours.  Thyroid Function Tests: No results for input(s): "TSH", "T4TOTAL", "FREET4", "T3FREE", "THYROIDAB" in the last 72 hours.  Anemia Panel: Recent Labs    10/19/21 1512 10/20/21 0619  VITAMINB12  --  391  FOLATE 8.1  --      Urine analysis:    Component Value Date/Time   COLORURINE AMBER (A) 10/19/2021 0645   APPEARANCEUR CLOUDY (A) 10/19/2021 0645   LABSPEC 1.009 10/19/2021 0645   PHURINE 8.0 10/19/2021 0645   GLUCOSEU NEGATIVE 10/19/2021 0645   HGBUR MODERATE (A) 10/19/2021 0645   BILIRUBINUR NEGATIVE 10/19/2021  0645   KETONESUR NEGATIVE 10/19/2021 0645   PROTEINUR 100 (A) 10/19/2021 0645   UROBILINOGEN 0.2 06/10/2014 1259   NITRITE NEGATIVE 10/19/2021 0645   LEUKOCYTESUR LARGE (A) 10/19/2021 0645    Sepsis Labs: Lactic Acid, Venous    Component Value Date/Time   LATICACIDVEN 1.2 10/19/2021 1512    MICROBIOLOGY: Recent Results (from the past 240 hour(s))  Resp Panel by RT-PCR (Flu A&B, Covid) Anterior Nasal Swab     Status: None   Collection Time: 10/13/21  1:58 AM   Specimen: Anterior Nasal Swab  Result Value Ref Range Status   SARS Coronavirus 2 by RT PCR NEGATIVE NEGATIVE Final    Comment: (NOTE) SARS-CoV-2 target nucleic acids are NOT DETECTED.  The SARS-CoV-2 RNA is generally detectable in upper respiratory specimens during the acute phase of infection. The lowest concentration of SARS-CoV-2 viral copies this assay can detect is 138 copies/mL. A negative result does not preclude SARS-Cov-2 infection and should not be used as the sole basis for treatment or other patient management decisions. A negative result may occur with  improper specimen collection/handling, submission of specimen other than nasopharyngeal swab, presence of viral mutation(s) within the areas targeted by this assay, and inadequate number of viral copies(<138 copies/mL). A negative result must be combined with clinical observations, patient history, and epidemiological information. The expected result is Negative.  Fact Sheet for Patients:  EntrepreneurPulse.com.au  Fact Sheet for Healthcare Providers:  IncredibleEmployment.be  This test is no t yet approved or cleared by the [place] FDA and  has been authorized for detection and/or diagnosis of SARS-CoV-2 by FDA under an Emergency Use Authorization (EUA).  This EUA will remain  in effect (meaning this test can be used) for the duration of the COVID-19 declaration under Section 564(b)(1) of the Act,  21 U.S.C.section 360bbb-3(b)(1), unless the authorization is terminated  or revoked sooner.       Influenza A by PCR NEGATIVE NEGATIVE Final   Influenza B by PCR NEGATIVE NEGATIVE Final    Comment: (NOTE) The Xpert Xpress SARS-CoV-2/FLU/RSV plus assay is intended as an aid in the diagnosis of influenza from Nasopharyngeal swab specimens and should not be used as a sole basis for treatment. Nasal washings and aspirates are unacceptable for Xpert Xpress SARS-CoV-2/FLU/RSV testing.  Fact Sheet for Patients: EntrepreneurPulse.com.au  Fact Sheet for Healthcare Providers: IncredibleEmployment.be  This test is not yet approved or cleared by the [place] FDA and has been authorized for detection and/or diagnosis of SARS-CoV-2 by FDA under an Emergency Use Authorization (EUA). This EUA will remain in effect (meaning this test can be used) for the duration of the COVID-19 declaration under Section 564(b)(1) of the Act, 21 U.S.C. section 360bbb-3(b)(1), unless the authorization is terminated or revoked.  Performed at [facility], [address] [place], [place] [address]   Blood Culture (routine x 2)     Status: None (Preliminary result)   Collection Time: 10/19/21  6:12 AM   Specimen: BLOOD RIGHT HAND  Result Value Ref Range Status   Specimen Description BLOOD RIGHT HAND  Final   Special Requests   Final    BOTTLES DRAWN AEROBIC AND ANAEROBIC Blood Culture adequate volume   Culture   Final    NO GROWTH 3 DAYS Performed at [facility], [place] [address] [place], [place] [address]    Report Status PENDING  Incomplete  Urine Culture     Status: Abnormal   Collection Time: 10/19/21  6:13 AM   Specimen: In/Out Cath Urine  Result Value Ref Range Status   Specimen Description IN/OUT CATH URINE  Final   Special Requests   Final    NONE Performed at [facility], [place] [address], [place],  [address]     Culture (A)  Final    >=100,000 COLONIES/mL ESCHERICHIA COLI >=100,000 COLONIES/mL KLEBSIELLA PNEUMONIAE    Report Status 10/22/2021 FINAL  Final   Organism ID, Bacteria ESCHERICHIA COLI (A)  Final   Organism ID, Bacteria KLEBSIELLA PNEUMONIAE (A)  Final      Susceptibility   Escherichia coli - MIC*    AMPICILLIN >=32 RESISTANT Resistant     CEFAZOLIN >=64 RESISTANT Resistant     CEFEPIME <=0.12 SENSITIVE Sensitive     CEFTRIAXONE <=0.25 SENSITIVE Sensitive     CIPROFLOXACIN <=0.25 SENSITIVE Sensitive     GENTAMICIN <=1 SENSITIVE Sensitive     IMIPENEM <=0.25 SENSITIVE Sensitive     NITROFURANTOIN <=16 SENSITIVE Sensitive     TRIMETH/SULFA <=20 SENSITIVE Sensitive     AMPICILLIN/SULBACTAM >=32 RESISTANT Resistant     PIP/TAZO 8 SENSITIVE Sensitive     * >=100,000 COLONIES/mL ESCHERICHIA COLI   Klebsiella pneumoniae - MIC*    AMPICILLIN >=32 RESISTANT Resistant     CEFAZOLIN <=4 SENSITIVE Sensitive     CEFEPIME <=0.12 SENSITIVE Sensitive     CEFTRIAXONE <=0.25 SENSITIVE Sensitive     CIPROFLOXACIN <=0.25 SENSITIVE Sensitive     GENTAMICIN <=1 SENSITIVE Sensitive     IMIPENEM 1 SENSITIVE Sensitive     NITROFURANTOIN 32 SENSITIVE Sensitive     TRIMETH/SULFA <=20 SENSITIVE Sensitive     AMPICILLIN/SULBACTAM >=32  RESISTANT Resistant     PIP/TAZO 8 SENSITIVE Sensitive     * >=100,000 COLONIES/mL KLEBSIELLA PNEUMONIAE  SARS Coronavirus 2 by RT PCR (hospital order, performed in [facility] hospital lab) *cepheid single result test* Anterior Nasal Swab     Status: None   Collection Time: 10/19/21  6:13 AM   Specimen: Anterior Nasal Swab  Result Value Ref Range Status   SARS Coronavirus 2 by RT PCR NEGATIVE NEGATIVE Final    Comment: (NOTE) SARS-CoV-2 target nucleic acids are NOT DETECTED.  The SARS-CoV-2 RNA is generally detectable in upper and lower respiratory specimens during the acute phase of infection. The lowest concentration of SARS-CoV-2 viral copies this assay can detect  is 250 copies / mL. A negative result does not preclude SARS-CoV-2 infection and should not be used as the sole basis for treatment or other patient management decisions.  A negative result may occur with improper specimen collection / handling, submission of specimen other than nasopharyngeal swab, presence of viral mutation(s) within the areas targeted by this assay, and inadequate number of viral copies (<250 copies / mL). A negative result must be combined with clinical observations, patient history, and epidemiological information.  Fact Sheet for Patients:   https://www.patel.info/  Fact Sheet for Healthcare Providers: https://hall.com/  This test is not yet approved or  cleared by the [place] FDA and has been authorized for detection and/or diagnosis of SARS-CoV-2 by FDA under an Emergency Use Authorization (EUA).  This EUA will remain in effect (meaning this test can be used) for the duration of the COVID-19 declaration under Section 564(b)(1) of the Act, 21 U.S.C. section 360bbb-3(b)(1), unless the authorization is terminated or revoked sooner.  Performed at [facility], [place] [address], [place], [place] [address]   Blood Culture (routine x 2)     Status: None (Preliminary result)   Collection Time: 10/19/21  6:17 AM   Specimen: BLOOD  Result Value Ref Range Status   Specimen Description BLOOD SITE NOT SPECIFIED  Final   Special Requests   Final    BOTTLES DRAWN AEROBIC AND ANAEROBIC Blood Culture results may not be optimal due to an inadequate volume of blood received in culture bottles   Culture   Final    NO GROWTH 3 DAYS Performed at [facility], [place] [address], [place], [place] [address]    Report Status PENDING  Incomplete  MRSA Next Gen by PCR, Nasal     Status: None   Collection Time: 10/19/21  2:00 PM  Result Value Ref Range Status   MRSA by PCR Next Gen NOT DETECTED NOT DETECTED Final     Comment: (NOTE) The GeneXpert MRSA Assay (FDA approved for NASAL specimens only), is one component of a comprehensive MRSA colonization surveillance program. It is not intended to diagnose MRSA infection nor to guide or monitor treatment for MRSA infections. Test performance is not FDA approved in patients less than 24 years old. Performed at [facility], [place] [address] [place], [place] [address]     RADIOLOGY STUDIES/RESULTS: No results found.   LOS: 3 days   Oren Binet, MD  [facility]    To contact the attending provider between 7A-7P or the covering provider during after hours 7P-7A, please log into the web site [email or web address] and access using universal [place] password for that web site. If you do not have the password, please call the hospital operator.  10/22/2021, 1:54 PM

## 2021-10-23 ENCOUNTER — Other Ambulatory Visit (HOSPITAL_COMMUNITY): Payer: Self-pay

## 2021-10-23 ENCOUNTER — Inpatient Hospital Stay (HOSPITAL_COMMUNITY): Payer: Medicare PPO

## 2021-10-23 DIAGNOSIS — N390 Urinary tract infection, site not specified: Secondary | ICD-10-CM

## 2021-10-23 MED ORDER — ISOSORBIDE MONONITRATE ER 60 MG PO TB24: 60.0000 mg | ORAL_TABLET | Freq: Every evening | ORAL | Status: DC

## 2021-10-23 MED ORDER — CEFUROXIME AXETIL 250 MG PO TABS
250.0000 mg | ORAL_TABLET | Freq: Every day | ORAL | 0 refills | Status: DC
  Filled 2021-10-23: qty 2, 2d supply, fill #0

## 2021-10-23 MED ORDER — METOPROLOL TARTRATE 50 MG PO TABS: 50.0000 mg | ORAL_TABLET | Freq: Two times a day (BID) | ORAL | 0 refills | Status: DC

## 2021-10-23 NOTE — Care Management Important Message (Signed)
Important Message  Patient Details  Name: Dana Jackson MRN: [id number] Date of Birth: October 07, 1945   Medicare Important Message Given:  Yes  Patient left prior to IM delivery will mail to the patient home address.   Daviona Herbert 10/23/2021, 3:32 PM

## 2021-10-23 NOTE — TOC Transition Note (Signed)
Transition of Care [facility]) - CM/SW Discharge Note   Patient Details  Name: Dana Jackson MRN: [id number] Date of Birth: September 10, 1945  Transition of [facility] CM/SW Contact:  Verdell Carmine, RN Phone Number: 10/23/2021, 10:30 AM   Clinical Narrative:     Patient discharging today. Cory from [employer] about DC and orders in for PT OT.  No further needs identified  Final next level of care: [place] Barriers to Discharge: No Barriers Identified   Patient Goals and CMS Choice Patient states their goals for this hospitalization and ongoing recovery are:: return home CMS Medicare.gov Compare Post Acute Care list provided to:: Patient Represenative (must comment) Choice offered to / list presented to : Spouse  Discharge Placement                       Discharge Plan and Services   Discharge Planning Services: CM Consult Post Acute Care Choice: Home Health, Durable Medical Equipment          DME Arranged: Walker rolling with seat DME Agency: [employer]       HH Arranged: OT HH Agency: [facility] Date [place]: 10/23/21 Time [place]: [phone] Representative spoke with at [place]: [place] (Roberta) Interventions     Readmission Risk Interventions    04/29/2019    1:48 PM  Readmission Risk Prevention Plan  Transportation Screening Complete  Medication Review Press photographer) Complete  PCP or Specialist appointment within 3-5 days of discharge Complete  HRI or [place] Complete  SW Recovery Care/Counseling Consult Complete  [place] Not Applicable

## 2021-10-23 NOTE — Progress Notes (Signed)
Occupational Therapy Treatment Patient Details Name: Dana Jackson MRN: [id number] DOB: 24-Dec-1945 Today's Date: 10/23/2021   History of present illness Dana Jackson is a 76 y.o. female admitted 10/9  who presents to the emergency department for several weeks of cough who developed a fever and emesis today. Also desaturation.  Pt found to have sepsis. PMH: ESRD on dialysis TTS, lung cancer status post chemo/radiation, recent NSTEMI with three-vessel disease treated with medical management.   OT comments  Pt progressing well towards OT goals.  Plan for dc home today with spouses support.  Patient completing transfers and mobility in room with close supervision, toileting and grooming with supervision.  She was educated on energy conservation techniques and safety for ADL engagement. On RA with SpO2 stable, HR up to 120s during Adls.   Recommendations for follow up therapy are one component of a multi-disciplinary discharge planning process, led by the attending physician.  Recommendations may be updated based on patient status, additional functional criteria and insurance authorization.    Follow Up Recommendations  No OT follow up    Assistance Recommended at Discharge Intermittent Supervision/Assistance  Patient can return home with the following  A little help with walking and/or transfers;A little help with bathing/dressing/bathroom;Assistance with cooking/housework;Assist for transportation;Help with stairs or ramp for entrance   Equipment Recommendations  None recommended by OT    Recommendations for Other Services      Precautions / Restrictions Precautions Precautions: Fall Precaution Comments: watch SpO2/HR Restrictions Weight Bearing Restrictions: No       Mobility Bed Mobility Overal bed mobility: Independent                  Transfers Overall transfer level: Needs assistance Equipment used: None Transfers: Sit to/from Stand Sit to Stand: Supervision                  Balance Overall balance assessment: Needs assistance Sitting-balance support: No upper extremity supported, Feet supported Sitting balance-Leahy Scale: Fair     Standing balance support: No upper extremity supported, During functional activity Standing balance-Leahy Scale: Fair                             ADL either performed or assessed with clinical judgement   ADL Overall ADL's : Needs assistance/impaired     Grooming: Supervision/safety;Wash/dry hands;Standing               Lower Body Dressing: Supervision/safety;Sit to/from stand   Toilet Transfer: Supervision/safety;Ambulation;Regular Toilet   Toileting- Water quality scientist and Hygiene: Supervision/safety;Sit to/from stand       Functional mobility during ADLs: Supervision/safety;Cueing for safety General ADL Comments: pt on RA, SpO2 stable.  HR up to 120s during ADLs.    Extremity/Trunk Assessment              Vision       Perception     Praxis      Cognition Arousal/Alertness: Awake/alert Behavior During Therapy: WFL for tasks assessed/performed Overall Cognitive Status: Within Functional Limits for tasks assessed                                          Exercises      Shoulder Instructions       General Comments reviewed energy conservation techniques for ADL engagement.    Pertinent Vitals/ Pain  Pain Assessment Pain Assessment: No/denies pain  Home Living                                          Prior Functioning/Environment              Frequency  Min 2X/week        Progress Toward Goals  OT Goals(current goals can now be found in the care plan section)  Progress towards OT goals: Progressing toward goals  Acute Rehab OT Goals Patient Stated Goal: home today! OT Goal Formulation: With patient Time For Goal Achievement: 11/04/21 Potential to Achieve Goals: Good  Plan Discharge plan  remains appropriate;Frequency remains appropriate    Co-evaluation                 AM-PAC OT "6 Clicks" Daily Activity     Outcome Measure   Help from another person eating meals?: None Help from another person taking care of personal grooming?: A Little Help from another person toileting, which includes using toliet, bedpan, or urinal?: A Little Help from another person bathing (including washing, rinsing, drying)?: A Little Help from another person to put on and taking off regular upper body clothing?: A Little Help from another person to put on and taking off regular lower body clothing?: A Little 6 Click Score: 19    End of Session    OT Visit Diagnosis: Muscle weakness (generalized) (M62.81);Other (comment) (cardiopulmonary status)   Activity Tolerance Patient tolerated treatment well   Patient Left in chair;with call bell/phone within reach;with family/visitor present   Nurse Communication Mobility status        Time: 1043-1101 OT Time Calculation (min): 18 min  Charges: OT General Charges $OT Visit: 1 Visit OT Treatments $Self Care/Home Management : 8-22 mins  [name], [place] Office [phone]'   [name] 10/23/2021, 11:27 AM

## 2021-10-23 NOTE — Discharge Summary (Addendum)
PATIENT DETAILS Name: Dana Jackson Age: 76 y.o. Sex: female Date of Birth: 06-27-1945 MRN: [id number]. Admitting Physician: Jacky Kindle, MD RDE:[id number], Gracelyn Nurse, PA-C  Admit Date: 10/19/2021 Discharge date: 10/23/2021  Recommendations for Outpatient Follow-up:  Follow up with PCP in 1-2 weeks Please obtain CMP/CBC in one week Incidental finding of cystic lesion in pancreas on CT imaging-needs repeat MRI in 6 months Continue outpatient surveillance with periodic scans for pulmonary nodules.  Admitted From:  Home  Disposition: Home   Discharge Condition: good  CODE STATUS:   Code Status: Full Code   Diet recommendation:  Diet Order             Diet - low sodium heart healthy           Diet Heart Room service appropriate? Yes; Fluid consistency: Thin  Diet effective now                    Brief Summary: Patient is a 76 y.o.  female with history of ESRD on HD TTS, recent hospitalization for non-STEMI-who presented to the hospital with fever, tachypnea-was found to have acute hypoxic respiratory failure in the setting of septic shock due to PNA and subsequently admitted to the ICU.  Patient required pressors/empiric antibiotics-stabilized and transferred to [facility] on 10/11.   Significant events: 10/3-10/4>> hospitalization for non-STEMI-LHC with diffuse disease-no targets for revascularization 10/9>> admit to ICU-septic shock/hypoxia 2/2 PNA   Significant studies: 10/9>> CXR: Right> left PNA 10/9>> CTA chest: No PE, multifocal PNA.   Significant microbiology data: 10/9>> COVID PCR: Negative 10/9>> blood culture: Negative 10/9>> urine culture: E. coli/Klebsiella pneumonia (likely colonization-symptoms more consistent with PNA)   Procedures: None   Consults: PCCM Nephrology  Brief Hospital Course: Septic shock Acute hypoxic respiratory failure Multifocal PNA Sepsis physiology resolved Transitioned to Room air Was on Zosyn-but since clinically  improved-we will switch to oral antibiotics on discharge. Speech Therapy evaluation completed-MBS without any evidence of aspiration.  Asymptomatic bacteriuria Covered by above antibiotics in any event-no symptoms of UTI.   CAD Recent non-STEMI No anginal symptoms Continue ASA/Plavix/Lipitor-resume beta-blocker on discharge.   ESRD on HD TTS Nephrology followed closely-resume outpatient HD schedule.   Macrocytic anemia Due to ESRD/acute illness B12 levels borderline-but appears to be on vitamin B12 injections per MAR. Folate stable Monitor CBC periodically in the outpatient setting.   Hyponatremia Mild Should improve with further volume removal with HD   HTN BP now creeping up-initially all antihypertensives were held-should be okay to resume Imdur/beta-blocker on discharge.   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 Note-finding discussed with patient/spouse on 10/13-all aware of risk for malignancy and need for follow-up.   BMI: Estimated body mass index is 25.7 kg/m as calculated from the following:   Height as of this encounter: 5' (1.524 m).   Weight as of this encounter: 59.7 kg.    Discharge Diagnoses:  Principal Problem:   Septic shock ([place]) Active Problems:   Pneumonia of both lower lobes due to infectious organism   Discharge Instructions:  Activity:  As tolerated Discharge Instructions     Diet - low sodium heart healthy   Complete by: As directed    Discharge instructions   Complete by:  As directed    Follow with Primary MD  Heywood Bene, PA-C in 1-2 weeks  Incidental finding of pancreatic lesion-Per radiology-needs outpatient MRI in 6  months.  Incidental finding-pulmonary nodules-continue outpatient surveillance as previous.  Please get a complete blood count and chemistry panel checked by your Primary MD at your next visit, and again as instructed by your Primary MD.  Get Medicines reviewed and adjusted: Please take all your medications with you for your next visit with your Primary MD  Laboratory/radiological data: Please request your Primary MD to go over all hospital tests and procedure/radiological results at the follow up, please ask your Primary MD to get all Hospital records sent to his/her office.  In some cases, they will be blood work, cultures and biopsy results pending at the time of your discharge. Please request that your primary care M.D. follows up on these results.  Also Note the following: If you experience worsening of your admission symptoms, develop shortness of breath, life threatening emergency, suicidal or homicidal thoughts you must seek medical attention immediately by calling 911 or calling your MD immediately  if symptoms less severe.  You must read complete instructions/literature along with all the possible adverse reactions/side effects for all the Medicines you take and that have been prescribed to you. Take any new Medicines after you have completely understood and accpet all the possible adverse reactions/side effects.   Do not drive when taking Pain medications or sleeping medications (Benzodaizepines)  Do not take more than prescribed Pain, Sleep and Anxiety Medications. It is not advisable to combine anxiety,sleep and pain medications without talking with your primary care practitioner  Special Instructions: If you have smoked or chewed Tobacco  in the last 2 yrs please stop smoking, stop any regular Alcohol  and or any Recreational drug use.  Wear Seat belts while driving.  Please note: You were cared for by a hospitalist during your hospital stay. Once you are discharged, your  primary care physician will handle any further medical issues. Please note that NO REFILLS for any discharge medications will be authorized once you are discharged, as it is imperative that you return to your primary care physician (or establish a relationship with a primary care physician if you do not have one) for your post hospital discharge needs so that they can reassess your need for medications and monitor your lab values.   Increase activity slowly   Complete by: As directed       Allergies as of 10/23/2021       Reactions   Sulfa Antibiotics Other (See Comments)   "AKI"   Erythromycin Itching   Pravastatin Other (See Comments)   Unknown reaction   Simvastatin Other (See Comments)   Reaction not known   Codeine Nausea Only, Other (See Comments)   Tolerate Hycodan   Gemfibrozil Itching        Medication List     STOP taking these medications    furosemide 20 MG tablet Commonly known as: LASIX       TAKE these medications    acetaminophen 500 MG tablet Commonly known as: TYLENOL Take 1,000 mg by mouth every 6 (six) hours as needed for moderate pain.   Align 4 MG Caps Take 4 mg by mouth daily.   Alpha-Lipoic Acid 600 MG Caps Take 600 mg by mouth daily.   aspirin 81 MG tablet Take 81 mg by mouth at bedtime.   atorvastatin 80 MG tablet Commonly known as: LIPITOR Take  1 tablet (80 mg total) by mouth at bedtime.   benzonatate 100 MG capsule Commonly known as: TESSALON Take 100-200 mg by mouth 3 (three) times daily as needed for cough.   calcitRIOL 0.25 MCG capsule Commonly known as: ROCALTROL Take by mouth.   cefUROXime 250 MG tablet Commonly known as: CEFTIN Take 1 tablet (250 mg total) by mouth daily. Take after dialysis on dialysis days.   cinacalcet 30 MG tablet Commonly known as: SENSIPAR Take 30 mg by mouth daily with breakfast.   cinacalcet 30 MG tablet Commonly known as: SENSIPAR Take 30 mg by mouth daily with breakfast.   clopidogrel  75 MG tablet Commonly known as: PLAVIX Take 1 tablet (75 mg total) by mouth daily. What changed: Another medication with the same name was removed. Continue taking this medication, and follow the directions you see here.   cyanocobalamin 1000 MCG/ML injection Commonly known as: VITAMIN B12 Inject 1 mL (1,000 mcg total) into the muscle every 30 (thirty) days for 90 doses.   Darbepoetin Alfa 60 MCG/0.3ML Sosy injection Commonly known as: ARANESP Inject 0.3 mLs (60 mcg total) into the vein every Tuesday with hemodialysis.   estradiol 0.1 MG/GM vaginal cream Commonly known as: ESTRACE Place 1 Applicatorful vaginally 3 (three) times a week.   famotidine 20 MG tablet Commonly known as: PEPCID Take 1 tablet (20 mg total) by mouth at bedtime.   Fish Oil 1200 MG Caps Take 2,400 mg by mouth in the morning and at bedtime.   gabapentin 100 MG capsule Commonly known as: NEURONTIN Take 100 mg by mouth 2 (two) times daily. What changed: Another medication with the same name was removed. Continue taking this medication, and follow the directions you see here.   iron sucrose in sodium chloride 0.9 % 100 mL Iron Sucrose (Venofer)   iron sucrose in sodium chloride 0.9 % 100 mL Iron Sucrose (Venofer)   isosorbide mononitrate 60 MG 24 hr tablet Commonly known as: IMDUR Take 1 tablet (60 mg total) by mouth at bedtime. Start taking on: October 25, 2021 What changed:  when to take this These instructions start on October 25, 2021. If you are unsure what to do until then, ask your doctor or other care provider.   lidocaine-prilocaine cream Commonly known as: EMLA Apply 1 application topically Every Tuesday,Thursday,and Saturday with dialysis.   loperamide 2 MG tablet Commonly known as: IMODIUM A-D Take 4 mg by mouth as needed for diarrhea or loose stools.   LORATADINE PO Take 10 mg by mouth daily.   magnesium oxide 400 MG tablet Commonly known as: MAG-OX Take 400 mg by mouth daily.    metoprolol tartrate 50 MG tablet Commonly known as: LOPRESSOR Take 1 tablet (50 mg total) by mouth 2 (two) times daily. schedule an appointment for further refills, 1st attempt Start taking on: October 24, 2021   MIRCERA IJ Mircera   nitroGLYCERIN 0.4 MG SL tablet Commonly known as: NITROSTAT PLACE 1 TABLET UNDER THE TONGUE EVERY 5 MINUTES AS NEEDED FOR CHEST PAIN. What changed:  how much to take how to take this when to take this reasons to take this additional instructions   omeprazole 40 MG capsule Commonly known as: PRILOSEC Take 40 mg by mouth daily before breakfast.   promethazine-dextromethorphan 6.25-15 MG/5ML syrup Commonly known as: PROMETHAZINE-DM Take 5 mLs by mouth 4 (four) times daily as needed for cough.   pyridOXINE 100 MG tablet Commonly known as: VITAMIN B6 Take 200 mg by mouth daily.  sevelamer carbonate 800 MG tablet Commonly known as: RENVELA Take 800 mg by mouth See admin instructions. Take 800 mg with each meal and each snack               Durable Medical Equipment  (From admission, onward)           Start     Ordered   10/21/21 1219  For home use only DME 4 wheeled rolling walker with seat  Once       Question:  Patient needs a walker to treat with the following condition  Answer:  Gait instability   10/21/21 1218            Follow-up Information     [facility] Follow up.   Specialty: Home Health Services Why: Home health has beena arranged. They will contact you within 48hrs post discharge. Contact information: [address] [place] [address] [phone]         Heywood Bene, PA-C. Schedule an appointment as soon as possible for a visit in 1 week(s).   Specialty: Physician Assistant Contact information: [address] [place] [address] [phone]         Leonie Man, MD Follow up in 1 month(s).   Specialty: Cardiology Contact information: [address] [place] [address] [place] [address] [phone]                Allergies  Allergen Reactions   Sulfa Antibiotics Other (See Comments)    "AKI"    Erythromycin Itching   Pravastatin Other (See Comments)    Unknown reaction   Simvastatin Other (See Comments)    Reaction not known   Codeine Nausea Only and Other (See Comments)    Tolerate Hycodan   Gemfibrozil Itching     Other Procedures/Studies: DG CHEST PORT 1 VIEW  Result Date: 10/23/2021 CLINICAL DATA:  Pulmonary edema, shortness of breath EXAM: PORTABLE CHEST 1 VIEW COMPARISON:  Portable exam 0539 hours compared to 10/19/2021 FINDINGS: Upper normal heart size post CABG. Mediastinal contours and pulmonary vascularity normal. Atherosclerotic calcification aorta. Mild bibasilar atelectasis. Lungs otherwise clear. Question underlying emphysematous changes. Trace LEFT pleural effusion. No pneumothorax. Prior cervical spine fusion. IMPRESSION: Post CABG. Bibasilar atelectasis and trace LEFT pleural effusion. Aortic Atherosclerosis (ICD10-I70.0). Electronically Signed   By: Lavonia Dana M.D.   On: 10/23/2021 08:12   DG Swallowing Func-Speech Pathology  Result Date: 10/22/2021 Table formatting from the original result was not included. Images from the original result were not included. Objective Swallowing Evaluation: Type of Study: MBS-Modified Barium Swallow Study  Patient Details Name: Dana Jackson MRN: [id number] Date of Birth: 20-Aug-1945 Today's Date: 10/22/2021 Time: SLP Start Time (ACUTE ONLY): 1330 -SLP Stop Time (ACUTE ONLY): 1350 SLP Time Calculation (min) (ACUTE ONLY): 20 min Past Medical History: Past Medical History: Diagnosis Date  Anemia   Arthritis   hands  Asthma   childhood  Bacteremia due to Escherichia coli 06/2013  Currently being treated with anti-biotics  CAD in native artery 12/2010  a) Cath for exertional angina & EKG changes: 40% LM, 80% mid LAD.  95% ost cX, 80-90% PDA --> CABG X3; b) Post CABG  CATH for + Myoview with basal anterior ischemia -> Ost LAD 80% (diffuse) then 100% after SP2, RI 70% (too small for PCI), Ost-prox Cx 99% & OM1 90%, OstrPDA 80% (small); Occluded SVG-rPDA.  Patent LIMA-dLAD, SVG-OM: culprit ~ p-m LAD pre-LIMA & RI - not good  PCI target --> Med Rx  CKD (chronic kidney disease) stage 3, GFR 30-59 ml/min (HCC)   Degenerative disc disease, cervical   Degenerative disc disease, cervical [722.4]  Diabetes mellitus without complication (HCC)   Type II  Dyspnea   Endometrial cancer ([place]) 11/2013  Treated with TAH with pelvic lymphadenectomy followed by radiation and chemotherapy  GERD (gastroesophageal reflux disease)   Hearing loss   bilateral - no hearing aids  Heart murmur   History of asthma    childhood  History of blood transfusion   History of pneumonia   "2-3 times"  History of unstable angina November/December 2012  T wave inversions in inferolateral leads.  No stress test performed.  Hyperlipidemia LDL goal <70   Hypertension, essential, benign   Neuropathy   hands  Pneumonia 2018  Renal cell carcinoma ([place]) 11/18/2008  T2aNx s/p partial left nephrectomy  S/P CABG x 3 12/2010  LIMA-LAD, SVG RPL, SVG-Circumflex  Stroke ([place])   TIA- no residual   TIA (transient ischemic attack) 1992 &  2010 Past Surgical History: Past Surgical History: Procedure Laterality Date  ANTERIOR CERVICAL DECOMP/DISCECTOMY FUSION  10/11/2005  multi-level  AV FISTULA PLACEMENT Left 08/03/2017  Procedure: ARTERIOVENOUS (AV) FISTULA CREATION LEFT ARM;  Surgeon: Rosetta Posner, MD;  Location: [place];  Service: Vascular;  Laterality: Left;  BRONCHIAL BIOPSY  12/30/2020  Procedure: BRONCHIAL BIOPSIES;  Surgeon: Garner Nash, DO;  Location: [facility];  Service: Pulmonary;;  BRONCHIAL BIOPSY  02/03/2021  Procedure: BRONCHIAL BIOPSIES;  Surgeon: Garner Nash, DO;  Location: [facility];  Service: Pulmonary;;  BRONCHIAL BRUSHINGS  12/30/2020  Procedure: BRONCHIAL BRUSHINGS;  Surgeon: Garner Nash, DO;   Location: [place] [facility];  Service: Pulmonary;;  BRONCHIAL BRUSHINGS  02/03/2021  Procedure: BRONCHIAL BRUSHINGS;  Surgeon: Garner Nash, DO;  Location: [facility];  Service: Pulmonary;;  BRONCHIAL NEEDLE ASPIRATION BIOPSY  12/30/2020  Procedure: BRONCHIAL NEEDLE ASPIRATION BIOPSIES;  Surgeon: Garner Nash, DO;  Location: [facility];  Service: Pulmonary;;  BRONCHIAL NEEDLE ASPIRATION BIOPSY  02/03/2021  Procedure: BRONCHIAL NEEDLE ASPIRATION BIOPSIES;  Surgeon: Garner Nash, DO;  Location: [facility];  Service: Pulmonary;;  CARDIAC CATHETERIZATION  12/24/10  40% left main, 80% mid LAD, 95% ostial circumflex, 80-90% PDA.  CARDIAC CATHETERIZATION N/A 07/01/2014  Procedure: Left Heart Cath and Cors/Grafts Angiography;  Surgeon: Leonie Man, MD;  [facility]: [facility] LAB:  For Abn Nuc @ [facility]: Ost LAD 80%, mid LAD 100% after S/P 2, 70% RI (no PCI target), ost-prox Cx 99%, OM1 90%. Ost rPDA 80% (~ pre-CABG), occluded SVG-rPDA, patent LIMA-dLAD, SVG OM; potential culprit for abn Nuc scan = pLAD Dz, small RI 70% or PDA -> med Rx  CAROTID ENDARTERECTOMY Left   CHOLECYSTECTOMY  ~ [place] GRAFT  12/25/2010  Procedure: CORONARY ARTERY BYPASS GRAFTING (CABGX3 - LIMA-LAD, SVG RPL, SVG-Circumflex);  Surgeon: Rexene Alberts, MD;  Location: [facility];  Service: Open Heart Surgery;  Laterality: N/A;  DILATATION & CURETTAGE/HYSTEROSCOPY WITH MYOSURE N/A 11/02/2013  Procedure: DILATATION & CURETTAGE/HYSTEROSCOPY WITH MYOSURE ABLATION;  Surgeon: Allena Katz, MD;  Location: [facility];  Service: Gynecology;  Laterality: N/A;  DOPPLER ECHOCARDIOGRAPHY  12/08/2010  EF =>55%,MILD CONCENTRIC LEFT VENTRICULAR HYPERTROPHY  EYE SURGERY Bilateral   Cataract  IR DIALY SHUNT INTRO NEEDLE/INTRACATH INITIAL W/IMG LEFT Left 04/24/2019  IR FLUORO GUIDE CV LINE LEFT  04/26/2019  IR US GUIDE VASC ACCESS LEFT  04/24/2019  IR US GUIDE  VASC ACCESS LEFT  04/26/2019  LEFT HEART CATH AND CORS/GRAFTS  ANGIOGRAPHY N/A 10/13/2021  Procedure: LEFT HEART CATH AND CORS/GRAFTS ANGIOGRAPHY;  Surgeon: Belva Crome, MD;  Location: [facility] CV [facility];  Service: Cardiovascular;  Laterality: N/A;  LEFT HEART CATHETERIZATION WITH CORONARY ANGIOGRAM N/A 12/24/2010  Procedure: LEFT HEART CATHETERIZATION WITH CORONARY ANGIOGRAM;  Surgeon: Leonie Man, MD;  Location: [facility] CATH LAB;  Service: Cardiovascular;  Laterality: N/A;  NM MYOCAR SINGLE W/SPECT  07/26/2007  EF 79%, LEFT VENT.FUNCTION NORMAL  PARTIAL NEPHRECTOMY Left 11/18/2008  left partial nephrectomy for renal cell CA  PET Myocardial Perfusion Scan  06/13/2014  At [place]: Moderate size, mild severity completely reversible defect involving the basal anterior, mid anterior and apical anterior segments consistent with ischemia. EF 65% with normal global function.  TONSILLECTOMY    "in college"  TOTAL ABDOMINAL HYSTERECTOMY  November 2015   At [facility]: Robotic procedure with pelvic lymphadenectomy  TRANSTHORACIC ECHOCARDIOGRAM  06/13/2014  At [place]: EF 60-65%. Grade 1 diastolic dysfunction. Mild MR. Aortic sclerosis. Moderate pulmonary hypertension  TUBAL LIGATION  ~ 1984  UPPER GI ENDOSCOPY    VIDEO BRONCHOSCOPY WITH RADIAL ENDOBRONCHIAL ULTRASOUND  12/30/2020  Procedure: RADIAL ENDOBRONCHIAL ULTRASOUND;  Surgeon: Garner Nash, DO;  Location: [facility];  Service: Pulmonary;;  VIDEO BRONCHOSCOPY WITH RADIAL ENDOBRONCHIAL ULTRASOUND  02/03/2021  Procedure: RADIAL ENDOBRONCHIAL ULTRASOUND;  Surgeon: Garner Nash, DO;  Location[facility];  Service: Pulmonary;;  WISDOM TOOTH EXTRACTION   HPI: Patient is a 76 y.o. 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 She was admitted on 10/19/21 to ICU after presenting to the ED with c/o several weeks cough and developing fever and  emesis. Patient found to be hypoxic with sats of 58% on RA and was placed on NRB.  Subjective: pleasant, cooperative  Recommendations for follow up therapy are one component of a multi-disciplinary discharge planning process, led by the attending physician.  Recommendations may be updated based on patient status, additional functional criteria and insurance authorization. Assessment / Plan / Recommendation   10/22/2021   2:27 PM Clinical Impressions Clinical Impression Patient presents with a mild pharyngeal phase dysphagia but with oral and cervical-esophageal phases of swallow appearing WFL. During pharyngeal phase, patient exhibited instances of flash penetration above vocal cords with thin liquids (no significant difference with cup sips versus straw sips) with full clearance of penetrate from vestibule. No aspiration observed at any phase of the swallow and no pharyngeal residuals observed post swallows. 13 mm barium tablet transited pharyngeally and through cervical esophagus without difficulty. Esophageal sweep did not reveal any significant dysmotility or stasis of barium. Patient appears safe to continue on regular texture solids, thin liquids and aspiration risk is mild. SLP Visit Diagnosis Dysphagia, pharyngeal phase (R13.13) Impact on safety and function No limitations;Mild aspiration risk     10/22/2021   2:27 PM Treatment Recommendations Treatment Recommendations No treatment recommended at this time     10/20/2021  12:42 PM Prognosis Prognosis for Safe Diet Advancement Good   10/22/2021   2:27 PM Diet Recommendations SLP Diet Recommendations Regular solids;Thin liquid Liquid Administration via Cup;Straw Medication Administration Whole meds with liquid Compensations Slow rate;Small sips/bites Postural Changes Seated upright at 90 degrees     10/22/2021   2:27 PM Other Recommendations Follow Up Recommendations No SLP follow up Assistance  recommended at discharge PRN Functional Status Assessment Patient  has had a recent decline in their functional status and demonstrates the ability to make significant improvements in function in a reasonable and predictable amount of time.    No data to display        10/22/2021   2:26 PM Oral Phase Oral Phase [facility]    10/22/2021   2:26 PM Pharyngeal Phase Pharyngeal Phase Impaired Pharyngeal- Thin Cup Reduced epiglottic inversion Pharyngeal- Thin Straw Reduced epiglottic inversion;Penetration/Aspiration during swallow Pharyngeal Material enters airway, remains ABOVE vocal cords then ejected out Pharyngeal- Puree WFL Pharyngeal- Regular WFL Pharyngeal- Pill [facility]    10/22/2021   2:27 PM Cervical Esophageal Phase  Cervical Esophageal Phase WFL Sonia Baller, MA, CCC-SLP Speech Therapy                     CT Angio Chest PE W and/or Wo Contrast  Result Date: 10/19/2021 CLINICAL DATA:  Hypoxia and elevated D-dimer. Evaluate for pulmonary embolism. Also lower abdominal pain and fever. 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 EXAM: CT ANGIOGRAPHY CHEST CT ABDOMEN AND PELVIS WITH CONTRAST TECHNIQUE: Multidetector CT imaging of the chest was performed using the standard protocol during bolus administration of intravenous contrast. Multiplanar CT image reconstructions and MIPs were obtained to evaluate the vascular anatomy. Multidetector CT imaging of the abdomen and pelvis was performed using the standard protocol during bolus administration of intravenous contrast. RADIATION DOSE REDUCTION: This exam was performed according to the departmental dose-optimization program which includes automated exposure control, adjustment of the mA and/or kV according to patient size and/or use of iterative reconstruction technique. CONTRAST:  64m OMNIPAQUE IOHEXOL 350 MG/ML SOLN COMPARISON:  CT chest 08/26/2021 FINDINGS: CTA CHEST FINDINGS Cardiovascular: Borderline cardiomegaly. Evidence of median  sternotomy and prior CABG. Calcification over the mitral valve annulus. Thoracic aorta is normal caliber. There is calcified plaque over the descending thoracic aorta as there is focal noncalcified mural thrombus laterally over the mid descending thoracic aorta. Pulmonary arterial system is well opacified without evidence of emboli. Mediastinum/Nodes: 1.2 cm precarinal lymph node likely reactive. No other significant mediastinal or hilar adenopathy. Remaining mediastinal structures are unremarkable. 1.2 cm left thyroid nodule as no follow-up imaging is recommended. Lungs/Pleura: Lungs are adequately inflated. Stable 4 mm nodule over the right upper lobe (image 73). Previously seen 7 mm right middle lobe nodule is obscured by airspace opacification. Stable 4 mm nodule over the left upper lobe (image 68). Two small nodules over the left lower lobe unchanged (image 81 and 82) but partially obscured by airspace opacification. Persistent focal airspace density over the left lower lobe unchanged from previous exam. There is new patchy bilateral hazy airspace opacification most prominent over the right mid to upper lung likely multifocal infection. Tiny amount right pleural fluid. Mild posterior bibasilar atelectasis. Airways are unremarkable. Musculoskeletal: No new findings. Review of the MIP images confirms the above findings. CT ABDOMEN and PELVIS FINDINGS Hepatobiliary: Previous cholecystectomy. Liver and biliary tree are otherwise unchanged. Pancreas: 1.7 cm cystic focus over the body of the pancreas in the midline (previously 1.3 cm MRI 2020). Spleen: Normal. Adrenals/Urinary Tract: Adrenal glands are normal. Small left kidney compatible previous partial nephrectomy for renal cell carcinoma. Multiple hypodense left renal cortical lesions unchanged likely cysts. Calcification changes in the left perinephric space which are stable. Multiple right renal cysts unchanged. 3 mm calcification over the upper pole  right  kidney which may represent a small stone. No evidence of hydronephrosis. Ureters and bladder are normal. Stomach/Bowel: Stomach and small bowel are normal. Appendix not visualized. Mild fecal retention over the transverse colon as the colon is otherwise unremarkable. Vascular/Lymphatic: Calcified plaque over the abdominal aorta which is normal in caliber. No adenopathy. Reproductive: Previous hysterectomy. Other: No free fluid or focal inflammatory change. Musculoskeletal: Severe degenerative changes of the spine with multilevel disc disease and curvature of the lumbar spine convex left. Review of the MIP images confirms the above findings. IMPRESSION: 1. No evidence of pulmonary embolism. 2. New patchy bilateral hazy airspace opacification most prominent over the right mid to upper lung likely multifocal infection. Tiny amount of right pleural fluid. Persistent focal airspace density over the left lower lobe unchanged from previous exam. Stable bilateral pulmonary nodules as described. Recommend attention on follow-up in this patient with known underlying malignancy. 3. No acute findings in the abdomen/pelvis. 4. 1.7 cm cystic focus over the body of the pancreas in the midline (previously 1.3 cm MRI 2020). Recommend follow-up abdominal MRI in 6 months. 5. Evidence of previous partial left nephrectomy for renal cell carcinoma. Bilateral renal cysts unchanged. 3 mm nonobstructing right renal stone. 6. Aortic atherosclerosis. Aortic Atherosclerosis (ICD10-I70.0). Electronically Signed   By: Marin Olp M.D.   On: 10/19/2021 11:45   CT ABDOMEN PELVIS W CONTRAST  Result Date: 10/19/2021 CLINICAL DATA:  Hypoxia and elevated D-dimer. Evaluate for pulmonary embolism. Also lower abdominal pain and fever. 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 EXAM: CT ANGIOGRAPHY CHEST CT ABDOMEN AND PELVIS WITH CONTRAST TECHNIQUE:  Multidetector CT imaging of the chest was performed using the standard protocol during bolus administration of intravenous contrast. Multiplanar CT image reconstructions and MIPs were obtained to evaluate the vascular anatomy. Multidetector CT imaging of the abdomen and pelvis was performed using the standard protocol during bolus administration of intravenous contrast. RADIATION DOSE REDUCTION: This exam was performed according to the departmental dose-optimization program which includes automated exposure control, adjustment of the mA and/or kV according to patient size and/or use of iterative reconstruction technique. CONTRAST:  47m OMNIPAQUE IOHEXOL 350 MG/ML SOLN COMPARISON:  CT chest 08/26/2021 FINDINGS: CTA CHEST FINDINGS Cardiovascular: Borderline cardiomegaly. Evidence of median sternotomy and prior CABG. Calcification over the mitral valve annulus. Thoracic aorta is normal caliber. There is calcified plaque over the descending thoracic aorta as there is focal noncalcified mural thrombus laterally over the mid descending thoracic aorta. Pulmonary arterial system is well opacified without evidence of emboli. Mediastinum/Nodes: 1.2 cm precarinal lymph node likely reactive. No other significant mediastinal or hilar adenopathy. Remaining mediastinal structures are unremarkable. 1.2 cm left thyroid nodule as no follow-up imaging is recommended. Lungs/Pleura: Lungs are adequately inflated. Stable 4 mm nodule over the right upper lobe (image 73). Previously seen 7 mm right middle lobe nodule is obscured by airspace opacification. Stable 4 mm nodule over the left upper lobe (image 68). Two small nodules over the left lower lobe unchanged (image 81 and 82) but partially obscured by airspace opacification. Persistent focal airspace density over the left lower lobe unchanged from previous exam. There is new patchy bilateral hazy airspace opacification most prominent over the right mid to upper lung likely multifocal  infection. Tiny amount right pleural fluid. Mild posterior bibasilar atelectasis. Airways are unremarkable. Musculoskeletal: No new findings. Review of the MIP images confirms the above findings. CT ABDOMEN and PELVIS  FINDINGS Hepatobiliary: Previous cholecystectomy. Liver and biliary tree are otherwise unchanged. Pancreas: 1.7 cm cystic focus over the body of the pancreas in the midline (previously 1.3 cm MRI 2020). Spleen: Normal. Adrenals/Urinary Tract: Adrenal glands are normal. Small left kidney compatible previous partial nephrectomy for renal cell carcinoma. Multiple hypodense left renal cortical lesions unchanged likely cysts. Calcification changes in the left perinephric space which are stable. Multiple right renal cysts unchanged. 3 mm calcification over the upper pole right kidney which may represent a small stone. No evidence of hydronephrosis. Ureters and bladder are normal. Stomach/Bowel: Stomach and small bowel are normal. Appendix not visualized. Mild fecal retention over the transverse colon as the colon is otherwise unremarkable. Vascular/Lymphatic: Calcified plaque over the abdominal aorta which is normal in caliber. No adenopathy. Reproductive: Previous hysterectomy. Other: No free fluid or focal inflammatory change. Musculoskeletal: Severe degenerative changes of the spine with multilevel disc disease and curvature of the lumbar spine convex left. Review of the MIP images confirms the above findings. IMPRESSION: 1. No evidence of pulmonary embolism. 2. New patchy bilateral hazy airspace opacification most prominent over the right mid to upper lung likely multifocal infection. Tiny amount of right pleural fluid. Persistent focal airspace density over the left lower lobe unchanged from previous exam. Stable bilateral pulmonary nodules as described. Recommend attention on follow-up in this patient with known underlying malignancy. 3. No acute findings in the abdomen/pelvis. 4. 1.7 cm cystic focus  over the body of the pancreas in the midline (previously 1.3 cm MRI 2020). Recommend follow-up abdominal MRI in 6 months. 5. Evidence of previous partial left nephrectomy for renal cell carcinoma. Bilateral renal cysts unchanged. 3 mm nonobstructing right renal stone. 6. Aortic atherosclerosis. Aortic Atherosclerosis (ICD10-I70.0). Electronically Signed   By: Marin Olp M.D.   On: 10/19/2021 11:45   DG Chest Port 1 View  Result Date: 10/19/2021 CLINICAL DATA:  Shortness of breath.  Respiratory distress. EXAM: PORTABLE CHEST 1 VIEW COMPARISON:  10/13/2021 FINDINGS: 0620 hours. The cardio pericardial silhouette is enlarged. Right greater than left basilar airspace disease with superimposed atelectasis at the left base. No substantial pleural effusion. Telemetry leads overlie the chest. IMPRESSION: Right greater than left basilar airspace disease. Imaging features could be related to dependent edema or infection. Electronically Signed   By: Misty Stanley M.D.   On: 10/19/2021 06:29   CARDIAC CATHETERIZATION  Result Date: 10/13/2021 CONCLUSIONS: New culprit total occlusion of SVG to the obtuse marginal Chronic total occlusion of SVG to PDA Patent LIMA to apical LAD which is totally occluded proximal to the graft insertion site and in the apical segment beyond the graft and insertion site. 70% distal left main.  70% ostial LAD and sequential 80% stenoses in the proximal and mid LAD Ostial 95 to 99% heavily calcified circumflex with severe diffuse disease in the dominant obtuse marginal. Moderate mid RCA disease and 70 to 80% distal disease.  95% stenosis in the PDA and 70% proximal LV branch disease. Preserved LV function with EF approximately 50 to 60% with EDP 12 mmHg. RECOMMENDATIONS: There are no good PCI or surgical options.  We will have other team members to look at pictures and give their opinion.  If refractory angina unresponsive to medical therapy we could treat the right coronary however it is the  least severely diseased vessel of the 3 major epicardial coronaries.  I do not believe repeat surgery is an option at her age and with end-stage kidney disease.   ECHOCARDIOGRAM COMPLETE  Result  Date: 10/13/2021    ECHOCARDIOGRAM REPORT   Patient Name:   EVALISE ABRUZZESE Date of Exam: 10/13/2021 Medical Rec #:  [id number]      Height:       60.0 in Accession #:    [id number]     Weight:       138.7 lb Date of Birth:  29-Mar-1945     BSA:          1.598 m Patient Age:    57 years       BP:           156/63 mmHg Patient Gender: F              HR:           65 bpm. Exam Location:  Inpatient Procedure: 2D Echo Indications:    NSTEMI  History:        Patient has prior history of Echocardiogram examinations, most                 recent 03/18/2019. CAD; Risk Factors:Hypertension, Diabetes and                 Dyslipidemia.  Sonographer:    Harvie Junior Referring Phys: [id number] VASUNDHRA RATHORE  Sonographer Comments: Technically difficult study due to poor echo windows and patient is obese. Image acquisition challenging due to patient body habitus. IMPRESSIONS  1. Left ventricular ejection fraction, by estimation, is 60 to 65%. The left ventricle has normal function. The left ventricle has no regional wall motion abnormalities. There is mild concentric left ventricular hypertrophy. Left ventricular diastolic parameters are consistent with Grade II diastolic dysfunction (pseudonormalization). Elevated left atrial pressure.  2. Right ventricular systolic function is normal. The right ventricular size is normal. There is normal pulmonary artery systolic pressure. The estimated right ventricular systolic pressure is 41.9 mmHg.  3. The mitral valve is normal in structure. Mild mitral valve regurgitation. Moderate to severe mitral annular calcification.  4. The aortic valve is tricuspid. There is mild calcification of the aortic valve. There is mild thickening of the aortic valve. Aortic valve regurgitation is not visualized. Aortic  valve sclerosis/calcification is present, without any evidence of aortic stenosis.  5. The inferior vena cava is normal in size with greater than 50% respiratory variability, suggesting right atrial pressure of 3 mmHg. FINDINGS  Left Ventricle: Left ventricular ejection fraction, by estimation, is 60 to 65%. The left ventricle has normal function. The left ventricle has no regional wall motion abnormalities. The left ventricular internal cavity size was normal in size. There is  mild concentric left ventricular hypertrophy. Left ventricular diastolic parameters are consistent with Grade II diastolic dysfunction (pseudonormalization). Elevated left atrial pressure. Right Ventricle: The right ventricular size is normal. No increase in right ventricular wall thickness. Right ventricular systolic function is normal. There is normal pulmonary artery systolic pressure. The tricuspid regurgitant velocity is 2.39 m/s, and  with an assumed right atrial pressure of 3 mmHg, the estimated right ventricular systolic pressure is 37.9 mmHg. Left Atrium: Left atrial size was normal in size. Right Atrium: Right atrial size was normal in size. Pericardium: Trivial pericardial effusion is present. Mitral Valve: The mitral valve is normal in structure. Moderate to severe mitral annular calcification. Mild mitral valve regurgitation. Tricuspid Valve: The tricuspid valve is normal in structure. Tricuspid valve regurgitation is trivial. Aortic Valve: The aortic valve is tricuspid. There is mild calcification of the aortic valve. There is mild thickening of the aortic valve. Aortic  valve regurgitation is not visualized. Aortic valve sclerosis/calcification is present, without any evidence of aortic stenosis. Aortic valve mean gradient measures 4.0 mmHg. Aortic valve peak gradient measures 7.7 mmHg. Aortic valve area, by VTI measures 2.47 cm. Pulmonic Valve: The pulmonic valve was normal in structure. Pulmonic valve regurgitation is  trivial. Aorta: The aortic root is normal in size and structure. Venous: The inferior vena cava is normal in size with greater than 50% respiratory variability, suggesting right atrial pressure of 3 mmHg. IAS/Shunts: No atrial level shunt detected by color flow Doppler.  LEFT VENTRICLE PLAX 2D LVIDd:         3.90 cm     Diastology LVIDs:         2.50 cm     LV e' medial:    6.68 cm/s LV PW:         1.20 cm     LV E/e' medial:  19.0 LV IVS:        1.20 cm     LV e' lateral:   7.77 cm/s LVOT diam:     1.80 cm     LV E/e' lateral: 16.3 LV SV:         79 LV SV Index:   49 LVOT Area:     2.54 cm  LV Volumes (MOD) LV vol d, MOD A2C: 69.5 ml LV vol d, MOD A4C: 79.3 ml LV vol s, MOD A2C: 20.5 ml LV vol s, MOD A4C: 34.5 ml LV SV MOD A2C:     49.0 ml LV SV MOD A4C:     79.3 ml LV SV MOD BP:      50.3 ml RIGHT VENTRICLE RV Basal diam:  3.10 cm RV Mid diam:    2.70 cm RV S prime:     7.70 cm/s TAPSE (M-mode): 1.6 cm LEFT ATRIUM             Index        RIGHT ATRIUM           Index LA diam:        3.00 cm 1.88 cm/m   RA Area:     12.60 cm LA Vol (A2C):   33.2 ml 20.78 ml/m  RA Volume:   27.20 ml  17.03 ml/m LA Vol (A4C):   44.4 ml 27.79 ml/m LA Biplane Vol: 39.8 ml 24.91 ml/m  AORTIC VALVE                     PULMONIC VALVE AV Area (Vmax):    2.22 cm      PV Vmax:          0.98 m/s AV Area (Vmean):   2.24 cm      PV Peak grad:     3.8 mmHg AV Area (VTI):     2.47 cm      PR End Diast Vel: 4.84 msec AV Vmax:           139.00 cm/s AV Vmean:          100.000 cm/s AV VTI:            0.320 m AV Peak Grad:      7.7 mmHg AV Mean Grad:      4.0 mmHg LVOT Vmax:         121.00 cm/s LVOT Vmean:        88.200 cm/s LVOT VTI:          0.310 m LVOT/AV VTI  ratio: 0.97  AORTA Ao Root diam: 2.90 cm Ao Asc diam:  3.20 cm MITRAL VALVE                TRICUSPID VALVE MV Area (PHT): 2.55 cm     TR Peak grad:   22.8 mmHg MV Decel Time: 298 msec     TR Vmax:        239.00 cm/s MR Peak grad: 67.6 mmHg MR Vmax:      411.00 cm/s   SHUNTS MV E  velocity: 127.00 cm/s  Systemic VTI:  0.31 m MV A velocity: 135.00 cm/s  Systemic Diam: 1.80 cm MV E/A ratio:  0.94 Mihai Croitoru MD Electronically signed by Sanda Klein MD Signature Date/Time: 10/13/2021/2:54:11 PM    Final    DG Chest Port 1 View  Result Date: 10/13/2021 CLINICAL DATA:  Chest pain and recent pneumonia EXAM: PORTABLE CHEST 1 VIEW COMPARISON:  09/29/2021 FINDINGS: The heart size and mediastinal contours are within normal limits. Both lungs are clear. The visualized skeletal structures are unremarkable. Remote median sternotomy IMPRESSION: No active disease. Electronically Signed   By: Ulyses Jarred M.D.   On: 10/13/2021 00:57   DG Chest 2 View  Result Date: 09/30/2021 CLINICAL DATA:  Acute cough. EXAM: CHEST - 2 VIEW COMPARISON:  February 03, 2021 FINDINGS: The heart size and mediastinal contours are stable. Mild opacity of medial left lung base is noted. There is probable mild atelectasis of medial right lung base. There is no pleural effusion or pulmonary edema. The visualized skeletal structures are stable. IMPRESSION: Mild opacity of medial left lung base, developing pneumonia is not excluded. Electronically Signed   By: Abelardo Diesel M.D.   On: 09/30/2021 11:53     TODAY-DAY OF DISCHARGE:  Subjective:   Dana Jackson today has no headache,no chest abdominal pain,no new weakness tingling or numbness, feels much better wants to go home today.   Objective:   Blood pressure (!) 108/46, pulse 89, temperature 98 F (36.7 C), temperature source Oral, resp. rate 16, height 5' (1.524 m), weight 59.7 kg, SpO2 99 %.  Intake/Output Summary (Last 24 hours) at 10/23/2021 0927 Last data filed at 10/22/2021 1243 Gross per 24 hour  Intake --  Output 1500 ml  Net -1500 ml   Filed Weights   10/21/21 0500 10/22/21 0824 10/22/21 1243  Weight: 61.5 kg 61.5 kg 59.7 kg    Exam: Awake Alert, Oriented *3, No new F.N deficits, Normal affect [place].AT,PERRAL Supple Neck,No JVD, No  cervical lymphadenopathy appriciated.  Symmetrical Chest wall movement, Good air movement bilaterally, CTAB RRR,No Gallops,Rubs or new Murmurs, No Parasternal Heave +ve B.Sounds, Abd Soft, Non tender, No organomegaly appriciated, No rebound -guarding or rigidity. No Cyanosis, Clubbing or edema, No new Rash or bruise   PERTINENT RADIOLOGIC STUDIES: DG CHEST PORT 1 VIEW  Result Date: 10/23/2021 CLINICAL DATA:  Pulmonary edema, shortness of breath EXAM: PORTABLE CHEST 1 VIEW COMPARISON:  Portable exam 0539 hours compared to 10/19/2021 FINDINGS: Upper normal heart size post CABG. Mediastinal contours and pulmonary vascularity normal. Atherosclerotic calcification aorta. Mild bibasilar atelectasis. Lungs otherwise clear. Question underlying emphysematous changes. Trace LEFT pleural effusion. No pneumothorax. Prior cervical spine fusion. IMPRESSION: Post CABG. Bibasilar atelectasis and trace LEFT pleural effusion. Aortic Atherosclerosis (ICD10-I70.0). Electronically Signed   By: Lavonia Dana M.D.   On: 10/23/2021 08:12   DG Swallowing Func-Speech Pathology  Result Date: 10/22/2021 Table formatting from the original result was not included. Images from the original result were not  included. 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  95% ost cX, 80-90% PDA --> CABG X3; b) Post CABG CATH for + Myoview with basal anterior ischemia -> Ost LAD 80% (diffuse) then 100%  after SP2, RI 70% (too small for PCI), Ost-prox Cx 99% & OM1 90%, OstrPDA 80% (small); Occluded SVG-rPDA.  Patent LIMA-dLAD, SVG-OM: culprit ~ p-m LAD pre-LIMA & RI - not good PCI target --> Med Rx  CKD (chronic kidney disease) stage 3, GFR 30-59 ml/min (HCC)   Degenerative disc disease, cervical   Degenerative disc disease, cervical [722.4]  Diabetes mellitus without complication (HCC)   Type II  Dyspnea   Endometrial cancer ([place]) 11/2013  Treated with TAH with pelvic lymphadenectomy followed by radiation and chemotherapy  GERD (gastroesophageal reflux disease)   Hearing loss   bilateral - no hearing aids  Heart murmur   History of asthma    childhood  History of blood transfusion   History of pneumonia   "2-3 times"  History of unstable angina November/December 2012  T wave inversions in inferolateral leads.  No stress test performed.  Hyperlipidemia LDL goal <70   Hypertension, essential, benign   Neuropathy   hands  Pneumonia 2018  Renal cell carcinoma ([place]) 11/18/2008  T2aNx s/p partial left nephrectomy  S/P CABG x 3 12/2010  LIMA-LAD, SVG RPL, SVG-Circumflex  Stroke ([place])   TIA- no residual   TIA (transient ischemic attack) 1992 &  2010 Past Surgical History: Past Surgical History: Procedure Laterality Date  ANTERIOR CERVICAL DECOMP/DISCECTOMY FUSION  10/11/2005  multi-level  AV FISTULA PLACEMENT Left 08/03/2017  Procedure: ARTERIOVENOUS (AV) FISTULA CREATION LEFT ARM;  Surgeon: Rosetta Posner, MD;  Location: [facility];  Service: Vascular;  Laterality: Left;  BRONCHIAL BIOPSY  12/30/2020  Procedure: BRONCHIAL BIOPSIES;  Surgeon: Garner Nash, DO;  Location: [facility];  Service: Pulmonary;;  BRONCHIAL BIOPSY  02/03/2021  Procedure: BRONCHIAL BIOPSIES;  Surgeon: Garner Nash, DO;  Location: [facility];  Service: Pulmonary;;  BRONCHIAL BRUSHINGS  12/30/2020  Procedure: BRONCHIAL BRUSHINGS;  Surgeon: Garner Nash, DO;  Location: [facility];  Service: Pulmonary;;  BRONCHIAL BRUSHINGS  02/03/2021   Procedure: BRONCHIAL BRUSHINGS;  Surgeon: Garner Nash, DO;  Location: [facility];  Service: Pulmonary;;  BRONCHIAL NEEDLE ASPIRATION BIOPSY  12/30/2020  Procedure: BRONCHIAL NEEDLE ASPIRATION BIOPSIES;  Surgeon: Garner Nash, DO;  Location: [place];  Service: Pulmonary;;  BRONCHIAL NEEDLE ASPIRATION BIOPSY  02/03/2021  Procedure: BRONCHIAL NEEDLE ASPIRATION BIOPSIES;  Surgeon: Garner Nash, DO;  Location: [facility];  Service: Pulmonary;;  CARDIAC CATHETERIZATION  12/24/10  40% left main, 80% mid LAD, 95% ostial circumflex, 80-90% PDA.  CARDIAC CATHETERIZATION N/A 07/01/2014  Procedure: Left Heart Cath and Cors/Grafts Angiography;  Surgeon: Leonie Man, MD;  Location: [facility]:  For Abn Nuc @ [facility]: Ost LAD 80%, mid LAD 100% after S/P 2, 70% RI (no PCI target), ost-prox Cx 99%, OM1 90%. Ost rPDA  80% (~ pre-CABG), occluded SVG-rPDA, patent LIMA-dLAD, SVG OM; potential culprit for abn Nuc scan = pLAD Dz, small RI 70% or PDA -> med Rx  CAROTID ENDARTERECTOMY Left   CHOLECYSTECTOMY  ~ [place] GRAFT  12/25/2010  Procedure: CORONARY ARTERY BYPASS GRAFTING (CABGX3 - LIMA-LAD, SVG RPL, SVG-Circumflex);  Surgeon: Rexene Alberts, MD;  Location: [place] [facility];  Service: Open Heart Surgery;  Laterality: N/A;  DILATATION & CURETTAGE/HYSTEROSCOPY WITH MYOSURE N/A 11/02/2013  Procedure: DILATATION & CURETTAGE/HYSTEROSCOPY WITH MYOSURE ABLATION;  Surgeon: Allena Katz, MD;  Location: [facility];  Service: Gynecology;  Laterality: N/A;  DOPPLER ECHOCARDIOGRAPHY  12/08/2010  EF =>55%,MILD CONCENTRIC LEFT VENTRICULAR HYPERTROPHY  EYE SURGERY Bilateral   Cataract  IR DIALY SHUNT INTRO NEEDLE/INTRACATH INITIAL W/IMG LEFT Left 04/24/2019  IR FLUORO GUIDE CV LINE LEFT  04/26/2019  IR US GUIDE VASC ACCESS LEFT  04/24/2019  IR US GUIDE VASC ACCESS LEFT  04/26/2019  LEFT HEART CATH AND CORS/GRAFTS ANGIOGRAPHY N/A 10/13/2021  Procedure: LEFT HEART CATH AND CORS/GRAFTS ANGIOGRAPHY;   Surgeon: Belva Crome, MD;  Location: [facility];  Service: Cardiovascular;  Laterality: N/A;  LEFT HEART CATHETERIZATION WITH CORONARY ANGIOGRAM N/A 12/24/2010  Procedure: LEFT HEART CATHETERIZATION WITH CORONARY ANGIOGRAM;  Surgeon: Leonie Man, MD;  Location: [facility] C[facility];  Service: Cardiovascular;  Laterality: N/A;  NM MYOCAR SINGLE W/SPECT  07/26/2007  EF 79%, LEFT VENT.FUNCTION NORMAL  PARTIAL NEPHRECTOMY Left 11/18/2008  left partial nephrectomy for renal cell CA  PET Myocardial Perfusion Scan  06/13/2014  At [facility]: Moderate size, mild severity completely reversible defect involving the basal anterior, mid anterior and apical anterior segments consistent with ischemia. EF 65% with normal global function.  TONSILLECTOMY    "in college"  TOTAL ABDOMINAL HYSTERECTOMY  November 2015   At [facility]: Robotic procedure with pelvic lymphadenectomy  TRANSTHORACIC ECHOCARDIOGRAM  06/13/2014  At [place]: EF 60-65%. Grade 1 diastolic dysfunction. Mild MR. Aortic sclerosis. Moderate pulmonary hypertension  TUBAL LIGATION  ~ 1984  UPPER GI ENDOSCOPY    VIDEO BRONCHOSCOPY WITH RADIAL ENDOBRONCHIAL ULTRASOUND  12/30/2020  Procedure: RADIAL ENDOBRONCHIAL ULTRASOUND;  Surgeon: Garner Nash, DO;  Location: [place];  Service: Pulmonary;;  VIDEO BRONCHOSCOPY WITH RADIAL ENDOBRONCHIAL ULTRASOUND  02/03/2021  Procedure: RADIAL ENDOBRONCHIAL ULTRASOUND;  Surgeon: Garner Nash, DO;  Location: [facility];  Service: Pulmonary;;  WISDOM TOOTH EXTRACTION   HPI: Patient is a 76 y.o. 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 She was admitted on 10/19/21 to ICU after presenting to the ED with c/o several weeks cough and developing fever and emesis. Patient found to be hypoxic with sats of 58% on RA and was placed on NRB.   Subjective: pleasant, cooperative  Recommendations for follow up therapy are one component of a multi-disciplinary discharge planning process, led by the attending physician.  Recommendations may be updated based on patient status, additional functional criteria and insurance authorization. Assessment / Plan / Recommendation   10/22/2021   2:27 PM Clinical Impressions Clinical Impression Patient presents with a mild pharyngeal phase dysphagia but with oral and cervical-esophageal phases of swallow appearing WFL. During pharyngeal phase, patient exhibited instances of flash penetration above vocal cords with thin liquids (no significant difference with cup sips versus straw sips) with full clearance of penetrate from  vestibule. No aspiration observed at any phase of the swallow and no pharyngeal residuals observed post swallows. 13 mm barium tablet transited pharyngeally and through cervical esophagus without difficulty. Esophageal sweep did not reveal any significant dysmotility or stasis of barium. Patient appears safe to continue on regular texture solids, thin liquids and aspiration risk is mild. SLP Visit Diagnosis Dysphagia, pharyngeal phase (R13.13) Impact on safety and function No limitations;Mild aspiration risk     10/22/2021   2:27 PM Treatment Recommendations Treatment Recommendations No treatment recommended at this time     10/20/2021  12:42 PM Prognosis Prognosis for Safe Diet Advancement Good   10/22/2021   2:27 PM Diet Recommendations SLP Diet Recommendations Regular solids;Thin liquid Liquid Administration via Cup;Straw Medication Administration Whole meds with liquid Compensations Slow rate;Small sips/bites Postural Changes Seated upright at 90 degrees     10/22/2021   2:27 PM Other Recommendations Follow Up Recommendations No SLP follow up Assistance recommended at discharge PRN Functional Status Assessment Patient has had a recent decline in their functional status and demonstrates the ability to  make significant improvements in function in a reasonable and predictable amount of time.    No data to display        10/22/2021   2:26 PM Oral Phase Oral Phase [facility]    10/22/2021   2:26 PM Pharyngeal Phase Pharyngeal Phase Impaired Pharyngeal- Thin Cup Reduced epiglottic inversion Pharyngeal- Thin Straw Reduced epiglottic inversion;Penetration/Aspiration during swallow Pharyngeal Material enters airway, remains ABOVE vocal cords then ejected out Pharyngeal- Puree WFL Pharyngeal- Regular WFL Pharyngeal- Pill [facility]    10/22/2021   2:27 PM Cervical Esophageal Phase  Cervical Esophageal Phase Dana Jackson Sonia Baller, MA, CCC-SLP Speech Therapy                       PERTINENT LAB RESULTS: CBC: Recent Labs    10/22/21 0243  WBC 5.5  HGB 9.0*  HCT 25.7*  PLT 127*   CMET CMP     Component Value Date/Time   NA 134 (L) 10/22/2021 0243   NA 141 12/31/2019 0920   K 4.1 10/22/2021 0243   CL 93 (L) 10/22/2021 0243   CO2 28 10/22/2021 0243   GLUCOSE 154 (H) 10/22/2021 0243   BUN 31 (H) 10/22/2021 0243   BUN 30 (H) 12/31/2019 0920   CREATININE 4.91 (H) 10/22/2021 0243   CALCIUM 9.1 10/22/2021 0243   PROT 5.1 (L) 10/22/2021 0243   PROT 5.5 (L) 12/31/2019 0920   ALBUMIN 2.2 (L) 10/22/2021 0243   ALBUMIN 3.9 12/31/2019 0920   AST 38 10/22/2021 0243   ALT 33 10/22/2021 0243   ALKPHOS 72 10/22/2021 0243   BILITOT 0.7 10/22/2021 0243   BILITOT 0.4 12/31/2019 0920   GFRNONAA 9 (L) 10/22/2021 0243   GFRAA 12 (L) 12/31/2019 0920    GFR Estimated Creatinine Clearance: 7.9 mL/min (A) (by C-G formula based on SCr of 4.91 mg/dL (H)). No results for input(s): "LIPASE", "AMYLASE" in the last 72 hours. No results for input(s): "CKTOTAL", "CKMB", "CKMBINDEX", "TROPONINI" in the last 72 hours. Invalid input(s): "POCBNP" No results for input(s): "DDIMER" in the last 72 hours. No results for input(s): "HGBA1C" in the last 72 hours. No results for input(s): "CHOL", "HDL", "LDLCALC", "TRIG", "CHOLHDL",  "LDLDIRECT" in the last 72 hours. No results for input(s): "TSH", "T4TOTAL", "T3FREE", "THYROIDAB" in the last 72 hours.  Invalid input(s): "FREET3" No results for input(s): "VITAMINB12", "FOLATE", "FERRITIN", "TIBC", "IRON", "RETICCTPCT" in the last 72 hours.  Coags: No results for input(s): "INR" in the last 72 hours.  Invalid input(s): "PT" Microbiology: Recent Results (from the past 240 hour(s))  Blood Culture (routine x 2)     Status: None (Preliminary result)   Collection Time: 10/19/21  6:12 AM   Specimen: BLOOD RIGHT HAND  Result Value Ref Range Status   Specimen Description BLOOD RIGHT HAND  Final   Special Requests   Final    BOTTLES DRAWN AEROBIC AND ANAEROBIC Blood Culture adequate volume   Culture   Final    NO GROWTH 3 DAYS Performed at [facility], [place] [address] [place], [place] [address]    Report Status PENDING  Incomplete  Urine Culture     Status: Abnormal   Collection Time: 10/19/21  6:13 AM   Specimen: In/Out Cath Urine  Result Value Ref Range Status   Specimen Description IN/OUT CATH URINE  Final   Special Requests   Final    NONE Performed at [facility], [place] [address] [place], [place] [address]    Culture (A)  Final    >=100,000 COLONIES/mL ESCHERICHIA COLI >=100,000 COLONIES/mL KLEBSIELLA PNEUMONIAE    Report Status 10/22/2021 FINAL  Final   Organism ID, Bacteria ESCHERICHIA COLI (A)  Final   Organism ID, Bacteria KLEBSIELLA PNEUMONIAE (A)  Final      Susceptibility   Escherichia coli - MIC*    AMPICILLIN >=32 RESISTANT Resistant     CEFAZOLIN >=64 RESISTANT Resistant     CEFEPIME <=0.12 SENSITIVE Sensitive     CEFTRIAXONE <=0.25 SENSITIVE Sensitive     CIPROFLOXACIN <=0.25 SENSITIVE Sensitive     GENTAMICIN <=1 SENSITIVE Sensitive     IMIPENEM <=0.25 SENSITIVE Sensitive     NITROFURANTOIN <=16 SENSITIVE Sensitive     TRIMETH/SULFA <=20 SENSITIVE Sensitive     AMPICILLIN/SULBACTAM >=32 RESISTANT Resistant     PIP/TAZO 8  SENSITIVE Sensitive     * >=100,000 COLONIES/mL ESCHERICHIA COLI   Klebsiella pneumoniae - MIC*    AMPICILLIN >=32 RESISTANT Resistant     CEFAZOLIN <=4 SENSITIVE Sensitive     CEFEPIME <=0.12 SENSITIVE Sensitive     CEFTRIAXONE <=0.25 SENSITIVE Sensitive     CIPROFLOXACIN <=0.25 SENSITIVE Sensitive     GENTAMICIN <=1 SENSITIVE Sensitive     IMIPENEM 1 SENSITIVE Sensitive     NITROFURANTOIN 32 SENSITIVE Sensitive     TRIMETH/SULFA <=20 SENSITIVE Sensitive     AMPICILLIN/SULBACTAM >=32 RESISTANT Resistant     PIP/TAZO 8 SENSITIVE Sensitive     * >=100,000 COLONIES/mL KLEBSIELLA PNEUMONIAE  SARS Coronavirus 2 by RT PCR (hospital order, performed in [place] hospital lab) *cepheid single result test* Anterior Nasal Swab     Status: None   Collection Time: 10/19/21  6:13 AM   Specimen: Anterior Nasal Swab  Result Value Ref Range Status   SARS Coronavirus 2 by RT PCR NEGATIVE NEGATIVE Final    Comment: (NOTE) SARS-CoV-2 target nucleic acids are NOT DETECTED.  The SARS-CoV-2 RNA is generally detectable in upper and lower respiratory specimens during the acute phase of infection. The lowest concentration of SARS-CoV-2 viral copies this assay can detect is 250 copies / mL. A negative result does not preclude SARS-CoV-2 infection and should not be used as the sole basis for treatment or other patient management decisions.  A negative result may occur with improper specimen collection / handling, submission of specimen other than nasopharyngeal swab, presence of viral mutation(s) within the areas targeted by this assay, and  inadequate number of viral copies (<250 copies / mL). A negative result must be combined with clinical observations, patient history, and epidemiological information.  Fact Sheet for Patients:   https://www.patel.info/  Fact Sheet for Healthcare Providers: https://hall.com/  This test is not yet approved or  cleared by  the [place] FDA and has been authorized for detection and/or diagnosis of SARS-CoV-2 by FDA under an Emergency Use Authorization (EUA).  This EUA will remain in effect (meaning this test can be used) for the duration of the COVID-19 declaration under Section 564(b)(1) of the Act, 21 U.S.C. section 360bbb-3(b)(1), unless the authorization is terminated or revoked sooner.  Performed at [facility], [place] [address], [place], [place] [address]   Blood Culture (routine x 2)     Status: None (Preliminary result)   Collection Time: 10/19/21  6:17 AM   Specimen: BLOOD  Result Value Ref Range Status   Specimen Description BLOOD SITE NOT SPECIFIED  Final   Special Requests   Final    BOTTLES DRAWN AEROBIC AND ANAEROBIC Blood Culture results may not be optimal due to an inadequate volume of blood received in culture bottles   Culture   Final    NO GROWTH 3 DAYS Performed at [facility], [place] [address], [place], [place] [address]    Report Status PENDING  Incomplete  MRSA Next Gen by PCR, Nasal     Status: None   Collection Time: 10/19/21  2:00 PM  Result Value Ref Range Status   MRSA by PCR Next Gen NOT DETECTED NOT DETECTED Final    Comment: (NOTE) The GeneXpert MRSA Assay (FDA approved for NASAL specimens only), is one component of a comprehensive MRSA colonization surveillance program. It is not intended to diagnose MRSA infection nor to guide or monitor treatment for MRSA infections. Test performance is not FDA approved in patients less than 10 years old. Performed at [facility], [place] [address] [place], [place] [address]     FURTHER DISCHARGE INSTRUCTIONS:  Get Medicines reviewed and adjusted: Please take all your medications with you for your next visit with your Primary MD  Laboratory/radiological data: Please request your Primary MD to go over all hospital tests and procedure/radiological results at the follow up, please ask your Primary MD to get  all Hospital records sent to his/her office.  In some cases, they will be blood work, cultures and biopsy results pending at the time of your discharge. Please request that your primary care M.D. goes through all the records of your hospital data and follows up on these results.  Also Note the following: If you experience worsening of your admission symptoms, develop shortness of breath, life threatening emergency, suicidal or homicidal thoughts you must seek medical attention immediately by calling 911 or calling your MD immediately  if symptoms less severe.  You must read complete instructions/literature along with all the possible adverse reactions/side effects for all the Medicines you take and that have been prescribed to you. Take any new Medicines after you have completely understood and accpet all the possible adverse reactions/side effects.   Do not drive when taking Pain medications or sleeping medications (Benzodaizepines)  Do not take more than prescribed Pain, Sleep and Anxiety Medications. It is not advisable to combine anxiety,sleep and pain medications without talking with your primary care practitioner  Special Instructions: If you have smoked or chewed Tobacco  in the last 2 yrs please stop smoking, stop any regular Alcohol  and or any Recreational drug use.  Wear Seat belts while driving.  Please note: You were cared for by a hospitalist during your hospital stay. Once you are discharged, your primary care physician will handle any further medical issues. Please note that NO REFILLS for any discharge medications will be authorized once you are discharged, as it is imperative that you return to your primary care physician (or establish a relationship with a primary care physician if you do not have one) for your post hospital discharge needs so that they can reassess your need for medications and monitor your lab values.  Total Time spent coordinating discharge including  counseling, education and face to face time equals greater than 30 minutes.  SignedOren Binet 10/23/2021 9:27 AM

## 2021-10-23 NOTE — Progress Notes (Signed)
Subjective: Seen in room with husband present no complaints for discharge today, tolerated dialysis yesterday on schedule  Objective Vital signs in last 24 hours: Vitals:   10/23/21 0400 10/23/21 0405 10/23/21 0600 10/23/21 1119  BP:  (!) 108/46  125/68  Pulse: 90 91 89 99  Resp: '14 15 16 20  '$ Temp:  98 F (36.7 C)    TempSrc:  Oral    SpO2: 95% 93% 99% 95%  Weight:      Height:       Weight change:   Physical Exam: General: Alert elderly female pleasant WDWN NAD, room air O2 95% Heart: RRR no MRG Lungs: Slightly decreased breath sounds bases otherwise CTA, nonlabored breathing room air Abdomen: NABS, soft, NTND Extremities: Pedal edema Dialysis Access: LUA AV fistula + bruit  OP dialysis Orders: [facility] TTS 4h  400/1.5  62.7kg  2/2 bath  Hep 2000   LUA AVF - Hb 10.3 on 10/5 - mircera 50 ug q2 wks, not started yet - venofer '50mg'$  weekly - last HD 10/7 w/ post wt 62.1kg     Home meds include - aspirin, atorvastatin, clopidogrel, prns/ vits/ supps   Problem/Plan: Septic shock 2/2 HCAP - w/ fevers, chills , cough.  Completed IV Zosyn , transitioned to p.o. antibiotics per admit team and plans discharge today AHRF - d/t HCAP, +/- vol overload, EDW lower  at discharge ESRD - on HD TTS.  HTN - holding home meds given low BPs. Euvolemic on exam, at dry wt. CXR at admit w/ some vasc congestion vs infection, CT scan the same./challenged  UF / repeat CXR after HD =/pleural effusion, bibasilar atelectasis.  Below EDW with pneumonia, creased p.o. 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 ESA.  Was given in the hospital continue outpatient MBD ckd - CCa and PO4 at range. Not getting any vdra or sensipar.   Ernest Haber, PA-C [facility] [phone] 10/23/2021,11:24 AM  LOS: 4 days    Labs: Basic Metabolic Panel: Recent Labs  Lab 10/19/21 0617 10/19/21 0635 10/19/21 0732 10/19/21 1323 10/19/21 1334 10/20/21 0619 10/22/21 0243  NA 135   < > 135   < > 132* 130* 134*  K 3.8   < > 3.8   < > 3.8 4.1 4.1  CL 97*   < > 96*  --   --  97* 93*  CO2 28  --   --   --   --  21* 28  GLUCOSE 114*   < > 107*  --   --  71 154*  BUN 33*   < > 34*  --   --  41* 31*  CREATININE 4.77*   < > 4.90*  --   --  5.39* 4.91*  CALCIUM 8.4*  --   --   --   --  8.3* 9.1  PHOS  --   --   --   --   --  3.9  --    < > = values in this interval not displayed.   Liver Function Tests: Recent Labs  Lab 10/19/21 0617 10/22/21 0243  AST 33 38  ALT 28 33  ALKPHOS 101 72  BILITOT 0.7 0.7  PROT 5.8* 5.1*  ALBUMIN 2.7* 2.2*   No results for input(s): "  LIPASE", "AMYLASE" in the last 168 hours. No results for input(s): "AMMONIA" in the last 168 hours. CBC: Recent Labs  Lab 10/19/21 0617 10/19/21 0635 10/19/21 1334 10/20/21 0619 10/22/21 0243  WBC 5.3  --   --  9.1 5.5  NEUTROABS 4.8  --   --   --   --   HGB 10.8*   < > 12.2 8.7* 9.0*  HCT 31.5*   < > 36.0 25.0* 25.7*  MCV 105.7*  --   --  105.0* 102.8*  PLT 159  --   --  130* 127*   < > = values in this interval not displayed.   Cardiac Enzymes: No results for input(s): "CKTOTAL", "CKMB", "CKMBINDEX", "TROPONINI" in the last 168 hours. CBG: Recent Labs  Lab 10/19/21 1356  GLUCAP 119*    Studies/Results:  Medications:  piperacillin-tazobactam (ZOSYN)  IV 2.25 g (10/23/21 0636)    aspirin EC  81 mg Oral Daily   atorvastatin  80 mg Oral QHS   Chlorhexidine Gluconate Cloth  6 each Topical Q0600   clopidogrel  75 mg Oral Daily   cyanocobalamin  1,000 mcg Intramuscular Q30 days   darbepoetin (ARANESP) injection - DIALYSIS  25 mcg Intravenous Q Thu-HD   gabapentin  100 mg Oral BID   pantoprazole  40 mg Oral Daily   pantoprazole  40 mg Oral Daily

## 2021-10-24 LAB — CULTURE, BLOOD (ROUTINE X 2)
Culture: NO GROWTH
Culture: NO GROWTH
Special Requests: ADEQUATE

## 2021-11-09 ENCOUNTER — Encounter: Payer: Self-pay | Admitting: Nurse Practitioner

## 2021-11-09 ENCOUNTER — Ambulatory Visit: Payer: Medicare PPO | Attending: Nurse Practitioner | Admitting: Nurse Practitioner

## 2021-11-09 VITALS — BP 134/58 | HR 71 | Ht 60.0 in | Wt 137.0 lb

## 2021-11-09 DIAGNOSIS — Z992 Dependence on renal dialysis: Secondary | ICD-10-CM

## 2021-11-09 DIAGNOSIS — E785 Hyperlipidemia, unspecified: Secondary | ICD-10-CM | POA: Diagnosis not present

## 2021-11-09 DIAGNOSIS — I10 Essential (primary) hypertension: Secondary | ICD-10-CM | POA: Diagnosis not present

## 2021-11-09 DIAGNOSIS — E1122 Type 2 diabetes mellitus with diabetic chronic kidney disease: Secondary | ICD-10-CM

## 2021-11-09 DIAGNOSIS — Z8673 Personal history of transient ischemic attack (TIA), and cerebral infarction without residual deficits: Secondary | ICD-10-CM

## 2021-11-09 DIAGNOSIS — R600 Localized edema: Secondary | ICD-10-CM | POA: Diagnosis not present

## 2021-11-09 DIAGNOSIS — N186 End stage renal disease: Secondary | ICD-10-CM

## 2021-11-09 DIAGNOSIS — N185 Chronic kidney disease, stage 5: Secondary | ICD-10-CM

## 2021-11-09 DIAGNOSIS — I25118 Atherosclerotic heart disease of native coronary artery with other forms of angina pectoris: Secondary | ICD-10-CM

## 2021-11-09 DIAGNOSIS — J189 Pneumonia, unspecified organism: Secondary | ICD-10-CM

## 2021-11-09 MED ORDER — FUROSEMIDE 20 MG PO TABS: ORAL_TABLET | ORAL | 3 refills | Status: DC

## 2021-11-09 MED ORDER — CLOPIDOGREL BISULFATE 75 MG PO TABS: 75.0000 mg | ORAL_TABLET | Freq: Every day | ORAL | 3 refills | Status: DC

## 2021-11-09 NOTE — Patient Instructions (Signed)
Medication Instructions:  Lasix 20 mg on Tuesdays and Saturdays  *If you need a refill on your cardiac medications before your next appointment, please call your pharmacy*   Lab Work: Your physician recommends that you complete labs today LFTs & Fasting Lipids  If you have labs (blood work) drawn today and your tests are completely normal, you will receive your results only by: [place] (if you have MyChart) OR A paper copy in the mail If you have any lab test that is abnormal or we need to change your treatment, we will call you to review the results.   Testing/Procedures: NONE ordered at this time of appointment     Follow-Up: At [facility], you and your health needs are our priority.  As part of our continuing mission to provide you with exceptional heart care, we have created designated Provider Care Teams.  These Care Teams include your primary Cardiologist (physician) and Advanced Practice Providers (APPs -  Physician Assistants and Nurse Practitioners) who all work together to provide you with the care you need, when you need it.  We recommend signing up for the patient portal called "MyChart".  Sign up information is provided on this After Visit Summary.  MyChart is used to connect with patients for Virtual Visits (Telemedicine).  Patients are able to view lab/test results, encounter notes, upcoming appointments, etc.  Non-urgent messages can be sent to your provider as well.   To learn more about what you can do with MyChart, go to [email or web address].    Your next appointment:   4-6 month(s)  The format for your next appointment:   In Person  Provider:   Glenetta Hew, MD  or Diona Browner, NP        Other Instructions   Important Information About Sugar

## 2021-11-09 NOTE — Progress Notes (Signed)
Office Visit    Patient Name: Dana Jackson Date of Encounter: 11/09/2021  Primary Care Provider:  Heywood Bene, PA-C Primary Cardiologist:  Glenetta Hew, MD  Chief Complaint    76 year old female with a history of CAD, hypertension, hyperlipidemia, ESRD on HD, TIA, renal cell carcinoma s/p partial left nephrectomy, lung cancer s/p radiation therapy, and type 2 diabetes who presents for hospital follow-up related to CAD s/p NSTEMI.  Past Medical History    Past Medical History:  Diagnosis Date   Anemia    Arthritis    hands   Asthma    childhood   Bacteremia due to Escherichia coli 06/2013   Currently being treated with anti-biotics   CAD in native artery 12/2010   a) Cath for exertional angina & EKG changes: 40% LM, 80% mid LAD.  95% ost cX, 80-90% PDA --> CABG X3; b) Post CABG CATH for + Myoview with basal anterior ischemia -> Ost LAD 80% (diffuse) then 100% after SP2, RI 70% (too small for PCI), Ost-prox Cx 99% & OM1 90%, OstrPDA 80% (small); Occluded SVG-rPDA.  Patent LIMA-dLAD, SVG-OM: culprit ~ p-m LAD pre-LIMA & RI - not good PCI target --> Med Rx   CKD (chronic kidney disease) stage 3, GFR 30-59 ml/min (HCC)    Degenerative disc disease, cervical    Degenerative disc disease, cervical [722.4]   Diabetes mellitus without complication (HCC)    Type II   Dyspnea    Endometrial cancer ([place]) 11/2013   Treated with TAH with pelvic lymphadenectomy followed by radiation and chemotherapy   GERD (gastroesophageal reflux disease)    Hearing loss    bilateral - no hearing aids   Heart murmur    History of asthma     childhood   History of blood transfusion    History of pneumonia    "2-3 times"   History of unstable angina November/December 2012   T wave inversions in inferolateral leads.  No stress test performed.   Hyperlipidemia LDL goal <70    Hypertension, essential, benign    Neuropathy    hands   Pneumonia 2018   Renal cell carcinoma ([place]) 11/18/2008    T2aNx s/p partial left nephrectomy   S/P CABG x 3 12/2010   LIMA-LAD, SVG RPL, SVG-Circumflex   Stroke ([place])    TIA- no residual    TIA (transient ischemic attack) 1992 &  2010   Past Surgical History:  Procedure Laterality Date   ANTERIOR CERVICAL DECOMP/DISCECTOMY FUSION  10/11/2005   multi-level   AV FISTULA PLACEMENT Left 08/03/2017   Procedure: ARTERIOVENOUS (AV) FISTULA CREATION LEFT ARM;  Surgeon: Rosetta Posner, MD;  Location: [facility];  Service: Vascular;  Laterality: Left;   BRONCHIAL BIOPSY  12/30/2020   Procedure: BRONCHIAL BIOPSIES;  Surgeon: Garner Nash, DO;  Location: [facility];  Service: Pulmonary;;   BRONCHIAL BIOPSY  02/03/2021   Procedure: BRONCHIAL BIOPSIES;  Surgeon: Garner Nash, DO;  Location: [facility];  Service: Pulmonary;;   BRONCHIAL BRUSHINGS  12/30/2020   Procedure: BRONCHIAL BRUSHINGS;  Surgeon: Garner Nash, DO;  Location: [facility];  Service: Pulmonary;;   BRONCHIAL BRUSHINGS  02/03/2021   Procedure: BRONCHIAL BRUSHINGS;  Surgeon: Garner Nash, DO;  Location: [facility];  Service: Pulmonary;;   BRONCHIAL NEEDLE ASPIRATION BIOPSY  12/30/2020   Procedure: BRONCHIAL NEEDLE ASPIRATION BIOPSIES;  Surgeon: Garner Nash, DO;  Location: [facility];  Service: Pulmonary;;   BRONCHIAL NEEDLE ASPIRATION BIOPSY  02/03/2021  Procedure: BRONCHIAL NEEDLE ASPIRATION BIOPSIES;  Surgeon: Garner Nash, DO;  Location: [facility] [place];  Service: Pulmonary;;   CARDIAC CATHETERIZATION  12/24/10   40% left main, 80% mid LAD, 95% ostial circumflex, 80-90% PDA.   CARDIAC CATHETERIZATION N/A 07/01/2014   Procedure: Left Heart Cath and Cors/Grafts Angiography;  Surgeon: Leonie Man, MD;  Location: [facility]:  For Abn Nuc @ [facility]: Ost LAD 80%, mid LAD 100% after S/P 2, 70% RI (no PCI target), ost-prox Cx 99%, OM1 90%. Ost rPDA 80% (~ pre-CABG), occluded SVG-rPDA, patent LIMA-dLAD, SVG OM; potential culprit for abn Nuc scan = pLAD Dz, small RI 70%  or PDA -> med Rx   CAROTID ENDARTERECTOMY Left    CHOLECYSTECTOMY  ~ [place] GRAFT  12/25/2010   Procedure: CORONARY ARTERY BYPASS GRAFTING (CABGX3 - LIMA-LAD, SVG RPL, SVG-Circumflex);  Surgeon: Rexene Alberts, MD;  Location: [facility];  Service: Open Heart Surgery;  Laterality: N/A;   DILATATION & CURETTAGE/HYSTEROSCOPY WITH MYOSURE N/A 11/02/2013   Procedure: DILATATION & CURETTAGE/HYSTEROSCOPY WITH MYOSURE ABLATION;  Surgeon: Allena Katz, MD;  Location: [facility];  Service: Gynecology;  Laterality: N/A;   DOPPLER ECHOCARDIOGRAPHY  12/08/2010   EF =>55%,MILD CONCENTRIC LEFT VENTRICULAR HYPERTROPHY   EYE SURGERY Bilateral    Cataract   IR DIALY SHUNT INTRO NEEDLE/INTRACATH INITIAL W/IMG LEFT Left 04/24/2019   IR FLUORO GUIDE CV LINE LEFT  04/26/2019   IR US GUIDE VASC ACCESS LEFT  04/24/2019   IR US GUIDE VASC ACCESS LEFT  04/26/2019   LEFT HEART CATH AND CORS/GRAFTS ANGIOGRAPHY N/A 10/13/2021   Procedure: LEFT HEART CATH AND CORS/GRAFTS ANGIOGRAPHY;  Surgeon: Belva Crome, MD;  Location: [facility];  Service: Cardiovascular;  Laterality: N/A;   LEFT HEART CATHETERIZATION WITH CORONARY ANGIOGRAM N/A 12/24/2010   Procedure: LEFT HEART CATHETERIZATION WITH CORONARY ANGIOGRAM;  Surgeon: Leonie Man, MD;  Location: [facility];  Service: Cardiovascular;  Laterality: N/A;   NM MYOCAR SINGLE W/SPECT  07/26/2007   EF 79%, LEFT VENT.FUNCTION NORMAL   PARTIAL NEPHRECTOMY Left 11/18/2008   left partial nephrectomy for renal cell CA   PET Myocardial Perfusion Scan  06/13/2014   At [facility]: Moderate size, mild severity completely reversible defect involving the basal anterior, mid anterior and apical anterior segments consistent with ischemia. EF 65% with normal global function.   TONSILLECTOMY     "in college"   TOTAL ABDOMINAL HYSTERECTOMY  November 2015    At [facility]: Robotic procedure with pelvic lymphadenectomy   TRANSTHORACIC  ECHOCARDIOGRAM  06/13/2014   At [facility]: EF 60-65%. Grade 1 diastolic dysfunction. Mild MR. Aortic sclerosis. Moderate pulmonary hypertension   TUBAL LIGATION  ~ 1984   UPPER GI ENDOSCOPY     VIDEO BRONCHOSCOPY WITH RADIAL ENDOBRONCHIAL ULTRASOUND  12/30/2020   Procedure: RADIAL ENDOBRONCHIAL ULTRASOUND;  Surgeon: Garner Nash, DO;  Location: [place];  Service: Pulmonary;;   VIDEO BRONCHOSCOPY WITH RADIAL ENDOBRONCHIAL ULTRASOUND  02/03/2021   Procedure: RADIAL ENDOBRONCHIAL ULTRASOUND;  Surgeon: Garner Nash, DO;  Location: [facility];  Service: Pulmonary;;   WISDOM TOOTH EXTRACTION      Allergies  Allergies  Allergen Reactions   Sulfa Antibiotics Other (See Comments)    "AKI"    Erythromycin Itching   Pravastatin Other (See Comments)    Unknown reaction   Simvastatin Other (See Comments)    Reaction not known   Codeine  Nausea Only and Other (See Comments)    Tolerate Hycodan   Gemfibrozil Itching    History of Present Illness    76 year old female with the above past medical history including CAD, hypertension, hyperlipidemia, ESRD on HD, TIA, renal cell carcinoma s/p partial left nephrectomy, lung cancer s/p radiation therapy, and type 2 diabetes who presents for hospital follow-up related to CAD s/p NSTEMI.  She has a history of CAD s/p CABG x3 (LIMA-LAD, SVG, RPL, SVG-Circumflex) in 2012.  She had an abnormal cardiac PET stress test in 2016.  Cardiac catheterization in June 2016 revealed occluded SVG with severe ostial PDA disease, moderate mid RCA disease with stable flow to the post anterolateral system, patent LIMA to diffusely diseased LAD and patent vein graft to OM1 with moderate disease in the AV groove circumflex lesion OM 2.  She presented to the ED on 10/13/2021 with chest pain, shortness of breath.  EKG showed ST depression, troponin was elevated at greater than 1400.  Cardiology was consulted. Echocardiogram showed EF 60 to 65%, normal LV function,  no RWMA, mild concentric LVH, G2 DD, mild mitral valve regurgitation, moderate to severe MAC. She underwent cardiac catheterization on 10/13/2021 which revealed new total occlusion of saphenous vein graft to the obtuse marginal, chronic total occlusion of saphenous vein graft to PDA, patent LIMA to the apical LAD which was totaled proximally and distal to the insertion site, 70% left main, 70% followed by 80% proximal and mid LAD, 99% ostial circumflex, 70 to 80% right coronary artery, 95% PDA and normal LV function.  Medical therapy recommended.  It was noted that if symptoms persisted, PCI of the RCA could be considered.  She was discharged home in stable condition on 10/14/2021.  Unfortunately, she returned to the ED on 10/19/2021 with fever, acute hypoxic respiratory failure in the setting of septic shock due to pneumonia.  She was admitted to the ICU and required pressors/empiric antibiotics.  MBS per speech therapy which showed no evidence of aspiration.  CT showed stable bilateral pulmonary nodules, incidental finding of 1.7 cystic lesion in the body of pancreas.  Follow-up MRI was recommended in 6 months.  She was discharged home in stable condition on 10/23/2021.  She presents today for follow-up accompanied by her husband.  Since her hospitalization she has done well from a cardiac standpoint. She denies any chest pain, dyspnea, PND, orthopnea. She does note some intermittent bilateral lower extremity edema, she states her Lasix was held during her recent hospitalization.  She is asking if this can be resumed today.  Otherwise, she reports feeling well and denies any additional concerns today.  Home Medications    Current Outpatient Medications  Medication Sig Dispense Refill   acetaminophen (TYLENOL) 500 MG tablet Take 1,000 mg by mouth every 6 (six) hours as needed for moderate pain.     Alpha-Lipoic Acid 600 MG CAPS Take 600 mg by mouth daily.     aspirin 81 MG tablet Take 81 mg by mouth at  bedtime.     atorvastatin (LIPITOR) 80 MG tablet Take 1 tablet (80 mg total) by mouth at bedtime. 90 tablet 0   benzonatate (TESSALON) 100 MG capsule Take 100-200 mg by mouth 3 (three) times daily as needed for cough.     calcitRIOL (ROCALTROL) 0.25 MCG capsule Take by mouth.     cefUROXime (CEFTIN) 250 MG tablet Take 1 tablet (250 mg total) by mouth daily. Take after dialysis on dialysis days. 2 tablet 0   cinacalcet (  SENSIPAR) 30 MG tablet Take 30 mg by mouth daily with breakfast.     cyanocobalamin (,VITAMIN B-12,) 1000 MCG/ML injection Inject 1 mL (1,000 mcg total) into the muscle every 30 (thirty) days for 90 doses. 3 mL 29   Darbepoetin Alfa (ARANESP) 60 MCG/0.3ML SOSY injection Inject 0.3 mLs (60 mcg total) into the vein every Tuesday with hemodialysis. 4.2 mL    estradiol (ESTRACE) 0.1 MG/GM vaginal cream Place 1 Applicatorful vaginally 3 (three) times a week.     famotidine (PEPCID) 20 MG tablet Take 1 tablet (20 mg total) by mouth at bedtime. 30 tablet 2   furosemide (LASIX) 20 MG tablet Take 20 mg on Tuesdays and Saturdays 30 tablet 3   gabapentin (NEURONTIN) 100 MG capsule Take 100 mg by mouth 2 (two) times daily.     iron sucrose in sodium chloride 0.9 % 100 mL Iron Sucrose (Venofer)     isosorbide mononitrate (IMDUR) 60 MG 24 hr tablet Take 1 tablet (60 mg total) by mouth at bedtime.     lidocaine-prilocaine (EMLA) cream Apply 1 application topically Every Tuesday,Thursday,and Saturday with dialysis.     loperamide (IMODIUM A-D) 2 MG tablet Take 4 mg by mouth as needed for diarrhea or loose stools.     LORATADINE PO Take 10 mg by mouth daily.     magnesium oxide (MAG-OX) 400 MG tablet Take 400 mg by mouth daily.     Methoxy PEG-Epoetin Beta (MIRCERA IJ) Mircera     metoprolol tartrate (LOPRESSOR) 50 MG tablet Take 1 tablet (50 mg total) by mouth 2 (two) times daily. schedule an appointment for further refills, 1st attempt 120 tablet 0   nitroGLYCERIN (NITROSTAT) 0.4 MG SL tablet  PLACE 1 TABLET UNDER THE TONGUE EVERY 5 MINUTES AS NEEDED FOR CHEST PAIN. (Patient taking differently: Place 0.4 mg under the tongue every 5 (five) minutes as needed for chest pain.) 25 tablet 11   Omega-3 Fatty Acids (FISH OIL) 1200 MG CAPS Take 2,400 mg by mouth in the morning and at bedtime.     omeprazole (PRILOSEC) 40 MG capsule Take 40 mg by mouth daily before breakfast.     Probiotic Product (ALIGN) 4 MG CAPS Take 4 mg by mouth daily.     promethazine-dextromethorphan (PROMETHAZINE-DM) 6.25-15 MG/5ML syrup Take 5 mLs by mouth 4 (four) times daily as needed for cough.     pyridOXINE (VITAMIN B-6) 100 MG tablet Take 200 mg by mouth daily.      sevelamer carbonate (RENVELA) 800 MG tablet Take 800 mg by mouth See admin instructions. Take 800 mg with each meal and each snack     clopidogrel (PLAVIX) 75 MG tablet Take 1 tablet (75 mg total) by mouth daily. 90 tablet 3   No current facility-administered medications for this visit.     Review of Systems    She denies chest pain, palpitations, dyspnea, pnd, orthopnea, n, v, dizziness, syncope, weight gain, or early satiety. All other systems reviewed and are otherwise negative except as noted above.   Physical Exam    VS:  BP (!) 134/58 (BP Location: Right Arm, Patient Position: Sitting, Cuff Size: Normal)   Pulse 71   Ht 5' (1.524 m)   Wt 137 lb (62.1 kg)   BMI 26.76 kg/m   GEN: Well nourished, well developed, in no acute distress. HEENT: normal. Neck: Supple, no JVD, carotid bruits, or masses. Cardiac: RRR, no murmurs, rubs, or gallops. No clubbing, cyanosis, edema.  Radials/DP/PT 2+ and equal bilaterally.  Right femoral cath site without bruising, bleeding, or hematoma. Respiratory:  Respirations regular and unlabored, clear to auscultation bilaterally. GI: Soft, nontender, nondistended, BS + x 4. MS: no deformity or atrophy. Skin: warm and dry, no rash. Neuro:  Strength and sensation are intact. Psych: Normal affect.  Accessory  Clinical Findings    ECG personally reviewed by me today -NSR, 71 bpm- no acute changes.   Lab Results  Component Value Date   WBC 5.5 10/22/2021   HGB 9.0 (L) 10/22/2021   HCT 25.7 (L) 10/22/2021   MCV 102.8 (H) 10/22/2021   PLT 127 (L) 10/22/2021   Lab Results  Component Value Date   CREATININE 4.91 (H) 10/22/2021   BUN 31 (H) 10/22/2021   NA 134 (L) 10/22/2021   K 4.1 10/22/2021   CL 93 (L) 10/22/2021   CO2 28 10/22/2021   Lab Results  Component Value Date   ALT 33 10/22/2021   AST 38 10/22/2021   ALKPHOS 72 10/22/2021   BILITOT 0.7 10/22/2021   Lab Results  Component Value Date   CHOL 129 12/31/2019   HDL 37 (L) 12/31/2019   LDLCALC 68 12/31/2019   TRIG 138 12/31/2019   CHOLHDL 3.5 12/31/2019    Lab Results  Component Value Date   HGBA1C 5.1 10/13/2021    Assessment & Plan    1. CAD: S/p CABG x3 (LIMA-LAD, SVG, RPL, SVG-Circumflex) in 2012. Most recent cath in the setting of NSTEMI on 10/13/2021 revealed new total occlusion of saphenous vein graft to the obtuse marginal, chronic total occlusion of saphenous vein graft to PDA, patent LIMA to the apical LAD which was totaled proximally and distal to the insertion site, 70% left main, 70% followed by 80% proximal and mid LAD, 99% ostial circumflex, 70 to 80% right coronary artery, 95% PDA and normal LV function. Medical therapy recommended. It was noted that if symptoms persisted, PCI of the RCA could be considered. Stable with no anginal symptoms. Continue aspirin, Plavix, Lipitor.   2. Bilateral lower extremity edema: Lasix was held during recent hospitalization.  She has noted some intermittent lower extremity edema since this time.  BP stable, can resume Lasix 20 mg on Tuesday and Saturday evenings.  Recommend she confirm this with nephrology.  3. Hypertension: BP well controlled. Continue current antihypertensive regimen.   4. Hyperlipidemia: LDL was 151 in 05/2021. Monitored and managed per PCP.  Will repeat  lipids, LFTs.  Continue aspirin, Plavix, Lipitor.     5. ESRD: On HD TTS. Follows with nephrology.   6. History of TIA: No recurrence. Continue aspirin, Plavix, Lipitor.  7. Type 2 diabetes: A1c was 5.1 in 10/2021.  Monitored and managed per PCP.    8. Pneumonia: Recently admitted to the ICU with septic shock in the setting of pneumonia.  MBS no concern for aspiration.  Appears to be resolved.  9. Disposition: Follow-up in 4-6 months.      Lenna Sciara, NP 11/09/2021, 9:51 AM

## 2021-11-10 ENCOUNTER — Other Ambulatory Visit: Payer: Self-pay

## 2021-11-10 ENCOUNTER — Telehealth: Payer: Self-pay

## 2021-11-10 DIAGNOSIS — Z79899 Other long term (current) drug therapy: Secondary | ICD-10-CM

## 2021-11-10 DIAGNOSIS — I10 Essential (primary) hypertension: Secondary | ICD-10-CM

## 2021-11-10 DIAGNOSIS — E785 Hyperlipidemia, unspecified: Secondary | ICD-10-CM

## 2021-11-10 DIAGNOSIS — I25708 Atherosclerosis of coronary artery bypass graft(s), unspecified, with other forms of angina pectoris: Secondary | ICD-10-CM

## 2021-11-10 LAB — LIPID PANEL
Chol/HDL Ratio: 4.7 ratio — ABNORMAL HIGH (ref 0.0–4.4)
Cholesterol, Total: 170 mg/dL (ref 100–199)
HDL: 36 mg/dL — ABNORMAL LOW (ref >39)
LDL Chol Calc (NIH): 102 mg/dL — ABNORMAL HIGH (ref 0–99)
Triglycerides: 182 mg/dL — ABNORMAL HIGH (ref 0–149)
VLDL Cholesterol Cal: 32 mg/dL (ref 5–40)

## 2021-11-10 LAB — HEPATIC FUNCTION PANEL
ALT: 18 IU/L (ref 0–32)
AST: 20 IU/L (ref 0–40)
Albumin: 3.9 g/dL (ref 3.8–4.8)
Alkaline Phosphatase: 154 IU/L — ABNORMAL HIGH (ref 44–121)
Bilirubin Total: 0.5 mg/dL (ref 0.0–1.2)
Bilirubin, Direct: 0.18 mg/dL (ref 0.00–0.40)
Total Protein: 5.9 g/dL — ABNORMAL LOW (ref 6.0–8.5)

## 2021-11-10 MED ORDER — EZETIMIBE 10 MG PO TABS: 10.0000 mg | ORAL_TABLET | Freq: Every day | ORAL | 1 refills | Status: DC

## 2021-11-10 NOTE — Telephone Encounter (Signed)
Spoke with pt. Pt was notified of lab results and recommendations. Pt will start Zetia 10 mg daily as directed and repeat fasting lipid panel, lfts in 6-8 weeks as directed.

## 2021-11-11 ENCOUNTER — Encounter: Payer: Self-pay | Admitting: Pulmonary Disease

## 2021-11-11 ENCOUNTER — Ambulatory Visit: Payer: Medicare PPO | Admitting: Pulmonary Disease

## 2021-11-11 ENCOUNTER — Ambulatory Visit (INDEPENDENT_AMBULATORY_CARE_PROVIDER_SITE_OTHER): Payer: Medicare PPO

## 2021-11-11 VITALS — BP 110/60 | HR 68 | Ht 60.0 in | Wt 138.4 lb

## 2021-11-11 DIAGNOSIS — D3A090 Benign carcinoid tumor of the bronchus and lung: Secondary | ICD-10-CM | POA: Diagnosis not present

## 2021-11-11 DIAGNOSIS — Z85528 Personal history of other malignant neoplasm of kidney: Secondary | ICD-10-CM

## 2021-11-11 DIAGNOSIS — J189 Pneumonia, unspecified organism: Secondary | ICD-10-CM | POA: Diagnosis not present

## 2021-11-11 DIAGNOSIS — R918 Other nonspecific abnormal finding of lung field: Secondary | ICD-10-CM

## 2021-11-11 DIAGNOSIS — Z789 Other specified health status: Secondary | ICD-10-CM

## 2021-11-11 DIAGNOSIS — Z8542 Personal history of malignant neoplasm of other parts of uterus: Secondary | ICD-10-CM

## 2021-11-11 NOTE — Patient Instructions (Signed)
Thank you for visiting Dr. Valeta Harms at [facility]. Today we recommend the following:  Orders Placed This Encounter  Procedures   DG Chest 2 View   Continue albuterol as needed   Return in about 1 year (around 11/12/2022).    Please do your part to reduce the spread of COVID-19.

## 2021-11-11 NOTE — Progress Notes (Signed)
Synopsis: Referred in December 2022 for lung nodules by Heywood Bene, *  Subjective:   PATIENT ID: Dana Jackson GENDER: female DOB: 01-10-1946, MRN: [id number]  Chief Complaint  Patient presents with   Follow-up    F/up, coughing    PMH Renal cell carcinoma in 2010 s/p nephrectomy, endometrial cancer w/ hysterectomy, chemo and radiation. Now undersurveilance imaging. Patient is a life long non-smoker.  Patient had a CT scan of the chest that was completed on11/16/2022.  This CT scan of the chest has a new 5.4 mm left upper lobe perihilar nodule on the anterior portion a 3 mm nodule in the left lower lobe and a pre-existing left lower lobe nodule that is now larger and a 6 mm right middle lobe nodule.  There is a small single right paratracheal lymph node that is larger than previous.  These images due to the patient's history of renal cell carcinoma was concerning for metastatic disease and recurrence.  Patient's surgical oncologist was contacted from [facility] by primary care.  They spoke with Dr. Clarene Essex.  He recommended a biopsy of the largest lesion.  Patient was discussed with [facility] radiology from interventional and recommended referral for evaluation of bronchoscopic tissue biopsy.  Patient here today to discuss next steps.  OV 01/07/2021: Here today for follow-up regarding bronchoscopy results.  Patient was taken forRobotic assisted navigational bronchoscopy on 12/30/2020.  Patient's pathology from the left lower lobe lung nodule was consistent with neuroendocrine tumor, neoplastic cells were TTF-1 positive, CD56, synaptic ficin and chromogranin positive.  KI-67 shows a low proliferation rate suggestive of a carcinoid or atypical carcinoid.  Today we reviewed her pathology results.  From respiratory standpoint she is doing well.  Unfortunately this did not clarify what the nodule is present on the right side.  I am thankful that the left lower lobe lesion is not metastatic  renal cell.  OV 02/18/2021: Here today for follow-up after recent repeat bronchoscopy.  Patient was taken forRepeat bronchoscopy for evaluation of the right-sided nodule that was not sampled the first go around.  We have got a few atypical cells in this biopsy lesion from the right middle lobe that was not sufficient enough material to confirm malignancy.  It is round and smooth in nature and I do expect this to be another carcinoid however our attempt was to prove whether or not she had recurrent renal cell.  From a respiratory standpoint she is doing fine.  OV 11/11/2021: Here today for follow-up.  She had a recent hospitalization for a NSTEMI.  About a week later it was rehospitalized for pneumonia.  Also had some pulmonary edema.Discharge summary from Dr. Sloan Leiter was reviewed.  She was discharged on 10/23/2021 she had a incidental finding of a cystic lesion in the pancreas recommending MRI follow-up.  She had outpatient follow-up scheduled with me regarding her lung nodules.  Her urine culture was positive for E. coli and Klebsiella pneumonia.  Clinical presentation was most likely consistent with multifocal pneumonia.  Discharged on oral antibiotics.     Past Medical History:  Diagnosis Date   Anemia    Arthritis    hands   Asthma    childhood   Bacteremia due to Escherichia coli 06/2013   Currently being treated with anti-biotics   CAD in native artery 12/2010   a) Cath for exertional angina & EKG changes: 40% LM, 80% mid LAD.  95% ost cX, 80-90% PDA --> CABG X3; b) Post CABG CATH for +  Myoview with basal anterior ischemia -> Ost LAD 80% (diffuse) then 100% after SP2, RI 70% (too small for PCI), Ost-prox Cx 99% & OM1 90%, OstrPDA 80% (small); Occluded SVG-rPDA.  Patent LIMA-dLAD, SVG-OM: culprit ~ p-m LAD pre-LIMA & RI - not good PCI target --> Med Rx   CKD (chronic kidney disease) stage 3, GFR 30-59 ml/min (HCC)    Degenerative disc disease, cervical    Degenerative disc disease, cervical  [722.4]   Diabetes mellitus without complication (HCC)    Type II   Dyspnea    Endometrial cancer ([place]) 11/2013   Treated with TAH with pelvic lymphadenectomy followed by radiation and chemotherapy   GERD (gastroesophageal reflux disease)    Hearing loss    bilateral - no hearing aids   Heart murmur    History of asthma     childhood   History of blood transfusion    History of pneumonia    "2-3 times"   History of unstable angina November/December 2012   T wave inversions in inferolateral leads.  No stress test performed.   Hyperlipidemia LDL goal <70    Hypertension, essential, benign    Neuropathy    hands   Pneumonia 2018   Renal cell carcinoma ([place]) 11/18/2008   T2aNx s/p partial left nephrectomy   S/P CABG x 3 12/2010   LIMA-LAD, SVG RPL, SVG-Circumflex   Stroke ([place])    TIA- no residual    TIA (transient ischemic attack) 1992 &  2010     Family History  Problem Relation Age of Onset   AAA (abdominal aortic aneurysm) Mother    Multiple myeloma Mother    GER disease Mother    Coronary artery disease Father    Heart disease Father    Coronary artery disease Sister    Coronary artery disease Brother    Heart disease Brother    Stroke Maternal Grandmother    Heart attack Neg Hx      Past Surgical History:  Procedure Laterality Date   ANTERIOR CERVICAL DECOMP/DISCECTOMY FUSION  10/11/2005   multi-level   AV FISTULA PLACEMENT Left 08/03/2017   Procedure: ARTERIOVENOUS (AV) FISTULA CREATION LEFT ARM;  Surgeon: Rosetta Posner, MD;  Location: [place];  Service: Vascular;  Laterality: Left;   BRONCHIAL BIOPSY  12/30/2020   Procedure: BRONCHIAL BIOPSIES;  Surgeon: Garner Nash, DO;  Location: [place] [facility];  Service: Pulmonary;;   BRONCHIAL BIOPSY  02/03/2021   Procedure: BRONCHIAL BIOPSIES;  Surgeon: Garner Nash, DO;  Location: [place] [facility];  Service: Pulmonary;;   BRONCHIAL BRUSHINGS  12/30/2020   Procedure: BRONCHIAL BRUSHINGS;  Surgeon: Garner Nash, DO;   Location: [place] [facility];  Service: Pulmonary;;   BRONCHIAL BRUSHINGS  02/03/2021   Procedure: BRONCHIAL BRUSHINGS;  Surgeon: Garner Nash, DO;  Location: [place] [facility];  Service: Pulmonary;;   BRONCHIAL NEEDLE ASPIRATION BIOPSY  12/30/2020   Procedure: BRONCHIAL NEEDLE ASPIRATION BIOPSIES;  Surgeon: Garner Nash, DO;  Location: [place];  Service: Pulmonary;;   BRONCHIAL NEEDLE ASPIRATION BIOPSY  02/03/2021   Procedure: BRONCHIAL NEEDLE ASPIRATION BIOPSIES;  Surgeon: Garner Nash, DO;  Location: [facility];  Service: Pulmonary;;   CARDIAC CATHETERIZATION  12/24/10   40% left main, 80% mid LAD, 95% ostial circumflex, 80-90% PDA.   CARDIAC CATHETERIZATION N/A 07/01/2014   Procedure: Left Heart Cath and Cors/Grafts Angiography;  Surgeon: Leonie Man, MD;  Location: [facility] LAB:  For Abn [place] @ [facility]: Ost LAD 80%, mid LAD 100%  after S/P 2, 70% RI (no PCI target), ost-prox Cx 99%, OM1 90%. Ost rPDA 80% (~ pre-CABG), occluded SVG-rPDA, patent LIMA-dLAD, SVG OM; potential culprit for abn Nuc scan = pLAD Dz, small RI 70% or PDA -> med Rx   CAROTID ENDARTERECTOMY Left    CHOLECYSTECTOMY  ~ [place] GRAFT  12/25/2010   Procedure: CORONARY ARTERY BYPASS GRAFTING (CABGX3 - LIMA-LAD, SVG RPL, SVG-Circumflex);  Surgeon: Rexene Alberts, MD;  Location: [place];  Service: Open Heart Surgery;  Laterality: N/A;   DILATATION & CURETTAGE/HYSTEROSCOPY WITH MYOSURE N/A 11/02/2013   Procedure: DILATATION & CURETTAGE/HYSTEROSCOPY WITH MYOSURE ABLATION;  Surgeon: Allena Katz, MD;  Location: [facility];  Service: Gynecology;  Laterality: N/A;   DOPPLER ECHOCARDIOGRAPHY  12/08/2010   EF =>55%,MILD CONCENTRIC LEFT VENTRICULAR HYPERTROPHY   EYE SURGERY Bilateral    Cataract   IR DIALY SHUNT INTRO NEEDLE/INTRACATH INITIAL W/IMG LEFT Left 04/24/2019   IR FLUORO GUIDE CV LINE LEFT  04/26/2019   IR US GUIDE VASC ACCESS LEFT  04/24/2019   IR US GUIDE VASC ACCESS LEFT   04/26/2019   LEFT HEART CATH AND CORS/GRAFTS ANGIOGRAPHY N/A 10/13/2021   Procedure: LEFT HEART CATH AND CORS/GRAFTS ANGIOGRAPHY;  Surgeon: Belva Crome, MD;  Location: [place] [facility];  Service: Cardiovascular;  Laterality: N/A;   LEFT HEART CATHETERIZATION WITH CORONARY ANGIOGRAM N/A 12/24/2010   Procedure: LEFT HEART CATHETERIZATION WITH CORONARY ANGIOGRAM;  Surgeon: Leonie Man, MD;  Location: [facility];  Service: Cardiovascular;  Laterality: N/A;   NM MYOCAR SINGLE W/SPECT  07/26/2007   EF 79%, LEFT VENT.FUNCTION NORMAL   PARTIAL NEPHRECTOMY Left 11/18/2008   left partial nephrectomy for renal cell CA   PET Myocardial Perfusion Scan  06/13/2014   At [place]: Moderate size, mild severity completely reversible defect involving the basal anterior, mid anterior and apical anterior segments consistent with ischemia. EF 65% with normal global function.   TONSILLECTOMY     "in college"   TOTAL ABDOMINAL HYSTERECTOMY  November 2015    At [facility]: Robotic procedure with pelvic lymphadenectomy   TRANSTHORACIC ECHOCARDIOGRAM  06/13/2014   At [place]: EF 60-65%. Grade 1 diastolic dysfunction. Mild MR. Aortic sclerosis. Moderate pulmonary hypertension   TUBAL LIGATION  ~ 1984   UPPER GI ENDOSCOPY     VIDEO BRONCHOSCOPY WITH RADIAL ENDOBRONCHIAL ULTRASOUND  12/30/2020   Procedure: RADIAL ENDOBRONCHIAL ULTRASOUND;  Surgeon: Garner Nash, DO;  Location: [place];  Service: Pulmonary;;   VIDEO BRONCHOSCOPY WITH RADIAL ENDOBRONCHIAL ULTRASOUND  02/03/2021   Procedure: RADIAL ENDOBRONCHIAL ULTRASOUND;  Surgeon: Garner Nash, DO;  Location: [place];  Service: Pulmonary;;   WISDOM TOOTH EXTRACTION      Social History   Socioeconomic History   Marital status: Married    Spouse name: [name]   Number of children: 2   Years of education: many   Highest education level: Not on file  Occupational History   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  -- Currently being very busy taking care of 2 foster children that are staying with her daughter.  A 41-year-old and a 66-year-old that they're trying to adopt.  She is very excited about the possibility of becoming a Grandmother.   She does walk and getting exercise, but she is wanting to get back into more activities just because she has really been limited due to her arthritic pains. She used to do things like walking and biking, and she may try to do some biking again, or at least stationary biking.    Does not smoke, does not drink.   Social Determinants of Health   Financial Resource Strain: Not on file  Food Insecurity: No Food Insecurity (10/19/2021)   Hunger Vital Sign    Worried About Running Out of Food in the Last Year: Never true    Ran Out of Food in the Last Year: Never true  Transportation Needs: No Transportation Needs (10/19/2021)   PRAPARE - Hydrologist (Medical): No    Lack of Transportation (Non-Medical): No  Physical Activity: Not on file  Stress: Not on file  Social Connections: Not on file  Intimate Partner Violence: Not At Risk (10/19/2021)   Humiliation, Afraid, Rape, and Kick questionnaire    Fear of Current or Ex-Partner: No    Emotionally Abused: No    Physically Abused: No    Sexually Abused: No     Allergies  Allergen Reactions   Sulfa Antibiotics Other (See Comments)    "AKI"    Erythromycin Itching   Pravastatin Other (See  Comments)    Unknown reaction   Simvastatin Other (See Comments)    Reaction not known   Codeine Nausea Only and Other (See Comments)    Tolerate Hycodan   Gemfibrozil Itching     Outpatient Medications Prior to Visit  Medication Sig Dispense Refill   albuterol (VENTOLIN HFA) 108 (90 Base) MCG/ACT inhaler Inhale into the lungs.     benzonatate (TESSALON) 100 MG capsule Take 100-200 mg by mouth 3 (three) times daily as needed for cough.     promethazine-dextromethorphan (PROMETHAZINE-DM) 6.25-15 MG/5ML syrup Take 5 mLs by mouth 4 (four) times daily as needed for cough.     acetaminophen (TYLENOL) 500 MG tablet Take 1,000 mg by mouth every 6 (six) hours as needed for moderate pain.     Alpha-Lipoic Acid 600 MG CAPS Take 600 mg by mouth daily.     aspirin 81 MG tablet Take 81 mg by mouth at bedtime.     atorvastatin (LIPITOR) 80 MG tablet Take 1 tablet (80 mg total) by mouth at bedtime. 90 tablet 0   calcitRIOL (ROCALTROL) 0.25 MCG capsule Take by mouth.     cefUROXime (CEFTIN) 250 MG tablet Take 1 tablet (250 mg total) by mouth daily. Take after dialysis on dialysis days. 2 tablet 0   cinacalcet (SENSIPAR) 30 MG tablet Take 30 mg by mouth daily with breakfast.     clopidogrel (PLAVIX) 75 MG tablet Take 1 tablet (75 mg total) by mouth daily. 90 tablet 3   cyanocobalamin (,VITAMIN B-12,) 1000 MCG/ML injection Inject 1 mL (1,000 mcg total) into the muscle every 30 (thirty) days for 90 doses. 3 mL 29   Darbepoetin Alfa (ARANESP) 60 MCG/0.3ML SOSY injection Inject 0.3 mLs (  60 mcg total) into the vein every Tuesday with hemodialysis. 4.2 mL    estradiol (ESTRACE) 0.1 MG/GM vaginal cream Place 1 Applicatorful vaginally 3 (three) times a week.     ezetimibe (ZETIA) 10 MG tablet Take 1 tablet (10 mg total) by mouth daily. 90 tablet 1   famotidine (PEPCID) 20 MG tablet Take 1 tablet (20 mg total) by mouth at bedtime. 30 tablet 2   furosemide (LASIX) 20 MG tablet Take 20 mg on Tuesdays and Saturdays 30  tablet 3   gabapentin (NEURONTIN) 100 MG capsule Take 100 mg by mouth 2 (two) times daily.     iron sucrose in sodium chloride 0.9 % 100 mL Iron Sucrose (Venofer)     isosorbide mononitrate (IMDUR) 60 MG 24 hr tablet Take 1 tablet (60 mg total) by mouth at bedtime.     lidocaine-prilocaine (EMLA) cream Apply 1 application topically Every Tuesday,Thursday,and Saturday with dialysis.     loperamide (IMODIUM A-D) 2 MG tablet Take 4 mg by mouth as needed for diarrhea or loose stools.     LORATADINE PO Take 10 mg by mouth daily.     magnesium oxide (MAG-OX) 400 MG tablet Take 400 mg by mouth daily.     Methoxy PEG-Epoetin Beta (MIRCERA IJ) Mircera     metoprolol tartrate (LOPRESSOR) 50 MG tablet Take 1 tablet (50 mg total) by mouth 2 (two) times daily. schedule an appointment for further refills, 1st attempt 120 tablet 0   nitroGLYCERIN (NITROSTAT) 0.4 MG SL tablet PLACE 1 TABLET UNDER THE TONGUE EVERY 5 MINUTES AS NEEDED FOR CHEST PAIN. (Patient taking differently: Place 0.4 mg under the tongue every 5 (five) minutes as needed for chest pain.) 25 tablet 11   Omega-3 Fatty Acids (FISH OIL) 1200 MG CAPS Take 2,400 mg by mouth in the morning and at bedtime.     omeprazole (PRILOSEC) 40 MG capsule Take 40 mg by mouth daily before breakfast.     Probiotic Product (ALIGN) 4 MG CAPS Take 4 mg by mouth daily.     pyridOXINE (VITAMIN B-6) 100 MG tablet Take 200 mg by mouth daily.      sevelamer carbonate (RENVELA) 800 MG tablet Take 800 mg by mouth See admin instructions. Take 800 mg with each meal and each snack     No facility-administered medications prior to visit.    Review of Systems  Constitutional:  Negative for chills, fever, malaise/fatigue and weight loss.  HENT:  Negative for hearing loss, sore throat and tinnitus.   Eyes:  Negative for blurred vision and double vision.  Respiratory:  Positive for cough and shortness of breath. Negative for hemoptysis, sputum production, wheezing and stridor.    Cardiovascular:  Negative for chest pain, palpitations, orthopnea, leg swelling and PND.  Gastrointestinal:  Negative for abdominal pain, constipation, diarrhea, heartburn, nausea and vomiting.  Genitourinary:  Negative for dysuria, hematuria and urgency.  Musculoskeletal:  Negative for joint pain and myalgias.  Skin:  Negative for itching and rash.  Neurological:  Negative for dizziness, tingling, weakness and headaches.  Endo/Heme/Allergies:  Negative for environmental allergies. Does not bruise/bleed easily.  Psychiatric/Behavioral:  Negative for depression. The patient is not nervous/anxious and does not have insomnia.   All other systems reviewed and are negative.    Objective:  Physical Exam Vitals reviewed.  Constitutional:      General: She is not in acute distress.    Appearance: She is well-developed.  HENT:     Head: Normocephalic and atraumatic.  Eyes:     General: No scleral icterus.    Conjunctiva/sclera: Conjunctivae normal.     Pupils: Pupils are equal, round, and reactive to light.  Neck:     Vascular: No JVD.     Trachea: No tracheal deviation.  Cardiovascular:     Rate and Rhythm: Normal rate and regular rhythm.     Heart sounds: Normal heart sounds. No murmur heard. Pulmonary:     Effort: Pulmonary effort is normal. No tachypnea, accessory muscle usage or respiratory distress.     Breath sounds: No stridor. Wheezing present. No rhonchi or rales.  Abdominal:     General: There is no distension.     Palpations: Abdomen is soft.     Tenderness: There is no abdominal tenderness.  Musculoskeletal:        General: No tenderness.     Cervical back: Neck supple.  Lymphadenopathy:     Cervical: No cervical adenopathy.  Skin:    General: Skin is warm and dry.     Capillary Refill: Capillary refill takes less than 2 seconds.     Findings: No rash.  Neurological:     Mental Status: She is alert and oriented to person, place, and time.  Psychiatric:         Behavior: Behavior normal.      Vitals:   11/11/21 1335  BP: 110/60  Pulse: 68  SpO2: 94%  Weight: 138 lb 6.4 oz (62.8 kg)  Height: 5' (1.524 m)    94% on RA BMI Readings from Last 3 Encounters:  11/11/21 27.03 kg/m  11/09/21 26.76 kg/m  10/22/21 25.70 kg/m   Wt Readings from Last 3 Encounters:  11/11/21 138 lb 6.4 oz (62.8 kg)  11/09/21 137 lb (62.1 kg)  10/22/21 131 lb 9.8 oz (59.7 kg)     CBC    Component Value Date/Time   WBC 5.5 10/22/2021 0243   RBC 2.50 (L) 10/22/2021 0243   HGB 9.0 (L) 10/22/2021 0243   HCT 25.7 (L) 10/22/2021 0243   PLT 127 (L) 10/22/2021 0243   MCV 102.8 (H) 10/22/2021 0243   MCH 36.0 (H) 10/22/2021 0243   MCHC 35.0 10/22/2021 0243   RDW 14.6 10/22/2021 0243   LYMPHSABS 0.2 (L) 10/19/2021 0617   MONOABS 0.0 (L) 10/19/2021 0617   EOSABS 0.3 10/19/2021 0617   BASOSABS 0.0 10/19/2021 0617     Chest Imaging: 11/27/2020 CT chest: Reviewed images today in the office with patient.  Multiple small pulmonary nodules 1 of which has slowly enlarged over time concerning for metastatic disease with a history of renal cell cancer. The patient's images have been independently reviewed by me.    Pulmonary Functions Testing Results:     No data to display          FeNO:   Pathology:  12/30/2020 left lower lobe fine-needle aspiration, brushings: Neuroendocrine tumor, carcinoid  Echocardiogram:   Heart Catheterization:     Assessment & Plan:     ICD-10-CM   1. 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 Multiple lung nodules on CT  R91.8     4. History of renal cell cancer  Z85.528     5. History of endometrial cancer  Z85.42     6. Non-smoker  Z78.9        Discussion:  This is  a 76 year old female diagnosed with renal cell carcinoma in 2010 status post nephrectomy, history of endometrial cancer status post  hysterectomy plus chemo and radiation found to have multiple pulmonary nodules was taken for navigational bronchoscopy and tissue sampling for diagnosis of neuroendocrine carcinoma typical carcinoid.  Due to having bilateral disease and her age currently with end-stage renal disease the decision was made for SBRT.  She underwent radiation treatments and has been doing well.  Plan: Continue surveillance imaging with follow-up with radiation oncology. She was recently diagnosed with community-acquired pneumonia after having an NSTEMI and was hospitalized. He will have a repeat chest x-ray today to make sure her lung fields are clear. I encouraged her to continue to use her albuterol as needed.  She has had some more increased intermittent asthma symptoms. Otherwise she has been doing well for the past year. She is going to follow-up with me in 1 year and follow-up with her primary care provider in between. If anything changes regarding her symptoms and encouraged her to follow-up with [place] sooner. Cystic lesion within the pancreas that she will need follow-up on by her primary care provider. Likely an abdominal MRI. She will need a repeat CT scan of the chest in 1 year October 2024. RTC in 1 year after repeat CT chest.    Current Outpatient Medications:    albuterol (VENTOLIN HFA) 108 (90 Base) MCG/ACT inhaler, Inhale into the lungs., Disp: , Rfl:    benzonatate (TESSALON) 100 MG capsule, Take 100-200 mg by mouth 3 (three) times daily as needed for cough., Disp: , Rfl:    promethazine-dextromethorphan (PROMETHAZINE-DM) 6.25-15 MG/5ML syrup, Take 5 mLs by mouth 4 (four) times daily as needed for cough., Disp: , Rfl:    acetaminophen (TYLENOL) 500 MG tablet, Take 1,000 mg by mouth every 6 (six) hours as needed for moderate pain., Disp: , Rfl:    Alpha-Lipoic Acid 600 MG CAPS, Take 600 mg by mouth daily., Disp: , Rfl:    aspirin 81 MG tablet, Take 81 mg by mouth at bedtime., Disp: , Rfl:     atorvastatin (LIPITOR) 80 MG tablet, Take 1 tablet (80 mg total) by mouth at bedtime., Disp: 90 tablet, Rfl: 0   calcitRIOL (ROCALTROL) 0.25 MCG capsule, Take by mouth., Disp: , Rfl:    cefUROXime (CEFTIN) 250 MG tablet, Take 1 tablet (250 mg total) by mouth daily. Take after dialysis on dialysis days., Disp: 2 tablet, Rfl: 0   cinacalcet (SENSIPAR) 30 MG tablet, Take 30 mg by mouth daily with breakfast., Disp: , Rfl:    clopidogrel (PLAVIX) 75 MG tablet, Take 1 tablet (75 mg total) by mouth daily., Disp: 90 tablet, Rfl: 3   cyanocobalamin (,VITAMIN B-12,) 1000 MCG/ML injection, Inject 1 mL (1,000 mcg total) into the muscle every 30 (thirty) days for 90 doses., Disp: 3 mL, Rfl: 29   Darbepoetin Alfa (ARANESP) 60 MCG/0.3ML SOSY injection, Inject 0.3 mLs (60 mcg total) into the vein every Tuesday with hemodialysis., Disp: 4.2 mL, Rfl:    estradiol (ESTRACE) 0.1 MG/GM vaginal cream, Place 1 Applicatorful vaginally 3 (three) times a week., Disp: , Rfl:    ezetimibe (ZETIA) 10 MG tablet, Take 1 tablet (10 mg total) by mouth daily., Disp: 90 tablet, Rfl: 1   famotidine (PEPCID) 20 MG tablet, Take 1 tablet (20 mg total) by mouth at bedtime., Disp: 30 tablet, Rfl: 2   furosemide (LASIX) 20 MG tablet, Take 20 mg on Tuesdays and Saturdays, Disp: 30 tablet, Rfl: 3  gabapentin (NEURONTIN) 100 MG capsule, Take 100 mg by mouth 2 (two) times daily., Disp: , Rfl:    iron sucrose in sodium chloride 0.9 % 100 mL, Iron Sucrose (Venofer), Disp: , Rfl:    isosorbide mononitrate (IMDUR) 60 MG 24 hr tablet, Take 1 tablet (60 mg total) by mouth at bedtime., Disp: , Rfl:    lidocaine-prilocaine (EMLA) cream, Apply 1 application topically Every Tuesday,Thursday,and Saturday with dialysis., Disp: , Rfl:    loperamide (IMODIUM A-D) 2 MG tablet, Take 4 mg by mouth as needed for diarrhea or loose stools., Disp: , Rfl:    LORATADINE PO, Take 10 mg by mouth daily., Disp: , Rfl:    magnesium oxide (MAG-OX) 400 MG tablet, Take 400  mg by mouth daily., Disp: , Rfl:    Methoxy PEG-Epoetin Beta (MIRCERA IJ), Mircera, Disp: , Rfl:    metoprolol tartrate (LOPRESSOR) 50 MG tablet, Take 1 tablet (50 mg total) by mouth 2 (two) times daily. schedule an appointment for further refills, 1st attempt, Disp: 120 tablet, Rfl: 0   nitroGLYCERIN (NITROSTAT) 0.4 MG SL tablet, PLACE 1 TABLET UNDER THE TONGUE EVERY 5 MINUTES AS NEEDED FOR CHEST PAIN. (Patient taking differently: Place 0.4 mg under the tongue every 5 (five) minutes as needed for chest pain.), Disp: 25 tablet, Rfl: 11   Omega-3 Fatty Acids (FISH OIL) 1200 MG CAPS, Take 2,400 mg by mouth in the morning and at bedtime., Disp: , Rfl:    omeprazole (PRILOSEC) 40 MG capsule, Take 40 mg by mouth daily before breakfast., Disp: , Rfl:    Probiotic Product (ALIGN) 4 MG CAPS, Take 4 mg by mouth daily., Disp: , Rfl:    pyridOXINE (VITAMIN B-6) 100 MG tablet, Take 200 mg by mouth daily. , Disp: , Rfl:    sevelamer carbonate (RENVELA) 800 MG tablet, Take 800 mg by mouth See admin instructions. Take 800 mg with each meal and each snack, Disp: , Rfl:     Garner Nash, DO [facility] 11/11/2021 1:54 PM

## 2021-11-28 ENCOUNTER — Other Ambulatory Visit: Payer: Self-pay | Admitting: Cardiology

## 2021-12-16 ENCOUNTER — Telehealth: Payer: Self-pay

## 2021-12-16 NOTE — Telephone Encounter (Signed)
Pt was due for labs next week per pts last ov with Diona Browner NP. Pt had lab work today at another office and its in the system (CBC, Cmet and lipid panel. Please advise if pt needs to complete form lab work.

## 2021-12-16 NOTE — Telephone Encounter (Signed)
Tried calling pt. Pt is on another call. Not able to leave a VM. Will call pt back.

## 2021-12-17 NOTE — Telephone Encounter (Signed)
Call has been addressed. Pt is aware that she doesn't need to complete lab work at this time.

## 2022-01-28 ENCOUNTER — Other Ambulatory Visit: Payer: Self-pay | Admitting: Cardiology

## 2022-02-11 ENCOUNTER — Telehealth: Payer: Self-pay | Admitting: Cardiology

## 2022-02-11 NOTE — Telephone Encounter (Signed)
   Pre-operative Risk Assessment    Patient Name: Dana Jackson  DOB: 11-Mar-1945 MRN: [id number]      Request for Surgical Clearance    Procedure:   Crown and Build Up on #19  Date of Surgery:  Clearance 02/22/22                                 Surgeon:  Dr. Vernell Morgans Surgeon's Group or Practice Name:  [facility]  Phone number:  [phone]  Fax number:  [phone]   Type of Clearance Requested:   - Medical    Type of Anesthesia:  Local    Additional requests/questions:   Caller stated patient will not be sedated for the procedure and they will be using Septocaine.  Signed, Heloise Beecham   02/11/2022, 9:50 AM

## 2022-02-11 NOTE — Telephone Encounter (Signed)
   Patient Name: Dana Jackson  DOB: 1945/04/19 MRN: [id number]  Primary Cardiologist: Glenetta Hew, MD  Chart reviewed as part of pre-operative protocol coverage.   Simple dental extractions (i.e. 1-2 teeth) and crown are considered low risk procedures per guidelines and generally do not require any specific cardiac clearance. It is also generally accepted that for simple extractions and dental cleanings, there is no need to interrupt blood thinner therapy.  SBE prophylaxis is not required for the patient from a cardiac standpoint.  I will route this recommendation to the requesting party via Epic fax function and remove from pre-op pool.  Please call with questions.  [place], [place] 02/11/2022, 12:27 PM

## 2022-02-18 ENCOUNTER — Inpatient Hospital Stay (HOSPITAL_BASED_OUTPATIENT_CLINIC_OR_DEPARTMENT_OTHER)
Admission: EM | Admit: 2022-02-18 | Discharge: 2022-02-21 | DRG: 177 | Disposition: A | Discharge status: Home / Self Care | Payer: Medicare PPO | Attending: Internal Medicine | Admitting: Internal Medicine

## 2022-02-18 ENCOUNTER — Encounter (HOSPITAL_BASED_OUTPATIENT_CLINIC_OR_DEPARTMENT_OTHER): Payer: Self-pay | Admitting: Emergency Medicine

## 2022-02-18 ENCOUNTER — Emergency Department (HOSPITAL_BASED_OUTPATIENT_CLINIC_OR_DEPARTMENT_OTHER): Payer: Medicare PPO

## 2022-02-18 ENCOUNTER — Other Ambulatory Visit: Payer: Self-pay

## 2022-02-18 ENCOUNTER — Emergency Department (HOSPITAL_BASED_OUTPATIENT_CLINIC_OR_DEPARTMENT_OTHER): Payer: Medicare PPO | Admitting: Radiology

## 2022-02-18 DIAGNOSIS — R7989 Other specified abnormal findings of blood chemistry: Secondary | ICD-10-CM

## 2022-02-18 DIAGNOSIS — Z888 Allergy status to other drugs, medicaments and biological substances status: Secondary | ICD-10-CM

## 2022-02-18 DIAGNOSIS — H9190 Unspecified hearing loss, unspecified ear: Secondary | ICD-10-CM | POA: Diagnosis present

## 2022-02-18 DIAGNOSIS — E1169 Type 2 diabetes mellitus with other specified complication: Secondary | ICD-10-CM | POA: Diagnosis present

## 2022-02-18 DIAGNOSIS — Z79899 Other long term (current) drug therapy: Secondary | ICD-10-CM

## 2022-02-18 DIAGNOSIS — Z905 Acquired absence of kidney: Secondary | ICD-10-CM

## 2022-02-18 DIAGNOSIS — I5032 Chronic diastolic (congestive) heart failure: Secondary | ICD-10-CM | POA: Diagnosis present

## 2022-02-18 DIAGNOSIS — J9601 Acute respiratory failure with hypoxia: Secondary | ICD-10-CM | POA: Diagnosis present

## 2022-02-18 DIAGNOSIS — U071 COVID-19: Secondary | ICD-10-CM | POA: Diagnosis not present

## 2022-02-18 DIAGNOSIS — J1282 Pneumonia due to coronavirus disease 2019: Secondary | ICD-10-CM | POA: Diagnosis present

## 2022-02-18 DIAGNOSIS — E785 Hyperlipidemia, unspecified: Secondary | ICD-10-CM | POA: Diagnosis present

## 2022-02-18 DIAGNOSIS — E1122 Type 2 diabetes mellitus with diabetic chronic kidney disease: Secondary | ICD-10-CM | POA: Diagnosis present

## 2022-02-18 DIAGNOSIS — Z823 Family history of stroke: Secondary | ICD-10-CM

## 2022-02-18 DIAGNOSIS — Z9221 Personal history of antineoplastic chemotherapy: Secondary | ICD-10-CM

## 2022-02-18 DIAGNOSIS — Z807 Family history of other malignant neoplasms of lymphoid, hematopoietic and related tissues: Secondary | ICD-10-CM

## 2022-02-18 DIAGNOSIS — Z6827 Body mass index (BMI) 27.0-27.9, adult: Secondary | ICD-10-CM

## 2022-02-18 DIAGNOSIS — Z85528 Personal history of other malignant neoplasm of kidney: Secondary | ICD-10-CM

## 2022-02-18 DIAGNOSIS — Z8673 Personal history of transient ischemic attack (TIA), and cerebral infarction without residual deficits: Secondary | ICD-10-CM

## 2022-02-18 DIAGNOSIS — Z7982 Long term (current) use of aspirin: Secondary | ICD-10-CM

## 2022-02-18 DIAGNOSIS — Z881 Allergy status to other antibiotic agents status: Secondary | ICD-10-CM

## 2022-02-18 DIAGNOSIS — I2510 Atherosclerotic heart disease of native coronary artery without angina pectoris: Secondary | ICD-10-CM | POA: Diagnosis present

## 2022-02-18 DIAGNOSIS — Z8542 Personal history of malignant neoplasm of other parts of uterus: Secondary | ICD-10-CM

## 2022-02-18 DIAGNOSIS — Z882 Allergy status to sulfonamides status: Secondary | ICD-10-CM

## 2022-02-18 DIAGNOSIS — R0602 Shortness of breath: Secondary | ICD-10-CM | POA: Diagnosis not present

## 2022-02-18 DIAGNOSIS — Z923 Personal history of irradiation: Secondary | ICD-10-CM

## 2022-02-18 DIAGNOSIS — D631 Anemia in chronic kidney disease: Secondary | ICD-10-CM | POA: Diagnosis present

## 2022-02-18 DIAGNOSIS — Z885 Allergy status to narcotic agent status: Secondary | ICD-10-CM

## 2022-02-18 DIAGNOSIS — Z992 Dependence on renal dialysis: Secondary | ICD-10-CM

## 2022-02-18 DIAGNOSIS — R079 Chest pain, unspecified: Secondary | ICD-10-CM

## 2022-02-18 DIAGNOSIS — Z7902 Long term (current) use of antithrombotics/antiplatelets: Secondary | ICD-10-CM

## 2022-02-18 DIAGNOSIS — K219 Gastro-esophageal reflux disease without esophagitis: Secondary | ICD-10-CM | POA: Diagnosis present

## 2022-02-18 DIAGNOSIS — I25810 Atherosclerosis of coronary artery bypass graft(s) without angina pectoris: Secondary | ICD-10-CM | POA: Diagnosis present

## 2022-02-18 DIAGNOSIS — I120 Hypertensive chronic kidney disease with stage 5 chronic kidney disease or end stage renal disease: Secondary | ICD-10-CM | POA: Diagnosis present

## 2022-02-18 DIAGNOSIS — I10 Essential (primary) hypertension: Secondary | ICD-10-CM | POA: Diagnosis present

## 2022-02-18 DIAGNOSIS — Z8701 Personal history of pneumonia (recurrent): Secondary | ICD-10-CM

## 2022-02-18 DIAGNOSIS — Z951 Presence of aortocoronary bypass graft: Secondary | ICD-10-CM

## 2022-02-18 DIAGNOSIS — E669 Obesity, unspecified: Secondary | ICD-10-CM | POA: Diagnosis present

## 2022-02-18 DIAGNOSIS — Z90710 Acquired absence of both cervix and uterus: Secondary | ICD-10-CM

## 2022-02-18 DIAGNOSIS — Z8249 Family history of ischemic heart disease and other diseases of the circulatory system: Secondary | ICD-10-CM

## 2022-02-18 DIAGNOSIS — M898X9 Other specified disorders of bone, unspecified site: Secondary | ICD-10-CM | POA: Diagnosis present

## 2022-02-18 DIAGNOSIS — N186 End stage renal disease: Secondary | ICD-10-CM

## 2022-02-18 DIAGNOSIS — N185 Chronic kidney disease, stage 5: Secondary | ICD-10-CM | POA: Diagnosis present

## 2022-02-18 DIAGNOSIS — E1129 Type 2 diabetes mellitus with other diabetic kidney complication: Secondary | ICD-10-CM | POA: Diagnosis present

## 2022-02-18 DIAGNOSIS — Z9049 Acquired absence of other specified parts of digestive tract: Secondary | ICD-10-CM

## 2022-02-18 DIAGNOSIS — I2582 Chronic total occlusion of coronary artery: Secondary | ICD-10-CM | POA: Diagnosis present

## 2022-02-18 DIAGNOSIS — Z981 Arthrodesis status: Secondary | ICD-10-CM

## 2022-02-18 LAB — BASIC METABOLIC PANEL
Anion gap: 11 (ref 5–15)
BUN: 12 mg/dL (ref 8–23)
CO2: 34 mmol/L — ABNORMAL HIGH (ref 22–32)
Calcium: 8.6 mg/dL — ABNORMAL LOW (ref 8.9–10.3)
Chloride: 94 mmol/L — ABNORMAL LOW (ref 98–111)
Creatinine, Ser: 2.42 mg/dL — ABNORMAL HIGH (ref 0.44–1.00)
GFR, Estimated: 20 mL/min — ABNORMAL LOW (ref >60)
Glucose, Bld: 163 mg/dL — ABNORMAL HIGH (ref 70–99)
Potassium: 3.7 mmol/L (ref 3.5–5.1)
Sodium: 139 mmol/L (ref 135–145)

## 2022-02-18 LAB — TROPONIN I (HIGH SENSITIVITY): Troponin I (High Sensitivity): 227 ng/L (ref <18)

## 2022-02-18 LAB — RESP PANEL BY RT-PCR (RSV, FLU A&B, COVID)  RVPGX2
Influenza A by PCR: NEGATIVE
Influenza B by PCR: NEGATIVE
Resp Syncytial Virus by PCR: NEGATIVE
SARS Coronavirus 2 by RT PCR: POSITIVE — AB

## 2022-02-18 LAB — CBC
HCT: 30.6 % — ABNORMAL LOW (ref 36.0–46.0)
Hemoglobin: 10.2 g/dL — ABNORMAL LOW (ref 12.0–15.0)
MCH: 34.6 pg — ABNORMAL HIGH (ref 26.0–34.0)
MCHC: 33.3 g/dL (ref 30.0–36.0)
MCV: 103.7 fL — ABNORMAL HIGH (ref 80.0–100.0)
Platelets: 197 10*3/uL (ref 150–400)
RBC: 2.95 MIL/uL — ABNORMAL LOW (ref 3.87–5.11)
RDW: 13.2 % (ref 11.5–15.5)
WBC: 6.2 10*3/uL (ref 4.0–10.5)
nRBC: 0 % (ref 0.0–0.2)

## 2022-02-18 LAB — D-DIMER, QUANTITATIVE: D-Dimer, Quant: 1.58 ug/mL-FEU — ABNORMAL HIGH (ref 0.00–0.50)

## 2022-02-18 MED ORDER — ASPIRIN 81 MG PO CHEW: 162.0000 mg | CHEWABLE_TABLET | ORAL | Status: DC

## 2022-02-18 MED ORDER — NITROGLYCERIN 0.4 MG SL SUBL
0.4000 mg | SUBLINGUAL_TABLET | SUBLINGUAL | Status: DC | PRN
  Administered 2022-02-18: 0.4 mg via SUBLINGUAL
  Filled 2022-02-18: qty 1

## 2022-02-18 MED ORDER — ASPIRIN 81 MG PO CHEW
324.0000 mg | CHEWABLE_TABLET | ORAL | Status: AC
  Administered 2022-02-18: 324 mg via ORAL
  Filled 2022-02-18: qty 4

## 2022-02-18 MED ORDER — IOHEXOL 350 MG/ML SOLN
100.0000 mL | Freq: Once | INTRAVENOUS | Status: AC | PRN
  Administered 2022-02-18: 60 mL via INTRAVENOUS

## 2022-02-18 NOTE — ED Triage Notes (Signed)
At HD today, had some SOB. Continued sob at home. And some CP "Last time I felt like this I had a heart attack" Husband + covid on monday

## 2022-02-18 NOTE — ED Provider Notes (Signed)
[facility] Provider Note   CSN: [id number] Arrival date & time: 02/18/22  2053     History  Chief Complaint  Patient presents with   Shortness of Breath   Chest Pain    Dana Jackson is a 77 y.o. female.  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  Reports that since yesterday has been experiencing chest tightness.  Is both exertional and pleuritic.  Spans across her chest and radiates to her shoulders.  No diaphoresis or vomiting.  Does have associated shortness of breath and congestion.  Husband was diagnosed with COVID recently.  Denies any fevers or chills.  Has not tried nitroglycerin for this.  No aspirin prior to arrival.   10/2021 heart catheterization report below CONCLUSIONS:  New culprit total occlusion of SVG to the obtuse marginal  Chronic total occlusion of SVG to PDA  Patent LIMA to apical LAD which is totally occluded proximal to the graft insertion site and in the apical segment beyond the graft and insertion site.  70% distal left main.    70% ostial LAD and sequential 80% stenoses in the proximal and mid LAD  Ostial 95 to 99% heavily calcified circumflex with severe diffuse disease in the dominant obtuse marginal.  Moderate mid RCA disease and 70 to 80% distal disease.  95% stenosis in the PDA and 70% proximal LV branch disease.  Preserved LV function with EF approximately 50 to 60% with EDP 12 mmHg       Home Medications Prior to Admission medications   Medication Sig Start Date End Date Taking? Authorizing Provider  acetaminophen (TYLENOL) 500 MG tablet Take 1,000 mg by mouth every 6 (six) hours as needed for moderate pain.    [provider]  albuterol (VENTOLIN HFA) 108  (90 Base) MCG/ACT inhaler Inhale into the lungs. 10/26/21   [provider]  Alpha-Lipoic Acid 600 MG CAPS Take 600 mg by mouth daily.    [provider]  aspirin 81 MG tablet Take 81 mg by mouth at bedtime.    [provider]  atorvastatin (LIPITOR) 80 MG tablet Take 1 tablet (80 mg total) by mouth at bedtime. 04/29/19 10/14/22  [place] Donnamarie Poag, DO  benzonatate (TESSALON) 100 MG capsule Take 100-200 mg by mouth 3 (three) times daily as needed for cough.    [provider]  calcitRIOL (ROCALTROL) 0.25 MCG capsule Take by mouth.    [provider]  cefUROXime (CEFTIN) 250 MG tablet Take 1 tablet (250 mg total) by mouth daily. Take after dialysis on dialysis days. 10/23/21   Ghimire, Henreitta Leber, MD  cinacalcet (SENSIPAR) 30 MG tablet Take 30 mg by mouth daily with breakfast. 04/18/20   [provider]  clopidogrel (PLAVIX) 75 MG tablet Take 1 tablet (75 mg total) by mouth daily. 11/09/21   Lenna Sciara, NP  cyanocobalamin (,VITAMIN B-12,) 1000 MCG/ML injection Inject 1 mL (1,000 mcg total) into the muscle every 30 (thirty) days for 90 doses. 06/27/19 10/19/26  [place] Donnamarie Poag, DO  Darbepoetin Alfa (ARANESP) 60 MCG/0.3ML SOSY injection Inject 0.3 mLs (60 mcg total) into the vein every Tuesday with hemodialysis. 05/01/19   [place] Donnamarie Poag, DO  estradiol (ESTRACE) 0.1 MG/GM vaginal cream Place 1 Applicatorful vaginally 3 (three) times a week.  [provider]  ezetimibe (ZETIA) 10 MG tablet Take 1 tablet (10 mg total) by mouth daily. 11/10/21   Lenna Sciara, NP  famotidine (PEPCID) 20 MG tablet Take 1 tablet (20 mg total) by mouth at bedtime. 04/29/19 10/14/22  [place] Donnamarie Poag, DO  furosemide (LASIX) 20 MG tablet Take 20 mg on Tuesdays and Saturdays 11/09/21   Lenna Sciara, NP  gabapentin (NEURONTIN) 100 MG capsule Take 100 mg by mouth 2 (two) times daily. 10/16/21   [provider]  iron sucrose in sodium chloride 0.9 % 100 mL Iron  Sucrose (Venofer) 10/15/21 10/14/22  [provider]  isosorbide mononitrate (IMDUR) 60 MG 24 hr tablet TAKE 1 TABLET BY MOUTH EVERY DAY 01/28/22   Leonie Man, MD  lidocaine-prilocaine (EMLA) cream Apply 1 application topically Every Tuesday,Thursday,and Saturday with dialysis. 12/20/19   [provider]  loperamide (IMODIUM A-D) 2 MG tablet Take 4 mg by mouth as needed for diarrhea or loose stools. 03/18/20   [provider]  LORATADINE PO Take 10 mg by mouth daily.    [provider]  magnesium oxide (MAG-OX) 400 MG tablet Take 400 mg by mouth daily.    [provider]  Methoxy PEG-Epoetin Beta (MIRCERA IJ) Mircera 02/26/21 02/25/22  [provider]  metoprolol tartrate (LOPRESSOR) 50 MG tablet Take 1 tablet (50 mg total) by mouth 2 (two) times daily. 11/30/21   Leonie Man, MD  nitroGLYCERIN (NITROSTAT) 0.4 MG SL tablet PLACE 1 TABLET UNDER THE TONGUE EVERY 5 MINUTES AS NEEDED FOR CHEST PAIN. Patient taking differently: Place 0.4 mg under the tongue every 5 (five) minutes as needed for chest pain. 10/15/21   Leonie Man, MD  Omega-3 Fatty Acids (FISH OIL) 1200 MG CAPS Take 2,400 mg by mouth in the morning and at bedtime.    [provider]  omeprazole (PRILOSEC) 40 MG capsule Take 40 mg by mouth daily before breakfast.    [provider]  Probiotic Product (ALIGN) 4 MG CAPS Take 4 mg by mouth daily.    [provider]  promethazine-dextromethorphan (PROMETHAZINE-DM) 6.25-15 MG/5ML syrup Take 5 mLs by mouth 4 (four) times daily as needed for cough. 09/28/21   [provider]  pyridOXINE (VITAMIN B-6) 100 MG tablet Take 200 mg by mouth daily.     [provider]  sevelamer carbonate (RENVELA) 800 MG tablet Take 800 mg by mouth See admin instructions. Take 800 mg with each meal and each snack 09/16/19   [provider]      Allergies    Sulfa antibiotics, Erythromycin, Pravastatin,  Simvastatin, Codeine, and Gemfibrozil    Review of Systems   Review of Systems  Physical Exam Updated Vital Signs BP (!) 170/64   Pulse 86   Temp 98.7 F (37.1 C)   Resp (!) 24   SpO2 92%  Physical Exam Vitals and nursing note reviewed.  Constitutional:      General: She is not in acute distress.    Appearance: She is well-developed.  HENT:     Head: Normocephalic and atraumatic.     Right Ear: External ear normal.     Left Ear: External ear normal.     Nose: Nose normal.  Eyes:     Extraocular Movements: Extraocular movements intact.     Conjunctiva/sclera: Conjunctivae normal.     Pupils: Pupils are equal, round, and reactive to light.  Cardiovascular:     Rate and Rhythm: Normal rate and  regular rhythm.     Heart sounds: No murmur heard.    Comments: Fistula over the left upper extremity with palpable thrill Pulmonary:     Effort: Pulmonary effort is normal. No respiratory distress.     Breath sounds: Normal breath sounds.  Abdominal:     General: Abdomen is flat. There is no distension.     Palpations: Abdomen is soft. There is no mass.     Tenderness: There is no abdominal tenderness. There is no guarding.  Musculoskeletal:     Cervical back: Normal range of motion and neck supple.     Right lower leg: No edema.     Left lower leg: No edema.  Skin:    General: Skin is warm and dry.  Neurological:     Mental Status: She is alert and oriented to person, place, and time. Mental status is at baseline.  Psychiatric:        Mood and Affect: Mood normal.     ED Results / Procedures / Treatments   Labs (all labs ordered are listed, but only abnormal results are displayed) Labs Reviewed  RESP PANEL BY RT-PCR (RSV, FLU A&B, COVID)  [id number] - Abnormal; Notable for the following components:      Result Value   SARS Coronavirus 2 by RT PCR POSITIVE (*)    All other components within normal limits  BASIC METABOLIC PANEL - Abnormal; Notable for the following  components:   Chloride 94 (*)    CO2 34 (*)    Glucose, Bld 163 (*)    Creatinine, Ser 2.42 (*)    Calcium 8.6 (*)    GFR, Estimated 20 (*)    All other components within normal limits  CBC - Abnormal; Notable for the following components:   RBC 2.95 (*)    Hemoglobin 10.2 (*)    HCT 30.6 (*)    MCV 103.7 (*)    MCH 34.6 (*)    All other components within normal limits  D-DIMER, QUANTITATIVE - Abnormal; Notable for the following components:   D-Dimer, Quant 1.58 (*)    All other components within normal limits  TROPONIN I (HIGH SENSITIVITY) - Abnormal; Notable for the following components:   Troponin I (High Sensitivity) 227 (*)    All other components within normal limits  TROPONIN I (HIGH SENSITIVITY) - Abnormal; Notable for the following components:   Troponin I (High Sensitivity) 252 (*)    All other components within normal limits    EKG EKG Interpretation  Date/Time:  Thursday February 18 2022 21:13:35 EST Ventricular Rate:  96 PR Interval:  126 QRS Duration: 82 QT Interval:  368 QTC Calculation: 464 R Axis:   93 Text Interpretation: Normal sinus rhythm Rightward axis Marked ST abnormality, possible inferior subendocardial injury Abnormal ECG When compared with ECG of  10/19/21 ST depressions still present in inferior and anterolateral leads Confirmed by Margaretmary Eddy [id number]) on 02/18/2022 10:28:17 PM  Radiology CT Angio Chest PE W and/or Wo Contrast  Result Date: 02/18/2022 CLINICAL DATA:  Pulmonary embolism (PE) suspected, low to intermediate prob, positive D-dimer. Shortness of breath EXAM: CT ANGIOGRAPHY CHEST WITH CONTRAST TECHNIQUE: Multidetector CT imaging of the chest was performed using the standard protocol during bolus administration of intravenous contrast. Multiplanar CT image reconstructions and MIPs were obtained to evaluate the vascular anatomy. RADIATION DOSE REDUCTION: This exam was performed according to the departmental dose-optimization program which  includes automated exposure control, adjustment of the mA and/or kV according to  patient size and/or use of iterative reconstruction technique. CONTRAST:  59m OMNIPAQUE IOHEXOL 350 MG/ML SOLN COMPARISON:  10/19/2021 FINDINGS: Cardiovascular: Prior CABG. Cardiomegaly. No evidence of aortic aneurysm. Aortic atherosclerosis. No filling defects in the pulmonary arteries to suggest pulmonary emboli. Mediastinum/Nodes: No mediastinal, hilar, or axillary adenopathy. Trachea and esophagus are unremarkable. Thyroid unremarkable. Lungs/Pleura: Platelike scarring in the left lower lobe. No confluent opacities or effusions. Upper Abdomen: No acute findings Musculoskeletal: Chest wall soft tissues are unremarkable. No acute bony abnormality. Review of the MIP images confirms the above findings. IMPRESSION: No evidence of pulmonary embolus. Cardiomegaly. No acute cardiopulmonary disease. Aortic Atherosclerosis (ICD10-I70.0). Electronically Signed   By: KRolm BaptiseM.D.   On: 02/18/2022 23:59   DG Chest 2 View  Result Date: 02/18/2022 CLINICAL DATA:  Chest pain short of breath EXAM: CHEST - 2 VIEW COMPARISON:  11/11/2021 FINDINGS: Post sternotomy changes. Hardware in the cervical spine. Minimal atelectasis or scarring in the left lower lung. No acute airspace disease, pleural effusion or pneumothorax. Stable cardiomediastinal silhouette with aortic atherosclerosis. IMPRESSION: No active cardiopulmonary disease. Minimal atelectasis or scarring in the left lower lung. Electronically Signed   By: KDonavan FoilM.D.   On: 02/18/2022 21:52    Procedures Procedures   Medications Ordered in ED Medications  nitroGLYCERIN (NITROSTAT) SL tablet 0.4 mg (0.4 mg Sublingual Given 02/18/22 2254)  aspirin tablet 81 mg (has no administration in time range)  magnesium oxide (MAG-OX) tablet 400 mg (has no administration in time range)  Alpha-Lipoic Acid CAPS 600 mg (has no administration in time range)  pyridOXINE (VITAMIN B6) tablet  200 mg (has no administration in time range)  Align CAPS 4 mg (has no administration in time range)  atorvastatin (LIPITOR) tablet 80 mg (has no administration in time range)  famotidine (PEPCID) tablet 20 mg (has no administration in time range)  sevelamer carbonate (RENVELA) tablet 800 mg (has no administration in time range)  cinacalcet (SENSIPAR) tablet 30 mg (has no administration in time range)  benzonatate (TESSALON) capsule 100-200 mg (has no administration in time range)  estradiol (ESTRACE) vaginal cream 1 Applicatorful (has no administration in time range)  acetaminophen (TYLENOL) tablet 1,000 mg (has no administration in time range)  gabapentin (NEURONTIN) capsule 100 mg (has no administration in time range)  clopidogrel (PLAVIX) tablet 75 mg (has no administration in time range)  ezetimibe (ZETIA) tablet 10 mg (has no administration in time range)  albuterol (VENTOLIN HFA) 108 (90 Base) MCG/ACT inhaler 2 puff (has no administration in time range)  metoprolol tartrate (LOPRESSOR) tablet 50 mg (has no administration in time range)  isosorbide mononitrate (IMDUR) 24 hr tablet 60 mg (has no administration in time range)  pantoprazole (PROTONIX) EC tablet 80 mg (has no administration in time range)  omega-3 acid ethyl esters (LOVAZA) capsule 1 g (has no administration in time range)  aspirin chewable tablet 324 mg (324 mg Oral Given 02/18/22 2252)  iohexol (OMNIPAQUE) 350 MG/ML injection 100 mL (60 mLs Intravenous Contrast Given 02/18/22 2345)    ED Course/ Medical Decision Making/ A&P Clinical Course as of 02/19/22 0132  Thu Feb 18, 2022  2314 Discussed with cardiologist Dr NMarcelle Smiling Feels that he would let patient recover from covid and touch base with cardiology to see if any provocative testing is indicated.  [RP]  Fri Feb 19, 2022  0108 Dr CClaria Dicefrom hositalist will admit.  [RP]    Clinical Course User Index [RP] PFransico Meadow MD  Medical  Decision Making Amount and/or Complexity of Data Reviewed Labs: ordered. Radiology: ordered.  Risk OTC drugs. Prescription drug management. Decision regarding hospitalization.   Akara Needles is a 77 y.o. 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     Initial Ddx:  MI, PE, pneumonia, URI  MDM:  Concern the patient may be having MI given the description of her pain and the fact that it is exertional and radiates to both of her shoulders with an extensive cardiac history.  Also has pleuritic and is having significant shortness of breath will obtain D-dimer to evaluate for pulmonary embolism.    Plan:  Labs Troponin D-dimer COVID/flu EKG Chest x-ray Nitroglycerin Aspirin  ED Summary/Re-evaluation:  Patient reevaluated in the emergency department and had significant improvement of her chest discomfort after nitroglycerin.  Initial troponin was elevated but similar to priors.  D-dimer was also elevated so CTA was obtained that did not show any acute abnormality including pulmonary embolism.  Was discussed with cardiology who feels that this may be related to her COVID and will see the patient if her symptoms do not improve with her URI.  Second troponin was uptrending from 227 to 252 so will admit to medicine for further management and observation.  This patient presents to the ED for concern of complaints listed in HPI, this involves an extensive number of treatment options, and is a complaint that carries with it a high risk of complications and morbidity. Disposition including potential need for admission considered.   Dispo: Admit to Floor  Additional history obtained from spouse Records reviewed Outpatient Clinic  Notes The following labs were independently interpreted: Chemistry and show CKD I independently reviewed the following imaging with scope of interpretation limited to determining acute life threatening conditions related to emergency care: Chest x-ray and agree with the radiologist interpretation with the following exceptions: none I personally reviewed and interpreted cardiac monitoring: normal sinus rhythm  I personally reviewed and interpreted the pt's EKG: see above for interpretation  I have reviewed the patients home medications and made adjustments as needed Consults: Cardiology and Hospitalist Social Determinants of health:  Elderly  Final Clinical Impression(s) / ED Diagnoses Final diagnoses:  COVID-19  Chest pain, unspecified type  Elevated troponin    Rx / DC Orders ED Discharge Orders     None         Fransico Meadow, MD 02/19/22 [phone]

## 2022-02-19 DIAGNOSIS — I120 Hypertensive chronic kidney disease with stage 5 chronic kidney disease or end stage renal disease: Secondary | ICD-10-CM | POA: Diagnosis present

## 2022-02-19 DIAGNOSIS — R7989 Other specified abnormal findings of blood chemistry: Secondary | ICD-10-CM | POA: Diagnosis present

## 2022-02-19 DIAGNOSIS — J1282 Pneumonia due to coronavirus disease 2019: Secondary | ICD-10-CM | POA: Diagnosis present

## 2022-02-19 DIAGNOSIS — N186 End stage renal disease: Secondary | ICD-10-CM | POA: Diagnosis present

## 2022-02-19 DIAGNOSIS — Z6827 Body mass index (BMI) 27.0-27.9, adult: Secondary | ICD-10-CM | POA: Diagnosis not present

## 2022-02-19 DIAGNOSIS — R079 Chest pain, unspecified: Secondary | ICD-10-CM | POA: Diagnosis not present

## 2022-02-19 DIAGNOSIS — U071 COVID-19: Secondary | ICD-10-CM | POA: Diagnosis present

## 2022-02-19 DIAGNOSIS — M898X9 Other specified disorders of bone, unspecified site: Secondary | ICD-10-CM | POA: Diagnosis present

## 2022-02-19 DIAGNOSIS — D631 Anemia in chronic kidney disease: Secondary | ICD-10-CM | POA: Diagnosis present

## 2022-02-19 DIAGNOSIS — I2510 Atherosclerotic heart disease of native coronary artery without angina pectoris: Secondary | ICD-10-CM | POA: Diagnosis present

## 2022-02-19 DIAGNOSIS — H9190 Unspecified hearing loss, unspecified ear: Secondary | ICD-10-CM | POA: Diagnosis present

## 2022-02-19 DIAGNOSIS — Z90710 Acquired absence of both cervix and uterus: Secondary | ICD-10-CM | POA: Diagnosis not present

## 2022-02-19 DIAGNOSIS — J9601 Acute respiratory failure with hypoxia: Secondary | ICD-10-CM

## 2022-02-19 DIAGNOSIS — R0602 Shortness of breath: Secondary | ICD-10-CM | POA: Diagnosis present

## 2022-02-19 DIAGNOSIS — Z8542 Personal history of malignant neoplasm of other parts of uterus: Secondary | ICD-10-CM | POA: Diagnosis not present

## 2022-02-19 DIAGNOSIS — Z992 Dependence on renal dialysis: Secondary | ICD-10-CM | POA: Diagnosis not present

## 2022-02-19 DIAGNOSIS — E1122 Type 2 diabetes mellitus with diabetic chronic kidney disease: Secondary | ICD-10-CM | POA: Diagnosis present

## 2022-02-19 DIAGNOSIS — E669 Obesity, unspecified: Secondary | ICD-10-CM | POA: Diagnosis present

## 2022-02-19 DIAGNOSIS — Z905 Acquired absence of kidney: Secondary | ICD-10-CM | POA: Diagnosis not present

## 2022-02-19 DIAGNOSIS — Z85528 Personal history of other malignant neoplasm of kidney: Secondary | ICD-10-CM | POA: Diagnosis not present

## 2022-02-19 DIAGNOSIS — K219 Gastro-esophageal reflux disease without esophagitis: Secondary | ICD-10-CM | POA: Diagnosis present

## 2022-02-19 DIAGNOSIS — E785 Hyperlipidemia, unspecified: Secondary | ICD-10-CM | POA: Diagnosis present

## 2022-02-19 DIAGNOSIS — I5032 Chronic diastolic (congestive) heart failure: Secondary | ICD-10-CM | POA: Diagnosis present

## 2022-02-19 DIAGNOSIS — I25810 Atherosclerosis of coronary artery bypass graft(s) without angina pectoris: Secondary | ICD-10-CM | POA: Diagnosis present

## 2022-02-19 DIAGNOSIS — E1169 Type 2 diabetes mellitus with other specified complication: Secondary | ICD-10-CM | POA: Diagnosis present

## 2022-02-19 DIAGNOSIS — I2582 Chronic total occlusion of coronary artery: Secondary | ICD-10-CM | POA: Diagnosis present

## 2022-02-19 LAB — CBC WITH DIFFERENTIAL/PLATELET
Abs Immature Granulocytes: 0.03 10*3/uL (ref 0.00–0.07)
Basophils Absolute: 0 10*3/uL (ref 0.0–0.1)
Basophils Relative: 1 %
Eosinophils Absolute: 0.2 10*3/uL (ref 0.0–0.5)
Eosinophils Relative: 3 %
HCT: 28.4 % — ABNORMAL LOW (ref 36.0–46.0)
Hemoglobin: 9.6 g/dL — ABNORMAL LOW (ref 12.0–15.0)
Immature Granulocytes: 1 %
Lymphocytes Relative: 19 %
Lymphs Abs: 1.1 10*3/uL (ref 0.7–4.0)
MCH: 35.2 pg — ABNORMAL HIGH (ref 26.0–34.0)
MCHC: 33.8 g/dL (ref 30.0–36.0)
MCV: 104 fL — ABNORMAL HIGH (ref 80.0–100.0)
Monocytes Absolute: 0.7 10*3/uL (ref 0.1–1.0)
Monocytes Relative: 11 %
Neutro Abs: 3.9 10*3/uL (ref 1.7–7.7)
Neutrophils Relative %: 65 %
Platelets: 194 10*3/uL (ref 150–400)
RBC: 2.73 MIL/uL — ABNORMAL LOW (ref 3.87–5.11)
RDW: 13.2 % (ref 11.5–15.5)
WBC: 5.8 10*3/uL (ref 4.0–10.5)
nRBC: 0 % (ref 0.0–0.2)

## 2022-02-19 LAB — LIPID PANEL
Cholesterol: 115 mg/dL (ref 0–200)
HDL: 30 mg/dL — ABNORMAL LOW (ref >40)
LDL Cholesterol: 59 mg/dL (ref 0–99)
Total CHOL/HDL Ratio: 3.8 RATIO
Triglycerides: 132 mg/dL (ref <150)
VLDL: 26 mg/dL (ref 0–40)

## 2022-02-19 LAB — CK: Total CK: 56 U/L (ref 38–234)

## 2022-02-19 LAB — TROPONIN I (HIGH SENSITIVITY)
Troponin I (High Sensitivity): 252 ng/L (ref <18)
Troponin I (High Sensitivity): 282 ng/L (ref <18)
Troponin I (High Sensitivity): 320 ng/L (ref <18)

## 2022-02-19 LAB — MAGNESIUM: Magnesium: 2.2 mg/dL (ref 1.7–2.4)

## 2022-02-19 LAB — COMPREHENSIVE METABOLIC PANEL
ALT: 20 U/L (ref 0–44)
AST: 24 U/L (ref 15–41)
Albumin: 2.9 g/dL — ABNORMAL LOW (ref 3.5–5.0)
Alkaline Phosphatase: 136 U/L — ABNORMAL HIGH (ref 38–126)
Anion gap: 11 (ref 5–15)
BUN: 15 mg/dL (ref 8–23)
CO2: 31 mmol/L (ref 22–32)
Calcium: 8.6 mg/dL — ABNORMAL LOW (ref 8.9–10.3)
Chloride: 95 mmol/L — ABNORMAL LOW (ref 98–111)
Creatinine, Ser: 2.93 mg/dL — ABNORMAL HIGH (ref 0.44–1.00)
GFR, Estimated: 16 mL/min — ABNORMAL LOW (ref >60)
Glucose, Bld: 105 mg/dL — ABNORMAL HIGH (ref 70–99)
Potassium: 3.8 mmol/L (ref 3.5–5.1)
Sodium: 137 mmol/L (ref 135–145)
Total Bilirubin: 0.8 mg/dL (ref 0.3–1.2)
Total Protein: 6.1 g/dL — ABNORMAL LOW (ref 6.5–8.1)

## 2022-02-19 LAB — PHOSPHORUS: Phosphorus: 3.8 mg/dL (ref 2.5–4.6)

## 2022-02-19 LAB — GLUCOSE, CAPILLARY
Glucose-Capillary: 109 mg/dL — ABNORMAL HIGH (ref 70–99)
Glucose-Capillary: 235 mg/dL — ABNORMAL HIGH (ref 70–99)
Glucose-Capillary: 216 mg/dL — ABNORMAL HIGH (ref 70–99)
Glucose-Capillary: 208 mg/dL — ABNORMAL HIGH (ref 70–99)

## 2022-02-19 LAB — LACTATE DEHYDROGENASE: LDH: 156 U/L (ref 98–192)

## 2022-02-19 LAB — C-REACTIVE PROTEIN: CRP: 1.9 mg/dL — ABNORMAL HIGH (ref <1.0)

## 2022-02-19 LAB — HEPATITIS B SURFACE ANTIGEN: Hepatitis B Surface Ag: NONREACTIVE

## 2022-02-19 LAB — PROCALCITONIN: Procalcitonin: 1.11 ng/mL

## 2022-02-19 LAB — FERRITIN: Ferritin: 1437 ng/mL — ABNORMAL HIGH (ref 11–307)

## 2022-02-19 LAB — HEMOGLOBIN A1C
Hgb A1c MFr Bld: 5.5 % (ref 4.8–5.6)
Mean Plasma Glucose: 111.15 mg/dL

## 2022-02-19 MED ORDER — FAMOTIDINE 20 MG PO TABS
20.0000 mg | ORAL_TABLET | Freq: Every day | ORAL | Status: DC
  Administered 2022-02-19 – 2022-02-20 (×3): 20 mg via ORAL
  Filled 2022-02-19 (×3): qty 1

## 2022-02-19 MED ORDER — INSULIN ASPART 100 UNIT/ML IJ SOLN
0.0000 [IU] | Freq: Every day | INTRAMUSCULAR | Status: DC
  Administered 2022-02-19 – 2022-02-20 (×2): 2 [IU] via SUBCUTANEOUS

## 2022-02-19 MED ORDER — SODIUM CHLORIDE 0.9 % IV SOLN
100.0000 mg | Freq: Every day | INTRAVENOUS | Status: DC
  Administered 2022-02-20: 100 mg via INTRAVENOUS
  Filled 2022-02-19 (×2): qty 20

## 2022-02-19 MED ORDER — ESTRADIOL 0.1 MG/GM VA CREA
1.0000 | TOPICAL_CREAM | VAGINAL | Status: DC
  Filled 2022-02-19: qty 42.5

## 2022-02-19 MED ORDER — ACETAMINOPHEN 325 MG PO TABS
650.0000 mg | ORAL_TABLET | Freq: Four times a day (QID) | ORAL | Status: DC | PRN
  Filled 2022-02-19: qty 2

## 2022-02-19 MED ORDER — ALBUTEROL SULFATE HFA 108 (90 BASE) MCG/ACT IN AERS: 2.0000 | INHALATION_SPRAY | RESPIRATORY_TRACT | Status: DC | PRN

## 2022-02-19 MED ORDER — HEPARIN SODIUM (PORCINE) 5000 UNIT/ML IJ SOLN
5000.0000 [IU] | Freq: Three times a day (TID) | INTRAMUSCULAR | Status: DC
  Administered 2022-02-19 – 2022-02-21 (×6): 5000 [IU] via SUBCUTANEOUS
  Filled 2022-02-19 (×7): qty 1

## 2022-02-19 MED ORDER — SEVELAMER CARBONATE 800 MG PO TABS
800.0000 mg | ORAL_TABLET | Freq: Three times a day (TID) | ORAL | Status: DC
  Administered 2022-02-19 – 2022-02-21 (×6): 800 mg via ORAL
  Filled 2022-02-19 (×7): qty 1

## 2022-02-19 MED ORDER — ALPHA-LIPOIC ACID 600 MG PO CAPS: 600.0000 mg | ORAL_CAPSULE | Freq: Every day | ORAL | Status: DC

## 2022-02-19 MED ORDER — OMEGA-3-ACID ETHYL ESTERS 1 G PO CAPS
1.0000 g | ORAL_CAPSULE | Freq: Two times a day (BID) | ORAL | Status: DC
  Administered 2022-02-19 – 2022-02-21 (×4): 1 g via ORAL
  Filled 2022-02-19 (×7): qty 1

## 2022-02-19 MED ORDER — ISOSORBIDE MONONITRATE ER 60 MG PO TB24
60.0000 mg | ORAL_TABLET | Freq: Every day | ORAL | Status: DC
  Administered 2022-02-19 – 2022-02-21 (×3): 60 mg via ORAL
  Filled 2022-02-19 (×4): qty 1

## 2022-02-19 MED ORDER — ALBUTEROL SULFATE (2.5 MG/3ML) 0.083% IN NEBU: 2.5000 mg | INHALATION_SOLUTION | RESPIRATORY_TRACT | Status: DC | PRN

## 2022-02-19 MED ORDER — CLOPIDOGREL BISULFATE 75 MG PO TABS
75.0000 mg | ORAL_TABLET | Freq: Every day | ORAL | Status: DC
  Administered 2022-02-19 – 2022-02-21 (×3): 75 mg via ORAL
  Filled 2022-02-19 (×3): qty 1

## 2022-02-19 MED ORDER — GABAPENTIN 100 MG PO CAPS
100.0000 mg | ORAL_CAPSULE | Freq: Two times a day (BID) | ORAL | Status: DC
  Administered 2022-02-19 – 2022-02-21 (×6): 100 mg via ORAL
  Filled 2022-02-19 (×6): qty 1

## 2022-02-19 MED ORDER — SODIUM CHLORIDE 0.9 % IV SOLN
200.0000 mg | Freq: Once | INTRAVENOUS | Status: AC
  Administered 2022-02-19: 200 mg via INTRAVENOUS
  Filled 2022-02-19 (×2): qty 40

## 2022-02-19 MED ORDER — PANTOPRAZOLE SODIUM 40 MG PO TBEC
80.0000 mg | DELAYED_RELEASE_TABLET | Freq: Every day | ORAL | Status: DC
  Administered 2022-02-19 – 2022-02-21 (×3): 80 mg via ORAL
  Filled 2022-02-19 (×3): qty 2

## 2022-02-19 MED ORDER — ASPIRIN 81 MG PO TBEC
81.0000 mg | DELAYED_RELEASE_TABLET | Freq: Every day | ORAL | Status: DC
  Administered 2022-02-19 – 2022-02-20 (×2): 81 mg via ORAL
  Filled 2022-02-19: qty 1

## 2022-02-19 MED ORDER — HYDROMORPHONE HCL 1 MG/ML IJ SOLN: 0.5000 mg | Freq: Four times a day (QID) | INTRAMUSCULAR | Status: DC | PRN

## 2022-02-19 MED ORDER — VITAMIN B-6 100 MG PO TABS
200.0000 mg | ORAL_TABLET | Freq: Every day | ORAL | Status: DC
  Administered 2022-02-19 – 2022-02-21 (×3): 200 mg via ORAL
  Filled 2022-02-19 (×4): qty 2

## 2022-02-19 MED ORDER — ATORVASTATIN CALCIUM 80 MG PO TABS
80.0000 mg | ORAL_TABLET | Freq: Every day | ORAL | Status: DC
  Administered 2022-02-19 – 2022-02-20 (×3): 80 mg via ORAL
  Filled 2022-02-19: qty 1
  Filled 2022-02-19: qty 2
  Filled 2022-02-19: qty 1

## 2022-02-19 MED ORDER — RISAQUAD PO CAPS
1.0000 | ORAL_CAPSULE | Freq: Every day | ORAL | Status: DC
  Administered 2022-02-19 – 2022-02-21 (×3): 1 via ORAL
  Filled 2022-02-19 (×4): qty 1

## 2022-02-19 MED ORDER — METHYLPREDNISOLONE SODIUM SUCC 40 MG IJ SOLR
40.0000 mg | Freq: Every day | INTRAMUSCULAR | Status: DC
  Administered 2022-02-19 – 2022-02-21 (×3): 40 mg via INTRAVENOUS
  Filled 2022-02-19 (×3): qty 1

## 2022-02-19 MED ORDER — BENZONATATE 100 MG PO CAPS: 100.0000 mg | ORAL_CAPSULE | Freq: Three times a day (TID) | ORAL | Status: DC | PRN

## 2022-02-19 MED ORDER — ACETAMINOPHEN 500 MG PO TABS: 1000.0000 mg | ORAL_TABLET | Freq: Four times a day (QID) | ORAL | Status: DC | PRN

## 2022-02-19 MED ORDER — MAGNESIUM OXIDE -MG SUPPLEMENT 400 (240 MG) MG PO TABS
400.0000 mg | ORAL_TABLET | Freq: Every day | ORAL | Status: DC
  Administered 2022-02-19 – 2022-02-21 (×3): 400 mg via ORAL
  Filled 2022-02-19 (×3): qty 1

## 2022-02-19 MED ORDER — INSULIN ASPART 100 UNIT/ML IJ SOLN
0.0000 [IU] | Freq: Three times a day (TID) | INTRAMUSCULAR | Status: DC
  Administered 2022-02-19: 3 [IU] via SUBCUTANEOUS
  Administered 2022-02-20: 1 [IU] via SUBCUTANEOUS
  Administered 2022-02-20 – 2022-02-21 (×2): 2 [IU] via SUBCUTANEOUS

## 2022-02-19 MED ORDER — CINACALCET HCL 30 MG PO TABS
30.0000 mg | ORAL_TABLET | Freq: Every day | ORAL | Status: DC
  Administered 2022-02-19 – 2022-02-21 (×3): 30 mg via ORAL
  Filled 2022-02-19 (×4): qty 1

## 2022-02-19 MED ORDER — METOPROLOL TARTRATE 50 MG PO TABS
50.0000 mg | ORAL_TABLET | Freq: Two times a day (BID) | ORAL | Status: DC
  Administered 2022-02-19 – 2022-02-21 (×6): 50 mg via ORAL
  Filled 2022-02-19: qty 1
  Filled 2022-02-19: qty 2
  Filled 2022-02-19 (×4): qty 1

## 2022-02-19 MED ORDER — CHLORHEXIDINE GLUCONATE CLOTH 2 % EX PADS
6.0000 | MEDICATED_PAD | Freq: Every day | CUTANEOUS | Status: DC
  Administered 2022-02-21: 6 via TOPICAL

## 2022-02-19 MED ORDER — DEXAMETHASONE 4 MG PO TABS: 6.0000 mg | ORAL_TABLET | Freq: Every day | ORAL | Status: DC

## 2022-02-19 MED ORDER — EZETIMIBE 10 MG PO TABS
10.0000 mg | ORAL_TABLET | Freq: Every day | ORAL | Status: DC
  Administered 2022-02-19 – 2022-02-21 (×3): 10 mg via ORAL
  Filled 2022-02-19 (×4): qty 1

## 2022-02-19 NOTE — Progress Notes (Addendum)
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  She presents to [facility] ED with complaint of chest tightness/pain.  Reported is exertional, pleuritic and nonreproducible.  It encompasses her entire torso and goes torso toes bilaterally.  Patient's husband was recently diagnosed with COVID.  The patient is COVID-positive in the ER.  She is on room air.  However patient troponins are elevated at 227 and 252.  She was given nitroglycerin and aspirin, her chest pain resolved with nitro.  Her EKG shows diffuse ST segment depression in the inferior lateral leads.  Left heart cath in 10/13/2021.  This showed CONCLUSIONS: New culprit total occlusion of SVG to the obtuse marginal Chronic total occlusion of SVG to PDA Patent LIMA to apical LAD which is totally occluded proximal to the graft insertion site and in the apical segment beyond the graft and insertion site. 70% distal left main.   70% ostial LAD and sequential 80% stenoses in the proximal and mid LAD Ostial 95 to 99% heavily calcified circumflex with severe diffuse disease in the dominant obtuse marginal. Moderate mid RCA disease and 70 to 80% distal disease.  95% stenosis in the PDA and 70% proximal LV branch disease. Preserved LV function with EF approximately 50 to 60% with EDP 12 mmHg.     RECOMMENDATIONS:   There are no good PCI or surgical options.  We will have other team members to look at pictures and give their opinion.  If refractory angina unresponsive to medical therapy we could treat the right coronary however it is the least severely diseased vessel of the 3 major epicardial coronaries.  I do not believe repeat surgery is an option at her age and with end-stage kidney  disease.   In light of the above cardiology Dr. Marcelle Smiling was contacted by EDP.  He recommended admission but no need to start heparin.  Admitting team to call if needed.  Will go ahead and initiate treatment for COVID given age and significant comorbid risk.  Patient at risk for severe disease. Nephrologist contacted via chat, he'll pass info to day team

## 2022-02-19 NOTE — Consult Note (Signed)
Renal Service Consult Note [facility]  Manpreet Riggenbach 02/19/2022 Sol Blazing, MD Requesting Physician: Erin Hearing NP  Reason for Consult: ESRD pt w/  HPI: The patient is a 77 y.o. year-old w/ PMH as below who presented w/ ED for SOB and chest pains yesterday after dialysis. Family member recently had [place]. In ED pt tested +for COVID, and O2 sats were low in the 80s. Pt was given [place] O2 and CXR was negative and CT chest was negative for acute disease. O2 sats improved and pt was admitted. Started on remdisivir and IV steroids. Also pts troponins were up in the 200 range. Cardiology called and noted she had prior cardiac work-up and was not a candidate for further intervention due to no good targets and problems w/ allergy to IV contrast. CP resolved and we are asked to see for ESRD.   Pt seen in room, up on her feet, denies any cough or SOB. CP resolved. Has not missed HD.   ROS - denies CP, no joint pain, no HA, no blurry vision, no rash, no diarrhea, no nausea/ vomiting, no dysuria, no difficulty voiding   Past Medical History  Past Medical History:  Diagnosis Date   Anemia    Arthritis    hands   Asthma    childhood   Bacteremia due to Escherichia coli 06/2013   Currently being treated with anti-biotics   CAD in native artery 12/2010   a) Cath for exertional angina & EKG changes: 40% LM, 80% mid LAD.  95% ost cX, 80-90% PDA --> CABG X3; b) Post CABG CATH for + Myoview with basal anterior ischemia -> Ost LAD 80% (diffuse) then 100% after SP2, RI 70% (too small for PCI), Ost-prox Cx 99% & OM1 90%, OstrPDA 80% (small); Occluded SVG-rPDA.  Patent LIMA-dLAD, SVG-OM: culprit ~ p-m LAD pre-LIMA & RI - not good PCI target --> Med Rx   CKD (chronic kidney disease) stage 3, GFR 30-59 ml/min (HCC)    Degenerative disc disease, cervical    Degenerative disc disease, cervical [722.4]   Diabetes mellitus without complication (HCC)    Type II   Dyspnea    Endometrial  cancer ([place]) 11/2013   Treated with TAH with pelvic lymphadenectomy followed by radiation and chemotherapy   GERD (gastroesophageal reflux disease)    Hearing loss    bilateral - no hearing aids   Heart murmur    History of asthma     childhood   History of blood transfusion    History of pneumonia    "2-3 times"   History of unstable angina November/December 2012   T wave inversions in inferolateral leads.  No stress test performed.   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   LIMA-LAD, SVG RPL, SVG-Circumflex   Stroke ([place])    TIA- no residual    TIA (transient ischemic attack) 1992 &  2010   Past Surgical History  Past Surgical History:  Procedure Laterality Date   ANTERIOR CERVICAL DECOMP/DISCECTOMY FUSION  10/11/2005   multi-level   AV FISTULA PLACEMENT Left 08/03/2017   Procedure: ARTERIOVENOUS (AV) FISTULA CREATION LEFT ARM;  Surgeon: Rosetta Posner, MD;  Location: [place];  Service: Vascular;  Laterality: Left;   BRONCHIAL BIOPSY  12/30/2020   Procedure:  BRONCHIAL BIOPSIES;  Surgeon: Garner Nash, DO;  Location: [place];  Service: Pulmonary;;   BRONCHIAL BIOPSY  02/03/2021   Procedure: BRONCHIAL BIOPSIES;  Surgeon: Garner Nash, DO;  Location: [facility];  Service: Pulmonary;;   BRONCHIAL BRUSHINGS  12/30/2020   Procedure: BRONCHIAL BRUSHINGS;  Surgeon: Garner Nash, DO;  Location: [place];  Service: Pulmonary;;   BRONCHIAL BRUSHINGS  02/03/2021   Procedure: BRONCHIAL BRUSHINGS;  Surgeon: Garner Nash, DO;  Location: [place];  Service: Pulmonary;;   BRONCHIAL NEEDLE ASPIRATION BIOPSY  12/30/2020   Procedure: BRONCHIAL NEEDLE ASPIRATION BIOPSIES;  Surgeon: Garner Nash, DO;  Location: [place];  Service: Pulmonary;;   BRONCHIAL NEEDLE ASPIRATION BIOPSY  02/03/2021    Procedure: BRONCHIAL NEEDLE ASPIRATION BIOPSIES;  Surgeon: Garner Nash, DO;  Location: [place];  Service: Pulmonary;;   CARDIAC CATHETERIZATION  12/24/10   40% left main, 80% mid LAD, 95% ostial circumflex, 80-90% PDA.   CARDIAC CATHETERIZATION N/A 07/01/2014   Procedure: Left Heart Cath and Cors/Grafts Angiography;  Surgeon: Leonie Man, MD;  [facility]: [facility]:  For Abn Nuc @ [facility]: Ost LAD 80%, mid LAD 100% after S/P 2, 70% RI (no PCI target), ost-prox Cx 99%, OM1 90%. Ost rPDA 80% (~ pre-CABG), occluded SVG-rPDA, patent LIMA-dLAD, SVG OM; potential culprit for abn Nuc scan = pLAD Dz, small RI 70% or PDA -> med Rx   CAROTID ENDARTERECTOMY Left    CHOLECYSTECTOMY  ~ [place] GRAFT  12/25/2010   Procedure: CORONARY ARTERY BYPASS GRAFTING (CABGX3 - LIMA-LAD, SVG RPL, SVG-Circumflex);  Surgeon: Rexene Alberts, MD;  Location: [place];  Service: Open Heart Surgery;  Laterality: N/A;   DILATATION & CURETTAGE/HYSTEROSCOPY WITH MYOSURE N/A 11/02/2013   Procedure: DILATATION & CURETTAGE/HYSTEROSCOPY WITH MYOSURE ABLATION;  Surgeon: Allena Katz, MD;  Location: [facility];  Service: Gynecology;  Laterality: N/A;   DOPPLER ECHOCARDIOGRAPHY  12/08/2010   EF =>55%,MILD CONCENTRIC LEFT VENTRICULAR HYPERTROPHY   EYE SURGERY Bilateral    Cataract   IR DIALY SHUNT INTRO NEEDLE/INTRACATH INITIAL W/IMG LEFT Left 04/24/2019   IR FLUORO GUIDE CV LINE LEFT  04/26/2019   IR US GUIDE VASC ACCESS LEFT  04/24/2019   IR US GUIDE VASC ACCESS LEFT  04/26/2019   LEFT HEART CATH AND CORS/GRAFTS ANGIOGRAPHY N/A 10/13/2021   Procedure: LEFT HEART CATH AND CORS/GRAFTS ANGIOGRAPHY;  Surgeon: Belva Crome, MD;  Location: [place] [facility];  Service: Cardiovascular;  Laterality: N/A;   LEFT HEART CATHETERIZATION WITH CORONARY ANGIOGRAM N/A 12/24/2010   Procedure: LEFT HEART CATHETERIZATION WITH CORONARY ANGIOGRAM;  Surgeon: Leonie Man, MD;  Location: [facility];   Service: Cardiovascular;  Laterality: N/A;   NM MYOCAR SINGLE W/SPECT  07/26/2007   EF 79%, LEFT VENT.FUNCTION NORMAL   PARTIAL NEPHRECTOMY Left 11/18/2008   left partial nephrectomy for renal cell CA   PET Myocardial Perfusion Scan  06/13/2014   At [facility]: Moderate size, mild severity completely reversible defect involving the basal anterior, mid anterior and apical anterior segments consistent with ischemia. EF 65% with normal global function.   TONSILLECTOMY     "in college"   TOTAL ABDOMINAL HYSTERECTOMY  November 2015    At [facility]: Robotic procedure with pelvic lymphadenectomy   TRANSTHORACIC ECHOCARDIOGRAM  06/13/2014   At [facility]: EF 60-65%. Grade 1 diastolic dysfunction. Mild MR. Aortic sclerosis. Moderate pulmonary hypertension   TUBAL LIGATION  ~  1984   UPPER GI ENDOSCOPY     VIDEO BRONCHOSCOPY WITH RADIAL ENDOBRONCHIAL ULTRASOUND  12/30/2020   Procedure: RADIAL ENDOBRONCHIAL ULTRASOUND;  Surgeon: Garner Nash, DO;  Location: [place];  Service: Pulmonary;;   VIDEO BRONCHOSCOPY WITH RADIAL ENDOBRONCHIAL ULTRASOUND  02/03/2021   Procedure: RADIAL ENDOBRONCHIAL ULTRASOUND;  Surgeon: Garner Nash, DO;  Location: [facility];  Service: Pulmonary;;   WISDOM TOOTH EXTRACTION     Family History  Family History  Problem Relation Age of Onset   AAA (abdominal aortic aneurysm) Mother    Multiple myeloma Mother    GER disease Mother    Coronary artery disease Father    Heart disease Father    Coronary artery disease Sister    Coronary artery disease Brother    Heart disease Brother    Stroke Maternal Grandmother    Heart attack Neg Hx    Social History  reports that she has never smoked. She has never used smokeless tobacco. She reports that she does not currently use alcohol. She reports that she does not use drugs. Allergies  Allergies  Allergen Reactions   Sulfa Antibiotics Other (See Comments)    "AKI"    Erythromycin Itching    Pravastatin Other (See Comments)    Unknown reaction   Simvastatin Other (See Comments)    Reaction not known   Codeine Nausea Only and Other (See Comments)    Tolerate Hycodan   Gemfibrozil Itching   Home medications Prior to Admission medications   Medication Sig Start Date End Date Taking? Authorizing Provider  acetaminophen (TYLENOL) 500 MG tablet Take 1,000 mg by mouth every 6 (six) hours as needed for moderate pain.   Yes [provider]  albuterol (VENTOLIN HFA) 108 (90 Base) MCG/ACT inhaler Inhale 1-2 puffs into the lungs every 4 (four) hours as needed for wheezing or shortness of breath. 10/26/21  Yes [provider]  Alpha-Lipoic Acid 600 MG CAPS Take 600 mg by mouth daily.   Yes [provider]  aspirin 81 MG tablet Take 81 mg by mouth at bedtime.   Yes [provider]  atorvastatin (LIPITOR) 80 MG tablet Take 1 tablet (80 mg total) by mouth at bedtime. 04/29/19 10/14/22 Yes [place] Eric J, DO  benzonatate (TESSALON) 100 MG capsule Take 100-200 mg by mouth 3 (three) times daily as needed for cough.   Yes [provider]  cinacalcet (SENSIPAR) 30 MG tablet Take 30 mg by mouth daily with breakfast. 04/18/20  Yes [provider]  clopidogrel (PLAVIX) 75 MG tablet Take 1 tablet (75 mg total) by mouth daily. 11/09/21  Yes Monge, Helane Gunther, NP  Darbepoetin Alfa (ARANESP) 60 MCG/0.3ML SOSY injection Inject 0.3 mLs (60 mcg total) into the vein every Tuesday with hemodialysis. Patient taking differently: Inject 60 mcg into the vein See admin instructions. Given at dialysis 05/01/19  Yes [place] Donnamarie Poag, DO  estradiol (ESTRACE) 0.1 MG/GM vaginal cream Place 1 Applicatorful vaginally 3 (three) times a week.   Yes [provider]  ezetimibe (ZETIA) 10 MG tablet Take 1 tablet (10 mg total) by mouth daily. 11/10/21  Yes Monge, Helane Gunther, NP  famotidine (PEPCID) 20 MG tablet Take 1 tablet (20 mg total) by mouth at bedtime. 04/29/19 10/14/22 Yes  [place] Donnamarie Poag, DO  furosemide (LASIX) 20 MG tablet Take 20 mg on Tuesdays and Saturdays Patient taking differently: Take 20 mg by mouth See admin instructions. Tuesdays and Saturdays 11/09/21  Yes Lenna Sciara, NP  gabapentin (NEURONTIN) 100 MG capsule Take 100 mg by mouth 2 (two) times daily. 10/16/21  Yes [provider]  isosorbide mononitrate (IMDUR) 60 MG 24 hr tablet TAKE 1 TABLET BY MOUTH EVERY DAY Patient taking differently: Take 60 mg by mouth at bedtime. 01/28/22  Yes Leonie Man, MD  lidocaine-prilocaine (EMLA) cream Apply 1 application topically Every Tuesday,Thursday,and Saturday with dialysis. 12/20/19  Yes [provider]  loperamide (IMODIUM A-D) 2 MG tablet Take 4 mg by mouth as needed for diarrhea or loose stools. 03/18/20  Yes [provider]  loratadine (CLARITIN) 10 MG tablet Take 10 mg by mouth daily.   Yes [provider]  magnesium oxide (MAG-OX) 400 MG tablet Take 400 mg by mouth daily.   Yes [provider]  metoprolol tartrate (LOPRESSOR) 50 MG tablet Take 1 tablet (50 mg total) by mouth 2 (two) times daily. 11/30/21  Yes Leonie Man, MD  nitroGLYCERIN (NITROSTAT) 0.4 MG SL tablet PLACE 1 TABLET UNDER THE TONGUE EVERY 5 MINUTES AS NEEDED FOR CHEST PAIN. Patient taking differently: Place 0.4 mg under the tongue every 5 (five) minutes as needed for chest pain. 10/15/21  Yes Leonie Man, MD  Omega-3 Fatty Acids (FISH OIL) 1200 MG CAPS Take 2,400 mg by mouth in the morning and at bedtime.   Yes [provider]  omeprazole (PRILOSEC) 40 MG capsule Take 40 mg by mouth daily before breakfast.   Yes [provider]  Probiotic Product (ALIGN) 4 MG CAPS Take 4 mg by mouth daily.   Yes [provider]  promethazine-dextromethorphan (PROMETHAZINE-DM) 6.25-15 MG/5ML syrup Take 5 mLs by mouth 4 (four) times daily as needed for cough. 09/28/21  Yes [provider]  pyridOXINE (VITAMIN B-6) 100 MG  tablet Take 200 mg by mouth daily.    Yes [provider]  sevelamer carbonate (RENVELA) 800 MG tablet Take 800 mg by mouth See admin instructions. Take 800 mg with each meal and each snack 09/16/19  Yes [provider]     Vitals:   02/19/22 0548 02/19/22 0600 02/19/22 0713 02/19/22 1407  BP: 105/82  (!) 144/58 (!) 108/94  Pulse:  (!) 53 75 76  Resp:   15 17  Temp: (!) 97.4 F (36.3 C)  98.4 F (36.9 C) 98.3 F (36.8 C)  TempSrc: Oral  Oral Oral  SpO2:  92% 96%    Exam Gen alert, no distress No rash, cyanosis or gangrene Sclera anicteric, throat clear  No jvd or bruits Chest clear bilat to bases, no rales/ wheezing RRR no MRG Abd soft ntnd no mass or ascites +bs GU defer MS no joint effusions or deformity Ext no LE or UE edema, no wounds or ulcers Neuro is alert, Ox 3 , nf    LUA AVF+bruit      Home meds include - ventolin hfa, asa, lipitor, sensipar 30 qam, plavix, extrace, zetia, pepcid, lasix, gabapentin 100 bid, imdur 60, mg oxide 400 qd, metoprolol 50 bid, sl ntg, prilosec, renvela 800 tid, prns/ vits/ supps     OP HD: TTS NW 4h  400/600   62.5kg  2/2 bath  L AVF  Hep 2000 - last HD 2.08, post wt 62.7kg - venofer 28m weekly iv - mircera 75 mcg IV q2, last given 2/08, due 2/22    Assessment/ Plan: AHRF due to COVID pna - getting O2, IV steroids and remdesivir per pmd Chest pain/ hx known 3V CAD + prior CABG - CP better,  per pmd ESRD - on HD TTS. HD tomorrow.  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  MBD ckd - CCa and phos in range. Cont po sensipar qd and renvela 800 ac tid as binder. DM2  per pmd    Kelly Splinter  MD [facility] 02/19/2022, 2:28 PM  Recent Labs  Lab 02/18/22 2111 02/19/22 0713  HGB 10.2* 9.6*  ALBUMIN  --  2.9*  CALCIUM 8.6* 8.6*  PHOS  --  3.8  CREATININE 2.42* 2.93*  K 3.7 3.8   Inpatient medications:  acidophilus  1 capsule Oral  Daily   aspirin EC  81 mg Oral QHS   atorvastatin  80 mg Oral QHS   cinacalcet  30 mg Oral Q breakfast   clopidogrel  75 mg Oral Daily   estradiol  1 Applicatorful Vaginal Once per day on Mon Wed Fri   ezetimibe  10 mg Oral Daily   famotidine  20 mg Oral QHS   gabapentin  100 mg Oral BID   heparin  5,000 Units Subcutaneous Q8H   insulin aspart  0-5 Units Subcutaneous QHS   insulin aspart  0-9 Units Subcutaneous TID WC   isosorbide mononitrate  60 mg Oral Daily   magnesium oxide  400 mg Oral Daily   methylPREDNISolone (SOLU-MEDROL) injection  40 mg Intravenous Daily   metoprolol tartrate  50 mg Oral BID   omega-3 acid ethyl esters  1 g Oral BID   pantoprazole  80 mg Oral Daily   pyridOXINE  200 mg Oral Daily   sevelamer carbonate  800 mg Oral TID WC    [START ON 02/20/2022] remdesivir 100 mg in sodium chloride 0.9 % 100 mL IVPB     acetaminophen, albuterol, benzonatate, HYDROmorphone (DILAUDID) injection, nitroGLYCERIN

## 2022-02-19 NOTE — Progress Notes (Signed)
Contacted by nephrologist. Pt may be stable for d/c in the morning and receive out-pt HD at pt's clinic ([facility]). Pt's normal arrival time is 11:00 for 11:20 chair time. Contacted clinic and spoke to [place]. Clinic aware pt may be stable for d/c tomorrow am and to receive out-pt HD at clinic. Clinic is aware pt is covid positive. Pt will need to call clinic when she arrives and remain in her car until a staff member comes to get pt. Staff will provide pt a N95 at that time. Clinic is requesting that nephrology staff please contact clinic in the am with an update since navigator is not available on the weekends. Contacted nephrologist and renal PA with this request. Attempted to reach pt and pt's husband via phone to discuss the above but unable to reach either one of them. Contacted inpt HD unit to request pt be placed on 2nd shift tomorrow in case pt able to d/c in the am and get to out-pt clinic. Nephrologist placed 2nd shift request in order as well. Added info to pt's AVS as well.   Melven Sartorius Renal Navigator [phone]

## 2022-02-19 NOTE — Progress Notes (Signed)
  Transition of Care [facility]) Screening Note   Patient Details  Name: [name] Date of Birth: Aug 16, 1945   Transition of Care [facility]) CM/SW Contact:    Sharin Mons, RN Phone Number:  02/19/2022, 10:46 AM   Presents with SOB,CP/ acute hypoxemic respiratory failure, COVID-19. Hx of CAD, DM2, CKD/dialysis (TTS),renal cell carcinoma/nephrectomy hypertension, dyslipidemia, endometrial cancer. From home with spouse. PTA independent with ADL's. Transition of Care Department [facility]) has reviewed patient and no TOC needs have been identified at this time. We will continue to monitor patient advancement through interdisciplinary progression rounds. If new patient transition needs arise, please place a TOC consult.

## 2022-02-19 NOTE — H&P (Signed)
History and Physical    Patient: Dana Jackson [id number] DOB: 27-Jun-1945 DOA: 02/18/2022 DOS: the patient was seen and examined on 02/19/2022 PCP: Heywood Bene, PA-C  Patient coming from: Home with husband  Chief Complaint:  Chief Complaint  Patient presents with   Shortness of Breath   Chest Pain   HPI: Dana Jackson is a 77 y.o. 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  Patient presented to the [facility] ED after developing shortness of breath post dialysis treatment yesterday 2/8.  She was also experiencing chest pain that improved after the administration of nitrates.  Patient's husband recently diagnosed with COVID and has subsequently just come off of isolation.  Subsequently patient's COVID PCR returned positive after testing in the ED.  Patient was hypoxemic with O2 sats resting on room air in the 83 to 86% range.  She was placed on 2 L nasal cannula oxygen with improvement in sats to greater than 90%.  CTA of the chest was clear without infiltrates and no evidence of PE in the setting of mildly elevated D-dimer 1.58.  Patient was subsequently transferred to [facility] for admission to treat acute hypoxemic respiratory failure in the setting of COVID-19.  In addition to the above as noted patient was having chest discomfort with unchanged EKG and mild elevation in troponin in the 200 range.  EDP contacted cardiology who reviewed patient's history and recent EKG and labs.  Patient is not a candidate for any intervention due to her underlying CKD on dialysis and increased risk with administration of IV contrast studies.  Also on previous  catheterization there were no good targets to perform PTCA or stent placement.  Plan is to continue to monitor.  At this point patient is chest pain-free.  Nephrology was also notified of admission and next hemodialysis is not due until tomorrow 2/10.  Patient's potassium level is within normal levels and she does not appear to be volume overloaded.  Review of Systems: As mentioned in the history of present illness. All other systems reviewed and are negative.   Past Medical History:  Diagnosis Date   Anemia    Arthritis    hands   Asthma    childhood   Bacteremia due to Escherichia coli 06/2013   Currently being treated with anti-biotics   CAD in native artery 12/2010   a) Cath for exertional angina & EKG changes: 40% LM, 80% mid LAD.  95% ost cX, 80-90% PDA --> CABG X3; b) Post CABG CATH for + Myoview with basal anterior ischemia -> Ost LAD 80% (diffuse) then 100% after SP2, RI 70% (too small for PCI), Ost-prox Cx 99% & OM1 90%, OstrPDA 80% (small); Occluded SVG-rPDA.  Patent LIMA-dLAD, SVG-OM: culprit ~ p-m LAD pre-LIMA & RI - not good PCI target --> Med Rx   CKD (chronic kidney disease) stage 3, GFR 30-59 ml/min (HCC)    Degenerative disc disease, cervical    Degenerative disc disease, cervical [722.4]   Diabetes mellitus without complication (HCC)    Type II   Dyspnea    Endometrial cancer ([place]) 11/2013   Treated with TAH with pelvic lymphadenectomy followed by radiation and chemotherapy   GERD (gastroesophageal reflux disease)    Hearing loss  bilateral - no hearing aids   Heart murmur    History of asthma     childhood   History of blood transfusion    History of pneumonia    "2-3 times"   History of unstable angina November/December 2012   T wave inversions in inferolateral leads.  No stress test performed.   Hyperlipidemia LDL goal <70    Hypertension, essential, benign    Neuropathy    hands   Pneumonia 2018   Renal cell carcinoma ([place]) 11/18/2008   T2aNx s/p  partial left nephrectomy   S/P CABG x 3 12/2010   LIMA-LAD, SVG RPL, SVG-Circumflex   Stroke ([place])    TIA- no residual    TIA (transient ischemic attack) 1992 &  2010   Past Surgical History:  Procedure Laterality Date   ANTERIOR CERVICAL DECOMP/DISCECTOMY FUSION  10/11/2005   multi-level   AV FISTULA PLACEMENT Left 08/03/2017   Procedure: ARTERIOVENOUS (AV) FISTULA CREATION LEFT ARM;  Surgeon: Rosetta Posner, MD;  Location: [place];  Service: Vascular;  Laterality: Left;   BRONCHIAL BIOPSY  12/30/2020   Procedure: BRONCHIAL BIOPSIES;  Surgeon: Garner Nash, DO;  Location: [place] [facility];  Service: Pulmonary;;   BRONCHIAL BIOPSY  02/03/2021   Procedure: BRONCHIAL BIOPSIES;  Surgeon: Garner Nash, DO;  Location: [place] [facility];  Service: Pulmonary;;   BRONCHIAL BRUSHINGS  12/30/2020   Procedure: BRONCHIAL BRUSHINGS;  Surgeon: Garner Nash, DO;  Location: [place] [facility];  Service: Pulmonary;;   BRONCHIAL BRUSHINGS  02/03/2021   Procedure: BRONCHIAL BRUSHINGS;  Surgeon: Garner Nash, DO;  Location: [place] [facility];  Service: Pulmonary;;   BRONCHIAL NEEDLE ASPIRATION BIOPSY  12/30/2020   Procedure: BRONCHIAL NEEDLE ASPIRATION BIOPSIES;  Surgeon: Garner Nash, DO;  Location: [place];  Service: Pulmonary;;   BRONCHIAL NEEDLE ASPIRATION BIOPSY  02/03/2021   Procedure: BRONCHIAL NEEDLE ASPIRATION BIOPSIES;  Surgeon: Garner Nash, DO;  Location: [place] [facility];  Service: Pulmonary;;   CARDIAC CATHETERIZATION  12/24/10   40% left main, 80% mid LAD, 95% ostial circumflex, 80-90% PDA.   CARDIAC CATHETERIZATION N/A 07/01/2014   Procedure: Left Heart Cath and Cors/Grafts Angiography;  Surgeon: Leonie Man, MD;  Location: [facility] LAB:  For Abn Nuc @ [facility]: Ost LAD 80%, mid LAD 100% after S/P 2, 70% RI (no PCI target), ost-prox Cx 99%, OM1 90%. Ost rPDA 80% (~ pre-CABG), occluded SVG-rPDA, patent LIMA-dLAD, SVG OM; potential culprit for abn Nuc scan = pLAD Dz, small RI 70% or PDA -> med  Rx   CAROTID ENDARTERECTOMY Left    CHOLECYSTECTOMY  ~ [place] GRAFT  12/25/2010   Procedure: CORONARY ARTERY BYPASS GRAFTING (CABGX3 - LIMA-LAD, SVG RPL, SVG-Circumflex);  Surgeon: Rexene Alberts, MD;  Location: [place];  Service: Open Heart Surgery;  Laterality: N/A;   DILATATION & CURETTAGE/HYSTEROSCOPY WITH MYOSURE N/A 11/02/2013   Procedure: DILATATION & CURETTAGE/HYSTEROSCOPY WITH MYOSURE ABLATION;  Surgeon: Allena Katz, MD;  Location: [facility];  Service: Gynecology;  Laterality: N/A;   DOPPLER ECHOCARDIOGRAPHY  12/08/2010   EF =>55%,MILD CONCENTRIC LEFT VENTRICULAR HYPERTROPHY   EYE SURGERY Bilateral    Cataract   IR DIALY SHUNT INTRO NEEDLE/INTRACATH INITIAL W/IMG LEFT Left 04/24/2019   IR FLUORO GUIDE CV LINE LEFT  04/26/2019   IR US GUIDE VASC ACCESS LEFT  04/24/2019   IR US GUIDE VASC ACCESS LEFT  04/26/2019   LEFT HEART CATH AND CORS/GRAFTS ANGIOGRAPHY N/A  10/13/2021   Procedure: LEFT HEART CATH AND CORS/GRAFTS ANGIOGRAPHY;  Surgeon: Belva Crome, MD;  Location: [place] [facility];  Service: Cardiovascular;  Laterality: N/A;   LEFT HEART CATHETERIZATION WITH CORONARY ANGIOGRAM N/A 12/24/2010   Procedure: LEFT HEART CATHETERIZATION WITH CORONARY ANGIOGRAM;  Surgeon: Leonie Man, MD;  Location: [facility] C[facility];  Service: Cardiovascular;  Laterality: N/A;   NM MYOCAR SINGLE W/SPECT  07/26/2007   EF 79%, LEFT VENT.FUNCTION NORMAL   PARTIAL NEPHRECTOMY Left 11/18/2008   left partial nephrectomy for renal cell CA   PET Myocardial Perfusion Scan  06/13/2014   At [place]: Moderate size, mild severity completely reversible defect involving the basal anterior, mid anterior and apical anterior segments consistent with ischemia. EF 65% with normal global function.   TONSILLECTOMY     "in college"   TOTAL ABDOMINAL HYSTERECTOMY  November 2015    At [facility]: Robotic procedure with pelvic lymphadenectomy   TRANSTHORACIC ECHOCARDIOGRAM   06/13/2014   At [place]: EF 60-65%. Grade 1 diastolic dysfunction. Mild MR. Aortic sclerosis. Moderate pulmonary hypertension   TUBAL LIGATION  ~ 1984   UPPER GI ENDOSCOPY     VIDEO BRONCHOSCOPY WITH RADIAL ENDOBRONCHIAL ULTRASOUND  12/30/2020   Procedure: RADIAL ENDOBRONCHIAL ULTRASOUND;  Surgeon: Garner Nash, DO;  Location: [place];  Service: Pulmonary;;   VIDEO BRONCHOSCOPY WITH RADIAL ENDOBRONCHIAL ULTRASOUND  02/03/2021   Procedure: RADIAL ENDOBRONCHIAL ULTRASOUND;  Surgeon: Garner Nash, DO;  Location: [place];  Service: Pulmonary;;   WISDOM TOOTH EXTRACTION     Social History:  reports that she has never smoked. She has never used smokeless tobacco. She reports that she does not currently use alcohol. She reports that she does not use drugs.  Allergies  Allergen Reactions   Sulfa Antibiotics Other (See Comments)    "AKI"    Erythromycin Itching   Pravastatin Other (See Comments)    Unknown reaction   Simvastatin Other (See Comments)    Reaction not known   Codeine Nausea Only and Other (See Comments)    Tolerate Hycodan   Gemfibrozil Itching    Family History  Problem Relation Age of Onset   AAA (abdominal aortic aneurysm) Mother    Multiple myeloma Mother    GER disease Mother    Coronary artery disease Father    Heart disease Father    Coronary artery disease Sister    Coronary artery disease Brother    Heart disease Brother    Stroke Maternal Grandmother    Heart attack Neg Hx     Prior to Admission medications   Medication Sig Start Date End Date Taking? Authorizing Provider  acetaminophen (TYLENOL) 500 MG tablet Take 1,000 mg by mouth every 6 (six) hours as needed for moderate pain.    [provider]  albuterol (VENTOLIN HFA) 108 (90 Base) MCG/ACT inhaler Inhale into the lungs. 10/26/21   [provider]  Alpha-Lipoic Acid 600 MG CAPS Take 600 mg by mouth daily.    [provider]  aspirin 81 MG tablet Take  81 mg by mouth at bedtime.    [provider]  atorvastatin (LIPITOR) 80 MG tablet Take 1 tablet (80 mg total) by mouth at bedtime. 04/29/19 10/14/22  [place] Donnamarie Poag, DO  benzonatate (TESSALON) 100 MG capsule Take 100-200 mg by mouth 3 (three) times daily as needed for cough.    [provider]  calcitRIOL (ROCALTROL) 0.25 MCG capsule Take by mouth.    [provider]  cefUROXime (CEFTIN) 250 MG tablet Take 1 tablet (250 mg total) by mouth daily. Take after dialysis on dialysis days. 10/23/21   Ghimire, Henreitta Leber, MD  cinacalcet (SENSIPAR) 30 MG tablet Take 30 mg by mouth daily with breakfast. 04/18/20   [provider]  clopidogrel (PLAVIX) 75 MG tablet Take 1 tablet (75 mg total) by mouth daily. 11/09/21   Lenna Sciara, NP  cyanocobalamin (,VITAMIN B-12,) 1000 MCG/ML injection Inject 1 mL (1,000 mcg total) into the muscle every 30 (thirty) days for 90 doses. 06/27/19 10/19/26  [place] Donnamarie Poag, DO  Darbepoetin Alfa (ARANESP) 60 MCG/0.3ML SOSY injection Inject 0.3 mLs (60 mcg total) into the vein every Tuesday with hemodialysis. 05/01/19   [place] Donnamarie Poag, DO  estradiol (ESTRACE) 0.1 MG/GM vaginal cream Place 1 Applicatorful vaginally 3 (three) times a week.    [provider]  ezetimibe (ZETIA) 10 MG tablet Take 1 tablet (10 mg total) by mouth daily. 11/10/21   Lenna Sciara, NP  famotidine (PEPCID) 20 MG tablet Take 1 tablet (20 mg total) by mouth at bedtime. 04/29/19 10/14/22  [place] Donnamarie Poag, DO  furosemide (LASIX) 20 MG tablet Take 20 mg on Tuesdays and Saturdays 11/09/21   Lenna Sciara, NP  gabapentin (NEURONTIN) 100 MG capsule Take 100 mg by mouth 2 (two) times daily. 10/16/21   [provider]  iron sucrose in sodium chloride 0.9 % 100 mL Iron Sucrose (Venofer) 10/15/21 10/14/22  [provider]  isosorbide mononitrate (IMDUR) 60 MG 24 hr tablet TAKE 1 TABLET BY MOUTH EVERY DAY 01/28/22   Leonie Man, MD  lidocaine-prilocaine (EMLA)  cream Apply 1 application topically Every Tuesday,Thursday,and Saturday with dialysis. 12/20/19   [provider]  loperamide (IMODIUM A-D) 2 MG tablet Take 4 mg by mouth as needed for diarrhea or loose stools. 03/18/20   [provider]  LORATADINE PO Take 10 mg by mouth daily.    [provider]  magnesium oxide (MAG-OX) 400 MG tablet Take 400 mg by mouth daily.    [provider]  Methoxy PEG-Epoetin Beta (MIRCERA IJ) Mircera 02/26/21 02/25/22  [provider]  metoprolol tartrate (LOPRESSOR) 50 MG tablet Take 1 tablet (50 mg total) by mouth 2 (two) times daily. 11/30/21   Leonie Man, MD  nitroGLYCERIN (NITROSTAT) 0.4 MG SL tablet PLACE 1 TABLET UNDER THE TONGUE EVERY 5 MINUTES AS NEEDED FOR CHEST PAIN. Patient taking differently: Place 0.4 mg under the tongue every 5 (five) minutes as needed for chest pain. 10/15/21   Leonie Man, MD  Omega-3 Fatty Acids (FISH OIL) 1200 MG CAPS Take 2,400 mg by mouth in the morning and at bedtime.    [provider]  omeprazole (PRILOSEC) 40 MG capsule Take 40 mg by mouth daily before breakfast.    [provider]  Probiotic Product (ALIGN) 4 MG CAPS Take 4 mg by mouth daily.    [provider]  promethazine-dextromethorphan (PROMETHAZINE-DM) 6.25-15 MG/5ML syrup Take 5 mLs by mouth 4 (four) times daily as needed for cough. 09/28/21   [provider]  pyridOXINE (VITAMIN B-6) 100 MG tablet Take 200 mg by mouth daily.     [provider]  sevelamer carbonate (RENVELA) 800 MG tablet Take 800 mg by mouth See admin instructions. Take 800 mg with each meal and each snack 09/16/19   [provider]    Physical Exam: Vitals:   02/19/22 0400 02/19/22 0415 02/19/22 0548 02/19/22 0600  BP:   105/82   Pulse: 72 69  (!) 53  Resp: 19 13    Temp:   (!) 97.4 F (36.3 C)   TempSrc:   Oral   SpO2: (!) 89% 94%  92%   Constitutional: NAD, calm, comfortable sitting up in  chair preparing to order breakfast Eyes: PERRL, lids and conjunctivae normal ENMT: Mucous membrames pink and moist Respiratory: Anterior lung sounds clear to auscultation.  Posterior lung sounds with bilateral crackles mid fields down.  No increased work of breathing at rest.  O2 sats greater than 90% on 2 L nasal cannula oxygen. Cardiovascular: Regular rate and rhythm, no murmurs / rubs / gallops. No extremity edema. 2+ pedal pulses.  Abdomen: no tenderness, no masses palpated. No hepatosplenomegaly. Bowel sounds positive.  Musculoskeletal: no clubbing / cyanosis. No joint deformity upper and lower extremities. Good ROM, no contractures. Normal muscle tone.  Skin: no rashes, lesions, ulcers. No induration Neurologic: CN 2-12 grossly intact. Sensation intact, Strength 5/5 x all 4 extremities.  Psychiatric: Normal judgment and insight. Alert and oriented x 3. Normal mood.    Data Reviewed:  As per HPI  Troponin 227-252 WBC 6.2 with hemoglobin 10.2 MCV 103.7 and platelets 197,000 Sodium 139, potassium 3.7, chloride 94, CO2 34, glucose 163, BUN 12, creatinine 2.42  Repeat CMET and CBC pending for this morning CK, CRP and LFTs pending   Assessment and Plan: Acute hypoxemic respiratory failure secondary to COVID-19 pneumonia Continue supportive care with oxygen and wean as tolerated-most recent resting O2 sats on room air in the mid 80s Initiate IV Solu-Medrol daily as well as remdesivir-likely a 3 to 5-day course depending on respiratory status Continue airborne precautions and isolation Mobilize and utilize incentive spirometry and/or flutter valve Follow D-dimer, ferritin, CRP and platelets daily Continue subcutaneous heparin for DVT prophylaxis especially in the setting of COVID induced hyper inflammatory state in context of elevated D-dimer  Chest pain and patient with known severe three-vessel CAD and prior CABG Currently chest pain-free and most recent EKG unchanged from  October Appreciate cardiology assistance-as previously documented "there are no good PCI or surgical options.  Patient continues to have refractory angina unresponsive to medical therapy consideration could be given to treatment of the right coronary given it was still a severely diseased vessel on the last cath." Continue preadmission medications of baby aspirin, statin, omega-3 fatty acids, Plavix, Zetia, Imdur, and beta-blocker Continue to trend troponin and check EKG as needed chest pain  Diabetes mellitus 2 Controlled with diet and dialysis Follow CBGs AC/at bedtime during acute illness as well as with administration of steroids Provide SSI Check hemoglobin A1c  CKD 5 on dialysis Continue usual treatment schedule TTS Appreciate assistance of nephrology Continue Renelva  Anemia of chronic kidney disease Patient on Aranesp with HD treatments as an outpatient  Hypertension Cardiac medications as listed above  Dyslipidemia Continue statin and omega-3 fatty acids     Advance Care Planning:   Code Status: Full Code   Consults: Cardiology and nephrology  Family Communication: Patient only at bedside.  Severity of Illness: The appropriate patient status for this patient is INPATIENT. Inpatient status is judged to be reasonable and necessary in order to provide the required intensity of service to ensure the patient's safety. The patient's presenting symptoms, physical exam findings, and initial radiographic and laboratory data in the context of their chronic comorbidities is felt to place them at high risk for further clinical deterioration. Furthermore, it is not anticipated that the  patient will be medically stable for discharge from the hospital within 2 midnights of admission.   * I certify that at the point of admission it is my clinical judgment that the patient will require inpatient hospital care spanning beyond 2 midnights from the point of admission due to high intensity of  service, high risk for further deterioration and high frequency of surveillance required.*  Author: Erin Hearing, NP 02/19/2022 6:38 AM  For on call review [email or web address].

## 2022-02-19 NOTE — Progress Notes (Signed)
PHARMACIST - PHYSICIAN ORDER COMMUNICATION  CONCERNING: P&T Medication Policy on Herbal Medications  DESCRIPTION:  This patient's order for:  Alpha Lipoic Acid  has been noted.  This product(s) is classified as an "herbal" or natural product. Due to a lack of definitive safety studies or FDA approval, nonstandard manufacturing practices, plus the potential risk of unknown drug-drug interactions while on inpatient medications, the Pharmacy and Therapeutics Committee does not permit the use of "herbal" or natural products of this type within [place].   ACTION TAKEN: The pharmacy department is unable to verify this order at this time and your patient has been informed of this safety policy. Please reevaluate patient's clinical condition at discharge and address if the herbal or natural product(s) should be resumed at that time.   

## 2022-02-19 NOTE — ED Notes (Signed)
Second time trying to call report

## 2022-02-19 NOTE — Progress Notes (Signed)
Patient arrived from [place] via EMS. Patient on 2L of oxygen. IV saline locked. No family at bedside. Patient in no acute distress. Patient denies being in pain or SHOB. Patient is A&O4 and ambulates independently. Will initiate plan of care.

## 2022-02-19 NOTE — ED Notes (Signed)
First time trying to call report

## 2022-02-20 DIAGNOSIS — U071 COVID-19: Secondary | ICD-10-CM | POA: Diagnosis not present

## 2022-02-20 DIAGNOSIS — J9601 Acute respiratory failure with hypoxia: Secondary | ICD-10-CM | POA: Diagnosis not present

## 2022-02-20 LAB — RENAL FUNCTION PANEL
Albumin: 2.9 g/dL — ABNORMAL LOW (ref 3.5–5.0)
Anion gap: 11 (ref 5–15)
BUN: 32 mg/dL — ABNORMAL HIGH (ref 8–23)
CO2: 28 mmol/L (ref 22–32)
Calcium: 8.8 mg/dL — ABNORMAL LOW (ref 8.9–10.3)
Chloride: 96 mmol/L — ABNORMAL LOW (ref 98–111)
Creatinine, Ser: 4.2 mg/dL — ABNORMAL HIGH (ref 0.44–1.00)
GFR, Estimated: 10 mL/min — ABNORMAL LOW (ref >60)
Glucose, Bld: 125 mg/dL — ABNORMAL HIGH (ref 70–99)
Phosphorus: 3.6 mg/dL (ref 2.5–4.6)
Potassium: 4.2 mmol/L (ref 3.5–5.1)
Sodium: 135 mmol/L (ref 135–145)

## 2022-02-20 LAB — C-REACTIVE PROTEIN: CRP: 3.1 mg/dL — ABNORMAL HIGH (ref <1.0)

## 2022-02-20 LAB — GLUCOSE, CAPILLARY
Glucose-Capillary: 107 mg/dL — ABNORMAL HIGH (ref 70–99)
Glucose-Capillary: 149 mg/dL — ABNORMAL HIGH (ref 70–99)
Glucose-Capillary: 150 mg/dL — ABNORMAL HIGH (ref 70–99)
Glucose-Capillary: 238 mg/dL — ABNORMAL HIGH (ref 70–99)

## 2022-02-20 LAB — FERRITIN: Ferritin: 1152 ng/mL — ABNORMAL HIGH (ref 11–307)

## 2022-02-20 LAB — D-DIMER, QUANTITATIVE: D-Dimer, Quant: 0.65 ug/mL-FEU — ABNORMAL HIGH (ref 0.00–0.50)

## 2022-02-20 MED ORDER — HEPARIN SODIUM (PORCINE) 1000 UNIT/ML DIALYSIS: 2000.0000 [IU] | INTRAMUSCULAR | Status: DC | PRN

## 2022-02-20 MED ORDER — HEPARIN SODIUM (PORCINE) 1000 UNIT/ML DIALYSIS: 2000.0000 [IU] | Freq: Once | INTRAMUSCULAR | Status: DC

## 2022-02-20 NOTE — Hospital Course (Addendum)
77 y.o.f w/ 3-vessel CAD not a candidate for intervention, diabetes melitis 2,ESRD on HD TTS, history of renal cell carcinoma status post nephrectomy, hypertension, dyslipidemia, history of endometrial cancer treated with hysterectomy and chronic anemia likely related to renal disease presented with shortness of breath post dialysis treatment 2/8, also had chest pain that improved with nitrates.  Husband recently had COVID-69. In the ED patient hypoxic 83 to 86% needing 2 L nasal cannula CTA of the chest was clear without infiltrates and no evidence of PE in the setting of mildly elevated D-dimer 1.58.  Patient was subsequently transferred to [facility] for admission to treat acute hypoxemic respiratory failure in the setting of COVID-19.  EKG unchanged with mild troponin leak in 200- EDP contacted cardiology who reviewed patient's history and recent EKG and labs>she is not a candidate for any intervention due to her underlying CKD on dialysis and increased risk with administration of IV contrast studies.  Also on previous catheterization there were no good targets to perform PTCA or stent placement-advised to monitor, and patient was chest pain-free on admission.  Nephro was consulted for HD.

## 2022-02-20 NOTE — Progress Notes (Signed)
PROGRESS NOTE Dana Jackson  [id number] DOB: [birth year] DOA: 02/18/2022 PCP: Heywood Bene, PA-C   Brief Narrative/Hospital Course: 77 y.o.f w/ 3-vessel CAD not a candidate for intervention, diabetes melitis 2,ESRD on HD TTS, history of renal cell carcinoma status post nephrectomy, hypertension, dyslipidemia, history of endometrial cancer treated with hysterectomy and chronic anemia likely related to renal disease presented with shortness of breath post dialysis treatment 2/8, also had chest pain that improved with nitrates.  Husband recently had COVID-76. In the ED patient hypoxic 83 to 86% needing 2 L nasal cannula CTA of the chest was clear without infiltrates and no evidence of PE in the setting of mildly elevated D-dimer 1.58.  Patient was subsequently transferred to [facility] for admission to treat acute hypoxemic respiratory failure in the setting of COVID-19.  EKG unchanged with mild troponin leak in 200- EDP contacted cardiology who reviewed patient's history and recent EKG and labs>she is not a candidate for any intervention due to her underlying CKD on dialysis and increased risk with administration of IV contrast studies.  Also on previous catheterization there were no good targets to perform PTCA or stent placement-advised to monitor, and patient was chest pain-free on admission.  Nephro was consulted for HD.    Subjective: Seen and examined this morning. Overnight -afebrile BP stable, w/ labs creat up 2.4> 2.9,d dimer  down. No longer having flulike symptoms doing well on room air, no chest pain shortness of breath. Doing dialysis  Assessment and Plan: Principal Problem:   Acute hypoxemic respiratory failure due to COVID-19 [facility]) Active Problems:   Essential hypertension   Obesity (BMI 30-39.9)   S/P CABG x 3   Hyperlipidemia associated with type 2 diabetes mellitus ([place])   Type 2 diabetes mellitus with renal manifestations, controlled ([place])   CKD (chronic kidney  disease) stage 5, GFR less than 15 ml/min (HCC)   ESRD on hemodialysis ([place])  Acute hypoxic respiratory failure due to COVID infection COVID-19 infection initially hypoxic: Hypoxia resolved, continue remdesivir, steroid - encourage ambulation,continue pulmonary support continue sternal precaution, monitor inflammatory markers CRP but D-dimer downtrending.  CT angio chest no PE cardiomegaly no acute cardiopulmonary finding noted  ESRD on TTS getting HD today appreciate nephrology input, volume status stable.  Chest pain:in the setting of COVID infection, with chronically elevated troponin. CAD not a candidate for intervention.  Based on previous cardiology input"there are no good PCI or surgical options.  Patient continues to have refractory angina unresponsive to medical therapy consideration could be given to treatment of the right coronary given it was still a severely diseased vessel on the last cath".  Troponin [id number] downtrending/flat. No chest pain this morning, continue aspirin, Lipitor, Plavix, Imdur.  EDP had discussed with cardiology at this time no further recommendation.  Type 2 diabetes mellitus:controlled with diet,continue SSI HbA1c stable 5.5. Recent Labs  Lab 02/19/22 0642 02/19/22 0713 02/19/22 1152 02/19/22 1629 02/19/22 2014 02/20/22 0850  GLUCAP 109*  --  235* 216* 208* 107*  HGBA1C  --  5.5  --   --   --   --     Anemia of chronic kidney disease on iron as with HD outpatient monitor hemoglobin Recent Labs  Lab 02/18/22 2111 02/19/22 0713  HGB 10.2* 9.6*  HCT 30.6* 28.4*    Hypertension:BP stable continue metoprolol [id number] statin omega-3 fatty acid. Overweight Patient's Body mass index is 27.21 kg/m. : Will benefit with PCP follow-up, weight loss  healthy lifestyle and outpatient sleep evaluation.  DVT  prophylaxis: heparin injection 5,000 Units Start: 02/19/22 1400 Code Status:   Code Status: Full Code Family Communication: plan of care discussed  with patient  at bedside.  Obtain PT OT evaluation and mobilize Patient status is: Inpatient because of COVID-19 infection Level of care: Telemetry Medical   Dispo: The patient is from: Home            Anticipated disposition: Home tomorrow if stable Objective: Vitals last 24 hrs: Vitals:   02/20/22 1015 02/20/22 1030 02/20/22 1045 02/20/22 1100  BP: (!) 118/52 134/61 (!) 122/45 (!) 111/57  Pulse: 78 82 74 71  Resp: 20 16 18 16  $ Temp:      TempSrc:      SpO2: 93% 95% 93% 97%  Weight:       Weight change:   Physical Examination: General exam: alert awake, older than stated age HEENT:Oral mucosa moist, Ear/Nose WNL grossly Respiratory system: bilaterally clear  BS, no use of accessory muscle Cardiovascular system: S1 & S2 +, No JVD. Gastrointestinal system: Abdomen soft,NT,ND, BS+ Nervous System:Alert, awake, moving extremities. Extremities: LE edema neg,distal peripheral pulses palpable.  Skin: No rashes,no icterus. MSK: Normal muscle bulk,tone, power  Medications reviewed:  Scheduled Meds:  acidophilus  1 capsule Oral Daily   aspirin EC  81 mg Oral QHS   atorvastatin  80 mg Oral QHS   Chlorhexidine Gluconate Cloth  6 each Topical Q0600   cinacalcet  30 mg Oral Q breakfast   clopidogrel  75 mg Oral Daily   estradiol  1 Applicatorful Vaginal Once per day on Mon Wed Fri   ezetimibe  10 mg Oral Daily   famotidine  20 mg Oral QHS   gabapentin  100 mg Oral BID   [START ON 02/21/2022] heparin  2,000 Units Dialysis Once in dialysis   heparin  5,000 Units Subcutaneous Q8H   insulin aspart  0-5 Units Subcutaneous QHS   insulin aspart  0-9 Units Subcutaneous TID WC   isosorbide mononitrate  60 mg Oral Daily   magnesium oxide  400 mg Oral Daily   methylPREDNISolone (SOLU-MEDROL) injection  40 mg Intravenous Daily   metoprolol tartrate  50 mg Oral BID   omega-3 acid ethyl esters  1 g Oral BID   pantoprazole  80 mg Oral Daily   pyridOXINE  200 mg Oral Daily   sevelamer  carbonate  800 mg Oral TID WC   Continuous Infusions:  remdesivir 100 mg in sodium chloride 0.9 % 100 mL IVPB      Diet Order             Diet Carb Modified Fluid consistency: Thin; Room service appropriate? Yes  Diet effective now                  Intake/Output Summary (Last 24 hours) at 02/20/2022 1110 Last data filed at 02/19/2022 1700 Gross per 24 hour  Intake 770.05 ml  Output --  Net 770.05 ml   Net IO Since Admission: 1,010.05 mL [02/20/22 1110]  Wt Readings from Last 3 Encounters:  02/20/22 63.2 kg  11/11/21 62.8 kg  11/09/21 62.1 kg     Unresulted Labs (From admission, onward)     Start     Ordered   02/20/22 0500  Ferritin  Daily,   R      02/19/22 0759   02/20/22 0500  C-reactive protein  Daily,   R      02/19/22 0759   02/20/22 0500  D-dimer, quantitative  Daily,   R      02/19/22 0759   02/19/22 1444  Hepatitis B surface antibody,quantitative  (New Admission Hemo Labs (Hepatitis B))  Once,   R       Question:  Specimen collection method  Answer:  Lab=Lab collect   02/19/22 1445   02/19/22 0631  CBC  (heparin)  Once,   R       Comments: Baseline for heparin therapy IF NOT ALREADY DRAWN.  Notify MD if PLT < 100 K.    02/19/22 0633          Data Reviewed: I have personally reviewed following labs and imaging studies CBC: Recent Labs  Lab 02/18/22 2111 02/19/22 0713  WBC 6.2 5.8  NEUTROABS  --  3.9  HGB 10.2* 9.6*  HCT 30.6* 28.4*  MCV 103.7* 104.0*  PLT 197 [id number]   Basic Metabolic Panel: Recent Labs  Lab 02/18/22 2111 02/19/22 0713 02/20/22 0709  NA 139 137 135  K 3.7 3.8 4.2  CL 94* 95* 96*  CO2 34* 31 28  GLUCOSE 163* 105* 125*  BUN 12 15 32*  CREATININE 2.42* 2.93* 4.20*  CALCIUM 8.6* 8.6* 8.8*  MG  --  2.2  --   PHOS  --  3.8 3.6  GFR: Estimated Creatinine Clearance: 9.3 mL/min (A) (by C-G formula based on SCr of 4.2 mg/dL (H)). Liver Function Tests: Recent Labs  Lab 02/19/22 0713 02/20/22 0709  AST 24  --   ALT 20  --    ALKPHOS 136*  --   BILITOT 0.8  --   PROT 6.1*  --   ALBUMIN 2.9* 2.9*   Recent Labs  Lab 02/19/22 0642 02/19/22 1152 02/19/22 1629 02/19/22 2014 02/20/22 0850  GLUCAP 109* 235* 216* 208* 107*   Lipid Profile: Recent Labs    02/19/22 0713  CHOL 115  HDL 30*  LDLCALC 59  TRIG 132  CHOLHDL 3.8   Recent Labs  Lab 02/19/22 0713  PROCALCITON 1.11   Recent Results (from the past 240 hour(s))  Resp panel by RT-PCR (RSV, Flu A&B, Covid) Nasopharyngeal Swab     Status: Abnormal   Collection Time: 02/18/22  9:11 PM   Specimen: Nasopharyngeal Swab; Nasal Swab  Result Value Ref Range Status   SARS Coronavirus 2 by RT PCR POSITIVE (A) NEGATIVE Final    Comment: (NOTE) SARS-CoV-2 target nucleic acids are DETECTED.  The SARS-CoV-2 RNA is generally detectable in upper respiratory specimens during the acute phase of infection. Positive results are indicative of the presence of the identified virus, but do not rule out bacterial infection or co-infection with other pathogens not detected by the test. Clinical correlation with patient history and other diagnostic information is necessary to determine patient infection status. The expected result is Negative.  Fact Sheet for Patients: EntrepreneurPulse.com.au  Fact Sheet for Healthcare Providers: IncredibleEmployment.be  This test is not yet approved or cleared by the [place] FDA and  has been authorized for detection and/or diagnosis of SARS-CoV-2 by FDA under an Emergency Use Authorization (EUA).  This EUA will remain in effect (meaning this test can be used) for the duration of  the COVID-19 declaration under Section 564(b)(1) of the A ct, 21 U.S.C. section 360bbb-3(b)(1), unless the authorization is terminated or revoked sooner.     Influenza A by PCR NEGATIVE NEGATIVE Final   Influenza B by PCR NEGATIVE NEGATIVE Final    Comment: (NOTE) The Xpert Xpress SARS-CoV-2/FLU/RSV  plus assay is intended as  an aid in the diagnosis of influenza from Nasopharyngeal swab specimens and should not be used as a sole basis for treatment. Nasal washings and aspirates are unacceptable for Xpert Xpress SARS-CoV-2/FLU/RSV testing.  Fact Sheet for Patients: EntrepreneurPulse.com.au  Fact Sheet for Healthcare Providers: IncredibleEmployment.be  This test is not yet approved or cleared by the [place] FDA and has been authorized for detection and/or diagnosis of SARS-CoV-2 by FDA under an Emergency Use Authorization (EUA). This EUA will remain in effect (meaning this test can be used) for the duration of the COVID-19 declaration under Section 564(b)(1) of the Act, 21 U.S.C. section 360bbb-3(b)(1), unless the authorization is terminated or revoked.     Resp Syncytial Virus by PCR NEGATIVE NEGATIVE Final    Comment: (NOTE) Fact Sheet for Patients: EntrepreneurPulse.com.au  Fact Sheet for Healthcare Providers: IncredibleEmployment.be  This test is not yet approved or cleared by the [place] FDA and has been authorized for detection and/or diagnosis of SARS-CoV-2 by FDA under an Emergency Use Authorization (EUA). This EUA will remain in effect (meaning this test can be used) for the duration of the COVID-19 declaration under Section 564(b)(1) of the Act, 21 U.S.C. section 360bbb-3(b)(1), unless the authorization is terminated or revoked.  Performed at [facility], [address], [place], [place] [address]     Antimicrobials: Anti-infectives (From admission, onward)    Start     Dose/Rate Route Frequency Ordered Stop   02/20/22 1000  remdesivir 100 mg in sodium chloride 0.9 % 100 mL IVPB       See Hyperspace for full Linked Orders Report.   100 mg 200 mL/hr over 30 Minutes Intravenous Daily 02/19/22 0633 02/22/22 0959   02/19/22 0800  remdesivir 200 mg in sodium  chloride 0.9% 250 mL IVPB       See Hyperspace for full Linked Orders Report.   200 mg 580 mL/hr over 30 Minutes Intravenous Once 02/19/22 [id number] 02/19/22 1146      Culture/Microbiology    Component Value Date/Time   SDES BLOOD SITE NOT SPECIFIED 10/19/2021 0617   SPECREQUEST  10/19/2021 0617    BOTTLES DRAWN AEROBIC AND ANAEROBIC Blood Culture results may not be optimal due to an inadequate volume of blood received in culture bottles   CULT  10/19/2021 0617    NO GROWTH 5 DAYS Performed at [facility], [place] [address], [place], [place] [address]    REPTSTATUS 10/24/2021 FINAL 10/19/2021 0617    Other culture-see note  Radiology Studies: CT Angio Chest PE W and/or Wo Contrast  Result Date: 02/18/2022 CLINICAL DATA:  Pulmonary embolism (PE) suspected, low to intermediate prob, positive D-dimer. Shortness of breath EXAM: CT ANGIOGRAPHY CHEST WITH CONTRAST TECHNIQUE: Multidetector CT imaging of the chest was performed using the standard protocol during bolus administration of intravenous contrast. Multiplanar CT image reconstructions and MIPs were obtained to evaluate the vascular anatomy. RADIATION DOSE REDUCTION: This exam was performed according to the departmental dose-optimization program which includes automated exposure control, adjustment of the mA and/or kV according to patient size and/or use of iterative reconstruction technique. CONTRAST:  61m OMNIPAQUE IOHEXOL 350 MG/ML SOLN COMPARISON:  10/19/2021 FINDINGS: Cardiovascular: Prior CABG. Cardiomegaly. No evidence of aortic aneurysm. Aortic atherosclerosis. No filling defects in the pulmonary arteries to suggest pulmonary emboli. Mediastinum/Nodes: No mediastinal, hilar, or axillary adenopathy. Trachea and esophagus are unremarkable. Thyroid unremarkable. Lungs/Pleura: Platelike scarring in the left lower lobe. No confluent opacities or effusions. Upper Abdomen: No acute findings Musculoskeletal: Chest wall soft  tissues are  unremarkable. No acute bony abnormality. Review of the MIP images confirms the above findings. IMPRESSION: No evidence of pulmonary embolus. Cardiomegaly. No acute cardiopulmonary disease. Aortic Atherosclerosis (ICD10-I70.0). Electronically Signed   By: Rolm Baptise M.D.   On: 02/18/2022 23:59   DG Chest 2 View  Result Date: 02/18/2022 CLINICAL DATA:  Chest pain short of breath EXAM: CHEST - 2 VIEW COMPARISON:  11/11/2021 FINDINGS: Post sternotomy changes. Hardware in the cervical spine. Minimal atelectasis or scarring in the left lower lung. No acute airspace disease, pleural effusion or pneumothorax. Stable cardiomediastinal silhouette with aortic atherosclerosis. IMPRESSION: No active cardiopulmonary disease. Minimal atelectasis or scarring in the left lower lung. Electronically Signed   By: Donavan Foil M.D.   On: 02/18/2022 21:52     LOS: 1 day   Antonieta Pert, MD [facility]  02/20/2022, 11:10 AM

## 2022-02-20 NOTE — Procedures (Signed)
HD Note  Unable to ensure that this patient will be able to be treated on the second shift.  Will complete treatment this morning.

## 2022-02-20 NOTE — Procedures (Signed)
HD Note:  Some information was entered later than the data was gathered due to patient care needs. The stated time with the data is accurate.  Received treatment at bedside.  Alert and oriented.  Informed consent signed and in chart.   TX duration: 3.5 hours  The patient had O2 sat drop into the lower 80s when she began to doze off.  O2 applied at 2 l/m during treatmemt  Alert, without acute distress.  Hand-off given to patient's nurse.   Access used: Left Upper Arm Fistula Access issues: None  Total UF removed: 1200 ml Medication(s) given as ordered, see MAR    [facility]

## 2022-02-20 NOTE — Progress Notes (Signed)
[facility] Progress Note  Subjective: seen in room, flu-like sx's have abated, no c/o's today, no SOB or DOE. Has been up and around.   Vitals:   02/20/22 0845 02/20/22 0900 02/20/22 0915 02/20/22 0930  BP: (!) 141/57 (!) 152/65 134/83 133/67  Pulse: 73 79 79 80  Resp: 18 (!) 22  (!) 24  Temp:      TempSrc:      SpO2: 94% 92% 93% 96%    Exam: Gen alert, no distress No jvd or bruits Chest no change, stable, cta RRR no MRG Abd soft ntnd no mass or ascites +bs Ext no LE edema Neuro is alert, Ox 3 , nf    LUA AVF+bruit     Home meds include - ventolin hfa, asa, lipitor, sensipar 30 qam, plavix, extrace, zetia, pepcid, lasix, gabapentin 100 bid, imdur 60, mg oxide 400 qd, metoprolol 50 bid, sl ntg, prilosec, renvela 800 tid, prns/ vits/ supps        OP HD: TTS NW 4h  400/600   62.5kg  2/2 bath  L AVF  Hep 2000 - last HD 2.08, post wt 62.7kg - venofer 48m weekly iv - mircera 75 mcg IV q2, last given 2/08, due 2/22       Assessment/ Plan: AHRF due to COVID pna - getting O2, IV steroids and remdesivir per pmd. Is on RA today, no signs of vol overload on exam or by wts.  Chest pain/ hx known 3V CAD + prior CABG - CP better, per pmd ESRD - on HD TTS. HD today.  HTN/ volume - euvolemic on exam, 1-2 kg up today --> UF same w/ HD today Anemia esrd - Hb 9-11 here, last OP esa was yesterday, follow.  MBD ckd - CCa and phos in range. Cont po sensipar qd and renvela 800 ac tid as binder. DM2  per pmd Dispo - ok for dc from renal standpoint   RKelly SplinterMD [facility] 02/20/2022, 9:38 AM  Recent Labs  Lab 02/18/22 2111 02/19/22 0713 02/20/22 0709  HGB 10.2* 9.6*  --   ALBUMIN  --  2.9* 2.9*  CALCIUM 8.6* 8.6* 8.8*  PHOS  --  3.8 3.6  CREATININE 2.42* 2.93* 4.20*  K 3.7 3.8 4.2   Recent Labs  Lab 02/19/22 0713 02/20/22 0404  FERRITIN 1,437* 1,152*   Inpatient medications:  acidophilus  1 capsule Oral Daily   aspirin EC  81 mg Oral QHS   atorvastatin  80  mg Oral QHS   Chlorhexidine Gluconate Cloth  6 each Topical Q0600   cinacalcet  30 mg Oral Q breakfast   clopidogrel  75 mg Oral Daily   estradiol  1 Applicatorful Vaginal Once per day on Mon Wed Fri   ezetimibe  10 mg Oral Daily   famotidine  20 mg Oral QHS   gabapentin  100 mg Oral BID   [START ON 02/21/2022] heparin  2,000 Units Dialysis Once in dialysis   heparin  5,000 Units Subcutaneous Q8H   insulin aspart  0-5 Units Subcutaneous QHS   insulin aspart  0-9 Units Subcutaneous TID WC   isosorbide mononitrate  60 mg Oral Daily   magnesium oxide  400 mg Oral Daily   methylPREDNISolone (SOLU-MEDROL) injection  40 mg Intravenous Daily   metoprolol tartrate  50 mg Oral BID   omega-3 acid ethyl esters  1 g Oral BID   pantoprazole  80 mg Oral Daily   pyridOXINE  200 mg Oral Daily  sevelamer carbonate  800 mg Oral TID WC    remdesivir 100 mg in sodium chloride 0.9 % 100 mL IVPB     acetaminophen, albuterol, benzonatate, [START ON 02/21/2022] heparin, HYDROmorphone (DILAUDID) injection, nitroGLYCERIN

## 2022-02-21 ENCOUNTER — Inpatient Hospital Stay (HOSPITAL_COMMUNITY): Payer: Medicare PPO

## 2022-02-21 DIAGNOSIS — U071 COVID-19: Secondary | ICD-10-CM | POA: Diagnosis not present

## 2022-02-21 DIAGNOSIS — J9601 Acute respiratory failure with hypoxia: Secondary | ICD-10-CM | POA: Diagnosis not present

## 2022-02-21 LAB — GLUCOSE, CAPILLARY
Glucose-Capillary: 104 mg/dL — ABNORMAL HIGH (ref 70–99)
Glucose-Capillary: 197 mg/dL — ABNORMAL HIGH (ref 70–99)

## 2022-02-21 LAB — FERRITIN: Ferritin: 1170 ng/mL — ABNORMAL HIGH (ref 11–307)

## 2022-02-21 LAB — D-DIMER, QUANTITATIVE: D-Dimer, Quant: 0.72 ug/mL-FEU — ABNORMAL HIGH (ref 0.00–0.50)

## 2022-02-21 LAB — C-REACTIVE PROTEIN: CRP: 2 mg/dL — ABNORMAL HIGH (ref <1.0)

## 2022-02-21 MED ORDER — DEXAMETHASONE 6 MG PO TABS: 6.0000 mg | ORAL_TABLET | Freq: Every day | ORAL | 0 refills | Status: AC

## 2022-02-21 MED ORDER — DEXAMETHASONE 6 MG PO TABS: 6.0000 mg | ORAL_TABLET | Freq: Every day | ORAL | Status: DC

## 2022-02-21 NOTE — Evaluation (Signed)
Occupational Therapy Evaluation Patient Details Name: Dana Jackson MRN: [id number] DOB: Feb 19, 1945 Today's Date: 02/21/2022   History of Present Illness Patient is a 77 y/o female who presents on 2/8 with chest pain and SOB. Found to have acute hypoxemic respiratory failure secondary to Covid 19 PNA. PMH includes ESRD on HD TTS, lung ca s/p chemo/radiation, NSTEMI, 3-vessel CAD not a candidate for intervention, DM, history of renal cell carcinoma s/p nephrectomy, HTN , history of endometrial cancer treated with hysterectomy and chronic anemia.   Clinical Impression   Dana Jackson was evaluated s/p the above admission list, she is indep at baseline with PRN assist from husband if needed. Upon evaluation she mobilized and completed ADLs at her baseline level - mod I. Noted edema, bruising and a pea-sized mass on the dorsal aspect of pt's L hand; she endorses pain at the area. RN and MD notified. Pt does not have further acute OT needs, will sign off. Recommend d/c home with support of family as needed.      Recommendations for follow up therapy are one component of a multi-disciplinary discharge planning process, led by the attending physician.  Recommendations may be updated based on patient status, additional functional criteria and insurance authorization.   Follow Up Recommendations  No OT follow up     Assistance Recommended at Discharge PRN  Patient can return home with the following A little help with walking and/or transfers;A little help with bathing/dressing/bathroom;Assist for transportation    Functional Status Assessment  Patient has had a recent decline in their functional status and demonstrates the ability to make significant improvements in function in a reasonable and predictable amount of time.  Equipment Recommendations  None recommended by OT       Precautions / Restrictions Precautions Precautions: None Restrictions Weight Bearing Restrictions: No      Mobility Bed  Mobility               General bed mobility comments: OOB in the chair upon arrival    Transfers Overall transfer level: Modified independent                        Balance Overall balance assessment: No apparent balance deficits (not formally assessed)                       ADL either performed or assessed with clinical judgement   ADL Overall ADL's : Modified independent;At baseline               General ADL Comments: pt is at her funcitonal baseline, pt's husband present and assisting as needed.     Vision Baseline Vision/History: 0 No visual deficits Vision Assessment?: No apparent visual deficits     Perception Perception Perception Tested?: No   Praxis Praxis Praxis tested?: Not tested    Pertinent Vitals/Pain Pain Assessment Pain Assessment: No/denies pain     Hand Dominance Right   Extremity/Trunk Assessment Upper Extremity Assessment Upper Extremity Assessment: LUE deficits/detail LUE Deficits / Details: edema, bruising and a pea-sized lump noted on dorsal aspect of hand. pt describes it as being "sore" and does not recall an injury there. RN and MD made aware. LUE Coordination: decreased fine motor   Lower Extremity Assessment Lower Extremity Assessment: Defer to PT evaluation   Cervical / Trunk Assessment Cervical / Trunk Assessment: Kyphotic   Communication Communication Communication: No difficulties   Cognition Arousal/Alertness: Awake/alert Behavior During Therapy: WFL for tasks assessed/performed  Overall Cognitive Status: Within Functional Limits for tasks assessed               General Comments  VSS on RA     Home Living Family/patient expects to be discharged to:: Private residence Living Arrangements: Spouse/significant other Available Help at Discharge: Family;Available PRN/intermittently Type of Home: House Home Access: Stairs to enter [employer] of Steps: flight Entrance  Stairs-Rails: Left Home Layout: Two level;Able to live on main level with bedroom/bathroom     Bathroom Shower/Tub: Occupational psychologist: Handicapped height     Home Equipment: Shower seat;Grab bars - tub/shower;Hand held shower head;Cane - single point;Rolling Walker (2 wheels)   Additional Comments: HD T,TH, Sat      Prior Functioning/Environment Prior Level of Function : Independent/Modified Independent             Mobility Comments: didnt use device, independent, no falls in last 6 months. ADLs Comments: independent        OT Problem List: Decreased activity tolerance         OT Goals(Current goals can be found in the care plan section) Acute Rehab OT Goals Patient Stated Goal: home soon OT Goal Formulation: With patient Time For Goal Achievement: 02/21/22 Potential to Achieve Goals: Good   AM-PAC OT "6 Clicks" Daily Activity     Outcome Measure Help from another person eating meals?: None Help from another person taking care of personal grooming?: None Help from another person toileting, which includes using toliet, bedpan, or urinal?: None Help from another person bathing (including washing, rinsing, drying)?: None Help from another person to put on and taking off regular upper body clothing?: None Help from another person to put on and taking off regular lower body clothing?: None 6 Click Score: 24   End of Session Nurse Communication: Mobility status (painful mass noted on the dorsal aspect of L hand)  Activity Tolerance: Patient tolerated treatment well Patient left: in chair;with call bell/phone within reach  OT Visit Diagnosis: Unsteadiness on feet (R26.81)                Time: [id number] OT Time Calculation (min): 13 min Charges:  OT General Charges $OT Visit: 1 Visit OT Evaluation $OT Eval Low Complexity: [place], OTR/L Acute Rehabilitation Services Office [phone] Secure Chat Communication  Preferred   Elliot Cousin 02/21/2022, 12:26 PM

## 2022-02-21 NOTE — Discharge Summary (Signed)
Physician Discharge Summary  Dana Jackson [id number] DOB: 01-21-1945 DOA: 02/18/2022  PCP: Heywood Bene, PA-C  Admit date: 02/18/2022 Discharge date: 02/21/2022 Recommendations for Outpatient Follow-up:  Follow up with PCP in 1 weeks-call for appointment Recheck blood sugar 3-4 times a day if remains persistently elevated over 200 notify her MD Please obtain BMP/CBC in one week Follow-up with cardiology soon. Cont scheduled dialysis  Discharge Dispo: home Discharge Condition: Stable Code Status:   Code Status: Full Code Diet recommendation:  Diet Order             Diet Carb Modified Fluid consistency: Thin; Room service appropriate? Yes  Diet effective now                    Brief/Interim Summary: 77 y.o.f w/ 3-vessel CAD not a candidate for intervention, diabetes melitis 2,ESRD on HD TTS, history of renal cell carcinoma status post nephrectomy, hypertension, dyslipidemia, history of endometrial cancer treated with hysterectomy and chronic anemia likely related to renal disease presented with shortness of breath post dialysis treatment 2/8, also had chest pain that improved with nitrates.  Husband recently had COVID-56. In the ED patient hypoxic 83 to 86% needing 2 L nasal cannula CTA of the chest was clear without infiltrates and no evidence of PE in the setting of mildly elevated D-dimer 1.58.  Patient was subsequently transferred to [facility] for admission to treat acute hypoxemic respiratory failure in the setting of COVID-19.  EKG unchanged with mild troponin leak in 200- EDP contacted cardiology who reviewed patient's history and recent EKG and labs>she is not a candidate for any intervention due to her underlying CKD on dialysis and increased risk with administration of IV contrast studies.  Also on previous catheterization there were no good targets to perform PTCA or stent placement-advised to monitor, and patient was chest pain-free on admission.  Nephro was  consulted for HD.  Patient underwent dialysis, respite status improved.  She is no longer needing oxygen, she feels improved and is stable for discharge her CRP is downtrending. Will discharge on oral Decadron advised to check blood sugar closely at least 3 times a day  Discharge Diagnoses:  Principal Problem:   Acute hypoxemic respiratory failure due to COVID-19 [facility]) Active Problems:   Essential hypertension   Obesity (BMI 30-39.9)   S/P CABG x 3   Hyperlipidemia associated with type 2 diabetes mellitus ([place])   Type 2 diabetes mellitus with renal manifestations, controlled ([place])   CKD (chronic kidney disease) stage 5, GFR less than 15 ml/min ([facility])   ESRD on hemodialysis ([place])   Acute hypoxic respiratory failure due to COVID infection COVID-19 infection initially hypoxic: Hypoxia resolved, compelte remdesivir 2/11, cont steroid, cont to ambulate, continue pulmonary support.CT angio chest no PE cardiomegaly no acute cardiopulmonary finding noted  ESRD on TTS s/p HD 2/10appreciate nephrology input, volume status stable.  Chest pain:in the setting of COVID infection, with chronically elevated troponin. CAD not a candidate for intervention.  Based on previous cardiology input"there are no good PCI or surgical options.  Patient continues to have refractory angina unresponsive to medical therapy consideration could be given to treatment of the right coronary given it was still a severely diseased vessel on the last cath".  Troponin [id number] downtrending/flat.  No recurrent chest pain since admission.  Continue with home regimen of aspirin, Lipitor, Plavix, Imdur.  EDP had discussed with cardiology at this time no further recommendation-advised to follow-up with cardiology.  Type 2 diabetes mellitus:controlled  with diet . Received ssi while inpatient, HbA1c stable 5.5. Recent Labs  Lab 02/19/22 0713 02/19/22 1152 02/20/22 0850 02/20/22 1131 02/20/22 1717 02/20/22 2015 02/21/22 0858   GLUCAP  --    < > 107* 149* 150* 238* 104*  HGBA1C 5.5  --   --   --   --   --   --    < > = values in this interval not displayed.    Anemia of chronic kidney disease on iron as with HD outpatient monitor hemoglobin Recent Labs  Lab 02/18/22 2111 02/19/22 0713  HGB 10.2* 9.6*  HCT 30.6* 28.4*    Hypertension:BP stable continue metoprolol [id number] statin omega-3 fatty acid. Overweight Patient's Body mass index is 26.69 kg/m. : Will benefit with PCP follow-up, weight loss  healthy lifestyle and outpatient sleep evaluation.  Consults: Npehrology  Subjective: Alert awake resting comfortably on room air, ambulating, would like to go home today  Discharge Exam: Vitals:   02/21/22 0707 02/21/22 0830  BP:  (!) 158/64  Pulse:  77  Resp:  19  Temp:  98.4 F (36.9 C)  SpO2: 95% 97%   General: Pt is alert, awake, not in acute distress Cardiovascular: RRR, S1/S2 +, no rubs, no gallops Respiratory: CTA bilaterally, no wheezing, no rhonchi Abdominal: Soft, NT, ND, bowel sounds + Extremities: no edema, no cyanosis  Discharge Instructions  Discharge Instructions     Discharge instructions   Complete by: As directed    Please call call MD or return to ER for similar or worsening recurring problem that brought you to hospital or if any fever,nausea/vomiting,abdominal pain, uncontrolled pain, chest pain,  shortness of breath or any other alarming symptoms.  Please follow-up your doctor as instructed in a week time and call the office for appointment.  Please avoid alcohol, smoking, or any other illicit substance and maintain healthy habits including taking your regular medications as prescribed.  You were cared for by a hospitalist during your hospital stay. If you have any questions about your discharge medications or the care you received while you were in the hospital after you are discharged, you can call the unit and ask to speak with the hospitalist on call if the  hospitalist that took care of you is not available.  Once you are discharged, your primary care physician will handle any further medical issues. Please note that NO REFILLS for any discharge medications will be authorized once you are discharged, as it is imperative that you return to your primary care physician (or establish a relationship with a primary care physician if you do not have one) for your aftercare needs so that they can reassess your need for medications and monitor your lab values   Increase activity slowly   Complete by: As directed       Allergies as of 02/21/2022       Reactions   Sulfa Antibiotics Other (See Comments)   "AKI"   Erythromycin Itching   Pravastatin Other (See Comments)   Unknown reaction   Simvastatin Other (See Comments)   Reaction not known   Codeine Nausea Only, Other (See Comments)   Tolerate Hycodan   Gemfibrozil Itching        Medication List     TAKE these medications    acetaminophen 500 MG tablet Commonly known as: TYLENOL Take 1,000 mg by mouth every 6 (six) hours as needed for moderate pain.   albuterol 108 (90 Base) MCG/ACT inhaler Commonly known as: VENTOLIN HFA  Inhale 1-2 puffs into the lungs every 4 (four) hours as needed for wheezing or shortness of breath.   Align 4 MG Caps Take 4 mg by mouth daily.   Alpha-Lipoic Acid 600 MG Caps Take 600 mg by mouth daily.   aspirin 81 MG tablet Take 81 mg by mouth at bedtime.   atorvastatin 80 MG tablet Commonly known as: LIPITOR Take 1 tablet (80 mg total) by mouth at bedtime.   benzonatate 100 MG capsule Commonly known as: TESSALON Take 100-200 mg by mouth 3 (three) times daily as needed for cough.   cinacalcet 30 MG tablet Commonly known as: SENSIPAR Take 30 mg by mouth daily with breakfast.   clopidogrel 75 MG tablet Commonly known as: PLAVIX Take 1 tablet (75 mg total) by mouth daily.   Darbepoetin Alfa 60 MCG/0.3ML Sosy injection Commonly known as:  ARANESP Inject 0.3 mLs (60 mcg total) into the vein every Tuesday with hemodialysis. What changed:  when to take this additional instructions   dexamethasone 6 MG tablet Commonly known as: DECADRON Take 1 tablet (6 mg total) by mouth daily for 7 days. Start taking on: February 22, 2022   estradiol 0.1 MG/GM vaginal cream Commonly known as: ESTRACE Place 1 Applicatorful vaginally 3 (three) times a week.   ezetimibe 10 MG tablet Commonly known as: ZETIA Take 1 tablet (10 mg total) by mouth daily.   famotidine 20 MG tablet Commonly known as: PEPCID Take 1 tablet (20 mg total) by mouth at bedtime.   Fish Oil 1200 MG Caps Take 2,400 mg by mouth in the morning and at bedtime.   furosemide 20 MG tablet Commonly known as: Lasix Take 20 mg on Tuesdays and Saturdays What changed:  how much to take how to take this when to take this additional instructions   gabapentin 100 MG capsule Commonly known as: NEURONTIN Take 100 mg by mouth 2 (two) times daily.   isosorbide mononitrate 60 MG 24 hr tablet Commonly known as: IMDUR TAKE 1 TABLET BY MOUTH EVERY DAY What changed: when to take this   lidocaine-prilocaine cream Commonly known as: EMLA Apply 1 application topically Every Tuesday,Thursday,and Saturday with dialysis.   loperamide 2 MG tablet Commonly known as: IMODIUM A-D Take 4 mg by mouth as needed for diarrhea or loose stools.   loratadine 10 MG tablet Commonly known as: CLARITIN Take 10 mg by mouth daily.   magnesium oxide 400 MG tablet Commonly known as: MAG-OX Take 400 mg by mouth daily.   metoprolol tartrate 50 MG tablet Commonly known as: LOPRESSOR Take 1 tablet (50 mg total) by mouth 2 (two) times daily.   nitroGLYCERIN 0.4 MG SL tablet Commonly known as: NITROSTAT PLACE 1 TABLET UNDER THE TONGUE EVERY 5 MINUTES AS NEEDED FOR CHEST PAIN. What changed:  how much to take how to take this when to take this reasons to take this additional  instructions   omeprazole 40 MG capsule Commonly known as: PRILOSEC Take 40 mg by mouth daily before breakfast.   promethazine-dextromethorphan 6.25-15 MG/5ML syrup Commonly known as: PROMETHAZINE-DM Take 5 mLs by mouth 4 (four) times daily as needed for cough.   pyridOXINE 100 MG tablet Commonly known as: VITAMIN B6 Take 200 mg by mouth daily.   sevelamer carbonate 800 MG tablet Commonly known as: RENVELA Take 800 mg by mouth See admin instructions. Take 800 mg with each meal and each snack        Follow-up Information     Heywood Bene,  PA-C Follow up.   Specialty: Physician Assistant Contact information: [address] [place] [address] [place], [facility] Follow up.   Why: Due to being covid positive, please call clinic when you arrive and remain in your car until staff come to get you.  Staff will provide a N95. Contact information: [place] [address] [phone]                Allergies  Allergen Reactions   Sulfa Antibiotics Other (See Comments)    "AKI"    Erythromycin Itching   Pravastatin Other (See Comments)    Unknown reaction   Simvastatin Other (See Comments)    Reaction not known   Codeine Nausea Only and Other (See Comments)    Tolerate Hycodan   Gemfibrozil Itching    The results of significant diagnostics from this hospitalization (including imaging, microbiology, ancillary and laboratory) are listed below for reference.    Microbiology: Recent Results (from the past 240 hour(s))  Resp panel by RT-PCR (RSV, Flu A&B, Covid) Nasopharyngeal Swab     Status: Abnormal   Collection Time: 02/18/22  9:11 PM   Specimen: Nasopharyngeal Swab; Nasal Swab  Result Value Ref Range Status   SARS Coronavirus 2 by RT PCR POSITIVE (A) NEGATIVE Final    Comment: (NOTE) SARS-CoV-2 target nucleic acids are DETECTED.  The SARS-CoV-2 RNA is generally detectable in  upper respiratory specimens during the acute phase of infection. Positive results are indicative of the presence of the identified virus, but do not rule out bacterial infection or co-infection with other pathogens not detected by the test. Clinical correlation with patient history and other diagnostic information is necessary to determine patient infection status. The expected result is Negative.  Fact Sheet for Patients: EntrepreneurPulse.com.au  Fact Sheet for Healthcare Providers: IncredibleEmployment.be  This test is not yet approved or cleared by the [place] FDA and  has been authorized for detection and/or diagnosis of SARS-CoV-2 by FDA under an Emergency Use Authorization (EUA).  This EUA will remain in effect (meaning this test can be used) for the duration of  the COVID-19 declaration under Section 564(b)(1) of the A ct, 21 U.S.C. section 360bbb-3(b)(1), unless the authorization is terminated or revoked sooner.     Influenza A by PCR NEGATIVE NEGATIVE Final   Influenza B by PCR NEGATIVE NEGATIVE Final    Comment: (NOTE) The Xpert Xpress SARS-CoV-2/FLU/RSV plus assay is intended as an aid in the diagnosis of influenza from Nasopharyngeal swab specimens and should not be used as a sole basis for treatment. Nasal washings and aspirates are unacceptable for Xpert Xpress SARS-CoV-2/FLU/RSV testing.  Fact Sheet for Patients: EntrepreneurPulse.com.au  Fact Sheet for Healthcare Providers: IncredibleEmployment.be  This test is not yet approved or cleared by the [place] FDA and has been authorized for detection and/or diagnosis of SARS-CoV-2 by FDA under an Emergency Use Authorization (EUA). This EUA will remain in effect (meaning this test can be used) for the duration of the COVID-19 declaration under Section 564(b)(1) of the Act, 21 U.S.C. section 360bbb-3(b)(1), unless the authorization is  terminated or revoked.     Resp Syncytial Virus by PCR NEGATIVE NEGATIVE Final    Comment: (NOTE) Fact Sheet for Patients: EntrepreneurPulse.com.au  Fact Sheet for Healthcare Providers: IncredibleEmployment.be  This test is not yet approved or cleared by the [place] FDA and has been authorized for detection and/or diagnosis of SARS-CoV-2  by FDA under an Emergency Use Authorization (EUA). This EUA will remain in effect (meaning this test can be used) for the duration of the COVID-19 declaration under Section 564(b)(1) of the Act, 21 U.S.C. section 360bbb-3(b)(1), unless the authorization is terminated or revoked.  Performed at [facility], [address] [place], [place] [address]     Procedures/Studies: CT Angio Chest PE W and/or Wo Contrast  Result Date: 02/18/2022 CLINICAL DATA:  Pulmonary embolism (PE) suspected, low to intermediate prob, positive D-dimer. Shortness of breath EXAM: CT ANGIOGRAPHY CHEST WITH CONTRAST TECHNIQUE: Multidetector CT imaging of the chest was performed using the standard protocol during bolus administration of intravenous contrast. Multiplanar CT image reconstructions and MIPs were obtained to evaluate the vascular anatomy. RADIATION DOSE REDUCTION: This exam was performed according to the departmental dose-optimization program which includes automated exposure control, adjustment of the mA and/or kV according to patient size and/or use of iterative reconstruction technique. CONTRAST:  9m OMNIPAQUE IOHEXOL 350 MG/ML SOLN COMPARISON:  10/19/2021 FINDINGS: Cardiovascular: Prior CABG. Cardiomegaly. No evidence of aortic aneurysm. Aortic atherosclerosis. No filling defects in the pulmonary arteries to suggest pulmonary emboli. Mediastinum/Nodes: No mediastinal, hilar, or axillary adenopathy. Trachea and esophagus are unremarkable. Thyroid unremarkable. Lungs/Pleura: Platelike scarring in the left lower  lobe. No confluent opacities or effusions. Upper Abdomen: No acute findings Musculoskeletal: Chest wall soft tissues are unremarkable. No acute bony abnormality. Review of the MIP images confirms the above findings. IMPRESSION: No evidence of pulmonary embolus. Cardiomegaly. No acute cardiopulmonary disease. Aortic Atherosclerosis (ICD10-I70.0). Electronically Signed   By: KRolm BaptiseM.D.   On: 02/18/2022 23:59   DG Chest 2 View  Result Date: 02/18/2022 CLINICAL DATA:  Chest pain short of breath EXAM: CHEST - 2 VIEW COMPARISON:  11/11/2021 FINDINGS: Post sternotomy changes. Hardware in the cervical spine. Minimal atelectasis or scarring in the left lower lung. No acute airspace disease, pleural effusion or pneumothorax. Stable cardiomediastinal silhouette with aortic atherosclerosis. IMPRESSION: No active cardiopulmonary disease. Minimal atelectasis or scarring in the left lower lung. Electronically Signed   By: KDonavan FoilM.D.   On: 02/18/2022 21:52    Labs: BNP (last 3 results) Recent Labs    10/13/21 0027 10/14/21 0216 10/19/21 0617  BNP 898.8* 1,267.0* [id number]   Basic Metabolic Panel: Recent Labs  Lab 02/18/22 2111 02/19/22 0713 02/20/22 0709  NA 139 137 135  K 3.7 3.8 4.2  CL 94* 95* 96*  CO2 34* 31 28  GLUCOSE 163* 105* 125*  BUN 12 15 32*  CREATININE 2.42* 2.93* 4.20*  CALCIUM 8.6* 8.6* 8.8*  MG  --  2.2  --   PHOS  --  3.8 3.6   Liver Function Tests: Recent Labs  Lab 02/19/22 0713 02/20/22 0709  AST 24  --   ALT 20  --   ALKPHOS 136*  --   BILITOT 0.8  --   PROT 6.1*  --   ALBUMIN 2.9* 2.9*   No results for input(s): "LIPASE", "AMYLASE" in the last 168 hours. No results for input(s): "AMMONIA" in the last 168 hours. CBC: Recent Labs  Lab 02/18/22 2111 02/19/22 0713  WBC 6.2 5.8  NEUTROABS  --  3.9  HGB 10.2* 9.6*  HCT 30.6* 28.4*  MCV 103.7* 104.0*  PLT 197 194   Cardiac Enzymes: Recent Labs  Lab 02/19/22 0713  CKTOTAL 56   BNP: Invalid  input(s): "POCBNP" CBG: Recent Labs  Lab 02/20/22 0850 02/20/22 1131 02/20/22 1717 02/20/22 2015 02/21/22 0858  GLUCAP  107* 149* 150* 238* 104*   D-Dimer Recent Labs    02/20/22 0404 02/21/22 0324  DDIMER 0.65* 0.72*   Hgb A1c Recent Labs    02/19/22 0713  HGBA1C 5.5   Lipid Profile Recent Labs    02/19/22 0713  CHOL 115  HDL 30*  LDLCALC 59  TRIG 132  CHOLHDL 3.8   Thyroid function studies No results for input(s): "TSH", "T4TOTAL", "T3FREE", "THYROIDAB" in the last 72 hours.  Invalid input(s): "FREET3" Anemia work up Recent Labs    02/20/22 0404 02/21/22 0324  FERRITIN 1,152* 1,170*   Urinalysis    Component Value Date/Time   COLORURINE AMBER (A) 10/19/2021 0645   APPEARANCEUR CLOUDY (A) 10/19/2021 0645   LABSPEC 1.009 10/19/2021 0645   PHURINE 8.0 10/19/2021 0645   GLUCOSEU NEGATIVE 10/19/2021 0645   HGBUR MODERATE (A) 10/19/2021 0645   BILIRUBINUR NEGATIVE 10/19/2021 0645   KETONESUR NEGATIVE 10/19/2021 0645   PROTEINUR 100 (A) 10/19/2021 0645   UROBILINOGEN 0.2 06/10/2014 1259   NITRITE NEGATIVE 10/19/2021 0645   LEUKOCYTESUR LARGE (A) 10/19/2021 0645   Sepsis Labs Recent Labs  Lab 02/18/22 2111 02/19/22 0713  WBC 6.2 5.8   Microbiology Recent Results (from the past 240 hour(s))  Resp panel by RT-PCR (RSV, Flu A&B, Covid) Nasopharyngeal Swab     Status: Abnormal   Collection Time: 02/18/22  9:11 PM   Specimen: Nasopharyngeal Swab; Nasal Swab  Result Value Ref Range Status   SARS Coronavirus 2 by RT PCR POSITIVE (A) NEGATIVE Final    Comment: (NOTE) SARS-CoV-2 target nucleic acids are DETECTED.  The SARS-CoV-2 RNA is generally detectable in upper respiratory specimens during the acute phase of infection. Positive results are indicative of the presence of the identified virus, but do not rule out bacterial infection or co-infection with other pathogens not detected by the test. Clinical correlation with patient history and other  diagnostic information is necessary to determine patient infection status. The expected result is Negative.  Fact Sheet for Patients: EntrepreneurPulse.com.au  Fact Sheet for Healthcare Providers: IncredibleEmployment.be  This test is not yet approved or cleared by the [place] FDA and  has been authorized for detection and/or diagnosis of SARS-CoV-2 by FDA under an Emergency Use Authorization (EUA).  This EUA will remain in effect (meaning this test can be used) for the duration of  the COVID-19 declaration under Section 564(b)(1) of the A ct, 21 U.S.C. section 360bbb-3(b)(1), unless the authorization is terminated or revoked sooner.     Influenza A by PCR NEGATIVE NEGATIVE Final   Influenza B by PCR NEGATIVE NEGATIVE Final    Comment: (NOTE) The Xpert Xpress SARS-CoV-2/FLU/RSV plus assay is intended as an aid in the diagnosis of influenza from Nasopharyngeal swab specimens and should not be used as a sole basis for treatment. Nasal washings and aspirates are unacceptable for Xpert Xpress SARS-CoV-2/FLU/RSV testing.  Fact Sheet for Patients: EntrepreneurPulse.com.au  Fact Sheet for Healthcare Providers: IncredibleEmployment.be  This test is not yet approved or cleared by the [place] FDA and has been authorized for detection and/or diagnosis of SARS-CoV-2 by FDA under an Emergency Use Authorization (EUA). This EUA will remain in effect (meaning this test can be used) for the duration of the COVID-19 declaration under Section 564(b)(1) of the Act, 21 U.S.C. section 360bbb-3(b)(1), unless the authorization is terminated or revoked.     Resp Syncytial Virus by PCR NEGATIVE NEGATIVE Final    Comment: (NOTE) Fact Sheet for Patients: EntrepreneurPulse.com.au  Fact Sheet for Healthcare  Providers: IncredibleEmployment.be  This test is not yet approved or  cleared by the [place] and has been authorized for detection and/or diagnosis of SARS-CoV-2 by FDA under an Emergency Use Authorization (EUA). This EUA will remain in effect (meaning this test can be used) for the duration of the COVID-19 declaration under Section 564(b)(1) of the Act, 21 U.S.C. section 360bbb-3(b)(1), unless the authorization is terminated or revoked.  Performed at [facility], [address] [place], [place] [address]      Time coordinating discharge: 25 minutes  SIGNED: Antonieta Pert, MD  [facility] 02/21/2022, 11:30 AM  If 7PM-7AM, please contact night-coverage [email or web address]

## 2022-02-21 NOTE — Evaluation (Signed)
Physical Therapy Evaluation Patient Details Name: Dana Jackson MRN: [id number] DOB: 1945-08-26 Today's Date: 02/21/2022  History of Present Illness  Patient is a 77 y/o female who presents on 2/8 with chest pain and SOB. Found to have acute hypoxemic respiratory failure secondary to Covid 19 PNA. PMH includes ESRD on HD TTS, lung ca s/p chemo/radiation, NSTEMI, 3-vessel CAD not a candidate for intervention, DM, history of renal cell carcinoma s/p nephrectomy, HTN , history of endometrial cancer treated with hysterectomy and chronic anemia.  Clinical Impression  Patient is independent with ADLs/IADLs at baseline and lives with her spouse. Today, pt tolerated transfers, gait training and stair training supervision-mod I for safety. Required 1 seated rest break post stairs due to 2-3/4 DOE. Sp02 remained >91% on RA throughout activity. Education re: energy conservation techniques. Encouraged to keep mobilizing while in the hospital. Pt functioning at Mod I level and does not require further skilled therapy services. All education completed. Discharge from therapy.       Recommendations for follow up therapy are one component of a multi-disciplinary discharge planning process, led by the attending physician.  Recommendations may be updated based on patient status, additional functional criteria and insurance authorization.  Follow Up Recommendations No PT follow up      Assistance Recommended at Discharge PRN  Patient can return home with the following  Assist for transportation    Equipment Recommendations None recommended by PT  Recommendations for Other Services       Functional Status Assessment Patient has had a recent decline in their functional status and demonstrates the ability to make significant improvements in function in a reasonable and predictable amount of time.     Precautions / Restrictions Precautions Precautions: None Restrictions Weight Bearing Restrictions: No       Mobility  Bed Mobility               General bed mobility comments: Sitting EOB upon PT arrival.    Transfers Overall transfer level: Modified independent Equipment used: None               General transfer comment: Stood from EOB without difficulty.    Ambulation/Gait Ambulation/Gait assistance: Supervision, Modified independent (Device/Increase time) Gait Distance (Feet): 150 Feet (+ 175') Assistive device: None Gait Pattern/deviations: Step-through pattern, Decreased stride length   Gait velocity interpretation: 1.31 - 2.62 ft/sec, indicative of limited community ambulator   General Gait Details: Slow, steady gait with 1 seated rest break post stairs,. 2-3/4 DOE. SP02 remained >91% on RA.  Stairs Stairs: Yes Stairs assistance: Supervision Stair Management: One rail Left, Alternating pattern Number of Stairs: 7 General stair comments: Cues for technique/safety.  Wheelchair Mobility    Modified Rankin (Stroke Patients Only)       Balance Overall balance assessment: Needs assistance Sitting-balance support: Feet supported, No upper extremity supported Sitting balance-Leahy Scale: Good     Standing balance support: During functional activity Standing balance-Leahy Scale: Good                               Pertinent Vitals/Pain Pain Assessment Pain Assessment: No/denies pain    Home Living Family/patient expects to be discharged to:: Private residence Living Arrangements: Spouse/significant other Available Help at Discharge: Family;Available PRN/intermittently (works some) Type of Home: House Home Access: Stairs to enter Entrance Stairs-Rails: Left Entrance Stairs-Number of Steps: flight   Home Layout: Two level;Able to live on main level with bedroom/bathroom Home  Equipment: Shower seat;Grab bars - tub/shower;Hand held shower head;Cane - single point;Rolling Walker (2 wheels) Additional Comments: HD T,TH, Sat    Prior  Function Prior Level of Function : Independent/Modified Independent             Mobility Comments: didnt use device, independent, no falls in last 6 months. ADLs Comments: independent     Hand Dominance   Dominant Hand: Right    Extremity/Trunk Assessment   Upper Extremity Assessment Upper Extremity Assessment: Defer to OT evaluation    Lower Extremity Assessment Lower Extremity Assessment: Overall WFL for tasks assessed    Cervical / Trunk Assessment Cervical / Trunk Assessment: Kyphotic  Communication   Communication: No difficulties  Cognition Arousal/Alertness: Awake/alert Behavior During Therapy: WFL for tasks assessed/performed Overall Cognitive Status: Within Functional Limits for tasks assessed                                          General Comments General comments (skin integrity, edema, etc.): Sp02 remained >91% on RA throughout activity.    Exercises     Assessment/Plan    PT Assessment Patient does not need any further PT services  PT Problem List         PT Treatment Interventions      PT Goals (Current goals can be found in the Care Plan section)  Acute Rehab PT Goals Patient Stated Goal: to go home PT Goal Formulation: All assessment and education complete, DC therapy    Frequency       Co-evaluation               AM-PAC PT "6 Clicks" Mobility  Outcome Measure Help needed turning from your back to your side while in a flat bed without using bedrails?: None Help needed moving from lying on your back to sitting on the side of a flat bed without using bedrails?: None Help needed moving to and from a bed to a chair (including a wheelchair)?: A Little Help needed standing up from a chair using your arms (e.g., wheelchair or bedside chair)?: A Little Help needed to walk in hospital room?: A Little Help needed climbing 3-5 steps with a railing? : A Little 6 Click Score: 20    End of Session Equipment Utilized  During Treatment: Gait belt Activity Tolerance: Patient tolerated treatment well Patient left: in bed;with call bell/phone within reach Nurse Communication: Mobility status      Time: [id number] PT Time Calculation (min) (ACUTE ONLY): 18 min   Charges:   PT Evaluation $PT Eval Low Complexity: 1 Low          Marisa Severin, PT, DPT Acute Rehabilitation Services Secure chat preferred Office [phone]     Marguarite Arbour A Barnhart 02/21/2022, 8:50 AM

## 2022-02-22 NOTE — TOC Transition Note (Signed)
Transition of care contact from inpatient facility  Date of discharge: 02/21/22 Date of contact: 02/22/22 Method: Phone Spoke to: Patient's Husband  Patient's husband contacted to discuss transition of care from recent inpatient hospitalization. Patient was admitted to [facility] from 02/18/22-02/21/22 with discharge diagnosis of hypoxic respiratory failure 2nd COVID-19.  Medication changes were reviewed.  Patient will follow up with her outpatient HD unit on Tuesday 02/23/22 at [facility].  Tobie Poet, NP

## 2022-02-22 NOTE — Progress Notes (Signed)
Late Entry Note  Pt was d/c to home yesterday. Contacted [facility] this morning to advise staff of pt's d/c and that pt should resume care tomorrow.   Melven Sartorius Renal Navigator [phone]

## 2022-02-23 LAB — HEPATITIS B SURFACE ANTIBODY, QUANTITATIVE: Hep B S AB Quant (Post): 7.6 m[IU]/mL — ABNORMAL LOW (ref >9.9)

## 2022-03-11 NOTE — Progress Notes (Deleted)
Office Visit    Patient Name: Dana Jackson Date of Encounter: 03/11/2022  Primary Care Provider:  Heywood Bene, PA-C Primary Cardiologist:  Glenetta Hew, MD  Chief Complaint    77 year old female with a history of CAD, hypertension, hyperlipidemia, ESRD on HD, TIA, renal cell carcinoma s/p partial left nephrectomy, lung cancer s/p radiation therapy, and type 2 diabetes who presents for hospital follow-up related to CAD s/p NSTEMI.   Past Medical History    Past Medical History:  Diagnosis Date   Anemia    Arthritis    hands   Asthma    childhood   Bacteremia due to Escherichia coli 06/2013   Currently being treated with anti-biotics   CAD in native artery 12/2010   a) Cath for exertional angina & EKG changes: 40% LM, 80% mid LAD.  95% ost cX, 80-90% PDA --> CABG X3; b) Post CABG CATH for + Myoview with basal anterior ischemia -> Ost LAD 80% (diffuse) then 100% after SP2, RI 70% (too small for PCI), Ost-prox Cx 99% & OM1 90%, OstrPDA 80% (small); Occluded SVG-rPDA.  Patent LIMA-dLAD, SVG-OM: culprit ~ p-m LAD pre-LIMA & RI - not good PCI target --> Med Rx   CKD (chronic kidney disease) stage 3, GFR 30-59 ml/min (HCC)    Degenerative disc disease, cervical    Degenerative disc disease, cervical [722.4]   Diabetes mellitus without complication (HCC)    Type II   Dyspnea    Endometrial cancer ([place]) 11/2013   Treated with TAH with pelvic lymphadenectomy followed by radiation and chemotherapy   GERD (gastroesophageal reflux disease)    Hearing loss    bilateral - no hearing aids   Heart murmur    History of asthma     childhood   History of blood transfusion    History of pneumonia    "2-3 times"   History of unstable angina November/December 2012   T wave inversions in inferolateral leads.  No stress test performed.   Hyperlipidemia LDL goal <70    Hypertension, essential, benign    Neuropathy    hands   Pneumonia 2018   Renal cell carcinoma ([place]) 11/18/2008    T2aNx s/p partial left nephrectomy   S/P CABG x 3 12/2010   LIMA-LAD, SVG RPL, SVG-Circumflex   Stroke ([place])    TIA- no residual    TIA (transient ischemic attack) 1992 &  2010   Past Surgical History:  Procedure Laterality Date   ANTERIOR CERVICAL DECOMP/DISCECTOMY FUSION  10/11/2005   multi-level   AV FISTULA PLACEMENT Left 08/03/2017   Procedure: ARTERIOVENOUS (AV) FISTULA CREATION LEFT ARM;  Surgeon: Rosetta Posner, MD;  Location: [facility];  Service: Vascular;  Laterality: Left;   BRONCHIAL BIOPSY  12/30/2020   Procedure: BRONCHIAL BIOPSIES;  Surgeon: Garner Nash, DO;  Location: [facility];  Service: Pulmonary;;   BRONCHIAL BIOPSY  02/03/2021   Procedure: BRONCHIAL BIOPSIES;  Surgeon: Garner Nash, DO;  Location: [facility];  Service: Pulmonary;;   BRONCHIAL BRUSHINGS  12/30/2020   Procedure: BRONCHIAL BRUSHINGS;  Surgeon: Garner Nash, DO;  Location: [facility];  Service: Pulmonary;;   BRONCHIAL BRUSHINGS  02/03/2021   Procedure: BRONCHIAL BRUSHINGS;  Surgeon: Garner Nash, DO;  Location: [facility];  Service: Pulmonary;;   BRONCHIAL NEEDLE ASPIRATION BIOPSY  12/30/2020   Procedure: BRONCHIAL NEEDLE ASPIRATION BIOPSIES;  Surgeon: Garner Nash, DO;  Location: [facility];  Service: Pulmonary;;   BRONCHIAL NEEDLE ASPIRATION BIOPSY  02/03/2021  Procedure: BRONCHIAL NEEDLE ASPIRATION BIOPSIES;  Surgeon: Garner Nash, DO;  Location: [facility];  Service: Pulmonary;;   CARDIAC CATHETERIZATION  12/24/10   40% left main, 80% mid LAD, 95% ostial circumflex, 80-90% PDA.   CARDIAC CATHETERIZATION N/A 07/01/2014   Procedure: Left Heart Cath and Cors/Grafts Angiography;  Surgeon: Leonie Man, MD;  Location: [facility]:  For Abn Nuc @ [facility]: Ost LAD 80%, mid LAD 100% after S/P 2, 70% RI (no PCI target), ost-prox Cx 99%, OM1 90%. Ost rPDA 80% (~ pre-CABG), occluded SVG-rPDA, patent LIMA-dLAD, SVG OM; potential culprit for abn Nuc scan = pLAD Dz, small RI 70%  or PDA -> med Rx   CAROTID ENDARTERECTOMY Left    CHOLECYSTECTOMY  ~ [place] GRAFT  12/25/2010   Procedure: CORONARY ARTERY BYPASS GRAFTING (CABGX3 - LIMA-LAD, SVG RPL, SVG-Circumflex);  Surgeon: Rexene Alberts, MD;  Location: [facility];  Service: Open Heart Surgery;  Laterality: N/A;   DILATATION & CURETTAGE/HYSTEROSCOPY WITH MYOSURE N/A 11/02/2013   Procedure: DILATATION & CURETTAGE/HYSTEROSCOPY WITH MYOSURE ABLATION;  Surgeon: Allena Katz, MD;  Location: [facility];  Service: Gynecology;  Laterality: N/A;   DOPPLER ECHOCARDIOGRAPHY  12/08/2010   EF =>55%,MILD CONCENTRIC LEFT VENTRICULAR HYPERTROPHY   EYE SURGERY Bilateral    Cataract   IR DIALY SHUNT INTRO NEEDLE/INTRACATH INITIAL W/IMG LEFT Left 04/24/2019   IR FLUORO GUIDE CV LINE LEFT  04/26/2019   IR US GUIDE VASC ACCESS LEFT  04/24/2019   IR US GUIDE VASC ACCESS LEFT  04/26/2019   LEFT HEART CATH AND CORS/GRAFTS ANGIOGRAPHY N/A 10/13/2021   Procedure: LEFT HEART CATH AND CORS/GRAFTS ANGIOGRAPHY;  Surgeon: Belva Crome, MD;  Location: [facility];  Service: Cardiovascular;  Laterality: N/A;   LEFT HEART CATHETERIZATION WITH CORONARY ANGIOGRAM N/A 12/24/2010   Procedure: LEFT HEART CATHETERIZATION WITH CORONARY ANGIOGRAM;  Surgeon: Leonie Man, MD;  Location: [facility] C[facility];  Service: Cardiovascular;  Laterality: N/A;   NM MYOCAR SINGLE W/SPECT  07/26/2007   EF 79%, LEFT VENT.FUNCTION NORMAL   PARTIAL NEPHRECTOMY Left 11/18/2008   left partial nephrectomy for renal cell CA   PET Myocardial Perfusion Scan  06/13/2014   At [facility]: Moderate size, mild severity completely reversible defect involving the basal anterior, mid anterior and apical anterior segments consistent with ischemia. EF 65% with normal global function.   TONSILLECTOMY     "in college"   TOTAL ABDOMINAL HYSTERECTOMY  November 2015    At [facility]: Robotic procedure with pelvic lymphadenectomy   TRANSTHORACIC  ECHOCARDIOGRAM  06/13/2014   At [facility]: EF 60-65%. Grade 1 diastolic dysfunction. Mild MR. Aortic sclerosis. Moderate pulmonary hypertension   TUBAL LIGATION  ~ 1984   UPPER GI ENDOSCOPY     VIDEO BRONCHOSCOPY WITH RADIAL ENDOBRONCHIAL ULTRASOUND  12/30/2020   Procedure: RADIAL ENDOBRONCHIAL ULTRASOUND;  Surgeon: Garner Nash, DO;  Location: [place];  Service: Pulmonary;;   VIDEO BRONCHOSCOPY WITH RADIAL ENDOBRONCHIAL ULTRASOUND  02/03/2021   Procedure: RADIAL ENDOBRONCHIAL ULTRASOUND;  Surgeon: Garner Nash, DO;  Location: [facility];  Service: Pulmonary;;   WISDOM TOOTH EXTRACTION      Allergies  Allergies  Allergen Reactions   Sulfa Antibiotics Other (See Comments)    "AKI"    Erythromycin Itching   Pravastatin Other (See Comments)    Unknown reaction   Simvastatin Other (See Comments)    Reaction not known   Codeine  Nausea Only and Other (See Comments)    Tolerate Hycodan   Gemfibrozil Itching     Labs/Other Studies Reviewed    The following studies were reviewed today: ***  Recent Labs: 10/19/2021: B Natriuretic Peptide 639.1 02/19/2022: ALT 20; Hemoglobin 9.6; Magnesium 2.2; Platelets 194 02/20/2022: BUN 32; Creatinine, Ser 4.20; Potassium 4.2; Sodium 135  Recent Lipid Panel    Component Value Date/Time   CHOL 115 02/19/2022 0713   CHOL 170 11/09/2021 0944   TRIG 132 02/19/2022 0713   HDL 30 (L) 02/19/2022 0713   HDL 36 (L) 11/09/2021 0944   CHOLHDL 3.8 02/19/2022 0713   VLDL 26 02/19/2022 0713   LDLCALC 59 02/19/2022 0713   LDLCALC 102 (H) 11/09/2021 0944    History of Present Illness    77 year old female with the above past medical history including CAD, hypertension, hyperlipidemia, ESRD on HD, TIA, renal cell carcinoma s/p partial left nephrectomy, lung cancer s/p radiation therapy, and type 2 diabetes who presents for hospital follow-up related to CAD s/p NSTEMI.   She has a history of CAD s/p CABG x3 (LIMA-LAD, SVG, RPL,  SVG-Circumflex) in 2012.  She had an abnormal cardiac PET stress test in 2016.  Cardiac catheterization in June 2016 revealed occluded SVG with severe ostial PDA disease, moderate mid RCA disease with stable flow to the post anterolateral system, patent LIMA to diffusely diseased LAD and patent vein graft to OM1 with moderate disease in the AV groove circumflex lesion OM 2.  She presented to the ED on 10/13/2021 with chest pain, shortness of breath.  EKG showed ST depression, troponin was elevated at greater than 1400.  Cardiology was consulted. 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 She underwent cardiac catheterization on 10/13/2021 which revealed new total occlusion of saphenous vein graft to the obtuse marginal, chronic total occlusion of saphenous vein graft to PDA, patent LIMA to the apical LAD which was totaled proximally and distal to the insertion site, 70% left main, 70% followed by 80% proximal and mid LAD, 99% ostial circumflex, 70 to 80% right coronary artery, 95% PDA and normal LV function.  Medical therapy recommended.  It was noted that if symptoms persisted, PCI of the RCA could be considered.  She was discharged home in stable condition on 10/14/2021.  Unfortunately, she returned to the ED on 10/19/2021 with fever, acute hypoxic respiratory failure in the setting of septic shock due to pneumonia.  She was admitted to the ICU and required pressors/empiric antibiotics.  MBS per speech therapy which showed no evidence of aspiration.  CT showed stable bilateral pulmonary nodules, incidental finding of 1.7 cystic lesion in the body of pancreas.  Follow-up MRI was recommended in 6 months.  She was discharged home in stable condition on 10/23/2021.  She was last seen in the office on 11/09/2021 and was stable from a cardiac standpoint.  She denied symptoms concerning for angina.   She was  hospitalized in 02/2022 in the setting of shortness of breath following dialysis treatment.  She also had chest pain improved with nitrates.  CT of the chest was negative for PE.  She was hypoxic with SpO2 in the 80s requiring O2 supplementation.  She was transferred to [facility] for admission to treat acute hypoxic respiratory failure in the setting of COVID-19.  EKG was stable with mild troponin leak.  Cardiology reviewed patient's case and deemed she was  not a candidate for intervention due to underlying ESRD on HD.  Respiratory status improved with dialysis. Troponin chronically elevated.  She presents today for follow-up accompanied by her husband.  Since her hospitalization she has done well from a cardiac standpoint. She denies any chest pain, dyspnea, PND, orthopnea. She does note some intermittent bilateral lower extremity edema, she states her Lasix was held during her recent hospitalization.  She is asking if this can be resumed today.  Otherwise, she reports feeling well and denies any additional concerns today.  1. CAD: S/p CABG x3 (LIMA-LAD, SVG, RPL, SVG-Circumflex) in 2012. Most recent cath in the setting of NSTEMI on 10/13/2021 revealed new total occlusion of saphenous vein graft to the obtuse marginal, chronic total occlusion of saphenous vein graft to PDA, patent LIMA to the apical LAD which was totaled proximally and distal to the insertion site, 70% left main, 70% followed by 80% proximal and mid LAD, 99% ostial circumflex, 70 to 80% right coronary artery, 95% PDA and normal LV function. Medical therapy recommended. It was noted that if symptoms persisted, PCI of the RCA could be considered. Stable with no anginal symptoms. Continue aspirin, Plavix, Lipitor.    2. Bilateral lower extremity edema: Lasix was held during recent hospitalization.  She has noted some intermittent lower extremity edema since this time.  BP stable, can resume Lasix 20 mg on Tuesday and Saturday evenings.   Recommend she confirm this with nephrology.   3. Hypertension: BP well controlled. Continue current antihypertensive regimen.    4. Hyperlipidemia: LDL was 151 in 05/2021. Monitored and managed per PCP.  Will repeat lipids, LFTs.  Continue aspirin, Plavix, Lipitor.      5. ESRD: On HD TTS. Follows with nephrology.    6. History of TIA: No recurrence. Continue aspirin, Plavix, Lipitor.   7. Type 2 diabetes: A1c was 5.1 in 10/2021.  Monitored and managed per PCP.     8. Pneumonia: Recently admitted to the ICU with septic shock in the setting of pneumonia.  MBS no concern for aspiration.  Appears to be resolved.   9. Disposition: Follow-up in  Home Medications    Current Outpatient Medications  Medication Sig Dispense Refill   acetaminophen (TYLENOL) 500 MG tablet Take 1,000 mg by mouth every 6 (six) hours as needed for moderate pain.     albuterol (VENTOLIN HFA) 108 (90 Base) MCG/ACT inhaler Inhale 1-2 puffs into the lungs every 4 (four) hours as needed for wheezing or shortness of breath.     Alpha-Lipoic Acid 600 MG CAPS Take 600 mg by mouth daily.     aspirin 81 MG tablet Take 81 mg by mouth at bedtime.     atorvastatin (LIPITOR) 80 MG tablet Take 1 tablet (80 mg total) by mouth at bedtime. 90 tablet 0   benzonatate (TESSALON) 100 MG capsule Take 100-200 mg by mouth 3 (three) times daily as needed for cough.     cinacalcet (SENSIPAR) 30 MG tablet Take 30 mg by mouth daily with breakfast.     clopidogrel (PLAVIX) 75 MG tablet Take 1 tablet (75 mg total) by mouth daily. 90 tablet 3   Darbepoetin Alfa (ARANESP) 60 MCG/0.3ML SOSY injection Inject 0.3 mLs (60 mcg total) into the vein every Tuesday with hemodialysis. (Patient taking differently: Inject 60 mcg into the vein See admin instructions. Given at dialysis) 4.2 mL    estradiol (ESTRACE) 0.1 MG/GM vaginal cream Place 1 Applicatorful vaginally 3 (three) times a week.  ezetimibe (ZETIA) 10 MG tablet Take 1 tablet (10 mg total) by  mouth daily. 90 tablet 1   famotidine (PEPCID) 20 MG tablet Take 1 tablet (20 mg total) by mouth at bedtime. 30 tablet 2   furosemide (LASIX) 20 MG tablet Take 20 mg on Tuesdays and Saturdays (Patient taking differently: Take 20 mg by mouth See admin instructions. Tuesdays and Saturdays) 30 tablet 3   gabapentin (NEURONTIN) 100 MG capsule Take 100 mg by mouth 2 (two) times daily.     isosorbide mononitrate (IMDUR) 60 MG 24 hr tablet TAKE 1 TABLET BY MOUTH EVERY DAY (Patient taking differently: Take 60 mg by mouth at bedtime.) 90 tablet 3   lidocaine-prilocaine (EMLA) cream Apply 1 application topically Every Tuesday,Thursday,and Saturday with dialysis.     loperamide (IMODIUM A-D) 2 MG tablet Take 4 mg by mouth as needed for diarrhea or loose stools.     loratadine (CLARITIN) 10 MG tablet Take 10 mg by mouth daily.     magnesium oxide (MAG-OX) 400 MG tablet Take 400 mg by mouth daily.     metoprolol tartrate (LOPRESSOR) 50 MG tablet Take 1 tablet (50 mg total) by mouth 2 (two) times daily. 60 tablet 7   nitroGLYCERIN (NITROSTAT) 0.4 MG SL tablet PLACE 1 TABLET UNDER THE TONGUE EVERY 5 MINUTES AS NEEDED FOR CHEST PAIN. (Patient taking differently: Place 0.4 mg under the tongue every 5 (five) minutes as needed for chest pain.) 25 tablet 11   Omega-3 Fatty Acids (FISH OIL) 1200 MG CAPS Take 2,400 mg by mouth in the morning and at bedtime.     omeprazole (PRILOSEC) 40 MG capsule Take 40 mg by mouth daily before breakfast.     Probiotic Product (ALIGN) 4 MG CAPS Take 4 mg by mouth daily.     promethazine-dextromethorphan (PROMETHAZINE-DM) 6.25-15 MG/5ML syrup Take 5 mLs by mouth 4 (four) times daily as needed for cough.     pyridOXINE (VITAMIN B-6) 100 MG tablet Take 200 mg by mouth daily.      sevelamer carbonate (RENVELA) 800 MG tablet Take 800 mg by mouth See admin instructions. Take 800 mg with each meal and each snack     No current facility-administered medications for this visit.     Review of  Systems    ***.  All other systems reviewed and are otherwise negative except as noted above.    Physical Exam    VS:  There were no vitals taken for this visit. , BMI There is no height or weight on file to calculate BMI.     GEN: Well nourished, well developed, in no acute distress. HEENT: normal. Neck: Supple, no JVD, carotid bruits, or masses. Cardiac: RRR, no murmurs, rubs, or gallops. No clubbing, cyanosis, edema.  Radials/DP/PT 2+ and equal bilaterally.  Respiratory:  Respirations regular and unlabored, clear to auscultation bilaterally. GI: Soft, nontender, nondistended, BS + x 4. MS: no deformity or atrophy. Skin: warm and dry, no rash. Neuro:  Strength and sensation are intact. Psych: Normal affect.  Accessory Clinical Findings    ECG personally reviewed by me today - *** - no acute changes.   Lab Results  Component Value Date   WBC 5.8 02/19/2022   HGB 9.6 (L) 02/19/2022   HCT 28.4 (L) 02/19/2022   MCV 104.0 (H) 02/19/2022   PLT 194 02/19/2022   Lab Results  Component Value Date   CREATININE 4.20 (H) 02/20/2022   BUN 32 (H) 02/20/2022   NA 135 02/20/2022  K 4.2 02/20/2022   CL 96 (L) 02/20/2022   CO2 28 02/20/2022   Lab Results  Component Value Date   ALT 20 02/19/2022   AST 24 02/19/2022   ALKPHOS 136 (H) 02/19/2022   BILITOT 0.8 02/19/2022   Lab Results  Component Value Date   CHOL 115 02/19/2022   HDL 30 (L) 02/19/2022   LDLCALC 59 02/19/2022   TRIG 132 02/19/2022   CHOLHDL 3.8 02/19/2022    Lab Results  Component Value Date   HGBA1C 5.5 02/19/2022    Assessment & Plan    1.  ***  No BP recorded.  {Refresh Note OR Click here to enter BP  :1}***   Lenna Sciara, NP 03/11/2022, 1:31 PM

## 2022-03-12 ENCOUNTER — Ambulatory Visit: Payer: Medicare PPO | Admitting: Nurse Practitioner

## 2022-03-29 ENCOUNTER — Encounter: Payer: Self-pay | Admitting: Nurse Practitioner

## 2022-03-29 ENCOUNTER — Ambulatory Visit: Payer: Medicare PPO | Attending: Nurse Practitioner | Admitting: Nurse Practitioner

## 2022-03-29 VITALS — BP 140/78 | HR 87 | Ht 60.0 in | Wt 140.8 lb

## 2022-03-29 DIAGNOSIS — R600 Localized edema: Secondary | ICD-10-CM

## 2022-03-29 DIAGNOSIS — R0989 Other specified symptoms and signs involving the circulatory and respiratory systems: Secondary | ICD-10-CM

## 2022-03-29 DIAGNOSIS — Z992 Dependence on renal dialysis: Secondary | ICD-10-CM

## 2022-03-29 DIAGNOSIS — I25708 Atherosclerosis of coronary artery bypass graft(s), unspecified, with other forms of angina pectoris: Secondary | ICD-10-CM | POA: Diagnosis not present

## 2022-03-29 DIAGNOSIS — E785 Hyperlipidemia, unspecified: Secondary | ICD-10-CM | POA: Diagnosis not present

## 2022-03-29 DIAGNOSIS — I25118 Atherosclerotic heart disease of native coronary artery with other forms of angina pectoris: Secondary | ICD-10-CM

## 2022-03-29 DIAGNOSIS — I10 Essential (primary) hypertension: Secondary | ICD-10-CM

## 2022-03-29 DIAGNOSIS — Z8673 Personal history of transient ischemic attack (TIA), and cerebral infarction without residual deficits: Secondary | ICD-10-CM

## 2022-03-29 DIAGNOSIS — E1122 Type 2 diabetes mellitus with diabetic chronic kidney disease: Secondary | ICD-10-CM

## 2022-03-29 DIAGNOSIS — N186 End stage renal disease: Secondary | ICD-10-CM

## 2022-03-29 NOTE — Progress Notes (Signed)
Office Visit    Patient Name: Dana Jackson Date of Encounter: 03/29/2022  Primary Care Provider:  Roger Kill, PA-C Primary Cardiologist:  Bryan Lemma, MD  Chief Complaint    77 year old female with a history of CAD, hypertension, hyperlipidemia, ESRD on HD, TIA, renal cell carcinoma s/p partial left nephrectomy, lung cancer s/p radiation therapy, and type 2 diabetes who presents for follow-up related to CAD.   Past Medical History    Past Medical History:  Diagnosis Date   Anemia    Arthritis    hands   Asthma    childhood   Bacteremia due to Escherichia coli 06/2013   Currently being treated with anti-biotics   CAD in native artery 12/2010   a) Cath for exertional angina & EKG changes: 40% LM, 80% mid LAD.  95% ost cX, 80-90% PDA --> CABG X3; b) Post CABG CATH for + Myoview with basal anterior ischemia -> Ost LAD 80% (diffuse) then 100% after SP2, RI 70% (too small for PCI), Ost-prox Cx 99% & OM1 90%, OstrPDA 80% (small); Occluded SVG-rPDA.  Patent LIMA-dLAD, SVG-OM: culprit ~ p-m LAD pre-LIMA & RI - not good PCI target --> Med Rx   CKD (chronic kidney disease) stage 3, GFR 30-59 ml/min (HCC)    Degenerative disc disease, cervical    Degenerative disc disease, cervical [722.4]   Diabetes mellitus without complication (HCC)    Type II   Dyspnea    Endometrial cancer (HCC) 11/2013   Treated with TAH with pelvic lymphadenectomy followed by radiation and chemotherapy   GERD (gastroesophageal reflux disease)    Hearing loss    bilateral - no hearing aids   Heart murmur    History of asthma     childhood   History of blood transfusion    History of pneumonia    "2-3 times"   History of unstable angina November/December 2012   T wave inversions in inferolateral leads.  No stress test performed.   Hyperlipidemia LDL goal <70    Hypertension, essential, benign    Neuropathy    hands   Pneumonia 2018   Renal cell carcinoma (HCC) 11/18/2008   T2aNx s/p  partial left nephrectomy   S/P CABG x 3 12/2010   LIMA-LAD, SVG RPL, SVG-Circumflex   Stroke (HCC)    TIA- no residual    TIA (transient ischemic attack) 1992 &  2010   Past Surgical History:  Procedure Laterality Date   ANTERIOR CERVICAL DECOMP/DISCECTOMY FUSION  10/11/2005   multi-level   AV FISTULA PLACEMENT Left 08/03/2017   Procedure: ARTERIOVENOUS (AV) FISTULA CREATION LEFT ARM;  Surgeon: Larina Earthly, MD;  Location: [facility];  Service: Vascular;  Laterality: Left;   BRONCHIAL BIOPSY  12/30/2020   Procedure: BRONCHIAL BIOPSIES;  Surgeon: Josephine Igo, DO;  Location: [facility];  Service: Pulmonary;;   BRONCHIAL BIOPSY  02/03/2021   Procedure: BRONCHIAL BIOPSIES;  Surgeon: Josephine Igo, DO;  Location: [facility];  Service: Pulmonary;;   BRONCHIAL BRUSHINGS  12/30/2020   Procedure: BRONCHIAL BRUSHINGS;  Surgeon: Josephine Igo, DO;  [facility]: [facility];  Service: Pulmonary;;   BRONCHIAL BRUSHINGS  02/03/2021   Procedure: BRONCHIAL BRUSHINGS;  Surgeon: Josephine Igo, DO;  Location: [facility];  Service: Pulmonary;;   BRONCHIAL NEEDLE ASPIRATION BIOPSY  12/30/2020   Procedure: BRONCHIAL NEEDLE ASPIRATION BIOPSIES;  Surgeon: Josephine Igo, DO;  [facility]: [facility];  Service: Pulmonary;;   BRONCHIAL NEEDLE ASPIRATION BIOPSY  02/03/2021   Procedure: BRONCHIAL  NEEDLE ASPIRATION BIOPSIES;  Surgeon: Josephine Igo, DO;  Location: [facility];  Service: Pulmonary;;   CARDIAC CATHETERIZATION  12/24/10   40% left main, 80% mid LAD, 95% ostial circumflex, 80-90% PDA.   CARDIAC CATHETERIZATION N/A 07/01/2014   Procedure: Left Heart Cath and Cors/Grafts Angiography;  Surgeon: Marykay Lex, MD;  Location: [facility]:  For Abn Nuc @ [facility]: Ost LAD 80%, mid LAD 100% after S/P 2, 70% RI (no PCI target), ost-prox Cx 99%, OM1 90%. Ost rPDA 80% (~ pre-CABG), occluded SVG-rPDA, patent LIMA-dLAD, SVG OM; potential culprit for abn Nuc scan = pLAD Dz, small RI 70% or PDA -> med  Rx   CAROTID ENDARTERECTOMY Left    CHOLECYSTECTOMY  ~ 1971   COLONOSCOPY     CORONARY ARTERY BYPASS GRAFT  12/25/2010   Procedure: CORONARY ARTERY BYPASS GRAFTING (CABGX3 - LIMA-LAD, SVG RPL, SVG-Circumflex);  Surgeon: Purcell Nails, MD;  Location: [facility];  Service: Open Heart Surgery;  Laterality: N/A;   DILATATION & CURETTAGE/HYSTEROSCOPY WITH MYOSURE N/A 11/02/2013   Procedure: DILATATION & CURETTAGE/HYSTEROSCOPY WITH MYOSURE ABLATION;  Surgeon: Leslie Andrea, MD;  Location: [facility];  Service: Gynecology;  Laterality: N/A;   DOPPLER ECHOCARDIOGRAPHY  12/08/2010   EF =>55%,MILD CONCENTRIC LEFT VENTRICULAR HYPERTROPHY   EYE SURGERY Bilateral    Cataract   IR DIALY SHUNT INTRO NEEDLE/INTRACATH INITIAL W/IMG LEFT Left 04/24/2019   IR FLUORO GUIDE CV LINE LEFT  04/26/2019   IR US GUIDE VASC ACCESS LEFT  04/24/2019   IR US GUIDE VASC ACCESS LEFT  04/26/2019   LEFT HEART CATH AND CORS/GRAFTS ANGIOGRAPHY N/A 10/13/2021   Procedure: LEFT HEART CATH AND CORS/GRAFTS ANGIOGRAPHY;  Surgeon: Lyn Records, MD;  [facility];  Service: Cardiovascular;  Laterality: N/A;   LEFT HEART CATHETERIZATION WITH CORONARY ANGIOGRAM N/A 12/24/2010   Procedure: LEFT HEART CATHETERIZATION WITH CORONARY ANGIOGRAM;  Surgeon: Marykay Lex, MD;  Location: [facility] CATH LAB;  Service: Cardiovascular;  Laterality: N/A;   NM MYOCAR SINGLE W/SPECT  07/26/2007   EF 79%, LEFT VENT.FUNCTION NORMAL   PARTIAL NEPHRECTOMY Left 11/18/2008   left partial nephrectomy for renal cell CA   PET Myocardial Perfusion Scan  06/13/2014   At [facility]: Moderate size, mild severity completely reversible defect involving the basal anterior, mid anterior and apical anterior segments consistent with ischemia. EF 65% with normal global function.   TONSILLECTOMY     "in college"   TOTAL ABDOMINAL HYSTERECTOMY  November 2015    At [facility]: Robotic procedure with pelvic lymphadenectomy   TRANSTHORACIC ECHOCARDIOGRAM   06/13/2014   At [facility]: EF 60-65%. Grade 1 diastolic dysfunction. Mild MR. Aortic sclerosis. Moderate pulmonary hypertension   TUBAL LIGATION  ~ 1984   UPPER GI ENDOSCOPY     VIDEO BRONCHOSCOPY WITH RADIAL ENDOBRONCHIAL ULTRASOUND  12/30/2020   Procedure: RADIAL ENDOBRONCHIAL ULTRASOUND;  Surgeon: Josephine Igo, DO;  Location: [facility];  Service: Pulmonary;;   VIDEO BRONCHOSCOPY WITH RADIAL ENDOBRONCHIAL ULTRASOUND  02/03/2021   Procedure: RADIAL ENDOBRONCHIAL ULTRASOUND;  Surgeon: Josephine Igo, DO;  Location: [facility];  Service: Pulmonary;;   WISDOM TOOTH EXTRACTION      Allergies  Allergies  Allergen Reactions   Sulfa Antibiotics Other (See Comments)    "AKI"    Erythromycin Itching   Pravastatin Other (See Comments)    Unknown reaction   Simvastatin Other (See Comments)    Reaction not known   Codeine Nausea Only  and Other (See Comments)    Tolerate Hycodan   Gemfibrozil Itching     Labs/Other Studies Reviewed    The following studies were reviewed today: [facility] 10/17/2021: CONCLUSIONS: New culprit total occlusion of SVG to the obtuse marginal Chronic total occlusion of SVG to PDA Patent LIMA to apical LAD which is totally occluded proximal to the graft insertion site and in the apical segment beyond the graft and insertion site. 70% distal left main.   70% ostial LAD and sequential 80% stenoses in the proximal and mid LAD Ostial 95 to 99% heavily calcified circumflex with severe diffuse disease in the dominant obtuse marginal. Moderate mid RCA disease and 70 to 80% distal disease.  95% stenosis in the PDA and 70% proximal LV branch disease. Preserved LV function with EF approximately 50 to 60% with EDP 12 mmHg.     RECOMMENDATIONS:   There are no good PCI or surgical options.  We will have other team members to look at pictures and give their opinion.  If refractory angina unresponsive to medical therapy we could treat the right coronary however it is  the least severely diseased vessel of the 3 major epicardial coronaries.  I do not believe repeat surgery is an option at her age and with end-stage kidney disease.   Echo 2021/10/17: IMPRESSIONS    1. Left ventricular ejection fraction, by estimation, is 60 to 65%. The  left ventricle has normal function. The left ventricle has no regional  wall motion abnormalities. There is mild concentric left ventricular  hypertrophy. Left ventricular diastolic  parameters are consistent with Grade II diastolic dysfunction  (pseudonormalization). Elevated left atrial pressure.   2. Right ventricular systolic function is normal. The right ventricular  size is normal. There is normal pulmonary artery systolic pressure. The  estimated right ventricular systolic pressure is 25.8 mmHg.   3. The mitral valve is normal in structure. Mild mitral valve  regurgitation. Moderate to severe mitral annular calcification.   4. The aortic valve is tricuspid. There is mild calcification of the  aortic valve. There is mild thickening of the aortic valve. Aortic valve  regurgitation is not visualized. Aortic valve sclerosis/calcification is  present, without any evidence of  aortic stenosis.   5. The inferior vena cava is normal in size with greater than 50%  respiratory variability, suggesting right atrial pressure of 3 mmHg.   Recent Labs: 10/19/2021: B Natriuretic Peptide 639.1 02/19/2022: ALT 20; Hemoglobin 9.6; Magnesium 2.2; Platelets 194 02/20/2022: BUN 32; Creatinine, Ser 4.20; Potassium 4.2; Sodium 135  Recent Lipid Panel    Component Value Date/Time   CHOL 115 02/19/2022 0713   CHOL 170 11/09/2021 0944   TRIG 132 02/19/2022 0713   HDL 30 (L) 02/19/2022 0713   HDL 36 (L) 11/09/2021 0944   CHOLHDL 3.8 02/19/2022 0713   VLDL 26 02/19/2022 0713   LDLCALC 59 02/19/2022 0713   LDLCALC 102 (H) 11/09/2021 0944    History of Present Illness    77 year old female with the above past medical history including  CAD, hypertension, hyperlipidemia, ESRD on HD, TIA, renal cell carcinoma s/p partial left nephrectomy, lung cancer s/p radiation therapy, and type 2 diabetes who presents for hospital follow-up related to CAD s/p NSTEMI.   She has a history of CAD s/p CABG x3 (LIMA-LAD, SVG, RPL, SVG-Circumflex) in 2012.  She had an abnormal cardiac PET stress test in 2016.  Cardiac catheterization in June 2016 revealed occluded SVG with severe ostial PDA disease, moderate mid  RCA disease with stable flow to the post anterolateral system, patent LIMA to diffusely diseased LAD and patent vein graft to OM1 with moderate disease in the AV groove circumflex lesion OM 2.  She was hospitalized in 10/2021 in the setting of NSTEMI. 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 Repeat cardiac catheterization on  revealed new total occlusion of saphenous vein graft to the obtuse marginal, chronic total occlusion of saphenous vein graft to PDA, patent LIMA to the apical LAD which was totaled proximally and distal to the insertion site, 70% left main, 70% followed by 80% proximal and mid LAD, 99% ostial circumflex, 70 to 80% right coronary artery, 95% PDA and normal LV function.  Medical therapy recommended.  It was noted that if symptoms persisted, PCI of the RCA could be considered.  Unfortunately, she was hospitalized again in 10/2021 in the setting of septic shock due to pneumonia. MBS per speech therapy which showed no evidence of aspiration. CT showed stable bilateral pulmonary nodules, incidental finding of 1.7 cystic lesion in the body of pancreas.  Follow-up MRI was recommended in 6 months. She was last seen in the office on 11/09/2021 and was stable from a cardiac standpoint. She was hospitalized in February 2024 in the setting of acute hypoxic respiratory failure due to COVID-19, chest pain.  Troponin was elevated.  No further  intervention was recommended per cardiology.  She was discharged home in stable condition on 02/21/2022.  She presents today for follow-up accompanied by her husband.  Since her last visit she has been stable from a cardiac standpoint.  She notes infrequent fleeting chest discomfort, occasional dyspnea on exertion, overall stable since her last visit.  She denies palpitations, dizziness, edema, PND, orthopnea, weight gain.  Overall, she reports feeling well.    Home Medications    Current Outpatient Medications  Medication Sig Dispense Refill   acetaminophen (TYLENOL) 500 MG tablet Take 1,000 mg by mouth every 6 (six) hours as needed for moderate pain.     albuterol (VENTOLIN HFA) 108 (90 Base) MCG/ACT inhaler Inhale 1-2 puffs into the lungs every 4 (four) hours as needed for wheezing or shortness of breath.     Alpha-Lipoic Acid 600 MG CAPS Take 600 mg by mouth daily.     aspirin 81 MG tablet Take 81 mg by mouth at bedtime.     atorvastatin (LIPITOR) 80 MG tablet Take 1 tablet (80 mg total) by mouth at bedtime. 90 tablet 0   benzonatate (TESSALON) 100 MG capsule Take 100-200 mg by mouth 3 (three) times daily as needed for cough.     cinacalcet (SENSIPAR) 30 MG tablet Take 30 mg by mouth daily with breakfast.     clopidogrel (PLAVIX) 75 MG tablet Take 1 tablet (75 mg total) by mouth daily. 90 tablet 3   Darbepoetin Alfa (ARANESP) 60 MCG/0.3ML SOSY injection Inject 0.3 mLs (60 mcg total) into the vein every Tuesday with hemodialysis. (Patient taking differently: Inject 60 mcg into the vein See admin instructions. Given at dialysis) 4.2 mL    estradiol (ESTRACE) 0.1 MG/GM vaginal cream Place 1 Applicatorful vaginally 3 (three) times a week.     ezetimibe (ZETIA) 10 MG tablet Take 1 tablet (10 mg total) by mouth daily. 90 tablet 1   famotidine (PEPCID) 20 MG tablet Take 1 tablet (20 mg total) by mouth at bedtime. 30 tablet 2   furosemide (LASIX)  20 MG tablet Take 20 mg on Tuesdays and Saturdays  (Patient taking differently: Take 20 mg by mouth See admin instructions. Tuesdays and Saturdays) 30 tablet 3   gabapentin (NEURONTIN) 100 MG capsule Take 100 mg by mouth 2 (two) times daily.     isosorbide mononitrate (IMDUR) 60 MG 24 hr tablet TAKE 1 TABLET BY MOUTH EVERY DAY (Patient taking differently: Take 60 mg by mouth at bedtime.) 90 tablet 3   lidocaine-prilocaine (EMLA) cream Apply 1 application topically Every Tuesday,Thursday,and Saturday with dialysis.     loperamide (IMODIUM A-D) 2 MG tablet Take 4 mg by mouth as needed for diarrhea or loose stools.     loratadine (CLARITIN) 10 MG tablet Take 10 mg by mouth daily.     magnesium oxide (MAG-OX) 400 MG tablet Take 400 mg by mouth daily.     metoprolol tartrate (LOPRESSOR) 50 MG tablet Take 1 tablet (50 mg total) by mouth 2 (two) times daily. 60 tablet 7   nitroGLYCERIN (NITROSTAT) 0.4 MG SL tablet PLACE 1 TABLET UNDER THE TONGUE EVERY 5 MINUTES AS NEEDED FOR CHEST PAIN. (Patient taking differently: Place 0.4 mg under the tongue every 5 (five) minutes as needed for chest pain.) 25 tablet 11   Omega-3 Fatty Acids (FISH OIL) 1200 MG CAPS Take 2,400 mg by mouth in the morning and at bedtime.     omeprazole (PRILOSEC) 40 MG capsule Take 40 mg by mouth daily before breakfast.     Probiotic Product (ALIGN) 4 MG CAPS Take 4 mg by mouth daily.     promethazine-dextromethorphan (PROMETHAZINE-DM) 6.25-15 MG/5ML syrup Take 5 mLs by mouth 4 (four) times daily as needed for cough.     pyridOXINE (VITAMIN B-6) 100 MG tablet Take 200 mg by mouth daily.      sevelamer carbonate (RENVELA) 800 MG tablet Take 800 mg by mouth See admin instructions. Take 800 mg with each meal and each snack     No current facility-administered medications for this visit.     Review of Systems    She denies palpitations, pnd, orthopnea, n, v, dizziness, syncope, edema, weight gain, or early satiety. All other systems reviewed and are otherwise negative except as noted  above.   Physical Exam    VS:  BP (!) 140/78   Pulse 87   Ht 5' (1.524 m)   Wt 140 lb 12.8 oz (63.9 kg)   SpO2 94%   BMI 27.50 kg/m  GEN: Well nourished, well developed, in no acute distress. HEENT: normal. Neck: Supple, no JVD, L carotid bruit, no masses. Cardiac: RRR, no murmurs, rubs, or gallops. No clubbing, cyanosis, edema.  Radials/DP/PT 2+ and equal bilaterally.  Respiratory:  Respirations regular and unlabored, clear to auscultation bilaterally. GI: Soft, nontender, nondistended, BS + x 4. MS: no deformity or atrophy. Skin: warm and dry, no rash. Neuro:  Strength and sensation are intact. Psych: Normal affect.  Accessory Clinical Findings    ECG personally reviewed by me today -no EKG in office today.  Lab Results  Component Value Date   WBC 5.8 02/19/2022   HGB 9.6 (L) 02/19/2022   HCT 28.4 (L) 02/19/2022   MCV 104.0 (H) 02/19/2022   PLT 194 02/19/2022   Lab Results  Component Value Date   CREATININE 4.20 (H) 02/20/2022   BUN 32 (H) 02/20/2022   NA 135 02/20/2022   K 4.2 02/20/2022   CL 96 (L) 02/20/2022   CO2 28 02/20/2022   Lab Results  Component Value Date  ALT 20 02/19/2022   AST 24 02/19/2022   ALKPHOS 136 (H) 02/19/2022   BILITOT 0.8 02/19/2022   Lab Results  Component Value Date   CHOL 115 02/19/2022   HDL 30 (L) 02/19/2022   LDLCALC 59 02/19/2022   TRIG 132 02/19/2022   CHOLHDL 3.8 02/19/2022    Lab Results  Component Value Date   HGBA1C 5.5 02/19/2022    Assessment & Plan   1. CAD: S/p CABG x3 (LIMA-LAD, SVG, RPL, SVG-Circumflex) in 2012. Most recent cath in the setting of NSTEMI on 10/13/2021 revealed new total occlusion of saphenous vein graft to the obtuse marginal, chronic total occlusion of saphenous vein graft to PDA, patent LIMA to the apical LAD which was totaled proximally and distal to the insertion site, 70% left main, 70% followed by 80% proximal and mid LAD, 99% ostial circumflex, 70 to 80% right coronary artery, 95% PDA  and normal LV function. Medical therapy recommended. It was noted that if symptoms persisted, PCI of the RCA could be considered-though she is not an ideal candidate for cath given ESRD. She notes intermittent fleeting chest discomfort, overall stable. If persistent, could consider increasing Imdur if BP allows.  Patient declines titration of Imdur at this time. Continue aspirin, Plavix,metoprolol, Imdur, Lipitor and Zetia.    2. Bilateral lower extremity edema: Most recent echo in 10/2021 showed EF 60 to 65%, normal LV function, no RWMA, mild concentric LVH, G2 DD, mild mitral valve regurgitation, moderate to severe MAC. Euvolemic and well compensated on exam. Continue Lasix.    3. Hypertension: BP slightly elevated in office today, she has not taken all of her medication this morning. BP has been stable at home with SBP in the 110s-120s. Overall, well controlled. Continue current antihypertensive regimen.    4. Hyperlipidemia: LDL was 59 in 02/2022.  Continue Lipitor, Zetia.      5. ESRD: On HD TTS. Follows with nephrology.    6. History of TIA/L carotid bruit: No recurrence. Left carotid bruit noted on exam today.  Will check carotid Dopplers. Continue aspirin, Plavix, Lipitor, Zetia.   7. Type 2 diabetes: A1c was 5.1 in 10/2021.  Monitored and managed per PCP.    8. 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  Follow-up MRI was recommended in 6 months.  Recommend follow-up with PCP.  9. Disposition: Follow-up in 6 months.   HYPERTENSION CONTROL Vitals:   03/29/22 0957 03/29/22 1024  BP: (!) 158/80 (!) 140/78    The patient's blood pressure is elevated above target today.  In order to address the patient's elevated BP: Blood pressure will be monitored at home to determine if medication changes need to be made.; Follow up with general cardiology has been recommended.      Joylene Grapes, NP 03/29/2022, 10:27 AM

## 2022-03-29 NOTE — Patient Instructions (Signed)
Medication Instructions:  Your physician recommends that you continue on your current medications as directed. Please refer to the Current Medication list given to you today.   *If you need a refill on your cardiac medications before your next appointment, please call your pharmacy*   Lab Work: NONE ordered at this time of appointment   If you have labs (blood work) drawn today and your tests are completely normal, you will receive your results only by: [facility] (if you have MyChart) OR A paper copy in the mail If you have any lab test that is abnormal or we need to change your treatment, we will call you to review the results.   Testing/Procedures: Your physician has requested that you have a carotid duplex. This test is an ultrasound of the carotid arteries in your neck. It looks at blood flow through these arteries that supply the brain with blood. Allow one hour for this exam. There are no restrictions or special instructions.    Follow-Up: At [facility], you and your health needs are our priority.  As part of our continuing mission to provide you with exceptional heart care, we have created designated Provider Care Teams.  These Care Teams include your primary Cardiologist (physician) and Advanced Practice Providers (APPs -  Physician Assistants and Nurse Practitioners) who all work together to provide you with the care you need, when you need it.  We recommend signing up for the patient portal called "MyChart".  Sign up information is provided on this After Visit Summary.  MyChart is used to connect with patients for Virtual Visits (Telemedicine).  Patients are able to view lab/test results, encounter notes, upcoming appointments, etc.  Non-urgent messages can be sent to your provider as well.   To learn more about what you can do with MyChart, go to [email or web address].    Your next appointment:   6 month(s)  Provider:   Glenetta Hew, MD     Other  Instructions

## 2022-03-30 ENCOUNTER — Encounter: Payer: Self-pay | Admitting: Nurse Practitioner

## 2022-04-07 ENCOUNTER — Ambulatory Visit (HOSPITAL_COMMUNITY)
Admission: RE | Admit: 2022-04-07 | Discharge: 2022-04-07 | Disposition: A | Discharge status: Home / Self Care | Payer: Medicare PPO | Source: Ambulatory Visit | Attending: Internal Medicine | Admitting: Internal Medicine

## 2022-04-07 DIAGNOSIS — R0989 Other specified symptoms and signs involving the circulatory and respiratory systems: Secondary | ICD-10-CM | POA: Insufficient documentation

## 2022-04-09 ENCOUNTER — Telehealth: Payer: Self-pay

## 2022-04-09 NOTE — Telephone Encounter (Signed)
Spoke with pt. Pt was notified of carotid study results. Pt will continue current medication and f/u as planned.

## 2022-04-10 ENCOUNTER — Other Ambulatory Visit: Payer: Self-pay | Admitting: Nurse Practitioner

## 2022-07-12 ENCOUNTER — Other Ambulatory Visit: Payer: Self-pay | Admitting: Cardiology

## 2022-07-12 ENCOUNTER — Other Ambulatory Visit: Payer: Self-pay | Admitting: Nurse Practitioner

## 2022-09-27 ENCOUNTER — Encounter: Payer: Self-pay | Admitting: Nurse Practitioner

## 2022-09-27 ENCOUNTER — Ambulatory Visit: Payer: Medicare PPO | Attending: Nurse Practitioner | Admitting: Nurse Practitioner

## 2022-09-27 VITALS — BP 122/68 | HR 87 | Ht 60.0 in | Wt 144.0 lb

## 2022-09-27 DIAGNOSIS — Z8673 Personal history of transient ischemic attack (TIA), and cerebral infarction without residual deficits: Secondary | ICD-10-CM

## 2022-09-27 DIAGNOSIS — I10 Essential (primary) hypertension: Secondary | ICD-10-CM | POA: Diagnosis not present

## 2022-09-27 DIAGNOSIS — R600 Localized edema: Secondary | ICD-10-CM | POA: Diagnosis not present

## 2022-09-27 DIAGNOSIS — E785 Hyperlipidemia, unspecified: Secondary | ICD-10-CM | POA: Diagnosis not present

## 2022-09-27 DIAGNOSIS — I6523 Occlusion and stenosis of bilateral carotid arteries: Secondary | ICD-10-CM

## 2022-09-27 DIAGNOSIS — E1122 Type 2 diabetes mellitus with diabetic chronic kidney disease: Secondary | ICD-10-CM

## 2022-09-27 DIAGNOSIS — Z992 Dependence on renal dialysis: Secondary | ICD-10-CM

## 2022-09-27 DIAGNOSIS — N185 Chronic kidney disease, stage 5: Secondary | ICD-10-CM

## 2022-09-27 DIAGNOSIS — I2510 Atherosclerotic heart disease of native coronary artery without angina pectoris: Secondary | ICD-10-CM | POA: Diagnosis not present

## 2022-09-27 DIAGNOSIS — N186 End stage renal disease: Secondary | ICD-10-CM

## 2022-09-27 NOTE — Progress Notes (Signed)
Office Visit    Patient Name: Dana Jackson Date of Encounter: 09/27/2022  Primary Care Provider:  Roger Kill, PA-C Primary Cardiologist:  Bryan Lemma, MD  Chief Complaint    77 year old female with a history of CAD, hypertension, hyperlipidemia, ESRD on HD, TIA, renal cell carcinoma s/p partial left nephrectomy, lung cancer s/p radiation therapy, and type 2 diabetes who presents for follow-up related to CAD.   Past Medical History    Past Medical History:  Diagnosis Date   Anemia    Arthritis    hands   Asthma    childhood   Bacteremia due to Escherichia coli 06/2013   Currently being treated with anti-biotics   CAD in native artery 12/2010   a) Cath for exertional angina & EKG changes: 40% LM, 80% mid LAD.  95% ost cX, 80-90% PDA --> CABG X3; b) Post CABG CATH for + Myoview with basal anterior ischemia -> Ost LAD 80% (diffuse) then 100% after SP2, RI 70% (too small for PCI), Ost-prox Cx 99% & OM1 90%, OstrPDA 80% (small); Occluded SVG-rPDA.  Patent LIMA-dLAD, SVG-OM: culprit ~ p-m LAD pre-LIMA & RI - not good PCI target --> Med Rx   CKD (chronic kidney disease) stage 3, GFR 30-59 ml/min (HCC)    Degenerative disc disease, cervical    Degenerative disc disease, cervical [722.4]   Diabetes mellitus without complication (HCC)    Type II   Dyspnea    Endometrial cancer (HCC) 11/2013   Treated with TAH with pelvic lymphadenectomy followed by radiation and chemotherapy   GERD (gastroesophageal reflux disease)    Hearing loss    bilateral - no hearing aids   Heart murmur    History of asthma     childhood   History of blood transfusion    History of pneumonia    "2-3 times"   History of unstable angina November/December 2012   T wave inversions in inferolateral leads.  No stress test performed.   Hyperlipidemia LDL goal <70    Hypertension, essential, benign    Neuropathy    hands   Pneumonia 2018   Renal cell carcinoma (HCC) 11/18/2008   T2aNx s/p  partial left nephrectomy   S/P CABG x 3 12/2010   LIMA-LAD, SVG RPL, SVG-Circumflex   Stroke (HCC)    TIA- no residual    TIA (transient ischemic attack) 1992 &  2010   Past Surgical History:  Procedure Laterality Date   ANTERIOR CERVICAL DECOMP/DISCECTOMY FUSION  10/11/2005   multi-level   AV FISTULA PLACEMENT Left 08/03/2017   Procedure: ARTERIOVENOUS (AV) FISTULA CREATION LEFT ARM;  Surgeon: Larina Earthly, MD;  Location: [facility];  Service: Vascular;  Laterality: Left;   BRONCHIAL BIOPSY  12/30/2020   Procedure: BRONCHIAL BIOPSIES;  Surgeon: Josephine Igo, DO;  Location: [facility];  Service: Pulmonary;;   BRONCHIAL BIOPSY  02/03/2021   Procedure: BRONCHIAL BIOPSIES;  Surgeon: Josephine Igo, DO;  [facility]: [facility];  Service: Pulmonary;;   BRONCHIAL BRUSHINGS  12/30/2020   Procedure: BRONCHIAL BRUSHINGS;  Surgeon: Josephine Igo, DO;  [facility];  Service: Pulmonary;;   BRONCHIAL BRUSHINGS  02/03/2021   Procedure: BRONCHIAL BRUSHINGS;  Surgeon: Josephine Igo, DO;  [facility];  Service: Pulmonary;;   BRONCHIAL NEEDLE ASPIRATION BIOPSY  12/30/2020   Procedure: BRONCHIAL NEEDLE ASPIRATION BIOPSIES;  Surgeon: Josephine Igo, DO;  [facility];  Service: Pulmonary;;   BRONCHIAL NEEDLE ASPIRATION BIOPSY  02/03/2021   Procedure: BRONCHIAL  NEEDLE ASPIRATION BIOPSIES;  Surgeon: Josephine Igo, DO;  [facility]: [facility];  Service: Pulmonary;;   CARDIAC CATHETERIZATION  12/24/10   40% left main, 80% mid LAD, 95% ostial circumflex, 80-90% PDA.   CARDIAC CATHETERIZATION N/A 07/01/2014   Procedure: Left Heart Cath and Cors/Grafts Angiography;  Surgeon: Marykay Lex, MD;  [facility]:  For Abn Nuc @ [facility]: Ost LAD 80%, mid LAD 100% after S/P 2, 70% RI (no PCI target), ost-prox Cx 99%, OM1 90%. Ost rPDA 80% (~ pre-CABG), occluded SVG-rPDA, patent LIMA-dLAD, SVG OM; potential culprit for abn Nuc scan = pLAD Dz, small RI 70% or PDA -> med  Rx   CAROTID ENDARTERECTOMY Left    CHOLECYSTECTOMY  ~ 1971   COLONOSCOPY     CORONARY ARTERY BYPASS GRAFT  12/25/2010   Procedure: CORONARY ARTERY BYPASS GRAFTING (CABGX3 - LIMA-LAD, SVG RPL, SVG-Circumflex);  Surgeon: Purcell Nails, MD;  Location: [facility] OR;  Service: Open Heart Surgery;  Laterality: N/A;   DILATATION & CURETTAGE/HYSTEROSCOPY WITH MYOSURE N/A 11/02/2013   Procedure: DILATATION & CURETTAGE/HYSTEROSCOPY WITH MYOSURE ABLATION;  Surgeon: Leslie Andrea, MD;  Location: [facility];  Service: Gynecology;  Laterality: N/A;   DOPPLER ECHOCARDIOGRAPHY  12/08/2010   EF =>55%,MILD CONCENTRIC LEFT VENTRICULAR HYPERTROPHY   EYE SURGERY Bilateral    Cataract   IR DIALY SHUNT INTRO NEEDLE/INTRACATH INITIAL W/IMG LEFT Left 04/24/2019   IR FLUORO GUIDE CV LINE LEFT  04/26/2019   IR US GUIDE VASC ACCESS LEFT  04/24/2019   IR US GUIDE VASC ACCESS LEFT  04/26/2019   LEFT HEART CATH AND CORS/GRAFTS ANGIOGRAPHY N/A 10/13/2021   Procedure: LEFT HEART CATH AND CORS/GRAFTS ANGIOGRAPHY;  Surgeon: Lyn Records, MD;  [facility];  Service: Cardiovascular;  Laterality: N/A;   LEFT HEART CATHETERIZATION WITH CORONARY ANGIOGRAM N/A 12/24/2010   Procedure: LEFT HEART CATHETERIZATION WITH CORONARY ANGIOGRAM;  Surgeon: Marykay Lex, MD;  Location: [facility] CATH LAB;  Service: Cardiovascular;  Laterality: N/A;   NM MYOCAR SINGLE W/SPECT  07/26/2007   EF 79%, LEFT VENT.FUNCTION NORMAL   PARTIAL NEPHRECTOMY Left 11/18/2008   left partial nephrectomy for renal cell CA   PET Myocardial Perfusion Scan  06/13/2014   At [facility]: Moderate size, mild severity completely reversible defect involving the basal anterior, mid anterior and apical anterior segments consistent with ischemia. EF 65% with normal global function.   TONSILLECTOMY     "in college"   TOTAL ABDOMINAL HYSTERECTOMY  November 2015    At [facility]: Robotic procedure with pelvic lymphadenectomy   TRANSTHORACIC ECHOCARDIOGRAM   06/13/2014   At [facility]: EF 60-65%. Grade 1 diastolic dysfunction. Mild MR. Aortic sclerosis. Moderate pulmonary hypertension   TUBAL LIGATION  ~ 1984   UPPER GI ENDOSCOPY     VIDEO BRONCHOSCOPY WITH RADIAL ENDOBRONCHIAL ULTRASOUND  12/30/2020   Procedure: RADIAL ENDOBRONCHIAL ULTRASOUND;  Surgeon: Josephine Igo, DO;  [facility]: [facility];  Service: Pulmonary;;   VIDEO BRONCHOSCOPY WITH RADIAL ENDOBRONCHIAL ULTRASOUND  02/03/2021   Procedure: RADIAL ENDOBRONCHIAL ULTRASOUND;  Surgeon: Josephine Igo, DO;  Location: [facility];  Service: Pulmonary;;   WISDOM TOOTH EXTRACTION      Allergies  Allergies  Allergen Reactions   Sulfa Antibiotics Other (See Comments)    "AKI"    Erythromycin Itching   Pravastatin Other (See Comments)    Unknown reaction   Simvastatin Other (See Comments)    Reaction not known   Codeine Nausea Only  and Other (See Comments)    Tolerate Hycodan   Gemfibrozil Itching     Labs/Other Studies Reviewed    The following studies were reviewed today: [facility] 26-Nov-2021: CONCLUSIONS: New culprit total occlusion of SVG to the obtuse marginal Chronic total occlusion of SVG to PDA Patent LIMA to apical LAD which is totally occluded proximal to the graft insertion site and in the apical segment beyond the graft and insertion site. 70% distal left main.   70% ostial LAD and sequential 80% stenoses in the proximal and mid LAD Ostial 95 to 99% heavily calcified circumflex with severe diffuse disease in the dominant obtuse marginal. Moderate mid RCA disease and 70 to 80% distal disease.  95% stenosis in the PDA and 70% proximal LV branch disease. Preserved LV function with EF approximately 50 to 60% with EDP 12 mmHg.     RECOMMENDATIONS:   There are no good PCI or surgical options.  We will have other team members to look at pictures and give their opinion.  If refractory angina unresponsive to medical therapy we could treat the right coronary however it is  the least severely diseased vessel of the 3 major epicardial coronaries.  I do not believe repeat surgery is an option at her age and with end-stage kidney disease.   Echo 2021/11/26: IMPRESSIONS    1. Left ventricular ejection fraction, by estimation, is 60 to 65%. The  left ventricle has normal function. The left ventricle has no regional  wall motion abnormalities. There is mild concentric left ventricular  hypertrophy. Left ventricular diastolic  parameters are consistent with Grade II diastolic dysfunction  (pseudonormalization). Elevated left atrial pressure.   2. Right ventricular systolic function is normal. The right ventricular  size is normal. There is normal pulmonary artery systolic pressure. The  estimated right ventricular systolic pressure is 25.8 mmHg.   3. The mitral valve is normal in structure. Mild mitral valve  regurgitation. Moderate to severe mitral annular calcification.   4. The aortic valve is tricuspid. There is mild calcification of the  aortic valve. There is mild thickening of the aortic valve. Aortic valve  regurgitation is not visualized. Aortic valve sclerosis/calcification is  present, without any evidence of  aortic stenosis.   5. The inferior vena cava is normal in size with greater than 50%  respiratory variability, suggesting right atrial pressure of 3 mmHg.   Recent Labs: 10/19/2021: B Natriuretic Peptide 639.1 02/19/2022: ALT 20; Hemoglobin 9.6; Magnesium 2.2; Platelets 194 02/20/2022: BUN 32; Creatinine, Ser 4.20; Potassium 4.2; Sodium 135  Recent Lipid Panel    Component Value Date/Time   CHOL 115 02/19/2022 0713   CHOL 170 11/09/2021 0944   TRIG 132 02/19/2022 0713   HDL 30 (L) 02/19/2022 0713   HDL 36 (L) 11/09/2021 0944   CHOLHDL 3.8 02/19/2022 0713   VLDL 26 02/19/2022 0713   LDLCALC 59 02/19/2022 0713   LDLCALC 102 (H) 11/09/2021 0944    History of Present Illness    77 year old female with the above past medical history including  CAD, hypertension, hyperlipidemia, ESRD on HD, TIA, renal cell carcinoma s/p partial left nephrectomy, lung cancer s/p radiation therapy, and type 2 diabetes.   She has a history of CAD s/p CABG x3 (LIMA-LAD, SVG, RPL, SVG-Circumflex) in 2012.  She had an abnormal cardiac PET stress test in 2016.  Cardiac catheterization in June 2016 revealed occluded SVG with severe ostial PDA disease, moderate mid RCA disease with stable flow to the post anterolateral system,  patent LIMA to diffusely diseased LAD and patent vein graft to OM1 with moderate disease in the AV groove circumflex lesion OM 2.  She was hospitalized in 10/2021 in the setting of NSTEMI. 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 Repeat cardiac catheterization at the time revealed new total occlusion of saphenous vein graft to the obtuse marginal, chronic total occlusion of saphenous vein graft to PDA, patent LIMA to the apical LAD which was totaled proximally and distal to the insertion site, 70% left main, 70% followed by 80% proximal and mid LAD, 99% ostial circumflex, 70 to 80% right coronary artery, 95% PDA and normal LV function.  Medical therapy was recommended.  It was noted that if symptoms persisted, PCI of the RCA could be considered.  Unfortunately, she was hospitalized again in 10/2021 in the setting of septic shock due to pneumonia. MBS per speech therapy which showed no evidence of aspiration. CT showed stable bilateral pulmonary nodules, incidental finding of 1.7 cystic lesion in the body of pancreas.  Follow-up MRI was recommended in 6 months. She was hospitalized in February 2024 in the setting of acute hypoxic respiratory failure due to COVID-19, chest pain.  Troponin was elevated.  No further intervention was recommended per cardiology.  She was last seen in the office on 03/29/2022 and was stable from a cardiac standpoint.  She  noted intermittent fleeting chest discomfort.  She declined titration of antianginal therapy.  Repeat carotid Doppler showed 1 to 39% B ICA stenosis.  She presents today for follow-up accompanied by her husband.  Since her last visit she has been stable from a cardiac standpoint. Over the past six months she has had 3 brief episodes of mild dyspnea on exertion that improved with nitroglycerin and rest.  She denies any other symptoms concerning for angina.  She denies any chest pain, palpitations, edema, PND, orthopnea, weight gain.  Overall, she reports feeling well.  Home Medications    Current Outpatient Medications  Medication Sig Dispense Refill   acetaminophen (TYLENOL) 500 MG tablet Take 1,000 mg by mouth every 6 (six) hours as needed for moderate pain.     albuterol (VENTOLIN HFA) 108 (90 Base) MCG/ACT inhaler Inhale 1-2 puffs into the lungs every 4 (four) hours as needed for wheezing or shortness of breath.     Alpha-Lipoic Acid 600 MG CAPS Take 600 mg by mouth daily.     aspirin 81 MG tablet Take 81 mg by mouth at bedtime.     atorvastatin (LIPITOR) 80 MG tablet Take 1 tablet (80 mg total) by mouth at bedtime. 90 tablet 0   benzonatate (TESSALON) 100 MG capsule Take 100-200 mg by mouth 3 (three) times daily as needed for cough.     cinacalcet (SENSIPAR) 30 MG tablet Take 30 mg by mouth daily with breakfast.     clopidogrel (PLAVIX) 75 MG tablet Take 1 tablet (75 mg total) by mouth daily. 90 tablet 3   Darbepoetin Alfa (ARANESP) 60 MCG/0.3ML SOSY injection Inject 0.3 mLs (60 mcg total) into the vein every Tuesday with hemodialysis. (Patient taking differently: Inject 60 mcg into the vein See admin instructions. Given at dialysis) 4.2 mL    estradiol (ESTRACE) 0.1 MG/GM vaginal cream Place 1 Applicatorful vaginally 3 (three) times a week.     ezetimibe (ZETIA) 10 MG tablet TAKE 1 TABLET BY MOUTH EVERY DAY 90 tablet 1   famotidine (PEPCID)  20 MG tablet Take 1 tablet (20 mg total) by mouth at  bedtime. 30 tablet 2   furosemide (LASIX) 20 MG tablet TAKE 1 TABLET ON TUESDAYS AND SATURDAYS. Pt needs office visit for further refills. 90 tablet 0   gabapentin (NEURONTIN) 100 MG capsule Take 100 mg by mouth 2 (two) times daily.     isosorbide mononitrate (IMDUR) 60 MG 24 hr tablet TAKE 1 TABLET BY MOUTH EVERY DAY (Patient taking differently: Take 60 mg by mouth at bedtime.) 90 tablet 3   lidocaine-prilocaine (EMLA) cream Apply 1 application topically Every Tuesday,Thursday,and Saturday with dialysis.     loperamide (IMODIUM A-D) 2 MG tablet Take 4 mg by mouth as needed for diarrhea or loose stools.     loratadine (CLARITIN) 10 MG tablet Take 10 mg by mouth daily.     magnesium oxide (MAG-OX) 400 MG tablet Take 400 mg by mouth daily.     metoprolol tartrate (LOPRESSOR) 50 MG tablet TAKE 1 TABLET BY MOUTH TWICE A DAY 60 tablet 7   nitroGLYCERIN (NITROSTAT) 0.4 MG SL tablet PLACE 1 TABLET UNDER THE TONGUE EVERY 5 MINUTES AS NEEDED FOR CHEST PAIN. (Patient taking differently: Place 0.4 mg under the tongue every 5 (five) minutes as needed for chest pain.) 25 tablet 11   Omega-3 Fatty Acids (FISH OIL) 1200 MG CAPS Take 2,400 mg by mouth in the morning and at bedtime.     omeprazole (PRILOSEC) 40 MG capsule Take 40 mg by mouth daily before breakfast.     Probiotic Product (ALIGN) 4 MG CAPS Take 4 mg by mouth daily.     promethazine-dextromethorphan (PROMETHAZINE-DM) 6.25-15 MG/5ML syrup Take 5 mLs by mouth 4 (four) times daily as needed for cough.     pyridOXINE (VITAMIN B-6) 100 MG tablet Take 200 mg by mouth daily.      sevelamer carbonate (RENVELA) 800 MG tablet Take 800 mg by mouth See admin instructions. Take 800 mg with each meal and each snack     No current facility-administered medications for this visit.     Review of Systems    She denies chest pain, palpitations, pnd, orthopnea, n, v, dizziness, syncope, edema, weight gain, or early satiety. All other systems reviewed and are  otherwise negative except as noted above.   Physical Exam    VS:  BP 122/68   Pulse 87   Ht 5' (1.524 m)   Wt 144 lb (65.3 kg)   SpO2 91%   BMI 28.12 kg/m  GEN: Well nourished, well developed, in no acute distress. HEENT: normal. Neck: Supple, no JVD, L carotid bruit, no masses. Cardiac: RRR, no murmurs, rubs, or gallops. No clubbing, cyanosis, edema.  Radials/DP/PT 2+ and equal bilaterally.  Respiratory:  Respirations regular and unlabored, clear to auscultation bilaterally. GI: Soft, nontender, nondistended, BS + x 4. MS: no deformity or atrophy. Skin: warm and dry, no rash. Neuro:  Strength and sensation are intact. Psych: Normal affect.  Accessory Clinical Findings    ECG personally reviewed by me today -no EKG in office today.  Lab Results  Component Value Date   WBC 5.8 02/19/2022   HGB 9.6 (L) 02/19/2022   HCT 28.4 (L) 02/19/2022   MCV 104.0 (H) 02/19/2022   PLT 194 02/19/2022   Lab Results  Component Value Date   CREATININE 4.20 (H) 02/20/2022   BUN 32 (H) 02/20/2022   NA 135 02/20/2022   K 4.2 02/20/2022   CL 96 (L) 02/20/2022   CO2 28 02/20/2022  Lab Results  Component Value Date   ALT 20 02/19/2022   AST 24 02/19/2022   ALKPHOS 136 (H) 02/19/2022   BILITOT 0.8 02/19/2022   Lab Results  Component Value Date   CHOL 115 02/19/2022   HDL 30 (L) 02/19/2022   LDLCALC 59 02/19/2022   TRIG 132 02/19/2022   CHOLHDL 3.8 02/19/2022    Lab Results  Component Value Date   HGBA1C 5.5 02/19/2022    Assessment & Plan   1. CAD: S/p CABG x3 (LIMA-LAD, SVG, RPL, SVG-Circumflex) in 2012. Most recent cath in the setting of NSTEMI on 10/13/2021 revealed new total occlusion of saphenous vein graft to the obtuse marginal, chronic total occlusion of saphenous vein graft to PDA, patent LIMA to the apical LAD which was totaled proximally and distal to the insertion site, 70% left main, 70% followed by 80% proximal and mid LAD, 99% ostial circumflex, 70 to 80% right  coronary artery, 95% PDA and normal LV function. Medical therapy recommended. It was noted that if symptoms persisted, PCI of the RCA could be considered-though she is not an ideal candidate for cath given ESRD. She has had 3 episodes of mild dyspnea on exertion over the past 6 months, denies chest pain, overall stable. If symptoms progress could consider increasing Imdur as BP allows. Continue aspirin, Plavix,metoprolol, Imdur, Lipitor and Zetia.    2. Bilateral lower extremity edema: Most recent echo in 10/2021 showed EF 60 to 65%, normal LV function, no RWMA, mild concentric LVH, G2 DD, mild mitral valve regurgitation, moderate to severe MAC. Euvolemic and well compensated on exam. Continue Lasix per nephrology (she is on a modified schedule and takes it Thursday and Saturday only).    3. Hypertension: BP well controlled. Continue current antihypertensive regimen.    4. Hyperlipidemia: LDL was 59 in 02/2022.  Continue Lipitor, Zetia.      5. ESRD: On HD TTS. Follows with nephrology.    6. History of TIA/L carotid bruit: No recurrence. Repeat carotid Dopplers in 03/2022  showed 1 to 39% B ICA stenosis. Continue aspirin, Plavix, Lipitor, Zetia.   7. Type 2 diabetes: A1c was 5.5 in 02/2022. Monitored and managed per PCP.    8. 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  Follow-up MRI was recommended in 6 months.  Recommend follow-up with PCP.  9. Disposition: Follow-up in 6 months with Dr. Herbie Baltimore, sooner if needed.    Joylene Grapes, NP 09/27/2022, 4:54 PM

## 2022-09-27 NOTE — Patient Instructions (Signed)
Medication Instructions:  Your physician recommends that you continue on your current medications as directed. Please refer to the Current Medication list given to you today.  *If you need a refill on your cardiac medications before your next appointment, please call your pharmacy*   Lab Work: NONE ordered at this time of appointment   Testing/Procedures: NONE ordered at this time of appointment   Follow-Up: At [facility], you and your health needs are our priority.  As part of our continuing mission to provide you with exceptional heart care, we have created designated Provider Care Teams.  These Care Teams include your primary Cardiologist (physician) and Advanced Practice Providers (APPs -  Physician Assistants and Nurse Practitioners) who all work together to provide you with the care you need, when you need it.  We recommend signing up for the patient portal called "MyChart".  Sign up information is provided on this After Visit Summary.  MyChart is used to connect with patients for Virtual Visits (Telemedicine).  Patients are able to view lab/test results, encounter notes, upcoming appointments, etc.  Non-urgent messages can be sent to your provider as well.   To learn more about what you can do with MyChart, go to [email or web address].    Your next appointment:   6 month(s)  Provider:   Bryan Lemma, MD

## 2022-10-24 ENCOUNTER — Other Ambulatory Visit: Payer: Self-pay | Admitting: Cardiology

## 2022-10-27 ENCOUNTER — Ambulatory Visit (HOSPITAL_BASED_OUTPATIENT_CLINIC_OR_DEPARTMENT_OTHER)
Admission: RE | Admit: 2022-10-27 | Discharge: 2022-10-27 | Disposition: A | Discharge status: Home / Self Care | Payer: Medicare PPO | Source: Ambulatory Visit | Attending: Pulmonary Disease | Admitting: Pulmonary Disease

## 2022-10-27 DIAGNOSIS — R918 Other nonspecific abnormal finding of lung field: Secondary | ICD-10-CM | POA: Diagnosis present

## 2022-11-15 NOTE — Progress Notes (Signed)
CT Chest stable. No additional ct follow up needed from my standpoint. She should have follow up images with rad onc and med onc I believe.   Thanks,  BLI  Josephine Igo, DO  Pulmonary Critical Care 11/15/2022 9:53 AM

## 2022-11-21 ENCOUNTER — Other Ambulatory Visit: Payer: Self-pay | Admitting: Nurse Practitioner

## 2022-11-29 ENCOUNTER — Other Ambulatory Visit: Payer: Self-pay | Admitting: Cardiology

## 2022-12-01 ENCOUNTER — Telehealth: Payer: Self-pay | Admitting: Cardiology

## 2022-12-01 NOTE — Telephone Encounter (Signed)
*  STAT* If patient is at the pharmacy, call can be transferred to refill team.   1. Which medications need to be refilled? (please list name of each medication and dose if known)   nitroGLYCERIN (NITROSTAT) 0.4 MG SL tablet     2. Would you like to learn more about the convenience, safety, & potential cost savings by using the [facility]? No   3. Are you open to using the [facility] (Type [facility].) No   4. Which pharmacy/location (including street and city if local pharmacy) is medication to be sent to?  [id number] - [place], [place] - [address]     5. Do they need a 30 day or 90 day supply? 30 day

## 2022-12-01 NOTE — Telephone Encounter (Signed)
Refill faxed to the pharmacy

## 2022-12-07 ENCOUNTER — Telehealth: Payer: Self-pay | Admitting: Pulmonary Disease

## 2022-12-07 NOTE — Telephone Encounter (Signed)
Called and spoke with pt about Dr.Icard recommendations, she verbalized  understanding nfn

## 2022-12-07 NOTE — Telephone Encounter (Signed)
Patient returning call for CT results.

## 2022-12-19 IMAGING — DX DG ABDOMEN ACUTE W/ 1V CHEST
4 series · 4 of 4 positions shown · non-contrast
Comparison: None.

CLINICAL DATA: Nausea, vomiting

EXAM:
DG ABDOMEN ACUTE WITH 1 VIEW CHEST

[chest pa]
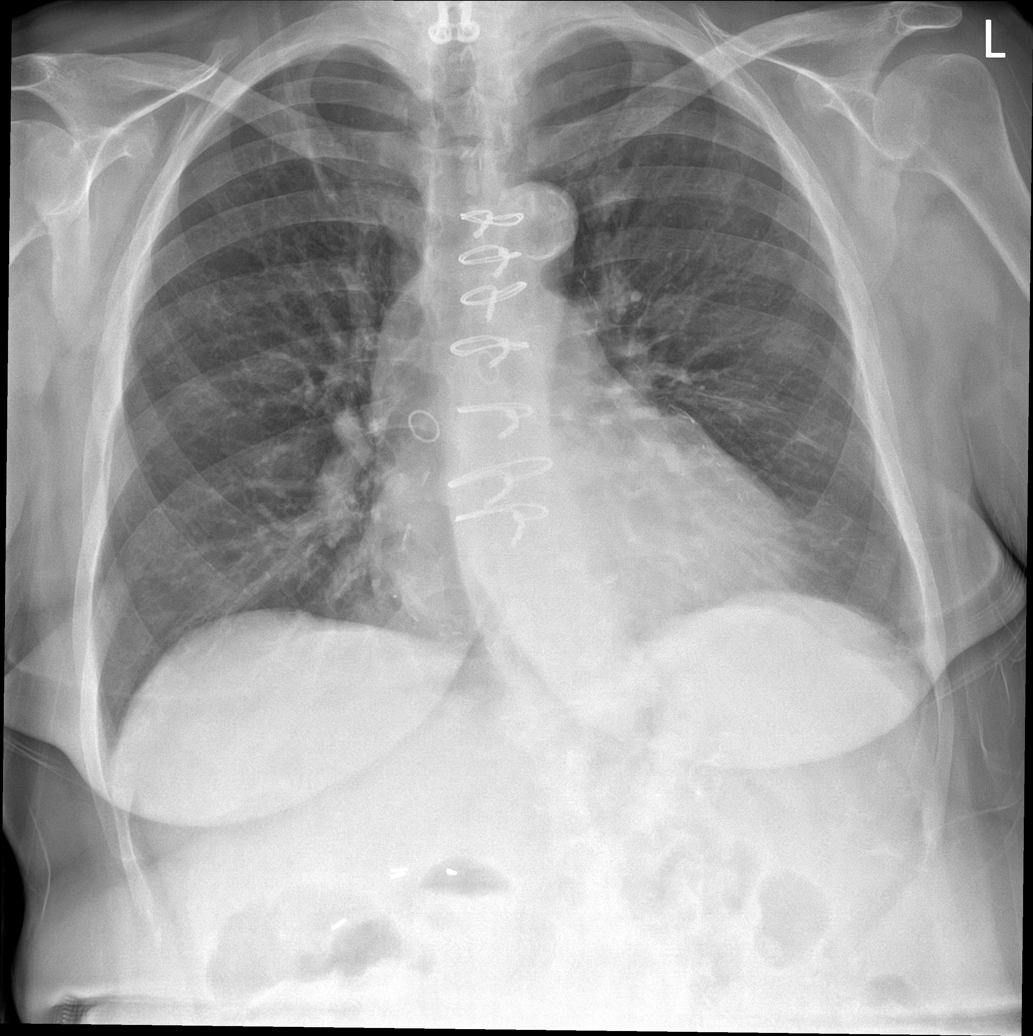

[abdomen erect]
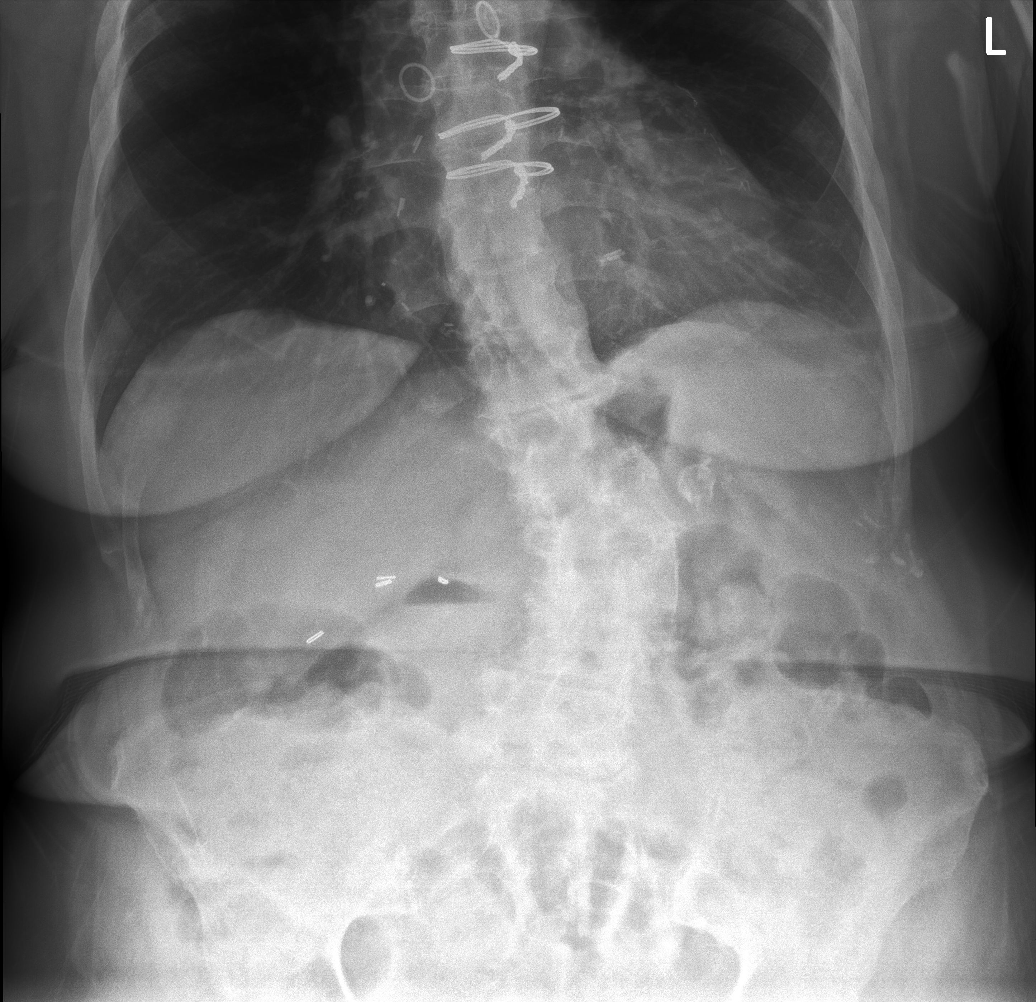

[abdomen kub (1 of 2)]
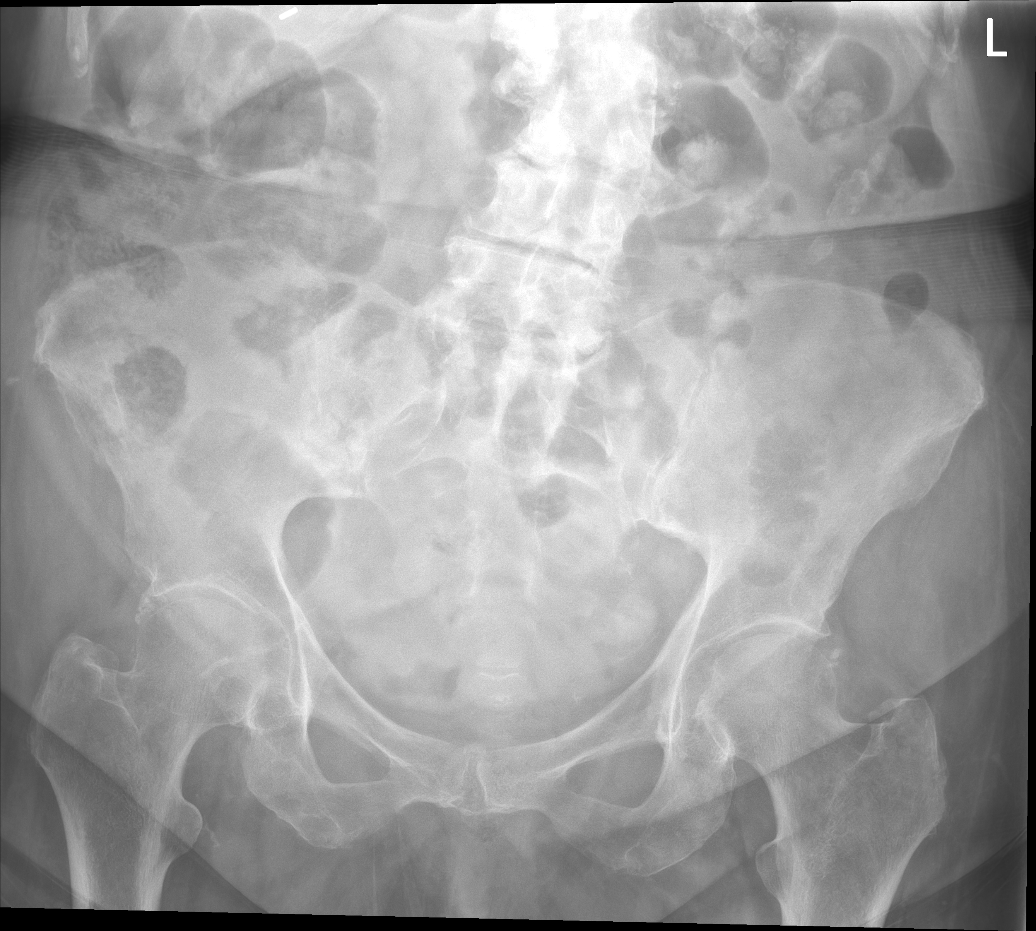

[abdomen kub (2 of 2)]
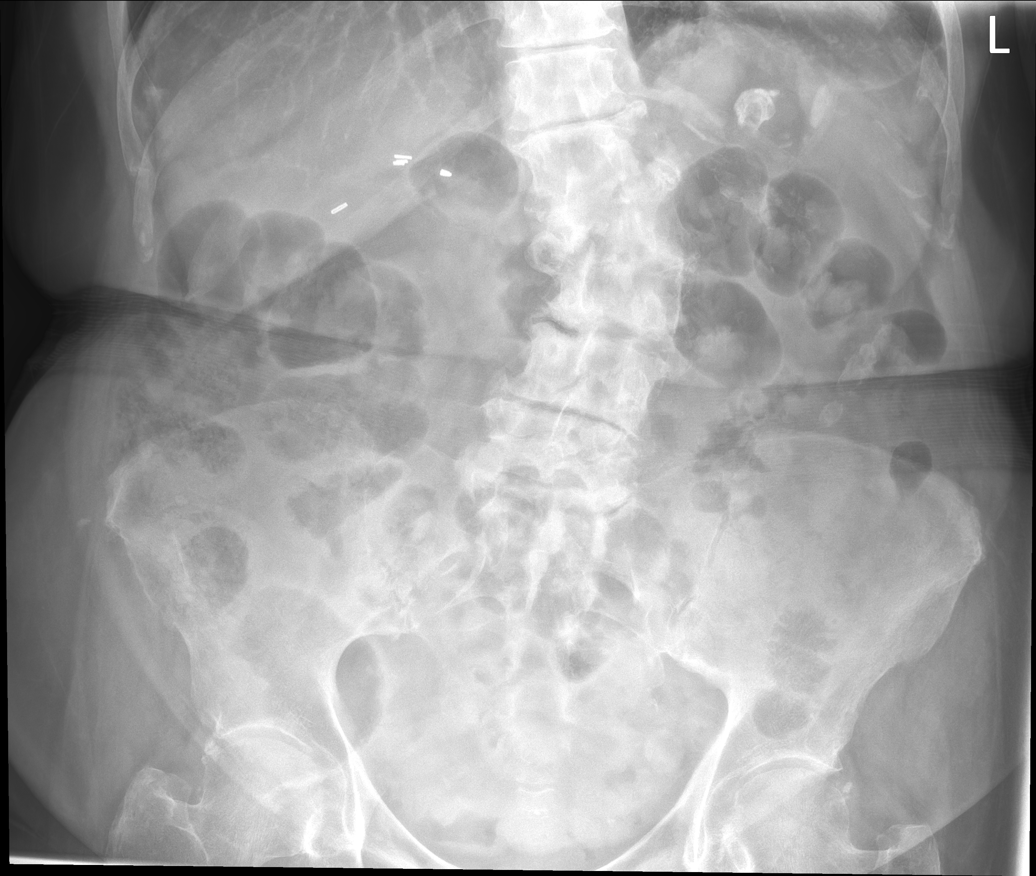

[4 of 4 positions shown; findings below may reference images not displayed]

FINDINGS: Prior CABG. Heart is borderline in size. No confluent opacities or
effusions.

Nonobstructive bowel gas pattern. Prior cholecystectomy. No
organomegaly, free air or suspicious calcification. Diffuse aortic
and branch calcifications. No aneurysm. Leftward scoliosis and
degenerative changes in the lumbar spine.
IMPRESSION: No evidence of bowel obstruction or free air.

No acute cardiopulmonary disease.

## 2023-01-29 ENCOUNTER — Other Ambulatory Visit: Payer: Self-pay | Admitting: Cardiology

## 2023-03-14 ENCOUNTER — Encounter: Payer: Self-pay | Admitting: Cardiology

## 2023-03-14 ENCOUNTER — Ambulatory Visit: Payer: Medicare PPO | Attending: Cardiology | Admitting: Cardiology

## 2023-03-14 VITALS — BP 131/70 | HR 81 | Ht 60.0 in | Wt 140.4 lb

## 2023-03-14 DIAGNOSIS — Z951 Presence of aortocoronary bypass graft: Secondary | ICD-10-CM

## 2023-03-14 DIAGNOSIS — I25708 Atherosclerosis of coronary artery bypass graft(s), unspecified, with other forms of angina pectoris: Secondary | ICD-10-CM

## 2023-03-14 DIAGNOSIS — I25119 Atherosclerotic heart disease of native coronary artery with unspecified angina pectoris: Secondary | ICD-10-CM

## 2023-03-14 DIAGNOSIS — Z992 Dependence on renal dialysis: Secondary | ICD-10-CM

## 2023-03-14 DIAGNOSIS — R0609 Other forms of dyspnea: Secondary | ICD-10-CM | POA: Diagnosis not present

## 2023-03-14 DIAGNOSIS — I25118 Atherosclerotic heart disease of native coronary artery with other forms of angina pectoris: Secondary | ICD-10-CM

## 2023-03-14 DIAGNOSIS — I214 Non-ST elevation (NSTEMI) myocardial infarction: Secondary | ICD-10-CM

## 2023-03-14 DIAGNOSIS — E785 Hyperlipidemia, unspecified: Secondary | ICD-10-CM

## 2023-03-14 DIAGNOSIS — N186 End stage renal disease: Secondary | ICD-10-CM

## 2023-03-14 DIAGNOSIS — I10 Essential (primary) hypertension: Secondary | ICD-10-CM | POA: Diagnosis not present

## 2023-03-14 DIAGNOSIS — E1122 Type 2 diabetes mellitus with diabetic chronic kidney disease: Secondary | ICD-10-CM

## 2023-03-14 MED ORDER — ISOSORBIDE MONONITRATE ER 60 MG PO TB24: ORAL_TABLET | ORAL | 3 refills | Status: DC

## 2023-03-14 MED ORDER — FUROSEMIDE 20 MG PO TABS: ORAL_TABLET | ORAL | 6 refills | Status: AC

## 2023-03-14 MED ORDER — METOPROLOL TARTRATE 50 MG PO TABS: 50.0000 mg | ORAL_TABLET | Freq: Two times a day (BID) | ORAL | 7 refills | Status: DC

## 2023-03-14 NOTE — Patient Instructions (Addendum)
 Medication Instructions:    Change in Lasix ( furosemide)  take  Mondays , Tuesdays, and Saturdays   Take your metoprolol after you return from dialysis ( on your dialysis days     Increase Imdur  ( isosorbide)   take 60 mg in the morning  and  30 mg in th evening *If you need a refill on your cardiac medications before your next appointment, please call your pharmacy*   Lab Work: Not needed     Testing/Procedures:   Will be schedule  Your physician has requested that you have an echocardiogram. Echocardiography is a painless test that uses sound waves to create images of your heart. It provides your doctor with information about the size and shape of your heart and how well your heart's chambers and valves are working. This procedure takes approximately one hour. There are no restrictions for this procedure. Please do NOT wear cologne, perfume, aftershave, or lotions (deodorant is allowed). Please arrive 15 minutes prior to your appointment time.  Please note: We ask at that you not bring children with you during ultrasound (echo/ vascular) testing. Due to room size and safety concerns, children are not allowed in the ultrasound rooms during exams. Our front office staff cannot provide observation of children in our lobby area while testing is being conducted. An adult accompanying a patient to their appointment will only be allowed in the ultrasound room at the discretion of the ultrasound technician under special circumstances. We apologize for any inconvenience.   Follow-Up: At [facility], you and your health needs are our priority.  As part of our continuing mission to provide you with exceptional heart care, we have created designated Provider Care Teams.  These Care Teams include your primary Cardiologist (physician) and Advanced Practice Providers (APPs -  Physician Assistants and Nurse Practitioners) who all work together to provide you with the care you need, when you need  it.     Your next appointment:   3 month(s)  The format for your next appointment:   In Person  Provider:   Bernadene Person, NP    Then, Bryan Lemma, MD will plan to see you again in 6 month(s).   Other

## 2023-03-14 NOTE — Progress Notes (Unsigned)
 Cardiology Office Note:  .   Date:  03/16/2023  ID:  Armanda Magic, DOB 01/21/1945, MRN [id number] PCP: Bernita Buffy  [facility] Providers Cardiologist:  Bryan Lemma, MD     Chief Complaint  Patient presents with   Shortness of Breath    Patient stated that she used Nitrostat 2 weeks ago and she has been very SOB and has had chest pains     Follow-up    25-month   Coronary Artery Disease    Has been using intermittent doses of nitroglycerin.  Also noting exertional dyspnea.    Patient Profile: .     Kamill Fulbright is a 78 y.o. female with a complex PMH noted below who presents here for 28-month follow-up at the request of Mayford Knife, Breejante J, *.  PMH CAD-CABG x 3 (LIMA-LAD, SVG-RPL, SVG-LCx)  NSTEMI December 2012 => cath with MV CAD referred for CABG  October 2023: Newly occluded SVG-OM, and CTO of SVG-PDA.  70% distal LM, 70% ostial LAD and sequential 80% in the proximal and mid LAD.  Heavily calcified 95-99 % ostial LCx and dominant OM.  Moderate mid RCA and 70-80% distal RCA disease, 95% PDA and 70% proximal PLV HTN, HLD, DM-2 ESRD-HD (started HD April 2021) Renal cell carcinoma s/p left nephrectomy Lung Carcinoid s/p XRT Distant h/o CVA    I last saw Chau in May 2022: She was doing well from cardiac standpoint.  Every now and then having some dyspnea, she noted having difficulty keeping up with her grandkids.  Struggling with her second cancer diagnosis.  Tolerating dialysis relatively well but having some fatigue associated with it.  The edema was notably improved as has the dyspnea.  Just noted occasional dyspnea spells.  No palpitations.  No chest pain.  => She was admitted October 13, 2021 with non-STEMI with catheter revealing CTO of SVG-RCA with new occlusion of the SVG-OM.  Heavily calcified subtotal occluded native LCx and diffuse disease in the native RCA.  She had 2 subsequent hospitalizations-1 shortly after discharge in October 2023  for fever-UTI, then again in February 2024 for COVID and chest pain.  Lucina Betty was last seen in September 2024 by Bernadene Person, NP-noted 3 episodes of mild exertional dyspnea and chest pain but otherwise stable.  No changes made.  Subjective  Discussed the use of AI scribe software for clinical note transcription with the patient, who gave verbal consent to proceed.  History of Present Illness   Reighlyn Elmes "Carney Bern" is a 78 year old female with coronary artery disease who presents with increased shortness of breath and chest discomfort. She is accompanied by her friend, Eunice Blase.  She experiences increased episodes of shortness of breath and occasional chest pain, primarily during exertion such as walking. These symptoms have occurred twice today, once while getting into her car and another time at the clinic. She has used nitroglycerin three to four times, with the last use being a couple of weeks ago. No orthopnea or paroxysmal nocturnal dyspnea.  She has a history of coronary artery disease with previous heart catheterization showing significant narrowing in the right coronary artery and other native arteries. She is on isosorbide mononitrate 60 mg at night and uses nitroglycerin as needed. She also takes metoprolol and adjusts the timing of her medications around dialysis sessions.  She has been on dialysis for approximately two years following kidney damage from previous kidney cancer. Dialysis is performed on Tuesdays, Thursdays, and Saturdays. She experiences low blood pressure during dialysis  and sometimes feels dizzy, particularly when walking. She takes Lasix twice a week, on Tuesdays and Saturdays.  She has a history of multiple cancers, including kidney cancer, which led to her current dialysis dependency. She also recalls a previous carcinoid tumor. Her past medical history includes a stroke and coronary artery disease. She is currently on Plavix but not aspirin. She occasionally  notices a little blood when wiping but denies significant bleeding.      Cardiovascular ROS: positive for - chest pain, dyspnea on exertion, and exercise intolerance, fatigue.  Also notes back pain that may have an activity. negative for - edema, irregular heartbeat, orthopnea, palpitations, paroxysmal nocturnal dyspnea, rapid heart rate, shortness of breath, or syncope or near syncope, TIA/CVA or amaurosis fugax.  Claudication.  ROS:  Review of Systems - Negative except symptoms are in HPI    Objective   Current Meds  Medication Sig   acetaminophen (TYLENOL) 500 MG tablet Take 1,000 mg by mouth every 6 (six) hours as needed for moderate pain.   albuterol (VENTOLIN HFA) 108 (90 Base) MCG/ACT inhaler Inhale 1-2 puffs into the lungs every 4 (four) hours as needed for wheezing or shortness of breath.   Alpha-Lipoic Acid 600 MG CAPS Take 600 mg by mouth daily.   aspirin 81 MG tablet Take 81 mg by mouth at bedtime.   benzonatate (TESSALON) 100 MG capsule Take 100-200 mg by mouth 3 (three) times daily as needed for cough.   cinacalcet (SENSIPAR) 30 MG tablet Take 30 mg by mouth daily with breakfast.   clopidogrel (PLAVIX) 75 MG tablet TAKE 1 TABLET BY MOUTH EVERY DAY   Darbepoetin Alfa (ARANESP) 60 MCG/0.3ML SOSY injection Inject 0.3 mLs (60 mcg total) into the vein every Tuesday with hemodialysis. (Patient taking differently: Inject 60 mcg into the vein See admin instructions. Given at dialysis)   estradiol (ESTRACE) 0.1 MG/GM vaginal cream Place 1 Applicatorful vaginally 3 (three) times a week.   ezetimibe (ZETIA) 10 MG tablet TAKE 1 TABLET BY MOUTH EVERY DAY   gabapentin (NEURONTIN) 100 MG capsule Take 100 mg by mouth 2 (two) times daily.   lidocaine-prilocaine (EMLA) cream Apply 1 application topically Every Tuesday,Thursday,and Saturday with dialysis.   loperamide (IMODIUM A-D) 2 MG tablet Take 4 mg by mouth as needed for diarrhea or loose stools.   loratadine (CLARITIN) 10 MG tablet Take  10 mg by mouth daily.   magnesium oxide (MAG-OX) 400 MG tablet Take 400 mg by mouth daily.   nitroGLYCERIN (NITROSTAT) 0.4 MG SL tablet PLACE 1 TABLET UNDER THE TONGUE EVERY 5 MINUTES AS NEEDED FOR CHEST PAIN.   Omega-3 Fatty Acids (FISH OIL) 1200 MG CAPS Take 2,400 mg by mouth in the morning and at bedtime.   omeprazole (PRILOSEC) 40 MG capsule Take 40 mg by mouth daily before breakfast.   Probiotic Product (ALIGN) 4 MG CAPS Take 4 mg by mouth daily.   promethazine-dextromethorphan (PROMETHAZINE-DM) 6.25-15 MG/5ML syrup Take 5 mLs by mouth 4 (four) times daily as needed for cough.   pyridOXINE (VITAMIN B-6) 100 MG tablet Take 200 mg by mouth daily.    sevelamer carbonate (RENVELA) 800 MG tablet Take 800 mg by mouth See admin instructions. Take 800 mg with each meal and each snack   []  furosemide (LASIX) 20 MG tablet TAKE 1 TABLET ON TUESDAYS AND SATURDAYS. Pt needs office visit for further refills.   []  Dose increased isosorbide mononitrate (IMDUR) 60 MG 24 hr tablet TAKE 1 TABLET BY MOUTH EVERY DAY   [  Refilled] metoprolol tartrate (LOPRESSOR) 50 MG tablet TAKE 1 TABLET BY MOUTH TWICE A DAY    Studies Reviewed: [name]   EKG Interpretation Date/Time:  Monday March 14 2023 10:20:45 EST Ventricular Rate:  81 PR Interval:  158 QRS Duration:  82 QT Interval:  408 QTC Calculation: 473 R Axis:   59  Text Interpretation: Normal sinus rhythm ST & T wave abnormality, consider lateral ischemia When compared with ECG of 18-Feb-2022 21:13, ST less depressed in Lateral leads Confirmed by Bryan Lemma ([id number]) on 03/14/2023 10:26:49 AM    ECHO (10/2021): EF 60 to 65%.  Normal function with no RWMA.  GR 2 DD.  Moderate MAC with mild MR.  AOV sclerosis with no stenosis.  Normal RAP. CATH (10/2021): Newly occluded SVG-OM with known CTO of SVG-RCA.  Patent LIMA-dLAD.  Films personally reviewed   Carotid Dopplers 04/07/2022: Bilateral ICA velocities C/W1 FFR 9% stenosis.  Antegrade flow bilateral vertebral  arteries.  Right subclavian artery normal, left clavian artery and increased velocity through the fistula.  Labs (from PCP-[facility]) 02/19/2022: TC 150, TG 130, HDL 30, LDL 59.  A1c 5.5.  Hgb 9.6; Cr 4.2, K+ 4.2.  Risk Assessment/Calculations:           Physical Exam:   VS:  BP 131/70   Pulse 81   Ht 5' (1.524 m)   Wt 140 lb 6.4 oz (63.7 kg)   SpO2 (!) 86%   BMI 27.42 kg/m    Wt Readings from Last 3 Encounters:  03/14/23 140 lb 6.4 oz (63.7 kg)  09/27/22 144 lb (65.3 kg)  03/29/22 140 lb 12.8 oz (63.9 kg)    GEN: Well nourished, well developed in no acute distress; relatively healthy appearing NECK: No JVD; No carotid bruits CARDIAC:  RRR, Normal S1, S2;; harsh 1-2/6 SEM at RUSB (difficult to tell if this is true murmur versus radiated from fistula); soft S4 gallop.  No rubs. RESPIRATORY:  Clear to auscultation without rales, wheezing or rhonchi ; nonlabored, good air movement. ABDOMEN: Soft, non-tender, non-distended EXTREMITIES:  No edema; No deformity     ASSESSMENT AND PLAN: .    Problem List Items Addressed This Visit       Cardiology Problems   CAD (coronary artery disease) of artery bypass graft (Chronic)   2 of 3 grafts occluded.  The SVG-PDA graft had been known to be occluded and now the SVG to OM is also occluded. Living off limb in the distal LAD.      Relevant Medications   furosemide (LASIX) 20 MG tablet   isosorbide mononitrate (IMDUR) 60 MG 24 hr tablet   metoprolol tartrate (LOPRESSOR) 50 MG tablet   Other Relevant Orders   ECHOCARDIOGRAM COMPLETE   Coronary artery disease involving native coronary artery of native heart with angina pectoris (HCC) - Primary (Chronic)   Increased episodes of shortness of breath, possibly related to fluid overload pre-dialysis. No significant chest pain reported. EKG unchanged. -Remains on aspirin 81 mg and Plavix 75 mg for combination of CAD and history of CVA. => Okay to stop aspirin. Okay to hold Plavix 5 to 7 days preop  for surgeries or procedures. -Increase Isosorbide to 60mg  in the morning and 30mg  at night. -Take Metoprolol after dialysis on dialysis days to avoid hypotension on dialysis days. -If BP tolerates, can consider adding amlodipine. -Add Lasix on Monday mornings to help with excess volume levels on predialysis days. -Order echocardiogram to reassess ejection fraction. -=> Depending on results, could potentially consider  invasive treatment if unable to control symptoms otherwise.  We      Relevant Medications   furosemide (LASIX) 20 MG tablet   isosorbide mononitrate (IMDUR) 60 MG 24 hr tablet   metoprolol tartrate (LOPRESSOR) 50 MG tablet   Other Relevant Orders   ECHOCARDIOGRAM COMPLETE   Essential hypertension (Chronic)   Blood pressure berry well-controlled on current dose of 50 mg twice daily Lopressor.  No longer on ARB or ACE inhibitor due to renal failure.  She is on 60 mg of Imdur. -Continue 50 mg daily Lopressor, low threshold to add amlodipine for additional antihypertensive/antianginal benefit.      Relevant Medications   furosemide (LASIX) 20 MG tablet   isosorbide mononitrate (IMDUR) 60 MG 24 hr tablet   metoprolol tartrate (LOPRESSOR) 50 MG tablet   Other Relevant Orders   EKG 12-Lead (Completed)   Hyperlipidemia with target low density lipoprotein (LDL) cholesterol less than 55 mg/dL (Chronic)   Labs have not been checked since February of last year.  She should be due for follow-up labs soon with PCP. Currently on 80 mg atorvastatin +10 mg ezetimibe. - With most recent LDL 59, continue current medications pending results of labs.  A1c last February was 5.5-again labs pending. -Diabetes managed by PCP but does not currently appear to be on any medications.  Simply controlled with diet.      Relevant Medications   furosemide (LASIX) 20 MG tablet   isosorbide mononitrate (IMDUR) 60 MG 24 hr tablet   metoprolol tartrate (LOPRESSOR) 50 MG tablet   NSTEMI (non-ST  elevated myocardial infarction) (HCC) (Chronic)   She has had 2 non-STEMI's-originally in December 2012 and then again in October 2023.  Unfortunately the most recent MI was as result of new occlusion of SVG-OM and associated with CTO of the SVG to PDA and diffuse native vessel disease.  The only thing notably patent was LIMA to distal LAD.  No obvious PCI options, although the distal RCA and PDA would be potential options.      Relevant Medications   furosemide (LASIX) 20 MG tablet   isosorbide mononitrate (IMDUR) 60 MG 24 hr tablet   metoprolol tartrate (LOPRESSOR) 50 MG tablet     Other   DOE (dyspnea on exertion)   Relevant Orders   ECHOCARDIOGRAM COMPLETE   ESRD on hemodialysis (HCC) (Chronic)   Suggested alternating dosing intervals for dialysis. -Take Metoprolol after dialysis on dialysis days. -Take additional dose of furosemide on Mondays as well as Tuesdays and Saturdays.      S/P CABG x 3 (Chronic)   Type 2 diabetes mellitus with renal manifestations, controlled (HCC) (Chronic)    Will attempt to treat medically with increased dose of nitrate and possibly considering amlodipine.  If we are not able to adequately treat symptoms, could consider PCI of the RCA-PDA.     Follow-Up: Return in about 3 months (around 06/14/2023) for Alternate 3 months with APP Bernadene Person, NP), 6 month with MD.  Total time spent: 36 min spent with patient + 28 min spent pre--charting,  = 64 min I spent 64 minutes in the care of Armanda Magic today including reviewing outside labs from PCP via [facility] (2 minutes), reviewing studies (8 minutes-personally reviewed cath films), face to face time discussing treatment options (36 minutes), reviewing records from APP notes, hospitalizations both H&P's and discharge summaries (7 minutes), 11 minutes dictating, and documenting in the encounter.    Signed, Marykay Lex, MD, MS Bryan Lemma, M.D., M.S.  Interventional Cardiologist  [facility]   Pager # [phone] Phone # [phone] [address] [place], [place] [address]

## 2023-03-16 ENCOUNTER — Encounter: Payer: Self-pay | Admitting: Cardiology

## 2023-03-16 NOTE — Assessment & Plan Note (Addendum)
 She has had 2 non-STEMI's-originally in December 2012 and then again in October 2023.  Unfortunately the most recent MI was as result of new occlusion of SVG-OM and associated with CTO of the SVG to PDA and diffuse native vessel disease.  The only thing notably patent was LIMA to distal LAD.  No obvious PCI options, although the distal RCA and PDA would be potential options.

## 2023-03-16 NOTE — Assessment & Plan Note (Signed)
 Blood pressure berry well-controlled on current dose of 50 mg twice daily Lopressor.  No longer on ARB or ACE inhibitor due to renal failure.  She is on 60 mg of Imdur. -Continue 50 mg daily Lopressor, low threshold to add amlodipine for additional antihypertensive/antianginal benefit.

## 2023-03-16 NOTE — Assessment & Plan Note (Signed)
 Labs have not been checked since February of last year.  She should be due for follow-up labs soon with PCP. Currently on 80 mg atorvastatin +10 mg ezetimibe. - With most recent LDL 59, continue current medications pending results of labs.  A1c last February was 5.5-again labs pending. -Diabetes managed by PCP but does not currently appear to be on any medications.  Simply controlled with diet.

## 2023-03-16 NOTE — Assessment & Plan Note (Signed)
 Suggested alternating dosing intervals for dialysis. -Take Metoprolol after dialysis on dialysis days. -Take additional dose of furosemide on Mondays as well as Tuesdays and Saturdays.

## 2023-03-16 NOTE — Assessment & Plan Note (Signed)
 Increased episodes of shortness of breath, possibly related to fluid overload pre-dialysis. No significant chest pain reported. EKG unchanged. -Remains on aspirin 81 mg and Plavix 75 mg for combination of CAD and history of CVA. => Okay to stop aspirin. Okay to hold Plavix 5 to 7 days preop for surgeries or procedures. -Increase Isosorbide to 60mg  in the morning and 30mg  at night. -Take Metoprolol after dialysis on dialysis days to avoid hypotension on dialysis days. -If BP tolerates, can consider adding amlodipine. -Add Lasix on Monday mornings to help with excess volume levels on predialysis days. -Order echocardiogram to reassess ejection fraction. -=> Depending on results, could potentially consider invasive treatment if unable to control symptoms otherwise.  We

## 2023-03-16 NOTE — Assessment & Plan Note (Signed)
 2 of 3 grafts occluded.  The SVG-PDA graft had been known to be occluded and now the SVG to OM is also occluded. Living off limb in the distal LAD.

## 2023-03-18 ENCOUNTER — Telehealth: Payer: Self-pay | Admitting: Cardiology

## 2023-03-18 MED ORDER — ISOSORBIDE MONONITRATE ER 30 MG PO TB24
ORAL_TABLET | ORAL | 3 refills | Status: AC
Start: 2023-03-18 — End: ?

## 2023-03-18 NOTE — Telephone Encounter (Signed)
 Spoke to daughter  Correction was made to direction and new prescription sent to pharmacy   Prescription should read   60 mg  of Imdur in the morning and 30 mg  in the evening  New prescription will read take 2 tablets of 30 mg  ( equal 60 mg ) and 30 mg  in the evening.  It is okay to cut  60 mg tablet  to equal 30 mg  dose. May use medication that patient currently has.   Daughter voiced understanding, she states she is now taking over  administering medication for both her patients.

## 2023-03-18 NOTE — Telephone Encounter (Signed)
 Pt c/o medication issue:  1. Name of Medication: Isosorbide  2. How are you currently taking this medication (dosage and times per day)?   3. Are you having a reaction (difficulty breathing--STAT)?   4. What is your medication issue? Question about the correct dose of her Isosorbide

## 2023-03-18 NOTE — Addendum Note (Signed)
 Addended by: Tobin Chad on: 03/18/2023 01:25 PM   Modules accepted: Orders

## 2023-03-18 NOTE — Telephone Encounter (Signed)
Daughter returned RN's call. 

## 2023-03-18 NOTE — Telephone Encounter (Signed)
 Per last office visit  Patient was to increase Imdur ( isosorbide) take 60 mg in the morning and 30 mg in th evening     Left message on [facility] voicemail of the above directions Any question may call back.

## 2023-04-08 ENCOUNTER — Ambulatory Visit (HOSPITAL_COMMUNITY): Attending: Internal Medicine

## 2023-04-08 DIAGNOSIS — I25708 Atherosclerosis of coronary artery bypass graft(s), unspecified, with other forms of angina pectoris: Secondary | ICD-10-CM | POA: Diagnosis present

## 2023-04-08 DIAGNOSIS — R0609 Other forms of dyspnea: Secondary | ICD-10-CM | POA: Insufficient documentation

## 2023-04-08 DIAGNOSIS — I25119 Atherosclerotic heart disease of native coronary artery with unspecified angina pectoris: Secondary | ICD-10-CM | POA: Insufficient documentation

## 2023-04-08 LAB — ECHOCARDIOGRAM COMPLETE
Area-P 1/2: 3.5 cm2
MV M vel: 4.75 m/s
MV Peak grad: 90.3 mmHg
MV VTI: 1.7 cm2
S' Lateral: 2.9 cm

## 2023-04-10 ENCOUNTER — Encounter: Payer: Self-pay | Admitting: Cardiology

## 2023-04-11 NOTE — Telephone Encounter (Signed)
 Appointment made via phone call to our office. April 20, 2023 at 10:55 am with Bernadene Person NP  [facility]

## 2023-04-12 NOTE — Telephone Encounter (Signed)
 Great.  Thank you.  I am adding Irving Burton to this text line.  Bryan Lemma, MD

## 2023-04-20 ENCOUNTER — Ambulatory Visit: Attending: Nurse Practitioner | Admitting: Nurse Practitioner

## 2023-04-20 ENCOUNTER — Encounter: Payer: Self-pay | Admitting: Nurse Practitioner

## 2023-04-20 VITALS — BP 124/62 | HR 85 | Ht 60.0 in | Wt 134.8 lb

## 2023-04-20 DIAGNOSIS — N185 Chronic kidney disease, stage 5: Secondary | ICD-10-CM

## 2023-04-20 DIAGNOSIS — I6523 Occlusion and stenosis of bilateral carotid arteries: Secondary | ICD-10-CM

## 2023-04-20 DIAGNOSIS — E1122 Type 2 diabetes mellitus with diabetic chronic kidney disease: Secondary | ICD-10-CM

## 2023-04-20 DIAGNOSIS — I340 Nonrheumatic mitral (valve) insufficiency: Secondary | ICD-10-CM

## 2023-04-20 DIAGNOSIS — I2510 Atherosclerotic heart disease of native coronary artery without angina pectoris: Secondary | ICD-10-CM

## 2023-04-20 DIAGNOSIS — Z8673 Personal history of transient ischemic attack (TIA), and cerebral infarction without residual deficits: Secondary | ICD-10-CM

## 2023-04-20 DIAGNOSIS — Z951 Presence of aortocoronary bypass graft: Secondary | ICD-10-CM | POA: Diagnosis not present

## 2023-04-20 DIAGNOSIS — N186 End stage renal disease: Secondary | ICD-10-CM

## 2023-04-20 DIAGNOSIS — R600 Localized edema: Secondary | ICD-10-CM | POA: Diagnosis not present

## 2023-04-20 DIAGNOSIS — I10 Essential (primary) hypertension: Secondary | ICD-10-CM

## 2023-04-20 DIAGNOSIS — Z992 Dependence on renal dialysis: Secondary | ICD-10-CM

## 2023-04-20 DIAGNOSIS — E785 Hyperlipidemia, unspecified: Secondary | ICD-10-CM

## 2023-04-20 LAB — CBC

## 2023-04-20 NOTE — H&P (View-Only) (Signed)
 Office Visit    Patient Name: Dana Jackson Date of Encounter: 04/20/2023  Primary Care Provider:  Jenell Mirza, PA-C Primary Cardiologist:  Randene Bustard, MD  Chief Complaint    78 year old female with a history of CAD, mitral valve regurgitation, hypertension, hyperlipidemia, ESRD on HD, TIA, renal cell carcinoma s/p partial left nephrectomy, lung cancer s/p radiation therapy, and type 2 diabetes who presents for follow-up related to CAD and mitral valve regurgitation.   Past Medical History    Past Medical History:  Diagnosis Date   Anemia    Arthritis    hands   Asthma    childhood   Bacteremia due to Escherichia coli 06/2013   Currently being treated with anti-biotics   CAD in native artery 12/2010   a) Cath for exertional angina & EKG changes: 40% LM, 80% mid LAD.  95% ost cX, 80-90% PDA --> CABG X3; b) Post CABG CATH for + Myoview with basal anterior ischemia -> Ost LAD 80% (diffuse) then 100% after SP2, RI 70% (too small for PCI), Ost-prox Cx 99% & OM1 90%, OstrPDA 80% (small); Occluded SVG-rPDA.  Patent LIMA-dLAD, SVG-OM: culprit ~ p-m LAD pre-LIMA & RI - not good PCI target --> Med Rx   CKD (chronic kidney disease) stage 3, GFR 30-59 ml/min (HCC)    Degenerative disc disease, cervical    Degenerative disc disease, cervical [722.4]   Diabetes mellitus without complication (HCC)    Type II   Dyspnea    Endometrial cancer (HCC) 11/2013   Treated with TAH with pelvic lymphadenectomy followed by radiation and chemotherapy   GERD (gastroesophageal reflux disease)    Hearing loss    bilateral - no hearing aids   Heart murmur    History of asthma     childhood   History of blood transfusion    History of pneumonia    "2-3 times"   History of unstable angina November/December 2012   T wave inversions in inferolateral leads.  No stress test performed.   Hyperlipidemia LDL goal <70    Hypertension, essential, benign    Neuropathy    hands   Pneumonia 2018    Renal cell carcinoma (HCC) 11/18/2008   T2aNx s/p partial left nephrectomy   S/P CABG x 3 12/2010   LIMA-LAD, SVG RPL, SVG-Circumflex   Stroke (HCC)    TIA- no residual    TIA (transient ischemic attack) 1992 &  2010   Past Surgical History:  Procedure Laterality Date   ANTERIOR CERVICAL DECOMP/DISCECTOMY FUSION  10/11/2005   multi-level   AV FISTULA PLACEMENT Left 08/03/2017   Procedure: ARTERIOVENOUS (AV) FISTULA CREATION LEFT ARM;  Surgeon: Mayo Speck, MD;  Location: [facility];  Service: Vascular;  Laterality: Left;   BRONCHIAL BIOPSY  12/30/2020   Procedure: BRONCHIAL BIOPSIES;  Surgeon: Prudy Brownie, DO;  Location: [facility];  Service: Pulmonary;;   BRONCHIAL BIOPSY  02/03/2021   Procedure: BRONCHIAL BIOPSIES;  Surgeon: Prudy Brownie, DO;  Location: [facility];  Service: Pulmonary;;   BRONCHIAL BRUSHINGS  12/30/2020   Procedure: BRONCHIAL BRUSHINGS;  Surgeon: Prudy Brownie, DO;  Location: [facility];  Service: Pulmonary;;   BRONCHIAL BRUSHINGS  02/03/2021   Procedure: BRONCHIAL BRUSHINGS;  Surgeon: Prudy Brownie, DO;  Location: [facility];  Service: Pulmonary;;   BRONCHIAL NEEDLE ASPIRATION BIOPSY  12/30/2020   Procedure: BRONCHIAL NEEDLE ASPIRATION BIOPSIES;  Surgeon: Prudy Brownie, DO;  Location: [facility];  Service: Pulmonary;;   BRONCHIAL NEEDLE ASPIRATION  BIOPSY  02/03/2021   Procedure: BRONCHIAL NEEDLE ASPIRATION BIOPSIES;  Surgeon: Josephine Igo, DO;  Location: [facility];  Service: Pulmonary;;   CARDIAC CATHETERIZATION  12/24/10   40% left main, 80% mid LAD, 95% ostial circumflex, 80-90% PDA.   CARDIAC CATHETERIZATION N/A 07/01/2014   Procedure: Left Heart Cath and Cors/Grafts Angiography;  Surgeon: Marykay Lex, MD;  Location: [facility]:  For Abn Nuc @ [facility]: Ost LAD 80%, mid LAD 100% after S/P 2, 70% RI (no PCI target), ost-prox Cx 99%, OM1 90%. Ost rPDA 80% (~ pre-CABG), occluded SVG-rPDA, patent LIMA-dLAD, SVG OM; potential culprit for  abn Nuc scan = pLAD Dz, small RI 70% or PDA -> med Rx   CAROTID ENDARTERECTOMY Left    CHOLECYSTECTOMY  ~ 1971   COLONOSCOPY     CORONARY ARTERY BYPASS GRAFT  12/25/2010   Procedure: CORONARY ARTERY BYPASS GRAFTING (CABGX3 - LIMA-LAD, SVG RPL, SVG-Circumflex);  Surgeon: Purcell Nails, MD;  Location: [facility];  Service: Open Heart Surgery;  Laterality: N/A;   DILATATION & CURETTAGE/HYSTEROSCOPY WITH MYOSURE N/A 11/02/2013   Procedure: DILATATION & CURETTAGE/HYSTEROSCOPY WITH MYOSURE ABLATION;  Surgeon: Leslie Andrea, MD;  Location: [facility];  Service: Gynecology;  Laterality: N/A;   DOPPLER ECHOCARDIOGRAPHY  12/08/2010   EF =>55%,MILD CONCENTRIC LEFT VENTRICULAR HYPERTROPHY   EYE SURGERY Bilateral    Cataract   IR DIALY SHUNT INTRO NEEDLE/INTRACATH INITIAL W/IMG LEFT Left 04/24/2019   IR FLUORO GUIDE CV LINE LEFT  04/26/2019   IR US GUIDE VASC ACCESS LEFT  04/24/2019   IR US GUIDE VASC ACCESS LEFT  04/26/2019   LEFT HEART CATH AND CORS/GRAFTS ANGIOGRAPHY N/A 10/13/2021   Procedure: LEFT HEART CATH AND CORS/GRAFTS ANGIOGRAPHY;  Surgeon: Lyn Records, MD;  [facility];  Service: Cardiovascular;  Laterality: N/A;   LEFT HEART CATHETERIZATION WITH CORONARY ANGIOGRAM N/A 12/24/2010   Procedure: LEFT HEART CATHETERIZATION WITH CORONARY ANGIOGRAM;  Surgeon: Marykay Lex, MD;  Location: [facility] CATH LAB;  Service: Cardiovascular;  Laterality: N/A;   NM MYOCAR SINGLE W/SPECT  07/26/2007   EF 79%, LEFT VENT.FUNCTION NORMAL   PARTIAL NEPHRECTOMY Left 11/18/2008   left partial nephrectomy for renal cell CA   PET Myocardial Perfusion Scan  06/13/2014   At [facility]: Moderate size, mild severity completely reversible defect involving the basal anterior, mid anterior and apical anterior segments consistent with ischemia. EF 65% with normal global function.   TONSILLECTOMY     "in college"   TOTAL ABDOMINAL HYSTERECTOMY  November 2015    At [facility]: Robotic procedure with pelvic  lymphadenectomy   TRANSTHORACIC ECHOCARDIOGRAM  06/13/2014   At [facility]: EF 60-65%. Grade 1 diastolic dysfunction. Mild MR. Aortic sclerosis. Moderate pulmonary hypertension   TUBAL LIGATION  ~ 1984   UPPER GI ENDOSCOPY     VIDEO BRONCHOSCOPY WITH RADIAL ENDOBRONCHIAL ULTRASOUND  12/30/2020   Procedure: RADIAL ENDOBRONCHIAL ULTRASOUND;  Surgeon: Josephine Igo, DO;  Location: [facility];  Service: Pulmonary;;   VIDEO BRONCHOSCOPY WITH RADIAL ENDOBRONCHIAL ULTRASOUND  02/03/2021   Procedure: RADIAL ENDOBRONCHIAL ULTRASOUND;  Surgeon: Josephine Igo, DO;  Location: [facility];  Service: Pulmonary;;   WISDOM TOOTH EXTRACTION      Allergies  Allergies  Allergen Reactions   Sulfa Antibiotics Other (See Comments)    "AKI"    Erythromycin Itching   Pravastatin Other (See Comments)    Unknown reaction   Simvastatin Other (See Comments)    Reaction  not known   Codeine Nausea Only and Other (See Comments)    Tolerate Hycodan   Gemfibrozil Itching     Labs/Other Studies Reviewed    The following studies were reviewed today:  Cardiac Studies & Procedures   ______________________________________________________________________________________________ CARDIAC CATHETERIZATION  CARDIAC CATHETERIZATION 10/13/2021  Narrative CONCLUSIONS: New culprit total occlusion of SVG to the obtuse marginal Chronic total occlusion of SVG to PDA Patent LIMA to apical LAD which is totally occluded proximal to the graft insertion site and in the apical segment beyond the graft and insertion site. 70% distal left main. 70% ostial LAD and sequential 80% stenoses in the proximal and mid LAD Ostial 95 to 99% heavily calcified circumflex with severe diffuse disease in the dominant obtuse marginal. Moderate mid RCA disease and 70 to 80% distal disease.  95% stenosis in the PDA and 70% proximal LV branch disease. Preserved LV function with EF approximately 50 to 60% with EDP 12  mmHg.   RECOMMENDATIONS:  There are no good PCI or surgical options.  We will have other team members to look at pictures and give their opinion.  If refractory angina unresponsive to medical therapy we could treat the right coronary however it is the least severely diseased vessel of the 3 major epicardial coronaries.  I do not believe repeat surgery is an option at her age and with end-stage kidney disease.  Findings Coronary Findings Diagnostic  Dominance: Right  Left Main There is severe diffuse disease throughout the vessel. Ost LM to Ost LAD lesion is 70% stenosed.  Left Anterior Descending There is severe diffuse disease throughout the vessel. Prox LAD to Mid LAD lesion is 70% stenosed. Mid LAD lesion is 80% stenosed. Dist LAD-1 lesion is 100% stenosed. Dist LAD-2 lesion is 100% stenosed.  Left Circumflex There is moderate diffuse disease throughout the vessel. Ost Cx to Prox Cx lesion is 99% stenosed.  First Obtuse Marginal Branch 1st Mrg lesion is 100% stenosed.  Second Obtuse Marginal Branch There is mild disease in the vessel. 2nd Mrg-1 lesion is 95% stenosed. 2nd Mrg-2 lesion is 90% stenosed.  Right Coronary Artery There is moderate diffuse disease throughout the vessel. Mid RCA lesion is 60% stenosed. Dist RCA lesion is 70% stenosed.  Right Posterior Descending Artery RPDA lesion is 99% stenosed.  First Right Posterolateral Branch 1st RPL lesion is 90% stenosed.  Graft To RPDA Origin to Prox Graft lesion is 100% stenosed.  Graft To 2nd Mrg Origin lesion is 100% stenosed.  LIMA Graft To Dist LAD  Intervention  No interventions have been documented.   CARDIAC CATHETERIZATION  CARDIAC CATHETERIZATION 07/01/2014  Narrative Images from the original result were not included. 1. Severe multivessel native CAD with 2 of 3 grafts patent and occluded SVG-rPDA. Patent LIMA-~dLAD, SVG-OM. Occluded SVG-rPDA at the origin 2. Ost LAD lesion, 80% stenosed.  Mid LAD lesion, 100% stenosed after 2nd septal perforator. The proximal-mid LAD is at least moderately diseased 3. Ramus lesion, 70% stenosed - small caliber, non-PCI amenable 4. Ost Cx to Prox Cx lesion, 99% stenosed. 1st Mrg lesion, 90% stenosed. 5. Ost RPDA to RPDA lesion, 80% stenosed. Similar to pre-CABG. < 2 mm vessel. 6. Potential culprit for abnormal Nuclear Stress Test is the diffusely diseased proximal-mid LAD with diseased septal perforators from LAD & rPDA  No PCI target available to treat the "anterior ischemia" in an asymptomatic patient.  Recommendations:  Post Femoral cath with sheath removal  Aggressive IV fluid hydration overnight  Overnight - outpatient in  bed for post cath hydration given CKD.  Continue current med Rx.  Will need to check CMP at the end of the week.   Arleen Lacer, M.D., M.S. Interventional Cardiologist  Pager # [phone]  Findings Coronary Findings Diagnostic  Dominance: Right  Left Main The vessel is large .  Left Anterior Descending There is severe the vessel.  calcified chronic total occlusion .  First Septal Branch The vessel is small in size.  Second Septal Branch The vessel is small in size.  Ramus Intermedius The vessel is small . tubular eccentric .   Tapered to small branch.  Lateral Ramus Intermedius The vessel is small in size.  Left Circumflex The vessel is large . diffuse located at the major branch . discrete .  At this point, the vessel is small caliber  First Obtuse Marginal Branch tubular .  Competitive flow from SVG  Lateral First Obtuse Marginal Branch The vessel is small in size.  Third Obtuse Marginal Branch The vessel is small in size.  Right Coronary Artery The vessel is large . tubular .  Right Posterior Descending Artery The vessel is small in size. diffuse .  Previously grafted, small caliber vessel.  &lt; 2 mm  Right Posterior Atrioventricular Artery The vessel is moderate  in size.  First Right Posterolateral Branch The vessel is moderate in size.  Second Right Posterolateral Branch The vessel is small in size.  Third Right Posterolateral Branch The vessel is moderate in size.  LIMA LIMA Graft To Dist LAD LIMA is and is anatomically normal. Tortuous graft to mid-distal LAD.  Single Graft Graft To 1st Mrg SVG is normal in caliber. There is competitive flow.  Single Graft Graft To RPDA SVG chronic total occlusion .  Intervention  No interventions have been documented.   STRESS TESTS  MYOCARDIAL PERFUSION IMAGING 07/26/2007   ECHOCARDIOGRAM  ECHOCARDIOGRAM COMPLETE 04/08/2023  Narrative ECHOCARDIOGRAM REPORT    Patient Name:   MILEIGH TILLEY Date of Exam: 04/08/2023 Medical Rec #:  [id number]      Height:       60.0 in Accession #:    [id number]     Weight:       140.4 lb Date of Birth:  25-Mar-1945     BSA:          1.606 m Patient Age:    83 years       BP:           131/70 mmHg Patient Gender: F              HR:           81 bpm. Exam Location:  [address]  Procedure: 2D Echo, Cardiac Doppler and Color Doppler (Both Spectral and Color Flow Doppler were utilized during procedure).  Indications:    I25.10 CAD  History:        Patient has prior history of Echocardiogram examinations, most recent 10/13/2021. CAD, Prior CABG, Stroke, Signs/Symptoms:Murmur, Shortness of Breath and Dyspnea; Risk Factors:Diabetes.  Sonographer:    Brigid Canada RDCS Referring Phys: 44 DAVID W HARDING  IMPRESSIONS   1. Left ventricular ejection fraction, by estimation, is 60 to 65%. Left ventricular ejection fraction by PLAX is 63 %. The left ventricle has normal function. The left ventricle has no regional wall motion abnormalities. Left ventricular diastolic parameters are consistent with Grade II diastolic dysfunction (pseudonormalization). Elevated left ventricular end-diastolic pressure. 2. Right ventricular systolic function is  mildly reduced. The right ventricular size  is normal. 3. The mitral valve is degenerative. Moderate to more likely severe, eccentric mitral valve regurgitation with splay artifact directed toward the interatrial septum - swirling of the regurgitant jet is noted in the left atrium. Mild mitral stenosis. The mean mitral valve gradient is 5.0 mmHg with average heart rate of 81 bpm. 4. The aortic valve is tricuspid. Aortic valve regurgitation is not visualized. Aortic valve sclerosis/calcification is present, without any evidence of aortic stenosis.  Comparison(s): Prior images reviewed side by side. Changes from prior study are noted. 10/13/2021: LVEF 60-65%, mild MR with MAC.  Conclusion(s)/Recommendation(s): Would consider further evaluation of the severity of mitral regurgitation with TEE.  FINDINGS Left Ventricle: Left ventricular ejection fraction, by estimation, is 60 to 65%. Left ventricular ejection fraction by PLAX is 63 %. The left ventricle has normal function. The left ventricle has no regional wall motion abnormalities. The left ventricular internal cavity size was normal in size. There is no left ventricular hypertrophy. Left ventricular diastolic parameters are consistent with Grade II diastolic dysfunction (pseudonormalization). Elevated left ventricular end-diastolic pressure.  Right Ventricle: The right ventricular size is normal. No increase in right ventricular wall thickness. Right ventricular systolic function is mildly reduced.  Left Atrium: Left atrial size was normal in size.  Right Atrium: Right atrial size was normal in size.  Pericardium: There is no evidence of pericardial effusion.  The mitral valve is degenerative in appearance. There is moderate calcification of the posterior and anterior mitral valve leaflet(s). Mild to moderate mitral annular calcification. Moderate to severe mitral valve regurgitation, with wall-impinging jet. Mild mitral valve  stenosis. Tricuspid Valve: The tricuspid valve is grossly normal. Tricuspid valve regurgitation is mild.  Aortic Valve: The aortic valve is tricuspid. Aortic valve regurgitation is not visualized. Aortic valve sclerosis/calcification is present, without any evidence of aortic stenosis.  Pulmonic Valve: The pulmonic valve was grossly normal. Pulmonic valve regurgitation is trivial.  Aorta: The aortic root and ascending aorta are structurally normal, with no evidence of dilitation.  IAS/Shunts: No atrial level shunt detected by color flow Doppler.   LEFT VENTRICLE PLAX 2D LV EF:         Left            Diastology ventricular     LV e' medial:    5.04 cm/s ejection        LV E/e' medial:  28.8 fraction by     LV e' lateral:   7.46 cm/s PLAX is 63      LV E/e' lateral: 19.5 %. LVIDd:         4.40 cm LVIDs:         2.90 cm LV PW:         1.00 cm LV IVS:        1.00 cm LVOT diam:     2.00 cm LV SV:         69 LV SV Index:   43 LVOT Area:     3.14 cm   RIGHT VENTRICLE            IVC RV Basal diam:  3.40 cm    IVC diam: 0.90 cm RV S prime:     8.21 cm/s TAPSE (M-mode): 2.1 cm  LEFT ATRIUM             Index        RIGHT ATRIUM          Index LA diam:  4.20 cm 2.62 cm/m   RA Area:     9.70 cm LA Vol (A2C):   32.1 ml 19.99 ml/m  RA Volume:   18.20 ml 11.33 ml/m LA Vol (A4C):   47.0 ml 29.27 ml/m LA Biplane Vol: 40.4 ml 25.16 ml/m AORTIC VALVE LVOT Vmax:   102.30 cm/s LVOT Vmean:  69.933 cm/s LVOT VTI:    0.220 m  AORTA Ao Root diam: 3.10 cm Ao Asc diam:  3.60 cm  MITRAL VALVE                TRICUSPID VALVE MV Area (PHT): 3.50 cm     TR Peak grad:   37.9 mmHg MV Area VTI:   1.70 cm     TR Vmax:        308.00 cm/s MV Peak grad:  8.3 mmHg MV Mean grad:  5.0 mmHg     SHUNTS MV Vmax:       1.44 m/s     Systemic VTI:  0.22 m MV Vmean:      109.0 cm/s   Systemic Diam: 2.00 cm MV Decel Time: 217 msec MR Peak grad: 90.2 mmHg MR Mean grad: 67.5 mmHg MR Vmax:       475.00 cm/s MR Vmean:     397.5 cm/s MV E velocity: 145.50 cm/s MV A velocity: 127.50 cm/s MV E/A ratio:  1.14  Dinah Franco MD Electronically signed by Dinah Franco MD Signature Date/Time: 04/08/2023/1:51:25 PM    Final          ______________________________________________________________________________________________     Recent Labs: 04/20/2023: BUN 22; Creatinine, Ser 3.18; Hemoglobin 11.2; Platelets 238; Potassium 5.2; Sodium 140  Recent Lipid Panel    Component Value Date/Time   CHOL 115 02/19/2022 0713   CHOL 170 11/09/2021 0944   TRIG 132 02/19/2022 0713   HDL 30 (L) 02/19/2022 0713   HDL 36 (L) 11/09/2021 0944   CHOLHDL 3.8 02/19/2022 0713   VLDL 26 02/19/2022 0713   LDLCALC 59 02/19/2022 0713   LDLCALC 102 (H) 11/09/2021 0944    History of Present Illness    78 year old female with the above past medical history including CAD, mitral valve regurgitation, hypertension, hyperlipidemia, ESRD on HD, TIA, renal cell carcinoma s/p partial left nephrectomy, lung cancer s/p radiation therapy, and type 2 diabetes.   She has a history of CAD s/p CABG x3 (LIMA-LAD, SVG, RPL, SVG-Circumflex) in 2012.  She had an abnormal cardiac PET stress test in 2016.  Cardiac catheterization in June 2016 revealed occluded SVG with severe ostial PDA disease, moderate mid RCA disease with stable flow to the post anterolateral system, patent LIMA to diffusely diseased LAD and patent vein graft to OM1 with moderate disease in the AV groove circumflex lesion OM 2.  She was hospitalized in 10/2021 in the setting of NSTEMI. 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 Repeat cardiac catheterization at the time revealed new total occlusion of saphenous vein graft to the obtuse marginal, chronic total occlusion of saphenous vein graft to PDA, patent LIMA to the apical LAD which was totaled  proximally and distal to the insertion site, 70% left main, 70% followed by 80% proximal and mid LAD, 99% ostial circumflex, 70 to 80% right coronary artery, 95% PDA and normal LV function.  Medical therapy was recommended.  It was noted that if symptoms persisted, PCI of the RCA could be considered.  Unfortunately, she was hospitalized again in 10/2021 in the setting of septic shock due to pneumonia. MBS per speech therapy which showed no evidence of aspiration. CT showed stable bilateral pulmonary nodules, incidental finding of 1.7 cystic lesion in the body of pancreas.  Follow-up MRI was recommended in 6 months. She was hospitalized in February 2024 in the setting of acute hypoxic respiratory failure due to COVID-19, chest pain.  Troponin was elevated.  No further intervention was recommended per cardiology. Repeat carotid Dopplers in 03/2022 showed 1 to 39% B ICA stenosis. She was last seen in the office on 03/14/2023 and was stable from a cardiac standpoint.  Repeat echocardiogram in 03/2023 showed EF 60 to 65%, normal LV function, no RWMA, G2 DD, elevated LVEDP, mildly reduced RV systolic function, severe eccentric mitral valve regurgitation with splay artifact directed towards the end dry atrial septum, swirling of the regurgitant jet noted in the left atrium, mild mitral stenosis.  Follow-up TEE was recommended with possible referral to structural heart team.  She presents today for follow-up accompanied by her husband.  Since her last visit she has been stable from a cardiac standpoint.  She notes some mild dyspnea on exertion, mild nonpitting bilateral lower extremity edema, mild orthostatic dizziness, she denies any presyncope or syncope.  She denies any chest pain, palpitations, PND, orthopnea, weight gain. Overall, her symptoms have been stable.    Home Medications    Current Outpatient Medications  Medication Sig Dispense Refill   acetaminophen (TYLENOL) 500 MG tablet Take 1,000 mg by mouth every 6  (six) hours as needed for moderate pain.     Alpha-Lipoic Acid 600 MG CAPS Take 600 mg by mouth in the morning.     benzonatate (TESSALON) 100 MG capsule Take 100-200 mg by mouth 3 (three) times daily as needed for cough.     cinacalcet (SENSIPAR) 30 MG tablet Take 30 mg by mouth at bedtime.     clopidogrel (PLAVIX) 75 MG tablet TAKE 1 TABLET BY MOUTH EVERY DAY 90 tablet 3   cyanocobalamin (VITAMIN B12) 1000 MCG/ML injection Inject 1,000 mcg into the muscle every 30 (thirty) days. (Patient not taking: Reported on 04/20/2023)     Darbepoetin Alfa (ARANESP) 60 MCG/0.3ML SOSY injection Inject 0.3 mLs (60 mcg total) into the vein every Tuesday with hemodialysis. (Patient taking differently: Inject 60 mcg into the vein See admin instructions. Given at dialysis) 4.2 mL    estradiol (ESTRACE) 0.1 MG/GM vaginal cream Place 1 Applicatorful vaginally 3 (three) times a week.     ezetimibe (ZETIA) 10 MG tablet TAKE 1 TABLET BY MOUTH EVERY DAY 90 tablet 3   furosemide (LASIX) 20 MG tablet TAKE 1 TABLET ON MONDAYS,  TUESDAYS AND SATURDAYS. (Patient taking differently: at bedtime. TAKE 1 TABLET ON MONDAYS, Thursday  AND SATURDAYS.) 40 tablet 6   gabapentin (NEURONTIN) 100 MG capsule Take 100 mg by mouth 2 (two) times daily.     isosorbide mononitrate (IMDUR) 30 MG 24 hr tablet Take 2 tablets of 30 mg  ( total 60 mg ) in the morning , 30 mg  in the evening 270 tablet 3   lidocaine-prilocaine (EMLA) cream Apply 1 application topically Every Tuesday,Thursday,and Saturday with dialysis.     loperamide (IMODIUM A-D) 2 MG tablet Take 4 mg by mouth as needed for diarrhea or loose stools.     loratadine (CLARITIN) 10 MG tablet Take 10 mg by mouth in the morning.     magnesium oxide (MAG-OX) 400 MG tablet  Take 400 mg by mouth in the morning.     metoprolol tartrate (LOPRESSOR) 50 MG tablet Take 1 tablet (50 mg total) by mouth 2 (two) times daily. Take  your dose of medication  after Dialysis on  Dialysis day (Patient taking  differently: Take 100 mg by mouth at bedtime. Take  your dose of medication  after Dialysis on  Dialysis day) 60 tablet 7   nitroGLYCERIN (NITROSTAT) 0.4 MG SL tablet PLACE 1 TABLET UNDER THE TONGUE EVERY 5 MINUTES AS NEEDED FOR CHEST PAIN. 25 tablet 7   Omega-3 Fatty Acids (FISH OIL) 1200 MG CAPS Take 2,400 mg by mouth in the morning and at bedtime.     omeprazole (PRILOSEC) 40 MG capsule Take 40 mg by mouth daily before breakfast.     Probiotic Product (ALIGN) 4 MG CAPS Take 4 mg by mouth in the morning.     promethazine-dextromethorphan (PROMETHAZINE-DM) 6.25-15 MG/5ML syrup Take 5 mLs by mouth 4 (four) times daily as needed for cough.     pyridOXINE (VITAMIN B-6) 100 MG tablet Take 200 mg by mouth daily.      sevelamer carbonate (RENVELA) 800 MG tablet Take 800 mg by mouth See admin instructions. Take 800 mg with each meal and each snack     albuterol (VENTOLIN HFA) 108 (90 Base) MCG/ACT inhaler Inhale 1-2 puffs into the lungs every 4 (four) hours as needed for wheezing or shortness of breath.     atorvastatin (LIPITOR) 80 MG tablet Take 1 tablet (80 mg total) by mouth at bedtime. 90 tablet 0   cephALEXin (KEFLEX) 250 MG capsule Take 250 mg by mouth at bedtime.     famotidine (PEPCID) 20 MG tablet Take 1 tablet (20 mg total) by mouth at bedtime. 30 tablet 2   fluticasone (FLONASE) 50 MCG/ACT nasal spray Place 2 sprays into both nostrils daily as needed for allergies or rhinitis.     promethazine (PHENERGAN) 25 MG tablet Take 25 mg by mouth every 6 (six) hours as needed for nausea or vomiting.     sodium chloride (OCEAN) 0.65 % SOLN nasal spray Place 1 spray into both nostrils daily as needed for congestion.     No current facility-administered medications for this visit.     Review of Systems    She denies chest pain, palpitations, pnd, orthopnea, n, v, dizziness, syncope, weight gain, or early satiety. All other systems reviewed and are otherwise negative except as noted above.   Physical  Exam    VS:  BP 124/62 (BP Location: Left Arm, Patient Position: Sitting, Cuff Size: Normal)   Pulse 85   Ht 5' (1.524 m)   Wt 134 lb 12.8 oz (61.1 kg)   SpO2 92%   BMI 26.33 kg/m  GEN: Well nourished, well developed, in no acute distress. HEENT: normal. Neck: Supple, no JVD, carotid bruits, or masses. Cardiac: RRR, no murmurs, rubs, or gallops. No clubbing, cyanosis, edema.  Radials/DP/PT 2+ and equal bilaterally.  Respiratory:  Respirations regular and unlabored, clear to auscultation bilaterally. GI: Soft, nontender, nondistended, BS + x 4. MS: no deformity or atrophy. Skin: warm and dry, no rash. Neuro:  Strength and sensation are intact. Psych: Normal affect.  Accessory Clinical Findings    ECG personally reviewed by me today - EKG Interpretation Date/Time:  Wednesday April 20 2023 11:01:38 EDT Ventricular Rate:  86 PR Interval:  148 QRS Duration:  78 QT Interval:  388 QTC Calculation: 464 R Axis:   91  Text Interpretation:  Normal sinus rhythm Rightward axis Pulmonary disease pattern Septal infarct , age undetermined When compared with ECG of 14-Mar-2023 10:20, Septal infarct is now Present Confirmed by Marlana Silvan ([id number]) on 04/20/2023 12:01:32 PM  - no acute changes.   Lab Results  Component Value Date   WBC 6.1 04/20/2023   HGB 11.2 04/20/2023   HCT 34.6 04/20/2023   MCV 101 (H) 04/20/2023   PLT 238 04/20/2023   Lab Results  Component Value Date   CREATININE 3.18 (H) 04/20/2023   BUN 22 04/20/2023   NA 140 04/20/2023   K 5.2 04/20/2023   CL 97 04/20/2023   CO2 31 (H) 04/20/2023   Lab Results  Component Value Date   ALT 20 02/19/2022   AST 24 02/19/2022   ALKPHOS 136 (H) 02/19/2022   BILITOT 0.8 02/19/2022   Lab Results  Component Value Date   CHOL 115 02/19/2022   HDL 30 (L) 02/19/2022   LDLCALC 59 02/19/2022   TRIG 132 02/19/2022   CHOLHDL 3.8 02/19/2022    Lab Results  Component Value Date   HGBA1C 5.5 02/19/2022    Assessment & Plan     1. Mitral valve regurgitation: Most recent echo in 03/2023 showed EF 60 to 65%, normal LV function, no RWMA, G2 DD, elevated LVEDP, mildly reduced RV systolic function, severe eccentric mitral valve regurgitation with splay artifact directed towards the end dry atrial septum, swirling of the regurgitant jet noted in the reports some mild dyspnea on exertion, mild nonpitting bilateral lower extremity edema, mild orthostatic dizziness, she denies any presyncope or syncope. Euvolemic and well compensated on exam. Discussed with Dr. Addie Holstein and with patient.  Through shared decision making, will pursue TEE to further assess mitral valve regurgitation.  Will check BMET, CBC today.  If mitral valve regurgitation remains severe, recommend referral to structural heart team, she is agreeable to this.  Continue metoprolol, Lasix.  Informed Consent   Shared Decision Making/Informed Consent   The risks [esophageal damage, perforation (1:10,000 risk), bleeding, pharyngeal hematoma as well as other potential complications associated with conscious sedation including aspiration, arrhythmia, respiratory failure and death], benefits (treatment guidance and diagnostic support) and alternatives of a transesophageal echocardiogram were discussed in detail with Ms. Schimpf and she is willing to proceed.      2.  CAD: S/p CABG x3 (LIMA-LAD, SVG, RPL, SVG-Circumflex) in 2012. Most recent cath in the setting of NSTEMI on 10/13/2021 revealed new total occlusion of saphenous vein graft to the obtuse marginal, chronic total occlusion of saphenous vein graft to PDA, patent LIMA to the apical LAD which was totaled proximally and distal to the insertion site, 70% left main, 70% followed by 80% proximal and mid LAD, 99% ostial circumflex, 70 to 80% right coronary artery, 95% PDA and normal LV function. Medical therapy recommended. It was noted that if symptoms persisted, PCI of the RCA could be considered.  She reports stable dyspnea denies  any other symptoms concerning for angina.  Continue aspirin, Plavix, metoprolol, Imdur, Lipitor and Zetia.    3. Bilateral lower extremity edema:  Most recent echo as above. She reports stable nonpitting bilateral lower extremity edema, stable mild dyspnea on exertion. Euvolemic and well compensated on exam. Continue Lasix per nephrology (she is on a modified schedule and takes it Monday, Tuesday and Saturday only).    4. Hypertension: BP well controlled. Continue current antihypertensive regimen.    5.. Hyperlipidemia: LDL was 68 in 01/2023.  Continue Lipitor, Zetia.  6. ESRD: On HD TTS. Follows with nephrology.    7. History of TIA/L carotid bruit: No recurrence. Repeat carotid Dopplers in 03/2022 showed 1 to 39% B ICA stenosis. Continue aspirin, Plavix, Lipitor, Zetia.   8. Type 2 diabetes: A1c was 5.4 in 01/2023. Monitored and managed per PCP.     10. Disposition: Follow-up as scheduled with APP in June 2025, sooner if needed, follow-up in 4 to 6 months with Dr. Addie Holstein.      Jude Norton, NP 04/21/2023, 5:59 PM

## 2023-04-20 NOTE — Patient Instructions (Signed)
 Medication Instructions:  Your physician recommends that you continue on your current medications as directed. Please refer to the Current Medication list given to you today.  *If you need a refill on your cardiac medications before your next appointment, please call your pharmacy*  Lab Work: BMET & CBC  Testing/Procedures: Your physician has requested that you have a TEE. During a TEE, sound waves are used to create images of your heart. It provides your doctor with information about the size and shape of your heart and how well your heart's chambers and valves are working. In this test, a transducer is attached to the end of a flexible tube that's guided down your throat and into your esophagus (the tube leading from you mouth to your stomach) to get a more detailed image of your heart. You are not awake for the procedure. Please see the instruction sheet given to you today. For further information please visit [email or web address]    Follow-Up: At [facility], you and your health needs are our priority.  As part of our continuing mission to provide you with exceptional heart care, our providers are all part of one team.  This team includes your primary Cardiologist (physician) and Advanced Practice Providers or APPs (Physician Assistants and Nurse Practitioners) who all work together to provide you with the care you need, when you need it.  Your next appointment:    4-6 months Dr. Herbie Baltimore  Keep f/u with Bernadene Person NP Provider:   Bryan Lemma, MD  or Bernadene Person, NP        We recommend signing up for the patient portal called "MyChart".  Sign up information is provided on this After Visit Summary.  MyChart is used to connect with patients for Virtual Visits (Telemedicine).  Patients are able to view lab/test results, encounter notes, upcoming appointments, etc.  Non-urgent messages can be sent to your provider as well.   To learn more about what you can do with MyChart, go to  [email or web address].   Other Instructions       PATIENT INSTRUCTIONS         TRANSESOPHAGEAL ECHO (TEE)     [facility]         [place]"     [address]     Patient Name: Dana Jackson                         Diagnosis:    ICD-10-CM   1. Coronary artery disease involving native coronary artery of native heart without angina pectoris  I25.10     2. S/P CABG x 3  Z95.1     3. Bilateral lower extremity edema  R60.0     4. Essential hypertension  I10     5. Hyperlipidemia LDL goal <70  E78.5     6. ESRD on hemodialysis (HCC)  N18.6    Z99.2     7. History of TIAs  Z86.73     8. Bilateral carotid artery stenosis  I65.23     9. Controlled type 2 diabetes mellitus with stage 5 chronic kidney disease not on chronic dialysis, without long-term current use of insulin (HCC)  E11.22    N18.5          DATE AND TIME OF PROCEDURE: 04/25/2023  11:30 AM    Please register at the Admitting Department at     .  DO NOT EAT OR DRINK ANYTHING  after midnight prior to your procedure.    You may take your medications with a sip of water.  You WILL need someone with you to drive you home after the procedure.  If you have any questions, please call our office at [phone].                 1st Floor: - Lobby - Registration  - Pharmacy  - Lab - Cafe  2nd Floor: - PV Lab - Diagnostic Testing (echo, CT, nuclear med)  3rd Floor: - Vacant  4th Floor: - TCTS (cardiothoracic surgery) - AFib Clinic - Structural Heart Clinic - Vascular Surgery  - Vascular Ultrasound  5th Floor: - [facility] (general and EP) - Clinical Pharmacy for coumadin, hypertension, lipid, weight-loss medications, and med management appointments    Valet parking services will be available as well.

## 2023-04-20 NOTE — Progress Notes (Unsigned)
 Office Visit    Patient Name: Dana Jackson Date of Encounter: 04/20/2023  Primary Care Provider:  Roger Kill, PA-C Primary Cardiologist:  Bryan Lemma, MD  Chief Complaint    78 year old female with a history of CAD, mitral valve regurgitation, hypertension, hyperlipidemia, ESRD on HD, TIA, renal cell carcinoma s/p partial left nephrectomy, lung cancer s/p radiation therapy, and type 2 diabetes who presents for follow-up related to CAD and mitral valve regurgitation.   Past Medical History    Past Medical History:  Diagnosis Date   Anemia    Arthritis    hands   Asthma    childhood   Bacteremia due to Escherichia coli 06/2013   Currently being treated with anti-biotics   CAD in native artery 12/2010   a) Cath for exertional angina & EKG changes: 40% LM, 80% mid LAD.  95% ost cX, 80-90% PDA --> CABG X3; b) Post CABG CATH for + Myoview with basal anterior ischemia -> Ost LAD 80% (diffuse) then 100% after SP2, RI 70% (too small for PCI), Ost-prox Cx 99% & OM1 90%, OstrPDA 80% (small); Occluded SVG-rPDA.  Patent LIMA-dLAD, SVG-OM: culprit ~ p-m LAD pre-LIMA & RI - not good PCI target --> Med Rx   CKD (chronic kidney disease) stage 3, GFR 30-59 ml/min (HCC)    Degenerative disc disease, cervical    Degenerative disc disease, cervical [722.4]   Diabetes mellitus without complication (HCC)    Type II   Dyspnea    Endometrial cancer (HCC) 11/2013   Treated with TAH with pelvic lymphadenectomy followed by radiation and chemotherapy   GERD (gastroesophageal reflux disease)    Hearing loss    bilateral - no hearing aids   Heart murmur    History of asthma     childhood   History of blood transfusion    History of pneumonia    "2-3 times"   History of unstable angina November/December 2012   T wave inversions in inferolateral leads.  No stress test performed.   Hyperlipidemia LDL goal <70    Hypertension, essential, benign    Neuropathy    hands   Pneumonia 2018    Renal cell carcinoma (HCC) 11/18/2008   T2aNx s/p partial left nephrectomy   S/P CABG x 3 12/2010   LIMA-LAD, SVG RPL, SVG-Circumflex   Stroke (HCC)    TIA- no residual    TIA (transient ischemic attack) 1992 &  2010   Past Surgical History:  Procedure Laterality Date   ANTERIOR CERVICAL DECOMP/DISCECTOMY FUSION  10/11/2005   multi-level   AV FISTULA PLACEMENT Left 08/03/2017   Procedure: ARTERIOVENOUS (AV) FISTULA CREATION LEFT ARM;  Surgeon: Larina Earthly, MD;  Location: [facility] OR;  Service: Vascular;  Laterality: Left;   BRONCHIAL BIOPSY  12/30/2020   Procedure: BRONCHIAL BIOPSIES;  Surgeon: Josephine Igo, DO;  Location: [facility];  Service: Pulmonary;;   BRONCHIAL BIOPSY  02/03/2021   Procedure: BRONCHIAL BIOPSIES;  Surgeon: Josephine Igo, DO;  Location: [facility];  Service: Pulmonary;;   BRONCHIAL BRUSHINGS  12/30/2020   Procedure: BRONCHIAL BRUSHINGS;  Surgeon: Josephine Igo, DO;  Location: [facility];  Service: Pulmonary;;   BRONCHIAL BRUSHINGS  02/03/2021   Procedure: BRONCHIAL BRUSHINGS;  Surgeon: Josephine Igo, DO;  Location: [facility];  Service: Pulmonary;;   BRONCHIAL NEEDLE ASPIRATION BIOPSY  12/30/2020   Procedure: BRONCHIAL NEEDLE ASPIRATION BIOPSIES;  Surgeon: Josephine Igo, DO;  Location: [facility];  Service: Pulmonary;;   BRONCHIAL NEEDLE ASPIRATION  BIOPSY  02/03/2021   Procedure: BRONCHIAL NEEDLE ASPIRATION BIOPSIES;  Surgeon: Josephine Igo, DO;  Location: [facility];  Service: Pulmonary;;   CARDIAC CATHETERIZATION  12/24/10   40% left main, 80% mid LAD, 95% ostial circumflex, 80-90% PDA.   CARDIAC CATHETERIZATION N/A 07/01/2014   Procedure: Left Heart Cath and Cors/Grafts Angiography;  Surgeon: Marykay Lex, MD;  Location: [facility]:  For Abn Nuc @ [facility]: Ost LAD 80%, mid LAD 100% after S/P 2, 70% RI (no PCI target), ost-prox Cx 99%, OM1 90%. Ost rPDA 80% (~ pre-CABG), occluded SVG-rPDA, patent LIMA-dLAD, SVG OM; potential culprit for  abn Nuc scan = pLAD Dz, small RI 70% or PDA -> med Rx   CAROTID ENDARTERECTOMY Left    CHOLECYSTECTOMY  ~ 1971   COLONOSCOPY     CORONARY ARTERY BYPASS GRAFT  12/25/2010   Procedure: CORONARY ARTERY BYPASS GRAFTING (CABGX3 - LIMA-LAD, SVG RPL, SVG-Circumflex);  Surgeon: Purcell Nails, MD;  Location: [facility] OR;  Service: Open Heart Surgery;  Laterality: N/A;   DILATATION & CURETTAGE/HYSTEROSCOPY WITH MYOSURE N/A 11/02/2013   Procedure: DILATATION & CURETTAGE/HYSTEROSCOPY WITH MYOSURE ABLATION;  Surgeon: Leslie Andrea, MD;  Location: [facility] ORS;  Service: Gynecology;  Laterality: N/A;   DOPPLER ECHOCARDIOGRAPHY  12/08/2010   EF =>55%,MILD CONCENTRIC LEFT VENTRICULAR HYPERTROPHY   EYE SURGERY Bilateral    Cataract   IR DIALY SHUNT INTRO NEEDLE/INTRACATH INITIAL W/IMG LEFT Left 04/24/2019   IR FLUORO GUIDE CV LINE LEFT  04/26/2019   IR US GUIDE VASC ACCESS LEFT  04/24/2019   IR US GUIDE VASC ACCESS LEFT  04/26/2019   LEFT HEART CATH AND CORS/GRAFTS ANGIOGRAPHY N/A 10/13/2021   Procedure: LEFT HEART CATH AND CORS/GRAFTS ANGIOGRAPHY;  Surgeon: Lyn Records, MD;  [facility];  Service: Cardiovascular;  Laterality: N/A;   LEFT HEART CATHETERIZATION WITH CORONARY ANGIOGRAM N/A 12/24/2010   Procedure: LEFT HEART CATHETERIZATION WITH CORONARY ANGIOGRAM;  Surgeon: Marykay Lex, MD;  Location: [facility] CATH LAB;  Service: Cardiovascular;  Laterality: N/A;   NM MYOCAR SINGLE W/SPECT  07/26/2007   EF 79%, LEFT VENT.FUNCTION NORMAL   PARTIAL NEPHRECTOMY Left 11/18/2008   left partial nephrectomy for renal cell CA   PET Myocardial Perfusion Scan  06/13/2014   At [facility]: Moderate size, mild severity completely reversible defect involving the basal anterior, mid anterior and apical anterior segments consistent with ischemia. EF 65% with normal global function.   TONSILLECTOMY     "in college"   TOTAL ABDOMINAL HYSTERECTOMY  November 2015    At [facility]: Robotic procedure with pelvic  lymphadenectomy   TRANSTHORACIC ECHOCARDIOGRAM  06/13/2014   At [facility]: EF 60-65%. Grade 1 diastolic dysfunction. Mild MR. Aortic sclerosis. Moderate pulmonary hypertension   TUBAL LIGATION  ~ 1984   UPPER GI ENDOSCOPY     VIDEO BRONCHOSCOPY WITH RADIAL ENDOBRONCHIAL ULTRASOUND  12/30/2020   Procedure: RADIAL ENDOBRONCHIAL ULTRASOUND;  Surgeon: Josephine Igo, DO;  Location: [facility];  Service: Pulmonary;;   VIDEO BRONCHOSCOPY WITH RADIAL ENDOBRONCHIAL ULTRASOUND  02/03/2021   Procedure: RADIAL ENDOBRONCHIAL ULTRASOUND;  Surgeon: Josephine Igo, DO;  Location: [facility];  Service: Pulmonary;;   WISDOM TOOTH EXTRACTION      Allergies  Allergies  Allergen Reactions   Sulfa Antibiotics Other (See Comments)    "AKI"    Erythromycin Itching   Pravastatin Other (See Comments)    Unknown reaction   Simvastatin Other (See Comments)    Reaction  not known   Codeine Nausea Only and Other (See Comments)    Tolerate Hycodan   Gemfibrozil Itching     Labs/Other Studies Reviewed    The following studies were reviewed today:  Cardiac Studies & Procedures   ______________________________________________________________________________________________ CARDIAC CATHETERIZATION  CARDIAC CATHETERIZATION 10/13/2021  Narrative CONCLUSIONS: New culprit total occlusion of SVG to the obtuse marginal Chronic total occlusion of SVG to PDA Patent LIMA to apical LAD which is totally occluded proximal to the graft insertion site and in the apical segment beyond the graft and insertion site. 70% distal left main. 70% ostial LAD and sequential 80% stenoses in the proximal and mid LAD Ostial 95 to 99% heavily calcified circumflex with severe diffuse disease in the dominant obtuse marginal. Moderate mid RCA disease and 70 to 80% distal disease.  95% stenosis in the PDA and 70% proximal LV branch disease. Preserved LV function with EF approximately 50 to 60% with EDP 12  mmHg.   RECOMMENDATIONS:  There are no good PCI or surgical options.  We will have other team members to look at pictures and give their opinion.  If refractory angina unresponsive to medical therapy we could treat the right coronary however it is the least severely diseased vessel of the 3 major epicardial coronaries.  I do not believe repeat surgery is an option at her age and with end-stage kidney disease.  Findings Coronary Findings Diagnostic  Dominance: Right  Left Main There is severe diffuse disease throughout the vessel. Ost LM to Ost LAD lesion is 70% stenosed.  Left Anterior Descending There is severe diffuse disease throughout the vessel. Prox LAD to Mid LAD lesion is 70% stenosed. Mid LAD lesion is 80% stenosed. Dist LAD-1 lesion is 100% stenosed. Dist LAD-2 lesion is 100% stenosed.  Left Circumflex There is moderate diffuse disease throughout the vessel. Ost Cx to Prox Cx lesion is 99% stenosed.  First Obtuse Marginal Branch 1st Mrg lesion is 100% stenosed.  Second Obtuse Marginal Branch There is mild disease in the vessel. 2nd Mrg-1 lesion is 95% stenosed. 2nd Mrg-2 lesion is 90% stenosed.  Right Coronary Artery There is moderate diffuse disease throughout the vessel. Mid RCA lesion is 60% stenosed. Dist RCA lesion is 70% stenosed.  Right Posterior Descending Artery RPDA lesion is 99% stenosed.  First Right Posterolateral Branch 1st RPL lesion is 90% stenosed.  Graft To RPDA Origin to Prox Graft lesion is 100% stenosed.  Graft To 2nd Mrg Origin lesion is 100% stenosed.  LIMA Graft To Dist LAD  Intervention  No interventions have been documented.   CARDIAC CATHETERIZATION  CARDIAC CATHETERIZATION 07/01/2014  Narrative Images from the original result were not included. 1. Severe multivessel native CAD with 2 of 3 grafts patent and occluded SVG-rPDA. Patent LIMA-~dLAD, SVG-OM. Occluded SVG-rPDA at the origin 2. Ost LAD lesion, 80% stenosed.  Mid LAD lesion, 100% stenosed after 2nd septal perforator. The proximal-mid LAD is at least moderately diseased 3. Ramus lesion, 70% stenosed - small caliber, non-PCI amenable 4. Ost Cx to Prox Cx lesion, 99% stenosed. 1st Mrg lesion, 90% stenosed. 5. Ost RPDA to RPDA lesion, 80% stenosed. Similar to pre-CABG. < 2 mm vessel. 6. Potential culprit for abnormal Nuclear Stress Test is the diffusely diseased proximal-mid LAD with diseased septal perforators from LAD & rPDA  No PCI target available to treat the "anterior ischemia" in an asymptomatic patient.  Recommendations:  Post Femoral cath with sheath removal  Aggressive IV fluid hydration overnight  Overnight - outpatient in  bed for post cath hydration given CKD.  Continue current med Rx.  Will need to check CMP at the end of the week.   Marykay Lex, M.D., M.S. Interventional Cardiologist  Pager # [phone]  Findings Coronary Findings Diagnostic  Dominance: Right  Left Main The vessel is large .  Left Anterior Descending There is severe the vessel.  calcified chronic total occlusion .  First Septal Branch The vessel is small in size.  Second Septal Branch The vessel is small in size.  Ramus Intermedius The vessel is small . tubular eccentric .   Tapered to small branch.  Lateral Ramus Intermedius The vessel is small in size.  Left Circumflex The vessel is large . diffuse located at the major branch . discrete .  At this point, the vessel is small caliber  First Obtuse Marginal Branch tubular .  Competitive flow from SVG  Lateral First Obtuse Marginal Branch The vessel is small in size.  Third Obtuse Marginal Branch The vessel is small in size.  Right Coronary Artery The vessel is large . tubular .  Right Posterior Descending Artery The vessel is small in size. diffuse .  Previously grafted, small caliber vessel.  &lt; 2 mm  Right Posterior Atrioventricular Artery The vessel is moderate  in size.  First Right Posterolateral Branch The vessel is moderate in size.  Second Right Posterolateral Branch The vessel is small in size.  Third Right Posterolateral Branch The vessel is moderate in size.  LIMA LIMA Graft To Dist LAD LIMA is and is anatomically normal. Tortuous graft to mid-distal LAD.  Single Graft Graft To 1st Mrg SVG is normal in caliber. There is competitive flow.  Single Graft Graft To RPDA SVG chronic total occlusion .  Intervention  No interventions have been documented.   STRESS TESTS  MYOCARDIAL PERFUSION IMAGING 07/26/2007   ECHOCARDIOGRAM  ECHOCARDIOGRAM COMPLETE 04/08/2023  Narrative ECHOCARDIOGRAM REPORT    Patient Name:   CRISSY MCCREADIE Date of Exam: 04/08/2023 Medical Rec #:  [id number]      Height:       60.0 in Accession #:    [id number]     Weight:       140.4 lb Date of Birth:  [birth year]     BSA:          1.606 m Patient Age:    56 years       BP:           131/70 mmHg Patient Gender: F              HR:           81 bpm. Exam Location:  [address]  Procedure: 2D Echo, Cardiac Doppler and Color Doppler (Both Spectral and Color Flow Doppler were utilized during procedure).  Indications:    I25.10 CAD  History:        Patient has prior history of Echocardiogram examinations, most recent 10/13/2021. CAD, Prior CABG, Stroke, Signs/Symptoms:Murmur, Shortness of Breath and Dyspnea; Risk Factors:Diabetes.  Sonographer:    Daphine Deutscher RDCS Referring Phys: [id number] DAVID W HARDING  IMPRESSIONS   1. Left ventricular ejection fraction, by estimation, is 60 to 65%. Left ventricular ejection fraction by PLAX is 63 %. The left ventricle has normal function. The left ventricle has no regional wall motion abnormalities. Left ventricular diastolic parameters are consistent with Grade II diastolic dysfunction (pseudonormalization). Elevated left ventricular end-diastolic pressure. 2. Right ventricular systolic function is  mildly reduced. The right ventricular size  is normal. 3. The mitral valve is degenerative. Moderate to more likely severe, eccentric mitral valve regurgitation with splay artifact directed toward the interatrial septum - swirling of the regurgitant jet is noted in the left atrium. Mild mitral stenosis. The mean mitral valve gradient is 5.0 mmHg with average heart rate of 81 bpm. 4. The aortic valve is tricuspid. Aortic valve regurgitation is not visualized. Aortic valve sclerosis/calcification is present, without any evidence of aortic stenosis.  Comparison(s): Prior images reviewed side by side. Changes from prior study are noted. 10/13/2021: LVEF 60-65%, mild MR with MAC.  Conclusion(s)/Recommendation(s): Would consider further evaluation of the severity of mitral regurgitation with TEE.  FINDINGS Left Ventricle: Left ventricular ejection fraction, by estimation, is 60 to 65%. Left ventricular ejection fraction by PLAX is 63 %. The left ventricle has normal function. The left ventricle has no regional wall motion abnormalities. The left ventricular internal cavity size was normal in size. There is no left ventricular hypertrophy. Left ventricular diastolic parameters are consistent with Grade II diastolic dysfunction (pseudonormalization). Elevated left ventricular end-diastolic pressure.  Right Ventricle: The right ventricular size is normal. No increase in right ventricular wall thickness. Right ventricular systolic function is mildly reduced.  Left Atrium: Left atrial size was normal in size.  Right Atrium: Right atrial size was normal in size.  Pericardium: There is no evidence of pericardial effusion.  The mitral valve is degenerative in appearance. There is moderate calcification of the posterior and anterior mitral valve leaflet(s). Mild to moderate mitral annular calcification. Moderate to severe mitral valve regurgitation, with wall-impinging jet. Mild mitral valve  stenosis. Tricuspid Valve: The tricuspid valve is grossly normal. Tricuspid valve regurgitation is mild.  Aortic Valve: The aortic valve is tricuspid. Aortic valve regurgitation is not visualized. Aortic valve sclerosis/calcification is present, without any evidence of aortic stenosis.  Pulmonic Valve: The pulmonic valve was grossly normal. Pulmonic valve regurgitation is trivial.  Aorta: The aortic root and ascending aorta are structurally normal, with no evidence of dilitation.  IAS/Shunts: No atrial level shunt detected by color flow Doppler.   LEFT VENTRICLE PLAX 2D LV EF:         Left            Diastology ventricular     LV e' medial:    5.04 cm/s ejection        LV E/e' medial:  28.8 fraction by     LV e' lateral:   7.46 cm/s PLAX is 63      LV E/e' lateral: 19.5 %. LVIDd:         4.40 cm LVIDs:         2.90 cm LV PW:         1.00 cm LV IVS:        1.00 cm LVOT diam:     2.00 cm LV SV:         69 LV SV Index:   43 LVOT Area:     3.14 cm   RIGHT VENTRICLE            IVC RV Basal diam:  3.40 cm    IVC diam: 0.90 cm RV S prime:     8.21 cm/s TAPSE (M-mode): 2.1 cm  LEFT ATRIUM             Index        RIGHT ATRIUM          Index LA diam:  4.20 cm 2.62 cm/m   RA Area:     9.70 cm LA Vol (A2C):   32.1 ml 19.99 ml/m  RA Volume:   18.20 ml 11.33 ml/m LA Vol (A4C):   47.0 ml 29.27 ml/m LA Biplane Vol: 40.4 ml 25.16 ml/m AORTIC VALVE LVOT Vmax:   102.30 cm/s LVOT Vmean:  69.933 cm/s LVOT VTI:    0.220 m  AORTA Ao Root diam: 3.10 cm Ao Asc diam:  3.60 cm  MITRAL VALVE                TRICUSPID VALVE MV Area (PHT): 3.50 cm     TR Peak grad:   37.9 mmHg MV Area VTI:   1.70 cm     TR Vmax:        308.00 cm/s MV Peak grad:  8.3 mmHg MV Mean grad:  5.0 mmHg     SHUNTS MV Vmax:       1.44 m/s     Systemic VTI:  0.22 m MV Vmean:      109.0 cm/s   Systemic Diam: 2.00 cm MV Decel Time: 217 msec MR Peak grad: 90.2 mmHg MR Mean grad: 67.5 mmHg MR Vmax:       475.00 cm/s MR Vmean:     397.5 cm/s MV E velocity: 145.50 cm/s MV A velocity: 127.50 cm/s MV E/A ratio:  1.14  Zoila Shutter MD Electronically signed by Zoila Shutter MD Signature Date/Time: 04/08/2023/1:51:25 PM    Final          ______________________________________________________________________________________________     Recent Labs: 04/20/2023: BUN 22; Creatinine, Ser 3.18; Hemoglobin 11.2; Platelets 238; Potassium 5.2; Sodium 140  Recent Lipid Panel    Component Value Date/Time   CHOL 115 02/19/2022 0713   CHOL 170 11/09/2021 0944   TRIG 132 02/19/2022 0713   HDL 30 (L) 02/19/2022 0713   HDL 36 (L) 11/09/2021 0944   CHOLHDL 3.8 02/19/2022 0713   VLDL 26 02/19/2022 0713   LDLCALC 59 02/19/2022 0713   LDLCALC 102 (H) 11/09/2021 0944    History of Present Illness    78 year old female with the above past medical history including CAD, mitral valve regurgitation, hypertension, hyperlipidemia, ESRD on HD, TIA, renal cell carcinoma s/p partial left nephrectomy, lung cancer s/p radiation therapy, and type 2 diabetes.   She has a history of CAD s/p CABG x3 (LIMA-LAD, SVG, RPL, SVG-Circumflex) in 2012.  She had an abnormal cardiac PET stress test in 2016.  Cardiac catheterization in June 2016 revealed occluded SVG with severe ostial PDA disease, moderate mid RCA disease with stable flow to the post anterolateral system, patent LIMA to diffusely diseased LAD and patent vein graft to OM1 with moderate disease in the AV groove circumflex lesion OM 2.  She was hospitalized in 10/2021 in the setting of NSTEMI. 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 Repeat cardiac catheterization at the time revealed new total occlusion of saphenous vein graft to the obtuse marginal, chronic total occlusion of saphenous vein graft to PDA, patent LIMA to the apical LAD which was totaled  proximally and distal to the insertion site, 70% left main, 70% followed by 80% proximal and mid LAD, 99% ostial circumflex, 70 to 80% right coronary artery, 95% PDA and normal LV function.  Medical therapy was recommended.  It was noted that if symptoms persisted, PCI of the RCA could be considered.  Unfortunately, she was hospitalized again in 10/2021 in the setting of septic shock due to pneumonia. MBS per speech therapy which showed no evidence of aspiration. CT showed stable bilateral pulmonary nodules, incidental finding of 1.7 cystic lesion in the body of pancreas.  Follow-up MRI was recommended in 6 months. She was hospitalized in February 2024 in the setting of acute hypoxic respiratory failure due to COVID-19, chest pain.  Troponin was elevated.  No further intervention was recommended per cardiology. Repeat carotid Dopplers in 03/2022 showed 1 to 39% B ICA stenosis. She was last seen in the office on 03/14/2023 and was stable from a cardiac standpoint.  Repeat echocardiogram in 03/2023 showed EF 60 to 65%, normal LV function, no RWMA, G2 DD, elevated LVEDP, mildly reduced RV systolic function, severe eccentric mitral valve regurgitation with splay artifact directed towards the end dry atrial septum, swirling of the regurgitant jet noted in the left atrium, mild mitral stenosis.  Follow-up TEE was recommended with possible referral to structural heart team.  She presents today for follow-up accompanied by her husband.  Since her last visit she has been stable from a cardiac standpoint.  She notes some mild dyspnea on exertion, mild nonpitting bilateral lower extremity edema, mild orthostatic dizziness, she denies any presyncope or syncope.  She denies any chest pain, palpitations, PND, orthopnea, weight gain. Overall, her symptoms have been stable.    Home Medications    Current Outpatient Medications  Medication Sig Dispense Refill   acetaminophen (TYLENOL) 500 MG tablet Take 1,000 mg by mouth every 6  (six) hours as needed for moderate pain.     Alpha-Lipoic Acid 600 MG CAPS Take 600 mg by mouth in the morning.     benzonatate (TESSALON) 100 MG capsule Take 100-200 mg by mouth 3 (three) times daily as needed for cough.     cinacalcet (SENSIPAR) 30 MG tablet Take 30 mg by mouth at bedtime.     clopidogrel (PLAVIX) 75 MG tablet TAKE 1 TABLET BY MOUTH EVERY DAY 90 tablet 3   cyanocobalamin (VITAMIN B12) 1000 MCG/ML injection Inject 1,000 mcg into the muscle every 30 (thirty) days. (Patient not taking: Reported on 04/20/2023)     Darbepoetin Alfa (ARANESP) 60 MCG/0.3ML SOSY injection Inject 0.3 mLs (60 mcg total) into the vein every Tuesday with hemodialysis. (Patient taking differently: Inject 60 mcg into the vein See admin instructions. Given at dialysis) 4.2 mL    estradiol (ESTRACE) 0.1 MG/GM vaginal cream Place 1 Applicatorful vaginally 3 (three) times a week.     ezetimibe (ZETIA) 10 MG tablet TAKE 1 TABLET BY MOUTH EVERY DAY 90 tablet 3   furosemide (LASIX) 20 MG tablet TAKE 1 TABLET ON MONDAYS,  TUESDAYS AND SATURDAYS. (Patient taking differently: at bedtime. TAKE 1 TABLET ON MONDAYS, Thursday  AND SATURDAYS.) 40 tablet 6   gabapentin (NEURONTIN) 100 MG capsule Take 100 mg by mouth 2 (two) times daily.     isosorbide mononitrate (IMDUR) 30 MG 24 hr tablet Take 2 tablets of 30 mg  ( total 60 mg ) in the morning , 30 mg  in the evening 270 tablet 3   lidocaine-prilocaine (EMLA) cream Apply 1 application topically Every Tuesday,Thursday,and Saturday with dialysis.     loperamide (IMODIUM A-D) 2 MG tablet Take 4 mg by mouth as needed for diarrhea or loose stools.     loratadine (CLARITIN) 10 MG tablet Take 10 mg by mouth in the morning.     magnesium oxide (MAG-OX) 400 MG tablet  Take 400 mg by mouth in the morning.     metoprolol tartrate (LOPRESSOR) 50 MG tablet Take 1 tablet (50 mg total) by mouth 2 (two) times daily. Take  your dose of medication  after Dialysis on  Dialysis day (Patient taking  differently: Take 100 mg by mouth at bedtime. Take  your dose of medication  after Dialysis on  Dialysis day) 60 tablet 7   nitroGLYCERIN (NITROSTAT) 0.4 MG SL tablet PLACE 1 TABLET UNDER THE TONGUE EVERY 5 MINUTES AS NEEDED FOR CHEST PAIN. 25 tablet 7   Omega-3 Fatty Acids (FISH OIL) 1200 MG CAPS Take 2,400 mg by mouth in the morning and at bedtime.     omeprazole (PRILOSEC) 40 MG capsule Take 40 mg by mouth daily before breakfast.     Probiotic Product (ALIGN) 4 MG CAPS Take 4 mg by mouth in the morning.     promethazine-dextromethorphan (PROMETHAZINE-DM) 6.25-15 MG/5ML syrup Take 5 mLs by mouth 4 (four) times daily as needed for cough.     pyridOXINE (VITAMIN B-6) 100 MG tablet Take 200 mg by mouth daily.      sevelamer carbonate (RENVELA) 800 MG tablet Take 800 mg by mouth See admin instructions. Take 800 mg with each meal and each snack     albuterol (VENTOLIN HFA) 108 (90 Base) MCG/ACT inhaler Inhale 1-2 puffs into the lungs every 4 (four) hours as needed for wheezing or shortness of breath.     atorvastatin (LIPITOR) 80 MG tablet Take 1 tablet (80 mg total) by mouth at bedtime. 90 tablet 0   cephALEXin (KEFLEX) 250 MG capsule Take 250 mg by mouth at bedtime.     famotidine (PEPCID) 20 MG tablet Take 1 tablet (20 mg total) by mouth at bedtime. 30 tablet 2   fluticasone (FLONASE) 50 MCG/ACT nasal spray Place 2 sprays into both nostrils daily as needed for allergies or rhinitis.     promethazine (PHENERGAN) 25 MG tablet Take 25 mg by mouth every 6 (six) hours as needed for nausea or vomiting.     sodium chloride (OCEAN) 0.65 % SOLN nasal spray Place 1 spray into both nostrils daily as needed for congestion.     No current facility-administered medications for this visit.     Review of Systems    She denies chest pain, palpitations, pnd, orthopnea, n, v, dizziness, syncope, weight gain, or early satiety. All other systems reviewed and are otherwise negative except as noted above.   Physical  Exam    VS:  BP 124/62 (BP Location: Left Arm, Patient Position: Sitting, Cuff Size: Normal)   Pulse 85   Ht 5' (1.524 m)   Wt 134 lb 12.8 oz (61.1 kg)   SpO2 92%   BMI 26.33 kg/m  GEN: Well nourished, well developed, in no acute distress. HEENT: normal. Neck: Supple, no JVD, carotid bruits, or masses. Cardiac: RRR, no murmurs, rubs, or gallops. No clubbing, cyanosis, edema.  Radials/DP/PT 2+ and equal bilaterally.  Respiratory:  Respirations regular and unlabored, clear to auscultation bilaterally. GI: Soft, nontender, nondistended, BS + x 4. MS: no deformity or atrophy. Skin: warm and dry, no rash. Neuro:  Strength and sensation are intact. Psych: Normal affect.  Accessory Clinical Findings    ECG personally reviewed by me today - EKG Interpretation Date/Time:  Wednesday April 20 2023 11:01:38 EDT Ventricular Rate:  86 PR Interval:  148 QRS Duration:  78 QT Interval:  388 QTC Calculation: 464 R Axis:   91  Text Interpretation:  Normal sinus rhythm Rightward axis Pulmonary disease pattern Septal infarct , age undetermined When compared with ECG of 14-Mar-2023 10:20, Septal infarct is now Present Confirmed by Bernadene Person ([id number]) on 04/20/2023 12:01:32 PM  - no acute changes.   Lab Results  Component Value Date   WBC 6.1 04/20/2023   HGB 11.2 04/20/2023   HCT 34.6 04/20/2023   MCV 101 (H) 04/20/2023   PLT 238 04/20/2023   Lab Results  Component Value Date   CREATININE 3.18 (H) 04/20/2023   BUN 22 04/20/2023   NA 140 04/20/2023   K 5.2 04/20/2023   CL 97 04/20/2023   CO2 31 (H) 04/20/2023   Lab Results  Component Value Date   ALT 20 02/19/2022   AST 24 02/19/2022   ALKPHOS 136 (H) 02/19/2022   BILITOT 0.8 02/19/2022   Lab Results  Component Value Date   CHOL 115 02/19/2022   HDL 30 (L) 02/19/2022   LDLCALC 59 02/19/2022   TRIG 132 02/19/2022   CHOLHDL 3.8 02/19/2022    Lab Results  Component Value Date   HGBA1C 5.5 02/19/2022    Assessment & Plan     1. Mitral valve regurgitation: Most recent echo in 03/2023 showed EF 60 to 65%, normal LV function, no RWMA, G2 DD, elevated LVEDP, mildly reduced RV systolic function, severe eccentric mitral valve regurgitation with splay artifact directed towards the end dry atrial septum, swirling of the regurgitant jet noted in the reports some mild dyspnea on exertion, mild nonpitting bilateral lower extremity edema, mild orthostatic dizziness, she denies any presyncope or syncope. Euvolemic and well compensated on exam. Discussed with Dr. Herbie Baltimore and with patient.  Through shared decision making, will pursue TEE to further assess mitral valve regurgitation.  Will check BMET, CBC today.  If mitral valve regurgitation remains severe, recommend referral to structural heart team, she is agreeable to this.  Continue metoprolol, Lasix.  Informed Consent   Shared Decision Making/Informed Consent   The risks [esophageal damage, perforation (1:10,000 risk), bleeding, pharyngeal hematoma as well as other potential complications associated with conscious sedation including aspiration, arrhythmia, respiratory failure and death], benefits (treatment guidance and diagnostic support) and alternatives of a transesophageal echocardiogram were discussed in detail with Ms. Orsini and she is willing to proceed.      2.  CAD: S/p CABG x3 (LIMA-LAD, SVG, RPL, SVG-Circumflex) in 2012. Most recent cath in the setting of NSTEMI on 10/13/2021 revealed new total occlusion of saphenous vein graft to the obtuse marginal, chronic total occlusion of saphenous vein graft to PDA, patent LIMA to the apical LAD which was totaled proximally and distal to the insertion site, 70% left main, 70% followed by 80% proximal and mid LAD, 99% ostial circumflex, 70 to 80% right coronary artery, 95% PDA and normal LV function. Medical therapy recommended. It was noted that if symptoms persisted, PCI of the RCA could be considered.  She reports stable dyspnea denies  any other symptoms concerning for angina.  Continue aspirin, Plavix, metoprolol, Imdur, Lipitor and Zetia.    3. Bilateral lower extremity edema:  Most recent echo as above. She reports stable nonpitting bilateral lower extremity edema, stable mild dyspnea on exertion. Euvolemic and well compensated on exam. Continue Lasix per nephrology (she is on a modified schedule and takes it Monday, Tuesday and Saturday only).    4. Hypertension: BP well controlled. Continue current antihypertensive regimen.    5.. Hyperlipidemia: LDL was 68 in 01/2023.  Continue Lipitor, Zetia.  6. ESRD: On HD TTS. Follows with nephrology.    7. History of TIA/L carotid bruit: No recurrence. Repeat carotid Dopplers in 03/2022 showed 1 to 39% B ICA stenosis. Continue aspirin, Plavix, Lipitor, Zetia.   8. Type 2 diabetes: A1c was 5.4 in 01/2023. Monitored and managed per PCP.     10. Disposition: Follow-up as scheduled with APP in June 2025, sooner if needed, follow-up in 4 to 6 months with Dr. Herbie Baltimore.      Joylene Grapes, NP 04/21/2023, 5:59 PM

## 2023-04-21 ENCOUNTER — Encounter: Payer: Self-pay | Admitting: Nurse Practitioner

## 2023-04-21 LAB — CBC
Hematocrit: 34.6 % (ref 34.0–46.6)
Hemoglobin: 11.2 g/dL (ref 11.1–15.9)
MCH: 32.7 pg (ref 26.6–33.0)
MCHC: 32.4 g/dL (ref 31.5–35.7)
MCV: 101 fL — ABNORMAL HIGH (ref 79–97)
Platelets: 238 10*3/uL (ref 150–450)
RBC: 3.42 x10E6/uL — ABNORMAL LOW (ref 3.77–5.28)
RDW: 14.2 % (ref 11.7–15.4)
WBC: 6.1 10*3/uL (ref 3.4–10.8)

## 2023-04-21 LAB — BASIC METABOLIC PANEL WITH GFR
BUN/Creatinine Ratio: 7 — ABNORMAL LOW (ref 12–28)
BUN: 22 mg/dL (ref 8–27)
CO2: 31 mmol/L — ABNORMAL HIGH (ref 20–29)
Calcium: 8.2 mg/dL — ABNORMAL LOW (ref 8.7–10.3)
Chloride: 97 mmol/L (ref 96–106)
Creatinine, Ser: 3.18 mg/dL — ABNORMAL HIGH (ref 0.57–1.00)
Glucose: 101 mg/dL — ABNORMAL HIGH (ref 70–99)
Potassium: 5.2 mmol/L (ref 3.5–5.2)
Sodium: 140 mmol/L (ref 134–144)
eGFR: 14 mL/min/{1.73_m2} — ABNORMAL LOW (ref >59)

## 2023-04-22 NOTE — Progress Notes (Signed)
 Pt called for pre procedure instructions. Arrival time 0900 NPO after midnight explained. Instructed to take am meds with sip of water. Instructed pt need for ride home tomorrow and have responsible adult with them for 24 hrs post procedure.

## 2023-04-25 ENCOUNTER — Other Ambulatory Visit: Payer: Self-pay

## 2023-04-25 ENCOUNTER — Encounter (HOSPITAL_COMMUNITY)
Admission: RE | Disposition: A | Discharge status: Home / Self Care | Payer: Self-pay | Source: Home / Self Care | Attending: Cardiology

## 2023-04-25 ENCOUNTER — Encounter (HOSPITAL_COMMUNITY): Payer: Self-pay | Admitting: Cardiology

## 2023-04-25 ENCOUNTER — Ambulatory Visit (HOSPITAL_COMMUNITY): Admitting: Anesthesiology

## 2023-04-25 ENCOUNTER — Ambulatory Visit (HOSPITAL_BASED_OUTPATIENT_CLINIC_OR_DEPARTMENT_OTHER)
Admission: RE | Admit: 2023-04-25 | Discharge: 2023-04-25 | Disposition: A | Discharge status: Home / Self Care | Source: Ambulatory Visit | Attending: Nurse Practitioner | Admitting: Nurse Practitioner

## 2023-04-25 ENCOUNTER — Ambulatory Visit (HOSPITAL_COMMUNITY)
Admission: RE | Admit: 2023-04-25 | Discharge: 2023-04-25 | Disposition: A | Discharge status: Home / Self Care | Attending: Cardiology | Admitting: Cardiology

## 2023-04-25 DIAGNOSIS — Z85118 Personal history of other malignant neoplasm of bronchus and lung: Secondary | ICD-10-CM | POA: Diagnosis not present

## 2023-04-25 DIAGNOSIS — Z8673 Personal history of transient ischemic attack (TIA), and cerebral infarction without residual deficits: Secondary | ICD-10-CM | POA: Diagnosis not present

## 2023-04-25 DIAGNOSIS — K219 Gastro-esophageal reflux disease without esophagitis: Secondary | ICD-10-CM | POA: Diagnosis not present

## 2023-04-25 DIAGNOSIS — I081 Rheumatic disorders of both mitral and tricuspid valves: Secondary | ICD-10-CM | POA: Insufficient documentation

## 2023-04-25 DIAGNOSIS — Z923 Personal history of irradiation: Secondary | ICD-10-CM | POA: Diagnosis not present

## 2023-04-25 DIAGNOSIS — Z992 Dependence on renal dialysis: Secondary | ICD-10-CM | POA: Diagnosis not present

## 2023-04-25 DIAGNOSIS — I340 Nonrheumatic mitral (valve) insufficiency: Secondary | ICD-10-CM

## 2023-04-25 DIAGNOSIS — I120 Hypertensive chronic kidney disease with stage 5 chronic kidney disease or end stage renal disease: Secondary | ICD-10-CM | POA: Insufficient documentation

## 2023-04-25 DIAGNOSIS — J189 Pneumonia, unspecified organism: Secondary | ICD-10-CM | POA: Diagnosis not present

## 2023-04-25 DIAGNOSIS — I361 Nonrheumatic tricuspid (valve) insufficiency: Secondary | ICD-10-CM | POA: Diagnosis not present

## 2023-04-25 DIAGNOSIS — R600 Localized edema: Secondary | ICD-10-CM | POA: Diagnosis not present

## 2023-04-25 DIAGNOSIS — I10 Essential (primary) hypertension: Secondary | ICD-10-CM | POA: Diagnosis not present

## 2023-04-25 DIAGNOSIS — Z951 Presence of aortocoronary bypass graft: Secondary | ICD-10-CM | POA: Insufficient documentation

## 2023-04-25 DIAGNOSIS — E1122 Type 2 diabetes mellitus with diabetic chronic kidney disease: Secondary | ICD-10-CM | POA: Insufficient documentation

## 2023-04-25 DIAGNOSIS — E785 Hyperlipidemia, unspecified: Secondary | ICD-10-CM | POA: Diagnosis not present

## 2023-04-25 DIAGNOSIS — E1140 Type 2 diabetes mellitus with diabetic neuropathy, unspecified: Secondary | ICD-10-CM | POA: Diagnosis not present

## 2023-04-25 DIAGNOSIS — I252 Old myocardial infarction: Secondary | ICD-10-CM | POA: Insufficient documentation

## 2023-04-25 DIAGNOSIS — N186 End stage renal disease: Secondary | ICD-10-CM | POA: Insufficient documentation

## 2023-04-25 DIAGNOSIS — Z79899 Other long term (current) drug therapy: Secondary | ICD-10-CM | POA: Insufficient documentation

## 2023-04-25 DIAGNOSIS — I2510 Atherosclerotic heart disease of native coronary artery without angina pectoris: Secondary | ICD-10-CM | POA: Insufficient documentation

## 2023-04-25 DIAGNOSIS — I272 Pulmonary hypertension, unspecified: Secondary | ICD-10-CM | POA: Insufficient documentation

## 2023-04-25 DIAGNOSIS — Z85528 Personal history of other malignant neoplasm of kidney: Secondary | ICD-10-CM | POA: Insufficient documentation

## 2023-04-25 DIAGNOSIS — Q2112 Patent foramen ovale: Secondary | ICD-10-CM | POA: Insufficient documentation

## 2023-04-25 LAB — ECHO TEE
AV Mean grad: 2 mmHg
AV Peak grad: 3.4 mmHg
Ao pk vel: 0.93 m/s
MV M vel: 4.77 m/s
MV Peak grad: 91 mmHg
Radius: 0.3 cm

## 2023-04-25 SURGERY — TRANSESOPHAGEAL ECHOCARDIOGRAM (TEE) (CATHLAB)
Anesthesia: Monitor Anesthesia Care

## 2023-04-25 MED ORDER — PHENYLEPHRINE HCL (PRESSORS) 10 MG/ML IV SOLN
INTRAVENOUS | Status: DC | PRN
Start: 2023-04-25 — End: 2023-04-25
  Administered 2023-04-25: 80 ug via INTRAVENOUS

## 2023-04-25 MED ORDER — GLYCOPYRROLATE 0.2 MG/ML IJ SOLN
INTRAMUSCULAR | Status: DC | PRN
  Administered 2023-04-25: .2 mg via INTRAVENOUS

## 2023-04-25 MED ORDER — SODIUM CHLORIDE 0.9% FLUSH: 3.0000 mL | INTRAVENOUS | Status: DC | PRN

## 2023-04-25 MED ORDER — PROPOFOL 10 MG/ML IV BOLUS
INTRAVENOUS | Status: DC | PRN
  Administered 2023-04-25: 60 mg via INTRAVENOUS
  Administered 2023-04-25: 20 mg via INTRAVENOUS
  Administered 2023-04-25: 100 ug/kg/min via INTRAVENOUS

## 2023-04-25 MED ORDER — SODIUM CHLORIDE 0.9% FLUSH
3.0000 mL | Freq: Two times a day (BID) | INTRAVENOUS | Status: DC
Start: 2023-04-25 — End: 2023-04-25

## 2023-04-25 MED ORDER — LIDOCAINE 2% (20 MG/ML) 5 ML SYRINGE
INTRAMUSCULAR | Status: DC | PRN
  Administered 2023-04-25: 60 mg via INTRAVENOUS

## 2023-04-25 NOTE — Anesthesia Preprocedure Evaluation (Addendum)
 Anesthesia Evaluation  Patient identified by MRN, date of birth, ID band Patient awake    Reviewed: Allergy & Precautions, NPO status , Patient's Chart, lab work & pertinent test results, reviewed documented beta blocker date and time   Airway Mallampati: III       Dental no notable dental hx. (+) Teeth Intact, Dental Advisory Given   Pulmonary shortness of breath, asthma , pneumonia, resolved   Pulmonary exam normal        Cardiovascular hypertension, Pt. on medications and Pt. on home beta blockers + angina with exertion + CAD, + Past MI, + CABG and + DOE  + Valvular Problems/Murmurs MR  Rhythm:Regular Rate:Normal + Systolic murmurs    Neuro/Psych TIACVA, No Residual Symptoms  negative psych ROS   GI/Hepatic Neg liver ROS,GERD  Medicated,,  Endo/Other  diabetes, Well Controlled, Type 2  HLD   Renal/GU ESRF and DialysisRenal disease  negative genitourinary   Musculoskeletal  (+) Arthritis , Osteoarthritis,    Abdominal   Peds  Hematology  (+) Blood dyscrasia, anemia   Anesthesia Other Findings   Reproductive/Obstetrics                             Anesthesia Physical Anesthesia Plan  ASA: 4  Anesthesia Plan: MAC   Post-op Pain Management: Minimal or no pain anticipated   Induction: Intravenous  PONV Risk Score and Plan: 2 and Propofol infusion and Treatment may vary due to age or medical condition  Airway Management Planned: Natural Airway and Nasal Cannula  Additional Equipment: None  Intra-op Plan:   Post-operative Plan:   Informed Consent: I have reviewed the patients History and Physical, chart, labs and discussed the procedure including the risks, benefits and alternatives for the proposed anesthesia with the patient or authorized representative who has indicated his/her understanding and acceptance.     Dental advisory given  Plan Discussed with: CRNA and  Anesthesiologist  Anesthesia Plan Comments:         Anesthesia Quick Evaluation

## 2023-04-25 NOTE — Transfer of Care (Addendum)
 Immediate Anesthesia Transfer of Care Note  Patient: Rea Kalama  Procedure(s) Performed: TRANSESOPHAGEAL ECHOCARDIOGRAM  Patient Location: Cath Lab  Anesthesia Type:General  Level of Consciousness: awake, alert , and oriented  Airway & Oxygen Therapy: Patient Spontanous Breathing and Patient connected to face mask oxygen  Post-op Assessment: Report given to RN and Post -op Vital signs reviewed and stable  Post vital signs: Reviewed and stable  Last Vitals:  Vitals Value Taken Time  BP 108/46 04/25/23  Temp    Pulse 84 04/25/23  Resp 16 04/25/23  SpO2 93% 04/25/23    Last Pain:  Vitals:   04/25/23 1002  TempSrc:   PainSc: 0-No pain         Complications: No notable events documented.

## 2023-04-25 NOTE — CV Procedure (Signed)
   Transesophageal Echocardiogram  Indications: Mitral regurgitation  Time out performed  During this procedure the patient was administered propofol under anesthesiology supervision to achieve and maintain moderate sedation.  The patient's heart rate, blood pressure, and oxygen saturation are monitored continuously during the procedure.   Findings:  Left Ventricle: Normal ejection fraction 65%  Mitral Valve: Moderate posterior leaflet prolapse, moderate mitral annular calcification, small ruptured chord.  There appears to be moderate eccentric mitral regurgitation.  There is no pulmonary vein flow reversal, Pisa radius 0.3.  Aortic Valve: Trileaflet normal  Tricuspid Valve: Severe tricuspid regurgitation with severe pulmonary hypertension 65 mmHg  Left Atrium: Normal, no left atrial appendage thrombus  Right Atrium: Mildly dilated  Intraatrial septum: Small PFO  Bubble Contrast Study: Positive bubble study with small PFO, mild bubble crossover early.  Dana Gathers, MD

## 2023-04-25 NOTE — Anesthesia Postprocedure Evaluation (Signed)
 Anesthesia Post Note  Patient: Dana Jackson  Procedure(s) Performed: TRANSESOPHAGEAL ECHOCARDIOGRAM     Patient location during evaluation: PACU Anesthesia Type: MAC Level of consciousness: awake and alert and oriented Pain management: pain level controlled Vital Signs Assessment: post-procedure vital signs reviewed and stable Respiratory status: spontaneous breathing, nonlabored ventilation and respiratory function stable Cardiovascular status: stable and blood pressure returned to baseline Postop Assessment: no apparent nausea or vomiting Anesthetic complications: no   No notable events documented.  Last Vitals:  Vitals:   04/25/23 1057 04/25/23 1100  BP: (!) 83/44 (!) 91/56  Pulse: 84 83  Resp: 14 12  Temp: (!) 35.6 C   SpO2: 94% 94%    Last Pain:  Vitals:   04/25/23 1100  TempSrc:   PainSc: 0-No pain                 Coreon Simkins A.

## 2023-04-25 NOTE — Interval H&P Note (Signed)
 History and Physical Interval Note:  04/25/2023 10:20 AM  Dana Jackson  has presented today for surgery, with the diagnosis of MVR.  The various methods of treatment have been discussed with the patient and family. After consideration of risks, benefits and other options for treatment, the patient has consented to  Procedure(s): TRANSESOPHAGEAL ECHOCARDIOGRAM (N/A) as a surgical intervention.  The patient's history has been reviewed, patient examined, no change in status, stable for surgery.  I have reviewed the patient's chart and labs.  Questions were answered to the patient's satisfaction.     [employer]

## 2023-06-19 NOTE — Progress Notes (Unsigned)
 Office Visit    Patient Name: Dana Jackson Date of Encounter: 06/20/2023  Primary Care Provider:  Jenell Mirza, PA-C Primary Cardiologist:  Randene Bustard, MD  Chief Complaint    78 year old female with a history of CAD s/p CABG x3 (LIMA-LAD, SVG, RPL, SVG-Circumflex) in 2012, mitral valve regurgitation, tricuspid valve regurgitation, hypertension, hyperlipidemia, ESRD on HD, TIA, renal cell carcinoma s/p partial left nephrectomy, lung cancer s/p radiation therapy, and type 2 diabetes who presents for follow-up related to CAD and mitral valve regurgitation.   Past Medical History    Past Medical History:  Diagnosis Date   Anemia    Arthritis    hands   Asthma    childhood   Bacteremia due to Escherichia coli 06/2013   Currently being treated with anti-biotics   CAD in native artery 12/2010   a) Cath for exertional angina & EKG changes: 40% LM, 80% mid LAD.  95% ost cX, 80-90% PDA --> CABG X3; b) Post CABG CATH for + Myoview with basal anterior ischemia -> Ost LAD 80% (diffuse) then 100% after SP2, RI 70% (too small for PCI), Ost-prox Cx 99% & OM1 90%, OstrPDA 80% (small); Occluded SVG-rPDA.  Patent LIMA-dLAD, SVG-OM: culprit ~ p-m LAD pre-LIMA & RI - not good PCI target --> Med Rx   CKD (chronic kidney disease) stage 3, GFR 30-59 ml/min (HCC)    Degenerative disc disease, cervical    Degenerative disc disease, cervical [722.4]   Diabetes mellitus without complication (HCC)    Type II   Dyspnea    Endometrial cancer (HCC) 11/2013   Treated with TAH with pelvic lymphadenectomy followed by radiation and chemotherapy   GERD (gastroesophageal reflux disease)    Hearing loss    bilateral - no hearing aids   Heart murmur    History of asthma     childhood   History of blood transfusion    History of pneumonia    "2-3 times"   History of unstable angina November/December 2012   T wave inversions in inferolateral leads.  No stress test performed.   Hyperlipidemia LDL  goal <70    Hypertension, essential, benign    Neuropathy    hands   Pneumonia 2018   Renal cell carcinoma (HCC) 11/18/2008   T2aNx s/p partial left nephrectomy   S/P CABG x 3 12/2010   LIMA-LAD, SVG RPL, SVG-Circumflex   Stroke (HCC)    TIA- no residual    TIA (transient ischemic attack) 1992 &  2010   Past Surgical History:  Procedure Laterality Date   ANTERIOR CERVICAL DECOMP/DISCECTOMY FUSION  10/11/2005   multi-level   AV FISTULA PLACEMENT Left 08/03/2017   Procedure: ARTERIOVENOUS (AV) FISTULA CREATION LEFT ARM;  Surgeon: Mayo Speck, MD;  Location: [facility];  Service: Vascular;  Laterality: Left;   BRONCHIAL BIOPSY  12/30/2020   Procedure: BRONCHIAL BIOPSIES;  Surgeon: Prudy Brownie, DO;  Location: [facility];  Service: Pulmonary;;   BRONCHIAL BIOPSY  02/03/2021   Procedure: BRONCHIAL BIOPSIES;  Surgeon: Prudy Brownie, DO;  Location: [facility];  Service: Pulmonary;;   BRONCHIAL BRUSHINGS  12/30/2020   Procedure: BRONCHIAL BRUSHINGS;  Surgeon: Prudy Brownie, DO;  [facility]: [facility];  Service: Pulmonary;;   BRONCHIAL BRUSHINGS  02/03/2021   Procedure: BRONCHIAL BRUSHINGS;  Surgeon: Prudy Brownie, DO;  Location: [facility];  Service: Pulmonary;;   BRONCHIAL NEEDLE ASPIRATION BIOPSY  12/30/2020   Procedure: BRONCHIAL NEEDLE ASPIRATION BIOPSIES;  Surgeon: Prudy Brownie, DO;  [facility]: [facility];  Service: Pulmonary;;   BRONCHIAL NEEDLE ASPIRATION BIOPSY  02/03/2021   Procedure: BRONCHIAL NEEDLE ASPIRATION BIOPSIES;  Surgeon: Prudy Brownie, DO;  Location: [facility];  Service: Pulmonary;;   CARDIAC CATHETERIZATION  12/24/10   40% left main, 80% mid LAD, 95% ostial circumflex, 80-90% PDA.   CARDIAC CATHETERIZATION N/A 07/01/2014   Procedure: Left Heart Cath and Cors/Grafts Angiography;  Surgeon: Arleen Lacer, MD;  Location: [facility] CV LAB:  For Abn Nuc @ [facility]: Ost LAD 80%, mid LAD 100% after S/P 2, 70% RI (no PCI target), ost-prox Cx 99%, OM1 90%.  Ost rPDA 80% (~ pre-CABG), occluded SVG-rPDA, patent LIMA-dLAD, SVG OM; potential culprit for abn Nuc scan = pLAD Dz, small RI 70% or PDA -> med Rx   CAROTID ENDARTERECTOMY Left    CHOLECYSTECTOMY  ~ 1971   COLONOSCOPY     CORONARY ARTERY BYPASS GRAFT  12/25/2010   Procedure: CORONARY ARTERY BYPASS GRAFTING (CABGX3 - LIMA-LAD, SVG RPL, SVG-Circumflex);  Surgeon: Gardenia Jump, MD;  Location: [facility] OR;  Service: Open Heart Surgery;  Laterality: N/A;   DILATATION & CURETTAGE/HYSTEROSCOPY WITH MYOSURE N/A 11/02/2013   Procedure: DILATATION & CURETTAGE/HYSTEROSCOPY WITH MYOSURE ABLATION;  Surgeon: Jeanmarie Millet, MD;  Location: [facility] ORS;  Service: Gynecology;  Laterality: N/A;   DOPPLER ECHOCARDIOGRAPHY  12/08/2010   EF =>55%,MILD CONCENTRIC LEFT VENTRICULAR HYPERTROPHY   EYE SURGERY Bilateral    Cataract   IR DIALY SHUNT INTRO NEEDLE/INTRACATH INITIAL W/IMG LEFT Left 04/24/2019   IR FLUORO GUIDE CV LINE LEFT  04/26/2019   IR US  GUIDE VASC ACCESS LEFT  04/24/2019   IR US  GUIDE VASC ACCESS LEFT  04/26/2019   LEFT HEART CATH AND CORS/GRAFTS ANGIOGRAPHY N/A 10/13/2021   Procedure: LEFT HEART CATH AND CORS/GRAFTS ANGIOGRAPHY;  Surgeon: Arty Binning, MD;  [facility];  Service: Cardiovascular;  Laterality: N/A;   LEFT HEART CATHETERIZATION WITH CORONARY ANGIOGRAM N/A 12/24/2010   Procedure: LEFT HEART CATHETERIZATION WITH CORONARY ANGIOGRAM;  Surgeon: Arleen Lacer, MD;  Location: [facility] CATH LAB;  Service: Cardiovascular;  Laterality: N/A;   NM MYOCAR SINGLE W/SPECT  07/26/2007   EF 79%, LEFT VENT.FUNCTION NORMAL   PARTIAL NEPHRECTOMY Left 11/18/2008   left partial nephrectomy for renal cell CA   PET Myocardial Perfusion Scan  06/13/2014   At [facility]: Moderate size, mild severity completely reversible defect involving the basal anterior, mid anterior and apical anterior segments consistent with ischemia. EF 65% with normal global function.   TONSILLECTOMY     "in college"    TOTAL ABDOMINAL HYSTERECTOMY  November 2015    At [facility]: Robotic procedure with pelvic lymphadenectomy   TRANSESOPHAGEAL ECHOCARDIOGRAM (CATH LAB) N/A 04/25/2023   Procedure: TRANSESOPHAGEAL ECHOCARDIOGRAM;  Surgeon: Hugh Madura, MD;  Location: [facility] INVASIVE CV LAB;  Service: Cardiovascular;  Laterality: N/A;   TRANSTHORACIC ECHOCARDIOGRAM  06/13/2014   At [facility]: EF 60-65%. Grade 1 diastolic dysfunction. Mild MR. Aortic sclerosis. Moderate pulmonary hypertension   TUBAL LIGATION  ~ 1984   UPPER GI ENDOSCOPY     VIDEO BRONCHOSCOPY WITH RADIAL ENDOBRONCHIAL ULTRASOUND  12/30/2020   Procedure: RADIAL ENDOBRONCHIAL ULTRASOUND;  Surgeon: Prudy Brownie, DO;  Location: [facility];  Service: Pulmonary;;   VIDEO BRONCHOSCOPY WITH RADIAL ENDOBRONCHIAL ULTRASOUND  02/03/2021   Procedure: RADIAL ENDOBRONCHIAL ULTRASOUND;  Surgeon: Prudy Brownie, DO;  Location: [facility];  Service: Pulmonary;;   WISDOM TOOTH EXTRACTION      Allergies  Allergies  Allergen Reactions   Sulfa Antibiotics Other (See Comments)    "AKI"    Erythromycin Itching   Pravastatin Other (See Comments)    Unknown reaction   Simvastatin  Other (See Comments)    Reaction not known   Codeine Nausea Only and Other (See Comments)    Tolerate Hycodan   Gemfibrozil Itching     Labs/Other Studies Reviewed    The following studies were reviewed today:  Cardiac Studies & Procedures   ______________________________________________________________________________________________ CARDIAC CATHETERIZATION  CARDIAC CATHETERIZATION 10/13/2021  Conclusion CONCLUSIONS: New culprit total occlusion of SVG to the obtuse marginal Chronic total occlusion of SVG to PDA Patent LIMA to apical LAD which is totally occluded proximal to the graft insertion site and in the apical segment beyond the graft and insertion site. 70% distal left main. 70% ostial LAD and sequential 80% stenoses in the proximal and mid  LAD Ostial 95 to 99% heavily calcified circumflex with severe diffuse disease in the dominant obtuse marginal. Moderate mid RCA disease and 70 to 80% distal disease.  95% stenosis in the PDA and 70% proximal LV branch disease. Preserved LV function with EF approximately 50 to 60% with EDP 12 mmHg.   RECOMMENDATIONS:  There are no good PCI or surgical options.  We will have other team members to look at pictures and give their opinion.  If refractory angina unresponsive to medical therapy we could treat the right coronary however it is the least severely diseased vessel of the 3 major epicardial coronaries.  I do not believe repeat surgery is an option at her age and with end-stage kidney disease.  Findings Coronary Findings Diagnostic  Dominance: Right  Left Main There is severe diffuse disease throughout the vessel. Ost LM to Ost LAD lesion is 70% stenosed.  Left Anterior Descending There is severe diffuse disease throughout the vessel. Prox LAD to Mid LAD lesion is 70% stenosed. Mid LAD lesion is 80% stenosed. Dist LAD-1 lesion is 100% stenosed. Dist LAD-2 lesion is 100% stenosed.  Left Circumflex There is moderate diffuse disease throughout the vessel. Ost Cx to Prox Cx lesion is 99% stenosed.  First Obtuse Marginal Branch 1st Mrg lesion is 100% stenosed.  Second Obtuse Marginal Branch There is mild disease in the vessel. 2nd Mrg-1 lesion is 95% stenosed. 2nd Mrg-2 lesion is 90% stenosed.  Right Coronary Artery There is moderate diffuse disease throughout the vessel. Mid RCA lesion is 60% stenosed. Dist RCA lesion is 70% stenosed.  Right Posterior Descending Artery RPDA lesion is 99% stenosed.  First Right Posterolateral Branch 1st RPL lesion is 90% stenosed.  Graft To RPDA Origin to Prox Graft lesion is 100% stenosed.  Graft To 2nd Mrg Origin lesion is 100% stenosed.  LIMA Graft To Dist LAD  Intervention  No interventions have been  documented.   CARDIAC CATHETERIZATION  CARDIAC CATHETERIZATION 07/01/2014  Conclusion Images from the original result were not included. 1. Severe multivessel native CAD with 2 of 3 grafts patent and occluded SVG-rPDA. Patent LIMA-~dLAD, SVG-OM. Occluded SVG-rPDA at the origin 2. Ost LAD lesion, 80% stenosed. Mid LAD lesion, 100% stenosed after 2nd septal perforator. The proximal-mid LAD is at least moderately diseased 3. Ramus lesion, 70% stenosed - small caliber, non-PCI amenable 4. Ost Cx to Prox Cx lesion, 99% stenosed. 1st Mrg lesion, 90% stenosed. 5. Ost RPDA to RPDA lesion, 80% stenosed. Similar to pre-CABG. < 2 mm vessel. 6. Potential culprit for abnormal Nuclear Stress Test is the diffusely diseased proximal-mid LAD with  diseased septal perforators from LAD & rPDA  No PCI target available to treat the "anterior ischemia" in an asymptomatic patient.  Recommendations:  Post Femoral cath with sheath removal  Aggressive IV fluid hydration overnight  Overnight - outpatient in bed for post cath hydration given CKD.  Continue current med Rx.  Will need to check CMP at the end of the week.   Arleen Lacer, M.D., M.S. Interventional Cardiologist  Pager # [phone]  Findings Coronary Findings Diagnostic  Dominance: Right  Left Main The vessel is large .  Left Anterior Descending There is severe the vessel.  calcified chronic total occlusion .  First Septal Branch The vessel is small in size.  Second Septal Branch The vessel is small in size.  Ramus Intermedius The vessel is small . tubular eccentric .   Tapered to small branch.  Lateral Ramus Intermedius The vessel is small in size.  Left Circumflex The vessel is large . diffuse located at the major branch . discrete .  At this point, the vessel is small caliber  First Obtuse Marginal Branch tubular .  Competitive flow from SVG  Lateral First Obtuse Marginal Branch The vessel is small in  size.  Third Obtuse Marginal Branch The vessel is small in size.  Right Coronary Artery The vessel is large . tubular .  Right Posterior Descending Artery The vessel is small in size. diffuse .  Previously grafted, small caliber vessel.  &lt; 2 mm  Right Posterior Atrioventricular Artery The vessel is moderate in size.  First Right Posterolateral Branch The vessel is moderate in size.  Second Right Posterolateral Branch The vessel is small in size.  Third Right Posterolateral Branch The vessel is moderate in size.  LIMA LIMA Graft To Dist LAD LIMA is and is anatomically normal. Tortuous graft to mid-distal LAD.  Single Graft Graft To 1st Mrg SVG is normal in caliber. There is competitive flow.  Single Graft Graft To RPDA SVG chronic total occlusion .  Intervention  No interventions have been documented.   STRESS TESTS  MYOCARDIAL PERFUSION IMAGING 07/26/2007   ECHOCARDIOGRAM  ECHOCARDIOGRAM COMPLETE 04/08/2023  Narrative ECHOCARDIOGRAM REPORT    Patient Name:   Dana SAYEGH Date of Exam: 04/08/2023 Medical Rec #:  [id number]      Height:       60.0 in Accession #:    [id number]     Weight:       140.4 lb Date of Birth:  01/16/1945     BSA:          1.606 m Patient Age:    84 years       BP:           131/70 mmHg Patient Gender: F              HR:           81 bpm. Exam Location:  [address]  Procedure: 2D Echo, Cardiac Doppler and Color Doppler (Both Spectral and Color Flow Doppler were utilized during procedure).  Indications:    I25.10 CAD  History:        Patient has prior history of Echocardiogram examinations, most recent 10/13/2021. CAD, Prior CABG, Stroke, Signs/Symptoms:Murmur, Shortness of Breath and Dyspnea; Risk Factors:Diabetes.  Sonographer:    Brigid Canada RDCS Referring Phys: 63 DAVID W HARDING  IMPRESSIONS   1. Left ventricular ejection fraction, by estimation, is 60 to 65%. Left ventricular ejection fraction by  PLAX is 63 %. The left ventricle  has normal function. The left ventricle has no regional wall motion abnormalities. Left ventricular diastolic parameters are consistent with Grade II diastolic dysfunction (pseudonormalization). Elevated left ventricular end-diastolic pressure. 2. Right ventricular systolic function is mildly reduced. The right ventricular size is normal. 3. The mitral valve is degenerative. Moderate to more likely severe, eccentric mitral valve regurgitation with splay artifact directed toward the interatrial septum - swirling of the regurgitant jet is noted in the left atrium. Mild mitral stenosis. The mean mitral valve gradient is 5.0 mmHg with average heart rate of 81 bpm. 4. The aortic valve is tricuspid. Aortic valve regurgitation is not visualized. Aortic valve sclerosis/calcification is present, without any evidence of aortic stenosis.  Comparison(s): Prior images reviewed side by side. Changes from prior study are noted. 10/13/2021: LVEF 60-65%, mild MR with MAC.  Conclusion(s)/Recommendation(s): Would consider further evaluation of the severity of mitral regurgitation with TEE.  FINDINGS Left Ventricle: Left ventricular ejection fraction, by estimation, is 60 to 65%. Left ventricular ejection fraction by PLAX is 63 %. The left ventricle has normal function. The left ventricle has no regional wall motion abnormalities. The left ventricular internal cavity size was normal in size. There is no left ventricular hypertrophy. Left ventricular diastolic parameters are consistent with Grade II diastolic dysfunction (pseudonormalization). Elevated left ventricular end-diastolic pressure.  Right Ventricle: The right ventricular size is normal. No increase in right ventricular wall thickness. Right ventricular systolic function is mildly reduced.  Left Atrium: Left atrial size was normal in size.  Right Atrium: Right atrial size was normal in size.  Pericardium: There is no  evidence of pericardial effusion.  The mitral valve is degenerative in appearance. There is moderate calcification of the posterior and anterior mitral valve leaflet(s). Mild to moderate mitral annular calcification. Moderate to severe mitral valve regurgitation, with wall-impinging jet. Mild mitral valve stenosis. Tricuspid Valve: The tricuspid valve is grossly normal. Tricuspid valve regurgitation is mild.  Aortic Valve: The aortic valve is tricuspid. Aortic valve regurgitation is not visualized. Aortic valve sclerosis/calcification is present, without any evidence of aortic stenosis.  Pulmonic Valve: The pulmonic valve was grossly normal. Pulmonic valve regurgitation is trivial.  Aorta: The aortic root and ascending aorta are structurally normal, with no evidence of dilitation.  IAS/Shunts: No atrial level shunt detected by color flow Doppler.   LEFT VENTRICLE PLAX 2D LV EF:         Left            Diastology ventricular     LV e' medial:    5.04 cm/s ejection        LV E/e' medial:  28.8 fraction by     LV e' lateral:   7.46 cm/s PLAX is 63      LV E/e' lateral: 19.5 %. LVIDd:         4.40 cm LVIDs:         2.90 cm LV PW:         1.00 cm LV IVS:        1.00 cm LVOT diam:     2.00 cm LV SV:         69 LV SV Index:   43 LVOT Area:     3.14 cm   RIGHT VENTRICLE            IVC RV Basal diam:  3.40 cm    IVC diam: 0.90 cm RV S prime:     8.21 cm/s TAPSE (M-mode): 2.1 cm  LEFT ATRIUM  Index        RIGHT ATRIUM          Index LA diam:        4.20 cm 2.62 cm/m   RA Area:     9.70 cm LA Vol (A2C):   32.1 ml 19.99 ml/m  RA Volume:   18.20 ml 11.33 ml/m LA Vol (A4C):   47.0 ml 29.27 ml/m LA Biplane Vol: 40.4 ml 25.16 ml/m AORTIC VALVE LVOT Vmax:   102.30 cm/s LVOT Vmean:  69.933 cm/s LVOT VTI:    0.220 m  AORTA Ao Root diam: 3.10 cm Ao Asc diam:  3.60 cm  MITRAL VALVE                TRICUSPID VALVE MV Area (PHT): 3.50 cm     TR Peak grad:   37.9  mmHg MV Area VTI:   1.70 cm     TR Vmax:        308.00 cm/s MV Peak grad:  8.3 mmHg MV Mean grad:  5.0 mmHg     SHUNTS MV Vmax:       1.44 m/s     Systemic VTI:  0.22 m MV Vmean:      109.0 cm/s   Systemic Diam: 2.00 cm MV Decel Time: 217 msec MR Peak grad: 90.2 mmHg MR Mean grad: 67.5 mmHg MR Vmax:      475.00 cm/s MR Vmean:     397.5 cm/s MV E velocity: 145.50 cm/s MV A velocity: 127.50 cm/s MV E/A ratio:  1.14  Dinah Franco MD Electronically signed by Dinah Franco MD Signature Date/Time: 04/08/2023/1:51:25 PM    Final   TEE  ECHO TEE 04/25/2023  Narrative TRANSESOPHOGEAL ECHO REPORT    Patient Name:   QUENNA DOEPKE Date of Exam: 04/25/2023 Medical Rec #:  [id number]      Height:       60.0 in Accession #:    [id number]     Weight:       134.8 lb Date of Birth:  29-Jul-1945     BSA:          1.578 m Patient Age:    77 years       BP:           150/70 mmHg Patient Gender: F              HR:           89 bpm. Exam Location:  Outpatient  Procedure: 3D Echo, Cardiac Doppler and Color Doppler (Both Spectral and Color Flow Doppler were utilized during procedure).  Indications:    Mitral Regurgitation  History:        Patient has prior history of Echocardiogram examinations, most recent 10/13/2021.  Sonographer:    Hersey Lorenzo RDCS Referring Phys: [id number] Tashi Andujo C Temekia Caskey  PROCEDURE: The transesophogeal probe was passed without difficulty through the esophogus of the patient. Sedation performed by different physician. The patient's vital signs; including heart rate, blood pressure, and oxygen saturation; remained stable throughout the procedure. The patient developed no complications during the procedure.  IMPRESSIONS   1. Left ventricular ejection fraction, by estimation, is 60 to 65%. The left ventricle has normal function. The left ventricle has no regional wall motion abnormalities. 2. Right ventricular systolic function is normal. The right ventricular size is  normal. There is severely elevated pulmonary artery systolic pressure. The estimated right ventricular systolic pressure is 68.3 mmHg. 3. No left atrial/left atrial appendage thrombus  was detected. 4. PISA radius 0.3 mm. The mitral valve is myxomatous. Moderate mitral valve regurgitation. No evidence of mitral stenosis. There is moderate holosystolic prolapse of the middle scallop of the posterior leaflet of the mitral valve. Moderate mitral annular calcification. 5. Tricuspid valve regurgitation is severe. 6. The aortic valve is normal in structure. Aortic valve regurgitation is not visualized. No aortic stenosis is present. 7. There is Moderate (Grade III) plaque. 8. The inferior vena cava is normal in size with greater than 50% respiratory variability, suggesting right atrial pressure of 3 mmHg. 9. Evidence of atrial level shunting detected by color flow Doppler. Agitated saline contrast bubble study was positive with shunting observed within 3-6 cardiac cycles suggestive of interatrial shunt. There is a small patent foramen ovale. 10. 3D performed of the mitral valve and demonstrates Moderate MR, moderate MVP.  FINDINGS Left Ventricle: Left ventricular ejection fraction, by estimation, is 60 to 65%. The left ventricle has normal function. The left ventricle has no regional wall motion abnormalities. The left ventricular internal cavity size was normal in size. There is no left ventricular hypertrophy.  Right Ventricle: The right ventricular size is normal. No increase in right ventricular wall thickness. Right ventricular systolic function is normal. There is severely elevated pulmonary artery systolic pressure. The tricuspid regurgitant velocity is 4.04 m/s, and with an assumed right atrial pressure of 3 mmHg, the estimated right ventricular systolic pressure is 68.3 mmHg.  Left Atrium: Left atrial size was normal in size. No left atrial/left atrial appendage thrombus was detected.  Right  Atrium: Right atrial size was normal in size.  Pericardium: There is no evidence of pericardial effusion.  Mitral Valve: PISA radius 0.3 mm. The mitral valve is myxomatous. There is moderate holosystolic prolapse of the middle scallop of the posterior leaflet of the mitral valve. Moderate mitral annular calcification. Moderate mitral valve regurgitation, with eccentric anteriorly directed jet. No evidence of mitral valve stenosis.  Tricuspid Valve: The tricuspid valve is normal in structure. Tricuspid valve regurgitation is severe. No evidence of tricuspid stenosis.  Aortic Valve: The aortic valve is normal in structure. Aortic valve regurgitation is not visualized. No aortic stenosis is present. Aortic valve mean gradient measures 2.0 mmHg. Aortic valve peak gradient measures 3.4 mmHg.  Pulmonic Valve: The pulmonic valve was normal in structure. Pulmonic valve regurgitation is not visualized. No evidence of pulmonic stenosis.  Aorta: The aortic root is normal in size and structure. There is moderate (Grade III) plaque.  Venous: The inferior vena cava is normal in size with greater than 50% respiratory variability, suggesting right atrial pressure of 3 mmHg.  IAS/Shunts: Evidence of atrial level shunting detected by color flow Doppler. Agitated saline contrast was given intravenously to evaluate for intracardiac shunting. Agitated saline contrast bubble study was positive with shunting observed within 3-6 cardiac cycles suggestive of interatrial shunt. A small patent foramen ovale is detected.  Additional Comments: 3D was performed not requiring image post processing on an independent workstation and was abnormal.  AORTIC VALVE AV Vmax:      92.60 cm/s AV Vmean:     61.600 cm/s AV VTI:       0.218 m AV Peak Grad: 3.4 mmHg AV Mean Grad: 2.0 mmHg  AORTA Ao Asc diam: 3.40 cm  MR Peak grad:    91.0 mmHg    TRICUSPID VALVE MR Mean grad:    70.0 mmHg    TR Peak grad:   65.3 mmHg MR Vmax:  477.00 cm/s  TR Vmax:        404.00 cm/s MR Vmean:        396.0 cm/s MR PISA:         0.57 cm MR PISA Eff ROA: 4 mm MR PISA Radius:  0.30 cm  Dorothye Gathers MD Electronically signed by Dorothye Gathers MD Signature Date/Time: 04/25/2023/11:32:31 AM    Final        ______________________________________________________________________________________________     Recent Labs: 04/20/2023: BUN 22; Creatinine, Ser 3.18; Hemoglobin 11.2; Platelets 238; Potassium 5.2; Sodium 140  Recent Lipid Panel    Component Value Date/Time   CHOL 115 02/19/2022 0713   CHOL 170 11/09/2021 0944   TRIG 132 02/19/2022 0713   HDL 30 (L) 02/19/2022 0713   HDL 36 (L) 11/09/2021 0944   CHOLHDL 3.8 02/19/2022 0713   VLDL 26 02/19/2022 0713   LDLCALC 59 02/19/2022 0713   LDLCALC 102 (H) 11/09/2021 0944    History of Present Illness    78 year old female with the above past medical history including CAD s/p CABG x3 (LIMA-LAD, SVG, RPL, SVG-Circumflex) in 2012, mitral valve regurgitation, tricuspid valve regurgitation, hypertension, hyperlipidemia, ESRD on HD, TIA, renal cell carcinoma s/p partial left nephrectomy, lung cancer s/p radiation therapy, and type 2 diabetes.   She has a history of CAD s/p CABG x3 (LIMA-LAD, SVG, RPL, SVG-Circumflex) in 2012.  She had an abnormal cardiac PET stress test in 2016.  Cardiac catheterization in June 2016 revealed occluded SVG with severe ostial PDA disease, moderate mid RCA disease with stable flow to the post anterolateral system, patent LIMA to diffusely diseased LAD and patent vein graft to OM1 with moderate disease in the AV groove circumflex lesion OM 2.  She was hospitalized in 10/2021 in the setting of NSTEMI. 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 Repeat cardiac catheterization at the time revealed new total occlusion of saphenous vein graft to the  obtuse marginal, chronic total occlusion of saphenous vein graft to PDA, patent LIMA to the apical LAD which was totaled proximally and distal to the insertion site, 70% left main, 70% followed by 80% proximal and mid LAD, 99% ostial circumflex, 70 to 80% right coronary artery, 95% PDA and normal LV function.  Medical therapy was recommended.  It was noted that if symptoms persisted, PCI of the RCA could be considered.  Unfortunately, she was hospitalized again in 10/2021 in the setting of septic shock due to pneumonia. MBS per speech therapy which showed no evidence of aspiration. CT showed stable bilateral pulmonary nodules, incidental finding of 1.7 cystic lesion in the body of pancreas.  Follow-up MRI was recommended in 6 months. She was hospitalized in February 2024 in the setting of acute hypoxic respiratory failure due to COVID-19, chest pain.  Troponin was elevated.  No further intervention was recommended per cardiology. Repeat carotid Dopplers in 03/2022 showed 1 to 39% B ICA stenosis.  Repeat echocardiogram in 03/2023 showed EF 60 to 65%, normal LV function, no RWMA, G2 DD, elevated LVEDP, mildly reduced RV systolic function, severe eccentric mitral valve regurgitation with splay artifact directed towards the end dry atrial septum, swirling of the regurgitant jet noted in the left atrium, mild mitral stenosis.  Follow-up TEE was recommended with possible referral to structural heart team. She was last seen in the office on 04/20/2023 and was stable from a cardiac standpoint.  She underwent TEE  on 04/25/2023 which revealed 65%, moderate posterior leaflet prolapse, moderate mitral annular calcification, small ruptured cord, moderate eccentric mitral valve regurgitation, severe tricuspid valve regurgitation with severe pulmonary hypertension, 65 mmHg, mildly dilated right atrium, small PFO.   She presents today for follow-up accompanied by her husband.  Since her last visit she has done well from a cardiac  standpoint.  She notes stable occasional dyspnea on exertion. She denies any progressive dyspnea, denies chest pain, palpitations, dizziness, edema, PND, with apnea, weight gain.  Overall, she reports feeling well.    Home Medications    Current Outpatient Medications  Medication Sig Dispense Refill   acetaminophen  (TYLENOL ) 500 MG tablet Take 1,000 mg by mouth every 6 (six) hours as needed for moderate pain.     albuterol  (VENTOLIN  HFA) 108 (90 Base) MCG/ACT inhaler Inhale 1-2 puffs into the lungs every 4 (four) hours as needed for wheezing or shortness of breath.     Alpha-Lipoic Acid 600 MG CAPS Take 600 mg by mouth in the morning.     atorvastatin  (LIPITOR ) 80 MG tablet Take 1 tablet (80 mg total) by mouth at bedtime. 90 tablet 0   benzonatate  (TESSALON ) 100 MG capsule Take 100-200 mg by mouth 3 (three) times daily as needed for cough.     cephALEXin (KEFLEX) 250 MG capsule Take 250 mg by mouth at bedtime.     cinacalcet  (SENSIPAR ) 30 MG tablet Take 30 mg by mouth 3 (three) times a week. Pt takes on Tuesdays, Thursdays, and Saturdays.     clopidogrel  (PLAVIX ) 75 MG tablet TAKE 1 TABLET BY MOUTH EVERY DAY 90 tablet 3   cyanocobalamin  (VITAMIN B12) 1000 MCG/ML injection Inject 1,000 mcg into the muscle every 30 (thirty) days.     Darbepoetin Alfa  (ARANESP ) 60 MCG/0.3ML SOSY injection Inject 0.3 mLs (60 mcg total) into the vein every Tuesday with hemodialysis. (Patient taking differently: Inject 60 mcg into the vein See admin instructions. Given at dialysis) 4.2 mL    estradiol  (ESTRACE ) 0.1 MG/GM vaginal cream Place 1 Applicatorful vaginally 3 (three) times a week.     ezetimibe  (ZETIA ) 10 MG tablet TAKE 1 TABLET BY MOUTH EVERY DAY 90 tablet 3   famotidine  (PEPCID ) 20 MG tablet Take 1 tablet (20 mg total) by mouth at bedtime. 30 tablet 2   fluticasone (FLONASE) 50 MCG/ACT nasal spray Place 2 sprays into both nostrils daily as needed for allergies or rhinitis.     furosemide  (LASIX ) 20 MG tablet  TAKE 1 TABLET ON MONDAYS,  TUESDAYS AND SATURDAYS. (Patient taking differently: at bedtime. TAKE 1 TABLET ON MONDAYS, Thursday  AND SATURDAYS.) 40 tablet 6   gabapentin  (NEURONTIN ) 100 MG capsule Take 100 mg by mouth 2 (two) times daily.     isosorbide  mononitrate (IMDUR ) 30 MG 24 hr tablet Take 2 tablets of 30 mg  ( total 60 mg ) in the morning , 30 mg  in the evening 270 tablet 3   lidocaine -prilocaine  (EMLA ) cream Apply 1 application topically Every Tuesday,Thursday,and Saturday with dialysis.     loperamide  (IMODIUM  A-D) 2 MG tablet Take 4 mg by mouth as needed for diarrhea or loose stools.     loratadine  (CLARITIN ) 10 MG tablet Take 10 mg by mouth in the morning.     magnesium  oxide (MAG-OX) 400 MG tablet Take 400 mg by mouth in the morning.     metoprolol  tartrate (LOPRESSOR ) 50 MG tablet Take 1 tablet (50 mg total) by mouth 2 (two) times daily. Take  your dose of medication  after Dialysis on  Dialysis day (Patient taking differently: Take 100 mg by mouth at bedtime. Take  your dose of medication  after Dialysis on  Dialysis day) 60 tablet 7   nitroGLYCERIN  (NITROSTAT ) 0.4 MG SL tablet PLACE 1 TABLET UNDER THE TONGUE EVERY 5 MINUTES AS NEEDED FOR CHEST PAIN. 25 tablet 7   Omega-3 Fatty Acids (FISH OIL) 1200 MG CAPS Take 2,400 mg by mouth in the morning and at bedtime.     omeprazole (PRILOSEC) 40 MG capsule Take 40 mg by mouth daily before breakfast.     Probiotic Product (ALIGN) 4 MG CAPS Take 4 mg by mouth in the morning.     promethazine  (PHENERGAN ) 25 MG tablet Take 25 mg by mouth every 6 (six) hours as needed for nausea or vomiting.     promethazine -dextromethorphan  (PROMETHAZINE -DM) 6.25-15 MG/5ML syrup Take 5 mLs by mouth 4 (four) times daily as needed for cough.     pyridOXINE  (VITAMIN B-6) 100 MG tablet Take 200 mg by mouth daily.      sevelamer  carbonate (RENVELA ) 800 MG tablet Take 800 mg by mouth See admin instructions. Take 800 mg with each meal and each snack     sodium chloride   (OCEAN) 0.65 % SOLN nasal spray Place 1 spray into both nostrils daily as needed for congestion.     No current facility-administered medications for this visit.     Review of Systems    She denies chest pain, palpitations, pnd, orthopnea, n, v, dizziness, syncope, edema, weight gain, or early satiety. All other systems reviewed and are otherwise negative except as noted above.   Physical Exam    VS:  BP 116/60   Pulse 87   Ht 5' (1.524 m)   Wt 135 lb (61.2 kg)   SpO2 94%   BMI 26.37 kg/m  GEN: Well nourished, well developed, in no acute distress. HEENT: normal. Neck: Supple, no JVD, carotid bruits, or masses. Cardiac: RRR, 2/6 murmur, no rubs, or gallops. No clubbing, cyanosis, edema.  Radials/DP/PT 2+ and equal bilaterally.  Respiratory:  Respirations regular and unlabored, clear to auscultation bilaterally. GI: Soft, nontender, nondistended, BS + x 4. MS: no deformity or atrophy. Skin: warm and dry, no rash. Neuro:  Strength and sensation are intact. Psych: Normal affect.  Accessory Clinical Findings    ECG personally reviewed by me today -    - no EKG in office today.    Lab Results  Component Value Date   WBC 6.1 04/20/2023   HGB 11.2 04/20/2023   HCT 34.6 04/20/2023   MCV 101 (H) 04/20/2023   PLT 238 04/20/2023   Lab Results  Component Value Date   CREATININE 3.18 (H) 04/20/2023   BUN 22 04/20/2023   NA 140 04/20/2023   K 5.2 04/20/2023   CL 97 04/20/2023   CO2 31 (H) 04/20/2023   Lab Results  Component Value Date   ALT 20 02/19/2022   AST 24 02/19/2022   ALKPHOS 136 (H) 02/19/2022   BILITOT 0.8 02/19/2022   Lab Results  Component Value Date   CHOL 115 02/19/2022   HDL 30 (L) 02/19/2022   LDLCALC 59 02/19/2022   TRIG 132 02/19/2022   CHOLHDL 3.8 02/19/2022    Lab Results  Component Value Date   HGBA1C 5.5 02/19/2022    Assessment & Plan   1. Mitral valve regurgitation: Most recent echo in 03/2023 showed EF 60 to 65%, normal LV function, no  RWMA,  G2 DD, elevated LVEDP, mildly reduced RV systolic function, severe eccentric mitral valve regurgitation with splay artifact directed towards the end dry atrial septum, swirling of the regurgitant jet noted in the left atrium, mild mitral stenosis.  Follow-up TEE in 04/2023 revealed 65%, moderate posterior leaflet prolapse, moderate mitral annular calcification, small ruptured cord, moderate eccentric mitral valve regurgitation, severe tricuspid valve regurgitation with severe pulmonary hypertension, 65 mmHg, mildly dilated right atrium, small PFO. She reports stable intermittent mild dyspnea on exertion. Euvolemic and well compensated on exam.  Given she is generally asymptomatic, will defer referral to structural heart team.  However, I have forwarded TEE results to Dr. Addie Holstein for review and will await his recommendations. Continue metoprolol , Lasix .  2.  CAD: S/p CABG x3 (LIMA-LAD, SVG, RPL, SVG-Circumflex) in 2012. Most recent cath in the setting of NSTEMI on 10/13/2021 revealed new total occlusion of saphenous vein graft to the obtuse marginal, chronic total occlusion of saphenous vein graft to PDA, patent LIMA to the apical LAD which was totaled proximally and distal to the insertion site, 70% left main, 70% followed by 80% proximal and mid LAD, 99% ostial circumflex, 70 to 80% right coronary artery, 95% PDA and normal LV function. Medical therapy recommended. It was noted that if symptoms persisted, PCI of the RCA could be considered.  Stable with no anginal symptoms. No indication for ischemic evaluation.  Continue aspirin , Plavix , metoprolol , Imdur , Lipitor  and Zetia .    3. Bilateral lower extremity edema:  Most recent echo as above. She reports stable intermittent mild dyspnea on exertion. Euvolemic and well compensated on exam. Continue Lasix  per nephrology (she is on a modified schedule and takes it Monday, Tuesday and Saturday only).    4. Hypertension: BP well controlled. Continue current  antihypertensive regimen.    5. Hyperlipidemia: LDL was 68 in 01/2023.  Continue Lipitor , Zetia .      6. ESRD: On HD TTS. Follows with nephrology.    7. History of TIA/L carotid bruit: No recurrence. Repeat carotid Dopplers in 03/2022 showed 1 to 39% B ICA stenosis. Continue aspirin , Plavix , Lipitor , Zetia .   8. Type 2 diabetes: A1c was 5.4 in 01/2023. Monitored and managed per PCP.     10. Disposition: Follow-up scheduled with Dr. Addie Holstein in 08/2023.      Jude Norton, NP 06/20/2023, 1:22 PM

## 2023-06-20 ENCOUNTER — Ambulatory Visit: Attending: Internal Medicine | Admitting: Nurse Practitioner

## 2023-06-20 ENCOUNTER — Encounter: Payer: Self-pay | Admitting: Nurse Practitioner

## 2023-06-20 VITALS — BP 116/60 | HR 87 | Ht 60.0 in | Wt 135.0 lb

## 2023-06-20 DIAGNOSIS — Z8673 Personal history of transient ischemic attack (TIA), and cerebral infarction without residual deficits: Secondary | ICD-10-CM

## 2023-06-20 DIAGNOSIS — I6523 Occlusion and stenosis of bilateral carotid arteries: Secondary | ICD-10-CM

## 2023-06-20 DIAGNOSIS — Z992 Dependence on renal dialysis: Secondary | ICD-10-CM

## 2023-06-20 DIAGNOSIS — R600 Localized edema: Secondary | ICD-10-CM

## 2023-06-20 DIAGNOSIS — I340 Nonrheumatic mitral (valve) insufficiency: Secondary | ICD-10-CM

## 2023-06-20 DIAGNOSIS — I361 Nonrheumatic tricuspid (valve) insufficiency: Secondary | ICD-10-CM

## 2023-06-20 DIAGNOSIS — Z951 Presence of aortocoronary bypass graft: Secondary | ICD-10-CM

## 2023-06-20 DIAGNOSIS — I10 Essential (primary) hypertension: Secondary | ICD-10-CM

## 2023-06-20 DIAGNOSIS — I2510 Atherosclerotic heart disease of native coronary artery without angina pectoris: Secondary | ICD-10-CM

## 2023-06-20 DIAGNOSIS — E1122 Type 2 diabetes mellitus with diabetic chronic kidney disease: Secondary | ICD-10-CM

## 2023-06-20 DIAGNOSIS — N186 End stage renal disease: Secondary | ICD-10-CM

## 2023-06-20 DIAGNOSIS — E785 Hyperlipidemia, unspecified: Secondary | ICD-10-CM

## 2023-06-20 NOTE — Patient Instructions (Signed)
 Medication Instructions:  Your physician recommends that you continue on your current medications as directed. Please refer to the Current Medication list given to you today.  *If you need a refill on your cardiac medications before your next appointment, please call your pharmacy*  Lab Work: NONE ordered at this time of appointment   Testing/Procedures: NONE ordered at this time of appointment    Follow-Up: At [facility], you and your health needs are our priority.  As part of our continuing mission to provide you with exceptional heart care, our providers are all part of one team.  This team includes your primary Cardiologist (physician) and Advanced Practice Providers or APPs (Physician Assistants and Nurse Practitioners) who all work together to provide you with the care you need, when you need it.  Your next appointment:    Keep f/u August 2025  Provider:   Randene Bustard, MD    We recommend signing up for the patient portal called "MyChart".  Sign up information is provided on this After Visit Summary.  MyChart is used to connect with patients for Virtual Visits (Telemedicine).  Patients are able to view lab/test results, encounter notes, upcoming appointments, etc.  Non-urgent messages can be sent to your provider as well.   To learn more about what you can do with MyChart, go to [email or web address].

## 2023-08-15 ENCOUNTER — Ambulatory Visit: Attending: Cardiology | Admitting: Cardiology

## 2023-08-15 ENCOUNTER — Encounter: Payer: Self-pay | Admitting: Cardiology

## 2023-08-15 VITALS — BP 102/60 | HR 68 | Ht 60.0 in | Wt 133.0 lb

## 2023-08-15 DIAGNOSIS — I214 Non-ST elevation (NSTEMI) myocardial infarction: Secondary | ICD-10-CM

## 2023-08-15 DIAGNOSIS — I25119 Atherosclerotic heart disease of native coronary artery with unspecified angina pectoris: Secondary | ICD-10-CM

## 2023-08-15 DIAGNOSIS — I071 Rheumatic tricuspid insufficiency: Secondary | ICD-10-CM

## 2023-08-15 DIAGNOSIS — I272 Pulmonary hypertension, unspecified: Secondary | ICD-10-CM

## 2023-08-15 DIAGNOSIS — I10 Essential (primary) hypertension: Secondary | ICD-10-CM

## 2023-08-15 DIAGNOSIS — I340 Nonrheumatic mitral (valve) insufficiency: Secondary | ICD-10-CM

## 2023-08-15 DIAGNOSIS — N186 End stage renal disease: Secondary | ICD-10-CM

## 2023-08-15 DIAGNOSIS — E785 Hyperlipidemia, unspecified: Secondary | ICD-10-CM

## 2023-08-15 DIAGNOSIS — I341 Nonrheumatic mitral (valve) prolapse: Secondary | ICD-10-CM | POA: Diagnosis not present

## 2023-08-15 DIAGNOSIS — Z951 Presence of aortocoronary bypass graft: Secondary | ICD-10-CM

## 2023-08-15 DIAGNOSIS — Z992 Dependence on renal dialysis: Secondary | ICD-10-CM

## 2023-08-15 DIAGNOSIS — I25708 Atherosclerosis of coronary artery bypass graft(s), unspecified, with other forms of angina pectoris: Secondary | ICD-10-CM

## 2023-08-15 NOTE — Progress Notes (Signed)
 Cardiology Office Note:  .   Date:  08/21/2023  ID:  Dana Jackson, DOB 09/03/1945, MRN [id number] PCP/Referring Provider: Trudy Jackson Dana Jackson  [facility] Providers Cardiologist:  Alm Clay, MD     No chief complaint on file.   Patient Profile: .     Dana Jackson is a 77 y.o. female with a complex PMH reviewed below who presents here for 97-month follow-up to discuss test results at the request of Dana Braver, NP  PMH CAD-CABG x 3 (LIMA-LAD, SVG-RPL, SVG-LCx)  NSTEMI December 2012 => cath with MV CAD referred for CABG  October 2023: Newly occluded SVG-OM, and CTO of SVG-PDA.  70% distal LM, 70% ostial LAD and sequential 80% in the proximal and mid LAD.  Heavily calcified 95-99 % ostial LCx and dominant OM.  Moderate mid RCA and 70-80% distal RCA disease, 95% PDA and 70% proximal PLV HTN, HLD, DM-2 ESRD-HD (started HD April 2021) Renal cell carcinoma s/p left nephrectomy Lung Carcinoid s/p XRT Distant h/o CVA Moderate to severe MR; severe TR    It was decided in the meeting this AM that medical therapy/monitoring is appropriate for Ms. Dana Jackson at this time. I put her in our MR and TR lists and will follow along in the background. If she has declined or doesn't feel well when you see her in August, we could see her in the fall/later when the TriClip program *should* be launched.     Tierria Jackson was last seen on 06/20/2023 by Dana Braver, NP: Doing well from a cardiac standpoint.  She notes stable occasional dyspnea on exertion. She denies any progressive dyspnea, denies chest pain, palpitations, dizziness, edema, PND, with apnea, weight gain.  Overall, she reports feeling wel   Discussed in Structural Heart Team: It was decided in the meeting this AM that medical therapy/monitoring is appropriate for Ms. Dana Jackson at this time. I put her in our MR and TR lists and will follow along in the background. If she has declined or doesn't feel well when you see her in  August, we could see her in the fall/later when the TriClip program *should* be launched.   Subjective  Discussed the use of AI scribe software for clinical note transcription with the patient, who gave verbal consent to proceed.  History of Present Illness Dana Jackson returns here today for routine follow-up to discuss results of her echocardiogram and TEE following the Structural Heart Team Meeting.  As usual, she is accompanied by her husband who provides more support  Thankfully, despite the fact that her echocardiogram shows progression of her valvular disease, she seems to doing quite well overall denying any notable heart failure symptoms.  No PND or orthopnea.  Trivial if any edema.  She has only been taking her blood pressure once a day along with Imdur  60 mg.  She takes her Lasix  Monday Tuesday and Saturday per recommendations from nephrology.  She has not had any active angina or dyspnea with rest or exertion, she is not all that active but is still trying to stay relatively active.  Not exercising in the gym vigorously, but does walking and light exercises.  She may have a little chest discomfort and shortness of breath if she really exerts herself (carrying something upstairs or trying to rush uphill or upstairs.  She denies any rapid irregular heart beats or palpitations.  No syncope or near syncope.  No TIA or amaurosis fugax. She denies any melena, hematochezia, hematuria or epistaxis.  Overall, she is in good spirits, and happy with how she feels.  ROS:  Review of Systems - Negative except Sx noted above    Objective   Current Meds  Medication Sig   atorvastatin  (LIPITOR ) 80 MG tablet Take 1 tablet (80 mg total) by mouth at bedtime.   clopidogrel  (PLAVIX ) 75 MG tablet TAKE 1 TABLET BY MOUTH EVERY DAY   ezetimibe  (ZETIA ) 10 MG tablet TAKE 1 TABLET BY MOUTH EVERY DAY   isosorbide  mononitrate (IMDUR ) 30 MG 24 hr tablet Take 2 tablets of 30 mg  ( total 60 mg ) in the  morning , 30 mg  in the evening   metoprolol  tartrate (LOPRESSOR ) 50 MG tablet Take 1 tablet (50 mg total) by mouth 2 (two) times daily. Take  your dose of medication  after Dialysis on  Dialysis day   nitroGLYCERIN  (NITROSTAT ) 0.4 MG SL tablet PLACE 1 TABLET UNDER THE TONGUE EVERY 5 MINUTES AS NEEDED FOR CHEST PAIN.   Omega-3 Fatty Acids (FISH OIL) 1200 MG CAPS Take 2,400 mg by mouth in the morning and at bedtime.    acetaminophen  (TYLENOL ) 500 MG tablet Take 1,000 mg by mouth every 6 (six) hours as needed for moderate pain.   albuterol  (VENTOLIN  HFA) 108 (90 Base) MCG/ACT inhaler Inhale 1-2 puffs into the lungs every 4 (four) hours as needed for wheezing or shortness of breath.   Alpha-Lipoic Acid 600 MG CAPS Take 600 mg by mouth in the morning.   benzonatate  (TESSALON ) 100 MG capsule Take 100-200 mg by mouth 3 (three) times daily as needed for cough.   cephALEXin (KEFLEX) 250 MG capsule Take 250 mg by mouth at bedtime.   cinacalcet  (SENSIPAR ) 30 MG tablet Take 30 mg by mouth 3 (three) times a week. Pt takes on Tuesdays, Thursdays, and Saturdays.   Darbepoetin Alfa  (ARANESP ) 60 MCG/0.3ML SOSY injection Inject 0.3 mLs (60 mcg total) into the vein Q Tuesday with HD. (Patient taking differently: Inject 60 mcg into the vein See admin instructions. Given at dialysis)   famotidine  (PEPCID ) 20 MG tablet Take 1 tablet (20 mg total) by mouth at bedtime.   fluticasone (FLONASE) 50 MCG/ACT nasal spray Place 2 sprays into both nostrils daily as needed for allergies or rhinitis.   furosemide  (LASIX ) 20 MG tablet TAKE 1 TABLET ON MONDAYS, TUESDAYS &  SATURDAYS. (Patient taking at bedtime. TAKE 1 TABLET ON MONDAYS, Thursday  AND SATURDAYS.)   gabapentin  (NEURONTIN ) 100 MG capsule Take 100 mg by mouth 2 (two) times daily.   lidocaine -prilocaine  (EMLA ) cream Apply 1 application topically Every Tuesday,Thursday,and Saturday with dialysis.   loperamide  (IMODIUM  A-D) 2 MG tablet Take 4 mg by mouth as needed for  diarrhea or loose stools.   loratadine  (CLARITIN ) 10 MG tablet Take 10 mg by mouth in the morning.   magnesium  oxide (MAG-OX) 400 MG tablet Take 400 mg by mouth in the morning.   omeprazole (PRILOSEC) 40 MG capsule Take 40 mg by mouth daily before breakfast.   Probiotic Product (ALIGN) 4 MG CAPS Take 4 mg by mouth in the morning.   promethazine  (PHENERGAN ) 25 MG tablet Take 25 mg by mouth every 6 (six) hours as needed for nausea or vomiting.   promethazine -dextromethorphan  6.25-15 MG/5ML syrup Take 5 mLs by mouth 4 (four) times daily as needed for cough.   sevelamer  carbonate (RENVELA ) 800 MG tablet Take 800 mg by mouth See admin instructions. Take 800 mg with each meal and each snack    Studies Reviewed: .  Lab Results  Component Value Date   NA 140 04/20/2023   K 5.2 04/20/2023   CREATININE 3.18 (H) 04/20/2023   EGFR 14 (L) 04/20/2023   GLUCOSE 101 (H) 04/20/2023   Lab Results  Component Value Date   CHOL 115 02/19/2022   HDL 30 (L) 02/19/2022   LDLCALC 59 02/19/2022   TRIG 132 02/19/2022   CHOLHDL 3.8 02/19/2022  Lipid Panel (Care Everywhere): TC 134, TG 201, HDL 38, LDL 68; A1c 5.4 (01/26/2023)     Latest Ref Rng & Units 04/20/2023   11:17 AM 02/19/2022    7:13 AM 02/18/2022    9:11 PM  CBC  WBC 3.4 - 10.8 x10E3/uL 6.1  5.8  6.2   Hemoglobin 11.1 - 15.9 g/dL 88.7  9.6  89.7   Hematocrit 34.0 - 46.6 % 34.6  28.4  30.6   Platelets 150 - 450 x10E3/uL 238  194  197   -------------------------------------------------------------------------------------------------------------------------------------------------------------------------------------------- ECHO: EF 60 to 65%.  No RWMA.  GR 2 DD.  Mildly disorient function.  Moderate-severe eccentric MR with splay artifact toward IAS.  Mild MS (mean gradient 5 mm with HR 81 bpm).  AOV sclerosis with no stenosis.  Appears to progression of mitral regurgitation. (04/08/2023) TEE recommended TEE: LVEF 60 to 65%.  No RWMA.  Severely  elevated PAP with RVSP estimated 68 mmHg.  No LAA thrombus noted.  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  Also noted severe TR.  Likely small PFO.  3D assessment of the MV demonstrates moderate MR and moderate MVP.(04/2023) -- Reviewed with structural heart team (Dr. Lurena Red): He suggested that on the TTE MR was worse than TR but the TEE suggested severe TR and elevated PAP with only moderate MR.  Would recommend continue to monitor but could be potential candidate for Tricuspid-Tri clip in the future.  In this case would need RHC.  -------------------------------------------------------------------------------------------------------------------------------------------------------------------------------------------- CATH: New culprit total occlusion of SVG-OM; CTO SVG-PDA. Patent LIMA - apical LAD(LAD is totally occluded proximal to the graft insertion site and in the apical segment beyond the graft and insertion site.  Native Coronaries: 70% distal LM; 70% ostial LAD and sequential 80% stenoses in the proximal and mid LAD; Ostial 95 to 99% heavily calcified circumflex with severe diffuse disease in the dominant obtuse marginal. Moderate mid RCA disease and 70 to 80% distal disease.  95% stenosis in the PDA and 70% proximal LV branch disease.  Preserved LV function with EF approximately 50 to 60% with EDP 12 mmHg. (10/2021) Dominance: Right   Risk Assessment/Calculations:          Physical Exam:   VS:  BP 102/60   Pulse 68   Ht 5' (1.524 m)   Wt 133 lb (60.3 kg)   SpO2 92%   BMI 25.97 kg/m    Wt Readings from Last 3 Encounters:  08/15/23 133 lb (60.3 kg)  06/20/23 135 lb (61.2 kg)  04/20/23 134 lb 12.8 oz (61.1 kg)    GEN: Well nourished, well groomed,  healthy appearing - in no acute distress;  NECK: No obvious JVD with trivial HJR; No carotid bruits CARDIAC: Normal S1, S2; RRR, 2/6 HSM @ apex & LLSB) murmurs, rubs, gallops RESPIRATORY:   Clear to auscultation without rales, wheezing or rhonchi ; nonlabored, good air movement. ABDOMEN: Soft, non-tender, non-distended EXTREMITIES:  No edema; No deformity     ASSESSMENT AND PLAN: .    Problem List Items Addressed This Visit  Cardiology Problems   CAD (coronary artery disease) of artery bypass graft (Chronic)   2 out of 3 grafts closed as of most recently cardiac catheterization in October 2023 Stable Class I angina with vigorous exertion. See CAD section      Relevant Medications   metoprolol  succinate (TOPROL -XL) 50 MG 24 hr tablet   Coronary artery disease involving native coronary artery of native heart with angina pectoris (HCC) (Chronic)   Notable improvement overall with higher dose of Imdur -currently taking 60 mg in the morning and 30 in the evening. Thankfully, only Class I angina symptoms with chest pain being only with vigorous exertion which is not necessarily unexpected given the fact that she has essentially occluded RCA and LCx system with severe disease in the LAD.SABRA Thankfully, she has not been requiring any nitroglycerin  dosing. Continue current dosing of Imdur -total 90 mg daily but with 60 mg in the morning and 30 at night.  Okay to take additional doses of Imdur  if she were to require PRN NTG. Continue Plavix  monotherapy 75 mg daily Continue combination of atorvastatin  80 mg, and Zetia  10 mg add fish oil 1200 mg tablets daily. Convert Lopressor  50 mg twice daily to Toprol  50 mg daily since he is only been taking the 50 mg once a day anyway.      Relevant Medications   metoprolol  succinate (TOPROL -XL) 50 MG 24 hr tablet   Essential hypertension (Chronic)   Now borderline low blood pressures.  No longer on RAAS medications. -Will convert from Lopressor  50 mg twice daily to Toprol  50 mg daily. - Continue current dosing of Imdur .      Relevant Medications   metoprolol  succinate (TOPROL -XL) 50 MG 24 hr tablet   Hyperlipidemia with target low  density lipoprotein (LDL) cholesterol less than 55 mg/dL (Chronic)   LDL last checked in January 2025 with LDL of 69.  She is already on 3 medications, trying to avoid excess medications.  Will hold off on further titration and treat for target of less than 70 labs followed by PCP. -Continue atorvastatin  80 mg daily, Zetia  10 Miller daily and fish oil 1200 mg daily.      Relevant Medications   metoprolol  succinate (TOPROL -XL) 50 MG 24 hr tablet   Mitral valve regurgitation due to prolapse of cusp - Primary (Chronic)   Moderate MVP with moderate MR based on TEE.  Thankfully, she only has NYHA class II CHF symptoms. At present, I would simply continue to treat medically with volume control by dialysis.  Continue to monitor echocardiogram-would likely recheck prior to 72-month follow-up.  (Not ordered at this time) Based on symptoms, may consider referral to structural heart team.  See note from structural heart team in body of note..      Relevant Medications   metoprolol  succinate (TOPROL -XL) 50 MG 24 hr tablet   NSTEMI (non-ST elevated myocardial infarction) (HCC) (Chronic)   Total of 2 different non-STEMI's with 1 being in December 2012 and the other being in October 2023.  Unfortunately, there were no PCI targets on the most recent cath.  No obvious PCI targets. Severe native vessel CAD-continue GDMT as tolerated: Moderate dose beta-blocker, high-dose statin plus Zetia  along with Plavix  monotherapy to maintain graft and native LAD patency.      Relevant Medications   metoprolol  succinate (TOPROL -XL) 50 MG 24 hr tablet   Pulmonary hypertension, unspecified (HCC)   Elevated PA pressures on recent echocardiograms most notably TEE.  This is consistent with severe TR on TEE.  Continue to monitor with intermittent echocardiograms and based on symptoms. Volume control by dialysis and occasional diuretic. If symptoms were to worsen, would plan for adamant about Right Heart Cath and consider  referral to structural heart for Tri clip       Relevant Medications   metoprolol  succinate (TOPROL -XL) 50 MG 24 hr tablet   Tricuspid regurgitation (Chronic)   TR noted to be more significant on TEE then MR was.  No obvious JVD noted.  Continue to monitor.  See pulm hypertension section.      Relevant Medications   metoprolol  succinate (TOPROL -XL) 50 MG 24 hr tablet     Other   ESRD on hemodialysis (HCC) (Chronic)   Volume nocturnal by dialysis.  Has had some issues with low blood pressures. Now coming back down to Toprol  50 mg daily      S/P CABG x 3 (Chronic)   Unfortunately, 2 out of 3 grafts were occluded on recent cath.  At this point I think any stress test will likely be abnormal and therefore we will hold off on further ischemic evaluation unless symptoms warrant.            Follow-Up: Return in about 6 months (around 02/15/2024) for Alternate 6 month follow-up with APP & MD.  I spent 60 minutes in the care of Dana Jackson today including reviewing labs (1 minute combined), reviewing outside labs from Care Everywhere (included 1 minute), reviewing studies (echo and TEE as well as previous Reports reviewed.  Discussed with structural heart team-9 minutes minutes), face to face time discussing treatment options (31 minutes), reviewing records from previous clinic notes from APP, structural heart team consultation based on studies (8 minutes), 11 minutes dictating, and documenting in the encounter.     Signed, Alm MICAEL Clay, MD, MS Alm Clay, M.D., M.S. Interventional Chartered certified accountant  Pager # [phone]

## 2023-08-15 NOTE — Patient Instructions (Addendum)
 Medication Instructions:  NO CHANGES   *If you need a refill on your cardiac medications before your next appointment, please call your pharmacy*   Lab Work: NOT NEEDED    Testing/Procedures:  NOT NEEDED  Follow-Up: At [facility], you and your health needs are our priority.  As part of our continuing mission to provide you with exceptional heart care, we have created designated Provider Care Teams.  These Care Teams include your primary Cardiologist (physician) and Advanced Practice Providers (APPs -  Physician Assistants and Nurse Practitioners) who all work together to provide you with the care you need, when you need it.     Your next appointment:   6 month(s)  The format for your next appointment:   In Person  Provider:   Damien Braver, NP      Then, Alm Clay, MD will plan to see you again in 12 month(s).   ADDENDUM: The plan had been to convert from metoprolol  tartrate 50 mg twice a day (which the patient is only taken once a day) to metoprolol  succinate (Toprol -XL) 50 mg daily with dinner meal. -New prescription written.

## 2023-08-21 MED ORDER — METOPROLOL SUCCINATE ER 50 MG PO TB24: 50.0000 mg | ORAL_TABLET | Freq: Every day | ORAL | 3 refills | Status: AC

## 2023-08-21 NOTE — Assessment & Plan Note (Signed)
 2 out of 3 grafts closed as of most recently cardiac catheterization in October 2023 Stable Class I angina with vigorous exertion. See CAD section

## 2023-08-21 NOTE — Assessment & Plan Note (Signed)
 Now borderline low blood pressures.  No longer on RAAS medications. -Will convert from Lopressor  50 mg twice daily to Toprol  50 mg daily. - Continue current dosing of Imdur .

## 2023-08-21 NOTE — Assessment & Plan Note (Signed)
 Volume nocturnal by dialysis.  Has had some issues with low blood pressures. Now coming back down to Toprol  50 mg daily

## 2023-08-21 NOTE — Assessment & Plan Note (Addendum)
 Notable improvement overall with higher dose of Imdur -currently taking 60 mg in the morning and 30 in the evening. Thankfully, only Class I angina symptoms with chest pain being only with vigorous exertion which is not necessarily unexpected given the fact that she has essentially occluded RCA and LCx system with severe disease in the LAD.Dana Jackson Thankfully, she has not been requiring any nitroglycerin  dosing. Continue current dosing of Imdur -total 90 mg daily but with 60 mg in the morning and 30 at night.  Okay to take additional doses of Imdur  if she were to require PRN NTG. Continue Plavix  monotherapy 75 mg daily Continue combination of atorvastatin  80 mg, and Zetia  10 mg add fish oil 1200 mg tablets daily. Convert Lopressor  50 mg twice daily to Toprol  50 mg daily since he is only been taking the 50 mg once a day anyway.

## 2023-08-21 NOTE — Assessment & Plan Note (Signed)
 TR noted to be more significant on TEE then MR was.  No obvious JVD noted.  Continue to monitor.  See pulm hypertension section.

## 2023-08-21 NOTE — Assessment & Plan Note (Signed)
 LDL last checked in January 2025 with LDL of 69.  She is already on 3 medications, trying to avoid excess medications.  Will hold off on further titration and treat for target of less than 70 labs followed by PCP. -Continue atorvastatin  80 mg daily, Zetia  10 Miller daily and fish oil 1200 mg daily.

## 2023-08-21 NOTE — Assessment & Plan Note (Signed)
 Moderate MVP with moderate MR based on TEE.  Thankfully, she only has NYHA class II CHF symptoms. At present, I would simply continue to treat medically with volume control by dialysis.  Continue to monitor echocardiogram-would likely recheck prior to 74-month follow-up.  (Not ordered at this time) Based on symptoms, may consider referral to structural heart team.  See note from structural heart team in body of note.SABRA

## 2023-08-21 NOTE — Assessment & Plan Note (Signed)
 Elevated PA pressures on recent echocardiograms most notably TEE.  This is consistent with severe TR on TEE.  Continue to monitor with intermittent echocardiograms and based on symptoms. Volume control by dialysis and occasional diuretic. If symptoms were to worsen, would plan for adamant about Right Heart Cath and consider referral to structural heart for Tri clip

## 2023-08-21 NOTE — Assessment & Plan Note (Signed)
 Total of 2 different non-STEMI's with 1 being in December 2012 and the other being in October 2023.  Unfortunately, there were no PCI targets on the most recent cath.  No obvious PCI targets. Severe native vessel CAD-continue GDMT as tolerated: Moderate dose beta-blocker, high-dose statin plus Zetia  along with Plavix  monotherapy to maintain graft and native LAD patency.

## 2023-08-21 NOTE — Assessment & Plan Note (Signed)
 Unfortunately, 2 out of 3 grafts were occluded on recent cath.  At this point I think any stress test will likely be abnormal and therefore we will hold off on further ischemic evaluation unless symptoms warrant.

## 2023-08-24 ENCOUNTER — Telehealth: Payer: Self-pay | Admitting: Cardiology

## 2023-08-24 NOTE — Telephone Encounter (Signed)
 Spoke to patient  she stated she just ordered short acting Metoprolol ,she has a full bottle.Stated at last visit Dr.Harding prescribed long acting Toprol  XL 50 mg daily.Advised ok to finish Metoprolol  Tartrate then start Toprol  50 mg daily. I will make Dr.Harding aware.

## 2023-08-24 NOTE — Telephone Encounter (Signed)
 Pt c/o medication issue:  1. Name of Medication:   metoprolol  succinate (TOPROL -XL) 50 MG 24 hr tablet    2. How are you currently taking this medication (dosage and times per day)? Take 1 tablet (50 mg total) by mouth daily. Take with or immediately following a meal. (After dinner)   3. Are you having a reaction (difficulty breathing--STAT)? No  4. What is your medication issue? Dr. Anner recommended pt take long-lasting metoprolol  once a day rather than normal twice a day, but received a refill of both the long-lasting and twice a day medication. Pt would like to know if she should be taking metoprolol  twice a day along with the long-lasting metoprolol .  Pt is at dialysis on 8/14 until 4pm, and said to contact husband at [phone] if she is unavailable.

## 2023-08-24 NOTE — Telephone Encounter (Signed)
 The plan was for her to be on 50 mg a day of Toprol .  If she has a full bottle of the 50 mg twice daily Lopressor  prescription.  We should just have her take that bottle 1/2 tablet twice daily until complete and then new prescription for should be for the Toprol  50 once daily going forward.   Alm Clay, MD

## 2023-08-26 NOTE — Telephone Encounter (Signed)
 Spoke to patient Dr.Harding's advice given.She will take Metoprolol  Tartrate 50 mg 1/2 tablet twice a day until gone then will start Toprol  XL 50 mg daily.

## 2023-09-28 IMAGING — DX DG CHEST 1V PORT
1 series · 1 of 1 positions shown · non-contrast
Comparison: Chest x-ray 12/30/2020.

CLINICAL DATA: 75-year-old female status post bronchoscopy.

EXAM:
PORTABLE CHEST 1 VIEW

[chest]
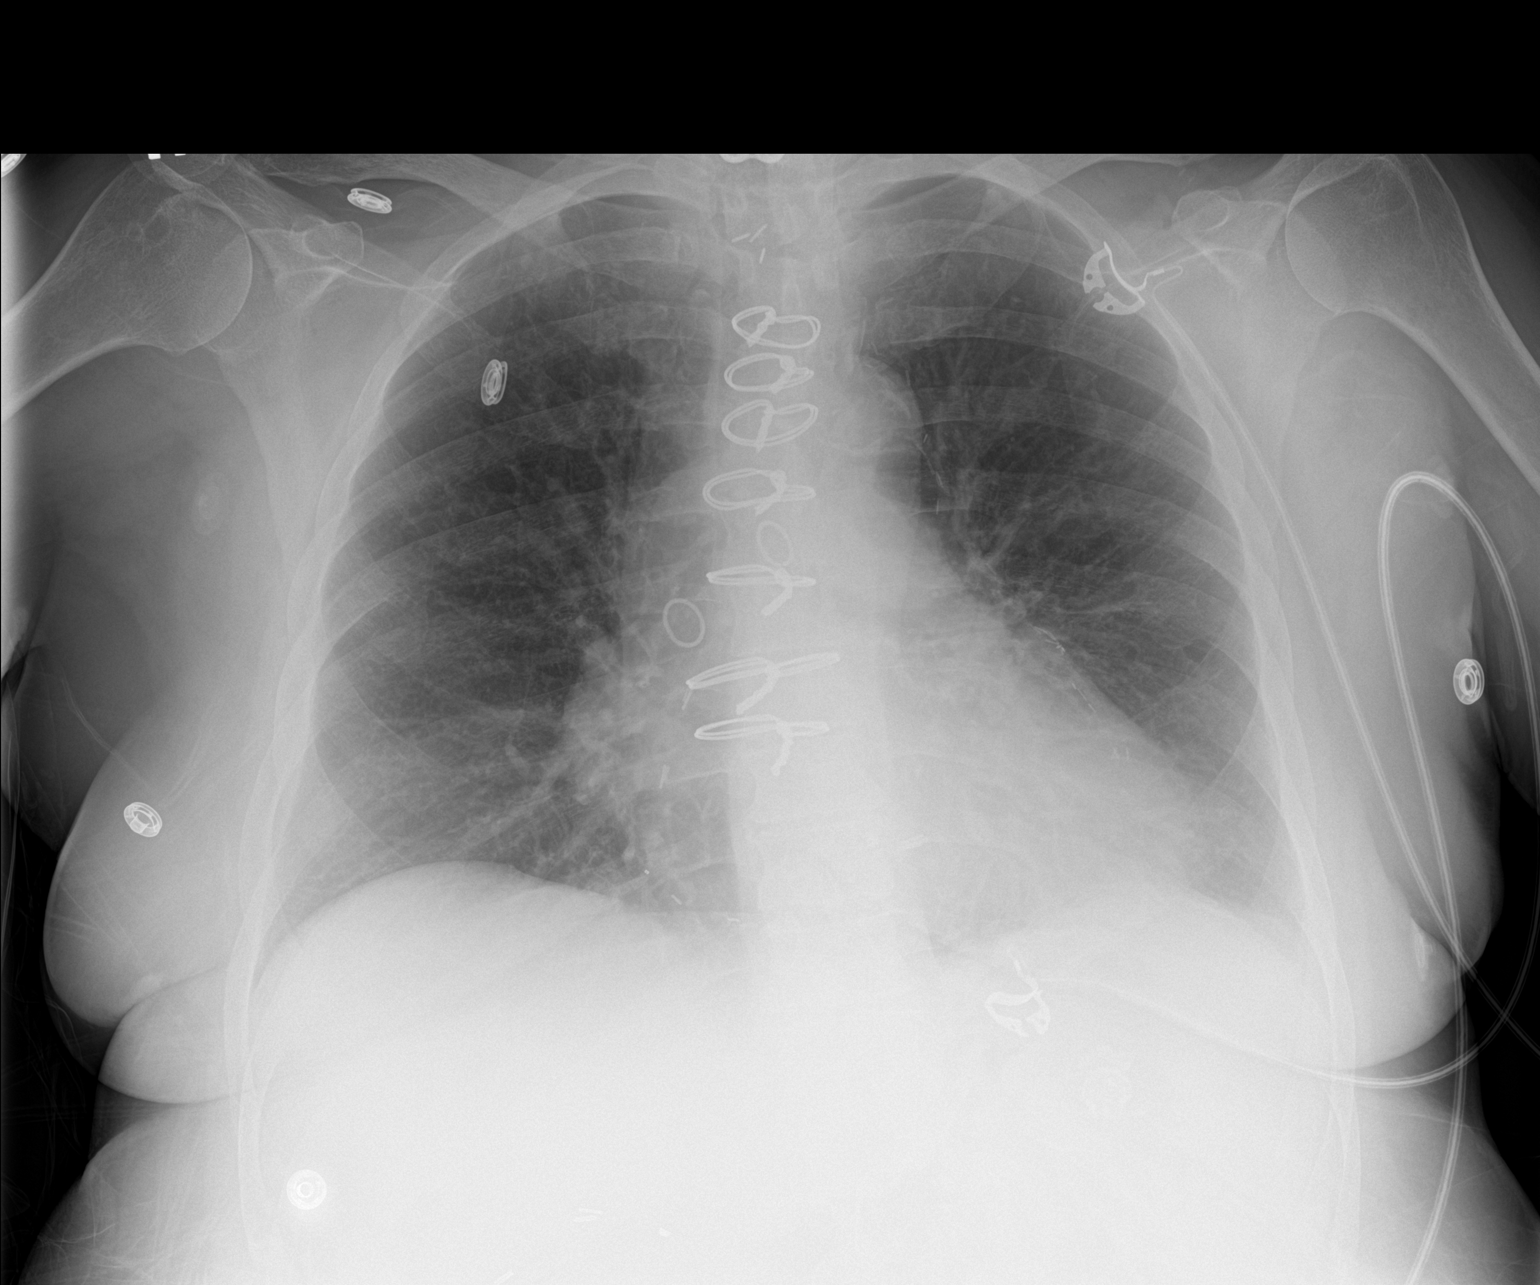

[1 of 1 positions shown; findings below may reference images not displayed]

FINDINGS: Lung volumes are normal. No consolidative airspace disease. No
pleural effusions. No pneumothorax. No pulmonary nodule or mass
noted. Pulmonary vasculature and the cardiomediastinal silhouette
are within normal limits. Atherosclerotic calcifications in the
thoracic aorta. Status post median sternotomy for CABG. Orthopedic
fixation hardware in the lower cervical spine incidentally noted.
Surgical clips project over the left mid to lower lung and the lower
mediastinum, as well as the right upper quadrant of the abdomen.
IMPRESSION: 1. No radiographic evidence of acute cardiopulmonary disease.
2. Aortic atherosclerosis.
3. Postoperative changes, as above.

## 2023-10-28 ENCOUNTER — Other Ambulatory Visit: Payer: Self-pay | Admitting: Cardiology

## 2023-10-30 ENCOUNTER — Other Ambulatory Visit: Payer: Self-pay | Admitting: Nurse Practitioner

## 2023-11-01 ENCOUNTER — Telehealth: Payer: Self-pay | Admitting: Cardiology

## 2023-11-01 NOTE — Telephone Encounter (Signed)
 RX sent in on 11/01/23

## 2023-11-01 NOTE — Telephone Encounter (Signed)
*  STAT* If patient is at the pharmacy, call can be transferred to refill team.   1. Which medications need to be refilled? (please list name of each medication and dose if known) ezetimibe (ZETIA) 10 MG tablet   2. Which pharmacy/location (including street and city if local pharmacy) is medication to be sent to? [id number] - [place], [place] - [address]   3. Do they need a 30 day or 90 day supply? 90

## 2024-02-13 ENCOUNTER — Encounter: Payer: Self-pay | Admitting: Nurse Practitioner

## 2024-02-13 ENCOUNTER — Ambulatory Visit: Admitting: Nurse Practitioner

## 2024-02-13 VITALS — BP 118/58 | HR 97 | Ht 60.0 in | Wt 138.0 lb

## 2024-02-13 DIAGNOSIS — Z992 Dependence on renal dialysis: Secondary | ICD-10-CM

## 2024-02-13 DIAGNOSIS — I361 Nonrheumatic tricuspid (valve) insufficiency: Secondary | ICD-10-CM

## 2024-02-13 DIAGNOSIS — E785 Hyperlipidemia, unspecified: Secondary | ICD-10-CM

## 2024-02-13 DIAGNOSIS — E1122 Type 2 diabetes mellitus with diabetic chronic kidney disease: Secondary | ICD-10-CM | POA: Diagnosis not present

## 2024-02-13 DIAGNOSIS — I10 Essential (primary) hypertension: Secondary | ICD-10-CM

## 2024-02-13 DIAGNOSIS — I340 Nonrheumatic mitral (valve) insufficiency: Secondary | ICD-10-CM | POA: Diagnosis not present

## 2024-02-13 DIAGNOSIS — Z951 Presence of aortocoronary bypass graft: Secondary | ICD-10-CM | POA: Diagnosis not present

## 2024-02-13 DIAGNOSIS — I6523 Occlusion and stenosis of bilateral carotid arteries: Secondary | ICD-10-CM | POA: Diagnosis not present

## 2024-02-13 DIAGNOSIS — N186 End stage renal disease: Secondary | ICD-10-CM | POA: Diagnosis not present

## 2024-02-13 DIAGNOSIS — N185 Chronic kidney disease, stage 5: Secondary | ICD-10-CM

## 2024-02-13 DIAGNOSIS — Z8673 Personal history of transient ischemic attack (TIA), and cerebral infarction without residual deficits: Secondary | ICD-10-CM

## 2024-02-13 DIAGNOSIS — I25119 Atherosclerotic heart disease of native coronary artery with unspecified angina pectoris: Secondary | ICD-10-CM

## 2024-02-13 NOTE — Patient Instructions (Signed)
 Medication Instructions:  Your physician recommends that you continue on your current medications as directed. Please refer to the Current Medication list given to you today.  *If you need a refill on your cardiac medications before your next appointment, please call your pharmacy*  Lab Work: Your physician has requested that you have an echocardiogram. Echocardiography is a painless test that uses sound waves to create images of your heart. It provides your doctor with information about the size and shape of your heart and how well your hearts chambers and valves are working. This procedure takes approximately one hour. There are no restrictions for this procedure. Please do NOT wear cologne, perfume, aftershave, or lotions (deodorant is allowed). Please arrive 15 minutes prior to your appointment time.  Please note: We ask at that you not bring children with you during ultrasound (echo/ vascular) testing. Due to room size and safety concerns, children are not allowed in the ultrasound rooms during exams. Our front office staff cannot provide observation of children in our lobby area while testing is being conducted. An adult accompanying a patient to their appointment will only be allowed in the ultrasound room at the discretion of the ultrasound technician under special circumstances. We apologize for any inconvenience.   Testing/Procedures: Your physician has requested that you have an echocardiogram. Echocardiography is a painless test that uses sound waves to create images of your heart. It provides your doctor with information about the size and shape of your heart and how well your hearts chambers and valves are working. This procedure takes approximately one hour. There are no restrictions for this procedure. Please do NOT wear cologne, perfume, aftershave, or lotions (deodorant is allowed). Please arrive 15 minutes prior to your appointment time.  Please note: We ask at that you not  bring children with you during ultrasound (echo/ vascular) testing. Due to room size and safety concerns, children are not allowed in the ultrasound rooms during exams. Our front office staff cannot provide observation of children in our lobby area while testing is being conducted. An adult accompanying a patient to their appointment will only be allowed in the ultrasound room at the discretion of the ultrasound technician under special circumstances. We apologize for any inconvenience.   Follow-Up: At [facility], you and your health needs are our priority.  As part of our continuing mission to provide you with exceptional heart care, our providers are all part of one team.  This team includes your primary Cardiologist (physician) and Advanced Practice Providers or APPs (Physician Assistants and Nurse Practitioners) who all work together to provide you with the care you need, when you need it.  Your next appointment:   6 month(s)  Provider:   Alm Clay, MD    We recommend signing up for the patient portal called MyChart.  Sign up information is provided on this After Visit Summary.  MyChart is used to connect with patients for Virtual Visits (Telemedicine).  Patients are able to view lab/test results, encounter notes, upcoming appointments, etc.  Non-urgent messages can be sent to your provider as well.   To learn more about what you can do with MyChart, go to [email or web address].   Other Instructions

## 2024-02-14 ENCOUNTER — Encounter: Payer: Self-pay | Admitting: Nurse Practitioner

## 2024-03-21 ENCOUNTER — Ambulatory Visit (HOSPITAL_COMMUNITY)
